# Patient Record
Sex: Male | Born: 1940 | Race: Black or African American | Hispanic: No | Marital: Married | Smoking: Former smoker
Health system: Southern US, Community
[De-identification: ages and names within clinical notes are randomized; demographics above are authoritative.]

## PROBLEM LIST (undated history)

## (undated) DIAGNOSIS — E119 Type 2 diabetes mellitus without complications: Secondary | ICD-10-CM

## (undated) DIAGNOSIS — M109 Gout, unspecified: Secondary | ICD-10-CM

## (undated) DIAGNOSIS — J9600 Acute respiratory failure, unspecified whether with hypoxia or hypercapnia: Secondary | ICD-10-CM

## (undated) DIAGNOSIS — N189 Chronic kidney disease, unspecified: Secondary | ICD-10-CM

## (undated) DIAGNOSIS — N183 Chronic kidney disease, stage 3 unspecified: Secondary | ICD-10-CM

## (undated) DIAGNOSIS — R0600 Dyspnea, unspecified: Secondary | ICD-10-CM

## (undated) DIAGNOSIS — J449 Chronic obstructive pulmonary disease, unspecified: Secondary | ICD-10-CM

## (undated) DIAGNOSIS — I4891 Unspecified atrial fibrillation: Secondary | ICD-10-CM

## (undated) DIAGNOSIS — K2940 Chronic atrophic gastritis without bleeding: Secondary | ICD-10-CM

## (undated) DIAGNOSIS — I509 Heart failure, unspecified: Secondary | ICD-10-CM

## (undated) DIAGNOSIS — I10 Essential (primary) hypertension: Secondary | ICD-10-CM

## (undated) DIAGNOSIS — J320 Chronic maxillary sinusitis: Secondary | ICD-10-CM

## (undated) DIAGNOSIS — K219 Gastro-esophageal reflux disease without esophagitis: Secondary | ICD-10-CM

## (undated) DIAGNOSIS — N179 Acute kidney failure, unspecified: Secondary | ICD-10-CM

## (undated) DIAGNOSIS — T7840XA Allergy, unspecified, initial encounter: Secondary | ICD-10-CM

## (undated) DIAGNOSIS — J45909 Unspecified asthma, uncomplicated: Secondary | ICD-10-CM

## (undated) DIAGNOSIS — K2970 Gastritis, unspecified, without bleeding: Secondary | ICD-10-CM

## (undated) DIAGNOSIS — E079 Disorder of thyroid, unspecified: Secondary | ICD-10-CM

## (undated) HISTORY — DX: Type 2 diabetes mellitus without complications: E11.9

## (undated) HISTORY — DX: Gout, unspecified: M10.9

## (undated) HISTORY — DX: Essential (primary) hypertension: I10

## (undated) HISTORY — DX: Chronic obstructive pulmonary disease, unspecified: J44.9

## (undated) HISTORY — DX: Allergy, unspecified, initial encounter: T78.40XA

## (undated) HISTORY — DX: Unspecified asthma, uncomplicated: J45.909

## (undated) HISTORY — DX: Gastritis, unspecified, without bleeding: K29.70

## (undated) HISTORY — DX: Chronic atrophic gastritis without bleeding: K29.40

## (undated) HISTORY — DX: Gastro-esophageal reflux disease without esophagitis: K21.9

## (undated) HISTORY — DX: Chronic maxillary sinusitis: J32.0

---

## 1978-02-05 HISTORY — PX: HERNIA REPAIR: SHX51

## 1999-08-10 ENCOUNTER — Encounter: Payer: Self-pay | Admitting: Family Medicine

## 1999-08-10 ENCOUNTER — Encounter: Admission: RE | Admit: 1999-08-10 | Discharge: 1999-08-10 | Payer: Self-pay | Admitting: Family Medicine

## 2000-06-03 ENCOUNTER — Inpatient Hospital Stay (HOSPITAL_COMMUNITY): Admission: RE | Admit: 2000-06-03 | Discharge: 2000-06-05 | Payer: Self-pay | Admitting: Urology

## 2000-06-23 ENCOUNTER — Emergency Department (HOSPITAL_COMMUNITY): Admission: EM | Admit: 2000-06-23 | Discharge: 2000-06-23 | Payer: Self-pay | Admitting: Emergency Medicine

## 2000-06-23 ENCOUNTER — Encounter: Payer: Self-pay | Admitting: Emergency Medicine

## 2000-08-03 ENCOUNTER — Emergency Department (HOSPITAL_COMMUNITY): Admission: EM | Admit: 2000-08-03 | Discharge: 2000-08-03 | Payer: Self-pay | Admitting: Emergency Medicine

## 2000-08-20 ENCOUNTER — Encounter: Admission: RE | Admit: 2000-08-20 | Discharge: 2000-08-20 | Payer: Self-pay | Admitting: Urology

## 2000-08-20 ENCOUNTER — Encounter: Payer: Self-pay | Admitting: Urology

## 2000-12-05 ENCOUNTER — Encounter: Payer: Self-pay | Admitting: *Deleted

## 2000-12-05 ENCOUNTER — Encounter: Admission: RE | Admit: 2000-12-05 | Discharge: 2000-12-05 | Payer: Self-pay | Admitting: *Deleted

## 2000-12-23 ENCOUNTER — Encounter: Admission: RE | Admit: 2000-12-23 | Discharge: 2000-12-23 | Payer: Self-pay | Admitting: *Deleted

## 2000-12-23 ENCOUNTER — Encounter: Payer: Self-pay | Admitting: *Deleted

## 2001-01-06 ENCOUNTER — Encounter: Payer: Self-pay | Admitting: Urology

## 2001-01-06 ENCOUNTER — Encounter: Admission: RE | Admit: 2001-01-06 | Discharge: 2001-01-06 | Payer: Self-pay | Admitting: Urology

## 2001-02-05 HISTORY — PX: COLONOSCOPY: SHX174

## 2010-11-03 ENCOUNTER — Encounter: Payer: Self-pay | Admitting: Gastroenterology

## 2010-11-28 ENCOUNTER — Ambulatory Visit: Payer: Self-pay | Admitting: Gastroenterology

## 2011-02-06 LAB — HM COLONOSCOPY

## 2012-06-17 ENCOUNTER — Encounter (HOSPITAL_COMMUNITY): Payer: Self-pay

## 2012-06-17 ENCOUNTER — Emergency Department (HOSPITAL_COMMUNITY)
Admission: EM | Admit: 2012-06-17 | Discharge: 2012-06-17 | Disposition: A | Discharge status: Home / Self Care | Payer: Medicare PPO | Attending: Emergency Medicine | Admitting: Emergency Medicine

## 2012-06-17 ENCOUNTER — Emergency Department (HOSPITAL_COMMUNITY): Payer: Medicare PPO

## 2012-06-17 DIAGNOSIS — Z87891 Personal history of nicotine dependence: Secondary | ICD-10-CM | POA: Insufficient documentation

## 2012-06-17 DIAGNOSIS — Z79899 Other long term (current) drug therapy: Secondary | ICD-10-CM | POA: Insufficient documentation

## 2012-06-17 DIAGNOSIS — J4489 Other specified chronic obstructive pulmonary disease: Secondary | ICD-10-CM | POA: Insufficient documentation

## 2012-06-17 DIAGNOSIS — N183 Chronic kidney disease, stage 3 unspecified: Secondary | ICD-10-CM | POA: Insufficient documentation

## 2012-06-17 DIAGNOSIS — G479 Sleep disorder, unspecified: Secondary | ICD-10-CM | POA: Insufficient documentation

## 2012-06-17 DIAGNOSIS — J329 Chronic sinusitis, unspecified: Secondary | ICD-10-CM | POA: Insufficient documentation

## 2012-06-17 DIAGNOSIS — R1013 Epigastric pain: Secondary | ICD-10-CM

## 2012-06-17 DIAGNOSIS — J449 Chronic obstructive pulmonary disease, unspecified: Secondary | ICD-10-CM | POA: Insufficient documentation

## 2012-06-17 DIAGNOSIS — I129 Hypertensive chronic kidney disease with stage 1 through stage 4 chronic kidney disease, or unspecified chronic kidney disease: Secondary | ICD-10-CM | POA: Insufficient documentation

## 2012-06-17 DIAGNOSIS — K3189 Other diseases of stomach and duodenum: Secondary | ICD-10-CM | POA: Insufficient documentation

## 2012-06-17 HISTORY — DX: Essential (primary) hypertension: I10

## 2012-06-17 LAB — CBC WITH DIFFERENTIAL/PLATELET
Basophils Absolute: 0 10*3/uL (ref 0.0–0.1)
HCT: 42.7 % (ref 39.0–52.0)
Hemoglobin: 13.3 g/dL (ref 13.0–17.0)
Lymphocytes Relative: 21 % (ref 12–46)
Lymphs Abs: 1 10*3/uL (ref 0.7–4.0)
MCV: 95.1 fL (ref 78.0–100.0)
Monocytes Absolute: 0.3 10*3/uL (ref 0.1–1.0)
Neutro Abs: 3.5 10*3/uL (ref 1.7–7.7)
RBC: 4.49 MIL/uL (ref 4.22–5.81)
RDW: 14.2 % (ref 11.5–15.5)
WBC: 5 10*3/uL (ref 4.0–10.5)

## 2012-06-17 LAB — COMPREHENSIVE METABOLIC PANEL
ALT: 22 U/L (ref 0–53)
AST: 34 U/L (ref 0–37)
CO2: 36 mEq/L — ABNORMAL HIGH (ref 19–32)
Chloride: 98 mEq/L (ref 96–112)
Creatinine, Ser: 1.39 mg/dL — ABNORMAL HIGH (ref 0.50–1.35)
GFR calc Af Amer: 56 mL/min — ABNORMAL LOW (ref >90)
GFR calc non Af Amer: 49 mL/min — ABNORMAL LOW (ref >90)
Glucose, Bld: 94 mg/dL (ref 70–99)
Total Bilirubin: 0.6 mg/dL (ref 0.3–1.2)

## 2012-06-17 LAB — URINALYSIS, ROUTINE W REFLEX MICROSCOPIC
Glucose, UA: NEGATIVE mg/dL
Hgb urine dipstick: NEGATIVE
Leukocytes, UA: NEGATIVE
Protein, ur: NEGATIVE mg/dL
Specific Gravity, Urine: 1.021 (ref 1.005–1.030)
pH: 8 (ref 5.0–8.0)

## 2012-06-17 MED ORDER — MONTELUKAST SODIUM 10 MG PO TABS: 10.0000 mg | ORAL_TABLET | Freq: Every day | ORAL | Status: DC

## 2012-06-17 MED ORDER — AMOXICILLIN 500 MG PO CAPS: 500.0000 mg | ORAL_CAPSULE | Freq: Three times a day (TID) | ORAL | Status: DC

## 2012-06-17 MED ORDER — FLUTICASONE PROPIONATE 50 MCG/ACT NA SUSP: 2.0000 | Freq: Every day | NASAL | Status: DC

## 2012-06-17 MED ORDER — ALBUTEROL SULFATE (2.5 MG/3ML) 0.083% IN NEBU: 2.5000 mg | INHALATION_SOLUTION | RESPIRATORY_TRACT | Status: DC | PRN

## 2012-06-17 MED ORDER — TRAZODONE 25 MG HALF TABLET: 50.0000 mg | ORAL_TABLET | Freq: Every day | ORAL | Status: DC

## 2012-06-17 MED ORDER — CHLORTHALIDONE 25 MG PO TABS: 25.0000 mg | ORAL_TABLET | Freq: Every day | ORAL | Status: DC

## 2012-06-17 MED ORDER — BUDESONIDE 0.5 MG/2ML IN SUSP: 0.5000 mg | Freq: Two times a day (BID) | RESPIRATORY_TRACT | Status: DC

## 2012-06-17 NOTE — ED Notes (Signed)
Pt c/o not being able to breath some dizziness, not being able to sleep and constantly belching throughout the night. Family states that the belching has gotten worse. Pt reports dizziness noticed when he walks and bends over. Pt denies chest or abdomen pain or nausea.

## 2012-06-17 NOTE — ED Provider Notes (Signed)
History     CSN: [id number]  Arrival date & time 06/17/12  0520   First MD Initiated Contact with Patient 06/17/12 0557      Chief Complaint  Patient presents with  . GI Problem    (Consider location/radiation/quality/duration/timing/severity/associated sxs/prior treatment) HPI  Omar Lambert is a 72 y.o. male  with past medical history significant for hypertension, COPD, former smoker, complaining of gas that has disturbed his sleep from belching, worsening over the course of the last 4 days. He's had this issue for years. He also has some lightheaded sensation when going from sitting to standing. Patient also reports a mild frontal headache that worsens when he bends forward. Pt has had repeat episodes of sinusitis. This feels like prior exacerbations. Patient denies chest pain, abdominal pain, nausea vomiting, change in bowel or bladder habits, fever. He recently moved here from [place] and has not established primary care. Please note that while nursing triage notes indicates a shortness of breath patient denies this to me he states that his shortness of breath is standard for him at his baseline he is compliant with his COPD medications.  Past Medical History  Diagnosis Date  . Hypertension     History reviewed. No pertinent past surgical history.  No family history on file.  History  Substance Use Topics  . Smoking status: Former Smoker    Types: Cigarettes    Quit date: 06/17/1997  . Smokeless tobacco: Not on file  . Alcohol Use: No      Review of Systems  Constitutional: Negative for fever.  Respiratory: Negative for shortness of breath.   Cardiovascular: Negative for chest pain.  Gastrointestinal: Negative for nausea, vomiting, abdominal pain and diarrhea.  Psychiatric/Behavioral: Positive for sleep disturbance.  All other systems reviewed and are negative.    Allergies  Review of patient's allergies indicates no known allergies.  Home Medications    Current Outpatient Rx  Name  Route  Sig  Dispense  Refill  . albuterol (PROVENTIL) (5 MG/ML) 0.5% nebulizer solution   Nebulization   Take 2.5 mg by nebulization 2 (two) times daily. sob         . budesonide (PULMICORT) 0.5 MG/2ML nebulizer solution   Nebulization   Take 0.5 mg by nebulization 2 (two) times daily.         . chlorthalidone (HYGROTON) 25 MG tablet   Oral   Take 25 mg by mouth daily.         . montelukast (SINGULAIR) 10 MG tablet   Oral   Take 10 mg by mouth at bedtime.         . Multiple Vitamins-Minerals (CENTRUM SILVER ADULT 50+ PO)   Oral   Take 1 tablet by mouth daily.         . sodium chloride (OCEAN) 0.65 % nasal spray   Nasal   Place 1 spray into the nose as needed for congestion (congestion).           BP 155/106  Pulse 57  Temp(Src) 98.2 F (36.8 C) (Oral)  Resp 19  SpO2 93%  Physical Exam  Nursing note and vitals reviewed. Constitutional: He is oriented to person, place, and time. He appears well-developed and well-nourished. No distress.  HENT:  Head: Normocephalic and atraumatic.  Mouth/Throat: Oropharynx is clear and moist.  Mild tenderness to palpation of bilateral frontal sinuses appear  Eyes: Conjunctivae and EOM are normal. Pupils are equal, round, and reactive to light.  Neck: Normal range  of motion. No JVD present. No tracheal deviation present.  Cardiovascular: Normal rate, regular rhythm and intact distal pulses.   Pulmonary/Chest: Effort normal and breath sounds normal. No stridor. No respiratory distress. He has no wheezes. He has no rales. He exhibits no tenderness.  Abdominal: Soft. Bowel sounds are normal. He exhibits no distension and no mass. There is no tenderness. There is no rebound and no guarding.  Musculoskeletal: Normal range of motion. He exhibits no edema.  Neurological: He is alert and oriented to person, place, and time.  Psychiatric: He has a normal mood and affect.    ED Course  Procedures  (including critical care time)  Labs Reviewed - No data to display No results found.   1. Sinusitis   2. Dyspepsia   3. COPD (chronic obstructive pulmonary disease)   4. CKD (chronic kidney disease) stage 3, GFR 30-59 ml/min       MDM   Filed Vitals:   06/17/12 0530 06/17/12 0614 06/17/12 0615 06/17/12 0619  BP: 155/106 141/85 154/95 153/90  Pulse: 57 77 111 79  Temp: 98.2 F (36.8 C)     TempSrc: Oral     Resp: 19     SpO2: 93%        Omar Lambert is a 72 y.o. male with multiple complaints including insomnia secondary to belching discomfort from gas, dizziness, headache. Patient is in no distress, vital signs are known concerning, he does have hypoxia at 93% however the patient has COPD.  Blood work shows elevated CO2 at 36 again in line with his COPD. He is also has what appears to be CK D. with a mildly elevated creatinine and a GFR of 56. Pt is stage III. Patient is not orthostatic by vital signs or by symptomatic response to standing. Acute abdominal series shows normal bowel gas, and also notes mild interstitial prominence which may be chronic versus atypical infection. I doubt that this is infection based on history of present illness and physical.  VSS and patient is appropriate for, and amenable to, discharge at this time. Pt verbalized understanding and agrees with care plan. Outpatient follow-up and return precautions given.    New Prescriptions   ALBUTEROL (PROVENTIL) (2.5 MG/3ML) 0.083% NEBULIZER SOLUTION    Take 3 mLs (2.5 mg total) by nebulization every 4 (four) hours as needed for wheezing.   AMOXICILLIN (AMOXIL) 500 MG CAPSULE    Take 1 capsule (500 mg total) by mouth 3 (three) times daily.   BUDESONIDE (PULMICORT) 0.5 MG/2ML NEBULIZER SOLUTION    Take 2 mLs (0.5 mg total) by nebulization 2 (two) times daily.   CHLORTHALIDONE (HYGROTON) 25 MG TABLET    Take 1 tablet (25 mg total) by mouth daily.   FLUTICASONE (FLONASE) 50 MCG/ACT NASAL SPRAY    Place 2 sprays  into the nose daily.   MONTELUKAST (SINGULAIR) 10 MG TABLET    Take 1 tablet (10 mg total) by mouth at bedtime.   TRAZODONE (DESYREL) 25 MG TABS    Take 1 tablet (50 mg total) by mouth at bedtime. Take 1/2-1 tablet before bedtime for insomnia           Monico Blitz, PA-C 06/17/12 [place], PA-C 06/17/12 [id number]

## 2012-06-17 NOTE — ED Notes (Signed)
Pt currently in radiology. Will stick for labs when pt returns.

## 2012-06-17 NOTE — ED Provider Notes (Signed)
Medical screening examination/treatment/procedure(s) were performed by non-physician practitioner and as supervising physician I was immediately available for consultation/collaboration.   Wynetta Fines, MD 06/17/12 2239

## 2012-09-05 DIAGNOSIS — M109 Gout, unspecified: Secondary | ICD-10-CM | POA: Insufficient documentation

## 2012-09-05 HISTORY — DX: Gout, unspecified: M10.9

## 2012-10-16 ENCOUNTER — Ambulatory Visit (INDEPENDENT_AMBULATORY_CARE_PROVIDER_SITE_OTHER): Payer: Medicare PPO | Admitting: Internal Medicine

## 2012-10-16 ENCOUNTER — Encounter: Payer: Self-pay | Admitting: Internal Medicine

## 2012-10-16 VITALS — BP 154/82 | HR 86 | Temp 98.5°F | Resp 22 | Ht 67.72 in | Wt 265.0 lb

## 2012-10-16 DIAGNOSIS — G4733 Obstructive sleep apnea (adult) (pediatric): Secondary | ICD-10-CM

## 2012-10-16 DIAGNOSIS — T7589XS Other specified effects of external causes, sequela: Secondary | ICD-10-CM

## 2012-10-16 DIAGNOSIS — J45909 Unspecified asthma, uncomplicated: Secondary | ICD-10-CM

## 2012-10-16 DIAGNOSIS — K219 Gastro-esophageal reflux disease without esophagitis: Secondary | ICD-10-CM

## 2012-10-16 DIAGNOSIS — Z9109 Other allergy status, other than to drugs and biological substances: Secondary | ICD-10-CM

## 2012-10-16 DIAGNOSIS — T7840XS Allergy, unspecified, sequela: Secondary | ICD-10-CM

## 2012-10-16 DIAGNOSIS — I10 Essential (primary) hypertension: Secondary | ICD-10-CM

## 2012-10-16 DIAGNOSIS — J329 Chronic sinusitis, unspecified: Secondary | ICD-10-CM

## 2012-10-16 DIAGNOSIS — T7840XA Allergy, unspecified, initial encounter: Secondary | ICD-10-CM | POA: Insufficient documentation

## 2012-10-16 DIAGNOSIS — J320 Chronic maxillary sinusitis: Secondary | ICD-10-CM

## 2012-10-16 DIAGNOSIS — M109 Gout, unspecified: Secondary | ICD-10-CM

## 2012-10-16 DIAGNOSIS — T788XXS Other adverse effects, not elsewhere classified, sequela: Secondary | ICD-10-CM

## 2012-10-16 MED ORDER — ACLIDINIUM BROMIDE 400 MCG/ACT IN AEPB: 1.0000 | INHALATION_SPRAY | Freq: Two times a day (BID) | RESPIRATORY_TRACT | Status: DC

## 2012-10-16 MED ORDER — OMEPRAZOLE 20 MG PO CPDR: 20.0000 mg | DELAYED_RELEASE_CAPSULE | Freq: Every day | ORAL | Status: DC

## 2012-10-16 NOTE — Progress Notes (Signed)
Patient ID: Omar Lambert, male   DOB: [birth year], 72 y.o.   MRN: [id number] Location:  [facility]  Code Status: does not yet have living will or HCPOA   No Known Allergies  Chief Complaint  Patient presents with  . Establish Care    breathing problems, stomach swelling from gas, reflux at night, gout attack in right hand    HPI: Patient is a 72 y.o. black male seen in the office today to establish with the practice.   Insurance company referred him.  Just moved from [facility] and needed to establish with a physician.  Has been to urgent care since coming here.  Is actually from [place] and they've been back 4-5 mos.  Lives with wife--she's coming on Monday.  She's in better health than he--not on meds.    Sometimes has to really take a few big breaths to get his breath if he moves a little.  Has a lot of phlegm that comes up in his throat.  Says he has never really been told what his diagnosis is.  Out of singulair for quite a while.  Has advair and is using it now.  115/21 two puffs bid.  Has pulmicort also and is using.  Has CPAP at night--has obstructive sleep apnea.   Feels tight in the chest sometimes.  No chest pains.  Had pulmonary function tests about a year ago in the summer time.  Had been to ENT in [place].  Did xrays of head and neck.  Had allergy testing and was getting shots there, as well.  Sister also has lung problems, but he is unsure if it is the same problem he has.  Quit smoking many years ago as below.  Has been using chlorthalidone--bp and swelling.  Does get swelling in legs and feet and times.    Had echocardiogram done before.  Has not had cardiac problems.    Used a few indomethacin for his swollen hand.  Still has several left.  Is still not better--does take on and off.    Gets very dizzy if bends down and stands back up.  Feels he will fall over when he ties his shoes.  Head feels funny like sinuses are infected.  Has nasal spray  for a while--doesn't feel like it's doing him much good.  Does have some headache pain.    Is belching and his stomach feels blowed up.  Can happen any time.  Has acid reflux also.  Has not been on medicine for this that he remembers.  Has tried some otc medicine--has used tums.    Right hand started to swell and he was told he had gout.  Has had prior bouts it sounds like that move around place to place--usually lasts only a few days.    Feels his memory is pretty good.     Tells me his whole left arm swelled when he got the flu shot several years ago so hasn't had since.   Did have pneumococcal vaccine before.    Review of Systems:  Review of Systems  Constitutional: Negative for weight loss.       Gained weight gradually;  Past 5-7 years has gained the weight  HENT: Positive for congestion and tinnitus. Negative for hearing loss and sore throat.        Sinus problems, globus sensation  Eyes:       Wears glasses--has eye appt within a couple of weeks  Respiratory: Positive for cough, sputum production, shortness of breath and wheezing.   Cardiovascular: Positive for orthopnea and leg swelling. Negative for chest pain, palpitations, claudication and PND.       Uses his cpap at hs  Gastrointestinal: Positive for heartburn, abdominal pain, blood in stool and melena.       Bloating;  Has had melena and hematochezia years ago--had cscope 2003  Genitourinary: Positive for urgency and frequency.       Couple times per night gets up to [facility] once with cpap usually  Musculoskeletal: Positive for joint pain. Negative for falls.  Skin: Positive for itching and rash.       Right ankle has raised area that he has noticed, not painful  Neurological: Positive for dizziness, tingling, weakness and headaches. Negative for loss of consciousness.       Some tingling of fingers at times  Endo/Heme/Allergies: Positive for environmental allergies.  Psychiatric/Behavioral: Negative for depression  and memory loss. The patient has insomnia. The patient is not nervous/anxious.        Sometimes has trouble--falling asleep primarily.      Past Medical History  Diagnosis Date  . Benign essential hypertension   . Gout attack 09/2012  . Allergy   . Asthma   . Sinusitis, maxillary, chronic     Past Surgical History  Procedure Laterality Date  . Hernia repair  1980  . Colonoscopy  2003    Social History:   reports that he quit smoking about 15 years ago. His smoking use included Cigarettes. He smoked 0.00 packs per day. He does not have any smokeless tobacco history on file. He reports that he does not drink alcohol or use illicit drugs.  Family History  Problem Relation Age of Onset  . Stroke Mother   . Cancer Sister   . COPD Sister   . COPD Sister     Medications: Patient's Medications  New Prescriptions   ACLIDINIUM BROMIDE (TUDORZA PRESSAIR) 400 MCG/ACT AEPB    Inhale 1 puff into the lungs 2 (two) times daily.   OMEPRAZOLE (PRILOSEC) 20 MG CAPSULE    Take 1 capsule (20 mg total) by mouth daily.  Previous Medications   ALBUTEROL (PROVENTIL) (2.5 MG/3ML) 0.083% NEBULIZER SOLUTION    Take 3 mLs (2.5 mg total) by nebulization every 4 (four) hours as needed for wheezing.   CHLORTHALIDONE (HYGROTON) 25 MG TABLET    Take 1 tablet (25 mg total) by mouth daily.   FLUTICASONE (FLONASE) 50 MCG/ACT NASAL SPRAY    Place 2 sprays into the nose daily.   FLUTICASONE-SALMETEROL (ADVAIR HFA) 115-21 MCG/ACT INHALER    Inhale 2 puffs into the lungs 2 (two) times daily.   INDOMETHACIN (INDOCIN) 50 MG CAPSULE    Take 50 mg by mouth 3 (three) times daily with meals.   MONTELUKAST (SINGULAIR) 10 MG TABLET    Take 1 tablet (10 mg total) by mouth at bedtime.   MULTIPLE VITAMINS-MINERALS (CENTRUM SILVER ADULT 50+ PO)    Take 1 tablet by mouth daily.  Modified Medications   No medications on file  Discontinued Medications   ALBUTEROL (PROVENTIL) (5 MG/ML) 0.5% NEBULIZER SOLUTION    Take 2.5 mg by  nebulization 2 (two) times daily. sob   AMOXICILLIN (AMOXIL) 500 MG CAPSULE    Take 1 capsule (500 mg total) by mouth 3 (three) times daily.   BUDESONIDE (PULMICORT) 0.5 MG/2ML NEBULIZER SOLUTION    Take 0.5 mg by nebulization 2 (two) times daily.  BUDESONIDE (PULMICORT) 0.5 MG/2ML NEBULIZER SOLUTION    Take 2 mLs (0.5 mg total) by nebulization 2 (two) times daily.   CHLORTHALIDONE (HYGROTON) 25 MG TABLET    Take 25 mg by mouth daily.   MONTELUKAST (SINGULAIR) 10 MG TABLET    Take 10 mg by mouth at bedtime.   SODIUM CHLORIDE (OCEAN) 0.65 % NASAL SPRAY    Place 1 spray into the nose as needed for congestion (congestion).   TRAZODONE (DESYREL) 25 MG TABS    Take 1 tablet (50 mg total) by mouth at bedtime. Take 1/2-1 tablet before bedtime for insomnia     Physical Exam: Filed Vitals:   10/16/12 0848  BP: 154/82  Pulse: 86  Temp: 98.5 F (36.9 C)  TempSrc: Oral  Resp: 22  Height: 5' 7.72" (1.72 m)  Weight: 265 lb (120.203 kg)  SpO2: 94%  Physical Exam  Constitutional: He is oriented to person, place, and time.  Obese black male, mildly distressed with moving from chair to exam table  HENT:  Head: Normocephalic and atraumatic.  Right Ear: External ear normal.  Left Ear: External ear normal.  Nose: Nose normal.  Mouth/Throat: Oropharynx is clear and moist. No oropharyngeal exudate.  Upper and lower dentures, upper with some gold present  Eyes: Conjunctivae and EOM are normal. Pupils are equal, round, and reactive to light.  Neck: Normal range of motion. Neck supple. No JVD present. No tracheal deviation present. No thyromegaly present.  Cardiovascular: Normal rate, regular rhythm, normal heart sounds and intact distal pulses.  Exam reveals no gallop and no friction rub.   No murmur heard. Pulmonary/Chest: Breath sounds normal. No respiratory distress. He has no wheezes. He has no rales. He exhibits no tenderness.  Increased effort with minimal exertion  Abdominal: Soft. Bowel sounds  are normal. He exhibits no distension and no mass. There is tenderness. No hernia.  Tenderness in periumbilical area  Musculoskeletal: Normal range of motion. He exhibits no edema and no tenderness.  1 + Pitting edema in bilateral lower extremities   Lymphadenopathy:    He has no cervical adenopathy.  Neurological: He is alert and oriented to person, place, and time. No cranial nerve deficit. He exhibits normal muscle tone. Coordination normal.  DTRs absent   Skin: Skin is warm and dry.  Right ankle with raised, dark, hard papule on lateral aspect  Psychiatric: He has a normal mood and affect. His behavior is normal. Judgment and thought content normal.     Labs reviewed: Basic Metabolic Panel:  Recent Labs  06/17/12 0628  NA 139  K 4.2  CL 98  CO2 36*  GLUCOSE 94  BUN 23  CREATININE 1.39*  CALCIUM 9.3   Liver Function Tests:  Recent Labs  06/17/12 0628  AST 34  ALT 22  ALKPHOS 39  BILITOT 0.6  PROT 7.4  ALBUMIN 4.1  CBC:  Recent Labs  06/17/12 0628  WBC 5.0  NEUTROABS 3.5  HGB 13.3  HCT 42.7  MCV 95.1  PLT 185   Past Procedures: Colonoscopy 2003  Assessment/Plan 1. GERD (gastroesophageal reflux disease) - omeprazole (PRILOSEC) 20 MG capsule; Take 1 capsule (20 mg total) by mouth daily.  Dispense: 30 capsule; Refill: 3 - CBC with Differential - Comprehensive metabolic panel  2. Asthma, chronic - need records for pulmonary function tests from [place], [place] - he is unable to say if this was his diagnosis or COPD - he did smoke in the past and has sputum production but this  seems to be more associated with his chronic sinusitis - Aclidinium Bromide (TUDORZA PRESSAIR) 400 MCG/ACT AEPB; Inhale 1 puff into the lungs 2 (two) times daily.  Dispense: 1 each; Refill: 0 -continue Singulair and Advair, may also use albuterol nebs if needed  3. Environmental allergies - likely will need to see [place] allergy for continued injections -need records from prior  allergy practice with detected allergies -cont singulair, may use nasonex, recommended nettipot for sinusitis  4. Obstructive sleep apnea -cont CPAP, will need settings from his records and confirmation that this is his diagnosis not obesity hypoventilation syndrome  5. Sinusitis, chronic - CBC with Differential - obtain records from prior PCP and ENT  6. Severe obesity (BMI >= 40) - Lipid panel; Future - Hemoglobin A1c - TSH - will need to discuss increased exercise if we are able to get his lung disease stabilized  7. Gout -provided dietary recommendations to prevent acute flares--appears he has some active swelling of the right hand at present - Uric Acid level -may need to d/c chlorthalidone due to this  8. Benign essential hypertension -will plan to adjust bp meds next visit and discuss low sodium diet and exercise  Labs/tests ordered:  Cbc, hba1c, TSH, CMP today;  flp before next visit in 6 wks Next appt:  6 wks for EV

## 2012-10-17 LAB — CBC WITH DIFFERENTIAL/PLATELET
Basophils Absolute: 0 10*3/uL (ref 0.0–0.2)
Basos: 0 % (ref 0–3)
Eos: 6 % — ABNORMAL HIGH (ref 0–5)
Eosinophils Absolute: 0.3 10*3/uL (ref 0.0–0.4)
HCT: 38.6 % (ref 37.5–51.0)
Hemoglobin: 12.3 g/dL — ABNORMAL LOW (ref 12.6–17.7)
Immature Grans (Abs): 0 10*3/uL (ref 0.0–0.1)
Immature Granulocytes: 0 % (ref 0–2)
Lymphocytes Absolute: 1 10*3/uL (ref 0.7–3.1)
Lymphs: 20 % (ref 14–46)
MCH: 28.9 pg (ref 26.6–33.0)
MCHC: 31.9 g/dL (ref 31.5–35.7)
MCV: 91 fL (ref 79–97)
Monocytes Absolute: 0.4 10*3/uL (ref 0.1–0.9)
Monocytes: 7 % (ref 4–12)
Neutrophils Absolute: 3.4 10*3/uL (ref 1.4–7.0)
Neutrophils Relative %: 67 % (ref 40–74)
RBC: 4.25 x10E6/uL (ref 4.14–5.80)
RDW: 14.2 % (ref 12.3–15.4)
WBC: 5.1 10*3/uL (ref 3.4–10.8)

## 2012-10-17 LAB — COMPREHENSIVE METABOLIC PANEL
ALT: 18 IU/L (ref 0–44)
AST: 25 IU/L (ref 0–40)
Albumin/Globulin Ratio: 1.6 (ref 1.1–2.5)
Albumin: 4.2 g/dL (ref 3.5–4.8)
Alkaline Phosphatase: 48 IU/L (ref 39–117)
BUN/Creatinine Ratio: 15 (ref 10–22)
BUN: 19 mg/dL (ref 8–27)
CO2: 33 mmol/L — ABNORMAL HIGH (ref 18–29)
Calcium: 9.3 mg/dL (ref 8.6–10.2)
Chloride: 99 mmol/L (ref 97–108)
Creatinine, Ser: 1.31 mg/dL — ABNORMAL HIGH (ref 0.76–1.27)
GFR calc Af Amer: 62 mL/min/{1.73_m2} (ref >59)
GFR calc non Af Amer: 54 mL/min/{1.73_m2} — ABNORMAL LOW (ref >59)
Globulin, Total: 2.6 g/dL (ref 1.5–4.5)
Glucose: 88 mg/dL (ref 65–99)
Potassium: 4.2 mmol/L (ref 3.5–5.2)
Sodium: 144 mmol/L (ref 134–144)
Total Bilirubin: 0.4 mg/dL (ref 0.0–1.2)
Total Protein: 6.8 g/dL (ref 6.0–8.5)

## 2012-10-17 LAB — HEMOGLOBIN A1C
Est. average glucose Bld gHb Est-mCnc: 143 mg/dL
Hgb A1c MFr Bld: 6.6 % — ABNORMAL HIGH (ref 4.8–5.6)

## 2012-10-17 LAB — URIC ACID: Uric Acid: 9.3 mg/dL — ABNORMAL HIGH (ref 3.7–8.6)

## 2012-10-17 LAB — TSH: TSH: 3.27 u[IU]/mL (ref 0.450–4.500)

## 2012-10-20 ENCOUNTER — Other Ambulatory Visit: Payer: Self-pay | Admitting: Internal Medicine

## 2012-10-20 DIAGNOSIS — I10 Essential (primary) hypertension: Secondary | ICD-10-CM

## 2012-10-20 DIAGNOSIS — M109 Gout, unspecified: Secondary | ICD-10-CM

## 2012-10-20 MED ORDER — CHLORTHALIDONE 25 MG PO TABS: 25.0000 mg | ORAL_TABLET | Freq: Every day | ORAL | Status: DC

## 2012-10-20 MED ORDER — MONTELUKAST SODIUM 10 MG PO TABS: 10.0000 mg | ORAL_TABLET | Freq: Every day | ORAL | Status: DC

## 2012-10-20 MED ORDER — COLCHICINE 0.6 MG PO TABS: 0.6000 mg | ORAL_TABLET | Freq: Every day | ORAL | Status: DC

## 2012-10-20 MED ORDER — FEBUXOSTAT 40 MG PO TABS: 40.0000 mg | ORAL_TABLET | Freq: Every day | ORAL | Status: DC

## 2012-10-21 ENCOUNTER — Other Ambulatory Visit: Payer: Self-pay | Admitting: *Deleted

## 2012-10-21 ENCOUNTER — Other Ambulatory Visit: Payer: Commercial Managed Care - HMO

## 2012-10-21 DIAGNOSIS — Z1211 Encounter for screening for malignant neoplasm of colon: Secondary | ICD-10-CM

## 2012-10-22 LAB — FECAL OCCULT BLOOD, IMMUNOCHEMICAL: Fecal Occult Bld: POSITIVE — AB

## 2012-10-23 ENCOUNTER — Telehealth: Payer: Self-pay

## 2012-10-23 DIAGNOSIS — R195 Other fecal abnormalities: Secondary | ICD-10-CM

## 2012-10-23 NOTE — Telephone Encounter (Signed)
Message copied by Logan Bores on Thu Oct 23, 2012 11:29 AM ------      Message from: [name] L      Created: Thu Oct 23, 2012  8:38 AM       Still need old records on him.  I recommend he be seen by GI here for colonoscopy if his last one has not been within the last 3 years.  Please ask him or his wife.  I would send him to [facility]. ------

## 2012-10-23 NOTE — Telephone Encounter (Signed)
Discussed with patient's wife also

## 2012-10-23 NOTE — Telephone Encounter (Signed)
Discussed results with patient, patient thinks his last colonoscopy was 3 year ago. Patient not sure if wife gave all information to request records, patient will have wife call when available. Per Dr.[name] place order for GI referral for positive hemoccult.

## 2012-10-24 ENCOUNTER — Encounter: Payer: Self-pay | Admitting: Gastroenterology

## 2012-11-18 ENCOUNTER — Encounter: Payer: Self-pay | Admitting: Gastroenterology

## 2012-11-18 ENCOUNTER — Other Ambulatory Visit (INDEPENDENT_AMBULATORY_CARE_PROVIDER_SITE_OTHER): Payer: Medicare PPO

## 2012-11-18 ENCOUNTER — Ambulatory Visit (INDEPENDENT_AMBULATORY_CARE_PROVIDER_SITE_OTHER): Payer: Medicare PPO | Admitting: Gastroenterology

## 2012-11-18 ENCOUNTER — Other Ambulatory Visit: Payer: Medicare PPO

## 2012-11-18 VITALS — BP 138/80 | HR 78 | Ht 67.0 in | Wt 256.0 lb

## 2012-11-18 DIAGNOSIS — K5900 Constipation, unspecified: Secondary | ICD-10-CM

## 2012-11-18 DIAGNOSIS — K219 Gastro-esophageal reflux disease without esophagitis: Secondary | ICD-10-CM

## 2012-11-18 DIAGNOSIS — I10 Essential (primary) hypertension: Secondary | ICD-10-CM

## 2012-11-18 DIAGNOSIS — I509 Heart failure, unspecified: Secondary | ICD-10-CM

## 2012-11-18 DIAGNOSIS — G4733 Obstructive sleep apnea (adult) (pediatric): Secondary | ICD-10-CM

## 2012-11-18 DIAGNOSIS — J449 Chronic obstructive pulmonary disease, unspecified: Secondary | ICD-10-CM

## 2012-11-18 DIAGNOSIS — I5042 Chronic combined systolic (congestive) and diastolic (congestive) heart failure: Secondary | ICD-10-CM

## 2012-11-18 LAB — CBC WITH DIFFERENTIAL/PLATELET
Basophils Relative: 0.6 % (ref 0.0–3.0)
Eosinophils Absolute: 0.2 10*3/uL (ref 0.0–0.7)
Eosinophils Relative: 5.6 % — ABNORMAL HIGH (ref 0.0–5.0)
HCT: 40.8 % (ref 39.0–52.0)
Hemoglobin: 13.3 g/dL (ref 13.0–17.0)
Lymphs Abs: 1.2 10*3/uL (ref 0.7–4.0)
MCHC: 32.5 g/dL (ref 30.0–36.0)
MCV: 90.5 fl (ref 78.0–100.0)
Monocytes Absolute: 0.6 10*3/uL (ref 0.1–1.0)
Monocytes Relative: 15.1 % — ABNORMAL HIGH (ref 3.0–12.0)
Neutro Abs: 1.7 10*3/uL (ref 1.4–7.7)
Neutrophils Relative %: 45.3 % (ref 43.0–77.0)
Platelets: 176 10*3/uL (ref 150.0–400.0)
RBC: 4.51 Mil/uL (ref 4.22–5.81)
RDW: 14.9 % — ABNORMAL HIGH (ref 11.5–14.6)
WBC: 3.7 10*3/uL — ABNORMAL LOW (ref 4.5–10.5)

## 2012-11-18 LAB — HEPATIC FUNCTION PANEL
ALT: 24 U/L (ref 0–53)
AST: 35 U/L (ref 0–37)
Albumin: 4 g/dL (ref 3.5–5.2)
Alkaline Phosphatase: 32 U/L — ABNORMAL LOW (ref 39–117)
Bilirubin, Direct: 0.1 mg/dL (ref 0.0–0.3)
Total Bilirubin: 0.8 mg/dL (ref 0.3–1.2)
Total Protein: 7.2 g/dL (ref 6.0–8.3)

## 2012-11-18 LAB — BASIC METABOLIC PANEL
BUN: 14 mg/dL (ref 6–23)
CO2: 35 mEq/L — ABNORMAL HIGH (ref 19–32)
Calcium: 9.5 mg/dL (ref 8.4–10.5)
Glucose, Bld: 87 mg/dL (ref 70–99)
Potassium: 3.8 mEq/L (ref 3.5–5.1)
Sodium: 140 mEq/L (ref 135–145)

## 2012-11-18 LAB — IBC PANEL: Saturation Ratios: 18.2 % — ABNORMAL LOW (ref 20.0–50.0)

## 2012-11-18 MED ORDER — ESOMEPRAZOLE MAGNESIUM 40 MG PO CPDR: 40.0000 mg | DELAYED_RELEASE_CAPSULE | Freq: Every day | ORAL | Status: DC

## 2012-11-18 NOTE — Progress Notes (Signed)
History of Present Illness:  This is a 72 year old African American male referred by [name] [name],DO cause for guaiac positive stool Hemoccult testing.  I have no other notes concern in his care, medications her labs.  This patient has severe COPD and is short of breath on interview.  He is accompanied by his wife.  GI-wise he complains of gas, bloating, irritation, acid reflux, chronic constipation, but no melena or hematochezia.  He is on a CPAP machine at night, and has rather severe edema of his lower extremities and shortness of breath with walking and lying flat.  I can see on reviewing his record he has not had pulmonary or cardiology evaluations, but he is on various inhalers.  He also takes daily diuretics, colchicine, and other medications for gout.  He is on Prilosec 20 mg a day for his acid reflux.  He apparently is had colonoscopy in [place] 3 years ago which was unremarkable.  This report is not available for review.  He has not had previous endoscopy.  There is no history of dysphagia, melena or hematochezia.  He denies a current neurological problems.  He denies current use of alcohol, cigarettes, or NSAIDs.  I have reviewed this patient's present history, medical and surgical past history, allergies and medications.     ROS:   All systems were reviewed and are negative unless otherwise stated in the HPI.    Physical Exam: Obese uncomfortable patient very short of breath at rest but in no severe acute distress.  Blood pressure 130/80, pulse 78, and weight 256 with a BMI of 40.09. General well developed well nourished patient in no acute distress, appearing their stated age Eyes PERRLA, no icterus, fundoscopic exam per opthamologist Skin no lesions noted Neck supple, no adenopathy, no thyroid enlargement, no tenderness,+JVD Chest : Wheezes and rhonchi on both lung fields with diminished breath sounds. Heart no significant murmurs, gallops or rubs noted Abdomen no  hepatosplenomegaly masses or tenderness, BS normal.  Massively obese and protuberant abdomen I can exam very difficult.  He appears to have an enlarged liver with positive HJ. Reflux.. probable ascites present. Rectal inspection normal no fissures, or fistulae noted.  No masses or tenderness on digital exam. Stool guaiac negative.  There is some rectal stenosis present, but there is no bloody stool in the rectal vault. Extremities no acute joint lesions, tense edema of both lower extremities with +2 pitting edema Neurologic patient oriented x 3, cranial nerves intact, no focal neurologic deficits noted. Psychological mental status normal and normal affect.  Assessment and plan: As patient has severe COPD and what appears to be associated pulmonary hypertension.  He has not, not a candidate for conscious sedation and any type of endoscopic procedure, especially EGD.  He has chronic acid reflux without dysphagia, and I have changed her from Prilosec to Dexilant 60 mg a day.  He needs pulmonary and cardiology consultation for better management of his COPD, congestive heart failure, and possible secondary congestive hepatic cirrhosis.  I have ordered labs for review, and schedule appropriate consultations.  Have also ordered a baseline CT scan of the abdomen and pelvis.  This patient may need hospitalization for further workup.  I am not sure that guaiac positive stool is of main concern on this patient less CT scan shows some specific abnormality since he's had fairly recent colonoscopy which was unremarkable and as multiple reasons to have a guaiac positive stool although I but could not confirm this.  He needs  primary medical attention before any GI evaluation otherwise is completed.  History is also positive for chronic sleep apnea.

## 2012-11-18 NOTE — Patient Instructions (Addendum)
You have been scheduled for a CT scan of the abdomen and pelvis at [facility] ([address]---this is in the same building as [facility]).   You are scheduled on 11-21-2012 at 9 am. You should arrive 15 minutes prior to your appointment time for registration. Please follow the written instructions below on the day of your exam:  WARNING: IF YOU ARE ALLERGIC TO IODINE/X-RAY DYE, PLEASE NOTIFY RADIOLOGY IMMEDIATELY AT [phone]! YOU WILL BE GIVEN A 13 HOUR PREMEDICATION PREP.  1) Do not eat or drink anything after 5 am (4 hours prior to your test) 2) You have been given 2 bottles of oral contrast to drink. The solution may taste better if refrigerated, but do NOT add ice or any other liquid to this solution. Shake well before drinking.    Drink 1 bottle of contrast @ 7 am (2 hours prior to your exam)  Drink 1 bottle of contrast @ 8 am (1 hour prior to your exam)  You may take any medications as prescribed with a small amount of water except for the following: Metformin, Glucophage, Glucovance, Avandamet, Riomet, Fortamet, Actoplus Met, Janumet, Glumetza or Metaglip. The above medications must be held the day of the exam AND 48 hours after the exam.  The purpose of you drinking the oral contrast is to aid in the visualization of your intestinal tract. The contrast solution may cause some diarrhea. Before your exam is started, you will be given a small amount of fluid to drink. Depending on your individual set of symptoms, you may also receive an intravenous injection of x-ray contrast/dye. Plan on being at [facility] for 30 minutes or long, depending on the type of exam you are having performed.  This test typically takes 30-45 minutes to complete.  If you have any questions regarding your exam or if you need to reschedule, you may call the CT department at [phone] between the hours of 8:00 am and 5:00 pm,  Monday-Friday.  ______________________________________________________________________________________________________________________________________________________________________________________________  Your physician has requested that you go to the basement for the following lab work before leaving today: BMP Anemia Panel TSH CBC Liver Function Panel    Please stop Omeprazole and start Nexium 40 mg, please take one capsule by mouth once daily.   You have an appointment with Pulmonary on our 2nd floor of this building on 11-24-2012 at 10:45 am  You have an appointment with [facility] on 11-25-2012 at 345 pm ([place])

## 2012-11-19 LAB — VITAMIN B12: Vitamin B-12: 261 pg/mL (ref 211–911)

## 2012-11-20 ENCOUNTER — Telehealth: Payer: Self-pay | Admitting: *Deleted

## 2012-11-20 DIAGNOSIS — R140 Abdominal distension (gaseous): Secondary | ICD-10-CM

## 2012-11-20 DIAGNOSIS — K625 Hemorrhage of anus and rectum: Secondary | ICD-10-CM

## 2012-11-20 DIAGNOSIS — K219 Gastro-esophageal reflux disease without esophagitis: Secondary | ICD-10-CM

## 2012-11-20 NOTE — Telephone Encounter (Signed)
Spoke with nurse at [facility] for approval of CT Scan scheduled in am. The nurse stated if pt had had an U/S or AFP level done it could have been approved.  CT was r/s to 11/25/12 hoping we will have approval after Dr Sharlett Iles does a peer to peer. Someone from [facility] will call Dr Sharlett Iles within 24-48 hours.

## 2012-11-21 ENCOUNTER — Other Ambulatory Visit: Payer: Medicare PPO

## 2012-11-21 NOTE — Telephone Encounter (Signed)
Ultrasound is ok

## 2012-11-21 NOTE — Telephone Encounter (Signed)
Informed pt insurance will not pay for his CT scan and Dr Sharlett Iles has ordered an U/S. Pt was riding and could not write down the appt; I will call this pm after his wife gets home from work. Pt stated understanding.  Mailed pt a letter with appt and asked Dr Gustavus Bryant ofc to print my letter in case the pt doesn't receive it in time.

## 2012-11-21 NOTE — Telephone Encounter (Signed)
Pt was home and had something to write with so I gave him info on his CT scan; he stated understanding.

## 2012-11-24 ENCOUNTER — Encounter: Payer: Self-pay | Admitting: Internal Medicine

## 2012-11-24 ENCOUNTER — Ambulatory Visit (INDEPENDENT_AMBULATORY_CARE_PROVIDER_SITE_OTHER): Payer: Medicare PPO | Admitting: Internal Medicine

## 2012-11-24 VITALS — BP 128/80 | HR 78 | Temp 98.4°F | Ht 68.0 in | Wt 254.0 lb

## 2012-11-24 DIAGNOSIS — J449 Chronic obstructive pulmonary disease, unspecified: Secondary | ICD-10-CM

## 2012-11-24 MED ORDER — FLUTICASONE FUROATE-VILANTEROL 100-25 MCG/INH IN AEPB: 1.0000 | INHALATION_SPRAY | Freq: Every morning | RESPIRATORY_TRACT | Status: DC

## 2012-11-24 NOTE — Patient Instructions (Addendum)
Breo one puff each am and stop all other respiratory medications  Only use your albuterol as a rescue medication to be used if you can't catch your breath by resting or doing a relaxed purse lip breathing pattern.  - The less you use it, the better it will work when you need it. - Ok to use up to every 4 hours if you must but call for immediate appointment if use goes up over your usual need  Bring all active medications to each visit separated into two bags : the ones you take automatically vs the ones you take as needed  Please schedule a follow up office visit in 4 weeks, sooner if needed with pfts  And cxr on return

## 2012-11-24 NOTE — Progress Notes (Signed)
  Subjective:    Patient ID: Omar Lambert, male    DOB: 1940-07-22   MRN: [id number]  HPI  57 yobm quit smoking 1999 at wt 200 lb with onset of breathing problems around 2005 but worse since so referred by [name] for evaluation of copd   11/24/2012 1st [facility] office visit/ Wert cc indolent onset progresssively worsening doe to point where only goes about  100 ft ? No better p saba no cough, breathing worse bending over. No better on advair hfa  No obvious day to day or daytime variabilty or assoc chronic cough or cp or chest tightness, subjective wheeze overt   hb symptoms. No unusual exp hx or h/o childhood pna/ asthma or knowledge of premature birth.  Sleeping ok without nocturnal  or early am exacerbation  of respiratory  c/o's or need for noct saba. Also denies any obvious fluctuation of symptoms with weather or environmental changes or other aggravating or alleviating factors except as outlined above   Current Medications, Allergies, Complete Past Medical History, Past Surgical History, Family History, and Social History were reviewed in [employer] record.         Review of Systems  Constitutional: Negative for fever and unexpected weight change.  HENT: Positive for sinus pressure. Negative for congestion, dental problem, ear pain, nosebleeds, postnasal drip, rhinorrhea, sneezing, sore throat and trouble swallowing.   Eyes: Negative for redness and itching.  Respiratory: Positive for shortness of breath. Negative for cough, chest tightness and wheezing.   Cardiovascular: Positive for leg swelling. Negative for palpitations.  Gastrointestinal: Negative for nausea and vomiting.  Genitourinary: Negative for dysuria.  Musculoskeletal: Negative for joint swelling.  Skin: Negative for rash.  Neurological: Positive for headaches.  Hematological: Does not bruise/bleed easily.  Psychiatric/Behavioral: Positive for dysphoric mood. The patient is  nervous/anxious.        Objective:   Physical Exam  Wt Readings from Last 3 Encounters:  11/24/12 254 lb (115.214 kg)  11/18/12 256 lb (116.121 kg)  10/16/12 265 lb (120.203 kg)     Full dentures  HEENT mild turbinate edema.  Oropharynx no thrush or excess pnd or cobblestoning.  No JVD or cervical adenopathy. Mild accessory muscle hypertrophy. Trachea midline, nl thryroid. Chest was hyperinflated by percussion with diminished breath sounds and moderate increased exp time without wheeze. Hoover sign positive at mid inspiration. Regular rate and rhythm without murmur gallop or rub or increase P2 but 1-2 plus sym bilateral lower ext edema.  Abd: no hsm, nl excursion. Ext warm without cyanosis or clubbing.         Assessment & Plan:

## 2012-11-25 ENCOUNTER — Ambulatory Visit (HOSPITAL_COMMUNITY)
Admission: RE | Admit: 2012-11-25 | Discharge: 2012-11-25 | Disposition: A | Discharge status: Home / Self Care | Payer: Medicare PPO | Source: Ambulatory Visit | Attending: Gastroenterology | Admitting: Gastroenterology

## 2012-11-25 ENCOUNTER — Other Ambulatory Visit: Payer: Medicare PPO

## 2012-11-25 ENCOUNTER — Ambulatory Visit (INDEPENDENT_AMBULATORY_CARE_PROVIDER_SITE_OTHER): Payer: Medicare PPO | Admitting: Cardiovascular Disease

## 2012-11-25 VITALS — BP 110/80 | HR 75 | Ht 67.0 in | Wt 251.8 lb

## 2012-11-25 DIAGNOSIS — R0602 Shortness of breath: Secondary | ICD-10-CM

## 2012-11-25 DIAGNOSIS — R141 Gas pain: Secondary | ICD-10-CM | POA: Insufficient documentation

## 2012-11-25 DIAGNOSIS — Z79899 Other long term (current) drug therapy: Secondary | ICD-10-CM

## 2012-11-25 DIAGNOSIS — G4733 Obstructive sleep apnea (adult) (pediatric): Secondary | ICD-10-CM

## 2012-11-25 DIAGNOSIS — K625 Hemorrhage of anus and rectum: Secondary | ICD-10-CM

## 2012-11-25 DIAGNOSIS — N281 Cyst of kidney, acquired: Secondary | ICD-10-CM | POA: Insufficient documentation

## 2012-11-25 DIAGNOSIS — Z13220 Encounter for screening for lipoid disorders: Secondary | ICD-10-CM

## 2012-11-25 DIAGNOSIS — M7989 Other specified soft tissue disorders: Secondary | ICD-10-CM

## 2012-11-25 DIAGNOSIS — R142 Eructation: Secondary | ICD-10-CM | POA: Insufficient documentation

## 2012-11-25 DIAGNOSIS — K219 Gastro-esophageal reflux disease without esophagitis: Secondary | ICD-10-CM

## 2012-11-25 DIAGNOSIS — R140 Abdominal distension (gaseous): Secondary | ICD-10-CM

## 2012-11-25 MED ORDER — TORSEMIDE 20 MG PO TABS: ORAL_TABLET | ORAL | Status: DC

## 2012-11-25 NOTE — Patient Instructions (Addendum)
Your physician has recommended you make the following change in your medication: start new prescription for torsemide as directed. This has already been sent to the pharmacy. STOP the chlorthalidone.  Your physician has requested that you have an echocardiogram. Echocardiography is a painless test that uses sound waves to create images of your heart. It provides your doctor with information about the size and shape of your heart and how well your heart's chambers and valves are working. This procedure takes approximately one hour. There are no restrictions for this procedure.   Your physician recommends that you return for lab work in: [place].  Your physician has requested that you have a lower extremity arterial exercise duplex. During this test, exercise and ultrasound are used to evaluate arterial blood flow in the legs. Allow one hour for this exam. There are no restrictions or special instructions.  Your physician recommends that you schedule a follow-up appointment in: 4 WEEKS

## 2012-11-26 DIAGNOSIS — J441 Chronic obstructive pulmonary disease with (acute) exacerbation: Secondary | ICD-10-CM | POA: Insufficient documentation

## 2012-11-26 NOTE — Assessment & Plan Note (Addendum)
DDX of  difficult airways managment all start with A and  include Adherence, Ace Inhibitors, Acid Reflux, Active Sinus Disease, Alpha 1 Antitripsin deficiency, Anxiety masquerading as Airways dz,  ABPA,  allergy(esp in young), Aspiration (esp in elderly), Adverse effects of DPI,  Active smokers, plus two Bs  = Bronchiectasis and Beta blocker use..and one C= CHF  Adherence is always the initial "prime suspect" and is a multilayered concern that requires a "trust but verify" approach in every patient - starting with knowing how to use medications, especially inhalers, correctly, keeping up with refills and understanding the fundamental difference between maintenance and prns vs those medications only taken for a very short course and then stopped and not refilled. I have asked him to start by separating maint vs prn   He does not appear to know his resp meds well at all re maint vs prns so the simpler the better to maintain adherence: try breo qam and prn saba only.  ? Acid (or non-acid) GERD > always difficult to exclude as up to 75% of pts in some series report no assoc GI/ Heartburn symptoms> rec continue  max (24h)  acid suppression and diet restrictions/ reviewed    ? CHF > cards eval in progress    Each maintenance medication was reviewed in detail including most importantly the difference between maintenance and as needed and under what circumstances the prns are to be used.  Please see instructions for details which were reviewed in writing and the patient given a copy.

## 2012-11-27 ENCOUNTER — Other Ambulatory Visit: Payer: Medicare PPO

## 2012-12-01 ENCOUNTER — Encounter: Payer: Medicare PPO | Admitting: Internal Medicine

## 2012-12-03 ENCOUNTER — Ambulatory Visit (HOSPITAL_COMMUNITY)
Admission: RE | Admit: 2012-12-03 | Discharge: 2012-12-03 | Disposition: A | Discharge status: Home / Self Care | Payer: Medicare HMO | Source: Ambulatory Visit | Attending: Cardiovascular Disease | Admitting: Cardiovascular Disease

## 2012-12-03 ENCOUNTER — Other Ambulatory Visit (HOSPITAL_COMMUNITY): Payer: Self-pay | Admitting: Cardiovascular Disease

## 2012-12-03 ENCOUNTER — Other Ambulatory Visit: Payer: Self-pay | Admitting: *Deleted

## 2012-12-03 DIAGNOSIS — M79609 Pain in unspecified limb: Secondary | ICD-10-CM | POA: Insufficient documentation

## 2012-12-03 DIAGNOSIS — M7989 Other specified soft tissue disorders: Secondary | ICD-10-CM

## 2012-12-03 NOTE — Progress Notes (Signed)
Lower Ext. Venous Duplex Completed. Negative for DVT - Positive for bilateral deep venous insufficiency. Oda Cogan, BS, RDMS, RVT

## 2012-12-08 ENCOUNTER — Telehealth: Payer: Self-pay | Admitting: *Deleted

## 2012-12-08 NOTE — Telephone Encounter (Signed)
Faxed sleep studies and office notes from Dr. Garnet Koyanagi in [place], [place].

## 2012-12-12 ENCOUNTER — Encounter: Payer: Self-pay | Admitting: Cardiovascular Disease

## 2012-12-17 ENCOUNTER — Ambulatory Visit (HOSPITAL_COMMUNITY)
Admission: RE | Admit: 2012-12-17 | Discharge: 2012-12-17 | Disposition: A | Discharge status: Home / Self Care | Payer: Medicare HMO | Source: Ambulatory Visit | Attending: Cardiology | Admitting: Cardiology

## 2012-12-17 DIAGNOSIS — I379 Nonrheumatic pulmonary valve disorder, unspecified: Secondary | ICD-10-CM | POA: Insufficient documentation

## 2012-12-17 DIAGNOSIS — R0989 Other specified symptoms and signs involving the circulatory and respiratory systems: Secondary | ICD-10-CM | POA: Insufficient documentation

## 2012-12-17 DIAGNOSIS — R0602 Shortness of breath: Secondary | ICD-10-CM

## 2012-12-17 DIAGNOSIS — R609 Edema, unspecified: Secondary | ICD-10-CM | POA: Insufficient documentation

## 2012-12-17 DIAGNOSIS — I079 Rheumatic tricuspid valve disease, unspecified: Secondary | ICD-10-CM | POA: Insufficient documentation

## 2012-12-17 DIAGNOSIS — R0609 Other forms of dyspnea: Secondary | ICD-10-CM | POA: Insufficient documentation

## 2012-12-17 NOTE — Progress Notes (Signed)
2D Echo Performed 12/17/2012    Marygrace Drought, RCS

## 2012-12-22 ENCOUNTER — Encounter: Payer: Self-pay | Admitting: Cardiovascular Disease

## 2012-12-22 DIAGNOSIS — G4733 Obstructive sleep apnea (adult) (pediatric): Secondary | ICD-10-CM | POA: Insufficient documentation

## 2012-12-22 NOTE — Progress Notes (Signed)
Patient ID: Omar Lambert, male   DOB: [birth year], 72 y.o.   MRN: [id number]     PATIENT PROFILE: [name] is a 72 year old African American male with history of severe COPD, dyspnea GERD, as well as obstructive sleep apnea he presents for evaluation of shortness of breath and cardiac status.   HPI: Mr. Church has remote tobacco history having quit in 1999. He recently had returned to [place] and living in [place]. Apparently, he has seen Dr. [name] with complaints of gas, abdominal bloating, acid reflux, chronic constipation without melena or hematochezia. There was some concern about potential hepatic congestion from cardiac status. He has seen Dr. Melvyn Novas for evaluation of his severe COPD. Patient admits to shortness of breath with minimal activity. He has a history of obstructive sleep apnea but admits to only intermittently using CPAP therapy. He denies any chest pressure. He denies any awareness of significant cardiac arrhythmias, presyncope or syncope.  Past Medical History  Diagnosis Date  . Benign essential hypertension   . Gout attack 09/2012  . Allergy   . Asthma   . Sinusitis, maxillary, chronic     Past Surgical History  Procedure Laterality Date  . Hernia repair  1980  . Colonoscopy  2003    No Known Allergies  Current Outpatient Prescriptions  Medication Sig Dispense Refill  . ADVAIR HFA 115-21 MCG/ACT inhaler Inhale 2 puffs into the lungs daily.      Marland Kitchen albuterol (PROVENTIL) (2.5 MG/3ML) 0.083% nebulizer solution Take 3 mLs (2.5 mg total) by nebulization every 4 (four) hours as needed for wheezing.  30 vial  1  . colchicine 0.6 MG tablet Take 1 tablet (0.6 mg total) by mouth daily.  30 tablet  3  . esomeprazole (NEXIUM) 40 MG capsule Take 1 capsule (40 mg total) by mouth daily before breakfast.  30 capsule  6  . febuxostat (ULORIC) 40 MG tablet Take 1 tablet (40 mg total) by mouth daily.  30 tablet  3  . fluticasone (FLONASE) 50 MCG/ACT nasal  spray Place 2 sprays into the nose daily.  16 g  0  . Fluticasone Furoate-Vilanterol (BREO ELLIPTA) 100-25 MCG/INH AEPB Inhale 1 puff into the lungs every morning.      . montelukast (SINGULAIR) 10 MG tablet Take 1 tablet (10 mg total) by mouth at bedtime.  30 tablet  3  . Multiple Vitamins-Minerals (CENTRUM SILVER ADULT 50+ PO) Take 1 tablet by mouth daily.      Marland Kitchen torsemide (DEMADEX) 20 MG tablet Take 1 tablet twice daily  60 tablet  6   No current facility-administered medications for this visit.    Social history is notable in that he is married for 55 years. He has 3 children 8 grandchildren 6 great-grandchildren. He is retired from [employer]. He completed ninth grade education. He smoked until 1999.  Family History  Problem Relation Age of Onset  . Stroke Mother   . Cancer Sister   . Diabetes Sister   . COPD Sister   . COPD Sister     ROS is negative for fever chills or night sweats. He denies skin changes. At times he admits to have having sinus pressure. He does have dentures he denies cough. He denies recent wheezing. He denies chest pressure. He is unaware of any arrhythmias. At times he notes abdominal bloating. He denies recent awareness of blood in the stool. He denies GU symptoms. He admits to  leg swelling. He does experience occasional  headaches. He denies bleeding. From a sleep prospective he typically goes to bed at 9 PM and wakes up at 4 AM. He is unaware of any restless legs. He denies heat or cold intolerance. There is no history of diabetes. Other comprehensive 14 point system review is negative.  PE BP 110/80  Pulse 75  Ht 5\' 7"  (1.702 m)  Wt 251 lb 12.8 oz (114.216 kg)  BMI 39.43 kg/m2 General: Alert, oriented, no distress.  Skin: normal turgor, no rashes HEENT: Normocephalic, atraumatic. Pupils round and reactive; sclera anicteric; Fundi mild arteriolar narrowing. No hemorrhages or exudates. Nose without nasal septal hypertrophy Mouth/Parynx benign;  Mallinpatti scale 4 Neck: No JVD, no carotid bruits Lungs: clear to ausculatation and percussion; no wheezing or rales Heart: RRR, s1 s2 normal  Abdomen: Diastases recti; soft, nontender; no hepatosplenomehaly, BS+; abdominal aorta nontender and not dilated by palpation. Pulses 2+ Extremities: 1+ lower extremity edema no clubbinbg cyanosis, Homan's sign negative  Neurologic: grossly nonfocal Psychologic: Mild nervousness   ECG: Sinus rhythm with first-degree AV block with occasional PVC. The patient is entirely asymptomatic with reference to this ectopy.  LABS:  BMET    Component Value Date/Time   NA 140 11/18/2012 1459   NA 144 10/16/2012 1047   K 3.8 11/18/2012 1459   CL 98 11/18/2012 1459   CO2 35* 11/18/2012 1459   GLUCOSE 87 11/18/2012 1459   GLUCOSE 88 10/16/2012 1047   BUN 14 11/18/2012 1459   BUN 19 10/16/2012 1047   CREATININE 1.3 11/18/2012 1459   CALCIUM 9.5 11/18/2012 1459   GFRNONAA 54* 10/16/2012 1047   GFRAA 62 10/16/2012 1047     Hepatic Function Panel     Component Value Date/Time   PROT 7.2 11/18/2012 1459   PROT 6.8 10/16/2012 1047   ALBUMIN 4.0 11/18/2012 1459   AST 35 11/18/2012 1459   ALT 24 11/18/2012 1459   ALKPHOS 32* 11/18/2012 1459   BILITOT 0.8 11/18/2012 1459   BILIDIR 0.1 11/18/2012 1459     CBC    Component Value Date/Time   WBC 3.7* 11/18/2012 1459   WBC 5.1 10/16/2012 1047   RBC 4.51 11/18/2012 1459   RBC 4.25 10/16/2012 1047   HGB 13.3 11/18/2012 1459   HCT 40.8 11/18/2012 1459   PLT 176.0 11/18/2012 1459   MCV 90.5 11/18/2012 1459   MCH 28.9 10/16/2012 1047   MCH 29.6 06/17/2012 0628   MCHC 32.5 11/18/2012 1459   MCHC 31.9 10/16/2012 1047   RDW 14.9* 11/18/2012 1459   RDW 14.2 10/16/2012 1047   LYMPHSABS 1.2 11/18/2012 1459   LYMPHSABS 1.0 10/16/2012 1047   MONOABS 0.6 11/18/2012 1459   EOSABS 0.2 11/18/2012 1459   EOSABS 0.3 10/16/2012 1047   BASOSABS 0.0 11/18/2012 1459   BASOSABS 0.0 10/16/2012 1047     BNP No results  found for this basename: probnp    Lipid Panel  No results found for this basename: chol, trig, hdl, cholhdl, vldl, ldlcalc     RADIOLOGY: US Abdomen Complete  11/25/2012   CLINICAL DATA:  Abdominal distention  EXAM: ULTRASOUND ABDOMEN COMPLETE  COMPARISON:  None.  FINDINGS: Gallbladder  No gallstones or wall thickening visualized. No sonographic Murphy sign noted.  Common bile duct  Diameter: 4.2 mm  Liver  No focal lesion identified. Within normal limits in parenchymal echogenicity.  IVC  No abnormality visualized.  Pancreas  Visualized portion unremarkable.  Spleen  Size and appearance within normal limits.  Right Kidney  Length: 9.8  cm in length. A 1.8 cm cyst is noted in the parapelvic region.  Left Kidney  Length: 10.7 cm in length. Echogenicity within normal limits. No mass or hydronephrosis visualized.  Abdominal aorta  No aneurysm visualized.  IMPRESSION: Right renal cyst. No other focal abnormality is noted.   Electronically Signed   By: Inez Catalina M.D.   On: 11/25/2012 08:12     ASSESSMENT AND PLAN:  Mr. Dekota Wasley is a 72 year old African American male with a long prior tobacco history but quit approximately 15 years ago. He has a history of hypertension, and also has experienced leg edema. He does have abdominal bloating there has been some question raised about right heart function and potential hepatic congestion. I'm scheduling him to undergo a 2-D echo Doppler study for further evaluation of systolic and diastolic function and right heart pressures. Also scheduling him for a lower extremity venous insufficiency study. I'm recommending he discontinue chlorthalidone in its place start torsemide 20 mg twice a day. I will recheck his comprehensive metabolic panel and lipid panel in 2 weeks. We will need to obtain information with reference to his [employer] company so that we can download his CPAP unit to assess adequacy of therapy. I'll see him back in the office in 4 weeks for further  recommendations and evaluation.   Troy Sine, MD, [facility] 12/22/2012 5:02 PM

## 2012-12-25 ENCOUNTER — Ambulatory Visit (INDEPENDENT_AMBULATORY_CARE_PROVIDER_SITE_OTHER): Payer: Medicare PPO | Admitting: Internal Medicine

## 2012-12-25 ENCOUNTER — Encounter: Payer: Self-pay | Admitting: Internal Medicine

## 2012-12-25 ENCOUNTER — Ambulatory Visit (INDEPENDENT_AMBULATORY_CARE_PROVIDER_SITE_OTHER)
Admission: RE | Admit: 2012-12-25 | Discharge: 2012-12-25 | Disposition: A | Discharge status: Home / Self Care | Payer: Medicare PPO | Source: Ambulatory Visit | Attending: Internal Medicine | Admitting: Internal Medicine

## 2012-12-25 VITALS — BP 140/84 | HR 91 | Temp 97.6°F | Ht 67.0 in | Wt 252.0 lb

## 2012-12-25 DIAGNOSIS — J449 Chronic obstructive pulmonary disease, unspecified: Secondary | ICD-10-CM

## 2012-12-25 LAB — PULMONARY FUNCTION TEST

## 2012-12-25 MED ORDER — FLUTICASONE FUROATE-VILANTEROL 100-25 MCG/INH IN AEPB: 1.0000 | INHALATION_SPRAY | Freq: Every morning | RESPIRATORY_TRACT | Status: DC

## 2012-12-25 NOTE — Progress Notes (Signed)
  Subjective:    Patient ID: Omar Lambert, male    DOB: 06/06/1940   MRN: [id number]   Brief patient profile:  72 yobm quit smoking 1999 at wt 200 lb with onset of breathing problems around 2005 but worse since so referred by [name] for evaluation of copd with GOLD II criteria 12/25/2012     History of Present Illness  11/24/2012 1st [facility] office visit/ Wert cc indolent onset progresssively worsening doe to point where only goes about  100 ft ? No better p saba no cough, breathing worse bending over. No better on advair hfa rec Breo one puff each am and stop all other respiratory medications Only use your albuterol   Bring all active medications to each visit separated into two bags : the ones you take automatically vs the ones you take as needed cxr on return     12/25/2012 f/u ov/Wert re: copd GOLD II plus obesity effects  Chief Complaint  Patient presents with  . Follow-up    PFT done today.  Breathing has improved since last OV.    No obvious day to day or daytime variabilty or assoc excess mucus production, cp or chest tightness, subjective wheeze overt sinus or hb symptoms. No unusual exp hx or h/o childhood pna/ asthma or knowledge of premature birth.  Sleeping ok without nocturnal  or early am exacerbation  of respiratory  c/o's or need for noct saba. Also denies any obvious fluctuation of symptoms with weather or environmental changes or other aggravating or alleviating factors except as outlined above   Current Medications, Allergies, Complete Past Medical History, Past Surgical History, Family History, and Social History were reviewed in [employer] record.  ROS  The following are not active complaints unless bolded sore throat, dysphagia, dental problems, itching, sneezing,  nasal congestion or excess/ purulent secretions, ear ache,   fever, chills, sweats, unintended wt loss, pleuritic or exertional cp, hemoptysis,  orthopnea  pnd or leg swelling, presyncope, palpitations, heartburn, abdominal pain, anorexia, nausea, vomiting, diarrhea  or change in bowel or urinary habits, change in stools or urine, dysuria,hematuria,  rash, arthralgias, visual complaints, headache, numbness weakness or ataxia or problems with walking or coordination,  change in mood/affect or memory.              .        Objective:   Physical Exam  12/25/2012     252  Wt Readings from Last 3 Encounters:  11/24/12 254 lb (115.214 kg)  11/18/12 256 lb (116.121 kg)  10/16/12 265 lb (120.203 kg)      amb obese bm nad hoarse with freq tc  Full dentures  HEENT mild turbinate edema.  Oropharynx no thrush or excess pnd or cobblestoning.  No JVD or cervical adenopathy. Mild accessory muscle hypertrophy. Trachea midline, nl thryroid. Chest was hyperinflated by percussion with diminished breath sounds and moderate increased exp time without wheeze. Hoover sign positive at mid inspiration. Regular rate and rhythm without murmur gallop or rub or increase P2 but 1-2 plus sym bilateral lower ext edema.  Abd: no hsm, nl excursion. Ext warm without cyanosis or clubbing.     CXR  12/25/2012 :     No active cardiopulmonary disease.  Assessment & Plan:

## 2012-12-25 NOTE — Progress Notes (Signed)
PFT done today. 

## 2012-12-25 NOTE — Patient Instructions (Addendum)
Continue breo daily    If you are satisfied with your treatment plan let your doctor [name])  know and  she can either refill your medications or you can return here when your prescription runs out.     If in any way you are not 100% satisfied,  please tell us.  If 100% better, tell your friends!   Late add consider referral to nutritionist with food diary if unable to get into neg cal balance

## 2012-12-25 NOTE — Assessment & Plan Note (Signed)
The "irreversible" component of his pft's is more related to the restrictive than the obstructive component so def needs to get into neg cal balance longterm and also avoid courses of prednisone for his minimal airways dz to get himself turned around.   See instructions for specific recommendations which were reviewed directly with the patient who was given a copy with highlighter outlining the key components.

## 2012-12-25 NOTE — Assessment & Plan Note (Signed)
-   PFTs 12/25/2012 FEV1  1.44 (58%) ratio 69 and no change p B2 p last Breo am of pft's   He barely meets criteria for copd but is definitely improved ex tol on breo so needs to stay on it if insurance allows and the throat clearing, which he says he also noted on advair, is tolerable, otherwise consider change to symbicort 160 2bid    Each maintenance medication was reviewed in detail including most importantly the difference between maintenance and as needed and under what circumstances the prns are to be used.  Please see instructions for details which were reviewed in writing and the patient given a copy.

## 2013-01-08 ENCOUNTER — Encounter: Payer: Self-pay | Admitting: Internal Medicine

## 2013-01-27 ENCOUNTER — Ambulatory Visit (INDEPENDENT_AMBULATORY_CARE_PROVIDER_SITE_OTHER): Payer: Medicare HMO | Admitting: *Deleted

## 2013-01-27 ENCOUNTER — Telehealth: Payer: Self-pay | Admitting: *Deleted

## 2013-01-27 DIAGNOSIS — I872 Venous insufficiency (chronic) (peripheral): Secondary | ICD-10-CM

## 2013-01-27 NOTE — Patient Instructions (Signed)
Compression Stockings Compression stockings are elastic stockings that "compress" your legs. This helps to increase blood flow, decrease swelling, and reduces the chance of getting blood clots in your lower legs. Compression stockings are used:  After surgery.  If you have a history of poor circulation.  If you are prone to blood clots.  If you have varicose veins.  If you sit or are bedridden for long periods of time. WEARING COMPRESSION STOCKINGS  Your compression stockings should be worn as instructed by your caregiver.  Wearing the correct stocking size is important. Your caregiver can help measure and fit you to the correct size.  When wearing your stockings, do not allow the stockings to bunch up. This is especially important around your toes or behind your knees. Keep the stockings as smooth as possible.  Do not roll the stockings downward and leave them rolled down. This can form a restrictive band around your legs and can decrease blood flow.  The stockings should be removed once a day for 1 hour or as instructed by your caregiver. When the stockings are taken off, inspect your legs and feet. Look for:  Open sores.  Red spots.  Puffy areas (swelling).  Anything that does not seem normal. IMPORTANT INFORMATION ABOUT COMPRESSION STOCKINGS  The compression stockings should be clean, dry, and in good condition before you put them on.  Do not put lotion on your legs or feet. This makes it harder to put the stockings on.  Change your stockings immediately if they become wet or soiled.  Do not wear stockings that are ripped or torn.  You may hand-wash or put your stockings in the washing machine. Use cold or warm water with mild detergent. Do not bleach your stockings. They may be air-dried or dried in the dryer on low heat.  If you have pain or have a feeling of "pins and needles" in your feet or legs, you may be wearing stockings that are too tight. Call your caregiver  right away. SEEK IMMEDIATE MEDICAL CARE IF:   You have numbness or tingling in your lower legs that does not get better quickly after the stockings are removed.  Your toes or feet become cold and blue.  You develop open sores or have red spots on your legs that do not go away. MAKE SURE YOU:   Understand these instructions.  Will watch your condition.  Will get help right away if you are not doing well or get worse. Document Released: 11/19/2008 Document Revised: 04/16/2011 Document Reviewed: 11/19/2008 [facility] Patient Information 2014 [place], [place].

## 2013-01-27 NOTE — Telephone Encounter (Signed)
Spoke with patient yesterday informing him of his lower extremity doppler results and Dr. Evette Georges recommendations to wear compression hose. Patient will come by to get fitted and purchase the hose.

## 2013-01-27 NOTE — Progress Notes (Signed)
Patient is here to have a knee hi compression stocking fitting\ See result notes from 01/22/2013   Compression stocking LEFT CALF 16.25 LEFT ANKLE 10  RIGHT CALF 16.5 LEFT ANKLE 10  PATIENT RECEIVED STOCKING  AND INSTRUCTION GIVEN BY Sheral Apley RN

## 2013-02-02 ENCOUNTER — Encounter: Payer: Self-pay | Admitting: Internal Medicine

## 2013-02-05 HISTORY — PX: EYE SURGERY: SHX253

## 2013-02-17 ENCOUNTER — Ambulatory Visit (INDEPENDENT_AMBULATORY_CARE_PROVIDER_SITE_OTHER): Payer: Medicare HMO | Admitting: Cardiovascular Disease

## 2013-02-17 ENCOUNTER — Encounter: Payer: Self-pay | Admitting: Cardiovascular Disease

## 2013-02-17 VITALS — BP 140/98 | HR 91 | Ht 69.0 in | Wt 262.0 lb

## 2013-02-17 DIAGNOSIS — J449 Chronic obstructive pulmonary disease, unspecified: Secondary | ICD-10-CM

## 2013-02-17 DIAGNOSIS — I872 Venous insufficiency (chronic) (peripheral): Secondary | ICD-10-CM

## 2013-02-17 DIAGNOSIS — Z9989 Dependence on other enabling machines and devices: Secondary | ICD-10-CM

## 2013-02-17 DIAGNOSIS — I10 Essential (primary) hypertension: Secondary | ICD-10-CM

## 2013-02-17 DIAGNOSIS — G4733 Obstructive sleep apnea (adult) (pediatric): Secondary | ICD-10-CM

## 2013-02-17 NOTE — Patient Instructions (Signed)
Your physician recommends that you schedule a follow-up appointment in: 6 MONTHS.  Your physician has recommended you make the following change in your medication: INCREASE torsemide to twice daily.

## 2013-02-17 NOTE — Progress Notes (Signed)
Patient ID: ARZELL HALL, male   DOB: 09/22/1940, 73 y.o.   MRN: [id number]     HPI:   Mr. Dartagnan Hughes is a 73 year old African American male with history of severe COPD, dyspnea GERD, as well as obstructive sleep apnea he presents for initial evaluation on 11/25/2012 for evaluation of shortness of breath.  Mr. Kosten has history of remote tobacco use and quit in 1999. He recently had returned to [place] and living in [place] [place]. He  saw Dr. [name] with complaints of gas, abdominal bloating, acid reflux, chronic constipation without melena or hematochezia. There was some concern about potential hepatic congestion from cardiac status. He has seen Dr. Melvyn Novas for evaluation of his severe COPD. Patient admits to shortness of breath with minimal activity. He has a history of obstructive sleep apnea but admits to only intermittently using CPAP therapy.  When I saw him initially, he did have large turbinate edema is concerned about significant lower extremity  venous insufficiency. He did undergo lower extremity venous Doppler study which did not show evidence for thrombus or thrombophlebitis. However, he did have bilateral deep venous insufficiency within the femoral and popliteal veins. No venous insufficiency was noted in the right and left greater saphenous vein or small saphenous vein. He underwent a 2-D echo Doppler study which was done on 12/17/2012. He had normal systolic function with an ejection fraction of 55-60% as well as normal diastolic function. He had mild biatrial enlargement and mild dilatation of his right ventricle. There is mild pulmonary hypertension with a PA pressure 39 mm.  He denies any episodes of chest tightness. He denies PND or orthopnea. He continues to note some mild leg swelling. He continues to be suboptimally compliant with his CPAP use.  Past Medical History  Diagnosis Date  . Benign essential hypertension   . Gout attack 09/2012  . Allergy   . Asthma     . Sinusitis, maxillary, chronic     Past Surgical History  Procedure Laterality Date  . Hernia repair  1980  . Colonoscopy  2003    No Known Allergies  Current Outpatient Prescriptions  Medication Sig Dispense Refill  . Aclidinium Bromide (TUDORZA PRESSAIR) 400 MCG/ACT AEPB Inhale 1 spray into the lungs daily.      Marland Kitchen albuterol (PROVENTIL) (2.5 MG/3ML) 0.083% nebulizer solution Take 3 mLs (2.5 mg total) by nebulization every 4 (four) hours as needed for wheezing.  30 vial  1  . aspirin 81 MG tablet Take 81 mg by mouth daily.      . colchicine 0.6 MG tablet Take 1 tablet (0.6 mg total) by mouth daily.  30 tablet  3  . fluticasone (FLONASE) 50 MCG/ACT nasal spray Place 2 sprays into the nose daily.  16 g  0  . Multiple Vitamins-Minerals (CENTRUM SILVER ADULT 50+ PO) Take 1 tablet by mouth daily.      Marland Kitchen omeprazole (PRILOSEC) 20 MG capsule Take 20 mg by mouth daily.      Marland Kitchen torsemide (DEMADEX) 20 MG tablet Take 1 tablet twice daily  60 tablet  6   No current facility-administered medications for this visit.    Social history is notable in that he is married for 55 years. He has 3 children 8 grandchildren 6 great-grandchildren. He is retired from [employer]. He completed ninth grade education. He smoked until 1999.  Family History  Problem Relation Age of Onset  . Stroke Mother   . Cancer Sister   . Diabetes  Sister   . COPD Sister   . COPD Sister     ROS is negative for fever chills or night sweats. He denies significant change in weight. He denies visual changes He denies skin changes. At times he admits to have having sinus pressure. He does have dentures he denies cough. He denies recent wheezing. He denies chest pressure. He is unaware of any arrhythmias. At times he notes abdominal bloating. He denies recent awareness of blood in the stool. He denies GU symptoms. He admits to  leg swelling. He does experience occasional headaches. He denies bleeding. From a sleep  prospective he typically goes to bed at 9 PM and wakes up at 4 AM. He is unaware of any restless legs. He denies heat or cold intolerance. There is no history of diabetes. He does have a history of gout but denies any recent attack since August 2014.  Other comprehensive 14 point system review is negative.  PE BP 140/98  Pulse 91  Ht 5\' 9"  (1.753 m)  Wt 262 lb (118.842 kg)  BMI 38.67 kg/m2 General: Alert, oriented, no distress.  Skin: normal turgor, no rashes HEENT: Normocephalic, atraumatic. Pupils round and reactive; sclera anicteric; Fundi mild arteriolar narrowing. No hemorrhages or exudates. Nose without nasal septal hypertrophy Mouth/Parynx benign; Mallinpatti scale 4 Neck: No JVD, no carotid bruits Lungs: clear to ausculatation and percussion; no wheezing or rales Heart: RRR, s1 s2 normal a 1/6 systolic murmur. No S3 or S4 gallop. No rubs thrills or heaves. Abdomen: Diastases recti; soft, nontender; no hepatosplenomehaly, BS+; abdominal aorta nontender and not dilated by palpation. Pulses 2+ Extremities: Right lower extremity tense edema in soft trace to 1+ edema in the left lower extremity; no clubbinbg cyanosis, Homan's sign negative  Neurologic: grossly nonfocal; radial nerves grossly intact  Psychologic: Normal mood   ECG (independently read by me): Sinus rhythm with frequent atrial premature conk contractions as well as occasional unifocal premature ventricular contractions. Nonspecific ST and T wave changes.  Prior ECG of 11/25/2012: Sinus rhythm with first-degree AV block with occasional PVC. The patient is entirely asymptomatic with reference to this ectopy.  LABS:  BMET    Component Value Date/Time   NA 140 11/18/2012 1459   NA 144 10/16/2012 1047   K 3.8 11/18/2012 1459   CL 98 11/18/2012 1459   CO2 35* 11/18/2012 1459   GLUCOSE 87 11/18/2012 1459   GLUCOSE 88 10/16/2012 1047   BUN 14 11/18/2012 1459   BUN 19 10/16/2012 1047   CREATININE 1.3 11/18/2012 1459    CALCIUM 9.5 11/18/2012 1459   GFRNONAA 54* 10/16/2012 1047   GFRAA 62 10/16/2012 1047     Hepatic Function Panel     Component Value Date/Time   PROT 7.2 11/18/2012 1459   PROT 6.8 10/16/2012 1047   ALBUMIN 4.0 11/18/2012 1459   AST 35 11/18/2012 1459   ALT 24 11/18/2012 1459   ALKPHOS 32* 11/18/2012 1459   BILITOT 0.8 11/18/2012 1459   BILIDIR 0.1 11/18/2012 1459     CBC    Component Value Date/Time   WBC 3.7* 11/18/2012 1459   WBC 5.1 10/16/2012 1047   RBC 4.51 11/18/2012 1459   RBC 4.25 10/16/2012 1047   HGB 13.3 11/18/2012 1459   HCT 40.8 11/18/2012 1459   PLT 176.0 11/18/2012 1459   MCV 90.5 11/18/2012 1459   MCH 28.9 10/16/2012 1047   MCH 29.6 06/17/2012 0628   MCHC 32.5 11/18/2012 1459   MCHC 31.9 10/16/2012 1047  RDW 14.9* 11/18/2012 1459   RDW 14.2 10/16/2012 1047   LYMPHSABS 1.2 11/18/2012 1459   LYMPHSABS 1.0 10/16/2012 1047   MONOABS 0.6 11/18/2012 1459   EOSABS 0.2 11/18/2012 1459   EOSABS 0.3 10/16/2012 1047   BASOSABS 0.0 11/18/2012 1459   BASOSABS 0.0 10/16/2012 1047     BNP No results found for this basename: probnp    Lipid Panel  No results found for this basename: chol,  trig,  hdl,  cholhdl,  vldl,  ldlcalc     RADIOLOGY: US Abdomen Complete  11/25/2012   CLINICAL DATA:  Abdominal distention  EXAM: ULTRASOUND ABDOMEN COMPLETE  COMPARISON:  None.  FINDINGS: Gallbladder  No gallstones or wall thickening visualized. No sonographic Murphy sign noted.  Common bile duct  Diameter: 4.2 mm  Liver  No focal lesion identified. Within normal limits in parenchymal echogenicity.  IVC  No abnormality visualized.  Pancreas  Visualized portion unremarkable.  Spleen  Size and appearance within normal limits.  Right Kidney  Length: 9.8 cm in length. A 1.8 cm cyst is noted in the parapelvic region.  Left Kidney  Length: 10.7 cm in length. Echogenicity within normal limits. No mass or hydronephrosis visualized.  Abdominal aorta  No aneurysm visualized.  IMPRESSION:  Right renal cyst. No other focal abnormality is noted.   Electronically Signed   By: Inez Catalina M.D.   On: 11/25/2012 08:12     ASSESSMENT AND PLAN:  Mr. Abukar Harp is a 73 year old African American male with a long history of prior tobacco use but quit approximately 15 years ago. He does have COPD. His echo Doppler study confirms normal systolic and diastolic function. He does have mild biatrial enlargement as well as mild dilatation of his right ventricle. There is mild pulmonary hypertension with an estimated PA pressure 39 mm. He remains asymptomatic with reference to his cardiac rhythm but he does have occasional ectopy and both PACs and PVCs. He also has venous insufficiency of the femoral and popliteal. He does have mild edema today. I'm recommending further titration of his torsemide to 20 mg twice a day. He may benefit from support stockings and if not successful consideration for possible radio frequency ablation of his lower extremity venous insufficiency vessels. His blood pressure initially was elevated at 140/98 on repeat by me was 120/86. The increase torsemide should also further help his blood pressure. I also discussed with him the absolute importance of 100% percent compliacet with reference to CPAP use and again discuss the adverse consequences with reference to his cardiovascular comorbidities of sleep apnea if left untreated.. If his blood pressure continues to be elevated and he continues to have ectopy it may be worthwhile to consider adding a very low dose cardioselective beta blocker for ectopy suppression.    Troy Sine, MD, [facility] 02/17/2013 3:57 PM

## 2013-02-18 ENCOUNTER — Other Ambulatory Visit: Payer: Self-pay | Admitting: Internal Medicine

## 2013-03-09 ENCOUNTER — Encounter: Payer: Self-pay | Admitting: Internal Medicine

## 2013-03-09 ENCOUNTER — Ambulatory Visit (INDEPENDENT_AMBULATORY_CARE_PROVIDER_SITE_OTHER): Payer: Commercial Managed Care - HMO | Admitting: Internal Medicine

## 2013-03-09 VITALS — BP 124/70 | HR 66 | Temp 98.3°F | Wt 263.4 lb

## 2013-03-09 DIAGNOSIS — J4489 Other specified chronic obstructive pulmonary disease: Secondary | ICD-10-CM

## 2013-03-09 DIAGNOSIS — M10471 Other secondary gout, right ankle and foot: Secondary | ICD-10-CM

## 2013-03-09 DIAGNOSIS — G4733 Obstructive sleep apnea (adult) (pediatric): Secondary | ICD-10-CM

## 2013-03-09 DIAGNOSIS — M109 Gout, unspecified: Secondary | ICD-10-CM

## 2013-03-09 DIAGNOSIS — I10 Essential (primary) hypertension: Secondary | ICD-10-CM

## 2013-03-09 DIAGNOSIS — K219 Gastro-esophageal reflux disease without esophagitis: Secondary | ICD-10-CM

## 2013-03-09 DIAGNOSIS — J449 Chronic obstructive pulmonary disease, unspecified: Secondary | ICD-10-CM

## 2013-03-09 DIAGNOSIS — E119 Type 2 diabetes mellitus without complications: Secondary | ICD-10-CM

## 2013-03-09 MED ORDER — FLUTICASONE FUROATE-VILANTEROL 100-25 MCG/INH IN AEPB: 1.0000 | INHALATION_SPRAY | Freq: Every morning | RESPIRATORY_TRACT | Status: DC

## 2013-03-09 MED ORDER — METFORMIN HCL 500 MG PO TABS: 250.0000 mg | ORAL_TABLET | Freq: Every day | ORAL | Status: DC

## 2013-03-09 MED ORDER — ALLOPURINOL 100 MG PO TABS: 100.0000 mg | ORAL_TABLET | Freq: Every day | ORAL | Status: DC

## 2013-03-09 NOTE — Progress Notes (Signed)
Patient ID: Omar Lambert, male   DOB: [birth year], 73 y.o.   MRN: [id number]   Location:  [facility] / [place]  No Known Allergies  Chief Complaint  Patient presents with  . Acute Visit    having gout flare-up in both feet (RT worse) x 2 weeks.  Marland Kitchen other    declines flu vaccine & depression screening done.    HPI: Patient is a 73 y.o. male seen in the office today to go over all of his specialty visits and now for acute visit for gout in bilateral feet.  Unfortunately, I have never seen the patient after his initial visit to establish.  At that point I sent him to a GI specialist who then sent him to cardiology and pulmonary before I could obtain any more information about the patient.    Seeing Dr. Melvyn Novas from pulmonary.  Had been using Breo, but did run out.  Needs it today.  (see pfts below)  Saw Dr. Claiborne Billings.  Echo fairly good.  (see echo below)  Has his pills with him, but still does not know what they are for.  I wrote on them today.    Does feel a lot better than he did when he first came.    Gout was doing ok until last week.  Started to ache some and swell up.  Have gotten a bit better, but still swollen on right one.  Does not recall eating or drinking anything that would trigger it.  Seems like he gets it this time of year each year.    Weight has fluctuated.  Last time here, hba1c was 6.6, in diabetic range.  Review of Systems:  Review of Systems  Constitutional: Positive for malaise/fatigue. Negative for fever.  HENT: Negative for congestion.   Eyes: Negative for blurred vision.  Respiratory: Positive for shortness of breath.   Cardiovascular: Positive for leg swelling. Negative for chest pain.  Gastrointestinal: Negative for abdominal pain, constipation, blood in stool and melena.  Genitourinary: Negative for dysuria.  Musculoskeletal: Positive for joint pain and myalgias. Negative for falls.  Neurological: Positive for weakness. Negative  for dizziness and headaches.  Psychiatric/Behavioral: The patient does not have insomnia.      Past Medical History  Diagnosis Date  . Benign essential hypertension   . Gout attack 09/2012  . Allergy   . Asthma   . Sinusitis, maxillary, chronic     Past Surgical History  Procedure Laterality Date  . Hernia repair  1980  . Colonoscopy  2003    Social History:   reports that he quit smoking about 15 years ago. His smoking use included Cigarettes. He has a 20 pack-year smoking history. He has never used smokeless tobacco. He reports that he does not drink alcohol or use illicit drugs.  Family History  Problem Relation Age of Onset  . Stroke Mother   . Cancer Sister   . Diabetes Sister   . COPD Sister   . COPD Sister     Medications: Patient's Medications  New Prescriptions   No medications on file  Previous Medications   ACLIDINIUM BROMIDE (TUDORZA PRESSAIR) 400 MCG/ACT AEPB    Inhale 1 spray into the lungs daily.   ALBUTEROL (PROVENTIL) (2.5 MG/3ML) 0.083% NEBULIZER SOLUTION    Take 3 mLs (2.5 mg total) by nebulization every 4 (four) hours as needed for wheezing.   ASPIRIN 81 MG TABLET    Take 81 mg by mouth daily.  COLCRYS 0.6 MG TABLET    TAKE 1 TABLET BY MOUTH EVERY DAY   MONTELUKAST (SINGULAIR) 10 MG TABLET    Take 10 mg by mouth at bedtime.   MULTIPLE VITAMINS-MINERALS (CENTRUM SILVER ADULT 50+ PO)    Take 1 tablet by mouth daily.   OMEPRAZOLE (PRILOSEC) 20 MG CAPSULE    Take 20 mg by mouth daily.   TORSEMIDE (DEMADEX) 20 MG TABLET    Take 1 tablet twice daily  Modified Medications   No medications on file  Discontinued Medications   FLUTICASONE (FLONASE) 50 MCG/ACT NASAL SPRAY    Place 2 sprays into the nose daily.     Physical Exam: Filed Vitals:   03/09/13 1341  BP: 124/70  Pulse: 66  Temp: 98.3 F (36.8 C)  TempSrc: Oral  Weight: 263 lb 6.4 oz (119.477 kg)  SpO2: 94%   Physical Exam  Constitutional: He is oriented to person, place, and time. He  appears well-developed and well-nourished. No distress.  HENT:  Head: Normocephalic and atraumatic.  Neck: Neck supple.  Cardiovascular: Normal rate, regular rhythm, normal heart sounds and intact distal pulses.   Pulmonary/Chest: Effort normal and breath sounds normal.  Abdominal: Soft. Bowel sounds are normal. He exhibits no distension and no mass. There is no tenderness. No hernia.  Musculoskeletal:  Right foot erythema, warmth, swelling  Neurological: He is alert and oriented to person, place, and time.  Psychiatric: He has a normal mood and affect.     Labs reviewed: Basic Metabolic Panel:  Recent Labs  06/17/12 0628 10/16/12 1047 11/18/12 1459  NA 139 144 140  K 4.2 4.2 3.8  CL 98 99 98  CO2 36* 33* 35*  GLUCOSE 94 88 87  BUN 23 19 14   CREATININE 1.39* 1.31* 1.3  CALCIUM 9.3 9.3 9.5  TSH  --  3.270 2.05   Liver Function Tests:  Recent Labs  06/17/12 0628 10/16/12 1047 11/18/12 1459  AST 34 25 35  ALT 22 18 24   ALKPHOS 39 48 32*  BILITOT 0.6 0.4 0.8  PROT 7.4 6.8 7.2  ALBUMIN 4.1  --  4.0   No results found for this basename: LIPASE, AMYLASE,  in the last 8760 hours No results found for this basename: AMMONIA,  in the last 8760 hours CBC:  Recent Labs  06/17/12 0628 10/16/12 1047 11/18/12 1459  WBC 5.0 5.1 3.7*  NEUTROABS 3.5 3.4 1.7  HGB 13.3 12.3* 13.3  HCT 42.7 38.6 40.8  MCV 95.1 91 90.5  PLT 185  --  176.0   Lab Results  Component Value Date   HGBA1C 6.6* 10/16/2012   Assessment/Plan 1. Acute gout of right foot -start colchicine tabs daily for a 6 mo period - restart allopurinol and continue daily indefinitely and f/u uric acid level - CBC with Differential - Comprehensive metabolic panel - Uric Acid  2. Diabetes mellitus type II, controlled -cont diet and encouraged exercise for him -cont asa, metformin, needs lipid panel and statin therapy as well as ace/arb in future - CBC with Differential - Comprehensive metabolic panel -  Hemoglobin [id number] - Basic metabolic panel; Future  3. COPD, moderate -cot albuterol, breo and singlair  4. Obstructive sleep apnea -to use cpap  5. Essential hypertension, benign -bp at goal with diuretic only but will need ace/arb due to diabetes--will need urine microalbumin test  6. GERD (gastroesophageal reflux disease) -cont omeprazole 20 mins before meal and preventive approaches like elevating hob and avoid acidic foods, not eating  at least 2 hrs before bed  Labs/tests ordered:   Orders Placed This Encounter  Procedures  . CBC with Differential  . Comprehensive metabolic panel  . Hemoglobin A1c  . Uric Acid  . Basic metabolic panel    Standing Status: Future     Number of Occurrences:      Standing Expiration Date: 03/09/2014  . HM COLONOSCOPY    This external order was created through the Results Console.    Next appt:  2 mos

## 2013-03-09 NOTE — Patient Instructions (Signed)
Start metformin 250mg  daily with supper (largest meal).  Do not stop if it upsets your stomach at first.  Try to take it for at least a couple of weeks.  Start allopurinol to prevent gout flares.  Next time, we will stop your colchicine.  Continue your Breo from Dr. Melvyn Novas.    Ask your wife to help you wear your compression hose daily.  Cont the torsemide per Dr. Claiborne Billings.    We will call you with your lab results.

## 2013-03-10 LAB — CBC WITH DIFFERENTIAL/PLATELET
Basophils Absolute: 0 10*3/uL (ref 0.0–0.2)
Basos: 0 %
Eos: 2 %
Eosinophils Absolute: 0.1 10*3/uL (ref 0.0–0.4)
HCT: 38.7 % (ref 37.5–51.0)
Hemoglobin: 12.7 g/dL (ref 12.6–17.7)
Immature Grans (Abs): 0 10*3/uL (ref 0.0–0.1)
Immature Granulocytes: 0 %
Lymphocytes Absolute: 1.4 10*3/uL (ref 0.7–3.1)
Lymphs: 27 %
MCH: 30.3 pg (ref 26.6–33.0)
MCHC: 32.8 g/dL (ref 31.5–35.7)
MCV: 92 fL (ref 79–97)
Monocytes Absolute: 0.6 10*3/uL (ref 0.1–0.9)
Monocytes: 11 %
Neutrophils Absolute: 3.2 10*3/uL (ref 1.4–7.0)
Neutrophils Relative %: 60 %
RBC: 4.19 x10E6/uL (ref 4.14–5.80)
RDW: 14.3 % (ref 12.3–15.4)
WBC: 5.4 10*3/uL (ref 3.4–10.8)

## 2013-03-10 LAB — COMPREHENSIVE METABOLIC PANEL WITH GFR
ALT: 25 [IU]/L (ref 0–44)
AST: 39 [IU]/L (ref 0–40)
Albumin/Globulin Ratio: 1.6 (ref 1.1–2.5)
Albumin: 4.1 g/dL (ref 3.5–4.8)
Alkaline Phosphatase: 42 [IU]/L (ref 39–117)
BUN/Creatinine Ratio: 12 (ref 10–22)
BUN: 13 mg/dL (ref 8–27)
CO2: 28 mmol/L (ref 18–29)
Calcium: 9.2 mg/dL (ref 8.6–10.2)
Chloride: 100 mmol/L (ref 97–108)
Creatinine, Ser: 1.13 mg/dL (ref 0.76–1.27)
GFR calc Af Amer: 74 mL/min/{1.73_m2}
GFR calc non Af Amer: 64 mL/min/{1.73_m2}
Globulin, Total: 2.5 g/dL (ref 1.5–4.5)
Glucose: 107 mg/dL — ABNORMAL HIGH (ref 65–99)
Potassium: 4 mmol/L (ref 3.5–5.2)
Sodium: 142 mmol/L (ref 134–144)
Total Bilirubin: 0.4 mg/dL (ref 0.0–1.2)
Total Protein: 6.6 g/dL (ref 6.0–8.5)

## 2013-03-10 LAB — URIC ACID: Uric Acid: 10.4 mg/dL — ABNORMAL HIGH (ref 3.7–8.6)

## 2013-03-10 LAB — HEMOGLOBIN A1C
Est. average glucose Bld gHb Est-mCnc: 134 mg/dL
Hgb A1c MFr Bld: 6.3 % — ABNORMAL HIGH (ref 4.8–5.6)

## 2013-03-14 ENCOUNTER — Encounter: Payer: Self-pay | Admitting: Cardiovascular Disease

## 2013-03-14 DIAGNOSIS — I872 Venous insufficiency (chronic) (peripheral): Secondary | ICD-10-CM | POA: Insufficient documentation

## 2013-03-24 ENCOUNTER — Other Ambulatory Visit: Payer: Medicare PPO

## 2013-04-01 ENCOUNTER — Other Ambulatory Visit: Payer: Self-pay | Admitting: Nurse Practitioner

## 2013-04-01 ENCOUNTER — Other Ambulatory Visit: Payer: Self-pay | Admitting: Internal Medicine

## 2013-05-11 ENCOUNTER — Ambulatory Visit: Payer: Medicare PPO | Admitting: Internal Medicine

## 2013-05-11 DIAGNOSIS — Z0289 Encounter for other administrative examinations: Secondary | ICD-10-CM

## 2013-06-08 ENCOUNTER — Ambulatory Visit (INDEPENDENT_AMBULATORY_CARE_PROVIDER_SITE_OTHER): Payer: Medicare PPO | Admitting: Internal Medicine

## 2013-06-08 ENCOUNTER — Encounter: Payer: Self-pay | Admitting: Internal Medicine

## 2013-06-08 VITALS — BP 126/74 | HR 81 | Temp 98.3°F | Resp 10 | Wt 259.0 lb

## 2013-06-08 DIAGNOSIS — I872 Venous insufficiency (chronic) (peripheral): Secondary | ICD-10-CM

## 2013-06-08 DIAGNOSIS — J320 Chronic maxillary sinusitis: Secondary | ICD-10-CM

## 2013-06-08 DIAGNOSIS — J4489 Other specified chronic obstructive pulmonary disease: Secondary | ICD-10-CM

## 2013-06-08 DIAGNOSIS — I10 Essential (primary) hypertension: Secondary | ICD-10-CM

## 2013-06-08 DIAGNOSIS — K219 Gastro-esophageal reflux disease without esophagitis: Secondary | ICD-10-CM

## 2013-06-08 DIAGNOSIS — E119 Type 2 diabetes mellitus without complications: Secondary | ICD-10-CM

## 2013-06-08 DIAGNOSIS — G4733 Obstructive sleep apnea (adult) (pediatric): Secondary | ICD-10-CM

## 2013-06-08 DIAGNOSIS — J449 Chronic obstructive pulmonary disease, unspecified: Secondary | ICD-10-CM

## 2013-06-08 DIAGNOSIS — M109 Gout, unspecified: Secondary | ICD-10-CM | POA: Insufficient documentation

## 2013-06-08 DIAGNOSIS — Z9989 Dependence on other enabling machines and devices: Secondary | ICD-10-CM

## 2013-06-08 MED ORDER — TORSEMIDE 20 MG PO TABS: ORAL_TABLET | ORAL | Status: DC

## 2013-06-08 MED ORDER — FLUTICASONE FUROATE-VILANTEROL 100-25 MCG/INH IN AEPB: 1.0000 | INHALATION_SPRAY | Freq: Every morning | RESPIRATORY_TRACT | Status: DC

## 2013-06-08 MED ORDER — METFORMIN HCL 500 MG PO TABS: 250.0000 mg | ORAL_TABLET | Freq: Every day | ORAL | Status: DC

## 2013-06-08 MED ORDER — COLCHICINE 0.6 MG PO TABS: 0.6000 mg | ORAL_TABLET | Freq: Every day | ORAL | Status: DC

## 2013-06-08 MED ORDER — ALBUTEROL SULFATE (2.5 MG/3ML) 0.083% IN NEBU: 2.5000 mg | INHALATION_SOLUTION | RESPIRATORY_TRACT | Status: DC | PRN

## 2013-06-08 MED ORDER — OMEPRAZOLE 20 MG PO CPDR: 20.0000 mg | DELAYED_RELEASE_CAPSULE | Freq: Every day | ORAL | Status: DC

## 2013-06-08 MED ORDER — MONTELUKAST SODIUM 10 MG PO TABS: 10.0000 mg | ORAL_TABLET | Freq: Every day | ORAL | Status: DC

## 2013-06-08 MED ORDER — ALLOPURINOL 100 MG PO TABS: 100.0000 mg | ORAL_TABLET | Freq: Every day | ORAL | Status: DC

## 2013-06-08 NOTE — Progress Notes (Signed)
Patient ID: Omar Lambert, male   DOB: 1940-08-12, 73 y.o.   MRN: [id number]   Location:  [facility] [place]  No Known Allergies  Chief Complaint  Patient presents with  . Fatigue    Sluggish feeling   . Allergies    Patient with allergies, SOB and congestion   . Edema    Patient with swelling in hands, legs and feet off/on x several years   . Medication Management    Patient stopped several medications, patient would like to discuss the need to restart medications     HPI: Patient is a 73 y.o. black male seen in the office today for medical mgt of chronic diseases.  Unfortunately, he stopped several of his crucial medications including his inhaler, singulair, demadex and glucophage.    Symptoms about the same.  Has a lot of phlegm he's been coughing up.  His throat is sore.  Just started over the weekend.  No sick contacts.  Is off of singulair and breo.    Says he doesn't know what is for what and he's not taking a lot of the medicines.  Has not been taking torsemide and feet are swollen.    Right hand is hurting him.  Middle finger gets very sore and swollen, even up into hand.  Also happens to big toe on right foot.  Says he is taking allopurinol every day.  Is out of colcrys.  Was started in feb and to take for 6 mos.    Went three weeks not taking anything.  Is on a fixed income so cannot afford a lot of the medicine.  Was not having side effects.  Is able to read, but not everything, he says  He is wearing his CPAP, but not faithfully every night.  Importance of this reinforced to him.  Also extensively reviewed what meds are for and added all indications to his chart.  Review of Systems:  Review of Systems  Constitutional: Negative for fever and chills.  HENT: Positive for congestion and sore throat.   Eyes: Positive for blurred vision.  Respiratory: Positive for sputum production. Negative for cough, shortness of breath and wheezing.     Cardiovascular: Positive for leg swelling. Negative for chest pain.  Gastrointestinal: Positive for heartburn. Negative for nausea, vomiting and constipation.  Genitourinary: Negative for dysuria, urgency and frequency.  Musculoskeletal: Positive for joint pain. Negative for falls and myalgias.       Right hand and right great toe  Skin: Negative for rash.  Neurological: Negative for dizziness, loss of consciousness and weakness.  Endo/Heme/Allergies: Bruises/bleeds easily.    Past Medical History  Diagnosis Date  . Benign essential hypertension   . Gout attack 09/2012  . Allergy   . Asthma   . Sinusitis, maxillary, chronic     Past Surgical History  Procedure Laterality Date  . Hernia repair  1980  . Colonoscopy  2003    Social History:   reports that he quit smoking about 15 years ago. His smoking use included Cigarettes. He has a 20 pack-year smoking history. He has never used smokeless tobacco. He reports that he does not drink alcohol or use illicit drugs.  Family History  Problem Relation Age of Onset  . Stroke Mother   . Cancer Sister   . Diabetes Sister   . COPD Sister   . COPD Sister     Medications: Patient's Medications  New Prescriptions   No medications  on file  Previous Medications   ALBUTEROL (PROVENTIL) (2.5 MG/3ML) 0.083% NEBULIZER SOLUTION    Take 3 mLs (2.5 mg total) by nebulization every 4 (four) hours as needed for wheezing.   ALLOPURINOL (ZYLOPRIM) 100 MG TABLET    Take 1 tablet (100 mg total) by mouth daily.   ASPIRIN 81 MG TABLET    Take 81 mg by mouth daily.   COLCRYS 0.6 MG TABLET    TAKE 1 TABLET BY MOUTH EVERY DAY   FLUTICASONE FUROATE-VILANTEROL (BREO ELLIPTA) 100-25 MCG/INH AEPB    Inhale 1 puff into the lungs every morning.   METFORMIN (GLUCOPHAGE) 500 MG TABLET    Take 0.5 tablets (250 mg total) by mouth daily with supper.   MONTELUKAST (SINGULAIR) 10 MG TABLET    Take 10 mg by mouth at bedtime.   MULTIPLE VITAMINS-MINERALS (CENTRUM  SILVER ADULT 50+ PO)    Take 1 tablet by mouth daily.   OMEPRAZOLE (PRILOSEC) 20 MG CAPSULE    TAKE ONE CAPSULE BY MOUTH EVERY DAY   TORSEMIDE (DEMADEX) 20 MG TABLET    Take 1 tablet twice daily  Modified Medications   No medications on file  Discontinued Medications   No medications on file     Physical Exam: Filed Vitals:   06/08/13 1354  BP: 126/74  Pulse: 81  Temp: 98.3 F (36.8 C)  TempSrc: Oral  Resp: 10  Weight: 259 lb (117.482 kg)  SpO2: 92%  Physical Exam  Constitutional: He is oriented to person, place, and time.  Obese black male  Cardiovascular: Normal rate, regular rhythm, normal heart sounds and intact distal pulses.   Pulmonary/Chest: Effort normal. He has wheezes.  Abdominal: Soft. Bowel sounds are normal. He exhibits no distension and no mass. There is no tenderness.  Neurological: He is alert and oriented to person, place, and time.  Skin: Skin is warm and dry.     Labs reviewed: Basic Metabolic Panel:  Recent Labs  06/17/12 0628 10/16/12 1047 11/18/12 1459 03/09/13 1506  NA 139 144 140 142  K 4.2 4.2 3.8 4.0  CL 98 99 98 100  CO2 36* 33* 35* 28  GLUCOSE 94 88 87 107*  BUN 23 19 14 13   CREATININE 1.39* 1.31* 1.3 1.13  CALCIUM 9.3 9.3 9.5 9.2  TSH  --  3.270 2.05  --    Liver Function Tests:  Recent Labs  06/17/12 0628 10/16/12 1047 11/18/12 1459 03/09/13 1506  AST 34 25 35 39  ALT 22 18 24 25   ALKPHOS 39 48 32* 42  BILITOT 0.6 0.4 0.8 0.4  PROT 7.4 6.8 7.2 6.6  ALBUMIN 4.1  --  4.0  --   CBC:  Recent Labs  06/17/12 0628 10/16/12 1047 11/18/12 1459 03/09/13 1506  WBC 5.0 5.1 3.7* 5.4  NEUTROABS 3.5 3.4 1.7 3.2  HGB 13.3 12.3* 13.3 12.7  HCT 42.7 38.6 40.8 38.7  MCV 95.1 91 90.5 92  PLT 185  --  176.0  --    Lab Results  Component Value Date   HGBA1C 6.3* 03/09/2013   Assessment/Plan 1. Gout -reinforced medication adherence and given dietary recommendations - allopurinol (ZYLOPRIM) 100 MG tablet; Take 1 tablet (100  mg total) by mouth daily. For gout  Dispense: 30 tablet; Refill: 6 - CBC With differential/Platelet  2. COPD, moderate -reinforced medication adherence to prevent his current symptoms and prevent exacerbations - Fluticasone Furoate-Vilanterol (BREO ELLIPTA) 100-25 MCG/INH AEPB; Inhale 1 puff into the lungs every morning. For asthma  Dispense: 1 each; Refill: 3  3. Diabetes mellitus type II, controlled - encouraged medication adherence  - metFORMIN (GLUCOPHAGE) 500 MG tablet; Take 0.5 tablets (250 mg total) by mouth daily with supper.  Dispense: 15 tablet; Refill: 3 - Hemoglobin A1c - Microalbumin/Creatinine Ratio, Urine  4. Severe obesity (BMI >= 40) -encouraged diet and exercise and medication adherence to help with this  5. OSA on CPAP -encouraged adherence with CPAP EVERY night - Comprehensive metabolic panel  6. Sinusitis, maxillary, chronic - emphasized that using his cpap and taking his meds will probably help with this, too along with healthy diet and exercise -recommended nettipot use for congestion and warm humidity--no need for abx at this time - Comprehensive metabolic panel  7. Venous insufficiency of both lower extremities -discussed that he needs to elevate his feet at rest -may benefit from using compression hose if he can get them on -also has chf but has not been taking his torsemide - Comprehensive metabolic panel  8. Benign essential hypertension -bp at goal with current meds - Comprehensive metabolic panel  9. GERD (gastroesophageal reflux disease) - says he has been using the omeprazole and still has symptoms--will make sure he does not have h pylori - H. pylori Antibody, IgG  Labs/tests ordered:   Orders Placed This Encounter  Procedures  . Hemoglobin A1c  . CBC With differential/Platelet  . Comprehensive metabolic panel  . Microalbumin/Creatinine Ratio, Urine  . H. pylori Antibody, IgG    Next appt:  3 mos to f/u on diabetes, copd, htn and  MEDICATION ADHERENCE

## 2013-06-08 NOTE — Patient Instructions (Signed)
Gout  Gout is when your joints become red, sore, and swell (inflammed). This is caused by the buildup of uric acid crystals in the joints. Uric acid is a chemical that is normally in the blood. If the level of uric acid gets too high in the blood, these crystals form in your joints and tissues. Over time, these crystals can form into masses near the joints and tissues. These masses can destroy bone and cause the bone to look misshapen (deformed).  HOME CARE   · Do not take aspirin for pain.  · Only take medicine as told by your doctor.  · Rest the joint as much as you can. When in bed, keep sheets and blankets off painful areas.  · Keep the sore joints raised (elevated).  · Put warm or cold packs on painful joints. Use of warm or cold packs depends on which works best for you.  · Use crutches if the painful joint is in your leg.  · Drink enough fluids to keep your pee (urine) clear or pale yellow. Limit alcohol, sugary drinks, and drinks with fructose in them.  · Follow your diet instructions. Pay careful attention to how much protein you eat. Include fruits, vegetables, whole grains, and fat-free or low-fat milk products in your daily diet. Talk to your doctor or dietician about the use of coffee, vitamin C, and cherries. These may help lower uric acid levels.  · Keep a healthy body weight.  GET HELP RIGHT AWAY IF:   · You have watery poop (diarrhea), throw up (vomit), or have any side effects from medicines.  · You do not feel better in 24 hours, or you are getting worse.  · Your joint becomes suddenly more tender, and you have chills or a fever.  MAKE SURE YOU:   · Understand these instructions.  · Will watch your condition.  · Will get help right away if you are not doing well or get worse.  Document Released: 11/01/2007 Document Revised: 05/19/2012 Document Reviewed: 05/02/2009  ExitCare® Patient Information ©2014 [employer].

## 2013-06-09 ENCOUNTER — Other Ambulatory Visit: Payer: Self-pay | Admitting: *Deleted

## 2013-06-09 DIAGNOSIS — J320 Chronic maxillary sinusitis: Secondary | ICD-10-CM

## 2013-06-09 DIAGNOSIS — G4733 Obstructive sleep apnea (adult) (pediatric): Secondary | ICD-10-CM

## 2013-06-09 DIAGNOSIS — M109 Gout, unspecified: Secondary | ICD-10-CM

## 2013-06-09 DIAGNOSIS — E119 Type 2 diabetes mellitus without complications: Secondary | ICD-10-CM

## 2013-06-09 DIAGNOSIS — I872 Venous insufficiency (chronic) (peripheral): Secondary | ICD-10-CM

## 2013-06-09 DIAGNOSIS — I10 Essential (primary) hypertension: Secondary | ICD-10-CM

## 2013-06-10 LAB — CBC WITH DIFFERENTIAL/PLATELET
Basophils Absolute: 0 10*3/uL (ref 0.0–0.2)
Basos: 0 %
Eos: 1 %
Eosinophils Absolute: 0.1 10*3/uL (ref 0.0–0.4)
HCT: 40.7 % (ref 37.5–51.0)
Hemoglobin: 12.8 g/dL (ref 12.6–17.7)
Immature Grans (Abs): 0 10*3/uL (ref 0.0–0.1)
Immature Granulocytes: 0 %
Lymphocytes Absolute: 1.2 10*3/uL (ref 0.7–3.1)
Lymphs: 23 %
MCH: 29 pg (ref 26.6–33.0)
MCHC: 31.4 g/dL — ABNORMAL LOW (ref 31.5–35.7)
MCV: 92 fL (ref 79–97)
Monocytes Absolute: 0.7 10*3/uL (ref 0.1–0.9)
Monocytes: 13 %
Neutrophils Absolute: 3.4 10*3/uL (ref 1.4–7.0)
Neutrophils Relative %: 63 %
RBC: 4.41 x10E6/uL (ref 4.14–5.80)
RDW: 14.5 % (ref 12.3–15.4)
WBC: 5.4 10*3/uL (ref 3.4–10.8)

## 2013-06-10 LAB — COMPREHENSIVE METABOLIC PANEL
ALT: 32 IU/L (ref 0–44)
AST: 38 IU/L (ref 0–40)
Albumin/Globulin Ratio: 1.6 (ref 1.1–2.5)
Albumin: 4.4 g/dL (ref 3.5–4.8)
Alkaline Phosphatase: 55 IU/L (ref 39–117)
BUN/Creatinine Ratio: 10 (ref 10–22)
BUN: 13 mg/dL (ref 8–27)
CO2: 25 mmol/L (ref 18–29)
Calcium: 9.3 mg/dL (ref 8.6–10.2)
Chloride: 95 mmol/L — ABNORMAL LOW (ref 97–108)
Creatinine, Ser: 1.3 mg/dL — ABNORMAL HIGH (ref 0.76–1.27)
GFR calc Af Amer: 62 mL/min/{1.73_m2} (ref >59)
GFR calc non Af Amer: 54 mL/min/{1.73_m2} — ABNORMAL LOW (ref >59)
Globulin, Total: 2.8 g/dL (ref 1.5–4.5)
Glucose: 76 mg/dL (ref 65–99)
Potassium: 4.2 mmol/L (ref 3.5–5.2)
Sodium: 141 mmol/L (ref 134–144)
Total Bilirubin: 0.4 mg/dL (ref 0.0–1.2)
Total Protein: 7.2 g/dL (ref 6.0–8.5)

## 2013-06-10 LAB — H. PYLORI ANTIBODY, IGG: H Pylori IgG: <0.9 U/mL (ref 0.0–0.8)

## 2013-06-10 LAB — HEMOGLOBIN A1C
Est. average glucose Bld gHb Est-mCnc: 134 mg/dL
Hgb A1c MFr Bld: 6.3 % — ABNORMAL HIGH (ref 4.8–5.6)

## 2013-06-10 LAB — MICROALBUMIN / CREATININE URINE RATIO
Creatinine, Ur: 49.3 mg/dL (ref 22.0–328.0)
MICROALB/CREAT RATIO: 58 mg/g creat — ABNORMAL HIGH (ref 0.0–30.0)
Microalbumin, Urine: 28.6 ug/mL — ABNORMAL HIGH (ref 0.0–17.0)

## 2013-06-17 ENCOUNTER — Encounter: Payer: Self-pay | Admitting: *Deleted

## 2013-06-17 ENCOUNTER — Other Ambulatory Visit: Payer: Self-pay | Admitting: *Deleted

## 2013-06-17 MED ORDER — LISINOPRIL 5 MG PO TABS: 5.0000 mg | ORAL_TABLET | Freq: Every day | ORAL | Status: DC

## 2013-06-17 NOTE — Telephone Encounter (Signed)
Pt notified VIA phone regarding lab results and medication being sent to the pharmacy

## 2013-08-12 ENCOUNTER — Telehealth: Payer: Self-pay | Admitting: Cardiovascular Disease

## 2013-08-12 NOTE — Telephone Encounter (Signed)
Needs to know how to get more support stockings.  Please call

## 2013-08-12 NOTE — Telephone Encounter (Signed)
Spoke with pt wife, aware support stockings can be purchased at [employer] or any medical supply store. She will call back if she needs a prescription.

## 2013-09-07 NOTE — Progress Notes (Signed)
Patient ID: Omar Lambert, male   DOB: November 18, 1940, 73 y.o.   MRN: [id number] Procedures reviewed:   10/14 Dr. Sharlett Iles 10/17 US done was ok, right renal cyst, no focal abnormality 10/20 Dr. Melvyn Novas stopped breo, no other changes 10/21 CT abdomen--no done 10/21 CV Dr. Gigi Gin 2d echo, doppler, LE venous insufficiency study, d/c'd chlorthalidone and started toresemide 20mg  po bid, cmp, flp ordered for 2 wks and CPAP unit evaluation 10/29 venous doppler negative for dvt, positive for bilateral venous insuffiency--advised to wear compression hose 11/12 2d echo:  EF 0000000, nl LV diastolic dysfunction, mild increased PA systolic pressure [id number] CXR--no active CP dz, PFTs:  Gold II criteria COPD--breo, severe obesity causing restrictive component

## 2013-09-08 ENCOUNTER — Telehealth: Payer: Self-pay | Admitting: Gastroenterology

## 2013-09-08 NOTE — Telephone Encounter (Signed)
Patient does not have a preference for new GI MD. Patient states he is having problems with belching and gas. States he has seen a pulmonary MD and his breathing is better than when he saw Dr. Sharlett Iles. He is interested in having the EGD now. Scheduled with Alonza Bogus, PA on 09/18/13 at 10:00 AM.

## 2013-09-10 ENCOUNTER — Ambulatory Visit: Payer: Commercial Managed Care - HMO | Admitting: Internal Medicine

## 2013-09-10 ENCOUNTER — Ambulatory Visit: Payer: Medicare PPO | Admitting: Internal Medicine

## 2013-09-18 ENCOUNTER — Ambulatory Visit (INDEPENDENT_AMBULATORY_CARE_PROVIDER_SITE_OTHER): Payer: Commercial Managed Care - HMO | Admitting: Gastroenterology

## 2013-09-18 ENCOUNTER — Encounter: Payer: Self-pay | Admitting: Gastroenterology

## 2013-09-18 VITALS — BP 122/70 | HR 64 | Ht 66.5 in | Wt 253.4 lb

## 2013-09-18 DIAGNOSIS — K219 Gastro-esophageal reflux disease without esophagitis: Secondary | ICD-10-CM

## 2013-09-18 DIAGNOSIS — R141 Gas pain: Secondary | ICD-10-CM

## 2013-09-18 DIAGNOSIS — R142 Eructation: Secondary | ICD-10-CM

## 2013-09-18 DIAGNOSIS — R143 Flatulence: Secondary | ICD-10-CM

## 2013-09-18 MED ORDER — PANTOPRAZOLE SODIUM 40 MG PO TBEC
40.0000 mg | DELAYED_RELEASE_TABLET | Freq: Every day | ORAL | Status: DC
Start: 2013-09-18 — End: 2014-11-10

## 2013-09-18 NOTE — Patient Instructions (Signed)
You have been scheduled for an endoscopy. Please follow written instructions given to you at your visit today. If you use inhalers (even only as needed), please bring them with you on the day of your procedure. Your physician has requested that you go to www.startemmi.com and enter the access code given to you at your visit today. This web site gives a general overview about your procedure. However, you should still follow specific instructions given to you by our office regarding your preparation for the procedure.  Stop Omeprazole and start Pantoprazole  Prescription for Pantoprazole 40 mg was sent to your pharmacy, please take one tablet by mouth once daily

## 2013-09-18 NOTE — Progress Notes (Addendum)
     09/18/2013 ADEOLA GEGG [id number] 11-Sep-1940   History of Present Illness:  This is a 73 year old male who was previously known to Dr. Sharlett Iles.  He was seen last year for heme positive stools.  He was complaining of gas/bloating/belching/reflux at that time, but EGD was not performed because his pulmonary status was poor at that time.  Instead, a CT scan of the abdomen and pelvis was ordered, but the patient never had it performed.  Also, his PPI was changed from omeprazole 20 mg daily to [place], but the patient does not recall ever taking that.  He is still currently on the omeprazole 20 mg daily.  He uses a CPAP machine at night.  His breath is much better.  He had a colonoscopy in 2013 in [place], [place] that was reportedly normal.  He is here today for the same complaints of belching/gas/bloating/reflux.  He admits to occasional dysphagia, but not frequently.  He's also had a cough for the past couple of weeks, but is unsure if it is related to the GI issues or not.  Says that when he burps it smells like rotten eggs.  He says that he has "phelgm" coming up all the time.   Current Medications, Allergies, Past Medical History, Past Surgical History, Family History and Social History were reviewed in [employer] record.   Physical Exam: BP 122/70  Pulse 64  Ht 5' 6.5" (1.689 m)  Wt 253 lb 6 oz (114.93 kg)  BMI 40.29 kg/m2 General: Well developed black male in no acute distress Head: Normocephalic and atraumatic Eyes:  Sclerae anicteric, conjunctiva pink  Ears: Normal auditory acuity Lungs: Clear throughout to auscultation Heart: Regular rate and rhythm Abdomen: Soft, non-distended.  Normal bowel sounds.  Non-tender. Musculoskeletal: Symmetrical with no gross deformities  Extremities: No edema  Neurological: Alert oriented x 4, grossly non-focal Psychological:  Alert and cooperative. Normal mood and affect  Assessment and  Recommendations: -GERD/belching:  Will schedule EGD for evaluation.  Rule out esophagitis, etc.  Will discontinue omeprazole 20 mg and started pantoprazole 40 mg daily instead.  The risks, benefits, and alternatives were discussed with the patient and he consents to proceed.  ? If his CPAP is contributing to his gas//belching.  Addendum: Reviewed and agree with initial management. Jerene Bears, MD

## 2013-09-28 ENCOUNTER — Encounter: Payer: Self-pay | Admitting: Internal Medicine

## 2013-09-28 ENCOUNTER — Ambulatory Visit (AMBULATORY_SURGERY_CENTER): Payer: Commercial Managed Care - HMO | Admitting: Internal Medicine

## 2013-09-28 VITALS — BP 124/69 | HR 92 | Temp 98.9°F | Resp 26 | Ht 66.5 in | Wt 253.0 lb

## 2013-09-28 DIAGNOSIS — K219 Gastro-esophageal reflux disease without esophagitis: Secondary | ICD-10-CM

## 2013-09-28 DIAGNOSIS — K2990 Gastroduodenitis, unspecified, without bleeding: Secondary | ICD-10-CM

## 2013-09-28 DIAGNOSIS — K2940 Chronic atrophic gastritis without bleeding: Secondary | ICD-10-CM

## 2013-09-28 DIAGNOSIS — R141 Gas pain: Secondary | ICD-10-CM

## 2013-09-28 DIAGNOSIS — K2970 Gastritis, unspecified, without bleeding: Secondary | ICD-10-CM

## 2013-09-28 DIAGNOSIS — R142 Eructation: Secondary | ICD-10-CM

## 2013-09-28 DIAGNOSIS — R143 Flatulence: Principal | ICD-10-CM

## 2013-09-28 LAB — GLUCOSE, CAPILLARY
GLUCOSE-CAPILLARY: 96 mg/dL (ref 70–99)
Glucose-Capillary: 93 mg/dL (ref 70–99)

## 2013-09-28 MED ORDER — SODIUM CHLORIDE 0.9 % IV SOLN: 500.0000 mL | INTRAVENOUS | Status: DC

## 2013-09-28 NOTE — Patient Instructions (Signed)
YOU HAD AN ENDOSCOPIC PROCEDURE TODAY AT [facility]: Refer to the procedure report that was given to you for any specific questions about what was found during the examination.  If the procedure report does not answer your questions, please call your gastroenterologist to clarify.  If you requested that your care partner not be given the details of your procedure findings, then the procedure report has been included in a sealed envelope for you to review at your convenience later.  YOU SHOULD EXPECT: Some feelings of bloating in the abdomen. Passage of more gas than usual.  Walking can help get rid of the air that was put into your GI tract during the procedure and reduce the bloating. If you had a lower endoscopy (such as a colonoscopy or flexible sigmoidoscopy) you may notice spotting of blood in your stool or on the toilet paper. If you underwent a bowel prep for your procedure, then you may not have a normal bowel movement for a few days.  DIET: Your first meal following the procedure should be a light meal and then it is ok to progress to your normal diet.  A half-sandwich or bowl of soup is an example of a good first meal.  Heavy or fried foods are harder to digest and may make you feel nauseous or bloated.  Likewise meals heavy in dairy and vegetables can cause extra gas to form and this can also increase the bloating.  Drink plenty of fluids but you should avoid alcoholic beverages for 24 hours.  ACTIVITY: Your care partner should take you home directly after the procedure.  You should plan to take it easy, moving slowly for the rest of the day.  You can resume normal activity the day after the procedure however you should NOT DRIVE or use heavy machinery for 24 hours (because of the sedation medicines used during the test).    SYMPTOMS TO REPORT IMMEDIATELY: A gastroenterologist can be reached at any hour.  During normal business hours, 8:30 AM to 5:00 PM Monday through Friday,  call [phone].  After hours and on weekends, please call the GI answering service at [phone] who will take a message and have the physician on call contact you.     Following upper endoscopy (EGD)  Vomiting of blood or coffee ground material  New chest pain or pain under the shoulder blades  Painful or persistently difficult swallowing  New shortness of breath  Fever of 100F or higher  Black, tarry-looking stools  FOLLOW UP: If any biopsies were taken you will be contacted by phone or by letter within the next 1-3 weeks.  Call your gastroenterologist if you have not heard about the biopsies in 3 weeks.  Our staff will call the home number listed on your records the next business day following your procedure to check on you and address any questions or concerns that you may have at that time regarding the information given to you following your procedure. This is a courtesy call and so if there is no answer at the home number and we have not heard from you through the emergency physician on call, we will assume that you have returned to your regular daily activities without incident.   Gastritis information given.  Willl treat Helicobacter pylori if positive.  Please call for appointment to see Dr Hilarie Fredrickson in 8 weeks.   Continue taking your pantoprazole 30mg . minutes before breafast each day.  SIGNATURES/CONFIDENTIALITY: You and/or your care partner  have signed paperwork which will be entered into your electronic medical record.  These signatures attest to the fact that that the information above on your After Visit Summary has been reviewed and is understood.  Full responsibility of the confidentiality of this discharge information lies with you and/or your care-partner.

## 2013-09-28 NOTE — Progress Notes (Signed)
Called to room to assist during endoscopic procedure.  Patient ID and intended procedure confirmed with present staff. Received instructions for my participation in the procedure from the performing physician.  

## 2013-09-28 NOTE — Progress Notes (Signed)
Report to PACU, RN, vss, BBS= Clear.  

## 2013-09-28 NOTE — Op Note (Signed)
[place]  [address] [place], [address]   ENDOSCOPY PROCEDURE REPORT  PATIENT: Omar Lambert, Omar Lambert  MR#: [id number] BIRTHDATE: 04/06/1940 , 73  yrs. old GENDER: Male ENDOSCOPIST: Jerene Bears, MD REFERRED BY:  [name], M.D. PROCEDURE DATE:  09/28/2013 PROCEDURE:  EGD w/ biopsy ASA CLASS:     Class III INDICATIONS:  Heartburn.   history of GERD.  eructation. MEDICATIONS: MAC sedation, administered by CRNA and propofol (Diprivan) 140mg  IV TOPICAL ANESTHETIC: none  DESCRIPTION OF PROCEDURE: After the risks benefits and alternatives of the procedure were thoroughly explained, informed consent was obtained.  The LB [id number] endoscope was introduced through the mouth and advanced to the second portion of the duodenum. Without limitations.  The instrument was slowly withdrawn as the mucosa was fully examined.  ESOPHAGUS: The mucosa of the esophagus appeared normal.  Z-line slightly variable at 42 cm.  STOMACH: Atrophic appearing gastritis (inflammation) was found in the gastric body and gastric antrum. In the antrum and pre-pyloric stomach there was mucosal thickening with areas of scaring possible related to prior ulcer disease. Multiple biopsies were performed from the pre-pyloric stomach, gastric body, antrum, and incisura.   DUODENUM: The duodenal mucosa showed no abnormalities in the bulb and second portion of the duodenum.  Retroflexed views revealed no abnormalities.     The scope was then withdrawn from the patient and the procedure completed.  COMPLICATIONS: There were no complications.  ENDOSCOPIC IMPRESSION: 1.   The mucosa of the esophagus appeared normal 2.   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   The duodenal mucosa showed no abnormalities in the bulb and second portion of the duodenum  RECOMMENDATIONS: 1.  Await pathology results 2.   Follow-up of helicobacter pylori status, treat if indicated 3.  Continue taking your PPI (pantoprazole) once daily.  It is best to be taken 20-30 minutes prior to breakfast meal. 4.  Office follow-up in about 8 weeks   eSigned:  Jerene Bears, MD 09/28/2013 10:14 AM CC:The Patient and [name], DO

## 2013-09-29 ENCOUNTER — Telehealth: Payer: Self-pay | Admitting: *Deleted

## 2013-09-29 ENCOUNTER — Encounter: Payer: Self-pay | Admitting: *Deleted

## 2013-09-29 ENCOUNTER — Other Ambulatory Visit: Payer: Self-pay | Admitting: *Deleted

## 2013-09-29 DIAGNOSIS — E119 Type 2 diabetes mellitus without complications: Secondary | ICD-10-CM

## 2013-09-29 NOTE — Telephone Encounter (Signed)
  Follow up Call-  Call back number 09/28/2013  Post procedure Call Back phone  # [phone]  Permission to leave phone message Yes     Patient questions:  Do you have a fever, pain , or abdominal swelling? No. Pain Score  0 *  Have you tolerated food without any problems? Yes.    Have you been able to return to your normal activities? Yes.    Do you have any questions about your discharge instructions: Diet   No. Medications  No. Follow up visit  No.  Do you have questions or concerns about your Care? No.  Actions: * If pain score is 4 or above: No action needed, pain <4.  Pt. Stated y'all did a wonderful job.

## 2013-10-13 ENCOUNTER — Other Ambulatory Visit: Payer: Commercial Managed Care - HMO

## 2013-10-13 DIAGNOSIS — E119 Type 2 diabetes mellitus without complications: Secondary | ICD-10-CM

## 2013-10-14 ENCOUNTER — Encounter: Payer: Self-pay | Admitting: Internal Medicine

## 2013-10-14 DIAGNOSIS — K2940 Chronic atrophic gastritis without bleeding: Secondary | ICD-10-CM | POA: Insufficient documentation

## 2013-10-14 LAB — BASIC METABOLIC PANEL
BUN/Creatinine Ratio: 14 (ref 10–22)
BUN: 29 mg/dL — ABNORMAL HIGH (ref 8–27)
CO2: 30 mmol/L — ABNORMAL HIGH (ref 18–29)
Calcium: 9.5 mg/dL (ref 8.6–10.2)
Chloride: 93 mmol/L — ABNORMAL LOW (ref 97–108)
Creatinine, Ser: 2.06 mg/dL — ABNORMAL HIGH (ref 0.76–1.27)
GFR calc Af Amer: 36 mL/min/{1.73_m2} — ABNORMAL LOW (ref >59)
GFR calc non Af Amer: 31 mL/min/{1.73_m2} — ABNORMAL LOW (ref >59)
Glucose: 97 mg/dL (ref 65–99)
Potassium: 4.2 mmol/L (ref 3.5–5.2)
Sodium: 139 mmol/L (ref 134–144)

## 2013-10-14 LAB — LIPID PANEL
Chol/HDL Ratio: 3.8 ratio units (ref 0.0–5.0)
Cholesterol, Total: 194 mg/dL (ref 100–199)
HDL: 51 mg/dL (ref >39)
LDL Calculated: 117 mg/dL — ABNORMAL HIGH (ref 0–99)
Triglycerides: 132 mg/dL (ref 0–149)
VLDL Cholesterol Cal: 26 mg/dL (ref 5–40)

## 2013-10-14 LAB — HEMOGLOBIN A1C
Est. average glucose Bld gHb Est-mCnc: 140 mg/dL
Hgb A1c MFr Bld: 6.5 % — ABNORMAL HIGH (ref 4.8–5.6)

## 2013-10-15 ENCOUNTER — Encounter: Payer: Self-pay | Admitting: Internal Medicine

## 2013-10-15 ENCOUNTER — Ambulatory Visit (INDEPENDENT_AMBULATORY_CARE_PROVIDER_SITE_OTHER): Payer: Commercial Managed Care - HMO | Admitting: Internal Medicine

## 2013-10-15 VITALS — BP 122/70 | HR 85 | Temp 98.0°F | Resp 18 | Ht 66.5 in | Wt 248.2 lb

## 2013-10-15 DIAGNOSIS — M1A9XX0 Chronic gout, unspecified, without tophus (tophi): Secondary | ICD-10-CM

## 2013-10-15 DIAGNOSIS — J320 Chronic maxillary sinusitis: Secondary | ICD-10-CM

## 2013-10-15 DIAGNOSIS — M1A00X0 Idiopathic chronic gout, unspecified site, without tophus (tophi): Secondary | ICD-10-CM

## 2013-10-15 DIAGNOSIS — G4733 Obstructive sleep apnea (adult) (pediatric): Secondary | ICD-10-CM

## 2013-10-15 DIAGNOSIS — N179 Acute kidney failure, unspecified: Secondary | ICD-10-CM

## 2013-10-15 DIAGNOSIS — Z9989 Dependence on other enabling machines and devices: Secondary | ICD-10-CM

## 2013-10-15 DIAGNOSIS — J449 Chronic obstructive pulmonary disease, unspecified: Secondary | ICD-10-CM

## 2013-10-15 DIAGNOSIS — M1A4790 Other secondary chronic gout, unspecified ankle and foot, without tophus (tophi): Secondary | ICD-10-CM

## 2013-10-15 DIAGNOSIS — K219 Gastro-esophageal reflux disease without esophagitis: Secondary | ICD-10-CM

## 2013-10-15 DIAGNOSIS — J4489 Other specified chronic obstructive pulmonary disease: Secondary | ICD-10-CM

## 2013-10-15 DIAGNOSIS — Z9109 Other allergy status, other than to drugs and biological substances: Secondary | ICD-10-CM

## 2013-10-15 DIAGNOSIS — E119 Type 2 diabetes mellitus without complications: Secondary | ICD-10-CM

## 2013-10-15 NOTE — Progress Notes (Signed)
Patient ID: Omar Lambert, male   DOB: [birth year], 73 y.o.   MRN: [id number]   Location:  [facility]  Code Status: discussed living will and hcpoa today  No Known Allergies  Chief Complaint  Patient presents with  . Medical Management of Chronic Issues    belching, ingestion,phlegm in throat(clear, thick), dizziness with movement    HPI: Patient is a 73 y.o. black male seen in the office today for med mgt of chronic diseases.  His wife notes that he has had increased difficulty with itching of his head.  She tells me he was getting injections by ENT for this.  Upon further discussion, it turns out he was getting allergy shots due to severe environmental allergies.  Gets dizzy and congested--has phlegm that won't turn loose.  Feels  swimmyheaded.    Still also has belching and gas that won't go away.  Says he is using his protonix as directed.  Is mostly at night after he lays down and lasts all through the night.  Sleeps on three pillows.  Dinner 6pm, sometimes drinks pepsi or kool aide before bed.  Discussed acidity.    Breathing has been alright.  Still not exercising.  They say they are going to go sign up today.  Stopped walking.  Discussed 20 mins a day to start or a few small intervals per day.    No more swelling or gout trouble since last visit.    Does not yet check his glucose.    He is in acute renal failure for some reason:  Stopped metformin as of yesterday and lisinopril due to this.  Sugar average had still increased.    Does not use CPAP faithfully.   Review of Systems:  Review of Systems  Constitutional: Positive for malaise/fatigue. Negative for fever and chills.  HENT: Positive for congestion.   Eyes: Negative for blurred vision.  Respiratory: Positive for cough and sputum production. Negative for shortness of breath.   Cardiovascular: Negative for chest pain, palpitations and leg swelling.  Gastrointestinal: Positive  for heartburn. Negative for constipation, blood in stool and melena.  Genitourinary: Negative for dysuria, urgency and frequency.  Musculoskeletal: Negative for falls and myalgias.  Skin: Negative for rash.  Neurological: Positive for dizziness. Negative for loss of consciousness and headaches.  Psychiatric/Behavioral: Negative for depression and memory loss.    Past Medical History  Diagnosis Date  . Benign essential hypertension   . Gout attack 09/2012  . Allergy   . Asthma   . Sinusitis, maxillary, chronic   . Type II or unspecified type diabetes mellitus without mention of complication, not stated as uncontrolled     Past Surgical History  Procedure Laterality Date  . Hernia repair  1980  . Colonoscopy  2003    Social History:   reports that he quit smoking about 16 years ago. His smoking use included Cigarettes. He has a 20 pack-year smoking history. He has never used smokeless tobacco. He reports that he does not drink alcohol or use illicit drugs.  Family History  Problem Relation Age of Onset  . Stroke Mother   . Cancer Sister   . Diabetes Sister   . COPD Sister   . COPD Sister     Medications: Patient's Medications  New Prescriptions   No medications on file  Previous Medications   ALBUTEROL (PROVENTIL) (2.5 MG/3ML) 0.083% NEBULIZER SOLUTION    Take 3 mLs (2.5 mg total) by nebulization  every 4 (four) hours as needed for wheezing. And asthma   ALLOPURINOL (ZYLOPRIM) 100 MG TABLET    Take 1 tablet (100 mg total) by mouth daily. For gout   ASPIRIN 81 MG TABLET    Take 81 mg by mouth daily.   COLCHICINE (COLCRYS) 0.6 MG TABLET    Take 1 tablet (0.6 mg total) by mouth daily. For gout for 6 months total only (finishes 09/08/13)   FLUTICASONE FUROATE-VILANTEROL (BREO ELLIPTA) 100-25 MCG/INH AEPB    Inhale 1 puff into the lungs every morning. For asthma   LISINOPRIL (PRINIVIL,ZESTRIL) 5 MG TABLET    Take 1 tablet (5 mg total) by mouth daily. For Blood Pressure    METFORMIN (GLUCOPHAGE) 500 MG TABLET    Take 0.5 tablets (250 mg total) by mouth daily with supper.   MONTELUKAST (SINGULAIR) 10 MG TABLET    Take 1 tablet (10 mg total) by mouth at bedtime. For asthma and allergies   MULTIPLE VITAMINS-MINERALS (CENTRUM SILVER ADULT 50+ PO)    Take 1 tablet by mouth daily.   PANTOPRAZOLE (PROTONIX) 40 MG TABLET    Take 1 tablet (40 mg total) by mouth daily.   TORSEMIDE (DEMADEX) 20 MG TABLET    Take 1 tablet twice daily for swelling (heart failure)  Modified Medications   No medications on file  Discontinued Medications   No medications on file     Physical Exam: Filed Vitals:   10/15/13 0824  BP: 122/70  Pulse: 85  Temp: 98 F (36.7 C)  TempSrc: Oral  Resp: 18  Height: 5' 6.5" (1.689 m)  Weight: 248 lb 3.2 oz (112.583 kg)  SpO2: 93%  Physical Exam  Constitutional: He is oriented to person, place, and time. He appears well-developed and well-nourished. No distress.  Obese black male  HENT:  Head: Normocephalic and atraumatic.  Eyes:  glasses  Cardiovascular: Normal rate, regular rhythm, normal heart sounds and intact distal pulses.   Pulmonary/Chest: Effort normal. No respiratory distress.  Coarse rhonchi  Abdominal: Soft. Bowel sounds are normal. He exhibits no distension and no mass. There is no tenderness.  Musculoskeletal: Normal range of motion. He exhibits no edema and no tenderness.  Neurological: He is alert and oriented to person, place, and time.  Skin: Skin is warm and dry.  Psychiatric: He has a normal mood and affect.    Labs reviewed: Basic Metabolic Panel:  Recent Labs  10/16/12 1047 11/18/12 1459 03/09/13 1506 06/09/13 0817 10/13/13 0853  NA 144 140 142 141 139  K 4.2 3.8 4.0 4.2 4.2  CL 99 98 100 95* 93*  CO2 33* 35* 28 25 30*  GLUCOSE 88 87 107* 76 97  BUN 19 14 13 13  29*  CREATININE 1.31* 1.3 1.13 1.30* 2.06*  CALCIUM 9.3 9.5 9.2 9.3 9.5  TSH 3.270 2.05  --   --   --    Liver Function Tests:  Recent  Labs  11/18/12 1459 03/09/13 1506 06/09/13 0817  AST 35 39 38  ALT 24 25 32  ALKPHOS 32* 42 55  BILITOT 0.8 0.4 0.4  PROT 7.2 6.6 7.2  ALBUMIN 4.0  --   --    No results found for this basename: LIPASE, AMYLASE,  in the last 8760 hours No results found for this basename: AMMONIA,  in the last 8760 hours CBC:  Recent Labs  11/18/12 1459 03/09/13 1506 06/09/13 0817  WBC 3.7* 5.4 5.4  NEUTROABS 1.7 3.2 3.4  HGB 13.3 12.7 12.8  HCT 40.8 38.7 40.7  MCV 90.5 92 92  PLT 176.0  --   --    Lipid Panel:  Recent Labs  10/13/13 0853  HDL 51  LDLCALC 117*  TRIG 132  CHOLHDL 3.8   Lab Results  Component Value Date   HGBA1C 6.5* 10/13/2013    Assessment/Plan 1. Acute renal failure, unspecified acute renal failure type -suspect this is due to his lisinopril and metformin that were just started  -d/c'd these and rechecked bmp today - Basic metabolic panel -when results return, we will call him to change his medication or order repeat lab and then schedule a f/u visit   2. Environmental allergies - was receiving shots in [place], [place] -does not know Doctor's name - advised that records could be transferred directly from one allergy office to the next - Ambulatory referral to Allergy--Dr. Fredderick Phenix at [place]  3. Diabetes mellitus type II, controlled -hba1c has gone up with low dose metformin -having acute renal failure so d/c'd -also d/c'd lisinopril due to acute renal failure -need to start on tradjenta or alternative when repeat bmp returns -encouraged water intake  4. 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 Severe obesity (BMI >= 40) -encouraged exercise for weight loss  6. OSA on CPAP -emphasized that he MUST use his CPAP so he does not die  7. Sinusitis, maxillary, chronic -should improve with allergy management consistently  8. Gastroesophageal reflux disease without  esophagitis -discussed conservative mgt of this in addition to faithful use of his protonix -must avoid sodas and kool aid before bed!  Not good for him anyway  9. COPD, moderate -chronic, stable, will need f/u of this once other issues are stable above  Refused flu shot Labs/tests ordered:   Orders Placed This Encounter  Procedures  . Basic metabolic panel  . Ambulatory referral to Allergy    Referral Priority:  Routine    Referral Type:  Allergy Testing    Referral Reason:  Specialty Services Required    Requested Specialty:  Allergy    Number of Visits Requested:  1    Next appt:  Will arrange based on lab results

## 2013-10-16 ENCOUNTER — Encounter: Payer: Self-pay | Admitting: Internal Medicine

## 2013-10-16 ENCOUNTER — Other Ambulatory Visit: Payer: Self-pay

## 2013-10-16 DIAGNOSIS — K2940 Chronic atrophic gastritis without bleeding: Secondary | ICD-10-CM

## 2013-10-16 LAB — BASIC METABOLIC PANEL
BUN/Creatinine Ratio: 17 (ref 10–22)
BUN: 34 mg/dL — ABNORMAL HIGH (ref 8–27)
CO2: 31 mmol/L — ABNORMAL HIGH (ref 18–29)
Calcium: 10.1 mg/dL (ref 8.6–10.2)
Chloride: 91 mmol/L — ABNORMAL LOW (ref 97–108)
Creatinine, Ser: 2.04 mg/dL — ABNORMAL HIGH (ref 0.76–1.27)
GFR calc Af Amer: 36 mL/min/{1.73_m2} — ABNORMAL LOW (ref >59)
GFR calc non Af Amer: 31 mL/min/{1.73_m2} — ABNORMAL LOW (ref >59)
Glucose: 111 mg/dL — ABNORMAL HIGH (ref 65–99)
Potassium: 4 mmol/L (ref 3.5–5.2)
Sodium: 141 mmol/L (ref 134–144)

## 2013-10-16 MED ORDER — BIS SUBCIT-METRONID-TETRACYC 140-125-125 MG PO CAPS: 3.0000 | ORAL_CAPSULE | Freq: Three times a day (TID) | ORAL | Status: DC

## 2013-10-19 ENCOUNTER — Other Ambulatory Visit: Payer: Self-pay | Admitting: Internal Medicine

## 2013-10-19 ENCOUNTER — Telehealth: Payer: Self-pay | Admitting: Internal Medicine

## 2013-10-19 DIAGNOSIS — E119 Type 2 diabetes mellitus without complications: Secondary | ICD-10-CM

## 2013-10-19 MED ORDER — AMOXICILL-CLARITHRO-LANSOPRAZ PO MISC: Freq: Two times a day (BID) | ORAL | Status: DC

## 2013-10-19 MED ORDER — LINAGLIPTIN 5 MG PO TABS: 5.0000 mg | ORAL_TABLET | Freq: Every day | ORAL | Status: DC

## 2013-10-19 NOTE — Telephone Encounter (Signed)
Dr Pyrtle, please advise.... 

## 2013-10-19 NOTE — Telephone Encounter (Signed)
I have d/c'ed rx for pylera and have sent prevpac in it's place. Patient has been made aware.

## 2013-10-19 NOTE — Progress Notes (Signed)
Stop metformin and ace inihibitor due to renal failure still present on labs.  Repeat bmp in a week.

## 2013-10-19 NOTE — Telephone Encounter (Signed)
prevpak

## 2013-10-20 ENCOUNTER — Other Ambulatory Visit: Payer: Self-pay | Admitting: Internal Medicine

## 2013-11-24 ENCOUNTER — Other Ambulatory Visit: Payer: Self-pay | Admitting: Internal Medicine

## 2013-11-24 ENCOUNTER — Ambulatory Visit: Payer: Commercial Managed Care - HMO | Admitting: Internal Medicine

## 2013-12-05 ENCOUNTER — Other Ambulatory Visit: Payer: Self-pay | Admitting: Internal Medicine

## 2013-12-07 ENCOUNTER — Other Ambulatory Visit: Payer: Self-pay | Admitting: *Deleted

## 2013-12-07 MED ORDER — ALBUTEROL SULFATE (2.5 MG/3ML) 0.083% IN NEBU
2.5000 mg | INHALATION_SOLUTION | RESPIRATORY_TRACT | Status: DC | PRN
Start: 2013-12-07 — End: 2015-01-13

## 2013-12-08 ENCOUNTER — Other Ambulatory Visit: Payer: Self-pay | Admitting: *Deleted

## 2013-12-08 MED ORDER — MONTELUKAST SODIUM 10 MG PO TABS: ORAL_TABLET | ORAL | Status: DC

## 2013-12-08 NOTE — Telephone Encounter (Signed)
[facility] 

## 2014-01-28 ENCOUNTER — Ambulatory Visit (INDEPENDENT_AMBULATORY_CARE_PROVIDER_SITE_OTHER): Payer: Commercial Managed Care - HMO | Admitting: Internal Medicine

## 2014-01-28 ENCOUNTER — Encounter: Payer: Self-pay | Admitting: Internal Medicine

## 2014-01-28 VITALS — BP 126/84 | HR 83 | Temp 97.7°F | Resp 18 | Ht 67.0 in | Wt 248.8 lb

## 2014-01-28 DIAGNOSIS — Z91148 Patient's other noncompliance with medication regimen for other reason: Secondary | ICD-10-CM

## 2014-01-28 DIAGNOSIS — Z9114 Patient's other noncompliance with medication regimen: Secondary | ICD-10-CM

## 2014-01-28 DIAGNOSIS — I872 Venous insufficiency (chronic) (peripheral): Secondary | ICD-10-CM

## 2014-01-28 DIAGNOSIS — Z23 Encounter for immunization: Secondary | ICD-10-CM

## 2014-01-28 DIAGNOSIS — M1A4790 Other secondary chronic gout, unspecified ankle and foot, without tophus (tophi): Secondary | ICD-10-CM

## 2014-01-28 DIAGNOSIS — J449 Chronic obstructive pulmonary disease, unspecified: Secondary | ICD-10-CM

## 2014-01-28 DIAGNOSIS — E119 Type 2 diabetes mellitus without complications: Secondary | ICD-10-CM

## 2014-01-28 NOTE — Progress Notes (Signed)
Patient ID: Omar Lambert, male   DOB: Jan 02, 1941, 73 y.o.   MRN: [id number]   Location:  [facility] / [facility]  Code Status: he and his wife have still not brought in completed advance directives  No Known Allergies  Chief Complaint  Patient presents with  . Medical Management of Chronic Issues    HPI: Patient is a 73 y.o. black male seen in the office today for med mgt of chronic diseases.  BP at goal today  Was treated with prev pack in September.  Completed he says.  Did not f/u with GI due to cost of copay.  Still has some reflux, but stomach "ok"--less bloated.  Took tradjenta for a month and didn't refill.  No reason, just didn't  Has had congestion and had a cold, but better.    Had eye exam done last Friday at Dr. Zenia Resides.  Review of Systems:  Review of Systems  Constitutional: Negative for malaise/fatigue.  HENT: Positive for congestion.   Eyes: Negative for blurred vision.  Respiratory: Positive for cough. Negative for shortness of breath.   Cardiovascular: Negative for chest pain.  Gastrointestinal: Negative for abdominal pain.  Genitourinary: Negative for dysuria.  Musculoskeletal: Negative for myalgias and falls.  Skin: Negative for rash.  Neurological: Negative for dizziness and loss of consciousness.  Endo/Heme/Allergies: Does not bruise/bleed easily.  Psychiatric/Behavioral: Negative for depression and memory loss.     Past Medical History  Diagnosis Date  . Benign essential hypertension   . Gout attack 09/2012  . Allergy   . Asthma   . Sinusitis, maxillary, chronic   . Type II or unspecified type diabetes mellitus without mention of complication, not stated as uncontrolled     Past Surgical History  Procedure Laterality Date  . Hernia repair  1980  . Colonoscopy  2003    Social History:   reports that he quit smoking about 16 years ago. His smoking use included Cigarettes. He has a 20 pack-year smoking  history. He has never used smokeless tobacco. He reports that he does not drink alcohol or use illicit drugs.  Family History  Problem Relation Age of Onset  . Stroke Mother   . Cancer Sister   . Diabetes Sister   . COPD Sister   . COPD Sister     Medications: Patient's Medications  New Prescriptions   No medications on file  Previous Medications   ALBUTEROL (PROVENTIL) (2.5 MG/3ML) 0.083% NEBULIZER SOLUTION    Take 3 mLs (2.5 mg total) by nebulization every 4 (four) hours as needed for wheezing. And asthma   ALLOPURINOL (ZYLOPRIM) 100 MG TABLET    Take 1 tablet (100 mg total) by mouth daily. For gout   AMOXICILLIN-CLARITHROMYCIN-LANSOPRAZOLE (PREVPAC) COMBO PACK    Take by mouth 2 (two) times daily. Follow package directions.   ASPIRIN 81 MG TABLET    Take 81 mg by mouth daily.   COLCHICINE 0.6 MG TABLET    TAKE 1 TABLET EVERY DAY   FLUTICASONE FUROATE-VILANTEROL (BREO ELLIPTA) 100-25 MCG/INH AEPB    Inhale 1 puff into the lungs every morning. For asthma   LINAGLIPTIN (TRADJENTA) 5 MG TABS TABLET    Take 1 tablet (5 mg total) by mouth daily.   LISINOPRIL (PRINIVIL,ZESTRIL) 5 MG TABLET    TAKE 1 TABLET EVERY DAY FOR BLOOD PRESSURE   MONTELUKAST (SINGULAIR) 10 MG TABLET    Take one tablet by mouth at bedtime as needed for allergies and asthma  MULTIPLE VITAMINS-MINERALS (CENTRUM SILVER ADULT 50+ PO)    Take 1 tablet by mouth daily.   OMEPRAZOLE (PRILOSEC) 20 MG CAPSULE    TAKE 1 CAPSULE EVERY DAY   PANTOPRAZOLE (PROTONIX) 40 MG TABLET    Take 1 tablet (40 mg total) by mouth daily.   TORSEMIDE (DEMADEX) 20 MG TABLET    Take 1 tablet twice daily for swelling (heart failure)  Modified Medications   No medications on file  Discontinued Medications   No medications on file     Physical Exam: Filed Vitals:   01/28/14 1001  BP: 126/84  Pulse: 83  Temp: 97.7 F (36.5 C)  TempSrc: Oral  Resp: 18  Height: 5\' 7"  (1.702 m)  Weight: 248 lb 12.8 oz (112.855 kg)  SpO2: 95%  Physical  Exam  Constitutional: He is oriented to person, place, and time. He appears well-developed and well-nourished.  Obese black male  Cardiovascular: Normal rate and regular rhythm.   Murmur heard. Pulmonary/Chest: Effort normal and breath sounds normal. No respiratory distress. He has no rales.  Abdominal: Soft. Bowel sounds are normal. He exhibits no distension and no mass. There is no tenderness.  Musculoskeletal: Normal range of motion.  Neurological: He is alert and oriented to person, place, and time.  Skin: Skin is warm and dry.    Labs reviewed: Basic Metabolic Panel:  Recent Labs  06/09/13 0817 10/13/13 0853 10/15/13 0937  NA 141 139 141  K 4.2 4.2 4.0  CL 95* 93* 91*  CO2 25 30* 31*  GLUCOSE 76 97 111*  BUN 13 29* 34*  CREATININE 1.30* 2.06* 2.04*  CALCIUM 9.3 9.5 10.1   Liver Function Tests:  Recent Labs  03/09/13 1506 06/09/13 0817  AST 39 38  ALT 25 32  ALKPHOS 42 55  BILITOT 0.4 0.4  PROT 6.6 7.2  CBC:  Recent Labs  03/09/13 1506 06/09/13 0817  WBC 5.4 5.4  NEUTROABS 3.2 3.4  HGB 12.7 12.8  HCT 38.7 40.7  MCV 92 92   Lipid Panel:  Recent Labs  10/13/13 0853  HDL 51  LDLCALC 117*  TRIG 132  CHOLHDL 3.8   Lab Results  Component Value Date   HGBA1C 6.5* 10/13/2013   Assessment/Plan 1. Diabetes mellitus type II, controlled - restart tradjenta (took for one mo and did not refill) - CBC With differential/Platelet - Comprehensive metabolic panel - Hemoglobin A1c  2. Severe obesity (BMI >= 40) -weight unchanged, still not exercising   3. COPD, moderate -cont singulair, breo, proventil  4. Venous insufficiency of both lower extremities -cont demadex as ordered, low sodium diet, elevating feet  5. 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 Need for vaccination with 13-polyvalent pneumococcal conjugate vaccine -prevnar  given  7.  Noncompliance with medication regimen -not taking meds as prescribed.   -indications written out and meds reconciled -not to be on ace due to renal failure from ace -colchicine was to be for 6 mos only  Labs/tests ordered: Orders Placed This Encounter  Procedures  . CBC With differential/Platelet  . Comprehensive metabolic panel  . Hemoglobin A1c  . Uric Acid    Next appt:  3 mos   Daviana Haymaker L. Lyrical Sowle, D.O. [facility] [address], [place] [address] Cell Phone (Mon-Fri 8am-5pm):  [phone] On Call:  [phone] & follow prompts  after 5pm & weekends Office Phone:  [phone] Office Fax:  [phone]

## 2014-01-28 NOTE — Addendum Note (Signed)
Addended by: Eilene Ghazi on: 01/28/2014 12:15 PM   Modules accepted: Orders

## 2014-01-29 LAB — HEMOGLOBIN A1C
Est. average glucose Bld gHb Est-mCnc: 128 mg/dL
Hgb A1c MFr Bld: 6.1 % — ABNORMAL HIGH (ref 4.8–5.6)

## 2014-01-29 LAB — COMPREHENSIVE METABOLIC PANEL
ALT: 19 IU/L (ref 0–44)
AST: 26 IU/L (ref 0–40)
Albumin/Globulin Ratio: 1.5 (ref 1.1–2.5)
Albumin: 4.3 g/dL (ref 3.5–4.8)
Alkaline Phosphatase: 50 IU/L (ref 39–117)
BUN/Creatinine Ratio: 10 (ref 10–22)
BUN: 13 mg/dL (ref 8–27)
CO2: 31 mmol/L — ABNORMAL HIGH (ref 18–29)
Calcium: 8.9 mg/dL (ref 8.6–10.2)
Chloride: 98 mmol/L (ref 97–108)
Creatinine, Ser: 1.28 mg/dL — ABNORMAL HIGH (ref 0.76–1.27)
GFR calc Af Amer: 63 mL/min/{1.73_m2} (ref >59)
GFR calc non Af Amer: 55 mL/min/{1.73_m2} — ABNORMAL LOW (ref >59)
Globulin, Total: 2.8 g/dL (ref 1.5–4.5)
Glucose: 89 mg/dL (ref 65–99)
Potassium: 4 mmol/L (ref 3.5–5.2)
Sodium: 144 mmol/L (ref 134–144)
Total Bilirubin: 0.8 mg/dL (ref 0.0–1.2)
Total Protein: 7.1 g/dL (ref 6.0–8.5)

## 2014-01-29 LAB — CBC WITH DIFFERENTIAL
Basophils Absolute: 0 10*3/uL (ref 0.0–0.2)
Basos: 0 %
Eos: 4 %
Eosinophils Absolute: 0.2 10*3/uL (ref 0.0–0.4)
HCT: 43.3 % (ref 37.5–51.0)
Hemoglobin: 14.6 g/dL (ref 12.6–17.7)
Immature Grans (Abs): 0 10*3/uL (ref 0.0–0.1)
Immature Granulocytes: 0 %
Lymphocytes Absolute: 1.2 10*3/uL (ref 0.7–3.1)
Lymphs: 26 %
MCH: 30 pg (ref 26.6–33.0)
MCHC: 33.7 g/dL (ref 31.5–35.7)
MCV: 89 fL (ref 79–97)
Monocytes Absolute: 0.5 10*3/uL (ref 0.1–0.9)
Monocytes: 10 %
Neutrophils Absolute: 2.7 10*3/uL (ref 1.4–7.0)
Neutrophils Relative %: 60 %
Platelets: 194 10*3/uL (ref 150–379)
RBC: 4.86 x10E6/uL (ref 4.14–5.80)
RDW: 13.6 % (ref 12.3–15.4)
WBC: 4.5 10*3/uL (ref 3.4–10.8)

## 2014-01-29 LAB — URIC ACID: Uric Acid: 10.8 mg/dL — ABNORMAL HIGH (ref 3.7–8.6)

## 2014-02-05 DIAGNOSIS — K2940 Chronic atrophic gastritis without bleeding: Secondary | ICD-10-CM

## 2014-02-05 HISTORY — DX: Chronic atrophic gastritis without bleeding: K29.40

## 2014-02-15 ENCOUNTER — Encounter: Payer: Self-pay | Admitting: Internal Medicine

## 2014-02-15 ENCOUNTER — Ambulatory Visit (INDEPENDENT_AMBULATORY_CARE_PROVIDER_SITE_OTHER): Payer: Commercial Managed Care - HMO | Admitting: Internal Medicine

## 2014-02-15 VITALS — BP 138/80 | HR 111 | Temp 97.5°F | Resp 20 | Ht 67.0 in | Wt 251.6 lb

## 2014-02-15 DIAGNOSIS — M1A9XX0 Chronic gout, unspecified, without tophus (tophi): Secondary | ICD-10-CM

## 2014-02-15 DIAGNOSIS — Z9114 Patient's other noncompliance with medication regimen: Secondary | ICD-10-CM

## 2014-02-15 DIAGNOSIS — J449 Chronic obstructive pulmonary disease, unspecified: Secondary | ICD-10-CM

## 2014-02-15 DIAGNOSIS — E119 Type 2 diabetes mellitus without complications: Secondary | ICD-10-CM

## 2014-02-15 DIAGNOSIS — Z9109 Other allergy status, other than to drugs and biological substances: Secondary | ICD-10-CM | POA: Insufficient documentation

## 2014-02-15 DIAGNOSIS — Z91148 Patient's other noncompliance with medication regimen for other reason: Secondary | ICD-10-CM

## 2014-02-15 DIAGNOSIS — I872 Venous insufficiency (chronic) (peripheral): Secondary | ICD-10-CM

## 2014-02-15 DIAGNOSIS — K2940 Chronic atrophic gastritis without bleeding: Secondary | ICD-10-CM

## 2014-02-15 DIAGNOSIS — Z91048 Other nonmedicinal substance allergy status: Secondary | ICD-10-CM

## 2014-02-15 NOTE — Progress Notes (Signed)
Patient ID: Omar Lambert, male   DOB: 02-Nov-1940, 74 y.o.   MRN: [id number]   Location:  [facility]  Code Status: still have not done advance directive paperwork   No Known Allergies  Chief Complaint  Patient presents with  . Acute Visit    phlegm build-up     HPI: Patient is a 74 y.o. seen in the office today for acute visit due to phlegm build-up.  Has been coughing up greenish-brownish sputum for  Several weeks, but worst in the past week.  Is in throat and upper chest.  Felt chills off and on.   Drinks pretty well.  Is drinking less than he used to.  Had been having headache and dizziness last week.  Right ear pressure sometimes.  Some sneezing.  Watery eyes.  Sometimes itchy, too.  No diarrhea.  No sick contacts.  Was blaming on pneumonia shot.  Did have rash around the area of the shot.    They have been watching his grandson and that's prevented them from going to [place] to pick up the allergy records so he can see [facility] for allergy shots.  Review of Systems:  Review of Systems  Constitutional: Positive for chills and malaise/fatigue. Negative for fever and weight loss.  HENT: Positive for congestion. Negative for ear pain, sore throat and tinnitus.   Eyes: Negative for blurred vision.  Respiratory: Positive for cough and sputum production.   Cardiovascular: Positive for leg swelling. Negative for chest pain.  Gastrointestinal: Negative for nausea, vomiting, abdominal pain, diarrhea, constipation, blood in stool and melena.  Genitourinary: Negative for dysuria.  Musculoskeletal: Negative for myalgias and falls.  Neurological: Positive for headaches. Negative for dizziness, loss of consciousness and weakness.  Psychiatric/Behavioral: Negative for depression.     Past Medical History  Diagnosis Date  . Benign essential hypertension   . Gout attack 09/2012  . Allergy   . Asthma   . Sinusitis, maxillary, chronic   . Type II  or unspecified type diabetes mellitus without mention of complication, not stated as uncontrolled     Past Surgical History  Procedure Laterality Date  . Hernia repair  1980  . Colonoscopy  2003    Social History:   reports that he quit smoking about 16 years ago. His smoking use included Cigarettes. He has a 20 pack-year smoking history. He has never used smokeless tobacco. He reports that he does not drink alcohol or use illicit drugs.  Family History  Problem Relation Age of Onset  . Stroke Mother   . Cancer Sister   . Diabetes Sister   . COPD Sister   . COPD Sister     Medications: Patient's Medications  New Prescriptions   No medications on file  Previous Medications   ALBUTEROL (PROVENTIL) (2.5 MG/3ML) 0.083% NEBULIZER SOLUTION    Take 3 mLs (2.5 mg total) by nebulization every 4 (four) hours as needed for wheezing. And asthma   ALLOPURINOL (ZYLOPRIM) 100 MG TABLET    Take 1 tablet (100 mg total) by mouth daily. For gout   ASPIRIN 81 MG TABLET    Take 81 mg by mouth daily.   FLUTICASONE FUROATE-VILANTEROL (BREO ELLIPTA) 100-25 MCG/INH AEPB    Inhale 1 puff into the lungs every morning. For asthma   LINAGLIPTIN (TRADJENTA) 5 MG TABS TABLET    Take 1 tablet (5 mg total) by mouth daily.   MONTELUKAST (SINGULAIR) 10 MG TABLET    Take one  tablet by mouth at bedtime as needed for allergies and asthma   MULTIPLE VITAMINS-MINERALS (CENTRUM SILVER ADULT 50+ PO)    Take 1 tablet by mouth daily.   PANTOPRAZOLE (PROTONIX) 40 MG TABLET    Take 1 tablet (40 mg total) by mouth daily.   TORSEMIDE (DEMADEX) 20 MG TABLET    Take 1 tablet twice daily for swelling (heart failure)  Modified Medications   No medications on file  Discontinued Medications   No medications on file     Physical Exam: Filed Vitals:   02/15/14 1140  BP: 138/80  Pulse: 111  Temp: 97.5 F (36.4 C)  TempSrc: Oral  Resp: 20  Height: 5\' 7"  (1.702 m)  Weight: 251 lb 9.6 oz (114.125 kg)  SpO2: 76%  Physical  Exam  Constitutional: He is oriented to person, place, and time. He appears well-developed and well-nourished. No distress.  Cardiovascular: Normal rate, regular rhythm, normal heart sounds and intact distal pulses.   Pulmonary/Chest: Effort normal and breath sounds normal. No respiratory distress.  A few audible rhonchi anteriorly and mild wheezes  Abdominal: Soft. Bowel sounds are normal. He exhibits no distension and no mass. There is no tenderness.  Musculoskeletal: Normal range of motion.  Neurological: He is alert and oriented to person, place, and time.  Skin: Skin is warm and dry.     Labs reviewed: Basic Metabolic Panel:  Recent Labs  10/13/13 0853 10/15/13 0937 01/28/14 1131  NA 139 141 144  K 4.2 4.0 4.0  CL 93* 91* 98  CO2 30* 31* 31*  GLUCOSE 97 111* 89  BUN 29* 34* 13  CREATININE 2.06* 2.04* 1.28*  CALCIUM 9.5 10.1 8.9   Liver Function Tests:  Recent Labs  03/09/13 1506 06/09/13 0817 01/28/14 1131  AST 39 38 26  ALT 25 32 19  ALKPHOS 42 55 50  BILITOT 0.4 0.4 0.8  PROT 6.6 7.2 7.1   No results for input(s): LIPASE, AMYLASE in the last 8760 hours. No results for input(s): AMMONIA in the last 8760 hours. CBC:  Recent Labs  03/09/13 1506 06/09/13 0817 01/28/14 1131  WBC 5.4 5.4 4.5  NEUTROABS 3.2 3.4 2.7  HGB 12.7 12.8 14.6  HCT 38.7 40.7 43.3  MCV 92 92 89  PLT  --   --  194   Lipid Panel:  Recent Labs  10/13/13 0853  HDL 51  LDLCALC 117*  TRIG 132  CHOLHDL 3.8   Lab Results  Component Value Date   HGBA1C 6.1* 01/28/2014    Assessment/Plan 1. Environmental allergies -cont singulair and get records from [place] to bring to [facility] here for treatment - CBC With differential/Platelet; Future  2. Diabetes mellitus type II, controlled - once again recommended exercise and dietary changes, but he does not make these changes, f/u labs again before next visit - Basic metabolic panel; Future - Lipid panel; Future -  Hemoglobin A1c; Future  3. COPD, moderate -seems that his URI from last week has gotten much better and may have been viral complicating allergic symptoms -encouraged hydration, rest, neb use - CBC With differential/Platelet; Future  4. 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 Hilarie Fredrickson as directed--reminded to do this again today   6. Venous insufficiency of both lower extremities -ongoing, he is to elevate his feet at rest  7. Chronic gout without tophus, unspecified cause, unspecified site - cont allopurinol, probably diuretic induced, but needs these -f/u labs: - Uric Acid; Future  Labs/tests ordered: Orders Placed This Encounter  Procedures  . CBC With differential/Platelet    Standing Status: Future     Number of Occurrences:      Standing Expiration Date: 08/16/2014  . Basic metabolic panel    Standing Status: Future     Number of Occurrences:      Standing Expiration Date: 08/16/2014    Order Specific Question:  Has the patient fasted?    Answer:  Yes  . Lipid panel    Standing Status: Future     Number of Occurrences:      Standing Expiration Date: 08/16/2014    Order Specific Question:  Has the patient fasted?    Answer:  Yes  . Hemoglobin A1c    Standing Status: Future     Number of Occurrences:      Standing Expiration Date: 08/16/2014  . Uric Acid    Standing Status: Future     Number of Occurrences:      Standing Expiration Date: 08/16/2014    Next appt:  3 mos with labs before  Rilynn Habel L. Merton Wadlow, D.O. [facility] [address], [place] [address] Cell Phone (Mon-Fri 8am-5pm):  [phone] On Call:  [phone] & follow prompts after 5pm & weekends Office Phone:  [phone] Office Fax:   [phone]

## 2014-03-31 ENCOUNTER — Ambulatory Visit (INDEPENDENT_AMBULATORY_CARE_PROVIDER_SITE_OTHER): Payer: Commercial Managed Care - HMO | Admitting: Internal Medicine

## 2014-03-31 ENCOUNTER — Encounter: Payer: Self-pay | Admitting: Internal Medicine

## 2014-03-31 VITALS — BP 138/88 | HR 68 | Temp 97.9°F | Resp 12 | Ht 67.0 in | Wt 257.0 lb

## 2014-03-31 DIAGNOSIS — E119 Type 2 diabetes mellitus without complications: Secondary | ICD-10-CM

## 2014-03-31 DIAGNOSIS — K2940 Chronic atrophic gastritis without bleeding: Secondary | ICD-10-CM | POA: Diagnosis not present

## 2014-03-31 DIAGNOSIS — J449 Chronic obstructive pulmonary disease, unspecified: Secondary | ICD-10-CM | POA: Diagnosis not present

## 2014-03-31 DIAGNOSIS — J301 Allergic rhinitis due to pollen: Secondary | ICD-10-CM | POA: Diagnosis not present

## 2014-03-31 MED ORDER — METHYLPREDNISOLONE ACETATE 40 MG/ML IJ SUSP
40.0000 mg | Freq: Once | INTRAMUSCULAR | Status: AC
  Administered 2014-03-31: 40 mg via INTRAMUSCULAR

## 2014-03-31 NOTE — Patient Instructions (Signed)
Take all medications as prescribed  Start claritin daily for seasonal allergy  Recommend he contacts the makers of Breo for prescription assistance  Follow up as scheduled with Dr Mariea Clonts

## 2014-03-31 NOTE — Progress Notes (Signed)
Patient ID: Omar Lambert, male   DOB: 03/27/1940, 74 y.o.   MRN: [id number]      Facility  [facility]    Place of Service:   OFFICE   No Known Allergies  Chief Complaint  Patient presents with  . Referral    Patient in office today to request referral to [place] (pending appointment 05/13/14). Patient also needs referral to ENT (never seen one in [place])  . URI    "Head is stuffy"    HPI:  74 yo male seen today for above. He needs to f/u with GI regarding abdominal pain/gastritis. He needs a referral.  He also requests to see ENT for allergy injections. He was able to retrieve records from [place], [place] since his last OV. His head feels stuffy today. He reports post nasal drip, HA, dizziness, ear pressure, intermittent nausea, sleep disturbance (due to abdominal gas/belching). No sore throat, change in appetite, or cough. Takes claritin and singulair.  He admits to not taking medications as Rx. He is having trouble affording Breo due to his fixed income.  He does not check his BS at home. Medications: Patient's Medications  New Prescriptions   No medications on file  Previous Medications   ALBUTEROL (PROVENTIL) (2.5 MG/3ML) 0.083% NEBULIZER SOLUTION    Take 3 mLs (2.5 mg total) by nebulization every 4 (four) hours as needed for wheezing. And asthma   ALLOPURINOL (ZYLOPRIM) 100 MG TABLET    Take 1 tablet (100 mg total) by mouth daily. For gout   ASPIRIN 81 MG TABLET    Take 81 mg by mouth daily.   FLUTICASONE FUROATE-VILANTEROL (BREO ELLIPTA) 100-25 MCG/INH AEPB    Inhale 1 puff into the lungs every morning. For asthma   LINAGLIPTIN (TRADJENTA) 5 MG TABS TABLET    Take 1 tablet (5 mg total) by mouth daily.   MONTELUKAST (SINGULAIR) 10 MG TABLET    Take one tablet by mouth at bedtime as needed for allergies and asthma   MULTIPLE VITAMINS-MINERALS (CENTRUM SILVER ADULT 50+ PO)    Take 1 tablet by mouth daily.   PANTOPRAZOLE (PROTONIX) 40 MG TABLET    Take 1 tablet (40 mg total) by  mouth daily.   TORSEMIDE (DEMADEX) 20 MG TABLET    Take 1 tablet twice daily for swelling (heart failure)  Modified Medications   No medications on file  Discontinued Medications   No medications on file     Review of Systems As above. All other systems reviewed are negative.  Filed Vitals:   03/31/14 1513  BP: 138/88  Pulse: 68  Temp: 97.9 F (36.6 C)  TempSrc: Oral  Resp: 12  Height: 5\' 7"  (1.702 m)  Weight: 257 lb (116.574 kg)  SpO2: 91%   Body mass index is 40.24 kg/(m^2).  Physical Exam CONSTITUTIONAL: Looks well in NAD. Awake, alert and oriented x 3 HEENT: PERRLA. No sinus TTP. Oropharynx cobblestoning and red but no exudate NECK: Supple. Nontender. No palpable cervical or supraclavicular lymph nodes.  CVS: Regular rate without murmur, gallop or rub. LUNGS: reduced BS at base but CTA b/l no wheezing, rales or rhonchi. EXTREMITIES: No edema b/l.   Labs reviewed: Office Visit on 01/28/2014  Component Date Value Ref Range Status  . WBC 01/28/2014 4.5  3.4 - 10.8 x10E3/uL Final  . RBC 01/28/2014 4.86  4.14 - 5.80 x10E6/uL Final  . Hemoglobin 01/28/2014 14.6  12.6 - 17.7 g/dL Final  . HCT 01/28/2014 43.3  37.5 - 51.0 % Final  .  MCV 01/28/2014 89  79 - 97 fL Final  . MCH 01/28/2014 30.0  26.6 - 33.0 pg Final  . MCHC 01/28/2014 33.7  31.5 - 35.7 g/dL Final  . RDW 01/28/2014 13.6  12.3 - 15.4 % Final  . Platelets 01/28/2014 194  150 - 379 x10E3/uL Final  . Neutrophils Relative % 01/28/2014 60   Final  . Lymphs 01/28/2014 26   Final  . Monocytes 01/28/2014 10   Final  . Eos 01/28/2014 4   Final  . Basos 01/28/2014 0   Final  . Neutrophils Absolute 01/28/2014 2.7  1.4 - 7.0 x10E3/uL Final  . Lymphocytes Absolute 01/28/2014 1.2  0.7 - 3.1 x10E3/uL Final  . Monocytes Absolute 01/28/2014 0.5  0.1 - 0.9 x10E3/uL Final  . Eosinophils Absolute 01/28/2014 0.2  0.0 - 0.4 x10E3/uL Final  . Basophils Absolute 01/28/2014 0.0  0.0 - 0.2 x10E3/uL Final  . Immature Granulocytes  01/28/2014 0   Final  . Immature Grans (Abs) 01/28/2014 0.0  0.0 - 0.1 x10E3/uL Final  . Glucose 01/28/2014 89  65 - 99 mg/dL Final  . BUN 01/28/2014 13  8 - 27 mg/dL Final  . Creatinine, Ser 01/28/2014 1.28* 0.76 - 1.27 mg/dL Final  . GFR calc non Af Amer 01/28/2014 55* >59 mL/min/1.73 Final  . GFR calc Af Amer 01/28/2014 63  >59 mL/min/1.73 Final  . BUN/Creatinine Ratio 01/28/2014 10  10 - 22 Final  . Sodium 01/28/2014 144  134 - 144 mmol/L Final  . Potassium 01/28/2014 4.0  3.5 - 5.2 mmol/L Final  . Chloride 01/28/2014 98  97 - 108 mmol/L Final  . CO2 01/28/2014 31* 18 - 29 mmol/L Final  . Calcium 01/28/2014 8.9  8.6 - 10.2 mg/dL Final  . Total Protein 01/28/2014 7.1  6.0 - 8.5 g/dL Final  . Albumin 01/28/2014 4.3  3.5 - 4.8 g/dL Final  . Globulin, Total 01/28/2014 2.8  1.5 - 4.5 g/dL Final  . Albumin/Globulin Ratio 01/28/2014 1.5  1.1 - 2.5 Final  . Total Bilirubin 01/28/2014 0.8  0.0 - 1.2 mg/dL Final  . Alkaline Phosphatase 01/28/2014 50  39 - 117 IU/L Final  . AST 01/28/2014 26  0 - 40 IU/L Final  . ALT 01/28/2014 19  0 - 44 IU/L Final  . Hgb A1c MFr Bld 01/28/2014 6.1* 4.8 - 5.6 % Final   Comment:          Pre-diabetes: 5.7 - 6.4          Diabetes: >6.4          Glycemic control for adults with diabetes: <7.0   . Est. average glucose Bld gHb Est-m* 01/28/2014 128   Final  . Uric Acid 01/28/2014 10.8* 3.7 - 8.6 mg/dL Final              Therapeutic target for gout patients: <6.0     Assessment/Plan   ICD-9-CM ICD-10-CM   1. 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 Allergic rhinitis due to pollen 477.0 J30.1 Ambulatory referral to ENT     methylPREDNISolone acetate (DEPO-MEDROL) injection 40 mg  3. COPD, moderate 496 J44.9   4. Diabetes mellitus type II, controlled 250.00 E11.9      --Take all medications as prescribed  --Start claritin daily for seasonal allergy  --Recommend he contacts the makers of Breo for prescription  assistance  --Follow up as scheduled with Dr Lorelei Pont S. Eulas Post, D. O., F. [place]  [facility] [address] [place], [place] [address] [phone] Office (Wednesdays and Fridays 8 AM - 5 PM) [phone] Cell (Monday-Friday 8 AM - 5 PM)

## 2014-04-23 ENCOUNTER — Telehealth: Payer: Self-pay | Admitting: *Deleted

## 2014-04-23 NOTE — Telephone Encounter (Signed)
Patient called and wanted to know if he could get another shot for his allergies like you gave him at his last appointment. Having some vertigo and allergies. Doesn't want to come in unless he can have the shot. Please Advise.

## 2014-04-23 NOTE — Telephone Encounter (Signed)
I do not recommend back to back injections. He should wait at least another month

## 2014-04-26 NOTE — Telephone Encounter (Signed)
Patients wife aware of Dr.Carter's instructions. Patient to call ENT to reschedule appointment for they tried to contact her on several occassions.

## 2014-05-04 ENCOUNTER — Encounter: Payer: Self-pay | Admitting: *Deleted

## 2014-05-10 DIAGNOSIS — I951 Orthostatic hypotension: Secondary | ICD-10-CM | POA: Diagnosis not present

## 2014-05-10 DIAGNOSIS — K219 Gastro-esophageal reflux disease without esophagitis: Secondary | ICD-10-CM | POA: Diagnosis not present

## 2014-05-10 DIAGNOSIS — R0982 Postnasal drip: Secondary | ICD-10-CM | POA: Diagnosis not present

## 2014-05-13 ENCOUNTER — Ambulatory Visit (INDEPENDENT_AMBULATORY_CARE_PROVIDER_SITE_OTHER): Payer: Commercial Managed Care - HMO | Admitting: Internal Medicine

## 2014-05-13 ENCOUNTER — Encounter: Payer: Self-pay | Admitting: Internal Medicine

## 2014-05-13 VITALS — BP 128/60 | HR 76 | Ht 66.5 in | Wt 246.4 lb

## 2014-05-13 DIAGNOSIS — K638219 Small intestinal bacterial overgrowth, unspecified: Secondary | ICD-10-CM

## 2014-05-13 DIAGNOSIS — R142 Eructation: Secondary | ICD-10-CM

## 2014-05-13 DIAGNOSIS — R140 Abdominal distension (gaseous): Secondary | ICD-10-CM

## 2014-05-13 DIAGNOSIS — K2970 Gastritis, unspecified, without bleeding: Secondary | ICD-10-CM | POA: Diagnosis not present

## 2014-05-13 DIAGNOSIS — IMO0001 Reserved for inherently not codable concepts without codable children: Secondary | ICD-10-CM

## 2014-05-13 DIAGNOSIS — R143 Flatulence: Secondary | ICD-10-CM | POA: Diagnosis not present

## 2014-05-13 DIAGNOSIS — K6389 Other specified diseases of intestine: Secondary | ICD-10-CM

## 2014-05-13 DIAGNOSIS — K219 Gastro-esophageal reflux disease without esophagitis: Secondary | ICD-10-CM

## 2014-05-13 MED ORDER — METRONIDAZOLE 500 MG PO TABS: 500.0000 mg | ORAL_TABLET | Freq: Three times a day (TID) | ORAL | Status: DC

## 2014-05-13 MED ORDER — SULFAMETHOXAZOLE-TRIMETHOPRIM 800-160 MG PO TABS
1.0000 | ORAL_TABLET | Freq: Two times a day (BID) | ORAL | Status: DC
Start: 2014-05-13 — End: 2014-08-02

## 2014-05-13 MED ORDER — METOCLOPRAMIDE HCL 5 MG PO TABS: 5.0000 mg | ORAL_TABLET | Freq: Three times a day (TID) | ORAL | Status: DC

## 2014-05-13 NOTE — Progress Notes (Signed)
Subjective:    Patient ID: Omar Lambert, male    DOB: Nov 05, 1940, 74 y.o.   MRN: [id number]  HPI Omar Lambert is a 74 year old male with a past medical history of atrophic gastritis, GERD, abdominal bloating and belching who is seen in follow-up. He was seen in August 2015 by Alonza Bogus, PA-C and endoscopy was arranged. EGD was performed on a 2415. This revealed normal-appearing esophagus. Atrophic gastritis was found in the gastric antrum and body. Normal-appearing duodenum. Biopsies showed mild chronic active atrophic gastritis with intestinal metaplasia. No dysplasia or malignancy. There was no H. pylori seen but it was noted that it cannot be excluded given the intestinalized mucosa seen in the stomach. He was empirically treated with Prevpac which he completed.  He reports overall he is feeling well but he continues to have trouble with abdominal bloating and belching. Burn has not been much of an issue for him. He does have constipation as well for which he uses Epson salt. This seems to help. He reports a good appetite with no dysphagia or odynophagia. Denies early satiety. Denies weight loss. He reports he had colonoscopy around 2009 in [place] which he reports was normal. Was told to follow-up in 10 years. Does drink sodas but was told recently to stop. Abdominal bloating and belching along with frequent flatus is present usually most during the day and at times worse after eating. He also reports that he has issues with phlegm and was recently seen by ENT. They report being told by his ENT physician that this symptom was likely reflux related and that his exam of his upper airway and throat were "normal".   Review of Systems As per history of present illness, otherwise negative  Current Medications, Allergies, Past Medical History, Past Surgical History, Family History and Social History were reviewed in [employer] record.     Objective:   Physical  Exam BP 128/60 mmHg  Pulse 76  Ht 5' 6.5" (1.689 m)  Wt 246 lb 6 oz (111.755 kg)  BMI 39.17 kg/m2 Constitutional: Well-developed and well-nourished. No distress. HEENT: Normocephalic and atraumatic. Oropharynx is clear and moist. No oropharyngeal exudate. Conjunctivae are normal.  No scleral icterus. Neck: Neck supple. Trachea midline. Cardiovascular: Normal rate, regular rhythm and intact distal pulses. Pulmonary/chest: Effort normal and breath sounds normal. No wheezing, rales or rhonchi. Abdominal: Soft, obese, nontender, mildly distended. Bowel sounds active throughout.  No fluid wave Extremities: no clubbing, cyanosis, or edema Lymphadenopathy: No cervical adenopathy noted. Neurological: Alert and oriented to person place and time. Skin: Skin is warm and dry. No rashes noted. Psychiatric: Normal mood and affect. Behavior is normal.  CBC    Component Value Date/Time   WBC 4.5 01/28/2014 1131   WBC 3.7* 11/18/2012 1459   RBC 4.86 01/28/2014 1131   RBC 4.51 11/18/2012 1459   HGB 14.6 01/28/2014 1131   HCT 43.3 01/28/2014 1131   PLT 194 01/28/2014 1131   MCV 89 01/28/2014 1131   MCH 30.0 01/28/2014 1131   MCH 29.6 06/17/2012 0628   MCHC 33.7 01/28/2014 1131   MCHC 32.5 11/18/2012 1459   RDW 13.6 01/28/2014 1131   RDW 14.9* 11/18/2012 1459   LYMPHSABS 1.2 01/28/2014 1131   LYMPHSABS 1.2 11/18/2012 1459   MONOABS 0.6 11/18/2012 1459   EOSABS 0.2 01/28/2014 1131   EOSABS 0.2 11/18/2012 1459   BASOSABS 0.0 01/28/2014 1131   BASOSABS 0.0 11/18/2012 1459    CMP     Component  Value Date/Time   NA 144 01/28/2014 1131   NA 140 11/18/2012 1459   K 4.0 01/28/2014 1131   CL 98 01/28/2014 1131   CO2 31* 01/28/2014 1131   GLUCOSE 89 01/28/2014 1131   GLUCOSE 87 11/18/2012 1459   BUN 13 01/28/2014 1131   BUN 14 11/18/2012 1459   CREATININE 1.28* 01/28/2014 1131   CALCIUM 8.9 01/28/2014 1131   PROT 7.1 01/28/2014 1131   PROT 7.2 11/18/2012 1459   ALBUMIN 4.0 11/18/2012  1459   AST 26 01/28/2014 1131   ALT 19 01/28/2014 1131   ALKPHOS 50 01/28/2014 1131   BILITOT 0.8 01/28/2014 1131   GFRNONAA 55* 01/28/2014 1131   GFRAA 63 01/28/2014 1131    abd [place] 2014 - unremarkable except for small kidney cyst     Assessment & Plan:  74 year old male with a past medical history of atrophic gastritis, GERD, abdominal bloating and belching who is seen in follow-up.  1. Abdominal bloating/belching/flatus -- we discussed dietary modifications to decrease gas and bloating. I asked that he avoid carbonated beverages and sorbitol-containing sweeteners. Could be secondary to bacterial overgrowth will treat empirically with metronidazole 500 mg 3 times daily with Bactrim double strength twice a day 10 days. Trial of Reglan 5 mg 3 times daily before meals. Follow-up in 8-10 weeks.  2. Atrophic gastritis -- without dysplasia, and per the treated for H. pylori. Consider repeat endoscopy in August 2016 for additional biopsies to exclude dysplasia. Will discuss at follow-up  3. Constipation -- chronic without alarm symptom. Discontinue absence ALT, begin MiraLAX 17 g daily  4. CRC screening -- repeat colonoscopy recommended 2019 based on reportedly normal colonoscopy in 2009

## 2014-05-13 NOTE — Patient Instructions (Signed)
Discontinue Epsom salts  Please purchase the following medications over the counter and take as directed: Miralax 17 grams (1 capful) daily   We have sent the following medications to your pharmacy for you to pick up at your convenience: Flagyl 500 mg three times daily Bactrim DS twice daily Reglan 5 mg three times daily before meals  We have given you a gas/bloating diet to look over.  Please avoid carbonated beverages.  You will be due for a recall colonoscopy in 02/2017. We will send you a reminder in the mail when it gets closer to that time.  Please follow up with Dr Hilarie Fredrickson on 07/12/14 at 8:45 am.

## 2014-05-18 ENCOUNTER — Other Ambulatory Visit: Payer: Commercial Managed Care - HMO

## 2014-05-18 DIAGNOSIS — M1A9XX0 Chronic gout, unspecified, without tophus (tophi): Secondary | ICD-10-CM

## 2014-05-18 DIAGNOSIS — Z9109 Other allergy status, other than to drugs and biological substances: Secondary | ICD-10-CM

## 2014-05-18 DIAGNOSIS — J449 Chronic obstructive pulmonary disease, unspecified: Secondary | ICD-10-CM

## 2014-05-18 DIAGNOSIS — E119 Type 2 diabetes mellitus without complications: Secondary | ICD-10-CM

## 2014-05-18 DIAGNOSIS — Z91048 Other nonmedicinal substance allergy status: Secondary | ICD-10-CM | POA: Diagnosis not present

## 2014-05-19 LAB — CBC WITH DIFFERENTIAL
Basophils Absolute: 0 10*3/uL (ref 0.0–0.2)
Basos: 0 %
Eos: 12 %
Eosinophils Absolute: 0.5 10*3/uL — ABNORMAL HIGH (ref 0.0–0.4)
HCT: 46.8 % (ref 37.5–51.0)
Hemoglobin: 14.8 g/dL (ref 12.6–17.7)
Immature Grans (Abs): 0 10*3/uL (ref 0.0–0.1)
Immature Granulocytes: 0 %
Lymphocytes Absolute: 1.3 10*3/uL (ref 0.7–3.1)
Lymphs: 30 %
MCH: 29.5 pg (ref 26.6–33.0)
MCHC: 31.6 g/dL (ref 31.5–35.7)
MCV: 93 fL (ref 79–97)
Monocytes Absolute: 0.5 10*3/uL (ref 0.1–0.9)
Monocytes: 11 %
Neutrophils Absolute: 2 10*3/uL (ref 1.4–7.0)
Neutrophils Relative %: 47 %
RBC: 5.02 x10E6/uL (ref 4.14–5.80)
RDW: 14.4 % (ref 12.3–15.4)
WBC: 4.3 10*3/uL (ref 3.4–10.8)

## 2014-05-19 LAB — BASIC METABOLIC PANEL
BUN/Creatinine Ratio: 9 — ABNORMAL LOW (ref 10–22)
BUN: 15 mg/dL (ref 8–27)
CO2: 28 mmol/L (ref 18–29)
Calcium: 9.2 mg/dL (ref 8.6–10.2)
Chloride: 98 mmol/L (ref 97–108)
Creatinine, Ser: 1.72 mg/dL — ABNORMAL HIGH (ref 0.76–1.27)
GFR calc Af Amer: 44 mL/min/{1.73_m2} — ABNORMAL LOW (ref >59)
GFR calc non Af Amer: 38 mL/min/{1.73_m2} — ABNORMAL LOW (ref >59)
Glucose: 108 mg/dL — ABNORMAL HIGH (ref 65–99)
Potassium: 4.7 mmol/L (ref 3.5–5.2)
Sodium: 142 mmol/L (ref 134–144)

## 2014-05-19 LAB — HEMOGLOBIN A1C
Est. average glucose Bld gHb Est-mCnc: 131 mg/dL
Hgb A1c MFr Bld: 6.2 % — ABNORMAL HIGH (ref 4.8–5.6)

## 2014-05-19 LAB — LIPID PANEL
Chol/HDL Ratio: 2.9 ratio units (ref 0.0–5.0)
Cholesterol, Total: 191 mg/dL (ref 100–199)
HDL: 67 mg/dL (ref >39)
LDL Calculated: 113 mg/dL — ABNORMAL HIGH (ref 0–99)
Triglycerides: 53 mg/dL (ref 0–149)
VLDL Cholesterol Cal: 11 mg/dL (ref 5–40)

## 2014-05-19 LAB — URIC ACID: Uric Acid: 8.4 mg/dL (ref 3.7–8.6)

## 2014-05-20 ENCOUNTER — Ambulatory Visit (INDEPENDENT_AMBULATORY_CARE_PROVIDER_SITE_OTHER): Payer: Commercial Managed Care - HMO | Admitting: Internal Medicine

## 2014-05-20 ENCOUNTER — Encounter: Payer: Self-pay | Admitting: Internal Medicine

## 2014-05-20 VITALS — BP 120/78 | HR 62 | Temp 98.2°F | Resp 18 | Ht 67.0 in | Wt 247.0 lb

## 2014-05-20 DIAGNOSIS — N183 Chronic kidney disease, stage 3 (moderate): Secondary | ICD-10-CM | POA: Diagnosis not present

## 2014-05-20 DIAGNOSIS — J301 Allergic rhinitis due to pollen: Secondary | ICD-10-CM

## 2014-05-20 DIAGNOSIS — J449 Chronic obstructive pulmonary disease, unspecified: Secondary | ICD-10-CM

## 2014-05-20 DIAGNOSIS — N058 Unspecified nephritic syndrome with other morphologic changes: Secondary | ICD-10-CM | POA: Diagnosis not present

## 2014-05-20 DIAGNOSIS — K219 Gastro-esophageal reflux disease without esophagitis: Secondary | ICD-10-CM

## 2014-05-20 DIAGNOSIS — E1129 Type 2 diabetes mellitus with other diabetic kidney complication: Secondary | ICD-10-CM

## 2014-05-20 DIAGNOSIS — N1832 Chronic kidney disease, stage 3b: Secondary | ICD-10-CM

## 2014-05-20 NOTE — Progress Notes (Signed)
Patient ID: Omar Lambert, male   DOB: 05/15/1940, 74 y.o.   MRN: [id number]   Location:  [facility] / [place]  No Known Allergies  Chief Complaint  Patient presents with  . Medical Management of Chronic Issues    3 month follow-up, discuss labs (copy printed)   . Medication Management    Stopped demedax, please discuss     HPI: Patient is a 74 y.o. black male seen in the office today for med mgt of chronic diseases.    Taking a water pill that Dr. Claiborne Billings gave him--torsemide.   Dr. Hilarie Fredrickson has him on bactrim and flagyl for 2 week course.   CKD was worse when he came 4/12 and he had already started the bactrim so may have made renal function look worse due to binding activity it has. Notes improvement in his stomach.  On diet to decrease gas.   Has started exercising--walking, lifting weights at home.   No gout troubles.   Breathing has been pretty good also.  Wants to drop a few lbs--says 40-50.     Wants to work on his diet and exercise before seeing kidney specialist.    Review of Systems:  Review of Systems  Constitutional: Positive for malaise/fatigue. Negative for fever, chills and weight loss.  HENT: Negative for congestion.   Respiratory: Negative for shortness of breath and wheezing.   Cardiovascular: Negative for chest pain.  Gastrointestinal: Negative for heartburn, abdominal pain and constipation.  Genitourinary: Negative for dysuria.  Musculoskeletal: Negative for falls.  Neurological: Negative for dizziness.  Psychiatric/Behavioral: Negative for depression and memory loss.     Past Medical History  Diagnosis Date  . Benign essential hypertension   . Gout attack 09/2012  . Allergy   . Asthma   . Sinusitis, maxillary, chronic   . Type II or unspecified type diabetes mellitus without mention of complication, not stated as uncontrolled   . Atrophic gastritis   . COPD (chronic obstructive pulmonary disease)   . Diabetes   . GERD  (gastroesophageal reflux disease)     Past Surgical History  Procedure Laterality Date  . Hernia repair  1980  . Colonoscopy  2003    Social History:   reports that he quit smoking about 16 years ago. His smoking use included Cigarettes. He has a 20 pack-year smoking history. He has never used smokeless tobacco. He reports that he does not drink alcohol or use illicit drugs.  Family History  Problem Relation Age of Onset  . Stroke Mother   . Cancer Sister   . Diabetes Sister   . COPD Sister   . COPD Sister     Medications: Patient's Medications  New Prescriptions   No medications on file  Previous Medications   ALBUTEROL (PROVENTIL) (2.5 MG/3ML) 0.083% NEBULIZER SOLUTION    Take 3 mLs (2.5 mg total) by nebulization every 4 (four) hours as needed for wheezing. And asthma   ALLOPURINOL (ZYLOPRIM) 100 MG TABLET    Take 1 tablet (100 mg total) by mouth daily. For gout   ASPIRIN 81 MG TABLET    Take 81 mg by mouth daily.   LINAGLIPTIN (TRADJENTA) 5 MG TABS TABLET    Take 1 tablet (5 mg total) by mouth daily.   METOCLOPRAMIDE (REGLAN) 5 MG TABLET    Take 1 tablet (5 mg total) by mouth 3 (three) times daily before meals.   METRONIDAZOLE (FLAGYL) 500 MG TABLET    Take 1 tablet (  500 mg total) by mouth 3 (three) times daily.   MONTELUKAST (SINGULAIR) 10 MG TABLET    Take one tablet by mouth at bedtime as needed for allergies and asthma   MULTIPLE VITAMINS-MINERALS (CENTRUM SILVER ADULT 50+ PO)    Take 1 tablet by mouth daily.   PANTOPRAZOLE (PROTONIX) 40 MG TABLET    Take 1 tablet (40 mg total) by mouth daily.   SULFAMETHOXAZOLE-TRIMETHOPRIM (BACTRIM DS,SEPTRA DS) 800-160 MG PER TABLET    Take 1 tablet by mouth 2 (two) times daily.   TORSEMIDE (DEMADEX) 20 MG TABLET    Take 1 tablet twice daily for swelling (heart failure)  Modified Medications   No medications on file  Discontinued Medications   FLUTICASONE FUROATE-VILANTEROL (BREO ELLIPTA) 100-25 MCG/INH AEPB    Inhale 1 puff into the  lungs every morning. For asthma     Physical Exam: Filed Vitals:   05/20/14 1054  BP: 120/78  Pulse: 62  Temp: 98.2 F (36.8 C)  TempSrc: Oral  Resp: 18  Height: 5\' 7"  (1.702 m)  Weight: 247 lb (112.038 kg)  SpO2: 95%  Physical Exam  Constitutional: He is oriented to person, place, and time. He appears well-developed and well-nourished.  Obese black male  Cardiovascular: Normal rate, regular rhythm, normal heart sounds and intact distal pulses.   Pulmonary/Chest: Effort normal and breath sounds normal.  Abdominal: Soft. Bowel sounds are normal. He exhibits no distension and no mass. There is no tenderness.  Musculoskeletal: Normal range of motion. He exhibits no tenderness.  Neurological: He is alert and oriented to person, place, and time.   Diabetic Foot Exam - Simple   Simple Foot Form  Diabetic Foot exam was performed with the following findings:  Yes 05/20/2014 11:43 AM  Visual Inspection  See comments:  Yes  Sensation Testing  See comments:  Yes  Pulse Check  Posterior Tibialis and Dorsalis pulse intact bilaterally:  Yes  Comments  Left great toe with thickened callous and loss of sensation to light touch; bilateral feet with grafted skin from prior bunion surgery; has thickened great toenails; dry, scaly skin bilateral feet      Labs reviewed: Basic Metabolic Panel:  Recent Labs  10/15/13 0937 01/28/14 1131 05/18/14 0808  NA 141 144 142  K 4.0 4.0 4.7  CL 91* 98 98  CO2 31* 31* 28  GLUCOSE 111* 89 108*  BUN 34* 13 15  CREATININE 2.04* 1.28* 1.72*  CALCIUM 10.1 8.9 9.2   Liver Function Tests:  Recent Labs  06/09/13 0817 01/28/14 1131  AST 38 26  ALT 32 19  ALKPHOS 55 50  BILITOT 0.4 0.8  PROT 7.2 7.1   No results for input(s): LIPASE, AMYLASE in the last 8760 hours. No results for input(s): AMMONIA in the last 8760 hours. CBC:  Recent Labs  06/09/13 0817 01/28/14 1131 05/18/14 0808  WBC 5.4 4.5 4.3  NEUTROABS 3.4 2.7 2.0  HGB 12.8  14.6 14.8  HCT 40.7 43.3 46.8  MCV 92 89 93  PLT  --  194  --    Lipid Panel:  Recent Labs  10/13/13 0853 05/18/14 0808  CHOL 194 191  HDL 51 67  LDLCALC 117* 113*  TRIG 132 53  CHOLHDL 3.8 2.9   Lab Results  Component Value Date   HGBA1C 6.2* 05/18/2014    Assessment/Plan 1. Chronic kidney disease (CKD) stage G3b/A2, moderately decreased glomerular filtration rate (GFR) between 30-44 mL/min/1.73 square meter and albuminuria creatinine ratio between 30-299 mg/g -  has been progressively worsening, renal function worsened with ace in past -improve diabetic control  - CBC with Differential/Platelet; Future - Comprehensive metabolic panel; Future  2. Type 2 diabetes mellitus, controlled, with renal complications - has finally started some walking and weight lifting for exercise to lose weight -encouraged him to continue this  - Hemoglobin A1c; Future  3. Severe obesity (BMI >= 40) - unchanged, but has begun exercise and says he and his wife are following the same diet now - Hemoglobin A1c; Future - Lipid panel; Future  4. Gastroesophageal reflux disease without esophagitis -following with Dr. Hilarie Fredrickson for his atrophic gastritis  5. COPD, moderate -cont current inhaler regimen, diet and exercise for weight loss to help breathing  6. Allergic rhinitis due to pollen -was referred to ENT and finally had brought records here when he saw Dr. Eulas Post while I was away  Labs/tests ordered:   Orders Placed This Encounter  Procedures  . CBC with Differential/Platelet    Standing Status: Future     Number of Occurrences:      Standing Expiration Date: 11/19/2014  . Comprehensive metabolic panel    Standing Status: Future     Number of Occurrences:      Standing Expiration Date: 11/19/2014    Order Specific Question:  Has the patient fasted?    Answer:  Yes  . Hemoglobin A1c    Standing Status: Future     Number of Occurrences:      Standing Expiration Date: 11/19/2014  .  Lipid panel    Standing Status: Future     Number of Occurrences:      Standing Expiration Date: 11/19/2014    Order Specific Question:  Has the patient fasted?    Answer:  Yes    Next appt:  3 mos to f/u on ckd, dm  Andrina Locken L. Takeyla Million, D.O. [facility] [address], [place] [address] Cell Phone (Mon-Fri 8am-5pm):  [phone] On Call:  [phone] & follow prompts after 5pm & weekends Office Phone:  [phone] Office Fax:  [phone]

## 2014-05-25 ENCOUNTER — Other Ambulatory Visit: Payer: Self-pay | Admitting: Internal Medicine

## 2014-05-31 ENCOUNTER — Other Ambulatory Visit: Payer: Self-pay | Admitting: Internal Medicine

## 2014-07-12 ENCOUNTER — Encounter: Payer: Self-pay | Admitting: Internal Medicine

## 2014-07-12 ENCOUNTER — Encounter: Payer: Commercial Managed Care - HMO | Admitting: Internal Medicine

## 2014-07-12 ENCOUNTER — Ambulatory Visit: Payer: Commercial Managed Care - HMO | Admitting: Internal Medicine

## 2014-07-12 NOTE — Progress Notes (Signed)
Patient ID: Omar Lambert, male   DOB: September 15, 1940, 74 y.o.   MRN: [id number]  The patient's chart has been reviewed by Dr. Hilarie Fredrickson  and the recommendations are noted below.      Follow-up advised. Schedule patient for next available appointment. Outcome of communication with the patient:  Letter mailed

## 2014-08-02 ENCOUNTER — Ambulatory Visit: Payer: Commercial Managed Care - HMO | Admitting: Internal Medicine

## 2014-08-02 ENCOUNTER — Encounter: Payer: Self-pay | Admitting: Internal Medicine

## 2014-08-02 ENCOUNTER — Ambulatory Visit (INDEPENDENT_AMBULATORY_CARE_PROVIDER_SITE_OTHER): Payer: Commercial Managed Care - HMO | Admitting: Internal Medicine

## 2014-08-02 VITALS — BP 120/68 | HR 80 | Ht 66.5 in | Wt 249.0 lb

## 2014-08-02 DIAGNOSIS — K219 Gastro-esophageal reflux disease without esophagitis: Secondary | ICD-10-CM

## 2014-08-02 DIAGNOSIS — R142 Eructation: Secondary | ICD-10-CM | POA: Diagnosis not present

## 2014-08-02 DIAGNOSIS — K3189 Other diseases of stomach and duodenum: Secondary | ICD-10-CM | POA: Diagnosis not present

## 2014-08-02 DIAGNOSIS — K31A0 Gastric intestinal metaplasia, unspecified: Secondary | ICD-10-CM

## 2014-08-02 DIAGNOSIS — K2940 Chronic atrophic gastritis without bleeding: Secondary | ICD-10-CM

## 2014-08-02 NOTE — Progress Notes (Signed)
Subjective:    Patient ID: Omar Lambert, male    DOB: Dec 29, 1940, 74 y.o.   MRN: [id number]  HPI Omar Lambert is a 74 year old male with history of atrophic gastritis with metaplasia, GERD, abdominal bloating and belching who seen in follow-up. I saw him in April 2016. His here alone today. He reports he continues to feel well but continues to have belching and bloating. He is belching sour tasting fluid into his mouth. He denies abdominal pain. Reports normal appetite. Denies early satiety. No nausea or vomiting. No trouble swallowing. No change in bowel habits rectal bleeding or melena. Metoclopramide was prescribed 5 mg before meals and he doesn't feel that this is helped. He was treated with Flagyl for 1 week for possible bacterial overgrowth and didn't see much improvement. He does admit that he is not taking pantoprazole on a regular basis.  Of note after EGD 1 year ago showing atrophic gastritis with metaplasia he was empirically treated for H. pylori with Prevpac.   Review of Systems As per HPI, otherwise negative  Current Medications, Allergies, Past Medical History, Past Surgical History, Family History and Social History were reviewed in [employer] record.     Objective:   Physical Exam BP 120/68 mmHg  Pulse 80  Ht 5' 6.5" (1.689 m)  Wt 249 lb (112.946 kg)  BMI 39.59 kg/m2 Constitutional: Well-developed and well-nourished. No distress. HEENT: Normocephalic and atraumatic. Marland Kitchen Conjunctivae are normal.  No scleral icterus. Neck: Neck supple. Trachea midline. Cardiovascular: Normal rate, regular rhythm and intact distal pulses. No M/R/G Pulmonary/chest: Effort normal and breath sounds normal. No wheezing, rales or rhonchi. Abdominal: Soft, nontender, nondistended. Bowel sounds active throughout.  Extremities: no clubbing, cyanosis, or edema Neurological: Alert and oriented to person place and time. Psychiatric: Normal mood and affect. Behavior is  normal.  CBC    Component Value Date/Time   WBC 4.3 05/18/2014 0808   WBC 3.7* 11/18/2012 1459   RBC 5.02 05/18/2014 0808   RBC 4.51 11/18/2012 1459   HGB 14.8 05/18/2014 0808   HCT 46.8 05/18/2014 0808   PLT 194 01/28/2014 1131   MCV 93 05/18/2014 0808   MCH 29.5 05/18/2014 0808   MCH 29.6 06/17/2012 0628   MCHC 31.6 05/18/2014 0808   MCHC 32.5 11/18/2012 1459   RDW 14.4 05/18/2014 0808   RDW 14.9* 11/18/2012 1459   LYMPHSABS 1.3 05/18/2014 0808   LYMPHSABS 1.2 11/18/2012 1459   MONOABS 0.6 11/18/2012 1459   EOSABS 0.5* 05/18/2014 0808   EOSABS 0.2 11/18/2012 1459   BASOSABS 0.0 05/18/2014 0808   BASOSABS 0.0 11/18/2012 1459    CMP     Component Value Date/Time   NA 142 05/18/2014 0808   NA 140 11/18/2012 1459   K 4.7 05/18/2014 0808   CL 98 05/18/2014 0808   CO2 28 05/18/2014 0808   GLUCOSE 108* 05/18/2014 0808   GLUCOSE 87 11/18/2012 1459   BUN 15 05/18/2014 0808   BUN 14 11/18/2012 1459   CREATININE 1.72* 05/18/2014 0808   CALCIUM 9.2 05/18/2014 0808   PROT 7.1 01/28/2014 1131   PROT 7.2 11/18/2012 1459   ALBUMIN 4.0 11/18/2012 1459   AST 26 01/28/2014 1131   ALT 19 01/28/2014 1131   ALKPHOS 50 01/28/2014 1131   BILITOT 0.8 01/28/2014 1131   GFRNONAA 38* 05/18/2014 0808   GFRAA 44* 05/18/2014 0808   abd [place] 2014 -- unremarkable except small renal cyst     Assessment & Plan:  74 year old  male with history of atrophic gastritis with metaplasia, GERD, abdominal bloating and belching who seen in follow-up.  1. GERD/belching -- most likely secondary to uncontrolled reflux. He's been inconsistently taking PPI. We reviewed these medicines are best used on a consistent daily basis, 30 minutes before breakfast. I will have him resume pantoprazole 40 mg and try to take this on a daily basis. Given lack of response discontinue metoclopramide. He completed Flagyl and Bactrim without much improvement and bloating symptoms.  2. Atrophic gastritis -- no dysplasia.  Empirically treated for H. Pylori. Repeat endoscopy recommended in August for additional biopsies to exclude dysplasia given history of metaplasia in the setting of atrophic gastritis. We discussed the tests including the risks and benefits and he is agreeable to proceed  3. CRC screening -- repeat colonoscopy recommended 2019 based on reportedly normal colonoscopy in 2009

## 2014-08-02 NOTE — Patient Instructions (Signed)
Discontinue omeprazole, flagyl, bactrim, and reglan.   Start taking, Pantoprazole every day 30 minutes before breakfast.   You have been scheduled for an endoscopy. Please follow written instructions given to you at your visit today. If you use inhalers (even only as needed), please bring them with you on the day of your procedure. Your physician has requested that you go to www.startemmi.com and enter the access code given to you at your visit today. This web site gives a general overview about your procedure. However, you should still follow specific instructions given to you by our office regarding your preparation for the procedure.

## 2014-08-16 ENCOUNTER — Other Ambulatory Visit: Payer: Commercial Managed Care - HMO

## 2014-08-16 DIAGNOSIS — E1129 Type 2 diabetes mellitus with other diabetic kidney complication: Secondary | ICD-10-CM

## 2014-08-16 DIAGNOSIS — N183 Chronic kidney disease, stage 3 (moderate): Secondary | ICD-10-CM | POA: Diagnosis not present

## 2014-08-16 DIAGNOSIS — N1832 Chronic kidney disease, stage 3b: Secondary | ICD-10-CM

## 2014-08-16 DIAGNOSIS — N058 Unspecified nephritic syndrome with other morphologic changes: Secondary | ICD-10-CM | POA: Diagnosis not present

## 2014-08-17 LAB — COMPREHENSIVE METABOLIC PANEL
ALT: 29 IU/L (ref 0–44)
AST: 56 IU/L — ABNORMAL HIGH (ref 0–40)
Albumin/Globulin Ratio: 1.5 (ref 1.1–2.5)
Albumin: 4.3 g/dL (ref 3.5–4.8)
Alkaline Phosphatase: 55 IU/L (ref 39–117)
BUN/Creatinine Ratio: 15 (ref 10–22)
BUN: 22 mg/dL (ref 8–27)
Bilirubin Total: 0.8 mg/dL (ref 0.0–1.2)
CO2: 29 mmol/L (ref 18–29)
Calcium: 9.1 mg/dL (ref 8.6–10.2)
Chloride: 93 mmol/L — ABNORMAL LOW (ref 97–108)
Creatinine, Ser: 1.48 mg/dL — ABNORMAL HIGH (ref 0.76–1.27)
GFR calc Af Amer: 53 mL/min/{1.73_m2} — ABNORMAL LOW (ref >59)
GFR calc non Af Amer: 46 mL/min/{1.73_m2} — ABNORMAL LOW (ref >59)
Globulin, Total: 2.9 g/dL (ref 1.5–4.5)
Glucose: 118 mg/dL — ABNORMAL HIGH (ref 65–99)
Potassium: 3.2 mmol/L — ABNORMAL LOW (ref 3.5–5.2)
Sodium: 143 mmol/L (ref 134–144)
Total Protein: 7.2 g/dL (ref 6.0–8.5)

## 2014-08-17 LAB — CBC WITH DIFFERENTIAL/PLATELET
Basophils Absolute: 0 10*3/uL (ref 0.0–0.2)
Basos: 0 %
EOS (ABSOLUTE): 0.1 10*3/uL (ref 0.0–0.4)
Eos: 3 %
Hematocrit: 43.9 % (ref 37.5–51.0)
Hemoglobin: 14.6 g/dL (ref 12.6–17.7)
Immature Grans (Abs): 0 10*3/uL (ref 0.0–0.1)
Immature Granulocytes: 0 %
Lymphocytes Absolute: 0.9 10*3/uL (ref 0.7–3.1)
Lymphs: 25 %
MCH: 29.8 pg (ref 26.6–33.0)
MCHC: 33.3 g/dL (ref 31.5–35.7)
MCV: 90 fL (ref 79–97)
Monocytes Absolute: 0.4 10*3/uL (ref 0.1–0.9)
Monocytes: 10 %
Neutrophils Absolute: 2.4 10*3/uL (ref 1.4–7.0)
Neutrophils: 62 %
Platelets: 188 10*3/uL (ref 150–379)
RBC: 4.9 x10E6/uL (ref 4.14–5.80)
RDW: 14.2 % (ref 12.3–15.4)
WBC: 3.8 10*3/uL (ref 3.4–10.8)

## 2014-08-17 LAB — LIPID PANEL
Chol/HDL Ratio: 3.8 ratio units (ref 0.0–5.0)
Cholesterol, Total: 194 mg/dL (ref 100–199)
HDL: 51 mg/dL (ref >39)
LDL Calculated: 114 mg/dL — ABNORMAL HIGH (ref 0–99)
Triglycerides: 143 mg/dL (ref 0–149)
VLDL Cholesterol Cal: 29 mg/dL (ref 5–40)

## 2014-08-17 LAB — HEMOGLOBIN A1C
Est. average glucose Bld gHb Est-mCnc: 131 mg/dL
Hgb A1c MFr Bld: 6.2 % — ABNORMAL HIGH (ref 4.8–5.6)

## 2014-08-19 ENCOUNTER — Ambulatory Visit (INDEPENDENT_AMBULATORY_CARE_PROVIDER_SITE_OTHER): Payer: Commercial Managed Care - HMO | Admitting: Internal Medicine

## 2014-08-19 ENCOUNTER — Encounter: Payer: Self-pay | Admitting: Internal Medicine

## 2014-08-19 DIAGNOSIS — J449 Chronic obstructive pulmonary disease, unspecified: Secondary | ICD-10-CM

## 2014-08-19 DIAGNOSIS — I10 Essential (primary) hypertension: Secondary | ICD-10-CM

## 2014-08-19 DIAGNOSIS — Z9114 Patient's other noncompliance with medication regimen: Secondary | ICD-10-CM

## 2014-08-19 DIAGNOSIS — G4733 Obstructive sleep apnea (adult) (pediatric): Secondary | ICD-10-CM

## 2014-08-19 DIAGNOSIS — N1832 Chronic kidney disease, stage 3b: Secondary | ICD-10-CM | POA: Insufficient documentation

## 2014-08-19 DIAGNOSIS — N183 Chronic kidney disease, stage 3 (moderate): Secondary | ICD-10-CM

## 2014-08-19 DIAGNOSIS — K219 Gastro-esophageal reflux disease without esophagitis: Secondary | ICD-10-CM

## 2014-08-19 DIAGNOSIS — B078 Other viral warts: Secondary | ICD-10-CM | POA: Diagnosis not present

## 2014-08-19 DIAGNOSIS — N058 Unspecified nephritic syndrome with other morphologic changes: Secondary | ICD-10-CM | POA: Diagnosis not present

## 2014-08-19 DIAGNOSIS — Z9989 Dependence on other enabling machines and devices: Secondary | ICD-10-CM

## 2014-08-19 DIAGNOSIS — E1129 Type 2 diabetes mellitus with other diabetic kidney complication: Secondary | ICD-10-CM

## 2014-08-19 DIAGNOSIS — Z91148 Patient's other noncompliance with medication regimen for other reason: Secondary | ICD-10-CM

## 2014-08-19 NOTE — Progress Notes (Signed)
Patient ID: Omar Lambert, male   DOB: 1940/05/10, 74 y.o.   MRN: [id number]   Location:  [facility]  Code Status: full code Goals of Care: Advanced Directive information Does patient have an advance directive?: No, Would patient like information on creating an advanced directive?: Yes - Educational materials given (at prior visit but still has not done this.)   Chief Complaint  Patient presents with  . Medical Management of Chronic Issues    HPI: Patient is a 74 y.o. black male seen in the office today for medical mgt of chronic diseases.    Obesity:  Has lost 10 lbs since 6/27.  They started walking regularly.  Have not really done the gym thing but twice.  Sometimes gets achy all over, but the walking helps it actually.  Right knee will get stiff and sore if he doesn't walk for a long time.  Also bothers him if he sits a long time.    Hypokalemia:  Says he was not eating foods with potassium b/c many of them are the same ones that cause GERD.  Says even with the changes for the GERD, it didn't go away anyway.  Has an appt on 7/17 for EGD.    DMII:  Also has ophthalmology appt 8/2.  Tolerating tradjenta well. Cholesterol is still just a little above goal due to his diabetes.  Needs his f/u with Dr. Claiborne Billings also, but needs new referral for this.  No chest pain.    Needs a new strap for his CPAP.  Pulmonary ordered this at the beginning.  Asthma:  For some reason, he stopped taking his singulair, so he will undoubtedly run into some breathing problems soon.  Is not needing his albuterol.  Gout:  Has been having joint pains, but these sound like OA.  Apparently, he also stopped his allopurinol that he's supposed to be taking to prevent the flares.  Review of Systems:  Review of Systems  Constitutional: Negative for fever and chills.  HENT: Positive for congestion.   Eyes: Negative for blurred vision.       Glasses  Respiratory: Negative for  cough, sputum production, shortness of breath and wheezing.   Cardiovascular: Positive for leg swelling. Negative for chest pain and palpitations.  Gastrointestinal: Negative for heartburn, abdominal pain, constipation, blood in stool and melena.  Genitourinary: Negative for dysuria, urgency and frequency.  Musculoskeletal: Positive for joint pain. Negative for falls.  Skin: Negative for rash.  Neurological: Negative for dizziness, loss of consciousness and headaches.  Psychiatric/Behavioral: Negative for depression and memory loss. The patient is not nervous/anxious and does not have insomnia.     Past Medical History  Diagnosis Date  . Benign essential hypertension   . Gout attack 09/2012  . Allergy   . Asthma   . Sinusitis, maxillary, chronic   . Type II or unspecified type diabetes mellitus without mention of complication, not stated as uncontrolled   . Atrophic gastritis   . COPD (chronic obstructive pulmonary disease)   . Diabetes   . GERD (gastroesophageal reflux disease)     Past Surgical History  Procedure Laterality Date  . Hernia repair  1980  . Colonoscopy  2003    No Known Allergies Medications: Patient's Medications  New Prescriptions   No medications on file  Previous Medications   ALBUTEROL (PROVENTIL) (2.5 MG/3ML) 0.083% NEBULIZER SOLUTION    Take 3 mLs (2.5 mg total) by nebulization every 4 (four) hours  as needed for wheezing. And asthma   ALLOPURINOL (ZYLOPRIM) 100 MG TABLET    Take 1 tablet (100 mg total) by mouth daily. For gout   ASPIRIN 81 MG TABLET    Take 81 mg by mouth daily.   COLCHICINE 0.6 MG TABLET    TAKE 1 TABLET EVERY DAY   MONTELUKAST (SINGULAIR) 10 MG TABLET    Take one tablet by mouth at bedtime as needed for allergies and asthma   MULTIPLE VITAMINS-MINERALS (CENTRUM SILVER ADULT 50+ PO)    Take 1 tablet by mouth daily.   PANTOPRAZOLE (PROTONIX) 40 MG TABLET    Take 1 tablet (40 mg total) by mouth daily.   TORSEMIDE (DEMADEX) 20 MG TABLET     TAKE 1 TABLET BY MOUTH TWICE A DAY FOR SWELLING (HEART FAILURE)   TRADJENTA 5 MG TABS TABLET    TAKE 1 TABLET EVERY DAY  Modified Medications   No medications on file  Discontinued Medications   No medications on file    Physical Exam: Filed Vitals:   08/19/14 0741  BP: 136/80  Pulse: 70  Temp: 97.8 F (36.6 C)  TempSrc: Oral  Resp: 18  Height: 5\' 7"  (1.702 m)  Weight: 239 lb 9.6 oz (108.682 kg)  SpO2: 91%   Physical Exam  Constitutional: He is oriented to person, place, and time. He appears well-developed and well-nourished. No distress.  Obese black male  HENT:  Head: Normocephalic and atraumatic.  Eyes:  glasses  Cardiovascular: Normal rate, regular rhythm and intact distal pulses.   Murmur heard. Chronic venous insufficiency edema  Pulmonary/Chest: Effort normal and breath sounds normal. He has no wheezes.  Abdominal: Soft. Bowel sounds are normal. He exhibits no distension. There is no tenderness.  Musculoskeletal:  Ambulates w/o assistive device, has some crepitus in right knee; no erythema, warmth, effusion  Neurological: He is alert and oriented to person, place, and time.  Psychiatric: He has a normal mood and affect.    Labs reviewed: Basic Metabolic Panel:  Recent Labs  01/28/14 1131 05/18/14 0808 08/16/14 0853  NA 144 142 143  K 4.0 4.7 3.2*  CL 98 98 93*  CO2 31* 28 29  GLUCOSE 89 108* 118*  BUN 13 15 22   CREATININE 1.28* 1.72* 1.48*  CALCIUM 8.9 9.2 9.1   Liver Function Tests:  Recent Labs  01/28/14 1131 08/16/14 0853  AST 26 56*  ALT 19 29  ALKPHOS 50 55  BILITOT 0.8 0.8  PROT 7.1 7.2   No results for input(s): LIPASE, AMYLASE in the last 8760 hours. No results for input(s): AMMONIA in the last 8760 hours. CBC:  Recent Labs  01/28/14 1131 05/18/14 0808 08/16/14 0853  WBC 4.5 4.3 3.8  NEUTROABS 2.7 2.0 2.4  HGB 14.6 14.8  --   HCT 43.3 46.8 43.9  MCV 89 93  --   PLT 194  --   --    Lipid Panel:  Recent Labs   10/13/13 0853 05/18/14 0808 08/16/14 0853  CHOL 194 191 194  HDL 51 67 51  LDLCALC 117* 113* 114*  TRIG 132 53 143  CHOLHDL 3.8 2.9 3.8   Lab Results  Component Value Date   HGBA1C 6.2* 08/16/2014   Assessment/Plan 1. Severe obesity (BMI >= 40) - has lost weight with exercise, encouraged him on this to continue his walking routine - Lipid panel; Future - Basic metabolic panel; Future  2. Type 2 diabetes mellitus, controlled, with renal complications - cont tradjenta; metformin  was stopped due to progression of his renal failure - Lipid panel; Future - Hemoglobin A1c; Future  3. Chronic kidney disease (CKD) stage G3b/A2, moderately decreased glomerular filtration rate (GFR) between 30-44 mL/min/1.73 square meter and albuminuria creatinine ratio between 30-299 mg/g - encouraged hydration, avoid nsaids - Basic metabolic panel; Future  4. Gastroesophageal reflux disease without esophagitis -stable, cont PPI  5. Common wart -right lateral ankle has a couple of these--advised not to get these removed b/c he cannot get a sore on his diabetic feet b/c it won't heal  6. Essential hypertension, benign -bp is controlled on his current regimen -was initially referred to cardiology due to chest pains which he is not having at present; is on demadex for his CHF - Ambulatory referral to Cardiology  7. OSA on CPAP -sounds like he's not using his cpap again either b/c he needs a new strap--advised to contact the pulmonary office b/c they have ordered this equipment for him  8. Noncompliance with medication regimen -each time he comes, there's something he's not taking properly--does not want to be on so many meds and has been educated numerous times about how he must lose weight so some of his problems will improve so less meds are needed not just stop them b/c he feels better  9. COPD GOLD II -is not using his singulair as directed; fortunately, has not been needing nebs or albuterol  either (appears he does not have albuterol inhaler b/c it's not on the list for rescue)  Labs/tests ordered:   Orders Placed This Encounter  Procedures  . Lipid panel    Standing Status: Future     Number of Occurrences:      Standing Expiration Date: 02/19/2015    Order Specific Question:  Has the patient fasted?    Answer:  Yes  . Hemoglobin A1c    Standing Status: Future     Number of Occurrences:      Standing Expiration Date: 02/19/2015  . Basic metabolic panel    Standing Status: Future     Number of Occurrences:      Standing Expiration Date: 02/19/2015    Order Specific Question:  Has the patient fasted?    Answer:  Yes  . Ambulatory referral to Cardiology    Referral Priority:  Routine    Referral Type:  Consultation    Referral Reason:  Specialty Services Required    Requested Specialty:  Cardiology    Number of Visits Requested:  1    Next appt:  3 mos with labs before  [place] Gethsemane Fischler, D.O. [facility] [address], [place] [address] Cell Phone (Mon-Fri 8am-5pm):  [phone] On Call:  [phone] & follow prompts after 5pm & weekends Office Phone:  [phone] Office Fax:  [phone]

## 2014-08-23 ENCOUNTER — Telehealth: Payer: Self-pay

## 2014-08-23 MED ORDER — ALBUTEROL SULFATE HFA 108 (90 BASE) MCG/ACT IN AERS: INHALATION_SPRAY | RESPIRATORY_TRACT | Status: DC

## 2014-08-23 NOTE — Telephone Encounter (Signed)
Reviewed

## 2014-08-23 NOTE — Telephone Encounter (Signed)
-----   Message from Gayland Curry, DO sent at 08/22/2014  1:53 PM EDT ----- Please call Mr. Whatley and inform him that he was not advised to discontinue his allopurinol and singulair.  He needs to continue them to prevent gout and asthma/copd flare-ups respectively.  Also, I noted he does not have a rescue inhaler on his med list.  He should be on an albuterol inhaler to carry with him 2 puffs q 6 hrs prn wheezing.  Does he have one and it didn't make the list?

## 2014-08-23 NOTE — Telephone Encounter (Signed)
Mr Barb called back, faxed inhaler to [facility]

## 2014-08-23 NOTE — Telephone Encounter (Signed)
Left message for patient to call regarding his medications.

## 2014-08-24 ENCOUNTER — Other Ambulatory Visit: Payer: Self-pay | Admitting: Internal Medicine

## 2014-09-07 ENCOUNTER — Encounter: Payer: Self-pay | Admitting: Nurse Practitioner

## 2014-09-07 ENCOUNTER — Ambulatory Visit (INDEPENDENT_AMBULATORY_CARE_PROVIDER_SITE_OTHER): Payer: Commercial Managed Care - HMO | Admitting: Nurse Practitioner

## 2014-09-07 VITALS — BP 126/80 | HR 68 | Temp 97.7°F | Ht 67.0 in | Wt 245.0 lb

## 2014-09-07 DIAGNOSIS — H2513 Age-related nuclear cataract, bilateral: Secondary | ICD-10-CM | POA: Diagnosis not present

## 2014-09-07 DIAGNOSIS — E119 Type 2 diabetes mellitus without complications: Secondary | ICD-10-CM | POA: Diagnosis not present

## 2014-09-07 DIAGNOSIS — S90511A Abrasion, right ankle, initial encounter: Secondary | ICD-10-CM | POA: Diagnosis not present

## 2014-09-07 DIAGNOSIS — H40013 Open angle with borderline findings, low risk, bilateral: Secondary | ICD-10-CM | POA: Diagnosis not present

## 2014-09-07 LAB — HM DIABETES EYE EXAM

## 2014-09-07 MED ORDER — MUPIROCIN 2 % EX OINT: 1.0000 "application " | TOPICAL_OINTMENT | Freq: Two times a day (BID) | CUTANEOUS | Status: DC

## 2014-09-07 NOTE — Progress Notes (Signed)
Patient ID: Omar Lambert, male   DOB: [birth year], 74 y.o.   MRN: [id number]    PCP: [name], DO  No Known Allergies  Chief Complaint  Patient presents with  . Acute Visit    Right ankle concerns, patient injured x couple weeks. No bleeding or drainage present      HPI: Patient is a 74 y.o. male seen in the office today due to pain in right ankle. Had something stick into his ankle a few weeks ago and has not totally healed. Pt with hx of DM, venous insufficiency, CKD, HTN. Area was causing him a lot of pain, now without pain and more sore. Worse at night when he lays down. No fever, heat or drainage. Concerned due to hx of diabetes. Using neosporin daily   Review of Systems:  Review of Systems  Skin: Positive for wound (small wound to right medial ankle).  All other systems reviewed and are negative.   Past Medical History  Diagnosis Date  . Benign essential hypertension   . Gout attack 09/2012  . Allergy   . Asthma   . Sinusitis, maxillary, chronic   . Type II or unspecified type diabetes mellitus without mention of complication, not stated as uncontrolled   . Atrophic gastritis   . COPD (chronic obstructive pulmonary disease)   . Diabetes   . GERD (gastroesophageal reflux disease)    Past Surgical History  Procedure Laterality Date  . Hernia repair  1980  . Colonoscopy  2003   Social History:   reports that he quit smoking about 17 years ago. His smoking use included Cigarettes. He has a 20 pack-year smoking history. He has never used smokeless tobacco. He reports that he does not drink alcohol or use illicit drugs.  Family History  Problem Relation Age of Onset  . Stroke Mother   . Cancer Sister   . Diabetes Sister   . COPD Sister   . COPD Sister     Medications: Patient's Medications  New Prescriptions   No medications on file  Previous Medications   ALBUTEROL (PROVENTIL HFA;VENTOLIN HFA) 108 (90 BASE) MCG/ACT INHALER    Inhale 2 puffs every 6 hours  as needed for wheezing   ALBUTEROL (PROVENTIL) (2.5 MG/3ML) 0.083% NEBULIZER SOLUTION    Take 3 mLs (2.5 mg total) by nebulization every 4 (four) hours as needed for wheezing. And asthma   ALLOPURINOL (ZYLOPRIM) 100 MG TABLET    TAKE 1 TABLET EVERY DAY FOR GOUT   ASPIRIN 81 MG TABLET    Take 81 mg by mouth daily.   COLCHICINE 0.6 MG TABLET    TAKE 1 TABLET EVERY DAY   MONTELUKAST (SINGULAIR) 10 MG TABLET    Take one tablet by mouth at bedtime as needed for allergies and asthma   MULTIPLE VITAMINS-MINERALS (CENTRUM SILVER ADULT 50+ PO)    Take 1 tablet by mouth daily.   PANTOPRAZOLE (PROTONIX) 40 MG TABLET    Take 1 tablet (40 mg total) by mouth daily.   TORSEMIDE (DEMADEX) 20 MG TABLET    TAKE 1 TABLET BY MOUTH TWICE A DAY FOR SWELLING (HEART FAILURE)   TRADJENTA 5 MG TABS TABLET    TAKE 1 TABLET EVERY DAY  Modified Medications   No medications on file  Discontinued Medications   OMEPRAZOLE (PRILOSEC) 20 MG CAPSULE    TAKE 1 CAPSULE EVERY DAY     Physical Exam:  Filed Vitals:   09/07/14 1429  BP: 126/80  Pulse: 68  Temp: 97.7 F (36.5 C)  TempSrc: Oral  Height: 5\' 7"  (1.702 m)  Weight: 245 lb (111.131 kg)  SpO2: 93%    Physical Exam  Constitutional: He appears well-developed and well-nourished.  Cardiovascular: Normal rate, regular rhythm and normal heart sounds.   Pulmonary/Chest: Effort normal and breath sounds normal.  Skin: Skin is warm and dry.  Small abrasion noted to right medial ankle, no erythema or drainage  Psychiatric: He has a normal mood and affect.    Labs reviewed: Basic Metabolic Panel:  Recent Labs  01/28/14 1131 05/18/14 0808 08/16/14 0853  NA 144 142 143  K 4.0 4.7 3.2*  CL 98 98 93*  CO2 31* 28 29  GLUCOSE 89 108* 118*  BUN 13 15 22   CREATININE 1.28* 1.72* 1.48*  CALCIUM 8.9 9.2 9.1   Liver Function Tests:  Recent Labs  01/28/14 1131 08/16/14 0853  AST 26 56*  ALT 19 29  ALKPHOS 50 55  BILITOT 0.8 0.8  PROT 7.1 7.2   No results  for input(s): LIPASE, AMYLASE in the last 8760 hours. No results for input(s): AMMONIA in the last 8760 hours. CBC:  Recent Labs  01/28/14 1131 05/18/14 0808 08/16/14 0853  WBC 4.5 4.3 3.8  NEUTROABS 2.7 2.0 2.4  HGB 14.6 14.8  --   HCT 43.3 46.8 43.9  MCV 89 93  --   PLT 194  --   --    Lipid Panel:  Recent Labs  10/13/13 0853 05/18/14 0808 08/16/14 0853  CHOL 194 191 194  HDL 51 67 51  LDLCALC 117* 113* 114*  TRIG 132 53 143  CHOLHDL 3.8 2.9 3.8   TSH: No results for input(s): TSH in the last 8760 hours. A1C: Lab Results  Component Value Date   HGBA1C 6.2* 08/16/2014     Assessment/Plan 1. Abrasion of ankle, right, initial encounter -no active signs of infection. Will have pt clean and apply Bactroban until healed.  - mupirocin ointment (BACTROBAN) 2 %; Apply 1 application topically 2 (two) times daily.  Dispense: 22 g; Refill: 0 - notify worsening pain, drainage, heat, fever or area becomes larger.   [name]. Harle Battiest  [facility] [phone] 8 am - 5 pm) [phone] (after hours)

## 2014-09-07 NOTE — Patient Instructions (Signed)
To apply Bactroban ointment to area twice daily until healed Notify for fever, drainage, pain or heat

## 2014-09-10 ENCOUNTER — Encounter: Payer: Self-pay | Admitting: *Deleted

## 2014-09-21 ENCOUNTER — Encounter: Payer: Self-pay | Admitting: Internal Medicine

## 2014-09-21 ENCOUNTER — Ambulatory Visit (AMBULATORY_SURGERY_CENTER): Payer: Medicare HMO | Admitting: Internal Medicine

## 2014-09-21 VITALS — BP 122/80 | HR 77 | Temp 99.0°F | Resp 21 | Ht 66.0 in | Wt 249.0 lb

## 2014-09-21 DIAGNOSIS — K2940 Chronic atrophic gastritis without bleeding: Secondary | ICD-10-CM

## 2014-09-21 DIAGNOSIS — K31A0 Gastric intestinal metaplasia, unspecified: Secondary | ICD-10-CM

## 2014-09-21 DIAGNOSIS — G4733 Obstructive sleep apnea (adult) (pediatric): Secondary | ICD-10-CM | POA: Diagnosis not present

## 2014-09-21 DIAGNOSIS — K3189 Other diseases of stomach and duodenum: Secondary | ICD-10-CM

## 2014-09-21 DIAGNOSIS — K219 Gastro-esophageal reflux disease without esophagitis: Secondary | ICD-10-CM

## 2014-09-21 MED ORDER — SODIUM CHLORIDE 0.9 % IV SOLN: 500.0000 mL | INTRAVENOUS | Status: DC

## 2014-09-21 NOTE — Progress Notes (Signed)
No problems noted in the recovery room. maw 

## 2014-09-21 NOTE — Progress Notes (Signed)
A/ox3 pleased with MAC, report to Annette RN 

## 2014-09-21 NOTE — Op Note (Signed)
[place]  [address] [place], [id number]   ENDOSCOPY PROCEDURE REPORT  PATIENT: Edge, Katzman  MR#: [id number] BIRTHDATE: Feb 04, 1941 , 74  yrs. old GENDER: male ENDOSCOPIST: Jerene Bears, MD PROCEDURE DATE:  09/21/2014 PROCEDURE:  EGD, diagnostic and EGD w/ biopsy ASA CLASS:     Class III INDICATIONS:  Follow-up of atrophic gastritis with gastric metaplasia from EGD August 2015, empiric H.  pylori treatment after endoscopy in August 2015. MEDICATIONS: Monitored anesthesia care and Propofol 150 mg IV TOPICAL ANESTHETIC: none  DESCRIPTION OF PROCEDURE: After the risks benefits and alternatives of the procedure were thoroughly explained, informed consent was obtained.  The LB [id number] endoscope was introduced through the mouth and advanced to the second portion of the duodenum , Without limitations.  The instrument was slowly withdrawn as the mucosa was fully examined.   ESOPHAGUS: The mucosa of the esophagus appeared normal.  Z-line unremarkable at 42 cm.  STOMACH: Moderate to severe atrophic gastritis (inflammation) was found in the entire examined stomach. Mucosa characterized by flattening of normal gastric folds.  The mucosa was examined under white light and narrow-band imaging for targeted biopsies. Multiple biopsies were performed using cold forceps in the gastric antrum, including the prepyloric stomach (jar 1), gastric body (jar 2), and gastric cardia and fundus (jar 3).  Sample sent for histology to exclude dysplasia.  DUODENUM: The duodenal mucosa showed no abnormalities in the bulb and 2nd part of the duodenum.  Retroflexed views as previously described without other abnormality. The scope was then withdrawn from the patient and the procedure completed.  COMPLICATIONS: There were no immediate complications.   ENDOSCOPIC IMPRESSION: 1.   The mucosa of the esophagus appeared normal 2.   Atrophic gastritis (inflammation) was  found in the entire examined stomach; multiple biopsies were performed 3.   The duodenal mucosa showed no abnormalities in the bulb and 2nd part of the duodenum  RECOMMENDATIONS: Await biopsy results  eSigned:  Jerene Bears, MD 09/21/2014 11:14 AM    [id number] Mariea Clonts, DO and The Patient  PATIENT NAME:  [place], Gama MR#: [id number]

## 2014-09-21 NOTE — Patient Instructions (Addendum)
YOU HAD AN ENDOSCOPIC PROCEDURE TODAY AT [facility]:   Refer to the procedure report that was given to you for any specific questions about what was found during the examination.  If the procedure report does not answer your questions, please call your gastroenterologist to clarify.  If you requested that your care partner not be given the details of your procedure findings, then the procedure report has been included in a sealed envelope for you to review at your convenience later.  YOU SHOULD EXPECT: Some feelings of bloating in the abdomen. Passage of more gas than usual.  Walking can help get rid of the air that was put into your GI tract during the procedure and reduce the bloating. If you had a lower endoscopy (such as a colonoscopy or flexible sigmoidoscopy) you may notice spotting of blood in your stool or on the toilet paper. If you underwent a bowel prep for your procedure, you may not have a normal bowel movement for a few days.  Please Note:  You might notice some irritation and congestion in your nose or some drainage.  This is from the oxygen used during your procedure.  There is no need for concern and it should clear up in a day or so.  SYMPTOMS TO REPORT IMMEDIATELY:    Following upper endoscopy (EGD)  Vomiting of blood or coffee ground material  New chest pain or pain under the shoulder blades  Painful or persistently difficult swallowing  New shortness of breath  Fever of 100F or higher  Black, tarry-looking stools  For urgent or emergent issues, a gastroenterologist can be reached at any hour by calling [phone].   DIET: Your first meal following the procedure should be a small meal and then it is ok to progress to your normal diet. Heavy or fried foods are harder to digest and may make you feel nauseous or bloated.  Likewise, meals heavy in dairy and vegetables can increase bloating.  Drink plenty of fluids but you should avoid alcoholic beverages  for 24 hours.  ACTIVITY:  You should plan to take it easy for the rest of today and you should NOT DRIVE or use heavy machinery until tomorrow (because of the sedation medicines used during the test).    FOLLOW UP: Our staff will call the number listed on your records the next business day following your procedure to check on you and address any questions or concerns that you may have regarding the information given to you following your procedure. If we do not reach you, we will leave a message.  However, if you are feeling well and you are not experiencing any problems, there is no need to return our call.  We will assume that you have returned to your regular daily activities without incident.  If any biopsies were taken you will be contacted by phone or by letter within the next 1-3 weeks.  Please call us at [phone] if you have not heard about the biopsies in 3 weeks.    SIGNATURES/CONFIDENTIALITY: You and/or your care partner have signed paperwork which will be entered into your electronic medical record.  These signatures attest to the fact that that the information above on your After Visit Summary has been reviewed and is understood.  Full responsibility of the confidentiality of this discharge information lies with you and/or your care-partner.    Handout was given to your care partner on gastritis. You may resume your current medications today. Your  blood sugar was 100 in the recovery room. Await biopsy results. Please call if any questions or concerns.

## 2014-09-21 NOTE — Progress Notes (Signed)
Called to room to assist during endoscopic procedure.  Patient ID and intended procedure confirmed with present staff. Received instructions for my participation in the procedure from the performing physician.  

## 2014-09-22 ENCOUNTER — Telehealth: Payer: Self-pay | Admitting: *Deleted

## 2014-09-22 NOTE — Telephone Encounter (Signed)
  Follow up Call-  Call back number 09/21/2014 09/28/2013  Post procedure Call Back phone  # [phone]  Permission to leave phone message Yes Yes     Patient questions:  Do you have a fever, pain , or abdominal swelling? No. Pain Score  0 *  Have you tolerated food without any problems? Yes.    Have you been able to return to your normal activities? Yes.    Do you have any questions about your discharge instructions: Diet   No. Medications  No. Follow up visit  No.  Do you have questions or concerns about your Care? No.  Actions: * If pain score is 4 or above: No action needed, pain <4.

## 2014-09-25 ENCOUNTER — Other Ambulatory Visit: Payer: Self-pay | Admitting: Internal Medicine

## 2014-09-27 ENCOUNTER — Other Ambulatory Visit: Payer: Self-pay | Admitting: Nurse Practitioner

## 2014-09-28 ENCOUNTER — Encounter: Payer: Self-pay | Admitting: Internal Medicine

## 2014-10-02 ENCOUNTER — Other Ambulatory Visit: Payer: Self-pay | Admitting: Nurse Practitioner

## 2014-10-25 ENCOUNTER — Other Ambulatory Visit: Payer: Self-pay | Admitting: Internal Medicine

## 2014-10-27 ENCOUNTER — Telehealth: Payer: Self-pay | Admitting: *Deleted

## 2014-10-27 NOTE — Telephone Encounter (Signed)
Spoke with patient regarding his walk in request of getting a cheaper medication, we discussed what has been going on with his medication and he stated that it has increased in price to $ 151.00. I stated that he will need to inquire with the pharmacy to find out why his medication has increased and it may be an insurance issue that he may need to call and talk with them as well. He stated that he understood and would do that and get back with me.

## 2014-11-10 ENCOUNTER — Other Ambulatory Visit: Payer: Self-pay | Admitting: Gastroenterology

## 2014-11-10 ENCOUNTER — Ambulatory Visit (INDEPENDENT_AMBULATORY_CARE_PROVIDER_SITE_OTHER): Payer: Commercial Managed Care - HMO | Admitting: Cardiovascular Disease

## 2014-11-10 VITALS — BP 110/76 | HR 67 | Ht 68.0 in | Wt 240.3 lb

## 2014-11-10 DIAGNOSIS — Z79899 Other long term (current) drug therapy: Secondary | ICD-10-CM

## 2014-11-10 DIAGNOSIS — N183 Chronic kidney disease, stage 3 unspecified: Secondary | ICD-10-CM

## 2014-11-10 DIAGNOSIS — I10 Essential (primary) hypertension: Secondary | ICD-10-CM

## 2014-11-10 DIAGNOSIS — G4733 Obstructive sleep apnea (adult) (pediatric): Secondary | ICD-10-CM

## 2014-11-10 DIAGNOSIS — Z794 Long term (current) use of insulin: Secondary | ICD-10-CM

## 2014-11-10 DIAGNOSIS — I872 Venous insufficiency (chronic) (peripheral): Secondary | ICD-10-CM

## 2014-11-10 DIAGNOSIS — I483 Typical atrial flutter: Secondary | ICD-10-CM | POA: Diagnosis not present

## 2014-11-10 DIAGNOSIS — E1122 Type 2 diabetes mellitus with diabetic chronic kidney disease: Secondary | ICD-10-CM

## 2014-11-10 DIAGNOSIS — Z9989 Dependence on other enabling machines and devices: Secondary | ICD-10-CM

## 2014-11-10 MED ORDER — METOPROLOL SUCCINATE ER 25 MG PO TB24: ORAL_TABLET | ORAL | Status: DC

## 2014-11-10 MED ORDER — RIVAROXABAN 15 MG PO TABS: 15.0000 mg | ORAL_TABLET | Freq: Every day | ORAL | Status: DC

## 2014-11-10 NOTE — Patient Instructions (Addendum)
Your physician has requested that you have an echocardiogram. Echocardiography is a painless test that uses sound waves to create images of your heart. It provides your doctor with information about the size and shape of your heart and how well your heart's chambers and valves are working. This procedure takes approximately one hour. There are no restrictions for this procedure.  Your physician recommends that you return for lab work fasting before your next visit.  Your physician has recommended you make the following change in your medication:  1.) start xarelto 15 mg samples given . Take 1 tablet daily.  2.) start new metoprolol succ 25 mg prescription . Take 1/2 tablet daily until your next appointment.  Your physician recommends that you schedule a follow-up appointment in: 4 weeks with Dr. Claiborne Billings.  STOP HYDROXYCUTT diet supplement.

## 2014-11-12 ENCOUNTER — Encounter: Payer: Self-pay | Admitting: Cardiovascular Disease

## 2014-11-12 NOTE — Progress Notes (Signed)
Patient ID: Omar Lambert, male   DOB: 23-Aug-1940, 74 y.o.   MRN: [id number]     HPI:   Omar Lambert is a 74 year old African American male who has a history of severe COPD, IVs mellitus, chronic kidney disease, hypertension and bstructive sleep apnea, who presents for cardiology evaluation.  He was last seen in January 2015.  Omar Lambert has history of remote tobacco use and quit in 1999.  The returning from [place] from [place], he had seen Dr. Sharlett Iles in the past, who was concerned about potential hepatic congestion from cardiac status. He has seen  Patient admits to shortness of breath with minimal activity. He has a history of obstructive sleep apnea but admits to only intermittently using CPAP therapy.  When I saw him initially, he had significant lower extremity  edema and I was concerned about significant lower extremity venous insufficiency. A lower extremity venous Doppler study  did not show evidence for thrombus or thrombophlebitis but he had bilateral deep venous insufficiency within the femoral and popliteal veins. No venous insufficiency was noted in the right and left greater saphenous vein or small saphenous vein. A 2-D echo Doppler study on 12/17/2012 demonstrated normal systolic function with an ejection fraction of 55-60% as well as normal diastolic function. He had mild biatrial enlargement and mild dilatation of his right ventricle. There is mild pulmonary hypertension with a PA pressure 39 mm.  I have not seen him in almost 2 years.  He denies any episodes of chest pain.  He states he only uses his CPAP at most 30% of the time.  He needs new supplies.  He continues to note some shortness of breath with activity.  He states he recently had a bad cold.  He continues to note leg swelling.  He is unaware of any rhythm issues.  He has a history of significant obesity.  The last several weeks he has been using a diet supplement Hydroxycut, of which there is significant  amount of coffee extract in this preparation.  He presents for evaluation.  Past Medical History  Diagnosis Date  . Benign essential hypertension   . Gout attack 09/2012  . Allergy   . Asthma   . Sinusitis, maxillary, chronic   . Type II or unspecified type diabetes mellitus without mention of complication, not stated as uncontrolled   . Atrophic gastritis   . COPD (chronic obstructive pulmonary disease) ([place])   . Diabetes ([place])   . GERD (gastroesophageal reflux disease)     Past Surgical History  Procedure Laterality Date  . Hernia repair  1980  . Colonoscopy  2003    No Known Allergies  Current Outpatient Prescriptions  Medication Sig Dispense Refill  . albuterol (PROVENTIL HFA;VENTOLIN HFA) 108 (90 BASE) MCG/ACT inhaler Inhale 2 puffs every 6 hours as needed for wheezing 1 Inhaler 0  . albuterol (PROVENTIL) (2.5 MG/3ML) 0.083% nebulizer solution Take 3 mLs (2.5 mg total) by nebulization every 4 (four) hours as needed for wheezing. And asthma 30 vial 3  . allopurinol (ZYLOPRIM) 100 MG tablet TAKE 1 TABLET EVERY DAY FOR GOUT 30 tablet 5  . aspirin 81 MG tablet Take 81 mg by mouth daily.    . colchicine 0.6 MG tablet TAKE 1 TABLET BY MOUTH EVERY DAY 30 tablet 2  . montelukast (SINGULAIR) 10 MG tablet TAKE ONE TABLET BY MOUTH AT BEDTIME AS NEEDED FOR ALLERGIES AND ASTHMA 30 tablet 5  . Multiple Vitamins-Minerals (CENTRUM SILVER ADULT 50+ PO)  Take 1 tablet by mouth daily.    . mupirocin ointment (BACTROBAN) 2 % Apply 1 application topically 2 (two) times daily. 22 g 0  . pantoprazole (PROTONIX) 40 MG tablet TAKE 1 TABLET BY MOUTH EVERY DAY 90 tablet 1  . torsemide (DEMADEX) 20 MG tablet TAKE 1 TABLET BY MOUTH TWICE A DAY FOR SWELLING (HEART FAILURE) 60 tablet 5  . TRADJENTA 5 MG TABS tablet TAKE 1 TABLET EVERY DAY 30 tablet 5  . metoprolol succinate (TOPROL XL) 25 MG 24 hr tablet Take 1/2-1 tablet daily 30 tablet 6  . Rivaroxaban (XARELTO) 15 MG TABS tablet Take 1 tablet (15 mg  total) by mouth daily. 56 tablet 0   No current facility-administered medications for this visit.    Social history is notable in that he is married for 55 years. He has 3 children 8 grandchildren 6 great-grandchildren. He is retired from [employer]. He completed ninth grade education. He smoked until 1999.  Family History  Problem Relation Age of Onset  . Stroke Mother   . Cancer Sister   . Diabetes Sister   . COPD Sister   . COPD Sister     ROS General: Negative; No fevers, chills, or night sweats; no 6S with weight loss HEENT: Negative; No changes in vision or hearing, sinus congestion, difficulty swallowing Pulmonary: Positive for severe COPD.  Positive for shortness of breath. Cardiovascular: Negative; No chest pain, presyncope, syncope, palpitations Positive for leg swelling GI: Negative; No nausea, vomiting, diarrhea, or abdominal pain GU: Negative; No dysuria, hematuria, or difficulty voiding Musculoskeletal: Negative; no myalgias, joint pain, or weakness Rheumatologic: Positive for gout  Hematologic/Oncology: Negative; no easy bruising, bleeding Endocrine: Negative; no heat/cold intolerance; no diabetes Neuro: Negative; no changes in balance, headaches Skin: Negative; No rashes or skin lesions Psychiatric: Negative; No behavioral problems, depression Sleep: Negative; No snoring, daytime sleepiness, hypersomnolence, bruxism, restless legs, hypnogognic hallucinations, no cataplexy Other comprehensive 14 point system review is negative.    PE BP 110/76 mmHg  Pulse 67  Ht 5' 8" (1.727 m)  Wt 240 lb 4.8 oz (108.999 kg)  BMI 36.55 kg/m2   Wt Readings from Last 3 Encounters:  11/10/14 240 lb 4.8 oz (108.999 kg)  09/21/14 249 lb (112.946 kg)  09/07/14 245 lb (111.131 kg)   General: Alert, oriented, no distress.  Skin: normal turgor, no rashes HEENT: Normocephalic, atraumatic. Pupils round and reactive; sclera anicteric; Fundi mild arteriolar narrowing. No  hemorrhages or exudates. Nose without nasal septal hypertrophy Mouth/Parynx benign; Mallinpatti scale 4 Neck: Thick neck No JVD, no carotid bruits Lungs: clear to ausculatation and percussion; no wheezing or rales Heart: Mildly irregular rhythm; s1 s2 normal a 1/6 systolic murmur. No S3 or S4 gallop. No rubs, thrills or heaves. Abdomen: Diastases recti; soft, nontender; no hepatosplenomehaly, BS+; abdominal aorta nontender and not dilated by palpation. Pulses 2+ Extremities: Tense trace to 1+ bilateral lower lower extremity edema. no clubbinbg cyanosis, Homan's sign negative  Neurologic: grossly nonfocal; radial nerves grossly intact  Psychologic: Normal mood  ECG (independently read by me): Atrial flutter with variable block.  Left axis deviation.  Nonspecific T changes.  QTc interval 458 ms.   January 2015 ECG (independently read by me): Sinus rhythm with frequent atrial premature conk contractions as well as occasional unifocal premature ventricular contractions. Nonspecific ST and T wave changes.  Prior ECG of 11/25/2012: Sinus rhythm with first-degree AV block with occasional PVC. The patient is entirely asymptomatic with reference to this ectopy.  LABS: BMP Latest Ref Rng 08/16/2014 05/18/2014 01/28/2014  Glucose 65 - 99 mg/dL 118(H) 108(H) 89  BUN 8 - 27 mg/dL _0 Creatinine 0.76 - 1.27 mg/dL 1.48(H) 1.72(H) 1.28(H)  BUN/Creat Ratio 10 - 22 15 9(L) 10  Sodium 134 - 144 mmol/L 143 142 144  Potassium 3.5 - 5.2 mmol/L 3.2(L) 4.7 4.0  Chloride 97 - 108 mmol/L 93(L) 98 98  CO2 18 - 29 mmol/L 29 28 31(H)  Calcium 8.6 - 10.2 mg/dL 9.1 9.2 8.9   Hepatic Function Latest Ref Rng 08/16/2014 01/28/2014 06/09/2013  Total Protein 6.0 - 8.5 g/dL 7.2 7.1 7.2  Albumin 3.5 - 4.8 g/dL 4.3 4.3 4.4  AST 0 - 40 IU/L 56(H) 26 38  ALT 0 - 44 IU/L 29 19 32  Alk Phosphatase 39 - 117 IU/L 55 50 55  Total Bilirubin 0.0 - 1.2 mg/dL 0.8 0.8 0.4  Bilirubin, Direct 0.0 - 0.3 mg/dL - - -   CBC Latest  Ref Rng 08/16/2014 05/18/2014 01/28/2014  WBC 3.4 - 10.8 x10E3/uL 3.8 4.3 4.5  Hemoglobin 12.6 - 17.7 g/dL - 14.8 14.6  Hematocrit 37.5 - 51.0 % 43.9 46.8 43.3  Platelets 150 - 379 x10E3/uL - - 194   Lab Results  Component Value Date   MCV 93 05/18/2014   MCV 89 01/28/2014   MCV 92 06/09/2013   Lab Results  Component Value Date   TSH 2.05 11/18/2012   Lab Results  Component Value Date   HGBA1C 6.2* 08/16/2014   Lipid Panel     Component Value Date/Time   CHOL 194 08/16/2014 0853   TRIG 143 08/16/2014 0853   HDL 51 08/16/2014 0853   CHOLHDL 3.8 08/16/2014 0853   LDLCALC 114* 08/16/2014 0853      RADIOLOGY: US Abdomen Complete  11/25/2012   CLINICAL DATA:  Abdominal distention  EXAM: ULTRASOUND ABDOMEN COMPLETE  COMPARISON:  None.  FINDINGS: Gallbladder  No gallstones or wall thickening visualized. No sonographic Murphy sign noted.  Common bile duct  Diameter: 4.2 mm  Liver  No focal lesion identified. Within normal limits in parenchymal echogenicity.  IVC  No abnormality visualized.  Pancreas  Visualized portion unremarkable.  Spleen  Size and appearance within normal limits.  Right Kidney  Length: 9.8 cm in length. A 1.8 cm cyst is noted in the parapelvic region.  Left Kidney  Length: 10.7 cm in length. Echogenicity within normal limits. No mass or hydronephrosis visualized.  Abdominal aorta  No aneurysm visualized.  IMPRESSION: Right renal cyst. No other focal abnormality is noted.   Electronically Signed   By: Inez Catalina M.D.   On: 11/25/2012 08:12     ASSESSMENT AND PLAN: Omar Lambert is a 74 year old African American male with a long history of prior tobacco use but quit approximately 17 years ago. He has COPD. A prior echo Doppler study demonstrated normal systolic and diastolic function with mild biatrial enlargement as well as mild dilatation of his right ventricle and mild pulmonary hypertension with an estimated PA pressure 39 mm. his ECG today reveals that he is  in atrial flutter with variable block.  He was unaware of this atrial arrhythmia.  He has not been utilizing CPAP with consistency and only uses this about 30% of the time.  I discussed the importance of obtaining new supplies.  His blood pressure today is stable.  He has tense lower extremity edema and has been taking torsemide 20 mg twice a day.  He  is diabetic on Tradjenta.  He also has GERD on Protonix.  The past several weeks he has been taking Hydroxycut supplement 2 times a day for 2 weeks.  This has a significant ingredient of caffeine which may be responsible for his atrial flutter.  Presently, I am recommending a complete set of laboratory be checked.  With his atrial arrhythmia, and previous history of renal insufficiency with a creatinine in April 2016 at1.72 and a creatinine of 1.48 in July 2016.  I will start him on Xarelto initially at 15 mg initially for anticoagulation.  I discussed the risk, benefits of anticoagulation therapy and potential thromboembolic stroke risk reduction.  Dose adjustment will be made if his most recent renal function has improved and his creatinine clearance is in excess of 50.  I am scheduling him for an echo Doppler study.  I am adding metoprolol succinate initially at 12.5 mg since his resting pulse is already in the 60s.  I discussed with him the importance of discontinuing his supplement which contains caffeine and other extracts.  With his atrial arrhythmia.  I also discussed the importance of improved CPAP compliance.  I will see him in 4 weeks for follow-up evaluation and further recommendation will be made at that time.  Time spent: 40 minutes  Troy Sine, MD, [facility] 11/12/2014 7:16 PM

## 2014-11-18 ENCOUNTER — Ambulatory Visit (HOSPITAL_COMMUNITY): Payer: Commercial Managed Care - HMO

## 2014-11-19 ENCOUNTER — Other Ambulatory Visit: Payer: Commercial Managed Care - HMO

## 2014-11-19 DIAGNOSIS — E1129 Type 2 diabetes mellitus with other diabetic kidney complication: Secondary | ICD-10-CM | POA: Diagnosis not present

## 2014-11-19 DIAGNOSIS — N1832 Chronic kidney disease, stage 3b: Secondary | ICD-10-CM

## 2014-11-19 DIAGNOSIS — N183 Chronic kidney disease, stage 3 (moderate): Secondary | ICD-10-CM

## 2014-11-20 LAB — BASIC METABOLIC PANEL
BUN/Creatinine Ratio: 16 (ref 10–22)
BUN: 25 mg/dL (ref 8–27)
CO2: 30 mmol/L — ABNORMAL HIGH (ref 18–29)
Calcium: 9.7 mg/dL (ref 8.6–10.2)
Chloride: 97 mmol/L (ref 97–108)
Creatinine, Ser: 1.53 mg/dL — ABNORMAL HIGH (ref 0.76–1.27)
GFR calc Af Amer: 51 mL/min/{1.73_m2} — ABNORMAL LOW (ref >59)
GFR calc non Af Amer: 44 mL/min/{1.73_m2} — ABNORMAL LOW (ref >59)
Glucose: 97 mg/dL (ref 65–99)
Potassium: 4.3 mmol/L (ref 3.5–5.2)
Sodium: 142 mmol/L (ref 134–144)

## 2014-11-20 LAB — LIPID PANEL
Chol/HDL Ratio: 3.6 ratio units (ref 0.0–5.0)
Cholesterol, Total: 189 mg/dL (ref 100–199)
HDL: 53 mg/dL (ref >39)
LDL Calculated: 124 mg/dL — ABNORMAL HIGH (ref 0–99)
Triglycerides: 61 mg/dL (ref 0–149)
VLDL Cholesterol Cal: 12 mg/dL (ref 5–40)

## 2014-11-20 LAB — HEMOGLOBIN A1C
Est. average glucose Bld gHb Est-mCnc: 128 mg/dL
Hgb A1c MFr Bld: 6.1 % — ABNORMAL HIGH (ref 4.8–5.6)

## 2014-11-22 ENCOUNTER — Encounter: Payer: Self-pay | Admitting: Internal Medicine

## 2014-11-22 ENCOUNTER — Ambulatory Visit (INDEPENDENT_AMBULATORY_CARE_PROVIDER_SITE_OTHER): Payer: Commercial Managed Care - HMO | Admitting: Internal Medicine

## 2014-11-22 VITALS — BP 132/70 | HR 71 | Temp 97.9°F | Resp 20 | Ht 68.0 in | Wt 250.8 lb

## 2014-11-22 DIAGNOSIS — N183 Chronic kidney disease, stage 3 unspecified: Secondary | ICD-10-CM

## 2014-11-22 DIAGNOSIS — Z23 Encounter for immunization: Secondary | ICD-10-CM | POA: Diagnosis not present

## 2014-11-22 DIAGNOSIS — I483 Typical atrial flutter: Secondary | ICD-10-CM

## 2014-11-22 DIAGNOSIS — G4733 Obstructive sleep apnea (adult) (pediatric): Secondary | ICD-10-CM | POA: Diagnosis not present

## 2014-11-22 DIAGNOSIS — E1169 Type 2 diabetes mellitus with other specified complication: Secondary | ICD-10-CM | POA: Diagnosis not present

## 2014-11-22 DIAGNOSIS — N1832 Chronic kidney disease, stage 3b: Secondary | ICD-10-CM

## 2014-11-22 DIAGNOSIS — M1A0790 Idiopathic chronic gout, unspecified ankle and foot, without tophus (tophi): Secondary | ICD-10-CM | POA: Diagnosis not present

## 2014-11-22 DIAGNOSIS — Z9989 Dependence on other enabling machines and devices: Secondary | ICD-10-CM

## 2014-11-22 DIAGNOSIS — E1122 Type 2 diabetes mellitus with diabetic chronic kidney disease: Secondary | ICD-10-CM | POA: Diagnosis not present

## 2014-11-22 DIAGNOSIS — E785 Hyperlipidemia, unspecified: Secondary | ICD-10-CM

## 2014-11-22 MED ORDER — PRAVASTATIN SODIUM 20 MG PO TABS: 20.0000 mg | ORAL_TABLET | Freq: Every day | ORAL | Status: DC

## 2014-11-22 MED ORDER — COLCHICINE 0.6 MG PO TABS: 0.6000 mg | ORAL_TABLET | Freq: Every day | ORAL | Status: DC | PRN

## 2014-11-22 NOTE — Progress Notes (Signed)
Patient ID: Omar Lambert, male   DOB: 05-24-1940, 74 y.o.   MRN: [id number]   Location: [place] Provider: Rexene Edison. Mariea Clonts, D.O., C.M.D.  Code Status: full code Goals of Care: Advanced Directive information Does patient have an advance directive?: Yes  Chief Complaint  Patient presents with  . Medical Management of Chronic Issues    3 month follow-up for Hypertension    HPI: Patient is a 74 y.o. male seen in the office today for medical mgt of his chronic diseases.    Has been to Dr. Claiborne Billings 10/5.  Has testing scheduled tomorrow.  Was diagnosed with typical atrial flutter.  Stopped the hydroxycut supplement per Dr. Evette Georges appropriate instructions.  Was started on xarelto and metoprolol succinate 12.5mg  po daily for rate control and bp.    Lost weight and gained again.  Has been walking and lifting weights some.  Says he needs to eat better.  Trying to make better choices.  LDL has been trending upward.   Says colchicine is expensive.  Discussed that he's been on it for 6 mos now so it can be used just as needed now.  Continue to use allopurinol forever.    He notes he probably overdoes rice and sweets.  Asks about getting diabetic shoes.  Continues on tradjenta without problems.    Says he needs some of his CPAP supplies but is using the machine.  Note from cardiology indicates only 30% of the time.  He is unsure which company it was, but says that Dr. Evette Georges nurse is looking into it.  Importance of following up emphasized.  He c/o phlegm in his throat.  He continues on singulair.  He wants something to help with the mucus.  Edema has improved with demadex regularly and trying to decrease sodium.  BP was initially up but improved to 132/70 on recheck.     Review of Systems:  Review of Systems  Constitutional: Negative for fever, chills, weight loss and malaise/fatigue.  HENT: Negative for congestion.   Eyes: Negative for blurred vision.  Respiratory: Positive for  sputum production and shortness of breath. Negative for cough and wheezing.   Cardiovascular: Positive for leg swelling. Negative for chest pain and palpitations.       Improved  Gastrointestinal: Negative for abdominal pain, constipation, blood in stool and melena.  Genitourinary: Negative for dysuria.  Musculoskeletal: Negative for falls.  Skin: Negative for rash.  Neurological: Negative for dizziness and weakness.  Psychiatric/Behavioral: Negative for depression and memory loss.    Past Medical History  Diagnosis Date  . Benign essential hypertension   . Gout attack 09/2012  . Allergy   . Asthma   . Sinusitis, maxillary, chronic   . Type II or unspecified type diabetes mellitus without mention of complication, not stated as uncontrolled   . Atrophic gastritis   . COPD (chronic obstructive pulmonary disease) ([place])   . Diabetes ([name])   . GERD (gastroesophageal reflux disease)     Past Surgical History  Procedure Laterality Date  . Hernia repair  1980  . Colonoscopy  2003    No Known Allergies    Medication List       This list is accurate as of: 11/22/14  8:41 AM.  Always use your most recent med list.               albuterol (2.5 MG/3ML) 0.083% nebulizer solution  Commonly known as:  PROVENTIL  Take 3 mLs (2.5 mg total) by  nebulization every 4 (four) hours as needed for wheezing. And asthma     albuterol 108 (90 BASE) MCG/ACT inhaler  Commonly known as:  PROVENTIL HFA;VENTOLIN HFA  Inhale 2 puffs every 6 hours as needed for wheezing     allopurinol 100 MG tablet  Commonly known as:  ZYLOPRIM  TAKE 1 TABLET EVERY DAY FOR GOUT     aspirin 81 MG tablet  Take 81 mg by mouth daily.     CENTRUM SILVER ADULT 50+ PO  Take 1 tablet by mouth daily.     colchicine 0.6 MG tablet  TAKE 1 TABLET BY MOUTH EVERY DAY     metoprolol succinate 25 MG 24 hr tablet  Commonly known as:  TOPROL XL  Take 1/2-1 tablet daily     montelukast 10 MG tablet  Commonly known as:   SINGULAIR  TAKE ONE TABLET BY MOUTH AT BEDTIME AS NEEDED FOR ALLERGIES AND ASTHMA     mupirocin ointment 2 %  Commonly known as:  BACTROBAN  Apply 1 application topically 2 (two) times daily.     pantoprazole 40 MG tablet  Commonly known as:  PROTONIX  TAKE 1 TABLET BY MOUTH EVERY DAY     Rivaroxaban 15 MG Tabs tablet  Commonly known as:  XARELTO  Take 1 tablet (15 mg total) by mouth daily.     torsemide 20 MG tablet  Commonly known as:  DEMADEX  TAKE 1 TABLET BY MOUTH TWICE A DAY FOR SWELLING (HEART FAILURE)     TRADJENTA 5 MG Tabs tablet  Generic drug:  linagliptin  TAKE 1 TABLET EVERY DAY        Health Maintenance  Topic Date Due  . URINE MICROALBUMIN  06/09/2014  . ZOSTAVAX  02/06/2015 (Originally 05/28/1999)  . INFLUENZA VACCINE  10/17/2015 (Originally 09/06/2014)  . FOOT EXAM  05/20/2015  . HEMOGLOBIN A1C  05/20/2015  . OPHTHALMOLOGY EXAM  09/07/2015  . COLONOSCOPY  02/05/2021  . TETANUS/TDAP  02/05/2021  . PNA vac Low Risk Adult  Completed    Physical Exam: Filed Vitals:   11/22/14 0823  BP: 140/98  Pulse: 71  Temp: 97.9 F (36.6 C)  TempSrc: Oral  Resp: 20  Height: 5\' 8"  (1.727 m)  Weight: 250 lb 12.8 oz (113.762 kg)  SpO2: 94%   Body mass index is 38.14 kg/(m^2). Physical Exam  Constitutional: He appears well-developed and well-nourished. No distress.  HENT:  Head: Normocephalic and atraumatic.  Gold tooth  Eyes:  glasses  Cardiovascular: Intact distal pulses.   irreg irreg; no edema today  Pulmonary/Chest: Effort normal and breath sounds normal. He has no wheezes. He has no rales.  Abdominal: Soft. Bowel sounds are normal. He exhibits no distension. There is no tenderness.  Musculoskeletal: Normal range of motion.  Neurological: He is alert.  Skin: Skin is warm and dry.  Psychiatric: He has a normal mood and affect.    Labs reviewed: Basic Metabolic Panel:  Recent Labs  05/18/14 0808 08/16/14 0853 11/19/14 0828  NA 142 143 142  K  4.7 3.2* 4.3  CL 98 93* 97  CO2 28 29 30*  GLUCOSE 108* 118* 97  BUN 15 22 25   CREATININE 1.72* 1.48* 1.53*  CALCIUM 9.2 9.1 9.7   Liver Function Tests:  Recent Labs  01/28/14 1131 08/16/14 0853  AST 26 56*  ALT 19 29  ALKPHOS 50 55  BILITOT 0.8 0.8  PROT 7.1 7.2  ALBUMIN 4.3 4.3   No results for input(s):  LIPASE, AMYLASE in the last 8760 hours. No results for input(s): AMMONIA in the last 8760 hours. CBC:  Recent Labs  01/28/14 1131 05/18/14 0808 08/16/14 0853  WBC 4.5 4.3 3.8  NEUTROABS 2.7 2.0 2.4  HGB 14.6 14.8  --   HCT 43.3 46.8 43.9  MCV 89 93  --   PLT 194  --   --    Lipid Panel:  Recent Labs  05/18/14 0808 08/16/14 0853 11/19/14 0828  CHOL 191 194 189  HDL 67 51 53  LDLCALC 113* 114* 124*  TRIG 53 143 61  CHOLHDL 2.9 3.8 3.6   Lab Results  Component Value Date   HGBA1C 6.1* 11/19/2014    Procedures since last visit: Reviewed note from Dr. Claiborne Billings  Assessment/Plan 1. Idiopathic chronic gout of foot without tophus, unspecified laterality - colchicine 0.6 MG tablet; Take 1 tablet (0.6 mg total) by mouth daily as needed (gout flare-up).  Dispense: 30 tablet; Refill: 2--changed instructions to as needed as he's been on it for more than six months -cont allopurinol indefinitely - Comprehensive metabolic panel; Future  2. Typical atrial flutter ([place]) - f/u with Dr. Claiborne Billings as planned - cont xarelto and metoprolol succinate as ordered  3. Controlled type 2 diabetes mellitus with stage 3 chronic kidney disease, without long-term current use of insulin (HCC) -has been stable, avoid nsaids and other nephrotoxic agents - Hemoglobin A1c; Future -cont tradjenta  4. Hyperlipidemia associated with type 2 diabetes mellitus ([place]) -lipids have continued to trend up and he's not been able to do this with diet -- pravastatin (PRAVACHOL) 20 MG tablet; Take 1 tablet (20 mg total) by mouth daily with supper.  Dispense: 30 tablet; Refill: 3 - Lipid panel;  Future  5. Morbid obesity due to excess calories ([place]) - cont to work on diet and exercise, counseled more today - CBC with Differential/Platelet; Future - Hemoglobin A1c; Future  6. Chronic kidney disease (CKD) stage G3b/A2, moderately decreased glomerular filtration rate (GFR) between 30-44 mL/min/1.73 square meter and albuminuria creatinine ratio between 30-299 mg/g - stable, no changes  7. OSA on CPAP -emphasized importance of regular use and following up about necessary supplies  8. Need for prophylactic vaccination and inoculation against influenza -flu shot given  Labs/tests ordered:   Orders Placed This Encounter  Procedures  . CBC with Differential/Platelet    Standing Status: Future     Number of Occurrences:      Standing Expiration Date: 05/23/2015  . Comprehensive metabolic panel    Standing Status: Future     Number of Occurrences:      Standing Expiration Date: 05/23/2015    Order Specific Question:  Has the patient fasted?    Answer:  Yes  . Hemoglobin A1c    Standing Status: Future     Number of Occurrences:      Standing Expiration Date: 05/23/2015  . Lipid panel    Standing Status: Future     Number of Occurrences:      Standing Expiration Date: 05/23/2015    Order Specific Question:  Has the patient fasted?    Answer:  Yes   Next appt:  3 mos for annual exam with fasting labs before  Yurani Fettes L. Shirlee Whitmire, D.O. [facility] [address], [place] [address] Cell Phone (Mon-Fri 8am-5pm):  [phone] On Call:  [phone] & follow prompts after 5pm & weekends Office Phone:  [phone] Office Fax:  [phone]

## 2014-11-22 NOTE — Addendum Note (Signed)
Addended by: Elijah Birk L on: 11/22/2014 10:00 AM   Modules accepted: Orders

## 2014-11-22 NOTE — Patient Instructions (Addendum)
Go to [employer] for diabetic shoes.    You may take plain mucinex (NOT D or DM) for the congestion in your throat.  This is over the counter.    Start the new cholesterol pill at supper.

## 2014-11-23 ENCOUNTER — Ambulatory Visit (HOSPITAL_COMMUNITY): Payer: Commercial Managed Care - HMO | Attending: Internal Medicine

## 2014-11-23 ENCOUNTER — Other Ambulatory Visit: Payer: Self-pay

## 2014-11-23 DIAGNOSIS — I517 Cardiomegaly: Secondary | ICD-10-CM | POA: Diagnosis not present

## 2014-11-23 DIAGNOSIS — I483 Typical atrial flutter: Secondary | ICD-10-CM | POA: Insufficient documentation

## 2014-11-23 DIAGNOSIS — I071 Rheumatic tricuspid insufficiency: Secondary | ICD-10-CM | POA: Insufficient documentation

## 2014-11-23 DIAGNOSIS — I340 Nonrheumatic mitral (valve) insufficiency: Secondary | ICD-10-CM | POA: Insufficient documentation

## 2014-12-13 DIAGNOSIS — I483 Typical atrial flutter: Secondary | ICD-10-CM | POA: Diagnosis not present

## 2014-12-13 DIAGNOSIS — I10 Essential (primary) hypertension: Secondary | ICD-10-CM | POA: Diagnosis not present

## 2014-12-13 DIAGNOSIS — Z79899 Other long term (current) drug therapy: Secondary | ICD-10-CM | POA: Diagnosis not present

## 2014-12-13 DIAGNOSIS — I4892 Unspecified atrial flutter: Secondary | ICD-10-CM | POA: Diagnosis not present

## 2014-12-13 LAB — CBC
HCT: 41.3 % (ref 39.0–52.0)
Hemoglobin: 13.5 g/dL (ref 13.0–17.0)
MCH: 29.5 pg (ref 26.0–34.0)
MCHC: 32.7 g/dL (ref 30.0–36.0)
MCV: 90.2 fL (ref 78.0–100.0)
MPV: 10.8 fL (ref 8.6–12.4)
PLATELETS: 236 10*3/uL (ref 150–400)
RBC: 4.58 MIL/uL (ref 4.22–5.81)
RDW: 14.1 % (ref 11.5–15.5)
WBC: 4.3 10*3/uL (ref 4.0–10.5)

## 2014-12-14 LAB — LIPID PANEL
Cholesterol: 174 mg/dL (ref 125–200)
HDL: 55 mg/dL (ref >=40)
LDL Cholesterol: 106 mg/dL (ref <130)
Total CHOL/HDL Ratio: 3.2 Ratio (ref <=5.0)
Triglycerides: 63 mg/dL (ref <150)
VLDL: 13 mg/dL (ref <30)

## 2014-12-14 LAB — COMPREHENSIVE METABOLIC PANEL
ALT: 16 U/L (ref 9–46)
AST: 31 U/L (ref 10–35)
Albumin: 3.8 g/dL (ref 3.6–5.1)
Alkaline Phosphatase: 42 U/L (ref 40–115)
BUN: 24 mg/dL (ref 7–25)
CO2: 31 mmol/L (ref 20–31)
Calcium: 9 mg/dL (ref 8.6–10.3)
Chloride: 98 mmol/L (ref 98–110)
Creat: 1.36 mg/dL — ABNORMAL HIGH (ref 0.70–1.18)
Glucose, Bld: 84 mg/dL (ref 65–99)
Potassium: 4.2 mmol/L (ref 3.5–5.3)
Sodium: 142 mmol/L (ref 135–146)
Total Bilirubin: 0.5 mg/dL (ref 0.2–1.2)
Total Protein: 7.3 g/dL (ref 6.1–8.1)

## 2014-12-14 LAB — MAGNESIUM: Magnesium: 1.8 mg/dL (ref 1.5–2.5)

## 2014-12-14 LAB — TSH: TSH: 3.535 u[IU]/mL (ref 0.350–4.500)

## 2014-12-16 ENCOUNTER — Ambulatory Visit (INDEPENDENT_AMBULATORY_CARE_PROVIDER_SITE_OTHER): Payer: Commercial Managed Care - HMO | Admitting: Cardiovascular Disease

## 2014-12-16 ENCOUNTER — Encounter: Payer: Self-pay | Admitting: Cardiovascular Disease

## 2014-12-16 VITALS — BP 122/84 | HR 80 | Ht 68.0 in | Wt 245.0 lb

## 2014-12-16 DIAGNOSIS — I481 Persistent atrial fibrillation: Secondary | ICD-10-CM

## 2014-12-16 DIAGNOSIS — I483 Typical atrial flutter: Secondary | ICD-10-CM | POA: Diagnosis not present

## 2014-12-16 DIAGNOSIS — Z9989 Dependence on other enabling machines and devices: Secondary | ICD-10-CM

## 2014-12-16 DIAGNOSIS — Z7901 Long term (current) use of anticoagulants: Secondary | ICD-10-CM

## 2014-12-16 DIAGNOSIS — Z5181 Encounter for therapeutic drug level monitoring: Secondary | ICD-10-CM

## 2014-12-16 DIAGNOSIS — I872 Venous insufficiency (chronic) (peripheral): Secondary | ICD-10-CM

## 2014-12-16 DIAGNOSIS — I4819 Other persistent atrial fibrillation: Secondary | ICD-10-CM

## 2014-12-16 DIAGNOSIS — E669 Obesity, unspecified: Secondary | ICD-10-CM

## 2014-12-16 DIAGNOSIS — G4733 Obstructive sleep apnea (adult) (pediatric): Secondary | ICD-10-CM | POA: Diagnosis not present

## 2014-12-16 MED ORDER — AMIODARONE HCL 200 MG PO TABS: 200.0000 mg | ORAL_TABLET | Freq: Every day | ORAL | Status: DC

## 2014-12-16 MED ORDER — APIXABAN 5 MG PO TABS: 5.0000 mg | ORAL_TABLET | Freq: Two times a day (BID) | ORAL | Status: DC

## 2014-12-16 NOTE — Patient Instructions (Signed)
Your physician has recommended you make the following change in your medication:   1.) STOP the aspirin and Xarelto. The Xarelto has been replaced with Eliquis. This has been sent to your pharmacy.   2.) start new prescription for amiodarone 200 mg. This also has been sent to your pharmacy.  Your physician recommends that you schedule a follow-up appointment in: 3-4 weeks.

## 2014-12-18 ENCOUNTER — Encounter: Payer: Self-pay | Admitting: Cardiovascular Disease

## 2014-12-18 DIAGNOSIS — I4819 Other persistent atrial fibrillation: Secondary | ICD-10-CM | POA: Insufficient documentation

## 2014-12-18 DIAGNOSIS — E669 Obesity, unspecified: Secondary | ICD-10-CM | POA: Insufficient documentation

## 2014-12-18 DIAGNOSIS — Z5181 Encounter for therapeutic drug level monitoring: Secondary | ICD-10-CM | POA: Insufficient documentation

## 2014-12-18 DIAGNOSIS — Z7901 Long term (current) use of anticoagulants: Secondary | ICD-10-CM

## 2014-12-18 NOTE — Progress Notes (Signed)
Patient ID: Omar Lambert, male   DOB: 1940-05-09, 74 y.o.   MRN: [id number]     HPI:   [name] is a 74 year old African American male who has a history of severe COPD, diabetes mellitus, chronic kidney disease, hypertension, obstructive sleep apnea and recently diagnosed atrial flutter.  He presents for one-month follow-up evaluation.    When I saw him initially, he had significant lower extremity  edema and I was concerned about significant lower extremity venous insufficiency. A lower extremity venous Doppler study  did not show evidence for thrombus or thrombophlebitis but he had bilateral deep venous insufficiency within the femoral and popliteal veins. No venous insufficiency was noted in the right and left greater saphenous vein or small saphenous vein. A 2-D echo Doppler study on 12/17/2012 demonstrated normal systolic function with an ejection fraction of 55-60% as well as normal diastolic function. He had mild biatrial enlargement and mild dilatation of his right ventricle. There is mild pulmonary hypertension with a PA pressure 39 mm.  Prior to seeing him one month ago, I have not seen him in over 2 years.  He denies any episodes of chest pain.  At  that time, he was only using his CPAP at most 30% of the time.  He needed new supplies.  He admitted to experiencing some shortness of breath with activity.  He states he recently had a bad cold.  He continues to note leg swelling.  He is unaware of any rhythm issues.  He has a history of significant obesity.  The last several weeks he has been using a diet supplement Hydroxycut, of which there is significant amount of coffee extract in this preparation.   When I saw him, his ECG suggested atrial flutter with variable block.  At that time, I advised he discontinue his over-the-counter supplement.  With his elevated chads 2 score, I recommend initiation of anticoagulation.  Since he had had renal insufficiency with creatinine in April 2016.   A 1.72 and creatinine in July at 1.48.  I initially started him on Xarelto 15 mg for anticoagulation.  He underwent an echo Doppler study which revealed an EF of 60-65%.  His left atrium was only at the upper limits of normal in size.  However, his right atrium was severely dilated.  There was mild tricuspid regurgitation.  I also initiated metoprolol, initially at 12.5 mg since his resting pulse was already in the 60s.  I also discussed the importance of improved CPAP compliance.  Over the past month, he now admits to 100% compliance with his CPAP.  He did a follow-up blood work which showed stable hemoglobin and hematocrit.  His current renal function continued to improve and was now 1.36.  Lipid studies revealed a total cholesterol 174, triglycerides 63, HDL 55, and LDL 106.  He had a normal magnesium.  TSH was 3.53.  He states he has not taken Xarelto for at least 10 days when he began to notice some flecks of blood in stool.  He denied any overt GI bleeding.  He presents for evaluation.  Past Medical History  Diagnosis Date  . Benign essential hypertension   . Gout attack 09/2012  . Allergy   . Asthma   . Sinusitis, maxillary, chronic   . Type II or unspecified type diabetes mellitus without mention of complication, not stated as uncontrolled   . Atrophic gastritis   . COPD (chronic obstructive pulmonary disease) ([place])   . Diabetes ([place])   .  GERD (gastroesophageal reflux disease)     Past Surgical History  Procedure Laterality Date  . Hernia repair  1980  . Colonoscopy  2003    No Known Allergies  Current Outpatient Prescriptions  Medication Sig Dispense Refill  . albuterol (PROVENTIL HFA;VENTOLIN HFA) 108 (90 BASE) MCG/ACT inhaler Inhale 2 puffs every 6 hours as needed for wheezing 1 Inhaler 0  . albuterol (PROVENTIL) (2.5 MG/3ML) 0.083% nebulizer solution Take 3 mLs (2.5 mg total) by nebulization every 4 (four) hours as needed for wheezing. And asthma 30 vial 3  . allopurinol  (ZYLOPRIM) 100 MG tablet TAKE 1 TABLET EVERY DAY FOR GOUT 30 tablet 5  . colchicine 0.6 MG tablet Take 1 tablet (0.6 mg total) by mouth daily as needed (gout flare-up). 30 tablet 2  . metoprolol succinate (TOPROL XL) 25 MG 24 hr tablet Take 1/2-1 tablet daily 30 tablet 6  . montelukast (SINGULAIR) 10 MG tablet TAKE ONE TABLET BY MOUTH AT BEDTIME AS NEEDED FOR ALLERGIES AND ASTHMA 30 tablet 5  . Multiple Vitamins-Minerals (CENTRUM SILVER ADULT 50+ PO) Take 1 tablet by mouth daily.    . mupirocin ointment (BACTROBAN) 2 % Apply 1 application topically 2 (two) times daily. 22 g 0  . pantoprazole (PROTONIX) 40 MG tablet TAKE 1 TABLET BY MOUTH EVERY DAY 90 tablet 1  . pravastatin (PRAVACHOL) 20 MG tablet Take 1 tablet (20 mg total) by mouth daily with supper. 30 tablet 3  . torsemide (DEMADEX) 20 MG tablet TAKE 1 TABLET BY MOUTH TWICE A DAY FOR SWELLING (HEART FAILURE) 60 tablet 5  . TRADJENTA 5 MG TABS tablet TAKE 1 TABLET EVERY DAY 30 tablet 5  . amiodarone (PACERONE) 200 MG tablet Take 1 tablet (200 mg total) by mouth daily. 30 tablet 6  . apixaban (ELIQUIS) 5 MG TABS tablet Take 1 tablet (5 mg total) by mouth 2 (two) times daily. 60 tablet 6   No current facility-administered medications for this visit.    Social history is notable in that he is married for 55 years. He has 3 children 8 grandchildren 6 great-grandchildren. He is retired from [employer]. He completed ninth grade education. He smoked until 1999.  Family History  Problem Relation Age of Onset  . Stroke Mother   . Cancer Sister   . Diabetes Sister   . COPD Sister   . COPD Sister     ROS General: Negative; No fevers, chills, or night sweats; no 6S with weight loss HEENT: Negative; No changes in vision or hearing, sinus congestion, difficulty swallowing Pulmonary: Positive for severe COPD.  Positive for shortness of breath. Cardiovascular: Negative; No chest pain, presyncope, syncope, palpitations Positive for leg  swelling GI: Negative; No nausea, vomiting, diarrhea, or abdominal pain GU: Negative; No dysuria, hematuria, or difficulty voiding Musculoskeletal: Negative; no myalgias, joint pain, or weakness Rheumatologic: Positive for gout  Hematologic/Oncology: Negative; no easy bruising, bleeding Endocrine: Positive for diabetes mellitus Neuro: Negative; no changes in balance, headaches Skin: Negative; No rashes or skin lesions Psychiatric: Negative; No behavioral problems, depression Sleep: Positive for obstructive sleep apnea; now with improved compliance.  No snoring, daytime sleepiness, hypersomnolence, bruxism, restless legs, hypnogognic hallucinations, no cataplexy Other comprehensive 14 point system review is negative.   PE BP 122/84 mmHg  Pulse 80  Ht '5\' 8"'  (1.727 m)  Wt 245 lb (111.131 kg)  BMI 37.26 kg/m2    Wt Readings from Last 3 Encounters:  12/16/14 245 lb (111.131 kg)  11/22/14 250 lb 12.8  oz (113.762 kg)  11/10/14 240 lb 4.8 oz (108.999 kg)   General: Alert, oriented, no distress.  Skin: normal turgor, no rashes HEENT: Normocephalic, atraumatic. Pupils round and reactive; sclera anicteric; Fundi mild arteriolar narrowing. No hemorrhages or exudates. Nose without nasal septal hypertrophy Mouth/Parynx benign; Mallinpatti scale 4 Neck: Thick neck No JVD, no carotid bruits Lungs: clear to ausculatation and percussion; no wheezing or rales Heart: Mildly irregular rhythm; s1 s2 normal a 1/6 systolic murmur. No S3 or S4 gallop. No rubs, thrills or heaves. Abdomen: Diastases recti; soft, nontender; no hepatosplenomehaly, BS+; abdominal aorta nontender and not dilated by palpation. Pulses 2+ Extremities: Previous tense lower extremity edema has significantly improved and is now trace.  lower extremity edema. no clubbinbg cyanosis, Homan's sign negative  Neurologic: grossly nonfocal; radial nerves grossly intact  Psychologic: Normal mood  ECG (independently read by me): Atrial  fibrillation at 80 bpm with 1 PVC.  11/10/2014 ECG (independently read by me): Atrial flutter with variable block.  Left axis deviation.  Nonspecific T changes.  QTc interval 458 ms.   January 2015 ECG (independently read by me): Sinus rhythm with frequent atrial premature conk contractions as well as occasional unifocal premature ventricular contractions. Nonspecific ST and T wave changes.  Prior ECG of 11/25/2012: Sinus rhythm with first-degree AV block with occasional PVC. The patient is entirely asymptomatic with reference to this ectopy.  LABS: BMP Latest Ref Rng 12/13/2014 11/19/2014 08/16/2014  Glucose 65 - 99 mg/dL 84 97 118(H)  BUN 7 - 25 mg/dL '24 25 22  ' Creatinine 0.70 - 1.18 mg/dL 1.36(H) 1.53(H) 1.48(H)  BUN/Creat Ratio 10 - 22 - 16 15  Sodium 135 - 146 mmol/L 142 142 143  Potassium 3.5 - 5.3 mmol/L 4.2 4.3 3.2(L)  Chloride 98 - 110 mmol/L 98 97 93(L)  CO2 20 - 31 mmol/L 31 30(H) 29  Calcium 8.6 - 10.3 mg/dL 9.0 9.7 9.1   Hepatic Function Latest Ref Rng 12/13/2014 08/16/2014 01/28/2014  Total Protein 6.1 - 8.1 g/dL 7.3 7.2 7.1  Albumin 3.6 - 5.1 g/dL 3.8 4.3 4.3  AST 10 - 35 U/L 31 56(H) 26  ALT 9 - 46 U/L '16 29 19  ' Alk Phosphatase 40 - 115 U/L 42 55 50  Total Bilirubin 0.2 - 1.2 mg/dL 0.5 0.8 0.8  Bilirubin, Direct 0.0 - 0.3 mg/dL - - -   CBC Latest Ref Rng 12/13/2014 08/16/2014 05/18/2014  WBC 4.0 - 10.5 K/uL 4.3 3.8 4.3  Hemoglobin 13.0 - 17.0 g/dL 13.5 - 14.8  Hematocrit 39.0 - 52.0 % 41.3 43.9 46.8  Platelets 150 - 400 K/uL 236 - -   Lab Results  Component Value Date   MCV 90.2 12/13/2014   MCV 93 05/18/2014   MCV 89 01/28/2014   Lab Results  Component Value Date   TSH 3.535 12/13/2014   Lab Results  Component Value Date   HGBA1C 6.1* 11/19/2014   Lipid Panel     Component Value Date/Time   CHOL 174 12/13/2014 0856   CHOL 189 11/19/2014 0828   TRIG 63 12/13/2014 0856   HDL 55 12/13/2014 0856   HDL 53 11/19/2014 0828   CHOLHDL 3.2 12/13/2014 0856    CHOLHDL 3.6 11/19/2014 0828   VLDL 13 12/13/2014 0856   LDLCALC 106 12/13/2014 0856   LDLCALC 124* 11/19/2014 0828      RADIOLOGY: US Abdomen Complete  11/25/2012   CLINICAL DATA:  Abdominal distention  EXAM: ULTRASOUND ABDOMEN COMPLETE  COMPARISON:  None.  FINDINGS:  Gallbladder  No gallstones or wall thickening visualized. No sonographic Murphy sign noted.  Common bile duct  Diameter: 4.2 mm  Liver  No focal lesion identified. Within normal limits in parenchymal echogenicity.  IVC  No abnormality visualized.  Pancreas  Visualized portion unremarkable.  Spleen  Size and appearance within normal limits.  Right Kidney  Length: 9.8 cm in length. A 1.8 cm cyst is noted in the parapelvic region.  Left Kidney  Length: 10.7 cm in length. Echogenicity within normal limits. No mass or hydronephrosis visualized.  Abdominal aorta  No aneurysm visualized.  IMPRESSION: Right renal cyst. No other focal abnormality is noted.   Electronically Signed   By: Inez Catalina M.D.   On: 11/25/2012 08:12     ASSESSMENT AND PLAN: [name] is a 74 year old African American male with a long history of prior tobacco use but quit approximately 17 years ago. He has COPD. A prior echo Doppler study demonstrated normal systolic and diastolic function with mild biatrial enlargement as well as mild dilatation of his right ventricle and mild pulmonary hypertension with an estimated PA pressure 39 mm.  His most recent echo shows ejection fraction at 60-65% with moderate left ventricular hypertrophy,  trivial MR, upper normal LA size but severely dilated, RA with mild tricuspid regurgitation.  There is mild pulmonary hypertension with a PA pressure at 38 mm.  When I saw him one month ago after not having seen him in over 2 years, he was in atrial flutter with variable block.  He was using an over-the-counter supplement which had a significant amount of caffeine.  His ECG today shows atrial fibrillation with a ventricular rate in  the 80s on his low-dose Toprol therapy.  He has not been very compliant with Xarelto and had used it only for approximately 2-1/2 weeks prior to noting trace flecks of blood when he wiped himself.  His renal function has improved and his serum creatinine is now 1.36.  I have recommended that we discontinue Xarelto.  I am changing him to eliquis 5 mg twice a day.  I also have recommended he discontinue aspirin.  I am adding amiodarone 20 mg daily in attempt to hopefully pharmacologically cardiovert him.  However, with his significant a large right atrium.  He may need intermittent rhythmic therapy for sinus rhythm preservation if he is able to be successfully cardioverted.  I again reinforced importance of 100% CPAP use.  I will see him in 3-4 weeks for follow-up evaluation.  At that time, after he has been on therapeutic anticoagulation for at least a month and he still in atrial fibrillation plans will be made for outpatient DC cardioversion.   Time spent: 25 minutes  Troy Sine, MD, [facility] 12/18/2014 9:17 AM

## 2014-12-21 ENCOUNTER — Telehealth: Payer: Self-pay | Admitting: *Deleted

## 2014-12-21 NOTE — Telephone Encounter (Signed)
Did CPAP referral to [facility].

## 2015-01-03 ENCOUNTER — Encounter: Payer: Self-pay | Admitting: *Deleted

## 2015-01-04 ENCOUNTER — Other Ambulatory Visit: Payer: Self-pay | Admitting: *Deleted

## 2015-01-04 MED ORDER — ALLOPURINOL 100 MG PO TABS: ORAL_TABLET | ORAL | Status: DC

## 2015-01-04 NOTE — Telephone Encounter (Signed)
[facility] 

## 2015-01-13 ENCOUNTER — Other Ambulatory Visit: Payer: Self-pay | Admitting: Internal Medicine

## 2015-01-24 ENCOUNTER — Ambulatory Visit: Payer: Commercial Managed Care - HMO | Admitting: Cardiovascular Disease

## 2015-02-16 ENCOUNTER — Telehealth: Payer: Self-pay | Admitting: *Deleted

## 2015-02-16 MED ORDER — AMBULATORY NON FORMULARY MEDICATION: Status: DC

## 2015-02-16 NOTE — Telephone Encounter (Signed)
Patient walked into office requesting [place] to fax a Rx to [employer] (Sr Customer Service Rep) at [address] [phone] Fax: [phone]. I called and spoke with Rodena Piety and she stated that she needed patient's Insurance information, Demographic sheet,OV and Rx saying CPAP supplies and she will contact patient.  Faxed.

## 2015-02-21 ENCOUNTER — Telehealth: Payer: Self-pay | Admitting: Cardiovascular Disease

## 2015-02-21 NOTE — Telephone Encounter (Signed)
Omar Lambert at 02/21/2015 10:51 AM     Status: Signed         Pt says he still have not received his C-Pap.       Routing to [place]. Please call, thanks

## 2015-02-21 NOTE — Telephone Encounter (Signed)
Pt says he still have not received his C-Pap.

## 2015-02-23 DIAGNOSIS — G4733 Obstructive sleep apnea (adult) (pediatric): Secondary | ICD-10-CM | POA: Diagnosis not present

## 2015-02-24 ENCOUNTER — Other Ambulatory Visit: Payer: Commercial Managed Care - HMO

## 2015-02-25 ENCOUNTER — Other Ambulatory Visit: Payer: Medicare HMO

## 2015-02-25 DIAGNOSIS — N183 Chronic kidney disease, stage 3 unspecified: Secondary | ICD-10-CM

## 2015-02-25 DIAGNOSIS — E1169 Type 2 diabetes mellitus with other specified complication: Secondary | ICD-10-CM | POA: Diagnosis not present

## 2015-02-25 DIAGNOSIS — E785 Hyperlipidemia, unspecified: Secondary | ICD-10-CM

## 2015-02-25 DIAGNOSIS — I483 Typical atrial flutter: Secondary | ICD-10-CM | POA: Diagnosis not present

## 2015-02-25 DIAGNOSIS — M1A0790 Idiopathic chronic gout, unspecified ankle and foot, without tophus (tophi): Secondary | ICD-10-CM | POA: Diagnosis not present

## 2015-02-25 DIAGNOSIS — E1122 Type 2 diabetes mellitus with diabetic chronic kidney disease: Secondary | ICD-10-CM | POA: Diagnosis not present

## 2015-02-26 LAB — LIPID PANEL
Chol/HDL Ratio: 2.6 ratio units (ref 0.0–5.0)
Cholesterol, Total: 171 mg/dL (ref 100–199)
HDL: 65 mg/dL (ref >39)
LDL Calculated: 95 mg/dL (ref 0–99)
Triglycerides: 55 mg/dL (ref 0–149)
VLDL Cholesterol Cal: 11 mg/dL (ref 5–40)

## 2015-02-26 LAB — COMPREHENSIVE METABOLIC PANEL
ALT: 16 IU/L (ref 0–44)
AST: 27 IU/L (ref 0–40)
Albumin/Globulin Ratio: 1.5 (ref 1.1–2.5)
Albumin: 4.1 g/dL (ref 3.5–4.8)
Alkaline Phosphatase: 47 IU/L (ref 39–117)
BUN/Creatinine Ratio: 11 (ref 10–22)
BUN: 16 mg/dL (ref 8–27)
Bilirubin Total: 0.5 mg/dL (ref 0.0–1.2)
CO2: 32 mmol/L — ABNORMAL HIGH (ref 18–29)
Calcium: 8.4 mg/dL — ABNORMAL LOW (ref 8.6–10.2)
Chloride: 97 mmol/L (ref 96–106)
Creatinine, Ser: 1.46 mg/dL — ABNORMAL HIGH (ref 0.76–1.27)
GFR calc Af Amer: 54 mL/min/{1.73_m2} — ABNORMAL LOW (ref >59)
GFR calc non Af Amer: 46 mL/min/{1.73_m2} — ABNORMAL LOW (ref >59)
Globulin, Total: 2.8 g/dL (ref 1.5–4.5)
Glucose: 92 mg/dL (ref 65–99)
Potassium: 4 mmol/L (ref 3.5–5.2)
Sodium: 144 mmol/L (ref 134–144)
Total Protein: 6.9 g/dL (ref 6.0–8.5)

## 2015-02-26 LAB — CBC WITH DIFFERENTIAL/PLATELET
Basophils Absolute: 0 10*3/uL (ref 0.0–0.2)
Basos: 0 %
EOS (ABSOLUTE): 0.4 10*3/uL (ref 0.0–0.4)
Eos: 8 %
Hematocrit: 39.8 % (ref 37.5–51.0)
Hemoglobin: 13 g/dL (ref 12.6–17.7)
Immature Grans (Abs): 0 10*3/uL (ref 0.0–0.1)
Immature Granulocytes: 0 %
Lymphocytes Absolute: 1.1 10*3/uL (ref 0.7–3.1)
Lymphs: 24 %
MCH: 30.1 pg (ref 26.6–33.0)
MCHC: 32.7 g/dL (ref 31.5–35.7)
MCV: 92 fL (ref 79–97)
Monocytes Absolute: 0.5 10*3/uL (ref 0.1–0.9)
Monocytes: 10 %
Neutrophils Absolute: 2.5 10*3/uL (ref 1.4–7.0)
Neutrophils: 58 %
Platelets: 201 10*3/uL (ref 150–379)
RBC: 4.32 x10E6/uL (ref 4.14–5.80)
RDW: 14.1 % (ref 12.3–15.4)
WBC: 4.5 10*3/uL (ref 3.4–10.8)

## 2015-02-26 LAB — HEMOGLOBIN A1C
Est. average glucose Bld gHb Est-mCnc: 128 mg/dL
Hgb A1c MFr Bld: 6.1 % — ABNORMAL HIGH (ref 4.8–5.6)

## 2015-02-28 ENCOUNTER — Ambulatory Visit (INDEPENDENT_AMBULATORY_CARE_PROVIDER_SITE_OTHER): Payer: Medicare HMO | Admitting: Internal Medicine

## 2015-02-28 ENCOUNTER — Encounter: Payer: Self-pay | Admitting: Internal Medicine

## 2015-02-28 VITALS — BP 156/92 | HR 72 | Temp 97.5°F | Resp 20 | Ht 68.0 in | Wt 254.8 lb

## 2015-02-28 DIAGNOSIS — Z0000 Encounter for general adult medical examination without abnormal findings: Secondary | ICD-10-CM | POA: Diagnosis not present

## 2015-02-28 DIAGNOSIS — J449 Chronic obstructive pulmonary disease, unspecified: Secondary | ICD-10-CM

## 2015-02-28 DIAGNOSIS — E1122 Type 2 diabetes mellitus with diabetic chronic kidney disease: Secondary | ICD-10-CM | POA: Diagnosis not present

## 2015-02-28 DIAGNOSIS — N183 Chronic kidney disease, stage 3 unspecified: Secondary | ICD-10-CM

## 2015-02-28 DIAGNOSIS — I483 Typical atrial flutter: Secondary | ICD-10-CM | POA: Diagnosis not present

## 2015-02-28 DIAGNOSIS — K219 Gastro-esophageal reflux disease without esophagitis: Secondary | ICD-10-CM | POA: Diagnosis not present

## 2015-02-28 DIAGNOSIS — Z9989 Dependence on other enabling machines and devices: Secondary | ICD-10-CM

## 2015-02-28 DIAGNOSIS — M1A0790 Idiopathic chronic gout, unspecified ankle and foot, without tophus (tophi): Secondary | ICD-10-CM

## 2015-02-28 DIAGNOSIS — M1000 Idiopathic gout, unspecified site: Secondary | ICD-10-CM | POA: Insufficient documentation

## 2015-02-28 DIAGNOSIS — E1169 Type 2 diabetes mellitus with other specified complication: Secondary | ICD-10-CM | POA: Insufficient documentation

## 2015-02-28 DIAGNOSIS — I10 Essential (primary) hypertension: Secondary | ICD-10-CM | POA: Insufficient documentation

## 2015-02-28 DIAGNOSIS — G4733 Obstructive sleep apnea (adult) (pediatric): Secondary | ICD-10-CM | POA: Diagnosis not present

## 2015-02-28 DIAGNOSIS — E785 Hyperlipidemia, unspecified: Secondary | ICD-10-CM

## 2015-02-28 DIAGNOSIS — N1832 Chronic kidney disease, stage 3b: Secondary | ICD-10-CM

## 2015-02-28 NOTE — Progress Notes (Signed)
Patient ID: Omar Lambert, male   DOB: [birth year], 75 y.o.   MRN: [id number]   Location: [facility]  Provider: Rexene Edison. Mariea Clonts, D.O., C.M.D.  Goals of Care: Advanced Directive information Does patient have an advance directive?: No, Would patient like information on creating an advanced directive?: Yes - Educational materials given (has not yet completed)  Chief Complaint  Patient presents with  . Annual Exam    Annual Exam    HPI: Patient is a 75 y.o. male seen in the office today for an annual wellness exam.    Depression screen [facility] 2/9 02/28/2015 08/19/2014 02/15/2014 03/09/2013  Decreased Interest 0 0 0 0  Down, Depressed, Hopeless 0 0 0 0  PHQ - 2 Score 0 0 0 0    Fall Risk  02/28/2015 11/22/2014 09/07/2014 08/19/2014 05/20/2014  Falls in the past year? No No No No No  Risk for fall due to : - Impaired mobility - - -   MMSE - Mini Mental State Exam 02/28/2015  Not completed: (No Data)  Orientation to time 5  Orientation to Place 5  Registration 3  Attention/ Calculation 0  Recall 1  Language- name 2 objects 2  Language- repeat 1  Language- follow 3 step command 3  Language- read & follow direction 1  Write a sentence 0  Copy design 1  Total score 22  failed clock drawing Finished 9th-10th grade   Health Maintenance  Topic Date Due  . ZOSTAVAX  05/28/1999  . URINE MICROALBUMIN  06/09/2014  . FOOT EXAM  05/20/2015  . HEMOGLOBIN A1C  08/25/2015  . INFLUENZA VACCINE  09/06/2015  . OPHTHALMOLOGY EXAM  09/07/2015  . COLONOSCOPY  02/05/2021  . TETANUS/TDAP  02/05/2021  . PNA vac Low Risk Adult  Completed   Urinary incontinence?  No difficulty making it to the restroom to urinate Functional status?  Independent, driving.   Exercise:  A little walking now--tries to walk daily, but less when it's cold out.  Walks around the circle two to three times.  20-30 mins. Diet:  Not eating the fried foods like he was.  Trying to learn which vegetables and fruits to eat.     Vision:  Saw Dr. Katy Fitch late last year for his eye exam.  No change in prescription.  Seeing pretty good. Hearing:  Hearing well.  No complaints.   Dentition:  No dental problems.  Has not seen b/c he has dentures.   Pain:  Gas and bloating in abdomen.  Feels like a flame in his abdomen.  Some aching in legs.  Start to act up more when he doesn't walk for a few days.    Hyperlipidemia:  Taking his pravachol, but has not taken regularly.  Will not change.    DMII:  Sugar average is unchanged--6.1 in prediabetic range.  Asks if there is a cheaper medication.  Discussed bringing formulary to make changes.  Gout:  Stable lately without flare ups.  Taking allopurinol mostly every day.    Chronic venous insufficiency:  Under control.    CHF:  Continues on torsemide, metoprolol succinate  COPD:  Breathing doing pretty well.  Also using his nebulizer.  Has not had to use albuterol inhaler.  Taking singulair pill.    GERD:  Still belching a lot, feels bloated and gassy.  Also has flatus.  Sometimes due to food choices.  Taking protonix for indigestion.    OSA:  Has his cpap--says he is using it every  night.  Atrial flutter:  Follows with cardiology.  On eliquis for anticoag and toprol xl for rate control, amiodarone.  Review of Systems:  Review of Systems  Constitutional: Negative for fever and chills.  HENT: Negative for congestion and hearing loss.   Eyes: Negative for blurred vision.       Glasses  Respiratory: Negative for shortness of breath.   Cardiovascular: Negative for chest pain and palpitations.  Gastrointestinal: Positive for heartburn. Negative for abdominal pain, diarrhea, constipation, blood in stool and melena.  Genitourinary: Negative for dysuria, urgency and frequency.  Musculoskeletal: Positive for joint pain. Negative for falls.  Skin: Negative for rash.  Neurological: Negative for dizziness and loss of consciousness.  Endo/Heme/Allergies: Does not bruise/bleed easily.   Psychiatric/Behavioral: Negative for depression and memory loss.       Limited education    Past Medical History  Diagnosis Date  . Benign essential hypertension   . Gout attack 09/2012  . Allergy   . Asthma   . Sinusitis, maxillary, chronic   . Type II or unspecified type diabetes mellitus without mention of complication, not stated as uncontrolled   . Atrophic gastritis   . COPD (chronic obstructive pulmonary disease) ([place])   . Diabetes ([place])   . GERD (gastroesophageal reflux disease)     Past Surgical History  Procedure Laterality Date  . Hernia repair  1980  . Colonoscopy  2003    No Known Allergies    Medication List       This list is accurate as of: 02/28/15  8:56 AM.  Always use your most recent med list.               albuterol 108 (90 Base) MCG/ACT inhaler  Commonly known as:  PROVENTIL HFA;VENTOLIN HFA  Inhale 2 puffs every 6 hours as needed for wheezing     albuterol (2.5 MG/3ML) 0.083% nebulizer solution  Commonly known as:  PROVENTIL  INHALE CONTENTS OF 1 VIAL IN NEBULIZER EVERY 4 HOURS AS NEEDED FOR WHEEZING AND ASTHMA     allopurinol 100 MG tablet  Commonly known as:  ZYLOPRIM  Take one tablet by mouth once daily for gout     AMBULATORY NON FORMULARY MEDICATION  CPAP Supplies Dx:G47.33     amiodarone 200 MG tablet  Commonly known as:  PACERONE  Take 1 tablet (200 mg total) by mouth daily.     apixaban 5 MG Tabs tablet  Commonly known as:  ELIQUIS  Take 1 tablet (5 mg total) by mouth 2 (two) times daily.     CENTRUM SILVER ADULT 50+ PO  Take 1 tablet by mouth daily.     colchicine 0.6 MG tablet  Take 1 tablet (0.6 mg total) by mouth daily as needed (gout flare-up).     metoprolol succinate 25 MG 24 hr tablet  Commonly known as:  TOPROL XL  Take 1/2-1 tablet daily     montelukast 10 MG tablet  Commonly known as:  SINGULAIR  TAKE ONE TABLET BY MOUTH AT BEDTIME AS NEEDED FOR ALLERGIES AND ASTHMA     mupirocin ointment 2 %   Commonly known as:  BACTROBAN  Apply 1 application topically 2 (two) times daily.     pantoprazole 40 MG tablet  Commonly known as:  PROTONIX  TAKE 1 TABLET BY MOUTH EVERY DAY     pravastatin 20 MG tablet  Commonly known as:  PRAVACHOL  Take 1 tablet (20 mg total) by mouth daily with supper.  torsemide 20 MG tablet  Commonly known as:  DEMADEX  TAKE 1 TABLET BY MOUTH TWICE A DAY FOR SWELLING (HEART FAILURE)     TRADJENTA 5 MG Tabs tablet  Generic drug:  linagliptin  TAKE 1 TABLET EVERY DAY        Physical Exam: Filed Vitals:   02/28/15 0840  Pulse: 72  Temp: 97.5 F (36.4 C)  TempSrc: Oral  Resp: 20  Height: 5\' 8"  (1.727 m)  Weight: 254 lb 12.8 oz (115.577 kg)  SpO2: 90%   Body mass index is 38.75 kg/(m^2). Physical Exam  Constitutional: He is oriented to person, place, and time. He appears well-developed and well-nourished. No distress.  HENT:  Head: Normocephalic and atraumatic.  Right Ear: External ear normal.  Left Ear: External ear normal.  Nose: Nose normal.  Mouth/Throat: Oropharynx is clear and moist. No oropharyngeal exudate.  Eyes: Conjunctivae and EOM are normal. Pupils are equal, round, and reactive to light. No scleral icterus.  glasses  Neck: Normal range of motion. Neck supple. No JVD present. No tracheal deviation present. No thyromegaly present.  Cardiovascular: Normal rate, regular rhythm and normal heart sounds.   1+ edema  Abdominal: Soft. Bowel sounds are normal. He exhibits distension. He exhibits no mass. There is no tenderness. There is no rebound and no guarding.  Musculoskeletal: Normal range of motion. He exhibits no tenderness.  Lymphadenopathy:    He has no cervical adenopathy.  Neurological: He is alert and oriented to person, place, and time. He has normal reflexes. No cranial nerve deficit. He exhibits normal muscle tone.  Skin: Skin is warm and dry.  Psychiatric: He has a normal mood and affect. His behavior is normal.  Thought content normal.    Labs reviewed: Basic Metabolic Panel:  Recent Labs  11/19/14 0828 12/13/14 0856 02/25/15 0949  NA 142 142 144  K 4.3 4.2 4.0  CL 97 98 97  CO2 30* 31 32*  GLUCOSE 97 84 92  BUN 25 24 16   CREATININE 1.53* 1.36* 1.46*  CALCIUM 9.7 9.0 8.4*  MG  --  1.8  --   TSH  --  3.535  --    Liver Function Tests:  Recent Labs  08/16/14 0853 12/13/14 0856 02/25/15 0949  AST 56* 31 27  ALT 29 16 16   ALKPHOS 55 42 47  BILITOT 0.8 0.5 0.5  PROT 7.2 7.3 6.9  ALBUMIN 4.3 3.8 4.1   No results for input(s): LIPASE, AMYLASE in the last 8760 hours. No results for input(s): AMMONIA in the last 8760 hours. CBC:  Recent Labs  05/18/14 0808 08/16/14 0853 12/13/14 0856 02/25/15 0949  WBC 4.3 3.8 4.3 4.5  NEUTROABS 2.0 2.4  --  2.5  HGB 14.8  --  13.5  --   HCT 46.8 43.9 41.3 39.8  MCV 93 90 90.2 92  PLT  --  188 236 201   Lipid Panel:  Recent Labs  11/19/14 0828 12/13/14 0856 02/25/15 0949  CHOL 189 174 171  HDL 53 55 65  LDLCALC 124* 106 95  TRIG 61 63 55  CHOLHDL 3.6 3.2 2.6   Lab Results  Component Value Date   HGBA1C 6.1* 02/25/2015    Procedures: EKG done at cardiology 12/16/14 reviewed:  afib at 80bpm  Assessment/Plan 1. Medicare annual wellness visit, subsequent - see hpi - urine microalbumin done today  2. Controlled type 2 diabetes mellitus with stage 3 chronic kidney disease, without long-term current use of insulin (HCC) -hba1c  improving gradually--he is walking and trying to eat better, taking tradjenta (some cost concerns)  - Microalbumin/Creatinine Ratio, Urine - Amb ref to Medical Nutrition Therapy-MNT--needs additional dietary counseling--recommended he and his wife attend to learn more about healthy fruits and vegetables - Basic metabolic panel; Future - Hemoglobin A1c; Future  3. OSA on CPAP -says he is using his cpap qhs--cont this  4. COPD GOLD II -cont singulair, nebs and albuterol prn -stable and breathing  well  5. Chronic kidney disease (CKD) stage G3b/A2, moderately decreased glomerular filtration rate (GFR) between 30-44 mL/min/1.73 square meter and albuminuria creatinine ratio between 30-299 mg/g - Amb ref to Medical Nutrition Therapy-MNT - Basic metabolic panel; Future - avoid nsaids, hydrate  6. Hyperlipidemia associated with type 2 diabetes mellitus (HCC) - cont pravachol 20mg --must take nightly and has not been--counseled today - Amb ref to Medical Nutrition Therapy-MNT - Lipid panel; Future  7. Morbid obesity due to excess calories ([place]) - is walking more, must still work on his diet so referred for further education - Amb ref to Medical Nutrition Therapy-MNT - Basic metabolic panel; Future - Hemoglobin A1c; Future - Lipid panel; Future  8. Gastroesophageal reflux disease without esophagitis - cont protonix and will refer to dietitian for education on his dietary choices to decrease gas - Amb ref to Medical Nutrition Therapy-MNT  9. Idiopathic chronic gout of foot without tophus, unspecified laterality -cont allopurinol and prn colchicine for acute flares--none recently  10. Typical atrial flutter ([place]) -continue to follow with cardiology, cont eliquis, amiodarone and metoprolol succinate  -rate controlled, tolerating these meds w/o known complications  11. Essential hypertension, benign - bp elevated, but reports he did not take all of his meds this am--has been counseled on several occasions that he must take all of his meds regularly even when he has doctors' visits - Amb ref to Medical Nutrition Therapy-MNT - Basic metabolic panel; Future  Labs/tests ordered:   Orders Placed This Encounter  Procedures  . Microalbumin/Creatinine Ratio, Urine  . Basic metabolic panel    Standing Status: Future     Number of Occurrences:      Standing Expiration Date: 08/28/2015    Order Specific Question:  Has the patient fasted?    Answer:  Yes  . Hemoglobin A1c    Standing  Status: Future     Number of Occurrences:      Standing Expiration Date: 08/28/2015  . Lipid panel    Standing Status: Future     Number of Occurrences:      Standing Expiration Date: 08/28/2015    Order Specific Question:  Has the patient fasted?    Answer:  Yes  . Amb ref to Medical Nutrition Therapy-MNT    Referral Priority:  Routine    Referral Type:  Consultation    Referral Reason:  Specialty Services Required    Requested Specialty:  Nutrition    Number of Visits Requested:  1   Next appt:  05/30/2015 with labs before  Need to discuss goals of care again when his wife is with him.  Lillar Bianca L. Rommel Hogston, D.O. [facility] [address], [place] [address] Cell Phone (Mon-Fri 8am-5pm):  [phone] On Call:  [phone] & follow prompts after 5pm & weekends Office Phone:  [phone] Office Fax:  [phone]

## 2015-03-01 LAB — MICROALBUMIN / CREATININE URINE RATIO
Creatinine, Urine: 249.2 mg/dL
MICROALB/CREAT RATIO: 35.3 mg/g creat — ABNORMAL HIGH (ref 0.0–30.0)
Microalbumin, Urine: 87.9 ug/mL

## 2015-03-10 ENCOUNTER — Telehealth: Payer: Self-pay | Admitting: Internal Medicine

## 2015-03-10 NOTE — Telephone Encounter (Signed)
FYI - Patient declined services for Nutrition referral - Stated he would call back if he decides to schedule

## 2015-03-11 NOTE — Telephone Encounter (Signed)
Noted.  He agreed at the appt.  He really needs it, but if he won't even go, I'm sure he will not listen to the advice anyway.

## 2015-03-18 ENCOUNTER — Telehealth: Payer: Self-pay | Admitting: *Deleted

## 2015-03-18 NOTE — Telephone Encounter (Signed)
There is no alternative.  If he's already been on it for 6 months, he can stop it and we'll order it again if he has a flare-up.

## 2015-03-18 NOTE — Telephone Encounter (Signed)
Patient walked into office and stated that he needed an alternative medication for Colchicine 0.6mg . The Colchicine is $169.00 and can't afford this. Please Advise.

## 2015-03-18 NOTE — Telephone Encounter (Signed)
Patient notified and agreed.  

## 2015-03-24 NOTE — Telephone Encounter (Signed)
Per [employer] they have made several attempts to contact patient and was not able to make contact. Will wait for patient to return call

## 2015-03-28 ENCOUNTER — Other Ambulatory Visit: Payer: Self-pay | Admitting: *Deleted

## 2015-03-28 MED ORDER — ALLOPURINOL 100 MG PO TABS: ORAL_TABLET | ORAL | Status: DC

## 2015-03-28 NOTE — Telephone Encounter (Signed)
Patient requested refill to be faxed to pharmacy. Faxed.

## 2015-03-31 ENCOUNTER — Ambulatory Visit: Payer: Commercial Managed Care - HMO | Admitting: Cardiovascular Disease

## 2015-04-01 ENCOUNTER — Encounter (HOSPITAL_COMMUNITY): Payer: Self-pay | Admitting: Oncology

## 2015-04-01 ENCOUNTER — Emergency Department (HOSPITAL_COMMUNITY)
Admission: EM | Admit: 2015-04-01 | Discharge: 2015-04-02 | Disposition: A | Discharge status: Home / Self Care | Payer: Commercial Managed Care - HMO | Attending: Emergency Medicine | Admitting: Emergency Medicine

## 2015-04-01 DIAGNOSIS — Z87891 Personal history of nicotine dependence: Secondary | ICD-10-CM | POA: Diagnosis not present

## 2015-04-01 DIAGNOSIS — E119 Type 2 diabetes mellitus without complications: Secondary | ICD-10-CM | POA: Diagnosis not present

## 2015-04-01 DIAGNOSIS — Z7902 Long term (current) use of antithrombotics/antiplatelets: Secondary | ICD-10-CM | POA: Insufficient documentation

## 2015-04-01 DIAGNOSIS — J449 Chronic obstructive pulmonary disease, unspecified: Secondary | ICD-10-CM | POA: Insufficient documentation

## 2015-04-01 DIAGNOSIS — I10 Essential (primary) hypertension: Secondary | ICD-10-CM | POA: Diagnosis not present

## 2015-04-01 DIAGNOSIS — Z79899 Other long term (current) drug therapy: Secondary | ICD-10-CM | POA: Diagnosis not present

## 2015-04-01 DIAGNOSIS — M109 Gout, unspecified: Secondary | ICD-10-CM

## 2015-04-01 DIAGNOSIS — R2232 Localized swelling, mass and lump, left upper limb: Secondary | ICD-10-CM | POA: Diagnosis present

## 2015-04-01 DIAGNOSIS — M1A0790 Idiopathic chronic gout, unspecified ankle and foot, without tophus (tophi): Secondary | ICD-10-CM

## 2015-04-01 DIAGNOSIS — K219 Gastro-esophageal reflux disease without esophagitis: Secondary | ICD-10-CM | POA: Insufficient documentation

## 2015-04-01 DIAGNOSIS — M1000 Idiopathic gout, unspecified site: Secondary | ICD-10-CM | POA: Insufficient documentation

## 2015-04-01 DIAGNOSIS — M25532 Pain in left wrist: Secondary | ICD-10-CM | POA: Diagnosis not present

## 2015-04-01 NOTE — ED Notes (Addendum)
Pt c/o left arm swelling and pain x 1 week.  Pt w/ hx of gout.  Rates pain 9/10, aching and throbbing in nature. Denies injury.

## 2015-04-02 LAB — CBG MONITORING, ED: GLUCOSE-CAPILLARY: 88 mg/dL (ref 65–99)

## 2015-04-02 MED ORDER — COLCHICINE 0.6 MG PO TABS
0.6000 mg | ORAL_TABLET | Freq: Every day | ORAL | Status: DC | PRN
Start: 2015-04-02 — End: 2015-05-30

## 2015-04-02 MED ORDER — OXYCODONE-ACETAMINOPHEN 5-325 MG PO TABS: 1.0000 | ORAL_TABLET | ORAL | Status: DC | PRN

## 2015-04-02 MED ORDER — DEXAMETHASONE SODIUM PHOSPHATE 10 MG/ML IJ SOLN
10.0000 mg | Freq: Once | INTRAMUSCULAR | Status: AC
  Administered 2015-04-02: 10 mg via INTRAMUSCULAR
  Filled 2015-04-02: qty 1

## 2015-04-02 NOTE — ED Provider Notes (Signed)
CSN: [id number]     Arrival date & time 04/01/15  2145 History  By signing my name below, I, [name], attest that this documentation has been prepared under the direction and in the presence of Orpah Greek, MD. Electronically Signed: Randa Evens, ED Scribe. 04/02/2015. 12:50 AM.     Chief Complaint  Patient presents with  . Arm Swelling   The history is provided by the patient. No language interpreter was used.   HPI Comments: Omar Lambert is a 75 y.o. male who presents to the Emergency Department complaining of left arm and wrist swelling onset 1 week prior. Pt states he has a Hx of gout in his feet and hans. Pt does report associated pain. Pt states that he has been complaint with taking allopurinol but it's not providing any relief. Pt reports that he is on anticoagulants. Pt denies fall or injury.    Past Medical History  Diagnosis Date  . Benign essential hypertension   . Gout attack 09/2012  . Allergy   . Asthma   . Sinusitis, maxillary, chronic   . Type II or unspecified type diabetes mellitus without mention of complication, not stated as uncontrolled   . Atrophic gastritis   . COPD (chronic obstructive pulmonary disease) ([place])   . Diabetes ([place])   . GERD (gastroesophageal reflux disease)    Past Surgical History  Procedure Laterality Date  . Hernia repair  1980  . Colonoscopy  2003   Family History  Problem Relation Age of Onset  . Stroke Mother   . Cancer Sister   . Diabetes Sister   . COPD Sister   . COPD Sister    Social History  Substance Use Topics  . Smoking status: Former Smoker -- 1.00 packs/day for 20 years    Types: Cigarettes    Quit date: 06/17/1997  . Smokeless tobacco: Never Used  . Alcohol Use: No    Review of Systems  Musculoskeletal: Positive for joint swelling and arthralgias.  All other systems reviewed and are negative.    Allergies  Review of patient's allergies indicates no known allergies.  Home  Medications   Prior to Admission medications   Medication Sig Start Date End Date Taking? Authorizing Provider  albuterol (PROVENTIL HFA;VENTOLIN HFA) 108 (90 BASE) MCG/ACT inhaler Inhale 2 puffs every 6 hours as needed for wheezing 08/23/14   [name] L [name], DO  albuterol (PROVENTIL) (2.5 MG/3ML) 0.083% nebulizer solution INHALE CONTENTS OF 1 VIAL IN NEBULIZER EVERY 4 HOURS AS NEEDED FOR WHEEZING AND ASTHMA 01/13/15   [name] L [name], DO  allopurinol (ZYLOPRIM) 100 MG tablet Take one tablet by mouth once daily for gout 03/28/15   [name] L [name], DO  AMBULATORY NON FORMULARY MEDICATION CPAP Supplies Dx:G47.33 02/16/15   [name] L [name], DO  amiodarone (PACERONE) 200 MG tablet Take 1 tablet (200 mg total) by mouth daily. 12/16/14   Troy Sine, MD  apixaban (ELIQUIS) 5 MG TABS tablet Take 1 tablet (5 mg total) by mouth 2 (two) times daily. 12/16/14   Troy Sine, MD  colchicine 0.6 MG tablet Take 1 tablet (0.6 mg total) by mouth daily as needed (gout flare-up). 11/22/14   [name] L [name], DO  metoprolol succinate (TOPROL XL) 25 MG 24 hr tablet Take 1/2-1 tablet daily 11/10/14   Troy Sine, MD  montelukast (SINGULAIR) 10 MG tablet TAKE ONE TABLET BY MOUTH AT BEDTIME AS NEEDED FOR ALLERGIES AND ASTHMA 10/25/14   Gayland Curry, DO  Multiple Vitamins-Minerals (CENTRUM SILVER ADULT 50+ PO) Take 1 tablet by mouth daily.    Historical Provider, MD  mupirocin ointment (BACTROBAN) 2 % Apply 1 application topically 2 (two) times daily. 09/07/14   Lauree Chandler, NP  pantoprazole (PROTONIX) 40 MG tablet TAKE 1 TABLET BY MOUTH EVERY DAY 11/10/14   Jerene Bears, MD  pravastatin (PRAVACHOL) 20 MG tablet Take 1 tablet (20 mg total) by mouth daily with supper. 11/22/14   [name] L [name], DO  torsemide (DEMADEX) 20 MG tablet TAKE 1 TABLET BY MOUTH TWICE A DAY FOR SWELLING (HEART FAILURE) 09/27/14   [name] L [name], DO  TRADJENTA 5 MG TABS tablet TAKE 1 TABLET EVERY DAY 09/27/14   Lauree Chandler, NP   BP 149/88  mmHg  Pulse 112  Temp(Src) 98 F (36.7 C) (Oral)  Resp 20  SpO2 95%   Physical Exam  Constitutional: He is oriented to person, place, and time. He appears well-developed and well-nourished. No distress.  HENT:  Head: Normocephalic and atraumatic.  Right Ear: Hearing normal.  Left Ear: Hearing normal.  Nose: Nose normal.  Mouth/Throat: Oropharynx is clear and moist and mucous membranes are normal.  Eyes: Conjunctivae and EOM are normal. Pupils are equal, round, and reactive to light.  Neck: Normal range of motion. Neck supple.  Cardiovascular: Regular rhythm, S1 normal and S2 normal.  Exam reveals no gallop and no friction rub.   No murmur heard. Pulses:      Radial pulses are 2+ on the left side.  Pulmonary/Chest: Effort normal and breath sounds normal. No respiratory distress. He exhibits no tenderness.  Abdominal: Soft. Normal appearance and bowel sounds are normal. There is no hepatosplenomegaly. There is no tenderness. There is no rebound, no guarding, no tenderness at McBurney's point and negative Murphy's sign. No hernia.  Musculoskeletal: Normal range of motion. He exhibits tenderness.  Swelling and tenderness of left wrist, no erythema or warmth    Neurological: He is alert and oriented to person, place, and time. He has normal strength. No cranial nerve deficit or sensory deficit. Coordination normal. GCS eye subscore is 4. GCS verbal subscore is 5. GCS motor subscore is 6.  Skin: Skin is warm, dry and intact. No rash noted. No cyanosis.  Psychiatric: He has a normal mood and affect. His speech is normal and behavior is normal. Thought content normal.  Nursing note and vitals reviewed.   ED Course  Procedures (including critical care time) DIAGNOSTIC STUDIES: Oxygen Saturation is 95% on RA, normal by my interpretation.    COORDINATION OF CARE: 12:53 AM-Discussed treatment plan with pt at bedside and pt agreed to plan.     Labs Review Labs Reviewed  CBG MONITORING,  ED    Imaging Review No results found.    EKG Interpretation None      MDM   Final diagnoses:  None  gout  Patient presents to the emergency room for evaluation of nontraumatic pain and swelling of left wrist. Patient does have history of gout with similar symptoms. Symptoms are isolated to the wrist joint area. He does not have diffuse arm swelling. There are no overlying skin changes to suggest cellulitis or septic joint.  Patient treated with Decadron in the ER and will be treated with Percocet and colchicine as an outpatient. Follow-up with primary doctor Monday. Return if symptoms worsen.  I personally performed the services described in this documentation, which was scribed in my presence. The recorded information has been reviewed and  is accurate.     Orpah Greek, MD 04/02/15 [phone]

## 2015-04-02 NOTE — Discharge Instructions (Signed)

## 2015-04-06 ENCOUNTER — Encounter: Payer: Self-pay | Admitting: Internal Medicine

## 2015-04-06 ENCOUNTER — Ambulatory Visit
Admission: RE | Admit: 2015-04-06 | Discharge: 2015-04-06 | Disposition: A | Discharge status: Home / Self Care | Payer: Commercial Managed Care - HMO | Source: Ambulatory Visit | Attending: Internal Medicine | Admitting: Internal Medicine

## 2015-04-06 ENCOUNTER — Telehealth: Payer: Self-pay

## 2015-04-06 ENCOUNTER — Ambulatory Visit (INDEPENDENT_AMBULATORY_CARE_PROVIDER_SITE_OTHER): Payer: Medicare HMO | Admitting: Internal Medicine

## 2015-04-06 VITALS — HR 82 | Temp 97.5°F | Resp 20 | Ht 68.0 in | Wt 246.0 lb

## 2015-04-06 DIAGNOSIS — M25532 Pain in left wrist: Secondary | ICD-10-CM

## 2015-04-06 DIAGNOSIS — M19032 Primary osteoarthritis, left wrist: Secondary | ICD-10-CM | POA: Diagnosis not present

## 2015-04-06 DIAGNOSIS — M1A9XX0 Chronic gout, unspecified, without tophus (tophi): Secondary | ICD-10-CM

## 2015-04-06 DIAGNOSIS — M7989 Other specified soft tissue disorders: Secondary | ICD-10-CM

## 2015-04-06 DIAGNOSIS — M25432 Effusion, left wrist: Secondary | ICD-10-CM

## 2015-04-06 MED ORDER — METHYLPREDNISOLONE 4 MG PO TBPK: ORAL_TABLET | ORAL | Status: DC

## 2015-04-06 MED ORDER — OXYCODONE-ACETAMINOPHEN 5-325 MG PO TABS: 1.0000 | ORAL_TABLET | ORAL | Status: DC | PRN

## 2015-04-06 NOTE — Progress Notes (Signed)
Patient ID: Omar Lambert, male   DOB: 1940-02-26, 75 y.o.   MRN: [id number]    Location:    [facility]   Place of Service:  OFFICE   Chief Complaint  Patient presents with  . Follow-up    TOC follow-up form ER, Patients right hand is swollen    HPI:  75 yo male seen today for ER f/u. He was seen in the ED last week for left wrist swelling. He was dx with gout flare. He takes allopurinol as Rx. He was tx with decadron in the ED and given rx for percocet and colchicine. Swelling improved but still present. He has pain with movement of left wrist. Last uric acid in 2015. No recent imaging studies. Cr 1.46 about 1 month ago. He takes allopurinol 100mg  daily  Past Medical History  Diagnosis Date  . Benign essential hypertension   . Gout attack 09/2012  . Allergy   . Asthma   . Sinusitis, maxillary, chronic   . Type II or unspecified type diabetes mellitus without mention of complication, not stated as uncontrolled   . Atrophic gastritis   . COPD (chronic obstructive pulmonary disease) ([place])   . Diabetes ([place])   . GERD (gastroesophageal reflux disease)     Past Surgical History  Procedure Laterality Date  . Hernia repair  1980  . Colonoscopy  2003    Patient Care Team: Gayland Curry, DO as PCP - General (Geriatric Medicine) Troy Sine, MD as Consulting Physician (Cardiology) Jerene Bears, MD as Consulting Physician (Gastroenterology) Tanda Rockers, MD as Consulting Physician (Pulmonary Disease) Clent Jacks, MD as Consulting Physician (Ophthalmology)  Social History   Social History  . Marital Status: Married    Spouse Name: N/A  . Number of Children: 3  . Years of Education: N/A   Occupational History  . Retired    Social History Main Topics  . Smoking status: Former Smoker -- 1.00 packs/day for 20 years    Types: Cigarettes    Quit date: 06/17/1997  . Smokeless tobacco: Never Used  . Alcohol Use: No  . Drug Use: No  . Sexual Activity: Not on file   Other  Topics Concern  . Not on file   Social History Narrative   Lives in 1 story apt with his wife, Jearld Romberg.  Sometimes exercises.  Drinks coffee.       reports that he quit smoking about 17 years ago. His smoking use included Cigarettes. He has a 20 pack-year smoking history. He has never used smokeless tobacco. He reports that he does not drink alcohol or use illicit drugs.  No Known Allergies  Medications: Patient's Medications  New Prescriptions   No medications on file  Previous Medications   ALBUTEROL (PROVENTIL HFA;VENTOLIN HFA) 108 (90 BASE) MCG/ACT INHALER    Inhale 2 puffs every 6 hours as needed for wheezing   ALBUTEROL (PROVENTIL) (2.5 MG/3ML) 0.083% NEBULIZER SOLUTION    INHALE CONTENTS OF 1 VIAL IN NEBULIZER EVERY 4 HOURS AS NEEDED FOR WHEEZING AND ASTHMA   ALLOPURINOL (ZYLOPRIM) 100 MG TABLET    Take one tablet by mouth once daily for gout   AMBULATORY NON FORMULARY MEDICATION    CPAP Supplies Dx:G47.33   AMIODARONE (PACERONE) 200 MG TABLET    Take 1 tablet (200 mg total) by mouth daily.   APIXABAN (ELIQUIS) 5 MG TABS TABLET    Take 1 tablet (5 mg total) by mouth 2 (two) times daily.   COLCHICINE  0.6 MG TABLET    Take 1 tablet (0.6 mg total) by mouth daily as needed (gout flare-up).   METOPROLOL SUCCINATE (TOPROL XL) 25 MG 24 HR TABLET    Take 1/2-1 tablet daily   MONTELUKAST (SINGULAIR) 10 MG TABLET    TAKE ONE TABLET BY MOUTH AT BEDTIME AS NEEDED FOR ALLERGIES AND ASTHMA   MULTIPLE VITAMINS-MINERALS (CENTRUM SILVER ADULT 50+ PO)    Take 1 tablet by mouth daily.   MUPIROCIN OINTMENT (BACTROBAN) 2 %    Apply 1 application topically 2 (two) times daily.   OXYCODONE-ACETAMINOPHEN (PERCOCET) 5-325 MG TABLET    Take 1 tablet by mouth every 4 (four) hours as needed.   PANTOPRAZOLE (PROTONIX) 40 MG TABLET    TAKE 1 TABLET BY MOUTH EVERY DAY   PRAVASTATIN (PRAVACHOL) 20 MG TABLET    Take 1 tablet (20 mg total) by mouth daily with supper.   TORSEMIDE (DEMADEX) 20 MG TABLET     TAKE 1 TABLET BY MOUTH TWICE A DAY FOR SWELLING (HEART FAILURE)   TRADJENTA 5 MG TABS TABLET    TAKE 1 TABLET EVERY DAY  Modified Medications   No medications on file  Discontinued Medications   No medications on file    Review of Systems  Unable to perform ROS: Other    Filed Vitals:   04/06/15 0907  Pulse: 82  Temp: 97.5 F (36.4 C)  TempSrc: Oral  Resp: 20  Height: 5\' 8"  (1.727 m)  Weight: 246 lb (111.585 kg)  SpO2: 90%   Body mass index is 37.41 kg/(m^2).  Physical Exam  Constitutional: He appears well-developed and well-nourished.  Cardiovascular:  Pulses:      Radial pulses are 2+ on the right side, and 2+ on the left side.  Musculoskeletal: He exhibits edema and tenderness.  Markedly reduced ROM left wrist with significant swelling and TTP. No increased warmth to touch or redness. Left hand swelling extending into digits. Ulnar and radial pulses palpable. Grip strength reduced due to inability close fist. Reduced supination of forearm due to restriction of motion at wrist.. Right hand nml ROM and no swelling at wrist/hand  Neurological: He is alert.  Skin: Skin is warm and dry. No rash noted.  Psychiatric: He has a normal mood and affect. His speech is normal. He is slowed.     Labs reviewed: Admission on 04/01/2015, Discharged on 04/02/2015  Component Date Value Ref Range Status  . Glucose-Capillary 04/02/2015 88  65 - 99 mg/dL Final  . Comment 1 04/02/2015 Notify RN   Final  Office Visit on 02/28/2015  Component Date Value Ref Range Status  . Creatinine, Urine 02/28/2015 249.2  Not Estab. mg/dL Final  . Microalbum.,U,Random 02/28/2015 87.9  Not Estab. ug/mL Final  . MICROALB/CREAT RATIO 02/28/2015 35.3* 0.0 - 30.0 mg/g creat Final  Appointment on 02/25/2015  Component Date Value Ref Range Status  . WBC 02/25/2015 4.5  3.4 - 10.8 x10E3/uL Final  . RBC 02/25/2015 4.32  4.14 - 5.80 x10E6/uL Final  . Hemoglobin 02/25/2015 13.0  12.6 - 17.7 g/dL Final  .  Hematocrit 02/25/2015 39.8  37.5 - 51.0 % Final  . MCV 02/25/2015 92  79 - 97 fL Final  . MCH 02/25/2015 30.1  26.6 - 33.0 pg Final  . MCHC 02/25/2015 32.7  31.5 - 35.7 g/dL Final  . RDW 02/25/2015 14.1  12.3 - 15.4 % Final  . Platelets 02/25/2015 201  150 - 379 x10E3/uL Final  . Neutrophils 02/25/2015 58  Final  . Lymphs 02/25/2015 24   Final  . Monocytes 02/25/2015 10   Final  . Eos 02/25/2015 8   Final  . Basos 02/25/2015 0   Final  . Neutrophils Absolute 02/25/2015 2.5  1.4 - 7.0 x10E3/uL Final  . Lymphocytes Absolute 02/25/2015 1.1  0.7 - 3.1 x10E3/uL Final  . Monocytes Absolute 02/25/2015 0.5  0.1 - 0.9 x10E3/uL Final  . EOS (ABSOLUTE) 02/25/2015 0.4  0.0 - 0.4 x10E3/uL Final  . Basophils Absolute 02/25/2015 0.0  0.0 - 0.2 x10E3/uL Final  . Immature Granulocytes 02/25/2015 0   Final  . Immature Grans (Abs) 02/25/2015 0.0  0.0 - 0.1 x10E3/uL Final  . Glucose 02/25/2015 92  65 - 99 mg/dL Final  . BUN 02/25/2015 16  8 - 27 mg/dL Final  . Creatinine, Ser 02/25/2015 1.46* 0.76 - 1.27 mg/dL Final  . GFR calc non Af Amer 02/25/2015 46* >59 mL/min/1.73 Final  . GFR calc Af Amer 02/25/2015 54* >59 mL/min/1.73 Final  . BUN/Creatinine Ratio 02/25/2015 11  10 - 22 Final  . Sodium 02/25/2015 144  134 - 144 mmol/L Final  . Potassium 02/25/2015 4.0  3.5 - 5.2 mmol/L Final  . Chloride 02/25/2015 97  96 - 106 mmol/L Final  . CO2 02/25/2015 32* 18 - 29 mmol/L Final  . Calcium 02/25/2015 8.4* 8.6 - 10.2 mg/dL Final  . Total Protein 02/25/2015 6.9  6.0 - 8.5 g/dL Final  . Albumin 02/25/2015 4.1  3.5 - 4.8 g/dL Final  . Globulin, Total 02/25/2015 2.8  1.5 - 4.5 g/dL Final  . Albumin/Globulin Ratio 02/25/2015 1.5  1.1 - 2.5 Final  . Bilirubin Total 02/25/2015 0.5  0.0 - 1.2 mg/dL Final  . Alkaline Phosphatase 02/25/2015 47  39 - 117 IU/L Final  . AST 02/25/2015 27  0 - 40 IU/L Final  . ALT 02/25/2015 16  0 - 44 IU/L Final  . Hgb A1c MFr Bld 02/25/2015 6.1* 4.8 - 5.6 % Final   Comment:           Pre-diabetes: 5.7 - 6.4          Diabetes: >6.4          Glycemic control for adults with diabetes: <7.0   . Est. average glucose Bld gHb Est-m* 02/25/2015 128   Final  . Cholesterol, Total 02/25/2015 171  100 - 199 mg/dL Final  . Triglycerides 02/25/2015 55  0 - 149 mg/dL Final  . HDL 02/25/2015 65  >39 mg/dL Final  . VLDL Cholesterol Cal 02/25/2015 11  5 - 40 mg/dL Final  . LDL Calculated 02/25/2015 95  0 - 99 mg/dL Final  . Chol/HDL Ratio 02/25/2015 2.6  0.0 - 5.0 ratio units Final   Comment:                                   T. Chol/HDL Ratio                                             Men  Women                               1/2 Avg.Risk  3.4    3.3  Avg.Risk  5.0    4.4                                2X Avg.Risk  9.6    7.1                                3X Avg.Risk 23.4   11.0     No results found.   Assessment/Plan   ICD-9-CM ICD-10-CM   1. Pain and swelling of left wrist 719.43 M25.532 DG Wrist Complete Left   719.03 M25.432 DG Hand Complete Left     Uric Acid     oxyCODONE-acetaminophen (PERCOCET) 5-325 MG tablet  2. Swelling of left hand 729.81 M79.89 DG Wrist Complete Left     DG Hand Complete Left     Uric Acid  3. Chronic gout without tophus, unspecified cause, unspecified site 274.02 [id number] Uric Acid    Go for left hand and wrist xray TODAY. Will call with results  Pain control   Check uric acid level. May need to increase allopurinol to 300mg  daily as CrCl >50cc/min  Continue current medications as ordered  May need Orthopedic evaluation  Follow up as scheduled with Dr Lillie Fragmin. Perlie Gold  [facility] [address] [place], [place] [address] [phone] Cell (Monday-Friday 8 AM - 5 PM) [phone] After 5 PM and follow prompts

## 2015-04-06 NOTE — Telephone Encounter (Signed)
-----   Message from [place], [place] sent at 04/06/2015 12:48 PM EST ----- Mild arthritis seen on xray but no changes related to gout and no fx/dislocation - Rx medrol dose pak #21 tabs; take as directed  No RF; cont percocet for pain; if no better in 1 week, will need Ortho vs rheum eval. T/c increasing allopurinol to 300mg  daily

## 2015-04-06 NOTE — Patient Instructions (Signed)
Go for left hand and wrist xray TODAY. Will call with results  Continue current medications as ordered  May need Orthopedic evaluation  Follow up as scheduled with Dr Mariea Clonts

## 2015-04-06 NOTE — Telephone Encounter (Signed)
Discussed with patient, patient verbalized understanding of results. RX sent to the pharmacy

## 2015-04-07 LAB — URIC ACID: Uric Acid: 11 mg/dL — ABNORMAL HIGH (ref 3.7–8.6)

## 2015-04-12 ENCOUNTER — Telehealth: Payer: Self-pay

## 2015-04-12 NOTE — Telephone Encounter (Signed)
Discussed with patient, patient verbalized understanding of results. Patient said he does not need a rx sent to the pharmacy at this time. Patient will take 3 of the 100 mg tablets and call when he is ready for a new rx. Diet and copy of labs mailed.

## 2015-04-12 NOTE — Telephone Encounter (Signed)
-----   Message from [place], [place] sent at 04/07/2015 12:02 PM EST ----- Gout is uncontrolled; please send purine free diet information; increase allopurinol 300mg  daily #30 with 3 RF; f/u as scheduled with Dr Mariea Clonts

## 2015-04-18 ENCOUNTER — Ambulatory Visit (INDEPENDENT_AMBULATORY_CARE_PROVIDER_SITE_OTHER): Payer: Commercial Managed Care - HMO | Admitting: Cardiovascular Disease

## 2015-04-18 ENCOUNTER — Encounter: Payer: Self-pay | Admitting: Cardiovascular Disease

## 2015-04-18 VITALS — BP 110/66 | HR 92 | Ht 67.0 in | Wt 243.9 lb

## 2015-04-18 DIAGNOSIS — Z7901 Long term (current) use of anticoagulants: Secondary | ICD-10-CM

## 2015-04-18 DIAGNOSIS — D689 Coagulation defect, unspecified: Secondary | ICD-10-CM

## 2015-04-18 DIAGNOSIS — Z01818 Encounter for other preprocedural examination: Secondary | ICD-10-CM

## 2015-04-18 DIAGNOSIS — R5383 Other fatigue: Secondary | ICD-10-CM | POA: Diagnosis not present

## 2015-04-18 DIAGNOSIS — I4892 Unspecified atrial flutter: Secondary | ICD-10-CM

## 2015-04-18 MED ORDER — AMIODARONE HCL 200 MG PO TABS: 200.0000 mg | ORAL_TABLET | Freq: Two times a day (BID) | ORAL | Status: DC

## 2015-04-18 MED ORDER — METOPROLOL SUCCINATE ER 25 MG PO TB24: 12.5000 mg | ORAL_TABLET | Freq: Every day | ORAL | Status: DC

## 2015-04-18 NOTE — Patient Instructions (Signed)
Your physician has recommended you make the following change in your medication:   1.) the amiodarone has been changed to 1 tablet twice a day.  2.) the metoprolol has been changed to 1/2 tablet daily. ( 12.5 mg )  Your physician has recommended that you have a Cardioversion (DCCV). Electrical Cardioversion uses a jolt of electricity to your heart either through paddles or wired patches attached to your chest. This is a controlled, usually prescheduled, procedure. Defibrillation is done under light anesthesia in the hospital, and you usually go home the day of the procedure. This is done to get your heart back into a normal rhythm. You are not awake for the procedure. Please see the instruction sheet given to you today. This will be scheduled the week of March 27th on Tuesday, Thursday or Friday.  Your physician recommends that you return for lab work and chest xray prior to procedure. You have been provided with lab slips. Nothing needed for CXR.  Your physician recommends that you schedule a follow-up appointment in: 2 weeks post hospital. This will be with a APP or extender and should be scheduled when leaving the hospital.

## 2015-04-19 ENCOUNTER — Other Ambulatory Visit: Payer: Self-pay

## 2015-04-19 MED ORDER — LINAGLIPTIN 5 MG PO TABS: 5.0000 mg | ORAL_TABLET | Freq: Every day | ORAL | Status: DC

## 2015-04-19 NOTE — Telephone Encounter (Signed)
Refill request received from [facility] asking for a 90 day supply of the medication be filled instead of 30 day supply.   Prescription for 90 day supply was sent to pharmacy.

## 2015-04-20 ENCOUNTER — Ambulatory Visit
Admission: RE | Admit: 2015-04-20 | Discharge: 2015-04-20 | Disposition: A | Discharge status: Home / Self Care | Payer: Commercial Managed Care - HMO | Source: Ambulatory Visit | Attending: Cardiovascular Disease | Admitting: Cardiovascular Disease

## 2015-04-20 ENCOUNTER — Encounter: Payer: Self-pay | Admitting: Cardiovascular Disease

## 2015-04-20 DIAGNOSIS — Z01818 Encounter for other preprocedural examination: Secondary | ICD-10-CM

## 2015-04-20 DIAGNOSIS — I4891 Unspecified atrial fibrillation: Secondary | ICD-10-CM | POA: Diagnosis not present

## 2015-04-20 DIAGNOSIS — I4892 Unspecified atrial flutter: Secondary | ICD-10-CM

## 2015-04-20 NOTE — Progress Notes (Signed)
Patient ID: CELESTER LECH, male   DOB: 1940-11-12, 75 y.o.   MRN: [id number]     HPI:   Mr. Izaiah Tabb is a 75 year old African American male who has a history of severe COPD, diabetes mellitus, chronic kidney disease, hypertension, obstructive sleep apnea and recently diagnosed atrial flutter.  He presents for 77-monthfollow-up evaluation.    Mr. WUsreyhas a history of lower extremity venous insufficiency. A lower extremity venous Doppler study  did not show evidence for thrombus or thrombophlebitis but he had bilateral deep venous insufficiency within the femoral and popliteal veins. No venous insufficiency was noted in the right and left greater saphenous vein or small saphenous vein. A 2-D echo Doppler study on 12/17/2012 demonstrated normal systolic function with an ejection fraction of 55-60% as well as normal diastolic function. He had mild biatrial enlargement and mild dilatation of his right ventricle. There is mild pulmonary hypertension with a PA pressure 39 mm.  He has a history of obstructive sleep apnea.  After not having seen him in over 2 years when I recently saw him he was only using his CPAP at most 30% of the time.  He needed new supplies.  He admitted to experiencing some shortness of breath with activity.  He states he recently had a bad cold.  He continues to note leg swelling.  He is unaware of any rhythm issues.  He has a history of significant obesity.  The last several weeks he has been using a diet supplement Hydroxycut, of which there is significant amount of coffee extract in this preparation.   When I saw him, his ECG suggested atrial flutter with variable block.  At that time, I advised he discontinue his over-the-counter supplement.  With his elevated [id number]  score, I recommend initiation of anticoagulation.  Since he had had renal insufficiency with creatinine in April 2016 at1.72 and creatinine in July at 1.48,  I initially started him on Xarelto 15 mg for  anticoagulation.  He underwent an echo Doppler study which revealed an EF of 60-65%.  His left atrium was only at the upper limits of normal in size.  However, his right atrium was severely dilated.  There was mild tricuspid regurgitation.  I also initiated metoprolol, initially at 12.5 mg since his resting pulse was already in the 60s.  I also discussed the importance of improved CPAP compliance.  Prior to my last office visit last saw him, he was using CPAP with 100% compliance. Blood work  showed stable hemoglobin and hematocrit.  His current renal function continued to improve and was now 1.36.  Lipid studies revealed a total cholesterol 174, triglycerides 63, HDL 55, and LDL 106.  He had a normal magnesium.  TSH was 3.53.  He states he has not taken Xarelto for at least 10 days when he began to notice some flecks of blood in stool.  He denied any overt GI bleeding.   When I saw him in November 2016.  I discontinued his Xarelto and changed him to Eliquis 5 mg twice a day.  His renal function had improved.  I recommended that he discontinue aspirin.  I entered amiodarone at 2 mg daily in attempt to possibly pharmacologically cardiovert him.  He presents now for follow-up evaluation.  Presently, he denies chest pain or shortness of breath.  He denies PND, orthopnea.  He continues to use CPAP with compliance.   Past Medical History  Diagnosis Date  . Benign essential hypertension   .  Gout attack 09/2012  . Allergy   . Asthma   . Sinusitis, maxillary, chronic   . Type II or unspecified type diabetes mellitus without mention of complication, not stated as uncontrolled   . Atrophic gastritis   . COPD (chronic obstructive pulmonary disease) ([place])   . Diabetes ([place])   . GERD (gastroesophageal reflux disease)     Past Surgical History  Procedure Laterality Date  . Hernia repair  1980  . Colonoscopy  2003    No Known Allergies  Current Outpatient Prescriptions  Medication Sig Dispense Refill    . albuterol (PROVENTIL HFA;VENTOLIN HFA) 108 (90 BASE) MCG/ACT inhaler Inhale 2 puffs every 6 hours as needed for wheezing 1 Inhaler 0  . albuterol (PROVENTIL) (2.5 MG/3ML) 0.083% nebulizer solution INHALE CONTENTS OF 1 VIAL IN NEBULIZER EVERY 4 HOURS AS NEEDED FOR WHEEZING AND ASTHMA 75 mL 1  . allopurinol (ZYLOPRIM) 300 MG tablet Take 300 mg by mouth daily.    . AMBULATORY NON FORMULARY MEDICATION CPAP Supplies Dx:G47.33 1 each 0  . amiodarone (PACERONE) 200 MG tablet Take 1 tablet (200 mg total) by mouth 2 (two) times daily. 60 tablet 6  . apixaban (ELIQUIS) 5 MG TABS tablet Take 1 tablet (5 mg total) by mouth 2 (two) times daily. 60 tablet 6  . colchicine 0.6 MG tablet Take 1 tablet (0.6 mg total) by mouth daily as needed (gout flare-up). 30 tablet 2  . methylPREDNISolone (MEDROL DOSEPAK) 4 MG TBPK tablet Take as directed 21 tablet 0  . metoprolol succinate (TOPROL XL) 25 MG 24 hr tablet Take 0.5 tablets (12.5 mg total) by mouth daily. 30 tablet 6  . montelukast (SINGULAIR) 10 MG tablet TAKE ONE TABLET BY MOUTH AT BEDTIME AS NEEDED FOR ALLERGIES AND ASTHMA 30 tablet 5  . Multiple Vitamins-Minerals (CENTRUM SILVER ADULT 50+ PO) Take 1 tablet by mouth daily.    . mupirocin ointment (BACTROBAN) 2 % Apply 1 application topically 2 (two) times daily. 22 g 0  . oxyCODONE-acetaminophen (PERCOCET) 5-325 MG tablet Take 1-2 tablets by mouth every 4 (four) hours as needed for moderate pain or severe pain. 40 tablet 0  . pantoprazole (PROTONIX) 40 MG tablet TAKE 1 TABLET BY MOUTH EVERY DAY 90 tablet 1  . pravastatin (PRAVACHOL) 20 MG tablet Take 1 tablet (20 mg total) by mouth daily with supper. 30 tablet 3  . torsemide (DEMADEX) 20 MG tablet TAKE 1 TABLET BY MOUTH TWICE A DAY FOR SWELLING (HEART FAILURE) 60 tablet 5  . linagliptin (TRADJENTA) 5 MG TABS tablet Take 1 tablet (5 mg total) by mouth daily. 90 tablet 1   No current facility-administered medications for this visit.    Social history is  notable in that he is married for 55 years. He has 3 children 8 grandchildren 6 great-grandchildren. He is retired from [employer]. He completed ninth grade education. He smoked until 1999.  Family History  Problem Relation Age of Onset  . Stroke Mother   . Cancer Sister   . Diabetes Sister   . COPD Sister   . COPD Sister     ROS General: Negative; No fevers, chills, or night sweats; no 6S with weight loss HEENT: Negative; No changes in vision or hearing, sinus congestion, difficulty swallowing Pulmonary: Positive for severe COPD.  Positive for shortness of breath. Cardiovascular: Negative; No chest pain, presyncope, syncope, palpitations Positive for leg swelling GI: Negative; No nausea, vomiting, diarrhea, or abdominal pain GU: Negative; No dysuria, hematuria, or difficulty voiding  Musculoskeletal: Negative; no myalgias, joint pain, or weakness Rheumatologic: Positive for gout  Hematologic/Oncology: Negative; no easy bruising, bleeding Endocrine: Positive for diabetes mellitus Neuro: Negative; no changes in balance, headaches Skin: Negative; No rashes or skin lesions Psychiatric: Negative; No behavioral problems, depression Sleep: Positive for obstructive sleep apnea; now with improved compliance.  No snoring, daytime sleepiness, hypersomnolence, bruxism, restless legs, hypnogognic hallucinations, no cataplexy Other comprehensive 14 point system review is negative.   PE BP 110/66 mmHg  Pulse 92  Ht 5' 7" (1.702 m)  Wt 243 lb 14.4 oz (110.632 kg)  BMI 38.19 kg/m2    Wt Readings from Last 3 Encounters:  04/18/15 243 lb 14.4 oz (110.632 kg)  04/06/15 246 lb (111.585 kg)  02/28/15 254 lb 12.8 oz (115.577 kg)   General: Alert, oriented, no distress.  Skin: normal turgor, no rashes HEENT: Normocephalic, atraumatic. Pupils round and reactive; sclera anicteric; Fundi mild arteriolar narrowing. No hemorrhages or exudates. Nose without nasal septal  hypertrophy Mouth/Parynx benign; Mallinpatti scale 4 Neck: Thick neck No JVD, no carotid bruits Lungs: clear to ausculatation and percussion; no wheezing or rales Heart: Mildly irregular rhythm; s1 s2 normal a 1/6 systolic murmur. No S3 or S4 gallop. No rubs, thrills or heaves. Abdomen: Diastases recti; soft, nontender; no hepatosplenomehaly, BS+; abdominal aorta nontender and not dilated by palpation. Pulses 2+ Extremities: Previous tense lower extremity edema has significantly improved and is now trace.  lower extremity edema. no clubbinbg cyanosis, Homan's sign negative  Neurologic: grossly nonfocal; radial nerves grossly intact  Psychologic: Normal mood  ECG (independently read by me): Atrial fibrillation at 92 bpm with occasional unifocal PVCs  November 2016 ECG (independently read by me): Atrial fibrillation at 80 bpm with 1 PVC.  11/10/2014 ECG (independently read by me): Atrial flutter with variable block.  Left axis deviation.  Nonspecific T changes.  QTc interval 458 ms.  January 2015 ECG (independently read by me): Sinus rhythm with frequent atrial premature conk contractions as well as occasional unifocal premature ventricular contractions. Nonspecific ST and T wave changes.  Prior ECG of 11/25/2012: Sinus rhythm with first-degree AV block with occasional PVC. The patient is entirely asymptomatic with reference to this ectopy.  LABS: BMP Latest Ref Rng 02/25/2015 12/13/2014 11/19/2014  Glucose 65 - 99 mg/dL 92 84 97  BUN 8 - 27 mg/dL _0 Creatinine 0.76 - 1.27 mg/dL 1.46(H) 1.36(H) 1.53(H)  BUN/Creat Ratio 10 - 22 11 - 16  Sodium 134 - 144 mmol/L 144 142 142  Potassium 3.5 - 5.2 mmol/L 4.0 4.2 4.3  Chloride 96 - 106 mmol/L 97 98 97  CO2 18 - 29 mmol/L 32(H) 31 30(H)  Calcium 8.6 - 10.2 mg/dL 8.4(L) 9.0 9.7   Hepatic Function Latest Ref Rng 02/25/2015 12/13/2014 08/16/2014  Total Protein 6.0 - 8.5 g/dL 6.9 7.3 7.2  Albumin 3.5 - 4.8 g/dL 4.1 3.8 4.3  AST 0 - 40 IU/L 27  31 56(H)  ALT 0 - 44 IU/L _1 Alk Phosphatase 39 - 117 IU/L 47 42 55  Total Bilirubin 0.0 - 1.2 mg/dL 0.5 0.5 0.8   CBC Latest Ref Rng 02/25/2015 12/13/2014 08/16/2014  WBC 3.4 - 10.8 x10E3/uL 4.5 4.3 3.8  Hemoglobin 13.0 - 17.0 g/dL - 13.5 -  Hematocrit 37.5 - 51.0 % 39.8 41.3 43.9  Platelets 150 - 379 x10E3/uL 201 236 188   Lab Results  Component Value Date   MCV 92 02/25/2015   MCV 90.2 12/13/2014   MCV  90 08/16/2014   Lab Results  Component Value Date   TSH 3.535 12/13/2014   Lab Results  Component Value Date   HGBA1C 6.1* 02/25/2015   Lipid Panel     Component Value Date/Time   CHOL 171 02/25/2015 0949   CHOL 174 12/13/2014 0856   TRIG 55 02/25/2015 0949   HDL 65 02/25/2015 0949   HDL 55 12/13/2014 0856   CHOLHDL 2.6 02/25/2015 0949   CHOLHDL 3.2 12/13/2014 0856   VLDL 13 12/13/2014 0856   LDLCALC 95 02/25/2015 0949   LDLCALC 106 12/13/2014 0856      RADIOLOGY: US Abdomen Complete  11/25/2012   CLINICAL DATA:  Abdominal distention  EXAM: ULTRASOUND ABDOMEN COMPLETE  COMPARISON:  None.  FINDINGS: Gallbladder  No gallstones or wall thickening visualized. No sonographic Murphy sign noted.  Common bile duct  Diameter: 4.2 mm  Liver  No focal lesion identified. Within normal limits in parenchymal echogenicity.  IVC  No abnormality visualized.  Pancreas  Visualized portion unremarkable.  Spleen  Size and appearance within normal limits.  Right Kidney  Length: 9.8 cm in length. A 1.8 cm cyst is noted in the parapelvic region.  Left Kidney  Length: 10.7 cm in length. Echogenicity within normal limits. No mass or hydronephrosis visualized.  Abdominal aorta  No aneurysm visualized.  IMPRESSION: Right renal cyst. No other focal abnormality is noted.   Electronically Signed   By: Inez Catalina M.D.   On: 11/25/2012 08:12     ASSESSMENT AND PLAN: Mr. Dawn Kiper is a 75 year old African American male with a long history of prior tobacco use but quit approximately 17 years  ago. He has COPD. A prior echo Doppler study demonstrated normal systolic and diastolic function with mild biatrial enlargement as well as mild dilatation of his right ventricle and mild pulmonary hypertension with an estimated PA pressure 39 mm.  His most recent echo shows ejection fraction at 60-65% with moderate left ventricular hypertrophy,  trivial MR, upper normal LA size but severely dilated, RA with mild tricuspid regurgitation.  There is mild pulmonary hypertension with a PA pressure at 38 mm.  When I saw him in November 2016  after not having seen him in over 2 years, he was in atrial flutter with variable block.  He was using an over-the-counter supplement which had a significant amount of caffeine.  When I saw him one month later, he was still in atrial fibrillation.  He is now tolerating Eliquis well and is on 5 mg twice a day dosing.  He continues to be in atrial fibrillation.  I recommended further titration of his amiodarone to 2 a milligrams twice a day.  He will reduce his metoprolol succinate to 12.5 mg daily.  I reviewed recent laboratory.  In 2 weeks, I am scheduling him for a outpatient cardioversion in attempt to restore sinus rhythm.  Lab laboratory a chest x-ray will be obtained prior to his cardioversion procedure.  I discussed the risk, benefits of the procedure with him.  Time spent: 25 minutes  Troy Sine, MD, [facility] 04/20/2015 11:11 PM

## 2015-04-21 LAB — COMPREHENSIVE METABOLIC PANEL
ALT: 22 U/L (ref 9–46)
AST: 26 U/L (ref 10–35)
Albumin: 4 g/dL (ref 3.6–5.1)
Alkaline Phosphatase: 48 U/L (ref 40–115)
BUN: 16 mg/dL (ref 7–25)
CHLORIDE: 102 mmol/L (ref 98–110)
CO2: 33 mmol/L — AB (ref 20–31)
Calcium: 9.1 mg/dL (ref 8.6–10.3)
Creat: 1.55 mg/dL — ABNORMAL HIGH (ref 0.70–1.18)
GLUCOSE: 105 mg/dL — AB (ref 65–99)
POTASSIUM: 4.4 mmol/L (ref 3.5–5.3)
SODIUM: 141 mmol/L (ref 135–146)
Total Bilirubin: 0.7 mg/dL (ref 0.2–1.2)
Total Protein: 7 g/dL (ref 6.1–8.1)

## 2015-04-21 LAB — CBC
HEMATOCRIT: 44.6 % (ref 39.0–52.0)
Hemoglobin: 13.8 g/dL (ref 13.0–17.0)
MCH: 28.6 pg (ref 26.0–34.0)
MCHC: 30.9 g/dL (ref 30.0–36.0)
MCV: 92.3 fL (ref 78.0–100.0)
MPV: 11.3 fL (ref 8.6–12.4)
PLATELETS: 208 10*3/uL (ref 150–400)
RBC: 4.83 MIL/uL (ref 4.22–5.81)
RDW: 14.6 % (ref 11.5–15.5)
WBC: 4.2 10*3/uL (ref 4.0–10.5)

## 2015-04-21 LAB — APTT: APTT: 32 s (ref 24–37)

## 2015-04-21 LAB — PROTIME-INR
INR: 1.12 (ref <1.50)
Prothrombin Time: 14.5 seconds (ref 11.6–15.2)

## 2015-04-21 LAB — TSH: TSH: 10.28 m[IU]/L — AB (ref 0.40–4.50)

## 2015-04-27 ENCOUNTER — Encounter: Payer: Self-pay | Admitting: *Deleted

## 2015-04-28 ENCOUNTER — Encounter: Payer: Self-pay | Admitting: Internal Medicine

## 2015-05-04 ENCOUNTER — Encounter: Payer: Self-pay | Admitting: Internal Medicine

## 2015-05-04 ENCOUNTER — Ambulatory Visit (INDEPENDENT_AMBULATORY_CARE_PROVIDER_SITE_OTHER): Payer: Medicare HMO | Admitting: Internal Medicine

## 2015-05-04 VITALS — BP 136/82 | HR 69 | Temp 97.9°F | Ht 67.0 in | Wt 248.6 lb

## 2015-05-04 DIAGNOSIS — E1122 Type 2 diabetes mellitus with diabetic chronic kidney disease: Secondary | ICD-10-CM

## 2015-05-04 DIAGNOSIS — I481 Persistent atrial fibrillation: Secondary | ICD-10-CM

## 2015-05-04 DIAGNOSIS — N183 Chronic kidney disease, stage 3 unspecified: Secondary | ICD-10-CM

## 2015-05-04 DIAGNOSIS — L03031 Cellulitis of right toe: Secondary | ICD-10-CM

## 2015-05-04 DIAGNOSIS — I4819 Other persistent atrial fibrillation: Secondary | ICD-10-CM

## 2015-05-04 DIAGNOSIS — M1A9XX0 Chronic gout, unspecified, without tophus (tophi): Secondary | ICD-10-CM

## 2015-05-04 MED ORDER — DOXYCYCLINE MONOHYDRATE 100 MG PO CAPS: 100.0000 mg | ORAL_CAPSULE | Freq: Two times a day (BID) | ORAL | Status: DC

## 2015-05-04 MED ORDER — ALLOPURINOL 300 MG PO TABS: 300.0000 mg | ORAL_TABLET | Freq: Every day | ORAL | Status: DC

## 2015-05-04 NOTE — Progress Notes (Signed)
Patient ID: Omar Lambert, male   DOB: 25-Dec-1940, 75 y.o.   MRN: [id number]    Location:    [facility]   Place of Service:  OFFICE   Chief Complaint  Patient presents with  . Acute Visit    Complains of spot on Right Big Toe    HPI:  75 yo male seen today for right 2nd toe infection. He reports 1-2 week hx painful d/c from toe. tx with home remedies. No known injury or insect bites. He is also c/a right medial leg sore x 1-2 mos which was tx in the past. Not painful at this time. No f/c. He does not check BS at home. A1c 6.1% in Jan 2017. He has PAF and takes amiodarone and eliquis. He has CKD and hx gout. Needs med RF allopurinol  Past Medical History  Diagnosis Date  . Benign essential hypertension   . Gout attack 09/2012  . Allergy   . Asthma   . Sinusitis, maxillary, chronic   . Type II or unspecified type diabetes mellitus without mention of complication, not stated as uncontrolled   . Atrophic gastritis   . COPD (chronic obstructive pulmonary disease) ([place])   . Diabetes ([place])   . GERD (gastroesophageal reflux disease)     Past Surgical History  Procedure Laterality Date  . Hernia repair  1980  . Colonoscopy  2003    Patient Care Team: Gayland Curry, DO as PCP - General (Geriatric Medicine) Troy Sine, MD as Consulting Physician (Cardiology) Jerene Bears, MD as Consulting Physician (Gastroenterology) Tanda Rockers, MD as Consulting Physician (Pulmonary Disease) Clent Jacks, MD as Consulting Physician (Ophthalmology)  Social History   Social History  . Marital Status: Married    Spouse Name: N/A  . Number of Children: 3  . Years of Education: N/A   Occupational History  . Retired    Social History Main Topics  . Smoking status: Former Smoker -- 1.00 packs/day for 20 years    Types: Cigarettes    Quit date: 06/17/1997  . Smokeless tobacco: Never Used  . Alcohol Use: No  . Drug Use: No  . Sexual Activity: Not on file   Other Topics Concern  . Not on  file   Social History Narrative   Lives in 1 story apt with his wife, Omar Lambert.  Sometimes exercises.  Drinks coffee.       reports that he quit smoking about 17 years ago. His smoking use included Cigarettes. He has a 20 pack-year smoking history. He has never used smokeless tobacco. He reports that he does not drink alcohol or use illicit drugs.  No Known Allergies  Medications: Patient's Medications  New Prescriptions   No medications on file  Previous Medications   ALBUTEROL (PROVENTIL HFA;VENTOLIN HFA) 108 (90 BASE) MCG/ACT INHALER    Inhale 2 puffs every 6 hours as needed for wheezing   ALBUTEROL (PROVENTIL) (2.5 MG/3ML) 0.083% NEBULIZER SOLUTION    INHALE CONTENTS OF 1 VIAL IN NEBULIZER EVERY 4 HOURS AS NEEDED FOR WHEEZING AND ASTHMA   ALLOPURINOL (ZYLOPRIM) 300 MG TABLET    Take 300 mg by mouth daily.   AMBULATORY NON FORMULARY MEDICATION    CPAP Supplies Dx:G47.33   AMIODARONE (PACERONE) 200 MG TABLET    Take 1 tablet (200 mg total) by mouth 2 (two) times daily.   APIXABAN (ELIQUIS) 5 MG TABS TABLET    Take 1 tablet (5 mg total) by mouth 2 (two) times daily.  COLCHICINE 0.6 MG TABLET    Take 1 tablet (0.6 mg total) by mouth daily as needed (gout flare-up).   LINAGLIPTIN (TRADJENTA) 5 MG TABS TABLET    Take 1 tablet (5 mg total) by mouth daily.   METHYLPREDNISOLONE (MEDROL DOSEPAK) 4 MG TBPK TABLET    Take as directed   METOPROLOL SUCCINATE (TOPROL XL) 25 MG 24 HR TABLET    Take 0.5 tablets (12.5 mg total) by mouth daily.   MONTELUKAST (SINGULAIR) 10 MG TABLET    TAKE ONE TABLET BY MOUTH AT BEDTIME AS NEEDED FOR ALLERGIES AND ASTHMA   MULTIPLE VITAMINS-MINERALS (CENTRUM SILVER ADULT 50+ PO)    Take 1 tablet by mouth daily.   MUPIROCIN OINTMENT (BACTROBAN) 2 %    Apply 1 application topically 2 (two) times daily.   OXYCODONE-ACETAMINOPHEN (PERCOCET) 5-325 MG TABLET    Take 1-2 tablets by mouth every 4 (four) hours as needed for moderate pain or severe pain.   PANTOPRAZOLE  (PROTONIX) 40 MG TABLET    TAKE 1 TABLET BY MOUTH EVERY DAY   PRAVASTATIN (PRAVACHOL) 20 MG TABLET    Take 1 tablet (20 mg total) by mouth daily with supper.   TORSEMIDE (DEMADEX) 20 MG TABLET    TAKE 1 TABLET BY MOUTH TWICE A DAY FOR SWELLING (HEART FAILURE)  Modified Medications   No medications on file  Discontinued Medications   No medications on file    Review of Systems  Cardiovascular: Positive for leg swelling.  Skin: Positive for rash and wound.  All other systems reviewed and are negative.   Filed Vitals:   05/04/15 0905  BP: 136/82  Pulse: 69  Temp: 97.9 F (36.6 C)  TempSrc: Oral  Height: 5\' 7"  (1.702 m)  Weight: 248 lb 9.6 oz (112.764 kg)   Body mass index is 38.93 kg/(m^2).  Physical Exam  Constitutional: He is oriented to person, place, and time. He appears well-developed and well-nourished.  Cardiovascular:  +1 pitting LE edema b/l. No calf TTP  Musculoskeletal: He exhibits edema and tenderness.  Small and large joint deformities  Neurological: He is alert and oriented to person, place, and time.  Skin: Skin is warm and dry. Rash noted.     Right 2nd toe swelling, redness and scaling with increased warmth to touch, bloody and TTP. Streaking extending into foot. No medial right leg/ankle sore  Psychiatric: He has a normal mood and affect. His behavior is normal. Thought content normal.     Labs reviewed: Office Visit on 04/18/2015  Component Date Value Ref Range Status  . aPTT 04/18/2015 32  24 - 37 seconds Final   Comment: This test is for screening purposes only; it should not be used for therapeutic unfractionated heparin monitoring.  Please refer to Heparin Anti-Xa [id number]).   . WBC 04/18/2015 4.2  4.0 - 10.5 K/uL Final  . RBC 04/18/2015 4.83  4.22 - 5.81 MIL/uL Final  . Hemoglobin 04/18/2015 13.8  13.0 - 17.0 g/dL Final  . HCT 04/18/2015 44.6  39.0 - 52.0 % Final  . MCV 04/18/2015 92.3  78.0 - 100.0 fL Final  . MCH 04/18/2015 28.6  26.0 - 34.0  pg Final  . MCHC 04/18/2015 30.9  30.0 - 36.0 g/dL Final  . RDW 04/18/2015 14.6  11.5 - 15.5 % Final  . Platelets 04/18/2015 208  150 - 400 K/uL Final  . MPV 04/18/2015 11.3  8.6 - 12.4 fL Final  . Sodium 04/18/2015 141  135 - 146 mmol/L Final  .  Potassium 04/18/2015 4.4  3.5 - 5.3 mmol/L Final  . Chloride 04/18/2015 102  98 - 110 mmol/L Final  . CO2 04/18/2015 33* 20 - 31 mmol/L Final  . Glucose, Bld 04/18/2015 105* 65 - 99 mg/dL Final  . BUN 04/18/2015 16  7 - 25 mg/dL Final  . Creat 04/18/2015 1.55* 0.70 - 1.18 mg/dL Final  . Total Bilirubin 04/18/2015 0.7  0.2 - 1.2 mg/dL Final  . Alkaline Phosphatase 04/18/2015 48  40 - 115 U/L Final  . AST 04/18/2015 26  10 - 35 U/L Final  . ALT 04/18/2015 22  9 - 46 U/L Final  . Total Protein 04/18/2015 7.0  6.1 - 8.1 g/dL Final  . Albumin 04/18/2015 4.0  3.6 - 5.1 g/dL Final  . Calcium 04/18/2015 9.1  8.6 - 10.3 mg/dL Final  . TSH 04/18/2015 10.28* 0.40 - 4.50 mIU/L Final  . Prothrombin Time 04/18/2015 14.5  11.6 - 15.2 seconds Final  . INR 04/18/2015 1.12  <1.50 Final   Comment: The INR is of principal utility in following patients on stable doses of oral anticoagulants.  The therapeutic range is generally 2.0 to 3.0, but may be 3.0 to 4.0 in patients with mechanical cardiac valves, recurrent embolisms and antiphospholipid antibodies (including lupus inhibitors).   Office Visit on 04/06/2015  Component Date Value Ref Range Status  . Uric Acid 04/06/2015 11.0* 3.7 - 8.6 mg/dL Final              Therapeutic target for gout patients: <6.0  Admission on 04/01/2015, Discharged on 04/02/2015  Component Date Value Ref Range Status  . Glucose-Capillary 04/02/2015 88  65 - 99 mg/dL Final  . Comment 1 04/02/2015 Notify RN   Final  Office Visit on 02/28/2015  Component Date Value Ref Range Status  . Creatinine, Urine 02/28/2015 249.2  Not Estab. mg/dL Final  . Microalbum.,U,Random 02/28/2015 87.9  Not Estab. ug/mL Final  . MICROALB/CREAT RATIO  02/28/2015 35.3* 0.0 - 30.0 mg/g creat Final  Appointment on 02/25/2015  Component Date Value Ref Range Status  . WBC 02/25/2015 4.5  3.4 - 10.8 x10E3/uL Final  . RBC 02/25/2015 4.32  4.14 - 5.80 x10E6/uL Final  . Hemoglobin 02/25/2015 13.0  12.6 - 17.7 g/dL Final  . Hematocrit 02/25/2015 39.8  37.5 - 51.0 % Final  . MCV 02/25/2015 92  79 - 97 fL Final  . MCH 02/25/2015 30.1  26.6 - 33.0 pg Final  . MCHC 02/25/2015 32.7  31.5 - 35.7 g/dL Final  . RDW 02/25/2015 14.1  12.3 - 15.4 % Final  . Platelets 02/25/2015 201  150 - 379 x10E3/uL Final  . Neutrophils 02/25/2015 58   Final  . Lymphs 02/25/2015 24   Final  . Monocytes 02/25/2015 10   Final  . Eos 02/25/2015 8   Final  . Basos 02/25/2015 0   Final  . Neutrophils Absolute 02/25/2015 2.5  1.4 - 7.0 x10E3/uL Final  . Lymphocytes Absolute 02/25/2015 1.1  0.7 - 3.1 x10E3/uL Final  . Monocytes Absolute 02/25/2015 0.5  0.1 - 0.9 x10E3/uL Final  . EOS (ABSOLUTE) 02/25/2015 0.4  0.0 - 0.4 x10E3/uL Final  . Basophils Absolute 02/25/2015 0.0  0.0 - 0.2 x10E3/uL Final  . Immature Granulocytes 02/25/2015 0   Final  . Immature Grans (Abs) 02/25/2015 0.0  0.0 - 0.1 x10E3/uL Final  . Glucose 02/25/2015 92  65 - 99 mg/dL Final  . BUN 02/25/2015 16  8 - 27 mg/dL Final  . Creatinine,  Ser 02/25/2015 1.46* 0.76 - 1.27 mg/dL Final  . GFR calc non Af Amer 02/25/2015 46* >59 mL/min/1.73 Final  . GFR calc Af Amer 02/25/2015 54* >59 mL/min/1.73 Final  . BUN/Creatinine Ratio 02/25/2015 11  10 - 22 Final  . Sodium 02/25/2015 144  134 - 144 mmol/L Final  . Potassium 02/25/2015 4.0  3.5 - 5.2 mmol/L Final  . Chloride 02/25/2015 97  96 - 106 mmol/L Final  . CO2 02/25/2015 32* 18 - 29 mmol/L Final  . Calcium 02/25/2015 8.4* 8.6 - 10.2 mg/dL Final  . Total Protein 02/25/2015 6.9  6.0 - 8.5 g/dL Final  . Albumin 02/25/2015 4.1  3.5 - 4.8 g/dL Final  . Globulin, Total 02/25/2015 2.8  1.5 - 4.5 g/dL Final  . Albumin/Globulin Ratio 02/25/2015 1.5  1.1 - 2.5 Final    . Bilirubin Total 02/25/2015 0.5  0.0 - 1.2 mg/dL Final  . Alkaline Phosphatase 02/25/2015 47  39 - 117 IU/L Final  . AST 02/25/2015 27  0 - 40 IU/L Final  . ALT 02/25/2015 16  0 - 44 IU/L Final  . Hgb A1c MFr Bld 02/25/2015 6.1* 4.8 - 5.6 % Final   Comment:          Pre-diabetes: 5.7 - 6.4          Diabetes: >6.4          Glycemic control for adults with diabetes: <7.0   . Est. average glucose Bld gHb Est-m* 02/25/2015 128   Final  . Cholesterol, Total 02/25/2015 171  100 - 199 mg/dL Final  . Triglycerides 02/25/2015 55  0 - 149 mg/dL Final  . HDL 02/25/2015 65  >39 mg/dL Final  . VLDL Cholesterol Cal 02/25/2015 11  5 - 40 mg/dL Final  . LDL Calculated 02/25/2015 95  0 - 99 mg/dL Final  . Chol/HDL Ratio 02/25/2015 2.6  0.0 - 5.0 ratio units Final   Comment:                                   T. Chol/HDL Ratio                                             Men  Women                               1/2 Avg.Risk  3.4    3.3                                   Avg.Risk  5.0    4.4                                2X Avg.Risk  9.6    7.1                                3X Avg.Risk 23.4   11.0     Dg Chest 2 View  04/20/2015  CLINICAL DATA:  Preop cardioversion.  Atrial fibrillation EXAM: CHEST  2 VIEW COMPARISON:  12/25/2012 FINDINGS: Cardiomegaly  with mild vascular congestion. No overt edema. No confluent opacities or effusions. No acute bony abnormality. IMPRESSION: Cardiomegaly, vascular congestion. Electronically Signed   By: Rolm Baptise M.D.   On: 04/20/2015 12:01   Dg Wrist Complete Left  04/06/2015  CLINICAL DATA:  Left hand/wrist pain and swelling EXAM: LEFT WRIST - COMPLETE 3+ VIEW COMPARISON:  None. FINDINGS: No fracture or dislocation is seen. Mild degenerative changes of the radiocarpal joint. Mild diffuse soft tissue swelling along the dorsal aspect of the hand/wrist. IMPRESSION: No fracture or dislocation is seen. Mild degenerative changes. Mild diffuse soft tissue swelling.  Electronically Signed   By: Julian Hy M.D.   On: 04/06/2015 10:25   Dg Hand Complete Left  04/06/2015  CLINICAL DATA:  Two week history of soft tissue swelling and pain. No known injury. EXAM: LEFT HAND - COMPLETE 3+ VIEW COMPARISON:  None. FINDINGS: Frontal, oblique, and lateral views were obtained. There is diffuse soft tissue swelling, most pronounced dorsally. There is no demonstrable acute fracture or dislocation. 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 The more distal joint spaces appear unremarkable. No erosive change or bony destruction. IMPRESSION: Diffuse soft tissue swelling. No fracture or dislocation. 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 More distally, joint spaces appear unremarkable. No erosions are appreciable on this study. Electronically Signed   By: Lowella Grip III M.D.   On: 04/06/2015 10:25     Assessment/Plan   ICD-9-CM ICD-10-CM   1. Cellulitis of toe of right foot 681.10 L03.031 doxycycline (MONODOX) 100 MG capsule  2. Chronic gout without tophus, unspecified cause, unspecified site 274.02 [id number] allopurinol (ZYLOPRIM) 300 MG tablet  3. Controlled type 2 diabetes mellitus with stage 3 chronic kidney disease, without long-term current use of insulin (HCC) 250.40 E11.22    585.3 N18.3   4. Persistent atrial fibrillation (HCC) 427.31 I48.1     Recommend right foot warm epsom salt soaks at least 2 times daily  Recommend take OTC probiotic daily while on antibiotic  Continue other medications as ordered  Follow up with Dr Mariea Clonts as scheduled. Return to office if not better in next 3-4 days   Jaxtyn Linville S. Perlie Gold  [facility] [address] [place], [place] [address] [phone] Cell (Monday-Friday 8 AM - 5 PM) [phone] After 5 PM and follow prompts

## 2015-05-04 NOTE — Patient Instructions (Signed)
Recommend right foot warm epsom salt soaks at least 2 times daily  Recommend take OTC probiotic daily while on antibiotic  Continue other medications as ordered  Follow up with Dr Mariea Clonts as scheduled. Return to office if not better in next 3-4 days  Cellulitis Cellulitis is an infection of the skin and the tissue under the skin. The infected area is usually red and tender. This happens most often in the arms and lower legs. HOME CARE   Take your antibiotic medicine as told. Finish the medicine even if you start to feel better.  Keep the infected arm or leg raised (elevated).  Put a warm cloth on the area up to 4 times per day.  Only take medicines as told by your doctor.  Keep all doctor visits as told. GET HELP IF:  You see red streaks on the skin coming from the infected area.  Your red area gets bigger or turns a dark color.  Your bone or joint under the infected area is painful after the skin heals.  Your infection comes back in the same area or different area.  You have a puffy (swollen) bump in the infected area.  You have new symptoms.  You have a fever. GET HELP RIGHT AWAY IF:   You feel very sleepy.  You throw up (vomit) or have watery poop (diarrhea).  You feel sick and have muscle aches and pains.   This information is not intended to replace advice given to you by your health care provider. Make sure you discuss any questions you have with your health care provider.   Document Released: 07/11/2007 Document Revised: 10/13/2014 Document Reviewed: 04/09/2011 Elsevier Interactive Patient Education [employer].

## 2015-05-15 ENCOUNTER — Other Ambulatory Visit: Payer: Self-pay | Admitting: Internal Medicine

## 2015-05-16 ENCOUNTER — Telehealth: Payer: Self-pay | Admitting: *Deleted

## 2015-05-16 DIAGNOSIS — E039 Hypothyroidism, unspecified: Secondary | ICD-10-CM

## 2015-05-16 MED ORDER — LEVOTHYROXINE SODIUM 50 MCG PO CAPS: 1.0000 | ORAL_CAPSULE | Freq: Every day | ORAL | Status: DC

## 2015-05-16 NOTE — Telephone Encounter (Signed)
Spoke with patient and gave lab results and recommendations. Levothyroxine sent to patient's pharmacy. Follow up TSH ordered for 4 weeks.

## 2015-05-16 NOTE — Telephone Encounter (Signed)
-----   Message from Troy Sine, MD sent at 04/25/2015  9:21 AM EDT ----- TSH 10.28 on amiodarone for AF;  Add synthroid at 50 ug daily;  Recheck TSH in 4 weeks.

## 2015-05-18 ENCOUNTER — Ambulatory Visit: Payer: Medicare HMO | Admitting: Internal Medicine

## 2015-05-18 ENCOUNTER — Telehealth: Payer: Self-pay

## 2015-05-18 NOTE — Telephone Encounter (Signed)
Called patient to put him back on today's schedule, Dr. Nyoka Cowden will be in. Patient said that his leg is feeling better, doesn't need to come in, said he would call back to re-schedule. Offered to make appt now, "no I'll call back".

## 2015-05-25 ENCOUNTER — Other Ambulatory Visit: Payer: Commercial Managed Care - HMO

## 2015-05-25 DIAGNOSIS — E785 Hyperlipidemia, unspecified: Secondary | ICD-10-CM | POA: Diagnosis not present

## 2015-05-25 DIAGNOSIS — N1832 Chronic kidney disease, stage 3b: Secondary | ICD-10-CM

## 2015-05-25 DIAGNOSIS — E1122 Type 2 diabetes mellitus with diabetic chronic kidney disease: Secondary | ICD-10-CM

## 2015-05-25 DIAGNOSIS — G4733 Obstructive sleep apnea (adult) (pediatric): Secondary | ICD-10-CM | POA: Diagnosis not present

## 2015-05-25 DIAGNOSIS — N183 Chronic kidney disease, stage 3 unspecified: Secondary | ICD-10-CM

## 2015-05-25 DIAGNOSIS — E1169 Type 2 diabetes mellitus with other specified complication: Secondary | ICD-10-CM | POA: Diagnosis not present

## 2015-05-25 DIAGNOSIS — I10 Essential (primary) hypertension: Secondary | ICD-10-CM | POA: Diagnosis not present

## 2015-05-26 ENCOUNTER — Other Ambulatory Visit: Payer: Self-pay | Admitting: Internal Medicine

## 2015-05-26 LAB — BASIC METABOLIC PANEL
BUN/Creatinine Ratio: 9 — ABNORMAL LOW (ref 10–24)
BUN: 14 mg/dL (ref 8–27)
CO2: 32 mmol/L — ABNORMAL HIGH (ref 18–29)
Calcium: 9 mg/dL (ref 8.6–10.2)
Chloride: 98 mmol/L (ref 96–106)
Creatinine, Ser: 1.59 mg/dL — ABNORMAL HIGH (ref 0.76–1.27)
GFR calc Af Amer: 48 mL/min/{1.73_m2} — ABNORMAL LOW (ref >59)
GFR calc non Af Amer: 42 mL/min/{1.73_m2} — ABNORMAL LOW (ref >59)
Glucose: 124 mg/dL — ABNORMAL HIGH (ref 65–99)
Potassium: 3.8 mmol/L (ref 3.5–5.2)
Sodium: 145 mmol/L — ABNORMAL HIGH (ref 134–144)

## 2015-05-26 LAB — LIPID PANEL
Chol/HDL Ratio: 2.2 ratio units (ref 0.0–5.0)
Cholesterol, Total: 151 mg/dL (ref 100–199)
HDL: 68 mg/dL (ref >39)
LDL Calculated: 68 mg/dL (ref 0–99)
Triglycerides: 76 mg/dL (ref 0–149)
VLDL Cholesterol Cal: 15 mg/dL (ref 5–40)

## 2015-05-26 LAB — HEMOGLOBIN A1C
Est. average glucose Bld gHb Est-mCnc: 137 mg/dL
Hgb A1c MFr Bld: 6.4 % — ABNORMAL HIGH (ref 4.8–5.6)

## 2015-05-30 ENCOUNTER — Ambulatory Visit (INDEPENDENT_AMBULATORY_CARE_PROVIDER_SITE_OTHER): Payer: Commercial Managed Care - HMO | Admitting: Internal Medicine

## 2015-05-30 ENCOUNTER — Encounter: Payer: Self-pay | Admitting: Internal Medicine

## 2015-05-30 VITALS — BP 148/80 | HR 73 | Temp 98.2°F | Ht 67.0 in | Wt 258.0 lb

## 2015-05-30 DIAGNOSIS — M1A9XX0 Chronic gout, unspecified, without tophus (tophi): Secondary | ICD-10-CM | POA: Diagnosis not present

## 2015-05-30 DIAGNOSIS — I10 Essential (primary) hypertension: Secondary | ICD-10-CM | POA: Diagnosis not present

## 2015-05-30 DIAGNOSIS — E785 Hyperlipidemia, unspecified: Secondary | ICD-10-CM | POA: Diagnosis not present

## 2015-05-30 DIAGNOSIS — E1169 Type 2 diabetes mellitus with other specified complication: Secondary | ICD-10-CM | POA: Diagnosis not present

## 2015-05-30 DIAGNOSIS — E032 Hypothyroidism due to medicaments and other exogenous substances: Secondary | ICD-10-CM | POA: Diagnosis not present

## 2015-05-30 DIAGNOSIS — G4733 Obstructive sleep apnea (adult) (pediatric): Secondary | ICD-10-CM | POA: Diagnosis not present

## 2015-05-30 DIAGNOSIS — Z9989 Dependence on other enabling machines and devices: Secondary | ICD-10-CM

## 2015-05-30 DIAGNOSIS — N183 Chronic kidney disease, stage 3 unspecified: Secondary | ICD-10-CM

## 2015-05-30 DIAGNOSIS — I4819 Other persistent atrial fibrillation: Secondary | ICD-10-CM

## 2015-05-30 DIAGNOSIS — L03031 Cellulitis of right toe: Secondary | ICD-10-CM

## 2015-05-30 DIAGNOSIS — E1122 Type 2 diabetes mellitus with diabetic chronic kidney disease: Secondary | ICD-10-CM | POA: Diagnosis not present

## 2015-05-30 DIAGNOSIS — I481 Persistent atrial fibrillation: Secondary | ICD-10-CM

## 2015-05-30 MED ORDER — FLUTICASONE PROPIONATE 50 MCG/ACT NA SUSP: 2.0000 | Freq: Every day | NASAL | Status: DC

## 2015-05-30 NOTE — Patient Instructions (Addendum)
Please take the metoprolol 12.5mg  daily for your blood pressure and pulse rate.  Restart exercise program with walking at least 20 minutes 5 days per week and using your free weights.

## 2015-05-30 NOTE — Progress Notes (Signed)
Patient ID: Omar Lambert, male   DOB: 06/26/1940, 75 y.o.   MRN: [id number]   Location:  [facility] clinic Provider:  Lurene Robley L. Mariea Lambert, D.O., C.M.D.  Goals of Care:  Advanced Directives 04/06/2015  Does patient have an advance directive? No  Would patient like information on creating an advanced directive? No - patient declined information   Chief Complaint  Patient presents with  . Medical Management of Chronic Issues    3 mth follow-up, Omar Lambert wife    HPI: Patient is a 75 y.o. male seen today for medical management of chronic diseases.    Had cellulitis of his toe on his right foot.  Dr. Eulas Lambert saw him.  Area keeps returning per his wife.  Omar Lambert was tending to it.  Was spreading among toes and draining.  She gave him doxycycline for 10 days.  She also had him use epsom salts.    Not taking the metoprolol that Dr. Claiborne Billings prescribed.    He is gaining weight.  He did cut out a lot of foods.  Not walking like he used to.  Since December, he's just sitting, then falls asleep.  Had not been feeling well.    He is wearing his cpap at [facility] for osa.  His wife reports he does take it off sometimes, but he says he does put it back on.  No flares of his gout.  Lipids improving, but glucose worsening as he's been sedentary.  Past Medical History  Diagnosis Date  . Benign essential hypertension   . Gout attack 09/2012  . Allergy   . Asthma   . Sinusitis, maxillary, chronic   . Type II or unspecified type diabetes mellitus without mention of complication, not stated as uncontrolled   . Atrophic gastritis   . COPD (chronic obstructive pulmonary disease) ([place])   . Diabetes ([place])   . GERD (gastroesophageal reflux disease)     Past Surgical History  Procedure Laterality Date  . Hernia repair  1980  . Colonoscopy  2003    No Known Allergies    Medication List       This list is accurate as of: 05/30/15  8:41 AM.  Always use your most recent med list.               albuterol (2.5  MG/3ML) 0.083% nebulizer solution  Commonly known as:  PROVENTIL  INHALE CONTENTS OF 1 VIAL IN NEBULIZER EVERY 4 HOURS AS NEEDED FOR WHEEZING AND ASTHMA     allopurinol 300 MG tablet  Commonly known as:  ZYLOPRIM  Take 1 tablet (300 mg total) by mouth daily.     amiodarone 200 MG tablet  Commonly known as:  PACERONE  Take 1 tablet (200 mg total) by mouth 2 (two) times daily.     apixaban 5 MG Tabs tablet  Commonly known as:  ELIQUIS  Take 1 tablet (5 mg total) by mouth 2 (two) times daily.     CENTRUM SILVER ADULT 50+ PO  Take 1 tablet by mouth daily.     levothyroxine 50 MCG tablet  Commonly known as:  SYNTHROID, LEVOTHROID  TAKE 1 TABLET BY MOUTH EVERY DAY BEFORE BREAKFAST     linagliptin 5 MG Tabs tablet  Commonly known as:  TRADJENTA  Take 1 tablet (5 mg total) by mouth daily.     metoprolol succinate 25 MG 24 hr tablet  Commonly known as:  TOPROL XL  Take 0.5 tablets (12.5 mg total) by mouth daily.  montelukast 10 MG tablet  Commonly known as:  SINGULAIR  TAKE ONE TABLET BY MOUTH AT BEDTIME AS NEEDED FOR ALLERGIES AND ASTHMA     pravastatin 20 MG tablet  Commonly known as:  PRAVACHOL  TAKE 1 TABLET BY MOUTH EVERY DAY WITH SUPPER     torsemide 20 MG tablet  Commonly known as:  DEMADEX  TAKE 1 TABLET BY MOUTH TWICE A DAY FOR SWELLING (HEART FAILURE)        Review of Systems:  Review of Systems  Constitutional: Positive for malaise/fatigue. Negative for fever, chills and weight loss.       Weight up  HENT: Positive for congestion. Negative for sore throat.   Eyes: Negative for blurred vision.       Glasses  Respiratory: Positive for shortness of breath and wheezing. Negative for cough.   Cardiovascular: Negative for chest pain, palpitations and leg swelling.       Edema is stable  Gastrointestinal: Negative for abdominal pain.  Genitourinary: Negative for dysuria.  Musculoskeletal: Negative for falls.  Skin: Negative for rash.  Neurological: Negative  for dizziness, loss of consciousness, weakness and headaches.  Endo/Heme/Allergies: Positive for environmental allergies. Does not bruise/bleed easily.  Psychiatric/Behavioral: Negative for depression and memory loss.    Health Maintenance  Topic Date Due  . ZOSTAVAX  05/28/1999  . FOOT EXAM  05/20/2015  . INFLUENZA VACCINE  09/06/2015  . OPHTHALMOLOGY EXAM  09/07/2015  . HEMOGLOBIN A1C  11/24/2015  . URINE MICROALBUMIN  02/28/2016  . TETANUS/TDAP  02/05/2021  . PNA vac Low Risk Adult  Completed    Physical Exam: Filed Vitals:   05/30/15 0820  BP: 148/80  Pulse: 73  Temp: 98.2 F (36.8 C)  TempSrc: Oral  Height: 5\' 7"  (1.702 m)  Weight: 258 lb (117.028 kg)  SpO2: 93%   Body mass index is 40.4 kg/(m^2). Physical Exam  Constitutional: He is oriented to person, place, and time. He appears well-developed and well-nourished. No distress.  Cardiovascular: Intact distal pulses.   irreg irreg  Pulmonary/Chest: Effort normal and breath sounds normal. No respiratory distress.  Abdominal: Soft. Bowel sounds are normal. He exhibits no distension. There is no tenderness.  Musculoskeletal: Normal range of motion. He exhibits no edema or tenderness.  Neurological: He is alert and oriented to person, place, and time.  Skin: Skin is warm and dry.  Ulcers on right medial foot and right second toe resolved  Psychiatric: He has a normal mood and affect.    Labs reviewed: Basic Metabolic Panel:  Recent Labs  12/13/14 0856 02/25/15 0949 04/18/15 1010 05/25/15 0950  NA 142 144 141 145*  K 4.2 4.0 4.4 3.8  CL 98 97 102 98  CO2 31 32* 33* 32*  GLUCOSE 84 92 105* 124*  BUN 24 16 16 14   CREATININE 1.36* 1.46* 1.55* 1.59*  CALCIUM 9.0 8.4* 9.1 9.0  MG 1.8  --   --   --   TSH 3.535  --  10.28*  --    Liver Function Tests:  Recent Labs  12/13/14 0856 02/25/15 0949 04/18/15 1010  AST 31 27 26   ALT 16 16 22   ALKPHOS 42 47 48  BILITOT 0.5 0.5 0.7  PROT 7.3 6.9 7.0  ALBUMIN  3.8 4.1 4.0   No results for input(s): LIPASE, AMYLASE in the last 8760 hours. No results for input(s): AMMONIA in the last 8760 hours. CBC:  Recent Labs  08/16/14 0853 12/13/14 0856 02/25/15 0949 04/18/15 1010  WBC 3.8 4.3 4.5 4.2  NEUTROABS 2.4  --  2.5  --   HGB  --  13.5  --  13.8  HCT 43.9 41.3 39.8 44.6  MCV 90 90.2 92 92.3  PLT 188 236 201 208   Lipid Panel:  Recent Labs  12/13/14 0856 02/25/15 0949 05/25/15 0950  CHOL 174 171 151  HDL 55 65 68  LDLCALC 106 95 68  TRIG 63 55 76  CHOLHDL 3.2 2.6 2.2   Lab Results  Component Value Date   HGBA1C 6.4* 05/25/2015    Assessment/Plan 1. Controlled type 2 diabetes mellitus with stage 3 chronic kidney disease, without long-term current use of insulin (HCC) - control worsening w/o exercise so counseled on that today, is doing pretty well with dietary changes - Microalbumin/Creatinine Ratio, Urine done today - CBC with Differential/Platelet; Future - Comprehensive metabolic panel; Future - Hemoglobin A1c; Future  2. Cellulitis of toe of right foot -resolved after doxy and epsom soaks  -advised to call immediately for any skin openings on feet or signs of infection  3. Chronic gout without tophus, unspecified cause, unspecified site -cont allopurinol  4. OSA on CPAP -cont use of cpap nightly all night  5. Hyperlipidemia associated with type 2 diabetes mellitus ([place]) - cont pravachol--lipids improving - Lipid panel; Future  6. Essential hypertension, benign -bp elevated today b/c he has not taken any meds today even the thyroid pill  -also has not begun the metoprolol 12.5 so counseled on this today -also ate at [employer] for his bday yesterday  7. Persistent atrial fibrillation (HCC) -cont amiodarone and start the metoprolol 12.5mg  daily that Dr. Claiborne Billings ordered -cardiology is monitoring for amiodarone toxicity -pt reports they opted against cardioversion -cont eliquis 5mg  po bid as blood thinner--his  renal function is poor, but he's under 80  8. Hypothyroidism due to medication -new problem due to amiodarone and was started on synthroid by cardiology--does have one f/u tsh ordered - TSH; Future  9. Morbid obesity due to excess calories ([place]) -encouraged him to get back to walking and lifting weights  Labs/tests ordered:  @ORDERS @ Next appt:  Visit date not found   Arlow Spiers L. Avianna Moynahan, D.O. [facility] [address], [place] [address] Cell Phone (Mon-Fri 8am-5pm):  [phone] On Call:  [phone] & follow prompts after 5pm & weekends Office Phone:  [phone] Office Fax:  [phone]

## 2015-05-31 LAB — MICROALBUMIN / CREATININE URINE RATIO
Creatinine, Urine: 108.6 mg/dL
MICROALB/CREAT RATIO: 171.9 mg/g creat — ABNORMAL HIGH (ref 0.0–30.0)
Microalbumin, Urine: 186.7 ug/mL

## 2015-06-02 ENCOUNTER — Encounter: Payer: Self-pay | Admitting: *Deleted

## 2015-06-16 ENCOUNTER — Telehealth: Payer: Self-pay | Admitting: *Deleted

## 2015-06-16 NOTE — Telephone Encounter (Signed)
Patient stated that he will drop off the formulary.

## 2015-06-16 NOTE — Telephone Encounter (Signed)
Patient stated that he needs something cheaper than Tradjenta. Stated that it went from $40.00 to over $100.00 and he cannot afford this. Please Advise.

## 2015-06-16 NOTE — Telephone Encounter (Signed)
I will need a copy of his formulary to determine the best choice of medication based on insurance coverage. They should have mailed him this.

## 2015-06-26 ENCOUNTER — Other Ambulatory Visit: Payer: Self-pay | Admitting: Internal Medicine

## 2015-07-07 DIAGNOSIS — E039 Hypothyroidism, unspecified: Secondary | ICD-10-CM | POA: Diagnosis not present

## 2015-07-08 LAB — TSH: TSH: 40.4 m[IU]/L — ABNORMAL HIGH (ref 0.40–4.50)

## 2015-07-09 ENCOUNTER — Other Ambulatory Visit: Payer: Self-pay | Admitting: Cardiovascular Disease

## 2015-07-13 ENCOUNTER — Ambulatory Visit (INDEPENDENT_AMBULATORY_CARE_PROVIDER_SITE_OTHER): Payer: Commercial Managed Care - HMO | Admitting: Internal Medicine

## 2015-07-13 ENCOUNTER — Encounter: Payer: Self-pay | Admitting: Internal Medicine

## 2015-07-13 VITALS — BP 144/82 | HR 122 | Temp 97.8°F | Ht 67.0 in | Wt 262.6 lb

## 2015-07-13 DIAGNOSIS — I483 Typical atrial flutter: Secondary | ICD-10-CM | POA: Diagnosis not present

## 2015-07-13 DIAGNOSIS — J301 Allergic rhinitis due to pollen: Secondary | ICD-10-CM | POA: Diagnosis not present

## 2015-07-13 DIAGNOSIS — E1121 Type 2 diabetes mellitus with diabetic nephropathy: Secondary | ICD-10-CM | POA: Diagnosis not present

## 2015-07-13 DIAGNOSIS — J0100 Acute maxillary sinusitis, unspecified: Secondary | ICD-10-CM

## 2015-07-13 DIAGNOSIS — J449 Chronic obstructive pulmonary disease, unspecified: Secondary | ICD-10-CM | POA: Diagnosis not present

## 2015-07-13 DIAGNOSIS — R42 Dizziness and giddiness: Secondary | ICD-10-CM

## 2015-07-13 MED ORDER — CEFUROXIME AXETIL 250 MG PO TABS: 250.0000 mg | ORAL_TABLET | Freq: Two times a day (BID) | ORAL | Status: DC

## 2015-07-13 MED ORDER — MECLIZINE HCL 25 MG PO TABS: 25.0000 mg | ORAL_TABLET | Freq: Three times a day (TID) | ORAL | Status: DC | PRN

## 2015-07-13 NOTE — Patient Instructions (Addendum)
Take antibiotic 2 times daily x 10 days  Take probiotic daily while on antibiotic  Will call with referral appts  Continue other medications as ordered  Use saline nasal spray as needed  Follow up as scheduled with Dr Mariea Clonts

## 2015-07-13 NOTE — Progress Notes (Signed)
Patient ID: Omar Lambert, male   DOB: 1941-01-15, 75 y.o.   MRN: [id number]    Location:  [facility] Place of Service: OFFICE  Chief Complaint  Patient presents with  . Acute Visit    patient states he has been having frequent headaches, starts in the morning when he wakes up. he states he think its sinuses    HPI:  75 yo male seen today for HA. He has associated dizziness especially with bending forward. HA worse in the AM. Pain is undescribable, located at top of head. He has maxillary sinus pressure, post nasal drip, scratchy throat, b/l ear pain, hotflashes, nausea, SOB. He has been using  Prn neb. No cough, f/c.   He has hx seasonal allergy, asthma/COPD and DM. He is taking singulair, flonase and prn proventil nebs. Last A1c 6.4%. Atrial flutter rate controlled with pacerone. Pt requests referral to eye Dr for diabetic eye care  Past Medical History  Diagnosis Date  . Benign essential hypertension   . Gout attack 09/2012  . Allergy   . Asthma   . Sinusitis, maxillary, chronic   . Type II or unspecified type diabetes mellitus without mention of complication, not stated as uncontrolled   . Atrophic gastritis   . COPD (chronic obstructive pulmonary disease) ([place])   . Diabetes ([place])   . GERD (gastroesophageal reflux disease)     Past Surgical History  Procedure Laterality Date  . Hernia repair  1980  . Colonoscopy  2003    Patient Care Team: Gayland Curry, DO as PCP - General (Geriatric Medicine) Troy Sine, MD as Consulting Physician (Cardiology) Jerene Bears, MD as Consulting Physician (Gastroenterology) Tanda Rockers, MD as Consulting Physician (Pulmonary Disease) Clent Jacks, MD as Consulting Physician (Ophthalmology)  Social History   Social History  . Marital Status: Married    Spouse Name: N/A  . Number of Children: 3  . Years of Education: N/A   Occupational History  . Retired    Social History Main Topics  . Smoking status: Former Smoker -- 1.00  packs/day for 20 years    Types: Cigarettes    Quit date: 06/17/1997  . Smokeless tobacco: Never Used  . Alcohol Use: No  . Drug Use: No  . Sexual Activity: Not on file   Other Topics Concern  . Not on file   Social History Narrative   Lives in 1 story apt with his wife, Ostin Seebeck.  Sometimes exercises.  Drinks coffee.       reports that he quit smoking about 18 years ago. His smoking use included Cigarettes. He has a 20 pack-year smoking history. He has never used smokeless tobacco. He reports that he does not drink alcohol or use illicit drugs.  Family History  Problem Relation Age of Onset  . Stroke Mother   . Cancer Sister   . Diabetes Sister   . COPD Sister   . COPD Sister    Family Status  Relation Status Death Age  . Mother Deceased   . Sister Alive   . Sister Deceased   . Sister Deceased   . Father Deceased   . Daughter Alive   . Son Alive   . Daughter Alive      No Known Allergies  Medications: Patient's Medications  New Prescriptions   No medications on file  Previous Medications   ALBUTEROL (PROVENTIL) (2.5 MG/3ML) 0.083% NEBULIZER SOLUTION    INHALE CONTENTS OF 1 VIAL IN NEBULIZER EVERY 4  HOURS AS NEEDED FOR WHEEZING AND ASTHMA   ALLOPURINOL (ZYLOPRIM) 300 MG TABLET    Take 1 tablet (300 mg total) by mouth daily.   AMIODARONE (PACERONE) 200 MG TABLET    Take 1 tablet (200 mg total) by mouth 2 (two) times daily.   APIXABAN (ELIQUIS) 5 MG TABS TABLET    Take 1 tablet (5 mg total) by mouth 2 (two) times daily.   FLUTICASONE (FLONASE) 50 MCG/ACT NASAL SPRAY    Place 2 sprays into both nostrils daily.   LEVOTHYROXINE (SYNTHROID, LEVOTHROID) 50 MCG TABLET    TAKE 1 TABLET BY MOUTH EVERY DAY BEFORE BREAKFAST   LINAGLIPTIN (TRADJENTA) 5 MG TABS TABLET    Take 1 tablet (5 mg total) by mouth daily.   METOPROLOL SUCCINATE (TOPROL XL) 25 MG 24 HR TABLET    Take 0.5 tablets (12.5 mg total) by mouth daily.   MONTELUKAST (SINGULAIR) 10 MG TABLET    TAKE ONE  TABLET BY MOUTH AT BEDTIME AS NEEDED FOR ALLERGIES AND ASTHMA   MULTIPLE VITAMINS-MINERALS (CENTRUM SILVER ADULT 50+ PO)    Take 1 tablet by mouth daily.   PRAVASTATIN (PRAVACHOL) 20 MG TABLET    TAKE 1 TABLET BY MOUTH EVERY DAY WITH SUPPER   TORSEMIDE (DEMADEX) 20 MG TABLET    TAKE 1 TABLET BY MOUTH TWICE A DAY FOR SWELLING (HEART FAILURE)  Modified Medications   No medications on file  Discontinued Medications   No medications on file    Review of Systems  Constitutional: Positive for fatigue. Negative for fever and chills.  HENT: Positive for congestion, ear pain, hearing loss, postnasal drip and sinus pressure. Negative for sore throat.   Respiratory: Positive for shortness of breath. Negative for cough and wheezing.   Cardiovascular: Positive for leg swelling. Negative for chest pain.  Musculoskeletal: Positive for arthralgias and gait problem.  Neurological: Positive for dizziness, light-headedness and headaches.  All other systems reviewed and are negative.   Filed Vitals:   07/13/15 0822  BP: 144/82  Pulse: 122  Temp: 97.8 F (36.6 C)  TempSrc: Oral  Height: 5\' 7"  (1.702 m)  Weight: 262 lb 9.6 oz (119.115 kg)  SpO2: 92%   Body mass index is 41.12 kg/(m^2).  Physical Exam  Constitutional: He is oriented to person, place, and time. He appears well-developed and well-nourished.  Looks ill in NAD. No conversational dyspnea  HENT:  TMs dull b/l with air fluid level. No redness or bulging. L>R maxillary sinus boggy tissue texture changes with TTP. Nares patent with grey dry turbinates. Oropharynx cobblestoning and red but no exudate  Eyes: Pupils are equal, round, and reactive to light.  Neck: Neck supple.  Cardiovascular: An irregularly irregular rhythm present.  Murmur heard.  Systolic murmur is present with a grade of 1/6  +1 pitting LE edema b/l. No calf TTP  Pulmonary/Chest: He has no decreased breath sounds. He has wheezes (end expiratory with cough; prolonged  expiratory phase). He has no rhonchi. He has no rales.  Abdominal: Normal appearance. He exhibits distension. He exhibits no mass. There is no tenderness. There is no rebound and no guarding.  obese  Musculoskeletal: He exhibits edema.  Lymphadenopathy:    He has no cervical adenopathy.  Neurological: He is alert and oriented to person, place, and time.  Skin: Skin is warm and dry. No rash noted.  Chronic venous stasis changes in leg b/l. B/l foot skin changes with left toes with skin breakdown but no obvious ulcer. No d/c  Psychiatric: He has a normal mood and affect. His behavior is normal. Thought content normal.     Labs reviewed: Office Visit on 05/30/2015  Component Date Value Ref Range Status  . Creatinine, Urine 05/30/2015 108.6  Not Estab. mg/dL Final  . Microalbum.,U,Random 05/30/2015 186.7  Not Estab. ug/mL Final  . MICROALB/CREAT RATIO 05/30/2015 171.9* 0.0 - 30.0 mg/g creat Final  Appointment on 05/25/2015  Component Date Value Ref Range Status  . Glucose 05/25/2015 124* 65 - 99 mg/dL Final  . BUN 05/25/2015 14  8 - 27 mg/dL Final  . Creatinine, Ser 05/25/2015 1.59* 0.76 - 1.27 mg/dL Final  . GFR calc non Af Amer 05/25/2015 42* >59 mL/min/1.73 Final  . GFR calc Af Amer 05/25/2015 48* >59 mL/min/1.73 Final  . BUN/Creatinine Ratio 05/25/2015 9* 10 - 24 Final  . Sodium 05/25/2015 145* 134 - 144 mmol/L Final  . Potassium 05/25/2015 3.8  3.5 - 5.2 mmol/L Final  . Chloride 05/25/2015 98  96 - 106 mmol/L Final  . CO2 05/25/2015 32* 18 - 29 mmol/L Final  . Calcium 05/25/2015 9.0  8.6 - 10.2 mg/dL Final  . Hgb A1c MFr Bld 05/25/2015 6.4* 4.8 - 5.6 % Final   Comment:          Pre-diabetes: 5.7 - 6.4          Diabetes: >6.4          Glycemic control for adults with diabetes: <7.0   . Est. average glucose Bld gHb Est-m* 05/25/2015 137   Final  . Cholesterol, Total 05/25/2015 151  100 - 199 mg/dL Final  . Triglycerides 05/25/2015 76  0 - 149 mg/dL Final  . HDL 05/25/2015 68   >39 mg/dL Final  . VLDL Cholesterol Cal 05/25/2015 15  5 - 40 mg/dL Final  . LDL Calculated 05/25/2015 68  0 - 99 mg/dL Final  . Chol/HDL Ratio 05/25/2015 2.2  0.0 - 5.0 ratio units Final   Comment:                                   T. Chol/HDL Ratio                                             Men  Women                               1/2 Avg.Risk  3.4    3.3                                   Avg.Risk  5.0    4.4                                2X Avg.Risk  9.6    7.1                                3X Avg.Risk 23.4   11.0   Telephone on 05/16/2015  Component Date Value Ref Range Status  . TSH 07/07/2015 40.40* 0.40 - 4.50 mIU/L Final  Office Visit on 04/18/2015  Component Date Value Ref Range Status  . aPTT 04/18/2015 32  24 - 37 seconds Final   Comment: This test is for screening purposes only; it should not be used for therapeutic unfractionated heparin monitoring.  Please refer to Heparin Anti-Xa [id number]).   . WBC 04/18/2015 4.2  4.0 - 10.5 K/uL Final  . RBC 04/18/2015 4.83  4.22 - 5.81 MIL/uL Final  . Hemoglobin 04/18/2015 13.8  13.0 - 17.0 g/dL Final  . HCT 04/18/2015 44.6  39.0 - 52.0 % Final  . MCV 04/18/2015 92.3  78.0 - 100.0 fL Final  . MCH 04/18/2015 28.6  26.0 - 34.0 pg Final  . MCHC 04/18/2015 30.9  30.0 - 36.0 g/dL Final  . RDW 04/18/2015 14.6  11.5 - 15.5 % Final  . Platelets 04/18/2015 208  150 - 400 K/uL Final  . MPV 04/18/2015 11.3  8.6 - 12.4 fL Final  . Sodium 04/18/2015 141  135 - 146 mmol/L Final  . Potassium 04/18/2015 4.4  3.5 - 5.3 mmol/L Final  . Chloride 04/18/2015 102  98 - 110 mmol/L Final  . CO2 04/18/2015 33* 20 - 31 mmol/L Final  . Glucose, Bld 04/18/2015 105* 65 - 99 mg/dL Final  . BUN 04/18/2015 16  7 - 25 mg/dL Final  . Creat 04/18/2015 1.55* 0.70 - 1.18 mg/dL Final  . Total Bilirubin 04/18/2015 0.7  0.2 - 1.2 mg/dL Final  . Alkaline Phosphatase 04/18/2015 48  40 - 115 U/L Final  . AST 04/18/2015 26  10 - 35 U/L Final  . ALT 04/18/2015 22  9 -  46 U/L Final  . Total Protein 04/18/2015 7.0  6.1 - 8.1 g/dL Final  . Albumin 04/18/2015 4.0  3.6 - 5.1 g/dL Final  . Calcium 04/18/2015 9.1  8.6 - 10.3 mg/dL Final  . TSH 04/18/2015 10.28* 0.40 - 4.50 mIU/L Final  . Prothrombin Time 04/18/2015 14.5  11.6 - 15.2 seconds Final  . INR 04/18/2015 1.12  <1.50 Final   Comment: The INR is of principal utility in following patients on stable doses of oral anticoagulants.  The therapeutic range is generally 2.0 to 3.0, but may be 3.0 to 4.0 in patients with mechanical cardiac valves, recurrent embolisms and antiphospholipid antibodies (including lupus inhibitors).     No results found.   Assessment/Plan   ICD-9-CM ICD-10-CM   1. Acute maxillary sinusitis, recurrence not specified 461.0 J01.00 cefUROXime (CEFTIN) 250 MG tablet  2. Seasonal allergic rhinitis due to pollen 477.0 J30.1   3. Dizziness and giddiness 780.4 R42 meclizine (ANTIVERT) 25 MG tablet  4. COPD, moderate ([place]) 496 J44.9   5. Controlled type 2 diabetes mellitus with diabetic nephropathy, without long-term current use of insulin ([place]) 250.40 E11.21 Ambulatory referral to Ophthalmology   583.81  Ambulatory referral to Podiatry  6. Typical atrial flutter (HCC) 427.32 I48.3    Take antibiotic 2 times daily x 10 days  Take probiotic daily while on antibiotic  Will call with referral appts  Continue other medications as ordered  Use saline nasal spray as needed  Follow up as scheduled with Dr Lorelei Pont S. Perlie Gold  [facility] [address] [place], [place] [address] [phone] Cell (Monday-Friday 8 AM - 5 PM) [phone] After 5 PM and follow prompts

## 2015-07-29 ENCOUNTER — Telehealth: Payer: Self-pay | Admitting: *Deleted

## 2015-07-29 NOTE — Telephone Encounter (Signed)
-----   Message from Troy Sine, MD sent at 07/15/2015  5:21 PM EDT ----- TSH elevated; start synthroid 50 ug; re-checkhypothyroid panel in 3-4 weeks

## 2015-07-29 NOTE — Telephone Encounter (Signed)
Patient informed of lab results and recommendations. He then tells me that he has not taken any thyroid medication since 2-3 days after he had his blood drawn. He also informed me that he only takes his AMIODARONE once a day. Not twice. Will send a follow up note to [name] for further instructions.

## 2015-08-03 NOTE — Telephone Encounter (Signed)
TSH was 40; needs to start synthroid; suspect that dose will need to be increased on f/u labs.

## 2015-08-03 NOTE — Telephone Encounter (Signed)
Phone message sent to [name] for further review and recommendations.

## 2015-08-08 ENCOUNTER — Other Ambulatory Visit: Payer: Self-pay | Admitting: Cardiovascular Disease

## 2015-08-08 MED ORDER — AMIODARONE HCL 200 MG PO TABS: 200.0000 mg | ORAL_TABLET | Freq: Two times a day (BID) | ORAL | Status: DC

## 2015-08-08 NOTE — Telephone Encounter (Signed)
Rx request sent to pharmacy.  

## 2015-08-10 NOTE — Telephone Encounter (Signed)
Pt calling regarding three different he has questions about-pls call  [phone]

## 2015-08-10 NOTE — Telephone Encounter (Signed)
Left msg for patient to call. 

## 2015-08-15 ENCOUNTER — Other Ambulatory Visit: Payer: Self-pay

## 2015-08-15 DIAGNOSIS — E1169 Type 2 diabetes mellitus with other specified complication: Secondary | ICD-10-CM

## 2015-08-15 DIAGNOSIS — E785 Hyperlipidemia, unspecified: Secondary | ICD-10-CM

## 2015-08-15 DIAGNOSIS — E1122 Type 2 diabetes mellitus with diabetic chronic kidney disease: Secondary | ICD-10-CM

## 2015-08-15 DIAGNOSIS — E032 Hypothyroidism due to medicaments and other exogenous substances: Secondary | ICD-10-CM

## 2015-08-15 DIAGNOSIS — N186 End stage renal disease: Secondary | ICD-10-CM

## 2015-08-16 DIAGNOSIS — H10023 Other mucopurulent conjunctivitis, bilateral: Secondary | ICD-10-CM | POA: Diagnosis not present

## 2015-08-18 ENCOUNTER — Inpatient Hospital Stay (HOSPITAL_COMMUNITY)
Admission: EM | Admit: 2015-08-18 | Discharge: 2015-08-20 | DRG: 291 | Disposition: A | Discharge status: Home / Self Care | Payer: Commercial Managed Care - HMO | Attending: Internal Medicine | Admitting: Internal Medicine

## 2015-08-18 ENCOUNTER — Encounter (HOSPITAL_COMMUNITY): Payer: Self-pay | Admitting: Emergency Medicine

## 2015-08-18 ENCOUNTER — Inpatient Hospital Stay (HOSPITAL_COMMUNITY): Payer: Commercial Managed Care - HMO

## 2015-08-18 ENCOUNTER — Emergency Department (HOSPITAL_COMMUNITY): Payer: Commercial Managed Care - HMO

## 2015-08-18 DIAGNOSIS — Z825 Family history of asthma and other chronic lower respiratory diseases: Secondary | ICD-10-CM

## 2015-08-18 DIAGNOSIS — N183 Chronic kidney disease, stage 3 (moderate): Secondary | ICD-10-CM

## 2015-08-18 DIAGNOSIS — I5033 Acute on chronic diastolic (congestive) heart failure: Secondary | ICD-10-CM | POA: Diagnosis present

## 2015-08-18 DIAGNOSIS — N1832 Chronic kidney disease, stage 3b: Secondary | ICD-10-CM | POA: Diagnosis present

## 2015-08-18 DIAGNOSIS — I5031 Acute diastolic (congestive) heart failure: Secondary | ICD-10-CM | POA: Diagnosis not present

## 2015-08-18 DIAGNOSIS — Z823 Family history of stroke: Secondary | ICD-10-CM | POA: Diagnosis not present

## 2015-08-18 DIAGNOSIS — Z6841 Body Mass Index (BMI) 40.0 and over, adult: Secondary | ICD-10-CM | POA: Diagnosis not present

## 2015-08-18 DIAGNOSIS — Z7901 Long term (current) use of anticoagulants: Secondary | ICD-10-CM

## 2015-08-18 DIAGNOSIS — E1169 Type 2 diabetes mellitus with other specified complication: Secondary | ICD-10-CM | POA: Diagnosis not present

## 2015-08-18 DIAGNOSIS — J441 Chronic obstructive pulmonary disease with (acute) exacerbation: Secondary | ICD-10-CM | POA: Diagnosis present

## 2015-08-18 DIAGNOSIS — R0602 Shortness of breath: Secondary | ICD-10-CM | POA: Diagnosis not present

## 2015-08-18 DIAGNOSIS — E785 Hyperlipidemia, unspecified: Secondary | ICD-10-CM | POA: Diagnosis not present

## 2015-08-18 DIAGNOSIS — M109 Gout, unspecified: Secondary | ICD-10-CM | POA: Diagnosis present

## 2015-08-18 DIAGNOSIS — Z9114 Patient's other noncompliance with medication regimen: Secondary | ICD-10-CM | POA: Diagnosis not present

## 2015-08-18 DIAGNOSIS — E039 Hypothyroidism, unspecified: Secondary | ICD-10-CM | POA: Diagnosis present

## 2015-08-18 DIAGNOSIS — J9600 Acute respiratory failure, unspecified whether with hypoxia or hypercapnia: Secondary | ICD-10-CM | POA: Diagnosis not present

## 2015-08-18 DIAGNOSIS — J449 Chronic obstructive pulmonary disease, unspecified: Secondary | ICD-10-CM | POA: Diagnosis not present

## 2015-08-18 DIAGNOSIS — J9601 Acute respiratory failure with hypoxia: Secondary | ICD-10-CM | POA: Diagnosis present

## 2015-08-18 DIAGNOSIS — Z833 Family history of diabetes mellitus: Secondary | ICD-10-CM | POA: Diagnosis not present

## 2015-08-18 DIAGNOSIS — I071 Rheumatic tricuspid insufficiency: Secondary | ICD-10-CM | POA: Diagnosis not present

## 2015-08-18 DIAGNOSIS — J9602 Acute respiratory failure with hypercapnia: Secondary | ICD-10-CM | POA: Diagnosis present

## 2015-08-18 DIAGNOSIS — E1122 Type 2 diabetes mellitus with diabetic chronic kidney disease: Secondary | ICD-10-CM | POA: Diagnosis not present

## 2015-08-18 DIAGNOSIS — Z87891 Personal history of nicotine dependence: Secondary | ICD-10-CM

## 2015-08-18 DIAGNOSIS — I10 Essential (primary) hypertension: Secondary | ICD-10-CM | POA: Diagnosis not present

## 2015-08-18 DIAGNOSIS — I130 Hypertensive heart and chronic kidney disease with heart failure and stage 1 through stage 4 chronic kidney disease, or unspecified chronic kidney disease: Secondary | ICD-10-CM | POA: Diagnosis not present

## 2015-08-18 DIAGNOSIS — Z79899 Other long term (current) drug therapy: Secondary | ICD-10-CM | POA: Diagnosis not present

## 2015-08-18 DIAGNOSIS — J810 Acute pulmonary edema: Secondary | ICD-10-CM

## 2015-08-18 DIAGNOSIS — J811 Chronic pulmonary edema: Secondary | ICD-10-CM | POA: Diagnosis present

## 2015-08-18 LAB — BLOOD GAS, ARTERIAL
Acid-Base Excess: 4.7 mmol/L — ABNORMAL HIGH (ref 0.0–2.0)
Bicarbonate: 31.8 meq/L — ABNORMAL HIGH (ref 20.0–24.0)
Delivery systems: POSITIVE
Drawn by: 422461
Expiratory PAP: 5
FIO2: 0.4
Inspiratory PAP: 12
Mode: POSITIVE
O2 Saturation: 97.3 %
Patient temperature: 98.8
RATE: 8 {breaths}/min
TCO2: 29.3 mmol/L (ref 0–100)
pCO2 arterial: 63 mmHg (ref 35.0–45.0)
pH, Arterial: 7.324 — ABNORMAL LOW (ref 7.350–7.450)
pO2, Arterial: 109 mmHg — ABNORMAL HIGH (ref 80.0–100.0)

## 2015-08-18 LAB — MRSA PCR SCREENING: MRSA by PCR: NEGATIVE

## 2015-08-18 LAB — CBC WITH DIFFERENTIAL/PLATELET
Basophils Absolute: 0 K/uL (ref 0.0–0.1)
Basophils Relative: 0 %
Eosinophils Absolute: 0.1 K/uL (ref 0.0–0.7)
Eosinophils Relative: 1 %
HCT: 38.1 % — ABNORMAL LOW (ref 39.0–52.0)
Hemoglobin: 11.9 g/dL — ABNORMAL LOW (ref 13.0–17.0)
Lymphocytes Relative: 13 %
Lymphs Abs: 0.8 K/uL (ref 0.7–4.0)
MCH: 30.8 pg (ref 26.0–34.0)
MCHC: 31.2 g/dL (ref 30.0–36.0)
MCV: 98.7 fL (ref 78.0–100.0)
Monocytes Absolute: 0.6 K/uL (ref 0.1–1.0)
Monocytes Relative: 10 %
Neutro Abs: 4.9 K/uL (ref 1.7–7.7)
Neutrophils Relative %: 76 %
Platelets: 238 K/uL (ref 150–400)
RBC: 3.86 MIL/uL — ABNORMAL LOW (ref 4.22–5.81)
RDW: 16.9 % — ABNORMAL HIGH (ref 11.5–15.5)
WBC: 6.5 K/uL (ref 4.0–10.5)

## 2015-08-18 LAB — URINALYSIS, ROUTINE W REFLEX MICROSCOPIC
Bilirubin Urine: NEGATIVE
GLUCOSE, UA: NEGATIVE mg/dL
HGB URINE DIPSTICK: NEGATIVE
Ketones, ur: NEGATIVE mg/dL
LEUKOCYTES UA: NEGATIVE
Nitrite: NEGATIVE
PROTEIN: 30 mg/dL — AB
SPECIFIC GRAVITY, URINE: 1.016 (ref 1.005–1.030)
pH: 5.5 (ref 5.0–8.0)

## 2015-08-18 LAB — URINE MICROSCOPIC-ADD ON: RBC / HPF: NONE SEEN RBC/hpf (ref 0–5)

## 2015-08-18 LAB — COMPREHENSIVE METABOLIC PANEL
ALBUMIN: 4 g/dL (ref 3.5–5.0)
ALK PHOS: 76 U/L (ref 38–126)
ALT: 27 U/L (ref 17–63)
AST: 49 U/L — AB (ref 15–41)
Anion gap: 9 (ref 5–15)
BILIRUBIN TOTAL: 0.7 mg/dL (ref 0.3–1.2)
BUN: 26 mg/dL — AB (ref 6–20)
CO2: 32 mmol/L (ref 22–32)
CREATININE: 1.84 mg/dL — AB (ref 0.61–1.24)
Calcium: 8.4 mg/dL — ABNORMAL LOW (ref 8.9–10.3)
Chloride: 99 mmol/L — ABNORMAL LOW (ref 101–111)
GFR calc Af Amer: 39 mL/min — ABNORMAL LOW (ref >60)
GFR, EST NON AFRICAN AMERICAN: 34 mL/min — AB (ref >60)
GLUCOSE: 96 mg/dL (ref 65–99)
Potassium: 4.1 mmol/L (ref 3.5–5.1)
Sodium: 140 mmol/L (ref 135–145)
TOTAL PROTEIN: 7.9 g/dL (ref 6.5–8.1)

## 2015-08-18 LAB — TSH: TSH: 26.752 u[IU]/mL — ABNORMAL HIGH (ref 0.350–4.500)

## 2015-08-18 LAB — ECHOCARDIOGRAM COMPLETE
HEIGHTINCHES: 66 in
WEIGHTICAEL: 4112 [oz_av]

## 2015-08-18 LAB — I-STAT CG4 LACTIC ACID, ED: Lactic Acid, Venous: 1.44 mmol/L (ref 0.5–1.9)

## 2015-08-18 LAB — GLUCOSE, CAPILLARY: GLUCOSE-CAPILLARY: 119 mg/dL — AB (ref 65–99)

## 2015-08-18 LAB — I-STAT TROPONIN, ED: Troponin i, poc: 0.04 ng/mL (ref 0.00–0.08)

## 2015-08-18 LAB — BRAIN NATRIURETIC PEPTIDE: B Natriuretic Peptide: 306.6 pg/mL — ABNORMAL HIGH (ref 0.0–100.0)

## 2015-08-18 LAB — T4, FREE: FREE T4: 0.35 ng/dL — AB (ref 0.61–1.12)

## 2015-08-18 MED ORDER — FUROSEMIDE 10 MG/ML IJ SOLN
40.0000 mg | Freq: Two times a day (BID) | INTRAMUSCULAR | Status: DC
  Administered 2015-08-18 – 2015-08-20 (×4): 40 mg via INTRAVENOUS
  Filled 2015-08-18 (×4): qty 4

## 2015-08-18 MED ORDER — ACETAMINOPHEN 325 MG PO TABS: 650.0000 mg | ORAL_TABLET | ORAL | Status: DC | PRN

## 2015-08-18 MED ORDER — SODIUM CHLORIDE 0.9% FLUSH
3.0000 mL | Freq: Two times a day (BID) | INTRAVENOUS | Status: DC
  Administered 2015-08-18 – 2015-08-19 (×4): 3 mL via INTRAVENOUS

## 2015-08-18 MED ORDER — APIXABAN 5 MG PO TABS
5.0000 mg | ORAL_TABLET | Freq: Two times a day (BID) | ORAL | Status: DC
  Administered 2015-08-18 – 2015-08-19 (×3): 5 mg via ORAL
  Filled 2015-08-18 (×3): qty 1

## 2015-08-18 MED ORDER — METOPROLOL SUCCINATE ER 25 MG PO TB24
12.5000 mg | ORAL_TABLET | Freq: Every day | ORAL | Status: DC
  Administered 2015-08-18 – 2015-08-19 (×2): 12.5 mg via ORAL
  Filled 2015-08-18 (×2): qty 1

## 2015-08-18 MED ORDER — AMIODARONE HCL 200 MG PO TABS
200.0000 mg | ORAL_TABLET | Freq: Two times a day (BID) | ORAL | Status: DC
  Administered 2015-08-18 – 2015-08-19 (×3): 200 mg via ORAL
  Filled 2015-08-18 (×3): qty 1

## 2015-08-18 MED ORDER — LEVOFLOXACIN IN D5W 750 MG/150ML IV SOLN
750.0000 mg | Freq: Once | INTRAVENOUS | Status: AC
  Administered 2015-08-18: 750 mg via INTRAVENOUS
  Filled 2015-08-18: qty 150

## 2015-08-18 MED ORDER — METHYLPREDNISOLONE SODIUM SUCC 125 MG IJ SOLR
125.0000 mg | Freq: Once | INTRAMUSCULAR | Status: AC
  Administered 2015-08-18: 125 mg via INTRAVENOUS
  Filled 2015-08-18: qty 2

## 2015-08-18 MED ORDER — SODIUM CHLORIDE 0.9% FLUSH: 3.0000 mL | INTRAVENOUS | Status: DC | PRN

## 2015-08-18 MED ORDER — ALBUTEROL SULFATE (2.5 MG/3ML) 0.083% IN NEBU
5.0000 mg | INHALATION_SOLUTION | Freq: Once | RESPIRATORY_TRACT | Status: AC
  Administered 2015-08-18: 5 mg via RESPIRATORY_TRACT
  Filled 2015-08-18: qty 6

## 2015-08-18 MED ORDER — LEVOTHYROXINE SODIUM 50 MCG PO TABS: 50.0000 ug | ORAL_TABLET | Freq: Every day | ORAL | Status: DC

## 2015-08-18 MED ORDER — LINAGLIPTIN 5 MG PO TABS
5.0000 mg | ORAL_TABLET | Freq: Every day | ORAL | Status: DC
  Administered 2015-08-18 – 2015-08-19 (×2): 5 mg via ORAL
  Filled 2015-08-18 (×3): qty 1

## 2015-08-18 MED ORDER — INSULIN ASPART 100 UNIT/ML ~~LOC~~ SOLN
0.0000 [IU] | Freq: Three times a day (TID) | SUBCUTANEOUS | Status: DC
  Administered 2015-08-19: 1 [IU] via SUBCUTANEOUS

## 2015-08-18 MED ORDER — CETYLPYRIDINIUM CHLORIDE 0.05 % MT LIQD
7.0000 mL | Freq: Two times a day (BID) | OROMUCOSAL | Status: DC
  Administered 2015-08-18 – 2015-08-19 (×3): 7 mL via OROMUCOSAL

## 2015-08-18 MED ORDER — FUROSEMIDE 10 MG/ML IJ SOLN
40.0000 mg | Freq: Once | INTRAMUSCULAR | Status: AC
  Administered 2015-08-18: 40 mg via INTRAVENOUS
  Filled 2015-08-18: qty 4

## 2015-08-18 MED ORDER — PRAVASTATIN SODIUM 20 MG PO TABS
20.0000 mg | ORAL_TABLET | Freq: Every day | ORAL | Status: DC
  Administered 2015-08-18 – 2015-08-19 (×2): 20 mg via ORAL
  Filled 2015-08-18 (×2): qty 1

## 2015-08-18 MED ORDER — IPRATROPIUM-ALBUTEROL 0.5-2.5 (3) MG/3ML IN SOLN
3.0000 mL | Freq: Four times a day (QID) | RESPIRATORY_TRACT | Status: DC | PRN
Start: 2015-08-18 — End: 2015-08-20
  Administered 2015-08-19: 3 mL via RESPIRATORY_TRACT
  Filled 2015-08-18: qty 3

## 2015-08-18 MED ORDER — ONDANSETRON HCL 4 MG/2ML IJ SOLN: 4.0000 mg | Freq: Four times a day (QID) | INTRAMUSCULAR | Status: DC | PRN

## 2015-08-18 MED ORDER — SODIUM CHLORIDE 0.9 % IV SOLN: 250.0000 mL | INTRAVENOUS | Status: DC | PRN

## 2015-08-18 MED ORDER — MECLIZINE HCL 25 MG PO TABS
25.0000 mg | ORAL_TABLET | Freq: Three times a day (TID) | ORAL | Status: DC | PRN
  Filled 2015-08-18: qty 1

## 2015-08-18 NOTE — Progress Notes (Signed)
Pt transported to ICU from ED on BIPAP.

## 2015-08-18 NOTE — ED Notes (Signed)
Pt arrived POV from home with family c/o shob x 4 weeks worse tonight with shob at rest, +cough. Speaking short sentences

## 2015-08-18 NOTE — Progress Notes (Signed)
  Echocardiogram 2D Echocardiogram has been performed.  Darlina Sicilian M 08/18/2015, 3:48 PM

## 2015-08-18 NOTE — ED Provider Notes (Signed)
CSN: [id number]     Arrival date & time 08/18/15  0319 History   First MD Initiated Contact with Patient 08/18/15 [phone]     Chief Complaint  Patient presents with  . Shortness of Breath     (Consider location/radiation/quality/duration/timing/severity/associated sxs/prior Treatment) HPI Patient presents with several weeks of shortness of breath and productive cough of yellow sputum. He had worsening shortness breath this evening. Associated wheezing. Denies any chest pain. Patient does have bilateral lower extremity swelling which has worsened over the last few weeks. States he's been compliant with furosemide. He's had subjective fevers and shaking chills. Past Medical History  Diagnosis Date  . Benign essential hypertension   . Gout attack 09/2012  . Allergy   . Asthma   . Sinusitis, maxillary, chronic   . Type II or unspecified type diabetes mellitus without mention of complication, not stated as uncontrolled   . Atrophic gastritis   . COPD (chronic obstructive pulmonary disease) ([place])   . Diabetes ([place])   . GERD (gastroesophageal reflux disease)    Past Surgical History  Procedure Laterality Date  . Hernia repair  1980  . Colonoscopy  2003   Family History  Problem Relation Age of Onset  . Stroke Mother   . Cancer Sister   . Diabetes Sister   . COPD Sister   . COPD Sister    Social History  Substance Use Topics  . Smoking status: Former Smoker -- 1.00 packs/day for 20 years    Types: Cigarettes    Quit date: 06/17/1997  . Smokeless tobacco: Never Used  . Alcohol Use: No    Review of Systems  Constitutional: Positive for fever, chills, diaphoresis and fatigue.  HENT: Negative for congestion, sore throat, trouble swallowing and voice change.   Respiratory: Positive for cough, shortness of breath and wheezing. Negative for chest tightness.   Cardiovascular: Positive for leg swelling. Negative for chest pain and palpitations.  Gastrointestinal: Negative for nausea,  vomiting, abdominal pain, diarrhea and constipation.  Musculoskeletal: Negative for myalgias, back pain and neck pain.  Skin: Negative for rash and wound.  Neurological: Positive for weakness (generalized). Negative for dizziness, light-headedness, numbness and headaches.  All other systems reviewed and are negative.     Allergies  Review of patient's allergies indicates no known allergies.  Home Medications   Prior to Admission medications   Medication Sig Start Date End Date Taking? Authorizing Provider  albuterol (PROVENTIL) (2.5 MG/3ML) 0.083% nebulizer solution INHALE CONTENTS OF 1 VIAL IN NEBULIZER EVERY 4 HOURS AS NEEDED FOR WHEEZING AND ASTHMA 01/13/15  Yes [name] L [name], DO  allopurinol (ZYLOPRIM) 300 MG tablet Take 1 tablet (300 mg total) by mouth daily. 05/04/15  Yes Gildardo Cranker, DO  amiodarone (PACERONE) 200 MG tablet Take 1 tablet (200 mg total) by mouth 2 (two) times daily. 08/08/15  Yes Troy Sine, MD  apixaban (ELIQUIS) 5 MG TABS tablet Take 1 tablet (5 mg total) by mouth 2 (two) times daily. 12/16/14  Yes Troy Sine, MD  gentamicin (GARAMYCIN) 0.3 % ophthalmic solution Place 2 drops into both eyes 4 (four) times daily. 08/16/15  Yes Historical Provider, MD  levothyroxine (SYNTHROID, LEVOTHROID) 50 MCG tablet TAKE 50 MCG BY MOUTH EVERY DAY BEFORE BREAKFAST 05/16/15  Yes Historical Provider, MD  linagliptin (TRADJENTA) 5 MG TABS tablet Take 1 tablet (5 mg total) by mouth daily. 04/19/15  Yes Lauree Chandler, NP  meclizine (ANTIVERT) 25 MG tablet Take 1 tablet (25 mg total) by mouth  3 (three) times daily as needed for dizziness. 07/13/15  Yes Monica Carter, DO  montelukast (SINGULAIR) 10 MG tablet TAKE ONE TABLET BY MOUTH AT BEDTIME AS NEEDED FOR ALLERGIES AND ASTHMA Patient taking differently: TAKE 10 MG BY MOUTH AT BEDTIME AS NEEDED FOR ALLERGIES AND ASTHMA 06/27/15  Yes [name] L [name], DO  Multiple Vitamins-Minerals (CENTRUM SILVER ADULT 50+ PO) Take 1 tablet by mouth  daily.   Yes Historical Provider, MD  pravastatin (PRAVACHOL) 20 MG tablet TAKE 1 TABLET BY MOUTH EVERY DAY WITH SUPPER Patient taking differently: TAKE 20 MG BY MOUTH EVERY DAY WITH SUPPER 05/26/15  Yes [name] L [name], DO  torsemide (DEMADEX) 20 MG tablet TAKE 1 TABLET BY MOUTH TWICE A DAY FOR SWELLING (HEART FAILURE) Patient taking differently: TAKE 20 MG BY MOUTH TWICE A DAY FOR SWELLING (HEART FAILURE) 05/16/15  Yes [name] L [name], DO  cefUROXime (CEFTIN) 250 MG tablet Take 1 tablet (250 mg total) by mouth 2 (two) times daily with a meal. Patient not taking: Reported on 08/18/2015 07/13/15   Gildardo Cranker, DO  fluticasone (FLONASE) 50 MCG/ACT nasal spray Place 2 sprays into both nostrils daily. Patient not taking: Reported on 08/18/2015 05/30/15   [name] L [name], DO  metoprolol succinate (TOPROL XL) 25 MG 24 hr tablet Take 0.5 tablets (12.5 mg total) by mouth daily. Patient not taking: Reported on 08/18/2015 04/18/15   Troy Sine, MD   BP 143/84 mmHg  Pulse 68  Temp(Src) 98.6 F (37 C) (Axillary)  Resp 24  Ht 5\' 6"  (1.676 m)  Wt 257 lb (116.574 kg)  BMI 41.50 kg/m2  SpO2 100% Physical Exam  Constitutional: He is oriented to person, place, and time. He appears well-developed and well-nourished. He appears distressed.  HENT:  Head: Normocephalic and atraumatic.  Mouth/Throat: Oropharynx is clear and moist. No oropharyngeal exudate.  Eyes: EOM are normal. Pupils are equal, round, and reactive to light. Left eye exhibits discharge (yellow left eye discharge).  Neck: Normal range of motion. Neck supple. JVD present.  Cardiovascular: Normal rate and regular rhythm.  Exam reveals no gallop and no friction rub.   No murmur heard. Pulmonary/Chest: He is in respiratory distress. He has wheezes. He has rales. He exhibits no tenderness.  Increased work of breathing. Speaking in short sentences. Expiratory wheezing throughout with crackles bilateral bases  Abdominal: Soft. Bowel sounds are normal.  He exhibits no distension and no mass. There is no tenderness. There is no rebound and no guarding.  Musculoskeletal: Normal range of motion. He exhibits edema (2+ bilateral lower extremity edema. No asymmetry or tenderness.). He exhibits no tenderness.  Neurological: He is alert and oriented to person, place, and time.  Moves all extremities without deficit. Sensation is fully intact.  Skin: Skin is warm and dry. No rash noted. No erythema.  Psychiatric: He has a normal mood and affect. His behavior is normal.  Nursing note and vitals reviewed.   ED Course  Procedures (including critical care time) Labs Review Labs Reviewed  CBC WITH DIFFERENTIAL/PLATELET - Abnormal; Notable for the following:    RBC 3.86 (*)    Hemoglobin 11.9 (*)    HCT 38.1 (*)    RDW 16.9 (*)    All other components within normal limits  COMPREHENSIVE METABOLIC PANEL - Abnormal; Notable for the following:    Chloride 99 (*)    BUN 26 (*)    Creatinine, Ser 1.84 (*)    Calcium 8.4 (*)    AST 49 (*)  GFR calc non Af Amer 34 (*)    GFR calc Af Amer 39 (*)    All other components within normal limits  BRAIN NATRIURETIC PEPTIDE - Abnormal; Notable for the following:    B Natriuretic Peptide 306.6 (*)    All other components within normal limits  URINALYSIS, ROUTINE W REFLEX MICROSCOPIC (NOT AT [facility]) - Abnormal; Notable for the following:    Protein, ur 30 (*)    All other components within normal limits  URINE MICROSCOPIC-ADD ON - Abnormal; Notable for the following:    Squamous Epithelial / LPF 0-5 (*)    Bacteria, UA RARE (*)    All other components within normal limits  BLOOD GAS, ARTERIAL - Abnormal; Notable for the following:    pH, Arterial 7.324 (*)    pCO2 arterial 63.0 (*)    pO2, Arterial 109 (*)    Bicarbonate 31.8 (*)    Acid-Base Excess 4.7 (*)    All other components within normal limits  CULTURE, BLOOD (ROUTINE X 2)  CULTURE, BLOOD (ROUTINE X 2)  URINE CULTURE  I-STAT TROPOININ, ED   I-STAT CG4 LACTIC ACID, ED    Imaging Review Dg Chest Port 1 View  08/18/2015  CLINICAL DATA:  Worsening shortness of breath for week. History of COPD, diabetes. EXAM: PORTABLE CHEST 1 VIEW COMPARISON:  Chest radiograph April 20, 2015 FINDINGS: The cardiac silhouette appears moderately enlarged, potentially accentuated by AP technique. Tortuous aorta with calcified aortic knob. Pulmonary vascular congestion and diffuse interstitial prominence without pleural effusion or focal consolidation. No pneumothorax. Large body habitus. Osseous structures are nonsuspicious. IMPRESSION: Increasing cardiomegaly with interstitial edema. Electronically Signed   By: Elon Alas M.D.   On: 08/18/2015 04:21   I have personally reviewed and evaluated these images and lab results as part of my medical decision-making.   EKG Interpretation   Date/Time:  Thursday August 18 2015 03:39:12 EDT Ventricular Rate:  72 PR Interval:    QRS Duration: 93 QT Interval:  608 QTC Calculation: 680 R Axis:   -111 Text Interpretation:  Atrial fibrillation Ventricular bigeminy Left  anterior fascicular block Probable anterior infarct, age indeterminate  Abnormal T, consider ischemia, inferior leads Prolonged QT interval  Baseline wander in lead(s) II III aVF Confirmed by Lita Mains  MD, Aitanna Haubner  ([id number]) on 08/18/2015 7:29:08 AM     CRITICAL CARE Performed by: Lita Mains, Brelee Renk Total critical care time:30 minutes Critical care time was exclusive of separately billable procedures and treating other patients. Critical care was necessary to treat or prevent imminent or life-threatening deterioration. Critical care was time spent personally by me on the following activities: development of treatment plan with patient and/or surrogate as well as nursing, discussions with consultants, evaluation of patient's response to treatment, examination of patient, obtaining history from patient or surrogate, ordering and performing  treatments and interventions, ordering and review of laboratory studies, ordering and review of radiographic studies, pulse oximetry and re-evaluation of patient's condition. MDM   Final diagnoses:  Acute pulmonary edema ([place])  COPD exacerbation ([place])    Patient initially given Solu-Medrol and albuterol breathing treatment. No improvement of his work of breathing. X-ray with evidence of pulmonary edema. Placed on BiPAP.   Patient is much more comfortable. Improved work of breathing. Given the fact patient has ineffective symptoms including productive cough of thick yellow sputum and fever and chills, will start on antibiotic for possible community acquired pneumonia. Also appears patient has a CHF exacerbation and COPD exacerbation that are complicating clinical  picture.  Discussed with Dr. Fabio Neighbors. Will admit patient to step down bed.    Julianne Rice, MD 08/18/15 [phone]

## 2015-08-18 NOTE — Progress Notes (Signed)
UR completed via [employer]

## 2015-08-18 NOTE — H&P (Signed)
History and Physical    Omar Lambert [id number] DOB: October 01, 1940 DOA: 08/18/2015  PCP: [name], DO  Outpatient Specialists: Dr. Claiborne Billings cardiology Patient coming from: home   Chief Complaint: Shortness of breath  HPI: Omar Lambert is a 75 y.o. male with medical history significant of hypertension, hyperlipidemia, type 2 diabetes, COPD, diastolic CHF, presents to the emergency room with chief complaint of shortness of breath. Patient has been having progressive dyspnea over the last 3-4 weeks, acutely worsen last night which prompted him to come to the emergency room. He denies any chest pain. He denies any palpitations. He endorses a cough that has been progressive over the last 3-4 weeks with white frothy sputum production. He also has noticed fluid buildup in his legs and weight gain over the last month. He has been unable to lay flat. He denies any fevers however endorses occasional chills. He has wheezing on and off at home. He denies any lightheadedness or dizziness. He has no abdominal pain, no nausea, vomiting or diarrhea   ED Course: In the ED he was found to be hypoxic and hypercarbic on ABG requiring BiPAP. Chest x-ray showed increased cardiomegaly with interstitial edema. He was given steroids, antibiotics as well as Lasix and TRH was asked for admission to stepdown for CHF exacerbation  Review of Systems: As per HPI otherwise 10 point review of systems negative.   Past Medical History  Diagnosis Date  . Benign essential hypertension   . Gout attack 09/2012  . Allergy   . Asthma   . Sinusitis, maxillary, chronic   . Type II or unspecified type diabetes mellitus without mention of complication, not stated as uncontrolled   . Atrophic gastritis   . COPD (chronic obstructive pulmonary disease) ([place])   . Diabetes ([place])   . GERD (gastroesophageal reflux disease)     Past Surgical History  Procedure Laterality Date  . Hernia repair  1980  . Colonoscopy  2003     reports that he quit smoking about 18 years ago. His smoking use included Cigarettes. He has a 20 pack-year smoking history. He has never used smokeless tobacco. He reports that he does not drink alcohol or use illicit drugs.  No Known Allergies  Family History  Problem Relation Age of Onset  . Stroke Mother   . Cancer Sister   . Diabetes Sister   . COPD Sister   . COPD Sister     Prior to Admission medications   Medication Sig Start Date End Date Taking? Authorizing Provider  albuterol (PROVENTIL) (2.5 MG/3ML) 0.083% nebulizer solution INHALE CONTENTS OF 1 VIAL IN NEBULIZER EVERY 4 HOURS AS NEEDED FOR WHEEZING AND ASTHMA 01/13/15  Yes [name] L [name], DO  allopurinol (ZYLOPRIM) 300 MG tablet Take 1 tablet (300 mg total) by mouth daily. 05/04/15  Yes Gildardo Cranker, DO  amiodarone (PACERONE) 200 MG tablet Take 1 tablet (200 mg total) by mouth 2 (two) times daily. 08/08/15  Yes Troy Sine, MD  apixaban (ELIQUIS) 5 MG TABS tablet Take 1 tablet (5 mg total) by mouth 2 (two) times daily. 12/16/14  Yes Troy Sine, MD  gentamicin (GARAMYCIN) 0.3 % ophthalmic solution Place 2 drops into both eyes 4 (four) times daily. 08/16/15  Yes Historical Provider, MD  levothyroxine (SYNTHROID, LEVOTHROID) 50 MCG tablet TAKE 50 MCG BY MOUTH EVERY DAY BEFORE BREAKFAST 05/16/15  Yes Historical Provider, MD  linagliptin (TRADJENTA) 5 MG TABS tablet Take 1 tablet (5 mg total) by mouth daily. 04/19/15  Yes Lauree Chandler, NP  meclizine (ANTIVERT) 25 MG tablet Take 1 tablet (25 mg total) by mouth 3 (three) times daily as needed for dizziness. 07/13/15  Yes Monica Carter, DO  montelukast (SINGULAIR) 10 MG tablet TAKE ONE TABLET BY MOUTH AT BEDTIME AS NEEDED FOR ALLERGIES AND ASTHMA Patient taking differently: TAKE 10 MG BY MOUTH AT BEDTIME AS NEEDED FOR ALLERGIES AND ASTHMA 06/27/15  Yes [name] L [name], DO  Multiple Vitamins-Minerals (CENTRUM SILVER ADULT 50+ PO) Take 1 tablet by mouth daily.   Yes Historical Provider,  MD  pravastatin (PRAVACHOL) 20 MG tablet TAKE 1 TABLET BY MOUTH EVERY DAY WITH SUPPER Patient taking differently: TAKE 20 MG BY MOUTH EVERY DAY WITH SUPPER 05/26/15  Yes [name] L [name], DO  torsemide (DEMADEX) 20 MG tablet TAKE 1 TABLET BY MOUTH TWICE A DAY FOR SWELLING (HEART FAILURE) Patient taking differently: TAKE 20 MG BY MOUTH TWICE A DAY FOR SWELLING (HEART FAILURE) 05/16/15  Yes [name] L [name], DO  cefUROXime (CEFTIN) 250 MG tablet Take 1 tablet (250 mg total) by mouth 2 (two) times daily with a meal. Patient not taking: Reported on 08/18/2015 07/13/15   Gildardo Cranker, DO  fluticasone (FLONASE) 50 MCG/ACT nasal spray Place 2 sprays into both nostrils daily. Patient not taking: Reported on 08/18/2015 05/30/15   [name] L [name], DO  metoprolol succinate (TOPROL XL) 25 MG 24 hr tablet Take 0.5 tablets (12.5 mg total) by mouth daily. Patient not taking: Reported on 08/18/2015 04/18/15   Troy Sine, MD    Physical Exam: Filed Vitals:   08/18/15 0338 08/18/15 0500 08/18/15 0604 08/18/15 0641  BP: 143/84   143/84  Pulse: 73 74 70 68  Temp: 98.8 F (37.1 C)   98.6 F (37 C)  TempSrc: Oral   Axillary  Resp: 29 32 31 24  Height: 5\' 6"  (1.676 m)     Weight: 116.574 kg (257 lb)     SpO2: 90% 100% 100% 100%      Constitutional: NAD, calm on BiPAP  Filed Vitals:   08/18/15 0338 08/18/15 0500 08/18/15 0604 08/18/15 0641  BP: 143/84   143/84  Pulse: 73 74 70 68  Temp: 98.8 F (37.1 C)   98.6 F (37 C)  TempSrc: Oral   Axillary  Resp: 29 32 31 24  Height: 5\' 6"  (1.676 m)     Weight: 116.574 kg (257 lb)     SpO2: 90% 100% 100% 100%   Eyes: PERRL ENMT: Mucous membranes are moist. Respiratory: No wheezing, moves air well, by bilateral crackles Cardiovascular: Regular rate and rhythm, no murmurs / rubs / gallops. 2+ pitting lower extremity edema. 2+ pedal pulses. + JVD Abdomen: no tenderness, no masses palpated.  Musculoskeletal: no clubbing / cyanosis.  Neurologic: Nonfocal    Psychiatric: Normal judgment and insight. Alert and oriented x 3. Normal mood.   Labs on Admission: I have personally reviewed following labs and imaging studies  CBC:  Recent Labs Lab 08/18/15 0413  WBC 6.5  NEUTROABS 4.9  HGB 11.9*  HCT 38.1*  MCV 98.7  PLT 99991111   Basic Metabolic Panel:  Recent Labs Lab 08/18/15 0413  NA 140  K 4.1  CL 99*  CO2 32  GLUCOSE 96  BUN 26*  CREATININE 1.84*  CALCIUM 8.4*   GFR: Estimated Creatinine Clearance: 41 mL/min (by C-G formula based on Cr of 1.84). Liver Function Tests:  Recent Labs Lab 08/18/15 0413  AST 49*  ALT 27  ALKPHOS 76  BILITOT 0.7  PROT 7.9  ALBUMIN 4.0   No results for input(s): LIPASE, AMYLASE in the last 168 hours. No results for input(s): AMMONIA in the last 168 hours. Coagulation Profile: No results for input(s): INR, PROTIME in the last 168 hours. Cardiac Enzymes: No results for input(s): CKTOTAL, CKMB, CKMBINDEX, TROPONINI in the last 168 hours. BNP (last 3 results) No results for input(s): PROBNP in the last 8760 hours. HbA1C: No results for input(s): HGBA1C in the last 72 hours. CBG: No results for input(s): GLUCAP in the last 168 hours. Lipid Profile: No results for input(s): CHOL, HDL, LDLCALC, TRIG, CHOLHDL, LDLDIRECT in the last 72 hours. Thyroid Function Tests: No results for input(s): TSH, T4TOTAL, FREET4, T3FREE, THYROIDAB in the last 72 hours. Anemia Panel: No results for input(s): VITAMINB12, FOLATE, FERRITIN, TIBC, IRON, RETICCTPCT in the last 72 hours. Urine analysis:    Component Value Date/Time   COLORURINE YELLOW 08/18/2015 0429   APPEARANCEUR CLEAR 08/18/2015 0429   LABSPEC 1.016 08/18/2015 0429   PHURINE 5.5 08/18/2015 0429   GLUCOSEU NEGATIVE 08/18/2015 0429   HGBUR NEGATIVE 08/18/2015 0429   BILIRUBINUR NEGATIVE 08/18/2015 0429   KETONESUR NEGATIVE 08/18/2015 0429   PROTEINUR 30* 08/18/2015 0429   UROBILINOGEN 0.2 06/17/2012 0622   NITRITE NEGATIVE 08/18/2015 0429    LEUKOCYTESUR NEGATIVE 08/18/2015 0429   Sepsis Labs: @LABRCNTIP (procalcitonin:4,lacticidven:4) )No results found for this or any previous visit (from the past 240 hour(s)).   Radiological Exams on Admission: Dg Chest Port 1 View  08/18/2015  CLINICAL DATA:  Worsening shortness of breath for week. History of COPD, diabetes. EXAM: PORTABLE CHEST 1 VIEW COMPARISON:  Chest radiograph April 20, 2015 FINDINGS: The cardiac silhouette appears moderately enlarged, potentially accentuated by AP technique. Tortuous aorta with calcified aortic knob. Pulmonary vascular congestion and diffuse interstitial prominence without pleural effusion or focal consolidation. No pneumothorax. Large body habitus. Osseous structures are nonsuspicious. IMPRESSION: Increasing cardiomegaly with interstitial edema. Electronically Signed   By: Elon Alas M.D.   On: 08/18/2015 04:21    EKG: Independently reviewed. Very poor tracing but appears sinus. Will repeat  Assessment/Plan Active Problems:   Benign essential hypertension   COPD GOLD II   Type 2 diabetes mellitus, controlled, with renal complications (HCC)   Chronic kidney disease (CKD) stage G3b/A2, moderately decreased glomerular filtration rate (GFR) between 30-44 mL/min/1.73 square meter and albuminuria creatinine ratio between 30-299 mg/g   Morbid obesity due to excess calories (HCC)   Hyperlipidemia associated with type 2 diabetes mellitus (HCC)   Pulmonary edema   Acute diastolic CHF (congestive heart failure) (HCC)   Acute hypoxic and hypercarbic respiratory failure - Likely in the setting of underlying COPD as well as pulmonary edema due to acute on chronic diastolic heart failure - BiPAP as needed  COPD - Does not seem to be an exacerbation, I think his symptoms and likely due to fluid overload, he does not have significant wheezing on exam, no purulent mucus production or fevers at home  Acute on chronic diastolic heart failure - Some  component of noncompliance as he admits to not taking his diuretics every day, versus possible underlying hypothyroidism - Lasix IV twice a day, strict I's and O's, daily weights - Repeat 2-D echo  Hypothyroidism  - TSH 40 2 weeks ago, started on Synthroid. Repeat TSH still high at 26, will increase to 75  History of a flutter - Status post cardioversion, repeat EKG pending as above - Continue anticoagulation, beta blockers  CKD stage III -  Creatinine just slightly elevated compared to his baseline from 1.6-1.8 - Closely monitor with diuresis  DM2 - continue home medications   DVT prophylaxis: Eliquis  Code Status: Full  Family Communication: d/w wife bedside Disposition Plan: admit to SDU Consults called: none  Admission status: inpatient    Marzetta Board, MD [facility] Pager [phone]  If 7PM-7AM, please contact night-coverage [email or web address] Password [facility]  08/18/2015, 7:28 AM

## 2015-08-18 NOTE — ED Provider Notes (Signed)
  Physical Exam  BP 143/84 mmHg  Pulse 68  Temp(Src) 98.6 F (37 C) (Axillary)  Resp 24  Ht 5\' 6"  (1.676 m)  Wt 257 lb (116.574 kg)  BMI 41.50 kg/m2  SpO2 100%  Physical Exam  ED Course  Procedures  MDM Pt has hx of COPD. Comes in with fevers, cough, phlegm.  Started on bipap - code sepsis initiated. Awaiting step down.       Varney Biles, MD 08/18/15 [phone]

## 2015-08-19 DIAGNOSIS — I10 Essential (primary) hypertension: Secondary | ICD-10-CM

## 2015-08-19 LAB — BASIC METABOLIC PANEL
ANION GAP: 8 (ref 5–15)
BUN: 28 mg/dL — ABNORMAL HIGH (ref 6–20)
CHLORIDE: 98 mmol/L — AB (ref 101–111)
CO2: 34 mmol/L — AB (ref 22–32)
Calcium: 8.5 mg/dL — ABNORMAL LOW (ref 8.9–10.3)
Creatinine, Ser: 1.49 mg/dL — ABNORMAL HIGH (ref 0.61–1.24)
GFR calc non Af Amer: 44 mL/min — ABNORMAL LOW (ref >60)
GFR, EST AFRICAN AMERICAN: 51 mL/min — AB (ref >60)
Glucose, Bld: 105 mg/dL — ABNORMAL HIGH (ref 65–99)
POTASSIUM: 4.1 mmol/L (ref 3.5–5.1)
Sodium: 140 mmol/L (ref 135–145)

## 2015-08-19 LAB — GLUCOSE, CAPILLARY
GLUCOSE-CAPILLARY: 137 mg/dL — AB (ref 65–99)
Glucose-Capillary: 96 mg/dL (ref 65–99)
Glucose-Capillary: 120 mg/dL — ABNORMAL HIGH (ref 65–99)
Glucose-Capillary: 96 mg/dL (ref 65–99)

## 2015-08-19 LAB — URINE CULTURE

## 2015-08-19 LAB — T3, FREE: T3, Free: 1.9 pg/mL — ABNORMAL LOW (ref 2.0–4.4)

## 2015-08-19 MED ORDER — SIMETHICONE 80 MG PO CHEW
80.0000 mg | CHEWABLE_TABLET | Freq: Four times a day (QID) | ORAL | Status: DC | PRN
  Administered 2015-08-19: 80 mg via ORAL
  Filled 2015-08-19: qty 1

## 2015-08-19 MED ORDER — DM-GUAIFENESIN ER 30-600 MG PO TB12
1.0000 | ORAL_TABLET | Freq: Two times a day (BID) | ORAL | Status: DC
  Administered 2015-08-19 (×2): 1 via ORAL
  Filled 2015-08-19 (×2): qty 1

## 2015-08-19 MED ORDER — LEVOTHYROXINE SODIUM 50 MCG PO TABS
75.0000 ug | ORAL_TABLET | Freq: Every day | ORAL | Status: DC
  Administered 2015-08-19 – 2015-08-20 (×2): 75 ug via ORAL
  Filled 2015-08-19 (×2): qty 1

## 2015-08-19 NOTE — Progress Notes (Signed)
PROGRESS NOTE  Omar Lambert [id number] DOB: 1940-06-21 DOA: 08/18/2015 PCP: [name], DO   LOS: 1 day   Brief Narrative: 75 y.o. male with medical history significant of hypertension, hyperlipidemia, type 2 diabetes, COPD, diastolic CHF, presents to the emergency room with chief complaint of shortness of breath and fluid overload, found to have [facility] d CHF  Assessment & Plan: Active Problems:   Benign essential hypertension   COPD GOLD II   Type 2 diabetes mellitus, controlled, with renal complications (HCC)   Chronic kidney disease (CKD) stage G3b/A2, moderately decreased glomerular filtration rate (GFR) between 30-44 mL/min/1.73 square meter and albuminuria creatinine ratio between 30-299 mg/g   Morbid obesity due to excess calories (HCC)   Hyperlipidemia associated with type 2 diabetes mellitus (HCC)   Pulmonary edema   Acute diastolic CHF (congestive heart failure) (HCC)   Acute hypoxic and hypercarbic respiratory failure - Likely in the setting of underlying COPD as well as pulmonary edema due to acute on chronic diastolic heart failure - improving, wean off oxygen as tolerated   COPD - Does not seem to be an exacerbation, I think his symptoms and likely due to fluid overload, he does not have significant wheezing on exam, no purulent mucus production or fevers at home  Acute on chronic diastolic heart failure - Some component of noncompliance as he admits to not taking his diuretics every day, versus possible underlying hypothyroidism - Lasix IV twice a day, strict I's and O's, daily weights. Weight 241 this morning, continue IV Lasix for today and convert to po tomorrow - Repeat 2-D echo similar to the one last year  Hypothyroidism  - TSH 40 2 weeks ago, started on Synthroid. Repeat TSH still high at 26, will increase to 75  History of a flutter - Status post cardioversion, repeat EKG pending as above - Continue anticoagulation, beta blockers  CKD stage III -  Creatinine just slightly elevated compared to his baseline from 1.6-1.8 - Closely monitor with diuresis, improving today.  DM2 - continue home medications  DVT prophylaxis: Eliquis Code Status: Full Family Communication: wife bedside Disposition Plan: inpatient, transfer to telemetry  Consultants:   None   Procedures:   2D echo:  Study Conclusions - Left ventricle: The cavity size was normal. There was moderate concentric hypertrophy. Systolic function was normal. The estimated ejection fraction was in the range of 60% to 65%. Wall motion was normal; there were no regional wall motion abnormalities. - Left atrium: The atrium was moderately dilated. - Right ventricle: The cavity size was mildly dilated. Wall thickness was increased. - Right atrium: The atrium was moderately dilated. - Tricuspid valve: There was moderate regurgitation. - Pulmonary arteries: Systolic pressure was mildly increased. PA peak pressure: 40 mm Hg (S).  Antimicrobials:  None    Subjective: - off BiPAP since yesterday, feeling a lot better. Asking about when he can go home   Objective: Filed Vitals:   08/19/15 0600 08/19/15 0635 08/19/15 0800 08/19/15 0927  BP: 139/84   129/75  Pulse: 58   73  Temp:   98.3 F (36.8 C)   TempSrc:   Oral   Resp: 27     Height:      Weight:      SpO2: 98% 98%      Intake/Output Summary (Last 24 hours) at 08/19/15 1027 Last data filed at 08/19/15 0852  Gross per 24 hour  Intake    120 ml  Output   1700 ml  Net  -1580 ml   Filed Weights   08/18/15 0338 08/19/15 0500  Weight: 116.574 kg (257 lb) 109.4 kg (241 lb 2.9 oz)    Examination: Constitutional: NAD Filed Vitals:   08/19/15 0600 08/19/15 0635 08/19/15 0800 08/19/15 0927  BP: 139/84   129/75  Pulse: 58   73  Temp:   98.3 F (36.8 C)   TempSrc:   Oral   Resp: 27     Height:      Weight:      SpO2: 98% 98%     Eyes: PERRL ENMT: Mucous membranes are moist. Respiratory: No wheezing, moves  air well, by bilateral crackles Cardiovascular: Regular rate and rhythm, no murmurs / rubs / gallops. trace lower extremity edema. 2+ pedal pulses. No JVD Abdomen: no tenderness, no masses palpated.  Musculoskeletal: no clubbing / cyanosis.  Neurologic: Nonfocal  Psychiatric: Normal judgment and insight. Alert and oriented x 3. Normal mood.    Data Reviewed: I have personally reviewed following labs and imaging studies  CBC:  Recent Labs Lab 08/18/15 0413  WBC 6.5  NEUTROABS 4.9  HGB 11.9*  HCT 38.1*  MCV 98.7  PLT 99991111   Basic Metabolic Panel:  Recent Labs Lab 08/18/15 0413 08/19/15 0326  NA 140 140  K 4.1 4.1  CL 99* 98*  CO2 32 34*  GLUCOSE 96 105*  BUN 26* 28*  CREATININE 1.84* 1.49*  CALCIUM 8.4* 8.5*   GFR: Estimated Creatinine Clearance: 48.9 mL/min (by C-G formula based on Cr of 1.49). Liver Function Tests:  Recent Labs Lab 08/18/15 0413  AST 49*  ALT 27  ALKPHOS 76  BILITOT 0.7  PROT 7.9  ALBUMIN 4.0   No results for input(s): LIPASE, AMYLASE in the last 168 hours. No results for input(s): AMMONIA in the last 168 hours. Coagulation Profile: No results for input(s): INR, PROTIME in the last 168 hours. Cardiac Enzymes: No results for input(s): CKTOTAL, CKMB, CKMBINDEX, TROPONINI in the last 168 hours. BNP (last 3 results) No results for input(s): PROBNP in the last 8760 hours. HbA1C: No results for input(s): HGBA1C in the last 72 hours. CBG:  Recent Labs Lab 08/18/15 1633 08/19/15 0840  GLUCAP 119* 137*   Lipid Profile: No results for input(s): CHOL, HDL, LDLCALC, TRIG, CHOLHDL, LDLDIRECT in the last 72 hours. Thyroid Function Tests:  Recent Labs  08/18/15 0826  TSH 26.752*  FREET4 0.35*  T3FREE 1.9*   Anemia Panel: No results for input(s): VITAMINB12, FOLATE, FERRITIN, TIBC, IRON, RETICCTPCT in the last 72 hours. Urine analysis:    Component Value Date/Time   COLORURINE YELLOW 08/18/2015 0429   APPEARANCEUR CLEAR  08/18/2015 0429   LABSPEC 1.016 08/18/2015 0429   PHURINE 5.5 08/18/2015 0429   GLUCOSEU NEGATIVE 08/18/2015 0429   HGBUR NEGATIVE 08/18/2015 0429   BILIRUBINUR NEGATIVE 08/18/2015 0429   KETONESUR NEGATIVE 08/18/2015 0429   PROTEINUR 30* 08/18/2015 0429   UROBILINOGEN 0.2 06/17/2012 0622   NITRITE NEGATIVE 08/18/2015 0429   LEUKOCYTESUR NEGATIVE 08/18/2015 0429   Sepsis Labs: Invalid input(s): PROCALCITONIN, LACTICIDVEN  Recent Results (from the past 240 hour(s))  MRSA PCR Screening     Status: None   Collection Time: 08/18/15  2:58 PM  Result Value Ref Range Status   MRSA by PCR NEGATIVE NEGATIVE Final    Comment:        The GeneXpert MRSA Assay (FDA approved for NASAL specimens only), is one component of a comprehensive MRSA colonization surveillance program. It is not intended to  diagnose MRSA infection nor to guide or monitor treatment for MRSA infections.       Radiology Studies: Dg Chest Port 1 View  08/18/2015  CLINICAL DATA:  Worsening shortness of breath for week. History of COPD, diabetes. EXAM: PORTABLE CHEST 1 VIEW COMPARISON:  Chest radiograph April 20, 2015 FINDINGS: The cardiac silhouette appears moderately enlarged, potentially accentuated by AP technique. Tortuous aorta with calcified aortic knob. Pulmonary vascular congestion and diffuse interstitial prominence without pleural effusion or focal consolidation. No pneumothorax. Large body habitus. Osseous structures are nonsuspicious. IMPRESSION: Increasing cardiomegaly with interstitial edema. Electronically Signed   By: Elon Alas M.D.   On: 08/18/2015 04:21     Scheduled Meds: . amiodarone  200 mg Oral BID  . antiseptic oral rinse  7 mL Mouth Rinse BID  . apixaban  5 mg Oral BID  . furosemide  40 mg Intravenous Q12H  . insulin aspart  0-9 Units Subcutaneous TID WC  . levothyroxine  75 mcg Oral QAC breakfast  . linagliptin  5 mg Oral Daily  . metoprolol succinate  12.5 mg Oral Daily  .  pravastatin  20 mg Oral q1800  . sodium chloride flush  3 mL Intravenous Q12H   Continuous Infusions:    Marzetta Board, MD, PhD [facility] Pager [phone]  If 7PM-7AM, please contact night-coverage [email or web address] Password [facility] 08/19/2015, 10:27 AM

## 2015-08-19 NOTE — Progress Notes (Signed)
Assumed care of pt from [place], [place] at [phone].  I agree with her assessment.  Pt is stable.  BP 117/64 mmHg  Pulse 69  Temp(Src) 98.1 F (36.7 C) (Oral)  Resp 18  Ht 5\' 5"  (1.651 m)  Wt 110.5 kg (243 lb 9.7 oz)  BMI 40.54 kg/m2  SpO2 98% on room air.  Will continue with plan of care.

## 2015-08-20 DIAGNOSIS — J449 Chronic obstructive pulmonary disease, unspecified: Secondary | ICD-10-CM

## 2015-08-20 DIAGNOSIS — E785 Hyperlipidemia, unspecified: Secondary | ICD-10-CM

## 2015-08-20 DIAGNOSIS — E1169 Type 2 diabetes mellitus with other specified complication: Secondary | ICD-10-CM

## 2015-08-20 LAB — BASIC METABOLIC PANEL
ANION GAP: 7 (ref 5–15)
BUN: 41 mg/dL — AB (ref 6–20)
CHLORIDE: 95 mmol/L — AB (ref 101–111)
CO2: 37 mmol/L — ABNORMAL HIGH (ref 22–32)
Calcium: 8.7 mg/dL — ABNORMAL LOW (ref 8.9–10.3)
Creatinine, Ser: 1.71 mg/dL — ABNORMAL HIGH (ref 0.61–1.24)
GFR calc Af Amer: 43 mL/min — ABNORMAL LOW (ref >60)
GFR, EST NON AFRICAN AMERICAN: 37 mL/min — AB (ref >60)
GLUCOSE: 92 mg/dL (ref 65–99)
POTASSIUM: 3.8 mmol/L (ref 3.5–5.1)
Sodium: 139 mmol/L (ref 135–145)

## 2015-08-20 LAB — CBC
HEMATOCRIT: 40.9 % (ref 39.0–52.0)
HEMOGLOBIN: 12.4 g/dL — AB (ref 13.0–17.0)
MCH: 29.9 pg (ref 26.0–34.0)
MCHC: 30.3 g/dL (ref 30.0–36.0)
MCV: 98.6 fL (ref 78.0–100.0)
Platelets: 274 10*3/uL (ref 150–400)
RBC: 4.15 MIL/uL — ABNORMAL LOW (ref 4.22–5.81)
RDW: 16.6 % — AB (ref 11.5–15.5)
WBC: 8 10*3/uL (ref 4.0–10.5)

## 2015-08-20 LAB — GLUCOSE, CAPILLARY: Glucose-Capillary: 87 mg/dL (ref 65–99)

## 2015-08-20 MED ORDER — DM-GUAIFENESIN ER 30-600 MG PO TB12: 1.0000 | ORAL_TABLET | Freq: Two times a day (BID) | ORAL | Status: DC

## 2015-08-20 MED ORDER — LEVOTHYROXINE SODIUM 75 MCG PO TABS: 75.0000 ug | ORAL_TABLET | Freq: Every day | ORAL | Status: DC

## 2015-08-20 NOTE — Discharge Summary (Signed)
Physician Discharge Summary  Omar Lambert [id number] DOB: Sep 05, 1940 DOA: 08/18/2015  PCP: [name], DO  Admit date: 08/18/2015 Discharge date: 08/20/2015  Admitted From: home Disposition:  home  Recommendations for Outpatient Follow-up:  1. Follow up with PCP in 1-2 weeks 2. Please obtain BMP/CBC in one week  Discharge Condition: stable CODE STATUS: Full Diet recommendation: heart healthy  HPI: 75 y.o. male with medical history significant of hypertension, hyperlipidemia, type 2 diabetes, COPD, diastolic CHF, presents to the emergency room with chief complaint of shortness of breath and fluid overload, found to have [facility] d CHF  Hospital Course: Discharge Diagnoses:  Active Problems:   Benign essential hypertension   COPD GOLD II   Type 2 diabetes mellitus, controlled, with renal complications (HCC)   Chronic kidney disease (CKD) stage G3b/A2, moderately decreased glomerular filtration rate (GFR) between 30-44 mL/min/1.73 square meter and albuminuria creatinine ratio between 30-299 mg/g   Morbid obesity due to excess calories (HCC)   Hyperlipidemia associated with type 2 diabetes mellitus (HCC)   Pulmonary edema   Acute diastolic CHF (congestive heart failure) (HCC)  Acute hypoxic and hypercarbic respiratory failure - Likely in the setting of underlying COPD as well as pulmonary edema due to acute on chronic diastolic heart failure. Patient initially required BiPAP and was admitted to SDU. With IV diuresis he was able to be weaned off to room air on discharge COPD - Does not seem to be an exacerbation, his symptoms were likely due to fluid overload, he does not have significant wheezing on exam, no purulent mucus production or fevers at home. Resume home meds.  Acute on chronic diastolic heart failure - Some component of noncompliance as he admits to not taking his diuretics regularly nor weighing himself daily, potentially contributing underlying hypothyroidism. He was  diuresed with Lasix, weight improved from 257 on admission to 241 on discharge which is similar to his outpatient weight from cardiology office (243). Repeat 2-D echo similar to the one last year. Discussed extensively regarding diet, medication compliance and need for daily weights. Currently does not have a scale but plans to get one.  Hypothyroidism - TSH 40 2 weeks ago, started on Synthroid. Repeat TSH still high at 26, will increase to 75 mcg. Will need repeat TSH in 3-4 weeks.  History of a flutter - Status post cardioversion. Continue anticoagulation, beta blockers. Rate in the 60s CKD stage III - Creatinine just slightly elevated compared to his baseline from 1.6-1.8, stable with diuresis, 1.7 on discharge DM2 - continue home medications   Discharge Instructions    Medication List    STOP taking these medications        cefUROXime 250 MG tablet  Commonly known as:  CEFTIN      TAKE these medications        albuterol (2.5 MG/3ML) 0.083% nebulizer solution  Commonly known as:  PROVENTIL  INHALE CONTENTS OF 1 VIAL IN NEBULIZER EVERY 4 HOURS AS NEEDED FOR WHEEZING AND ASTHMA     allopurinol 300 MG tablet  Commonly known as:  ZYLOPRIM  Take 1 tablet (300 mg total) by mouth daily.     amiodarone 200 MG tablet  Commonly known as:  PACERONE  Take 1 tablet (200 mg total) by mouth 2 (two) times daily.     apixaban 5 MG Tabs tablet  Commonly known as:  ELIQUIS  Take 1 tablet (5 mg total) by mouth 2 (two) times daily.     CENTRUM SILVER ADULT 50+  PO  Take 1 tablet by mouth daily.     dextromethorphan-guaiFENesin 30-600 MG 12hr tablet  Commonly known as:  MUCINEX DM  Take 1 tablet by mouth 2 (two) times daily.     fluticasone 50 MCG/ACT nasal spray  Commonly known as:  FLONASE  Place 2 sprays into both nostrils daily.     gentamicin 0.3 % ophthalmic solution  Commonly known as:  GARAMYCIN  Place 2 drops into both eyes 4 (four) times daily.     levothyroxine 75 MCG tablet   Commonly known as:  SYNTHROID, LEVOTHROID  Take 1 tablet (75 mcg total) by mouth daily before breakfast.     linagliptin 5 MG Tabs tablet  Commonly known as:  TRADJENTA  Take 1 tablet (5 mg total) by mouth daily.     meclizine 25 MG tablet  Commonly known as:  ANTIVERT  Take 1 tablet (25 mg total) by mouth 3 (three) times daily as needed for dizziness.     metoprolol succinate 25 MG 24 hr tablet  Commonly known as:  TOPROL XL  Take 0.5 tablets (12.5 mg total) by mouth daily.     montelukast 10 MG tablet  Commonly known as:  SINGULAIR  TAKE ONE TABLET BY MOUTH AT BEDTIME AS NEEDED FOR ALLERGIES AND ASTHMA     pravastatin 20 MG tablet  Commonly known as:  PRAVACHOL  TAKE 1 TABLET BY MOUTH EVERY DAY WITH SUPPER     torsemide 20 MG tablet  Commonly known as:  DEMADEX  TAKE 1 TABLET BY MOUTH TWICE A DAY FOR SWELLING (HEART FAILURE)           Follow-up Information    Follow up with [name], [name], DO. Schedule an appointment as soon as possible for a visit in 1 week.   Specialty:  Geriatric Medicine   Contact information:   [address] [place] [address] [phone]      No Known Allergies  Consultations:  None   Procedures/Studies:  2D echo  Study Conclusions - Left ventricle: The cavity size was normal. There was moderate concentric hypertrophy. Systolic function was normal. The estimated ejection fraction was in the range of 60% to 65%. Wall motion was normal; there were no regional wall motion abnormalities. - Left atrium: The atrium was moderately dilated. - Right ventricle: The cavity size was mildly dilated. Wall thickness was increased. - Right atrium: The atrium was moderately dilated. - Tricuspid valve: There was moderate regurgitation. - Pulmonary arteries: Systolic pressure was mildly increased. PA peak pressure: 40 mm Hg (S).  Dg Chest Port 1 View  08/18/2015  CLINICAL DATA:  Worsening shortness of breath for week. History of COPD, diabetes.  EXAM: PORTABLE CHEST 1 VIEW COMPARISON:  Chest radiograph April 20, 2015 FINDINGS: The cardiac silhouette appears moderately enlarged, potentially accentuated by AP technique. Tortuous aorta with calcified aortic knob. Pulmonary vascular congestion and diffuse interstitial prominence without pleural effusion or focal consolidation. No pneumothorax. Large body habitus. Osseous structures are nonsuspicious. IMPRESSION: Increasing cardiomegaly with interstitial edema. Electronically Signed   By: Elon Alas M.D.   On: 08/18/2015 04:21      Subjective: - no chest pain, shortness of breath, no abdominal pain, nausea or vomiting.   Discharge Exam: Filed Vitals:   08/19/15 2139 08/20/15 0511  BP: 116/70 122/67  Pulse: 60 64  Temp: 97.9 F (36.6 C) 97.6 F (36.4 C)  Resp: 20 20   Filed Vitals:   08/19/15 1140 08/19/15 1315 08/19/15 2139  08/20/15 0511  BP:  117/64 116/70 122/67  Pulse:  69 60 64  Temp: 98.6 F (37 C) 98.1 F (36.7 C) 97.9 F (36.6 C) 97.6 F (36.4 C)  TempSrc: Oral Oral Oral Oral  Resp:  18 20 20   Height:  5\' 5"  (1.651 m)    Weight:  110.5 kg (243 lb 9.7 oz)  109.6 kg (241 lb 10 oz)  SpO2:  98% 94% 92%    General: Pt is alert, awake, not in acute distress Cardiovascular: RRR, S1/S2 +, no rubs, no gallops Respiratory: CTA bilaterally, no wheezing, no rhonchi Abdominal: Soft, NT, ND, bowel sounds + Extremities: no edema, no cyanosis    The results of significant diagnostics from this hospitalization (including imaging, microbiology, ancillary and laboratory) are listed below for reference.     Microbiology: Recent Results (from the past 240 hour(s))  Culture, blood (Routine X 2) w Reflex to ID Panel     Status: None (Preliminary result)   Collection Time: 08/18/15  4:13 AM  Result Value Ref Range Status   Specimen Description BLOOD LEFT ANTECUBITAL  Final   Special Requests BOTTLES DRAWN AEROBIC AND ANAEROBIC 5CC  Final   Culture   Final    NO GROWTH 1  DAY Performed at [facility]    Report Status PENDING  Incomplete  Urine culture     Status: Abnormal   Collection Time: 08/18/15  4:29 AM  Result Value Ref Range Status   Specimen Description URINE, CLEAN CATCH  Final   Special Requests NONE  Final   Culture MULTIPLE SPECIES PRESENT, SUGGEST RECOLLECTION (A)  Final   Report Status 08/19/2015 FINAL  Final  Culture, blood (Routine X 2) w Reflex to ID Panel     Status: None (Preliminary result)   Collection Time: 08/18/15  4:50 AM  Result Value Ref Range Status   Specimen Description BLOOD RIGHT ANTECUBITAL  Final   Special Requests BOTTLES DRAWN AEROBIC AND ANAEROBIC 6CC  Final   Culture   Final    NO GROWTH 1 DAY Performed at [facility]    Report Status PENDING  Incomplete  MRSA PCR Screening     Status: None   Collection Time: 08/18/15  2:58 PM  Result Value Ref Range Status   MRSA by PCR NEGATIVE NEGATIVE Final    Comment:        The GeneXpert MRSA Assay (FDA approved for NASAL specimens only), is one component of a comprehensive MRSA colonization surveillance program. It is not intended to diagnose MRSA infection nor to guide or monitor treatment for MRSA infections.      Labs: BNP (last 3 results)  Recent Labs  08/18/15 0413  BNP [id number]*   Basic Metabolic Panel:  Recent Labs Lab 08/18/15 0413 08/19/15 0326 08/20/15 0415  NA 140 140 139  K 4.1 4.1 3.8  CL 99* 98* 95*  CO2 32 34* 37*  GLUCOSE 96 105* 92  BUN 26* 28* 41*  CREATININE 1.84* 1.49* 1.71*  CALCIUM 8.4* 8.5* 8.7*   Liver Function Tests:  Recent Labs Lab 08/18/15 0413  AST 49*  ALT 27  ALKPHOS 76  BILITOT 0.7  PROT 7.9  ALBUMIN 4.0   No results for input(s): LIPASE, AMYLASE in the last 168 hours. No results for input(s): AMMONIA in the last 168 hours. CBC:  Recent Labs Lab 08/18/15 0413 08/20/15 0415  WBC 6.5 8.0  NEUTROABS 4.9  --   HGB 11.9* 12.4*  HCT 38.1* 40.9  MCV 98.7 98.6  PLT 238 274   Cardiac  Enzymes: No results for input(s): CKTOTAL, CKMB, CKMBINDEX, TROPONINI in the last 168 hours. BNP: Invalid input(s): POCBNP CBG:  Recent Labs Lab 08/19/15 0840 08/19/15 1138 08/19/15 1642 08/19/15 2142 08/20/15 0801  GLUCAP 137* 96 120* 96 87   D-Dimer No results for input(s): DDIMER in the last 72 hours. Hgb A1c No results for input(s): HGBA1C in the last 72 hours. Lipid Profile No results for input(s): CHOL, HDL, LDLCALC, TRIG, CHOLHDL, LDLDIRECT in the last 72 hours. Thyroid function studies  Recent Labs  08/18/15 0826  TSH 26.752*  T3FREE 1.9*   Anemia work up No results for input(s): VITAMINB12, FOLATE, FERRITIN, TIBC, IRON, RETICCTPCT in the last 72 hours. Urinalysis    Component Value Date/Time   COLORURINE YELLOW 08/18/2015 0429   APPEARANCEUR CLEAR 08/18/2015 0429   LABSPEC 1.016 08/18/2015 0429   PHURINE 5.5 08/18/2015 0429   GLUCOSEU NEGATIVE 08/18/2015 0429   HGBUR NEGATIVE 08/18/2015 0429   BILIRUBINUR NEGATIVE 08/18/2015 0429   KETONESUR NEGATIVE 08/18/2015 0429   PROTEINUR 30* 08/18/2015 0429   UROBILINOGEN 0.2 06/17/2012 0622   NITRITE NEGATIVE 08/18/2015 0429   LEUKOCYTESUR NEGATIVE 08/18/2015 0429   Sepsis Labs Invalid input(s): PROCALCITONIN,  WBC,  LACTICIDVEN Microbiology Recent Results (from the past 240 hour(s))  Culture, blood (Routine X 2) w Reflex to ID Panel     Status: None (Preliminary result)   Collection Time: 08/18/15  4:13 AM  Result Value Ref Range Status   Specimen Description BLOOD LEFT ANTECUBITAL  Final   Special Requests BOTTLES DRAWN AEROBIC AND ANAEROBIC 5CC  Final   Culture   Final    NO GROWTH 1 DAY Performed at [facility]    Report Status PENDING  Incomplete  Urine culture     Status: Abnormal   Collection Time: 08/18/15  4:29 AM  Result Value Ref Range Status   Specimen Description URINE, CLEAN CATCH  Final   Special Requests NONE  Final   Culture MULTIPLE SPECIES PRESENT, SUGGEST RECOLLECTION (A)   Final   Report Status 08/19/2015 FINAL  Final  Culture, blood (Routine X 2) w Reflex to ID Panel     Status: None (Preliminary result)   Collection Time: 08/18/15  4:50 AM  Result Value Ref Range Status   Specimen Description BLOOD RIGHT ANTECUBITAL  Final   Special Requests BOTTLES DRAWN AEROBIC AND ANAEROBIC 6CC  Final   Culture   Final    NO GROWTH 1 DAY Performed at [facility]    Report Status PENDING  Incomplete  MRSA PCR Screening     Status: None   Collection Time: 08/18/15  2:58 PM  Result Value Ref Range Status   MRSA by PCR NEGATIVE NEGATIVE Final    Comment:        The GeneXpert MRSA Assay (FDA approved for NASAL specimens only), is one component of a comprehensive MRSA colonization surveillance program. It is not intended to diagnose MRSA infection nor to guide or monitor treatment for MRSA infections.      Time coordinating discharge: Over 30 minutes  SIGNED:  Marzetta Board, MD  [facility] 08/20/2015, 1:39 PM Pager [phone]  If 7PM-7AM, please contact night-coverage [email or web address] Password [email or web address]

## 2015-08-20 NOTE — Discharge Instructions (Signed)
Follow with [name], [name], DO in 5-7 days  Please get a complete blood count and chemistry panel checked by your Primary MD at your next visit, and again as instructed by your Primary MD. Please get your medications reviewed and adjusted by your Primary MD.  Please request your Primary MD to go over all Hospital Tests and Procedure/Radiological results at the follow up, please get all Hospital records sent to your Prim MD by signing hospital release before you go home.  If you had Pneumonia of Lung problems at the Hospital: Please get a 2 view Chest X ray done in 6-8 weeks after hospital discharge or sooner if instructed by your Primary MD.  If you have Congestive Heart Failure: Please call your Cardiologist or Primary MD anytime you have any of the following symptoms:  1) 3 pound weight gain in 24 hours or 5 pounds in 1 week  2) shortness of breath, with or without a dry hacking cough  3) swelling in the hands, feet or stomach  4) if you have to sleep on extra pillows at night in order to breathe  Follow cardiac low salt diet and 1.5 lit/day fluid restriction.  If you have diabetes Accuchecks 4 times/day, Once in AM empty stomach and then before each meal. Log in all results and show them to your primary doctor at your next visit. If any glucose reading is under 80 or above 300 call your primary MD immediately.  If you have Seizure/Convulsions/Epilepsy: Please do not drive, operate heavy machinery, participate in activities at heights or participate in high speed sports until you have seen by Primary MD or a Neurologist and advised to do so again.  If you had Gastrointestinal Bleeding: Please ask your Primary MD to check a complete blood count within one week of discharge or at your next visit. Your endoscopic/colonoscopic biopsies that are pending at the time of discharge, will also need to followed by your Primary MD.  Get Medicines reviewed and adjusted. Please take all your  medications with you for your next visit with your Primary MD  Please request your Primary MD to go over all hospital tests and procedure/radiological results at the follow up, please ask your Primary MD to get all Hospital records sent to his/her office.  If you experience worsening of your admission symptoms, develop shortness of breath, life threatening emergency, suicidal or homicidal thoughts you must seek medical attention immediately by calling 911 or calling your MD immediately  if symptoms less severe.  You must read complete instructions/literature along with all the possible adverse reactions/side effects for all the Medicines you take and that have been prescribed to you. Take any new Medicines after you have completely understood and accpet all the possible adverse reactions/side effects.   Do not drive or operate heavy machinery when taking Pain medications.   Do not take more than prescribed Pain, Sleep and Anxiety Medications  Special Instructions: If you have smoked or chewed Tobacco  in the last 2 yrs please stop smoking, stop any regular Alcohol  and or any Recreational drug use.  Wear Seat belts while driving.  Please note You were cared for by a hospitalist during your hospital stay. If you have any questions about your discharge medications or the care you received while you were in the hospital after you are discharged, you can call the unit and asked to speak with the hospitalist on call if the hospitalist that took care of you is not available. Once  you are discharged, your primary care physician will handle any further medical issues. Please note that NO REFILLS for any discharge medications will be authorized once you are discharged, as it is imperative that you return to your primary care physician (or establish a relationship with a primary care physician if you do not have one) for your aftercare needs so that they can reassess your need for medications and monitor your  lab values.  You can reach the hospitalist office at phone [phone] or fax [phone]   If you do not have a primary care physician, you can call [phone] for a physician referral.  Activity: As tolerated with Full fall precautions use walker/cane & assistance as needed  Diet: heart healthy, low sodium  Disposition Home

## 2015-08-22 ENCOUNTER — Ambulatory Visit: Payer: Commercial Managed Care - HMO | Admitting: Sports Medicine

## 2015-08-23 LAB — CULTURE, BLOOD (ROUTINE X 2)
CULTURE: NO GROWTH
CULTURE: NO GROWTH

## 2015-08-24 ENCOUNTER — Other Ambulatory Visit: Payer: Commercial Managed Care - HMO

## 2015-08-24 DIAGNOSIS — E1122 Type 2 diabetes mellitus with diabetic chronic kidney disease: Secondary | ICD-10-CM | POA: Diagnosis not present

## 2015-08-24 DIAGNOSIS — N186 End stage renal disease: Secondary | ICD-10-CM | POA: Diagnosis not present

## 2015-08-24 DIAGNOSIS — E785 Hyperlipidemia, unspecified: Secondary | ICD-10-CM

## 2015-08-24 DIAGNOSIS — E032 Hypothyroidism due to medicaments and other exogenous substances: Secondary | ICD-10-CM

## 2015-08-24 DIAGNOSIS — E1169 Type 2 diabetes mellitus with other specified complication: Secondary | ICD-10-CM

## 2015-08-24 LAB — HEMOGLOBIN A1C
Hgb A1c MFr Bld: 6.3 % — ABNORMAL HIGH (ref <5.7)
Mean Plasma Glucose: 134 mg/dL

## 2015-08-24 LAB — COMPREHENSIVE METABOLIC PANEL
ALT: 22 U/L (ref 9–46)
AST: 28 U/L (ref 10–35)
Albumin: 3.9 g/dL (ref 3.6–5.1)
Alkaline Phosphatase: 74 U/L (ref 40–115)
BUN: 27 mg/dL — ABNORMAL HIGH (ref 7–25)
CO2: 38 mmol/L — ABNORMAL HIGH (ref 20–31)
Calcium: 9.2 mg/dL (ref 8.6–10.3)
Chloride: 93 mmol/L — ABNORMAL LOW (ref 98–110)
Creat: 1.77 mg/dL — ABNORMAL HIGH (ref 0.70–1.18)
Glucose, Bld: 107 mg/dL — ABNORMAL HIGH (ref 65–99)
Potassium: 3.9 mmol/L (ref 3.5–5.3)
Sodium: 141 mmol/L (ref 135–146)
Total Bilirubin: 0.8 mg/dL (ref 0.2–1.2)
Total Protein: 7.5 g/dL (ref 6.1–8.1)

## 2015-08-24 LAB — TSH: TSH: 44.35 mIU/L — ABNORMAL HIGH (ref 0.40–4.50)

## 2015-08-24 LAB — CBC WITH DIFFERENTIAL/PLATELET
Basophils Absolute: 0 cells/uL (ref 0–200)
Basophils Relative: 0 %
Eosinophils Absolute: 84 cells/uL (ref 15–500)
Eosinophils Relative: 1 %
HCT: 41.8 % (ref 38.5–50.0)
Hemoglobin: 13.4 g/dL (ref 13.2–17.1)
Lymphocytes Relative: 12 %
Lymphs Abs: 1008 cells/uL (ref 850–3900)
MCH: 30.5 pg (ref 27.0–33.0)
MCHC: 32.1 g/dL (ref 32.0–36.0)
MCV: 95.2 fL (ref 80.0–100.0)
MPV: 10.3 fL (ref 7.5–12.5)
Monocytes Absolute: 588 cells/uL (ref 200–950)
Monocytes Relative: 7 %
Neutro Abs: 6720 cells/uL (ref 1500–7800)
Neutrophils Relative %: 80 %
Platelets: 253 10*3/uL (ref 140–400)
RBC: 4.39 MIL/uL (ref 4.20–5.80)
RDW: 15.9 % — ABNORMAL HIGH (ref 11.0–15.0)
WBC: 8.4 10*3/uL (ref 3.8–10.8)

## 2015-08-24 LAB — LIPID PANEL
Cholesterol: 163 mg/dL (ref 125–200)
HDL: 83 mg/dL (ref >=40)
LDL Cholesterol: 69 mg/dL (ref <130)
Total CHOL/HDL Ratio: 2 Ratio (ref <=5.0)
Triglycerides: 55 mg/dL (ref <150)
VLDL: 11 mg/dL (ref <30)

## 2015-08-25 ENCOUNTER — Other Ambulatory Visit: Payer: Self-pay | Admitting: *Deleted

## 2015-08-25 MED ORDER — LEVOTHYROXINE SODIUM 75 MCG PO TABS: 75.0000 ug | ORAL_TABLET | Freq: Every day | ORAL | Status: DC

## 2015-08-29 ENCOUNTER — Ambulatory Visit (INDEPENDENT_AMBULATORY_CARE_PROVIDER_SITE_OTHER): Payer: Commercial Managed Care - HMO | Admitting: Internal Medicine

## 2015-08-29 ENCOUNTER — Encounter: Payer: Self-pay | Admitting: Internal Medicine

## 2015-08-29 VITALS — BP 140/70 | HR 74 | Temp 98.0°F | Ht 66.0 in | Wt 241.0 lb

## 2015-08-29 DIAGNOSIS — I10 Essential (primary) hypertension: Secondary | ICD-10-CM

## 2015-08-29 DIAGNOSIS — J449 Chronic obstructive pulmonary disease, unspecified: Secondary | ICD-10-CM

## 2015-08-29 DIAGNOSIS — I483 Typical atrial flutter: Secondary | ICD-10-CM

## 2015-08-29 DIAGNOSIS — E1169 Type 2 diabetes mellitus with other specified complication: Secondary | ICD-10-CM | POA: Diagnosis not present

## 2015-08-29 DIAGNOSIS — E785 Hyperlipidemia, unspecified: Secondary | ICD-10-CM | POA: Diagnosis not present

## 2015-08-29 DIAGNOSIS — Z9989 Dependence on other enabling machines and devices: Secondary | ICD-10-CM

## 2015-08-29 DIAGNOSIS — G4733 Obstructive sleep apnea (adult) (pediatric): Secondary | ICD-10-CM

## 2015-08-29 DIAGNOSIS — I5033 Acute on chronic diastolic (congestive) heart failure: Secondary | ICD-10-CM | POA: Diagnosis not present

## 2015-08-29 DIAGNOSIS — I872 Venous insufficiency (chronic) (peripheral): Secondary | ICD-10-CM | POA: Diagnosis not present

## 2015-08-29 DIAGNOSIS — E032 Hypothyroidism due to medicaments and other exogenous substances: Secondary | ICD-10-CM | POA: Diagnosis not present

## 2015-08-29 DIAGNOSIS — E1121 Type 2 diabetes mellitus with diabetic nephropathy: Secondary | ICD-10-CM | POA: Diagnosis not present

## 2015-08-29 MED ORDER — APIXABAN 5 MG PO TABS: 5.0000 mg | ORAL_TABLET | Freq: Two times a day (BID) | ORAL | 5 refills | Status: DC

## 2015-08-29 NOTE — Patient Instructions (Signed)
Home health will be coming out to check on you.    Continue to wear your compression hose and elevate your feet at rest.  Be sure to check your weight daily.    Come back in 6 weeks to recheck your thyroid and electrolytes.  I gave you samples of eliquis and tradjenta.  I will review your [employer] formulary and get back to you.

## 2015-08-29 NOTE — Progress Notes (Signed)
Location:  [facility] clinic Provider: Ryin Ambrosius L. Mariea Clonts, D.O., C.M.D.  Code Status: full code Goals of Care:  Advanced Directives 08/29/2015  Does patient have an advance directive? No  Copy of advanced directive(s) in chart? -  Would patient like information on creating an advanced directive? No - patient declined information  We have previously discussed the importance of completing advance directives but he has yet to do so.    Chief Complaint  Patient presents with  . Hospitalization Follow-up    pulmonary edema, copd     HPI: Patient is a 75 y.o. male seen today for hospital follow-up s/p admission from 7/13-7/15/17 with increased shortness of breath and fluid retention.  He was found to have acute on chronic diastolic chf.  He had respiratory failure from this.  He initially was treated with bipap therapy and required IV diuresis.  He was weaned off to RA by d/c.  He admitted to noncompliance with his diuretics, daily weights and weight was up to 257 and down to 241 at d/c home.  Repeat echo was essentially unchanged.  D/c summary says pt STILL did not have a scale after numerous interventions with home health and [facility].  He did get one at home and says he is weighing.  Weight stable 241 lbs.    For his COPD, no changes were made.  Re: hypothyroidism, he was not taking his synthroid properly when he saw cardiology and tsh was 40, then was improved to 26 during hospitalization and dose was increased again (just 2 wks later).  He had another tsh pre-visit as was originally scheduled and was worse again at 73.  ?  He remains on his eliquis and beta blocker for aflutter with rate in 60s, but he was cardioverted in the past.  Is out of eliquis.  He is currently taking aspirin instead.  Says he's been out for a week.  It had been filled 05/15/15.    CKDIII was slightly worse at admission but improved with diuresis and was discharged with cr of 1.7.    DMII home meds were continued.   hba1c was  stable at 6.3.    Hyperlipidemia:  LDL was at goal of 69 and HDL excellent at 83.  Remains on pravachol.    He tells me he bought a scale and then asks me what kind of scale he should buy.  He is completely out of eliquis and has been for over a week and is taking asa instead.  He asks if he can take something cheaper for that and for tradjenta.  Past Medical History:  Diagnosis Date  . Allergy   . Asthma   . Atrophic gastritis   . Benign essential hypertension   . COPD (chronic obstructive pulmonary disease) ([place])   . Diabetes ([place])   . GERD (gastroesophageal reflux disease)   . Gout attack 09/2012  . Sinusitis, maxillary, chronic   . Type II or unspecified type diabetes mellitus without mention of complication, not stated as uncontrolled     Past Surgical History:  Procedure Laterality Date  . COLONOSCOPY  2003  . HERNIA REPAIR  1980    No Known Allergies    Medication List       Accurate as of 08/29/15  9:14 AM. Always use your most recent med list.          albuterol (2.5 MG/3ML) 0.083% nebulizer solution Commonly known as:  PROVENTIL INHALE CONTENTS OF 1 VIAL IN NEBULIZER EVERY 4  HOURS AS NEEDED FOR WHEEZING AND ASTHMA   allopurinol 300 MG tablet Commonly known as:  ZYLOPRIM Take 1 tablet (300 mg total) by mouth daily.   amiodarone 200 MG tablet Commonly known as:  PACERONE Take 1 tablet (200 mg total) by mouth 2 (two) times daily.   apixaban 5 MG Tabs tablet Commonly known as:  ELIQUIS Take 1 tablet (5 mg total) by mouth 2 (two) times daily.   CENTRUM SILVER ADULT 50+ PO Take 1 tablet by mouth daily.   levothyroxine 75 MCG tablet Commonly known as:  SYNTHROID, LEVOTHROID Take 1 tablet (75 mcg total) by mouth daily before breakfast.   linagliptin 5 MG Tabs tablet Commonly known as:  TRADJENTA Take 1 tablet (5 mg total) by mouth daily.   meclizine 25 MG tablet Commonly known as:  ANTIVERT Take 1 tablet (25 mg total) by mouth 3 (three) times daily  as needed for dizziness.   pravastatin 20 MG tablet Commonly known as:  PRAVACHOL Take 20 mg by mouth daily.   torsemide 20 MG tablet Commonly known as:  DEMADEX Take 20 mg by mouth 2 (two) times daily. Heart failure       Review of Systems:  Review of Systems  Constitutional: Negative for chills and fever.  HENT: Negative for congestion.   Eyes: Negative for blurred vision.       Glasses  Respiratory: Positive for shortness of breath. Negative for cough, sputum production and wheezing.   Cardiovascular: Positive for leg swelling. Negative for chest pain and palpitations.       Is now wearing his compression hose  Gastrointestinal: Negative for abdominal pain, blood in stool, constipation and melena.  Genitourinary: Negative for dysuria.  Musculoskeletal: Negative for falls and myalgias.  Skin: Negative for itching and rash.  Neurological: Negative for dizziness and loss of consciousness.  Psychiatric/Behavioral: Positive for memory loss. Negative for depression.    Health Maintenance  Topic Date Due  . ZOSTAVAX  05/28/1999  . FOOT EXAM  05/20/2015  . INFLUENZA VACCINE  09/06/2015  . OPHTHALMOLOGY EXAM  09/07/2015  . HEMOGLOBIN A1C  02/24/2016  . URINE MICROALBUMIN  05/29/2016  . TETANUS/TDAP  02/05/2021  . PNA vac Low Risk Adult  Completed    Physical Exam: Vitals:   08/29/15 0858  BP: 140/70  Pulse: 74  Temp: 98 F (36.7 C)  TempSrc: Oral  SpO2: 96%  Weight: 241 lb (109.3 kg)  Height: 5\' 6"  (1.676 m)   Body mass index is 38.9 kg/m. Physical Exam  Constitutional: He is oriented to person, place, and time. He appears well-developed and well-nourished. No distress.  Cardiovascular: Normal rate, regular rhythm, normal heart sounds and intact distal pulses.   Edema bilateral legs, wearing compression hose  Pulmonary/Chest: Effort normal and breath sounds normal. He has no wheezes. He has no rales.  Abdominal: Soft. Bowel sounds are normal.  Musculoskeletal:  Normal range of motion.  Neurological: He is alert and oriented to person, place, and time.  Skin: Skin is warm and dry.  Psychiatric: He has a normal mood and affect.    Labs reviewed: Basic Metabolic Panel:  Recent Labs  12/13/14 0856  07/07/15 1008  08/18/15 0826 08/19/15 0326 08/20/15 0415 08/24/15 0825  NA 142  < >  --   < >  --  140 139 141  K 4.2  < >  --   < >  --  4.1 3.8 3.9  CL 98  < >  --   < >  --  98* 95* 93*  CO2 31  < >  --   < >  --  34* 37* 38*  GLUCOSE 84  < >  --   < >  --  105* 92 107*  BUN 24  < >  --   < >  --  28* 41* 27*  CREATININE 1.36*  < >  --   < >  --  1.49* 1.71* 1.77*  CALCIUM 9.0  < >  --   < >  --  8.5* 8.7* 9.2  MG 1.8  --   --   --   --   --   --   --   TSH 3.535  < > 40.40*  --  26.752*  --   --  44.35*  < > = values in this interval not displayed. Liver Function Tests:  Recent Labs  04/18/15 1010 08/18/15 0413 08/24/15 0825  AST 26 49* 28  ALT 22 27 22   ALKPHOS 48 76 74  BILITOT 0.7 0.7 0.8  PROT 7.0 7.9 7.5  ALBUMIN 4.0 4.0 3.9   No results for input(s): LIPASE, AMYLASE in the last 8760 hours. No results for input(s): AMMONIA in the last 8760 hours. CBC:  Recent Labs  02/25/15 0949  08/18/15 0413 08/20/15 0415 08/24/15 0825  WBC 4.5  < > 6.5 8.0 8.4  NEUTROABS 2.5  --  4.9  --  6,720  HGB  --   < > 11.9* 12.4* 13.4  HCT 39.8  < > 38.1* 40.9 41.8  MCV 92  < > 98.7 98.6 95.2  PLT 201  < > 238 274 253  < > = values in this interval not displayed. Lipid Panel:  Recent Labs  02/25/15 0949 05/25/15 0950 08/24/15 0825  CHOL 171 151 163  HDL 65 68 83  LDLCALC 95 68 69  TRIG 55 76 55  CHOLHDL 2.6 2.2 2.0   Lab Results  Component Value Date   HGBA1C 6.3 (H) 08/24/2015    Procedures since last visit: Dg Chest Port 1 View  Result Date: 08/18/2015 CLINICAL DATA:  Worsening shortness of breath for week. History of COPD, diabetes. EXAM: PORTABLE CHEST 1 VIEW COMPARISON:  Chest radiograph April 20, 2015 FINDINGS:  The cardiac silhouette appears moderately enlarged, potentially accentuated by AP technique. Tortuous aorta with calcified aortic knob. Pulmonary vascular congestion and diffuse interstitial prominence without pleural effusion or focal consolidation. No pneumothorax. Large body habitus. Osseous structures are nonsuspicious. IMPRESSION: Increasing cardiomegaly with interstitial edema. Electronically Signed   By: Elon Alas M.D.   On: 08/18/2015 04:21    Assessment/Plan 1. Acute on chronic diastolic CHF (congestive heart failure), NYHA class 2 ([place]) -resolved, wt still at discharge weight today and no signs of acute flare now - cont current torsemide -start doing daily weights -home health nursing for education and monitoring of CHF and PT for deconditioning   2. COPD, moderate ([place]) -cont cont albuterol nebs an monitor -has historically had difficulty using inhalers properly   3. Controlled type 2 diabetes mellitus with diabetic nephropathy, without long-term current use of insulin ([place]) -continues on tradjenta only--full 4 wks of samples given today -I have pulled up his formulary to review which meds are better covered -renal function is too poor to use metformin and he is not consistent with his input on a schedule so avoid sulfonylureas that cause hypoglycemia  4. Typical atrial flutter ([facility]) -continues on amiodarone and eliquis (cost is an issue) -also given  4 wks of samples of eliquis and new Rx called in as he was entirely out despite extensive counseling not to run out of his meds and to call our office with any questions  5. OSA on CPAP -non-compliant for periods of time with CPAP also -cont at hs  6. Hyperlipidemia associated with type 2 diabetes mellitus ([facility]) -cont current pravachol  7. Essential hypertension, benign -bp at goal today on only his diuretic therapy  8. Hypothyroidism due to medication -cont synthroid 72mcg and recheck tsh in 6 wks  9.  Venous  insufficiency -cont compression hose ongoing   Labs/tests ordered:  Orders Placed This Encounter  Procedures  . Basic metabolic panel    Standing Status:   Future    Standing Expiration Date:   11/29/2015  . TSH    Standing Status:   Future    Standing Expiration Date:   11/29/2015  . Ambulatory referral to Home Health    Referral Priority:   Routine    Referral Type:   Home Health Care    Referral Reason:   Specialty Services Required    Requested Specialty:   [place]    Number of Visits Requested:   1   Next appt:  10/13/2015  Juliza Machnik L. Cherylanne Ardelean, D.O. [facility] [address], [place] [address] Cell Phone (Mon-Fri 8am-5pm):  [phone] On Call:  [phone] & follow prompts after 5pm & weekends Office Phone:  [phone] Office Fax:  [phone]

## 2015-08-30 ENCOUNTER — Encounter: Payer: Self-pay | Admitting: Internal Medicine

## 2015-08-31 DIAGNOSIS — N183 Chronic kidney disease, stage 3 (moderate): Secondary | ICD-10-CM | POA: Diagnosis not present

## 2015-08-31 DIAGNOSIS — I483 Typical atrial flutter: Secondary | ICD-10-CM | POA: Diagnosis not present

## 2015-08-31 DIAGNOSIS — M6281 Muscle weakness (generalized): Secondary | ICD-10-CM | POA: Diagnosis not present

## 2015-08-31 DIAGNOSIS — E1169 Type 2 diabetes mellitus with other specified complication: Secondary | ICD-10-CM | POA: Diagnosis not present

## 2015-08-31 DIAGNOSIS — I5033 Acute on chronic diastolic (congestive) heart failure: Secondary | ICD-10-CM | POA: Diagnosis not present

## 2015-08-31 DIAGNOSIS — J449 Chronic obstructive pulmonary disease, unspecified: Secondary | ICD-10-CM | POA: Diagnosis not present

## 2015-08-31 DIAGNOSIS — I130 Hypertensive heart and chronic kidney disease with heart failure and stage 1 through stage 4 chronic kidney disease, or unspecified chronic kidney disease: Secondary | ICD-10-CM | POA: Diagnosis not present

## 2015-08-31 DIAGNOSIS — E785 Hyperlipidemia, unspecified: Secondary | ICD-10-CM | POA: Diagnosis not present

## 2015-08-31 DIAGNOSIS — E1122 Type 2 diabetes mellitus with diabetic chronic kidney disease: Secondary | ICD-10-CM | POA: Diagnosis not present

## 2015-09-05 DIAGNOSIS — N183 Chronic kidney disease, stage 3 (moderate): Secondary | ICD-10-CM | POA: Diagnosis not present

## 2015-09-05 DIAGNOSIS — I130 Hypertensive heart and chronic kidney disease with heart failure and stage 1 through stage 4 chronic kidney disease, or unspecified chronic kidney disease: Secondary | ICD-10-CM | POA: Diagnosis not present

## 2015-09-05 DIAGNOSIS — E1122 Type 2 diabetes mellitus with diabetic chronic kidney disease: Secondary | ICD-10-CM | POA: Diagnosis not present

## 2015-09-05 DIAGNOSIS — E785 Hyperlipidemia, unspecified: Secondary | ICD-10-CM | POA: Diagnosis not present

## 2015-09-05 DIAGNOSIS — I483 Typical atrial flutter: Secondary | ICD-10-CM | POA: Diagnosis not present

## 2015-09-05 DIAGNOSIS — M6281 Muscle weakness (generalized): Secondary | ICD-10-CM | POA: Diagnosis not present

## 2015-09-05 DIAGNOSIS — J449 Chronic obstructive pulmonary disease, unspecified: Secondary | ICD-10-CM | POA: Diagnosis not present

## 2015-09-05 DIAGNOSIS — E1169 Type 2 diabetes mellitus with other specified complication: Secondary | ICD-10-CM | POA: Diagnosis not present

## 2015-09-05 DIAGNOSIS — I5033 Acute on chronic diastolic (congestive) heart failure: Secondary | ICD-10-CM | POA: Diagnosis not present

## 2015-09-08 DIAGNOSIS — I130 Hypertensive heart and chronic kidney disease with heart failure and stage 1 through stage 4 chronic kidney disease, or unspecified chronic kidney disease: Secondary | ICD-10-CM | POA: Diagnosis not present

## 2015-09-08 DIAGNOSIS — M6281 Muscle weakness (generalized): Secondary | ICD-10-CM | POA: Diagnosis not present

## 2015-09-08 DIAGNOSIS — E785 Hyperlipidemia, unspecified: Secondary | ICD-10-CM | POA: Diagnosis not present

## 2015-09-08 DIAGNOSIS — E1122 Type 2 diabetes mellitus with diabetic chronic kidney disease: Secondary | ICD-10-CM | POA: Diagnosis not present

## 2015-09-08 DIAGNOSIS — J449 Chronic obstructive pulmonary disease, unspecified: Secondary | ICD-10-CM | POA: Diagnosis not present

## 2015-09-08 DIAGNOSIS — I483 Typical atrial flutter: Secondary | ICD-10-CM | POA: Diagnosis not present

## 2015-09-08 DIAGNOSIS — E1169 Type 2 diabetes mellitus with other specified complication: Secondary | ICD-10-CM | POA: Diagnosis not present

## 2015-09-08 DIAGNOSIS — N183 Chronic kidney disease, stage 3 (moderate): Secondary | ICD-10-CM | POA: Diagnosis not present

## 2015-09-08 DIAGNOSIS — I5033 Acute on chronic diastolic (congestive) heart failure: Secondary | ICD-10-CM | POA: Diagnosis not present

## 2015-09-09 DIAGNOSIS — E1122 Type 2 diabetes mellitus with diabetic chronic kidney disease: Secondary | ICD-10-CM | POA: Diagnosis not present

## 2015-09-09 DIAGNOSIS — N183 Chronic kidney disease, stage 3 (moderate): Secondary | ICD-10-CM | POA: Diagnosis not present

## 2015-09-09 DIAGNOSIS — E1169 Type 2 diabetes mellitus with other specified complication: Secondary | ICD-10-CM | POA: Diagnosis not present

## 2015-09-09 DIAGNOSIS — I5033 Acute on chronic diastolic (congestive) heart failure: Secondary | ICD-10-CM | POA: Diagnosis not present

## 2015-09-09 DIAGNOSIS — M6281 Muscle weakness (generalized): Secondary | ICD-10-CM | POA: Diagnosis not present

## 2015-09-09 DIAGNOSIS — I483 Typical atrial flutter: Secondary | ICD-10-CM | POA: Diagnosis not present

## 2015-09-09 DIAGNOSIS — E785 Hyperlipidemia, unspecified: Secondary | ICD-10-CM | POA: Diagnosis not present

## 2015-09-09 DIAGNOSIS — J449 Chronic obstructive pulmonary disease, unspecified: Secondary | ICD-10-CM | POA: Diagnosis not present

## 2015-09-09 DIAGNOSIS — I130 Hypertensive heart and chronic kidney disease with heart failure and stage 1 through stage 4 chronic kidney disease, or unspecified chronic kidney disease: Secondary | ICD-10-CM | POA: Diagnosis not present

## 2015-09-13 DIAGNOSIS — E1169 Type 2 diabetes mellitus with other specified complication: Secondary | ICD-10-CM | POA: Diagnosis not present

## 2015-09-13 DIAGNOSIS — N183 Chronic kidney disease, stage 3 (moderate): Secondary | ICD-10-CM | POA: Diagnosis not present

## 2015-09-13 DIAGNOSIS — E785 Hyperlipidemia, unspecified: Secondary | ICD-10-CM | POA: Diagnosis not present

## 2015-09-13 DIAGNOSIS — E1122 Type 2 diabetes mellitus with diabetic chronic kidney disease: Secondary | ICD-10-CM | POA: Diagnosis not present

## 2015-09-13 DIAGNOSIS — M6281 Muscle weakness (generalized): Secondary | ICD-10-CM | POA: Diagnosis not present

## 2015-09-13 DIAGNOSIS — I5033 Acute on chronic diastolic (congestive) heart failure: Secondary | ICD-10-CM | POA: Diagnosis not present

## 2015-09-13 DIAGNOSIS — J449 Chronic obstructive pulmonary disease, unspecified: Secondary | ICD-10-CM | POA: Diagnosis not present

## 2015-09-13 DIAGNOSIS — I130 Hypertensive heart and chronic kidney disease with heart failure and stage 1 through stage 4 chronic kidney disease, or unspecified chronic kidney disease: Secondary | ICD-10-CM | POA: Diagnosis not present

## 2015-09-13 DIAGNOSIS — I483 Typical atrial flutter: Secondary | ICD-10-CM | POA: Diagnosis not present

## 2015-09-15 DIAGNOSIS — I130 Hypertensive heart and chronic kidney disease with heart failure and stage 1 through stage 4 chronic kidney disease, or unspecified chronic kidney disease: Secondary | ICD-10-CM | POA: Diagnosis not present

## 2015-09-15 DIAGNOSIS — E119 Type 2 diabetes mellitus without complications: Secondary | ICD-10-CM | POA: Diagnosis not present

## 2015-09-15 DIAGNOSIS — J449 Chronic obstructive pulmonary disease, unspecified: Secondary | ICD-10-CM | POA: Diagnosis not present

## 2015-09-15 DIAGNOSIS — E1169 Type 2 diabetes mellitus with other specified complication: Secondary | ICD-10-CM | POA: Diagnosis not present

## 2015-09-15 DIAGNOSIS — I5033 Acute on chronic diastolic (congestive) heart failure: Secondary | ICD-10-CM | POA: Diagnosis not present

## 2015-09-15 DIAGNOSIS — H2513 Age-related nuclear cataract, bilateral: Secondary | ICD-10-CM | POA: Diagnosis not present

## 2015-09-15 DIAGNOSIS — E785 Hyperlipidemia, unspecified: Secondary | ICD-10-CM | POA: Diagnosis not present

## 2015-09-15 DIAGNOSIS — N183 Chronic kidney disease, stage 3 (moderate): Secondary | ICD-10-CM | POA: Diagnosis not present

## 2015-09-15 DIAGNOSIS — H40013 Open angle with borderline findings, low risk, bilateral: Secondary | ICD-10-CM | POA: Diagnosis not present

## 2015-09-15 DIAGNOSIS — E1122 Type 2 diabetes mellitus with diabetic chronic kidney disease: Secondary | ICD-10-CM | POA: Diagnosis not present

## 2015-09-15 DIAGNOSIS — M6281 Muscle weakness (generalized): Secondary | ICD-10-CM | POA: Diagnosis not present

## 2015-09-15 DIAGNOSIS — I483 Typical atrial flutter: Secondary | ICD-10-CM | POA: Diagnosis not present

## 2015-09-19 DIAGNOSIS — E785 Hyperlipidemia, unspecified: Secondary | ICD-10-CM | POA: Diagnosis not present

## 2015-09-19 DIAGNOSIS — N183 Chronic kidney disease, stage 3 (moderate): Secondary | ICD-10-CM | POA: Diagnosis not present

## 2015-09-19 DIAGNOSIS — I5033 Acute on chronic diastolic (congestive) heart failure: Secondary | ICD-10-CM | POA: Diagnosis not present

## 2015-09-19 DIAGNOSIS — E1122 Type 2 diabetes mellitus with diabetic chronic kidney disease: Secondary | ICD-10-CM | POA: Diagnosis not present

## 2015-09-19 DIAGNOSIS — J449 Chronic obstructive pulmonary disease, unspecified: Secondary | ICD-10-CM | POA: Diagnosis not present

## 2015-09-19 DIAGNOSIS — M6281 Muscle weakness (generalized): Secondary | ICD-10-CM | POA: Diagnosis not present

## 2015-09-19 DIAGNOSIS — I130 Hypertensive heart and chronic kidney disease with heart failure and stage 1 through stage 4 chronic kidney disease, or unspecified chronic kidney disease: Secondary | ICD-10-CM | POA: Diagnosis not present

## 2015-09-19 DIAGNOSIS — E1169 Type 2 diabetes mellitus with other specified complication: Secondary | ICD-10-CM | POA: Diagnosis not present

## 2015-09-19 DIAGNOSIS — I483 Typical atrial flutter: Secondary | ICD-10-CM | POA: Diagnosis not present

## 2015-09-20 DIAGNOSIS — M6281 Muscle weakness (generalized): Secondary | ICD-10-CM | POA: Diagnosis not present

## 2015-09-20 DIAGNOSIS — I130 Hypertensive heart and chronic kidney disease with heart failure and stage 1 through stage 4 chronic kidney disease, or unspecified chronic kidney disease: Secondary | ICD-10-CM | POA: Diagnosis not present

## 2015-09-20 DIAGNOSIS — E785 Hyperlipidemia, unspecified: Secondary | ICD-10-CM | POA: Diagnosis not present

## 2015-09-20 DIAGNOSIS — N183 Chronic kidney disease, stage 3 (moderate): Secondary | ICD-10-CM | POA: Diagnosis not present

## 2015-09-20 DIAGNOSIS — J449 Chronic obstructive pulmonary disease, unspecified: Secondary | ICD-10-CM | POA: Diagnosis not present

## 2015-09-20 DIAGNOSIS — I483 Typical atrial flutter: Secondary | ICD-10-CM | POA: Diagnosis not present

## 2015-09-20 DIAGNOSIS — I5033 Acute on chronic diastolic (congestive) heart failure: Secondary | ICD-10-CM | POA: Diagnosis not present

## 2015-09-20 DIAGNOSIS — E1169 Type 2 diabetes mellitus with other specified complication: Secondary | ICD-10-CM | POA: Diagnosis not present

## 2015-09-20 DIAGNOSIS — E1122 Type 2 diabetes mellitus with diabetic chronic kidney disease: Secondary | ICD-10-CM | POA: Diagnosis not present

## 2015-09-21 DIAGNOSIS — M6281 Muscle weakness (generalized): Secondary | ICD-10-CM | POA: Diagnosis not present

## 2015-09-21 DIAGNOSIS — E1169 Type 2 diabetes mellitus with other specified complication: Secondary | ICD-10-CM | POA: Diagnosis not present

## 2015-09-21 DIAGNOSIS — N183 Chronic kidney disease, stage 3 (moderate): Secondary | ICD-10-CM | POA: Diagnosis not present

## 2015-09-21 DIAGNOSIS — E1122 Type 2 diabetes mellitus with diabetic chronic kidney disease: Secondary | ICD-10-CM | POA: Diagnosis not present

## 2015-09-21 DIAGNOSIS — I5033 Acute on chronic diastolic (congestive) heart failure: Secondary | ICD-10-CM | POA: Diagnosis not present

## 2015-09-21 DIAGNOSIS — I130 Hypertensive heart and chronic kidney disease with heart failure and stage 1 through stage 4 chronic kidney disease, or unspecified chronic kidney disease: Secondary | ICD-10-CM | POA: Diagnosis not present

## 2015-09-21 DIAGNOSIS — I483 Typical atrial flutter: Secondary | ICD-10-CM | POA: Diagnosis not present

## 2015-09-21 DIAGNOSIS — J449 Chronic obstructive pulmonary disease, unspecified: Secondary | ICD-10-CM | POA: Diagnosis not present

## 2015-09-21 DIAGNOSIS — E785 Hyperlipidemia, unspecified: Secondary | ICD-10-CM | POA: Diagnosis not present

## 2015-09-24 ENCOUNTER — Other Ambulatory Visit: Payer: Self-pay | Admitting: Internal Medicine

## 2015-09-26 DIAGNOSIS — I5033 Acute on chronic diastolic (congestive) heart failure: Secondary | ICD-10-CM | POA: Diagnosis not present

## 2015-09-26 DIAGNOSIS — N183 Chronic kidney disease, stage 3 (moderate): Secondary | ICD-10-CM | POA: Diagnosis not present

## 2015-09-26 DIAGNOSIS — I483 Typical atrial flutter: Secondary | ICD-10-CM | POA: Diagnosis not present

## 2015-09-26 DIAGNOSIS — M6281 Muscle weakness (generalized): Secondary | ICD-10-CM | POA: Diagnosis not present

## 2015-09-26 DIAGNOSIS — E1122 Type 2 diabetes mellitus with diabetic chronic kidney disease: Secondary | ICD-10-CM | POA: Diagnosis not present

## 2015-09-26 DIAGNOSIS — E1169 Type 2 diabetes mellitus with other specified complication: Secondary | ICD-10-CM | POA: Diagnosis not present

## 2015-09-26 DIAGNOSIS — I130 Hypertensive heart and chronic kidney disease with heart failure and stage 1 through stage 4 chronic kidney disease, or unspecified chronic kidney disease: Secondary | ICD-10-CM | POA: Diagnosis not present

## 2015-09-26 DIAGNOSIS — E785 Hyperlipidemia, unspecified: Secondary | ICD-10-CM | POA: Diagnosis not present

## 2015-09-26 DIAGNOSIS — J449 Chronic obstructive pulmonary disease, unspecified: Secondary | ICD-10-CM | POA: Diagnosis not present

## 2015-09-27 ENCOUNTER — Other Ambulatory Visit: Payer: Self-pay | Admitting: *Deleted

## 2015-09-27 MED ORDER — MONTELUKAST SODIUM 10 MG PO TABS: ORAL_TABLET | ORAL | 0 refills | Status: DC

## 2015-09-27 NOTE — Telephone Encounter (Signed)
[facility]. Ok per [place]son to refill

## 2015-09-28 ENCOUNTER — Telehealth: Payer: Self-pay

## 2015-09-28 NOTE — Telephone Encounter (Signed)
Refer to phone note dated 06/16/15, patient stopped by the office today to drop off Prescription Drug Guide (formulary) for Dr.[name] to review and advise on an alternative to [place].  Formulary placed on ledge for review

## 2015-09-29 ENCOUNTER — Telehealth: Payer: Self-pay

## 2015-09-29 DIAGNOSIS — M6281 Muscle weakness (generalized): Secondary | ICD-10-CM | POA: Diagnosis not present

## 2015-09-29 DIAGNOSIS — I5033 Acute on chronic diastolic (congestive) heart failure: Secondary | ICD-10-CM | POA: Diagnosis not present

## 2015-09-29 DIAGNOSIS — I483 Typical atrial flutter: Secondary | ICD-10-CM | POA: Diagnosis not present

## 2015-09-29 DIAGNOSIS — E1169 Type 2 diabetes mellitus with other specified complication: Secondary | ICD-10-CM | POA: Diagnosis not present

## 2015-09-29 DIAGNOSIS — E1122 Type 2 diabetes mellitus with diabetic chronic kidney disease: Secondary | ICD-10-CM | POA: Diagnosis not present

## 2015-09-29 DIAGNOSIS — N183 Chronic kidney disease, stage 3 (moderate): Secondary | ICD-10-CM | POA: Diagnosis not present

## 2015-09-29 DIAGNOSIS — I130 Hypertensive heart and chronic kidney disease with heart failure and stage 1 through stage 4 chronic kidney disease, or unspecified chronic kidney disease: Secondary | ICD-10-CM | POA: Diagnosis not present

## 2015-09-29 DIAGNOSIS — J449 Chronic obstructive pulmonary disease, unspecified: Secondary | ICD-10-CM | POA: Diagnosis not present

## 2015-09-29 DIAGNOSIS — E785 Hyperlipidemia, unspecified: Secondary | ICD-10-CM | POA: Diagnosis not present

## 2015-09-29 MED ORDER — APIXABAN 5 MG PO TABS: 5.0000 mg | ORAL_TABLET | Freq: Two times a day (BID) | ORAL | 5 refills | Status: DC

## 2015-09-29 NOTE — Telephone Encounter (Signed)
Omar Lambert from [facility] called to inform Dr.[name] of weight gain. Last week Omar Lambert weighed in at 236 lbs and today Omar Lambert weighed in at 245 (9 lbs up). Omar Lambert's other vitals are stable: 97.7 (o), 68 bpm, 18 resp, 130/70. Omar Lambert denies pain or SOB at rest. Omar Lambert with slight SOB while walking. O2 saturation 94-97 %  Please review and advise

## 2015-09-29 NOTE — Telephone Encounter (Signed)
Spoke with patient, patient states he has medication on hand. Patient is taking medication two times daily.  Patient states he was at a family reunion x 4 days and had a lot of food and this may be the source of his weight gain.  Gwinda Passe confirmed patient's story about family reunion.  Please advise

## 2015-09-29 NOTE — Telephone Encounter (Signed)
Is he taking his torsemide 20mg  by mouth twice daily?  I suspect he ran out and didn't refill it (this is a recurrent problem of his).  Can you please check with Gwinda Passe?

## 2015-09-30 ENCOUNTER — Encounter: Payer: Self-pay | Admitting: Internal Medicine

## 2015-09-30 ENCOUNTER — Ambulatory Visit (INDEPENDENT_AMBULATORY_CARE_PROVIDER_SITE_OTHER): Payer: Commercial Managed Care - HMO | Admitting: Internal Medicine

## 2015-09-30 VITALS — BP 110/70 | HR 72 | Temp 97.7°F | Wt 247.0 lb

## 2015-09-30 DIAGNOSIS — I5033 Acute on chronic diastolic (congestive) heart failure: Secondary | ICD-10-CM | POA: Diagnosis not present

## 2015-09-30 DIAGNOSIS — E1121 Type 2 diabetes mellitus with diabetic nephropathy: Secondary | ICD-10-CM | POA: Diagnosis not present

## 2015-09-30 DIAGNOSIS — Z598 Other problems related to housing and economic circumstances: Secondary | ICD-10-CM | POA: Diagnosis not present

## 2015-09-30 DIAGNOSIS — Z599 Problem related to housing and economic circumstances, unspecified: Secondary | ICD-10-CM

## 2015-09-30 MED ORDER — LINAGLIPTIN 5 MG PO TABS: 5.0000 mg | ORAL_TABLET | Freq: Every day | ORAL | 3 refills | Status: DC

## 2015-09-30 MED ORDER — TORSEMIDE 20 MG PO TABS: 20.0000 mg | ORAL_TABLET | Freq: Two times a day (BID) | ORAL | 3 refills | Status: DC

## 2015-09-30 NOTE — Telephone Encounter (Signed)
I'd like him to come in this am for an acute visit.   I'm concerned he's having a heart failure exacerbation with that much weight gain that quickly.  I suspect he ate salty foods more than usual not just more food.  He will need labs and we'll have to check his weight, sats and I'll check his lungs and feet for fluid.

## 2015-09-30 NOTE — Telephone Encounter (Signed)
This was addressed at his appointment this morning.  Please refer to that note.

## 2015-09-30 NOTE — Telephone Encounter (Signed)
Spoke with patient, scheduled appointment for today

## 2015-09-30 NOTE — Patient Instructions (Signed)
Please take 2 of your torsemide twice a day for 3 days for your weight gain. Please avoid salty foods, elevate your legs at rest and continue to use your compression socks.

## 2015-09-30 NOTE — Progress Notes (Signed)
Location:  [facility] clinic Provider: Kayon Dozier L. Mariea Clonts, D.O., C.M.D.  Goals of Care:  Advanced Directives 09/30/2015  Does patient have an advance directive? No  Copy of advanced directive(s) in chart? -  Would patient like information on creating an advanced directive? -   Chief Complaint  Patient presents with  . Acute Visit    weight gain    HPI: Patient is a 75 y.o. male seen today for an acute visit for weight gain.  We were informed by home health that weight had increased.  He reports 4 days of eating poorly due to attending a family reunion--he's been eating bacon, smoked sausage, highly seasoned fish, desserts.  His wife reports he went all out.  He'd been under control at the house.    He is up 6 lbs here from his last weight of 241 lbs on 08/29/15.  He is more short of breath and his abdomen is more distended. Edema stable with compression hose.  No palpitations, orthopnea, pnd.  He also cannot afford tradjenta.  Looking at his formulary the only things on tier 1 or 2 are contraindicated at his renal function and even insulins are on tier 3 for him.    Past Medical History:  Diagnosis Date  . Allergy   . Asthma   . Atrophic gastritis   . Benign essential hypertension   . COPD (chronic obstructive pulmonary disease) ([place])   . Diabetes ([place])   . GERD (gastroesophageal reflux disease)   . Gout attack 09/2012  . Sinusitis, maxillary, chronic   . Type II or unspecified type diabetes mellitus without mention of complication, not stated as uncontrolled     Past Surgical History:  Procedure Laterality Date  . COLONOSCOPY  2003  . HERNIA REPAIR  1980    No Known Allergies    Medication List       Accurate as of 09/30/15  9:23 AM. Always use your most recent med list.          albuterol (2.5 MG/3ML) 0.083% nebulizer solution Commonly known as:  PROVENTIL INHALE CONTENTS OF 1 VIAL IN NEBULIZER EVERY 4 HOURS AS NEEDED FOR WHEEZING AND ASTHMA   allopurinol 300 MG  tablet Commonly known as:  ZYLOPRIM Take 1 tablet (300 mg total) by mouth daily.   amiodarone 200 MG tablet Commonly known as:  PACERONE Take 1 tablet (200 mg total) by mouth 2 (two) times daily.   apixaban 5 MG Tabs tablet Commonly known as:  ELIQUIS Take 1 tablet (5 mg total) by mouth 2 (two) times daily.   CENTRUM SILVER ADULT 50+ PO Take 1 tablet by mouth daily.   levothyroxine 75 MCG tablet Commonly known as:  SYNTHROID, LEVOTHROID Take 1 tablet (75 mcg total) by mouth daily before breakfast.   linagliptin 5 MG Tabs tablet Commonly known as:  TRADJENTA Take 1 tablet (5 mg total) by mouth daily.   meclizine 25 MG tablet Commonly known as:  ANTIVERT Take 1 tablet (25 mg total) by mouth 3 (three) times daily as needed for dizziness.   montelukast 10 MG tablet Commonly known as:  SINGULAIR Take one tablet by mouth at bedtime as needed for allergies and asthma   pravastatin 20 MG tablet Commonly known as:  PRAVACHOL TAKE 1 TABLET BY MOUTH EVERY DAY WITH SUPPER   torsemide 20 MG tablet Commonly known as:  DEMADEX Take 20 mg by mouth 2 (two) times daily. Heart failure       Review of Systems:  Review of Systems  Constitutional: Negative for chills and fever.       6 lb weight gain  Eyes: Negative for blurred vision.  Respiratory: Positive for shortness of breath. Negative for cough and wheezing.   Cardiovascular: Negative for chest pain, palpitations, orthopnea and leg swelling.  Gastrointestinal:       Increased abdominal distention  Musculoskeletal: Negative for falls.  Skin: Negative for itching and rash.    Health Maintenance  Topic Date Due  . ZOSTAVAX  05/28/1999  . FOOT EXAM  05/20/2015  . INFLUENZA VACCINE  09/06/2015  . OPHTHALMOLOGY EXAM  09/07/2015  . HEMOGLOBIN A1C  02/24/2016  . URINE MICROALBUMIN  05/29/2016  . TETANUS/TDAP  02/05/2021  . PNA vac Low Risk Adult  Completed    Physical Exam: Vitals:   09/30/15 0914  BP: 110/70  Pulse:  72  Temp: 97.7 F (36.5 C)  TempSrc: Oral  SpO2: 95%  Weight: 247 lb (112 kg)   Body mass index is 39.87 kg/m. Physical Exam  Constitutional: He is oriented to person, place, and time. He appears well-developed and well-nourished. No distress.  Neck: No JVD present.  Cardiovascular: Intact distal pulses.   irreg irreg; is wearing compression hose  Pulmonary/Chest: Effort normal and breath sounds normal. He has no rales.  Abdominal: Soft. Bowel sounds are normal.  Increased abdominal fluid  Musculoskeletal: Normal range of motion.  Neurological: He is alert and oriented to person, place, and time.  Skin: Skin is warm and dry.    Labs reviewed: Basic Metabolic Panel:  Recent Labs  12/13/14 0856  07/07/15 1008  08/18/15 0826 08/19/15 0326 08/20/15 0415 08/24/15 0825  NA 142  < >  --   < >  --  140 139 141  K 4.2  < >  --   < >  --  4.1 3.8 3.9  CL 98  < >  --   < >  --  98* 95* 93*  CO2 31  < >  --   < >  --  34* 37* 38*  GLUCOSE 84  < >  --   < >  --  105* 92 107*  BUN 24  < >  --   < >  --  28* 41* 27*  CREATININE 1.36*  < >  --   < >  --  1.49* 1.71* 1.77*  CALCIUM 9.0  < >  --   < >  --  8.5* 8.7* 9.2  MG 1.8  --   --   --   --   --   --   --   TSH 3.535  < > 40.40*  --  26.752*  --   --  44.35*  < > = values in this interval not displayed. Liver Function Tests:  Recent Labs  04/18/15 1010 08/18/15 0413 08/24/15 0825  AST 26 49* 28  ALT 22 27 22   ALKPHOS 48 76 74  BILITOT 0.7 0.7 0.8  PROT 7.0 7.9 7.5  ALBUMIN 4.0 4.0 3.9   No results for input(s): LIPASE, AMYLASE in the last 8760 hours. No results for input(s): AMMONIA in the last 8760 hours. CBC:  Recent Labs  02/25/15 0949  08/18/15 0413 08/20/15 0415 08/24/15 0825  WBC 4.5  < > 6.5 8.0 8.4  NEUTROABS 2.5  --  4.9  --  6,720  HGB  --   < > 11.9* 12.4* 13.4  HCT 39.8  < > 38.1* 40.9  41.8  MCV 92  < > 98.7 98.6 95.2  PLT 201  < > 238 274 253  < > = values in this interval not  displayed. Lipid Panel:  Recent Labs  02/25/15 0949 05/25/15 0950 08/24/15 0825  CHOL 171 151 163  HDL 65 68 83  LDLCALC 95 68 69  TRIG 55 76 55  CHOLHDL 2.6 2.2 2.0   Lab Results  Component Value Date   HGBA1C 6.3 (H) 08/24/2015     Assessment/Plan 1. Acute on chronic diastolic CHF (congestive heart failure), NYHA class 2 (HCC) -6 lb weight gain after 4 days of eating whatever he wanted at the family reunion -he is more short of breath and abdomen more distended, no edema or rales -advised to take 2 torsemide twice a day for 3 days then return to one twice a day as usual -avoid high sodium foods   2. Controlled type 2 diabetes mellitus with diabetic nephropathy, without long-term current use of insulin (HCC) -control has been poor due to difficulty paying for meds -his tradjenta is more expensive now (? 90 day vs 30 day)  -we filled in as much info as possible during his appt on the application for assistance with his tradjenta--he needs to fill in what he makes in social security and the value of his home/assets and provide copies of his taxes and ss statements -he and his wife plan to return the forms to the office Monday for faxing to the [facility] program -he was given 3 wks of samples of tradjenta in the meantime -problem is that there are no agents safe for him on a lower tier of his formulary (only metformin and sulfonylureas are on there on tier 1and2)   -the tradjenta and [place] are both on tier 3 along with most of the newer agents  -insulin will be a big challenge for him and require a ton of education b/c he has difficulty taking his pills correctly  3. Financial difficulty -see #2 -he has also not tried to apply for extra help thru medicare--? If he'd qualify for that either -advised that they use his med list to determine the best part D plan for next year and his wife was going to contact the lady that helped them select humana last year  Labs/tests ordered:  No  orders of the defined types were placed in this encounter.  Next appt:  10/13/2015  Bettyann Birchler L. Labria Wos, D.O. [facility] [address], [place] [address] Cell Phone (Mon-Fri 8am-5pm):  [phone] On Call:  [phone] & follow prompts after 5pm & weekends Office Phone:  [phone] Office Fax:  [phone]

## 2015-10-03 DIAGNOSIS — M6281 Muscle weakness (generalized): Secondary | ICD-10-CM | POA: Diagnosis not present

## 2015-10-03 DIAGNOSIS — E1169 Type 2 diabetes mellitus with other specified complication: Secondary | ICD-10-CM | POA: Diagnosis not present

## 2015-10-03 DIAGNOSIS — I130 Hypertensive heart and chronic kidney disease with heart failure and stage 1 through stage 4 chronic kidney disease, or unspecified chronic kidney disease: Secondary | ICD-10-CM | POA: Diagnosis not present

## 2015-10-03 DIAGNOSIS — N183 Chronic kidney disease, stage 3 (moderate): Secondary | ICD-10-CM | POA: Diagnosis not present

## 2015-10-03 DIAGNOSIS — E1122 Type 2 diabetes mellitus with diabetic chronic kidney disease: Secondary | ICD-10-CM | POA: Diagnosis not present

## 2015-10-03 DIAGNOSIS — I5033 Acute on chronic diastolic (congestive) heart failure: Secondary | ICD-10-CM | POA: Diagnosis not present

## 2015-10-03 DIAGNOSIS — I483 Typical atrial flutter: Secondary | ICD-10-CM | POA: Diagnosis not present

## 2015-10-03 DIAGNOSIS — E785 Hyperlipidemia, unspecified: Secondary | ICD-10-CM | POA: Diagnosis not present

## 2015-10-03 DIAGNOSIS — J449 Chronic obstructive pulmonary disease, unspecified: Secondary | ICD-10-CM | POA: Diagnosis not present

## 2015-10-04 ENCOUNTER — Telehealth: Payer: Self-pay | Admitting: *Deleted

## 2015-10-04 NOTE — Telephone Encounter (Signed)
Proof of Income dropped off. Faxed forms to [employer].

## 2015-10-04 NOTE — Telephone Encounter (Signed)
Patient dropped off patient assistance forms for [employer] for [employer]. [phone] Fax: [phone]  Needs Proof of Income. Patient will drop off. Patient does not have tax return because he states he does not file.

## 2015-10-06 ENCOUNTER — Other Ambulatory Visit: Payer: Self-pay | Admitting: Internal Medicine

## 2015-10-11 ENCOUNTER — Ambulatory Visit: Payer: Commercial Managed Care - HMO | Admitting: Sports Medicine

## 2015-10-13 ENCOUNTER — Other Ambulatory Visit: Payer: Self-pay

## 2015-10-13 ENCOUNTER — Encounter: Payer: Self-pay | Admitting: Internal Medicine

## 2015-10-13 ENCOUNTER — Ambulatory Visit (INDEPENDENT_AMBULATORY_CARE_PROVIDER_SITE_OTHER): Payer: Commercial Managed Care - HMO | Admitting: Internal Medicine

## 2015-10-13 VITALS — BP 122/80 | HR 61 | Temp 98.2°F | Ht 66.0 in | Wt 244.0 lb

## 2015-10-13 DIAGNOSIS — E032 Hypothyroidism due to medicaments and other exogenous substances: Secondary | ICD-10-CM

## 2015-10-13 DIAGNOSIS — I639 Cerebral infarction, unspecified: Secondary | ICD-10-CM

## 2015-10-13 DIAGNOSIS — I10 Essential (primary) hypertension: Secondary | ICD-10-CM

## 2015-10-13 DIAGNOSIS — E1121 Type 2 diabetes mellitus with diabetic nephropathy: Secondary | ICD-10-CM

## 2015-10-13 DIAGNOSIS — Z23 Encounter for immunization: Secondary | ICD-10-CM | POA: Diagnosis not present

## 2015-10-13 DIAGNOSIS — I5033 Acute on chronic diastolic (congestive) heart failure: Secondary | ICD-10-CM

## 2015-10-13 DIAGNOSIS — Z598 Other problems related to housing and economic circumstances: Secondary | ICD-10-CM | POA: Diagnosis not present

## 2015-10-13 DIAGNOSIS — R471 Dysarthria and anarthria: Secondary | ICD-10-CM

## 2015-10-13 DIAGNOSIS — Z599 Problem related to housing and economic circumstances, unspecified: Secondary | ICD-10-CM

## 2015-10-13 DIAGNOSIS — IMO0002 Reserved for concepts with insufficient information to code with codable children: Secondary | ICD-10-CM

## 2015-10-13 LAB — TSH: TSH: 39.22 mIU/L — ABNORMAL HIGH (ref 0.40–4.50)

## 2015-10-13 NOTE — Progress Notes (Signed)
Location:  [facility] clinic Provider:  Aubra Pappalardo L. Mariea Clonts, D.O., C.M.D.  Code Status: DNR Goals of Care:  Advanced Directives 10/13/2015  Does patient have an advance directive? No  Copy of advanced directive(s) in chart? -  Would patient like information on creating an advanced directive? Yes - Geneticist, molecular Complaint  Patient presents with  . Medical Management of Chronic Issues    6 week follow-up    HPI: Patient is a 75 y.o. male seen today for medical management of chronic diseases.    He has lost 3 lbs since last visit.  Says he is working hard.  He did not take the extra torsemide that was recommended (two tabs twice a day for 3 days) but has lost the 3 lbs anyway and his edema of his feet is improved some, abdomen less distended.  He had to bring paperwork about his wife also for the application for assistance with his tradjenta so that's the holdup now.  More samples provided today for one month until he starts to receive it from the company.  He is to let us know if he is for sure approved.  Says months ago he had a weird loss of sensation of his right face (felt like cpap mask was only still on his face on the right side).  His wife thinks his speech is different since then.  He doesn't notice.  He does have some speech difficulty.  Past Medical History:  Diagnosis Date  . Allergy   . Asthma   . Atrophic gastritis   . Benign essential hypertension   . COPD (chronic obstructive pulmonary disease) ([place])   . Diabetes ([place])   . GERD (gastroesophageal reflux disease)   . Gout attack 09/2012  . Sinusitis, maxillary, chronic   . Type II or unspecified type diabetes mellitus without mention of complication, not stated as uncontrolled     Past Surgical History:  Procedure Laterality Date  . COLONOSCOPY  2003  . HERNIA REPAIR  1980    No Known Allergies    Medication List       Accurate as of 10/13/15  8:51 AM. Always use your most recent med list.            albuterol (2.5 MG/3ML) 0.083% nebulizer solution Commonly known as:  PROVENTIL INHALE CONTENTS OF 1 VIAL IN NEBULIZER EVERY 4 HOURS AS NEEDED FOR WHEEZING AND ASTHMA   allopurinol 300 MG tablet Commonly known as:  ZYLOPRIM Take 1 tablet (300 mg total) by mouth daily.   amiodarone 200 MG tablet Commonly known as:  PACERONE Take 1 tablet (200 mg total) by mouth 2 (two) times daily.   apixaban 5 MG Tabs tablet Commonly known as:  ELIQUIS Take 1 tablet (5 mg total) by mouth 2 (two) times daily.   CENTRUM SILVER ADULT 50+ PO Take 1 tablet by mouth daily.   levothyroxine 75 MCG tablet Commonly known as:  SYNTHROID, LEVOTHROID Take 1 tablet (75 mcg total) by mouth daily before breakfast.   linagliptin 5 MG Tabs tablet Commonly known as:  TRADJENTA Take 1 tablet (5 mg total) by mouth daily.   montelukast 10 MG tablet Commonly known as:  SINGULAIR Take one tablet by mouth at bedtime as needed for allergies and asthma   pravastatin 20 MG tablet Commonly known as:  PRAVACHOL TAKE 1 TABLET BY MOUTH EVERY DAY WITH SUPPER   torsemide 20 MG tablet Commonly known as:  DEMADEX Take 1 tablet (20  mg total) by mouth 2 (two) times daily. Heart failure       Review of Systems:  Review of Systems  Constitutional: Positive for weight loss. Negative for chills, fever and malaise/fatigue.  HENT: Negative for hearing loss.   Eyes: Negative for blurred vision.  Respiratory: Negative for cough and shortness of breath.   Cardiovascular: Positive for leg swelling. Negative for chest pain and palpitations.  Gastrointestinal: Negative for abdominal pain, blood in stool, constipation and melena.  Genitourinary: Negative for dysuria.  Musculoskeletal: Negative for falls.  Skin: Negative for rash.  Neurological: Positive for speech change. Negative for dizziness, tingling, tremors, sensory change, focal weakness, seizures, loss of consciousness and weakness.  Psychiatric/Behavioral:  Positive for memory loss.    Health Maintenance  Topic Date Due  . ZOSTAVAX  05/28/1999  . FOOT EXAM  05/20/2015  . INFLUENZA VACCINE  09/06/2015  . OPHTHALMOLOGY EXAM  09/07/2015  . HEMOGLOBIN A1C  02/24/2016  . URINE MICROALBUMIN  05/29/2016  . TETANUS/TDAP  02/05/2021  . PNA vac Low Risk Adult  Completed    Physical Exam: Vitals:   10/13/15 0839  BP: 122/80  Pulse: 61  Temp: 98.2 F (36.8 C)  TempSrc: Oral  SpO2: 92%  Weight: 244 lb (110.7 kg)  Height: 5\' 6"  (1.676 m)   Body mass index is 39.38 kg/m. Physical Exam  Constitutional: He is oriented to person, place, and time. He appears well-developed and well-nourished. No distress.  HENT:  Head: Normocephalic and atraumatic.  Cardiovascular:  irreg irreg  Pulmonary/Chest: Effort normal and breath sounds normal. No respiratory distress.  Abdominal: Bowel sounds are normal.  Musculoskeletal: Normal range of motion.  Neurological: He is alert and oriented to person, place, and time. He displays normal reflexes. He exhibits normal muscle tone. Coordination normal.  Dysarthria is notable, but I'm not sure if it's legit or if he is not annunciating  Skin: Skin is warm and dry.  Psychiatric: He has a normal mood and affect.    Labs reviewed: Basic Metabolic Panel:  Recent Labs  12/13/14 0856  07/07/15 1008  08/18/15 0826 08/19/15 0326 08/20/15 0415 08/24/15 0825  NA 142  < >  --   < >  --  140 139 141  K 4.2  < >  --   < >  --  4.1 3.8 3.9  CL 98  < >  --   < >  --  98* 95* 93*  CO2 31  < >  --   < >  --  34* 37* 38*  GLUCOSE 84  < >  --   < >  --  105* 92 107*  BUN 24  < >  --   < >  --  28* 41* 27*  CREATININE 1.36*  < >  --   < >  --  1.49* 1.71* 1.77*  CALCIUM 9.0  < >  --   < >  --  8.5* 8.7* 9.2  MG 1.8  --   --   --   --   --   --   --   TSH 3.535  < > 40.40*  --  26.752*  --   --  44.35*  < > = values in this interval not displayed. Liver Function Tests:  Recent Labs  04/18/15 1010  08/18/15 0413 08/24/15 0825  AST 26 49* 28  ALT 22 27 22   ALKPHOS 48 76 74  BILITOT 0.7 0.7 0.8  PROT 7.0  7.9 7.5  ALBUMIN 4.0 4.0 3.9   No results for input(s): LIPASE, AMYLASE in the last 8760 hours. No results for input(s): AMMONIA in the last 8760 hours. CBC:  Recent Labs  02/25/15 0949  08/18/15 0413 08/20/15 0415 08/24/15 0825  WBC 4.5  < > 6.5 8.0 8.4  NEUTROABS 2.5  --  4.9  --  6,720  HGB  --   < > 11.9* 12.4* 13.4  HCT 39.8  < > 38.1* 40.9 41.8  MCV 92  < > 98.7 98.6 95.2  PLT 201  < > 238 274 253  < > = values in this interval not displayed. Lipid Panel:  Recent Labs  02/25/15 0949 05/25/15 0950 08/24/15 0825  CHOL 171 151 163  HDL 65 68 83  LDLCALC 95 68 69  TRIG 55 76 55  CHOLHDL 2.6 2.2 2.0   Lab Results  Component Value Date   HGBA1C 6.3 (H) 08/24/2015    Assessment/Plan 1. Acute on chronic diastolic CHF (congestive heart failure), NYHA class 2 (HCC) -seems acute spell has resolved, he is down three lbs -cont torsemide bid as he's been taking it  2. Controlled type 2 diabetes mellitus with diabetic nephropathy, without long-term current use of insulin (HCC) -cont tradjenta, pravachol  3. Financial difficulty -is waiting for response about assistance with tradjenta  4. Hypothyroidism due to medication -cont current synthroid -get TSH done today  5. Essential hypertension, benign -bp at goal with current regimen  6. Need for immunization against influenza - Flu Vaccine QUAD 36+ mos PF IM (Fluarix & Fluzone Quad PF)  7. 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 Flu Vaccine QUAD 36+ mos PF IM (Fluarix & Fluzone Quad PF)  . CBC with Differential/Platelet    Standing  Status:   Future    Standing Expiration Date:   06/11/2016  . COMPLETE METABOLIC PANEL WITH GFR    [facility]    Standing Status:   Future    Standing Expiration Date:   06/11/2016  . Lipid panel    Standing Status:   Future    Standing Expiration Date:   06/11/2016    Order Specific Question:   Has the patient fasted?    Answer:   Yes  . TSH    Standing Status:   Future    Standing Expiration Date:   06/11/2016  . Hemoglobin A1c    Standing Status:   Future    Standing Expiration Date:   06/11/2016    Next appt: 4 mos for annual with labs before   [place]. Deitra Craine, D.O. [facility] [address], [place] [address] Cell Phone (Mon-Fri 8am-5pm):  [phone] On Call:  [phone] & follow prompts after 5pm & weekends Office Phone:  [phone] Office Fax:  [phone]

## 2015-10-14 ENCOUNTER — Other Ambulatory Visit: Payer: Self-pay

## 2015-10-14 DIAGNOSIS — E039 Hypothyroidism, unspecified: Secondary | ICD-10-CM

## 2015-10-14 MED ORDER — LEVOTHYROXINE SODIUM 88 MCG PO TABS: 88.0000 ug | ORAL_TABLET | Freq: Every day | ORAL | 2 refills | Status: DC

## 2015-10-24 DIAGNOSIS — H2511 Age-related nuclear cataract, right eye: Secondary | ICD-10-CM | POA: Diagnosis not present

## 2015-10-28 ENCOUNTER — Encounter (HOSPITAL_COMMUNITY): Payer: Self-pay | Admitting: Family Medicine

## 2015-10-28 ENCOUNTER — Inpatient Hospital Stay (HOSPITAL_COMMUNITY)
Admission: EM | Admit: 2015-10-28 | Discharge: 2015-10-30 | DRG: 291 | Disposition: A | Discharge status: Home / Self Care | Payer: Commercial Managed Care - HMO | Attending: Internal Medicine | Admitting: Internal Medicine

## 2015-10-28 ENCOUNTER — Emergency Department (HOSPITAL_COMMUNITY): Payer: Commercial Managed Care - HMO

## 2015-10-28 DIAGNOSIS — R05 Cough: Secondary | ICD-10-CM | POA: Diagnosis not present

## 2015-10-28 DIAGNOSIS — M1A9XX0 Chronic gout, unspecified, without tophus (tophi): Secondary | ICD-10-CM

## 2015-10-28 DIAGNOSIS — Z7901 Long term (current) use of anticoagulants: Secondary | ICD-10-CM

## 2015-10-28 DIAGNOSIS — G4733 Obstructive sleep apnea (adult) (pediatric): Secondary | ICD-10-CM | POA: Diagnosis present

## 2015-10-28 DIAGNOSIS — Z79899 Other long term (current) drug therapy: Secondary | ICD-10-CM | POA: Diagnosis not present

## 2015-10-28 DIAGNOSIS — Z9989 Dependence on other enabling machines and devices: Secondary | ICD-10-CM

## 2015-10-28 DIAGNOSIS — N183 Chronic kidney disease, stage 3 (moderate): Secondary | ICD-10-CM | POA: Diagnosis not present

## 2015-10-28 DIAGNOSIS — E1122 Type 2 diabetes mellitus with diabetic chronic kidney disease: Secondary | ICD-10-CM | POA: Diagnosis present

## 2015-10-28 DIAGNOSIS — N179 Acute kidney failure, unspecified: Secondary | ICD-10-CM | POA: Diagnosis not present

## 2015-10-28 DIAGNOSIS — I4581 Long QT syndrome: Secondary | ICD-10-CM | POA: Diagnosis present

## 2015-10-28 DIAGNOSIS — R059 Cough, unspecified: Secondary | ICD-10-CM | POA: Diagnosis present

## 2015-10-28 DIAGNOSIS — E876 Hypokalemia: Secondary | ICD-10-CM | POA: Diagnosis present

## 2015-10-28 DIAGNOSIS — I130 Hypertensive heart and chronic kidney disease with heart failure and stage 1 through stage 4 chronic kidney disease, or unspecified chronic kidney disease: Secondary | ICD-10-CM | POA: Diagnosis not present

## 2015-10-28 DIAGNOSIS — I5033 Acute on chronic diastolic (congestive) heart failure: Secondary | ICD-10-CM | POA: Diagnosis present

## 2015-10-28 DIAGNOSIS — Z87891 Personal history of nicotine dependence: Secondary | ICD-10-CM | POA: Diagnosis not present

## 2015-10-28 DIAGNOSIS — I110 Hypertensive heart disease with heart failure: Secondary | ICD-10-CM | POA: Diagnosis not present

## 2015-10-28 DIAGNOSIS — Z6841 Body Mass Index (BMI) 40.0 and over, adult: Secondary | ICD-10-CM | POA: Diagnosis not present

## 2015-10-28 DIAGNOSIS — I4819 Other persistent atrial fibrillation: Secondary | ICD-10-CM | POA: Diagnosis present

## 2015-10-28 DIAGNOSIS — I10 Essential (primary) hypertension: Secondary | ICD-10-CM | POA: Diagnosis present

## 2015-10-28 DIAGNOSIS — Z833 Family history of diabetes mellitus: Secondary | ICD-10-CM | POA: Diagnosis not present

## 2015-10-28 DIAGNOSIS — J449 Chronic obstructive pulmonary disease, unspecified: Secondary | ICD-10-CM | POA: Diagnosis present

## 2015-10-28 DIAGNOSIS — I509 Heart failure, unspecified: Secondary | ICD-10-CM | POA: Diagnosis not present

## 2015-10-28 DIAGNOSIS — I481 Persistent atrial fibrillation: Secondary | ICD-10-CM | POA: Diagnosis not present

## 2015-10-28 DIAGNOSIS — R0602 Shortness of breath: Secondary | ICD-10-CM | POA: Diagnosis not present

## 2015-10-28 DIAGNOSIS — E039 Hypothyroidism, unspecified: Secondary | ICD-10-CM | POA: Diagnosis present

## 2015-10-28 DIAGNOSIS — Z8249 Family history of ischemic heart disease and other diseases of the circulatory system: Secondary | ICD-10-CM

## 2015-10-28 DIAGNOSIS — J441 Chronic obstructive pulmonary disease with (acute) exacerbation: Secondary | ICD-10-CM | POA: Diagnosis present

## 2015-10-28 DIAGNOSIS — N1832 Chronic kidney disease, stage 3b: Secondary | ICD-10-CM | POA: Diagnosis present

## 2015-10-28 LAB — COMPREHENSIVE METABOLIC PANEL
ALBUMIN: 4.2 g/dL (ref 3.5–5.0)
ALK PHOS: 64 U/L (ref 38–126)
ALT: 22 U/L (ref 17–63)
ANION GAP: 12 (ref 5–15)
AST: 30 U/L (ref 15–41)
BILIRUBIN TOTAL: 0.8 mg/dL (ref 0.3–1.2)
BUN: 24 mg/dL — AB (ref 6–20)
CALCIUM: 9.1 mg/dL (ref 8.9–10.3)
CO2: 35 mmol/L — ABNORMAL HIGH (ref 22–32)
Chloride: 96 mmol/L — ABNORMAL LOW (ref 101–111)
Creatinine, Ser: 1.93 mg/dL — ABNORMAL HIGH (ref 0.61–1.24)
GFR calc Af Amer: 37 mL/min — ABNORMAL LOW (ref >60)
GFR, EST NON AFRICAN AMERICAN: 32 mL/min — AB (ref >60)
GLUCOSE: 93 mg/dL (ref 65–99)
POTASSIUM: 3 mmol/L — AB (ref 3.5–5.1)
Sodium: 143 mmol/L (ref 135–145)
TOTAL PROTEIN: 7.8 g/dL (ref 6.5–8.1)

## 2015-10-28 LAB — CBC WITH DIFFERENTIAL/PLATELET
BASOS ABS: 0 10*3/uL (ref 0.0–0.1)
Basophils Relative: 0 %
EOS PCT: 3 %
Eosinophils Absolute: 0.2 10*3/uL (ref 0.0–0.7)
HEMATOCRIT: 38 % — AB (ref 39.0–52.0)
Hemoglobin: 11.5 g/dL — ABNORMAL LOW (ref 13.0–17.0)
LYMPHS ABS: 0.7 10*3/uL (ref 0.7–4.0)
LYMPHS PCT: 15 %
MCH: 30.5 pg (ref 26.0–34.0)
MCHC: 30.3 g/dL (ref 30.0–36.0)
MCV: 100.8 fL — AB (ref 78.0–100.0)
MONO ABS: 0.4 10*3/uL (ref 0.1–1.0)
MONOS PCT: 7 %
NEUTROS ABS: 3.7 10*3/uL (ref 1.7–7.7)
Neutrophils Relative %: 75 %
PLATELETS: 168 10*3/uL (ref 150–400)
RBC: 3.77 MIL/uL — ABNORMAL LOW (ref 4.22–5.81)
RDW: 15.7 % — AB (ref 11.5–15.5)
WBC: 4.9 10*3/uL (ref 4.0–10.5)

## 2015-10-28 LAB — TSH: TSH: 25.108 u[IU]/mL — ABNORMAL HIGH (ref 0.350–4.500)

## 2015-10-28 LAB — GLUCOSE, CAPILLARY
GLUCOSE-CAPILLARY: 93 mg/dL (ref 65–99)
Glucose-Capillary: 96 mg/dL (ref 65–99)

## 2015-10-28 LAB — BRAIN NATRIURETIC PEPTIDE: B Natriuretic Peptide: 393.6 pg/mL — ABNORMAL HIGH (ref 0.0–100.0)

## 2015-10-28 LAB — TROPONIN I: TROPONIN I: 0.04 ng/mL — AB (ref <0.03)

## 2015-10-28 MED ORDER — ONDANSETRON HCL 4 MG/2ML IJ SOLN: 4.0000 mg | Freq: Four times a day (QID) | INTRAMUSCULAR | Status: DC | PRN

## 2015-10-28 MED ORDER — FUROSEMIDE 10 MG/ML IJ SOLN
40.0000 mg | Freq: Two times a day (BID) | INTRAMUSCULAR | Status: DC
  Administered 2015-10-28 – 2015-10-30 (×4): 40 mg via INTRAVENOUS
  Filled 2015-10-28 (×4): qty 4

## 2015-10-28 MED ORDER — ACETAMINOPHEN 325 MG PO TABS: 650.0000 mg | ORAL_TABLET | ORAL | Status: DC | PRN

## 2015-10-28 MED ORDER — ALBUTEROL SULFATE (2.5 MG/3ML) 0.083% IN NEBU
5.0000 mg | INHALATION_SOLUTION | Freq: Once | RESPIRATORY_TRACT | Status: AC
  Administered 2015-10-28: 5 mg via RESPIRATORY_TRACT
  Filled 2015-10-28: qty 6

## 2015-10-28 MED ORDER — PREDNISOLONE ACETATE 1 % OP SUSP
1.0000 [drp] | Freq: Four times a day (QID) | OPHTHALMIC | Status: DC
  Administered 2015-10-28 – 2015-10-30 (×7): 1 [drp] via OPHTHALMIC
  Filled 2015-10-28: qty 1

## 2015-10-28 MED ORDER — SODIUM CHLORIDE 0.9% FLUSH
3.0000 mL | Freq: Two times a day (BID) | INTRAVENOUS | Status: DC
  Administered 2015-10-28 – 2015-10-30 (×4): 3 mL via INTRAVENOUS

## 2015-10-28 MED ORDER — PRAVASTATIN SODIUM 20 MG PO TABS
20.0000 mg | ORAL_TABLET | Freq: Every day | ORAL | Status: DC
  Administered 2015-10-28 – 2015-10-29 (×2): 20 mg via ORAL
  Filled 2015-10-28 (×2): qty 1

## 2015-10-28 MED ORDER — INSULIN ASPART 100 UNIT/ML ~~LOC~~ SOLN
0.0000 [IU] | Freq: Three times a day (TID) | SUBCUTANEOUS | Status: DC
  Administered 2015-10-29: 1 [IU] via SUBCUTANEOUS

## 2015-10-28 MED ORDER — KETOROLAC TROMETHAMINE 0.5 % OP SOLN
1.0000 [drp] | Freq: Four times a day (QID) | OPHTHALMIC | Status: DC
  Administered 2015-10-28 – 2015-10-30 (×7): 1 [drp] via OPHTHALMIC
  Filled 2015-10-28: qty 3

## 2015-10-28 MED ORDER — AMIODARONE HCL 200 MG PO TABS
200.0000 mg | ORAL_TABLET | Freq: Two times a day (BID) | ORAL | Status: DC
  Administered 2015-10-28 – 2015-10-29 (×2): 200 mg via ORAL
  Filled 2015-10-28 (×2): qty 1

## 2015-10-28 MED ORDER — POTASSIUM CHLORIDE CRYS ER 20 MEQ PO TBCR
40.0000 meq | EXTENDED_RELEASE_TABLET | Freq: Once | ORAL | Status: AC
  Administered 2015-10-28: 40 meq via ORAL
  Filled 2015-10-28: qty 2

## 2015-10-28 MED ORDER — MONTELUKAST SODIUM 10 MG PO TABS
10.0000 mg | ORAL_TABLET | Freq: Every day | ORAL | Status: DC
  Administered 2015-10-28 – 2015-10-29 (×2): 10 mg via ORAL
  Filled 2015-10-28 (×2): qty 1

## 2015-10-28 MED ORDER — BENZONATATE 100 MG PO CAPS
100.0000 mg | ORAL_CAPSULE | Freq: Three times a day (TID) | ORAL | Status: DC | PRN
  Administered 2015-10-28 – 2015-10-29 (×2): 100 mg via ORAL
  Filled 2015-10-28 (×2): qty 1

## 2015-10-28 MED ORDER — LEVOTHYROXINE SODIUM 88 MCG PO TABS
88.0000 ug | ORAL_TABLET | Freq: Every day | ORAL | Status: DC
  Administered 2015-10-29: 88 ug via ORAL
  Filled 2015-10-28: qty 1

## 2015-10-28 MED ORDER — ALLOPURINOL 300 MG PO TABS
300.0000 mg | ORAL_TABLET | Freq: Every day | ORAL | Status: DC
  Administered 2015-10-28 – 2015-10-29 (×2): 300 mg via ORAL
  Filled 2015-10-28 (×2): qty 1

## 2015-10-28 MED ORDER — OFLOXACIN 0.3 % OP SOLN
1.0000 [drp] | Freq: Four times a day (QID) | OPHTHALMIC | Status: DC
  Administered 2015-10-28 – 2015-10-30 (×7): 1 [drp] via OPHTHALMIC
  Filled 2015-10-28: qty 5

## 2015-10-28 MED ORDER — SODIUM CHLORIDE 0.9% FLUSH: 3.0000 mL | INTRAVENOUS | Status: DC | PRN

## 2015-10-28 MED ORDER — ASPIRIN 81 MG PO CHEW
324.0000 mg | CHEWABLE_TABLET | Freq: Once | ORAL | Status: AC
  Administered 2015-10-28: 324 mg via ORAL
  Filled 2015-10-28: qty 4

## 2015-10-28 MED ORDER — SODIUM CHLORIDE 0.9 % IV SOLN: 250.0000 mL | INTRAVENOUS | Status: DC | PRN

## 2015-10-28 MED ORDER — APIXABAN 5 MG PO TABS
5.0000 mg | ORAL_TABLET | Freq: Two times a day (BID) | ORAL | Status: DC
  Administered 2015-10-28 – 2015-10-30 (×4): 5 mg via ORAL
  Filled 2015-10-28 (×4): qty 1

## 2015-10-28 MED ORDER — FUROSEMIDE 10 MG/ML IJ SOLN
40.0000 mg | Freq: Once | INTRAMUSCULAR | Status: AC
  Administered 2015-10-28: 40 mg via INTRAVENOUS
  Filled 2015-10-28: qty 4

## 2015-10-28 MED ORDER — IPRATROPIUM-ALBUTEROL 0.5-2.5 (3) MG/3ML IN SOLN
3.0000 mL | Freq: Once | RESPIRATORY_TRACT | Status: AC
  Administered 2015-10-28: 3 mL via RESPIRATORY_TRACT
  Filled 2015-10-28: qty 3

## 2015-10-28 NOTE — Care Management Note (Signed)
Case Management Note  Patient Details  Name: Omar Lambert MRN: [id number] Date of Birth: October 14, 1940  Subjective/Objective:    Hx of diastolic CHF, atrial flutter, allergies, CKD stage III, COPD, diabetes mellitus type 2, hypertension, OSA         DX cough, CHF     Pt with [facility] ED visits x 1 and admission x 1  CM consult: Patient has a faulty cpap and needs some help getting a new one  Confirms Dr [name] is his pcp but states she does know he has a cpap but does not know how to help him "She don't know nothing about it" Pt does not know the name of his cpap nor the company he obtained it from Pt states he was in [facility] when he got his CPAP >5 yrs ago When CM was discussing a possible need of another sleep study to meet humana medicare criteria to getting a new cpap, he adamantly refused to do another sleep study "I'm not doing another sleep study" "I was trying to find the cheapest way to get it" Pt than began to tell Cm that he may need replacement mask parts like a filter and strap to the mask  Pt agreed to allow [facility] to follow him while hospitalized to see if they can assist him with new cpap mask items after he provide name of cpap and company that he obtained it from Support system in home - wife (working at the time of the assessment per pt)    Action/Plan:  CM noted CM consult -CM reviewed EPIC notes for pt, dx, PMH, CM consulted Melissa of [facility] to confirm Cm knowledge of how pt can get a replacement for cpap Confirmed pt would need to know the type of cpap, company he obtained cpap from and if he had it for >5 yrs Cm spoke with pt to discuss purpose of CM consult, info confirmed with consulting Home health agency DME company and assess info about his cpap  CM discussed >5 yr may need sleep study, needed name of the cpap and the company that supplied it Offer to contact Dr Mariea Clonts for collateral information until pt stated Dr Mariea Clonts would not be able to assist.  Discussed that sleep studies are not completed at [facility] but generally an outpatient services  CM encouraged pt to have his wife write the name of he [place] down or call it in to his RN or CM so [facility] staff can see if they can assist him CM used teach back method to confirm pt understanding that this DME may require meeting medicare criteria for replacement because he has had his cps >5 years Provided pt with a written sheet -Helping patients replace their current CPAP machines- Medicare Replacement Guidelines per CMS IOM, Publication 283-6, Chapter 20,"Supplier Replacement of Beneficiary-owned Capped Rental Equipment Based upon Accumulated Repair Costs" email notification, DME [employer], July 14, 2010 Medicare Benefit Policy Manual, 629-47, Chapter 15, Section 100.2 Supplier Manual, Chapter 5 CMS, "PAP Devices for the Treatment of OSA (L171)," U.S. Department of Health and Human Services  Placed this sheet and pt shirt in a pt belonging bag on bedside chair   Expected Discharge Date:   (unknown) pending              Expected Discharge Plan:   pending  In-House Referral:   [facility]   Discharge planning Services   possible assist from home  health DME services   Post Acute Care Choice:    possible assist from home health DME services  Choice offered to:   patient  DME Arranged:   cpap DME Agency:   [facility]   HH Arranged:   na HH Agency:   na  Status of Service:   completed  If discussed at Long Length of Stay Meetings, dates discussed:    Additional Comments:  [place], Sunday, RN 10/28/2015, 2:26 PM

## 2015-10-28 NOTE — Progress Notes (Signed)
Offered to place pt on nocturnal cpap, but pt declined.  Pt was advised that RT is available all night should he change his mind.

## 2015-10-28 NOTE — H&P (Signed)
History and Physical    TEDDIE MEHTA GYF:[id number] DOB: [birth year] DOA: 10/28/2015  PCP: [name], DO Patient coming from: Home  Chief Complaint: Dyspnea  HPI: Omar Lambert is a 75 y.o. male with medical history significant of diastolic CHF, atrial flutter, allergies, CKD stage III, COPD, diabetes mellitus type 2, hypertension, OSA. Patient states that for about the last week he has been having increased dyspnea especially with exertion. He has associated cough with production of clear sputum. His exercise tolerance has diminished. He reports that he has been taking his torsemide, however he does not take it every day as directed as he misses some doses. His diet has remained low in sodium.   ED Course: Vitals: Afebrile with normal vitals. He is satting mid 90s on room air Labs: Hypokalemia with a potassium of 3.0. BMP is up to 393 and troponin is slightly elevated at 0.04. Hemoglobin of 11.5 and creatinine of 1.93 which is above baseline of about 1.5 Imaging: Chest x-ray significant for vascular congestion Medications/Course: Patient received aspirin 324 mg, albuterol, furosemide 40 mg IV, DuoNeb and potassium 40 mEq  Review of Systems: Review of Systems  Constitutional: Negative for chills, diaphoresis, fever, malaise/fatigue and weight loss.  Respiratory: Positive for cough, sputum production, shortness of breath and wheezing. Negative for hemoptysis.   Cardiovascular: Positive for orthopnea and leg swelling. Negative for chest pain, palpitations and claudication.  Gastrointestinal: Positive for nausea. Negative for abdominal pain, constipation, diarrhea and vomiting.  All other systems reviewed and are negative.   Past Medical History:  Diagnosis Date  . Allergy   . Asthma   . Atrophic gastritis   . Benign essential hypertension   . COPD (chronic obstructive pulmonary disease) ([place])   . Diabetes ([place])   . GERD (gastroesophageal reflux disease)   . Gout attack 09/2012    . Sinusitis, maxillary, chronic   . Type II or unspecified type diabetes mellitus without mention of complication, not stated as uncontrolled     Past Surgical History:  Procedure Laterality Date  . COLONOSCOPY  2003  . [place]     reports that he quit smoking about 18 years ago. His smoking use included Cigarettes. He has a 20.00 pack-year smoking history. He has never used smokeless tobacco. He reports that he does not drink alcohol or use drugs.  No Known Allergies  Family History  Problem Relation Age of Onset  . Stroke Mother   . Cancer Sister   . Diabetes Sister   . COPD Sister   . COPD Sister     Prior to Admission medications   Medication Sig Start Date End Date Taking? Authorizing Provider  albuterol (PROVENTIL) (2.5 MG/3ML) 0.083% nebulizer solution INHALE CONTENTS OF 1 VIAL IN NEBULIZER EVERY 4 HOURS AS NEEDED FOR WHEEZING AND ASTHMA 10/06/15  Yes [name] L [name], DO  allopurinol (ZYLOPRIM) 300 MG tablet Take 1 tablet (300 mg total) by mouth daily. 05/04/15  Yes Gildardo Cranker, DO  amiodarone (PACERONE) 200 MG tablet Take 1 tablet (200 mg total) by mouth 2 (two) times daily. 08/08/15  Yes Troy Sine, MD  apixaban (ELIQUIS) 5 MG TABS tablet Take 1 tablet (5 mg total) by mouth 2 (two) times daily. 09/29/15  Yes [name] L [name], DO  ibuprofen (ADVIL,MOTRIN) 200 MG tablet Take 400 mg by mouth every 6 (six) hours as needed for fever, headache or mild pain.   Yes Historical Provider, MD  ketorolac (ACULAR) 0.4 % SOLN Place 1  drop into the right eye 4 (four) times daily.  09/16/15  Yes Historical Provider, MD  levothyroxine (SYNTHROID, LEVOTHROID) 88 MCG tablet Take 1 tablet (88 mcg total) by mouth daily before breakfast. 10/14/15  Yes Lauree Chandler, NP  linagliptin (TRADJENTA) 5 MG TABS tablet Take 1 tablet (5 mg total) by mouth daily. 09/30/15  Yes [name] L [name], DO  Multiple Vitamins-Minerals (CENTRUM SILVER ADULT 50+ PO) Take 1 tablet by mouth daily.   Yes  Historical Provider, MD  ofloxacin (OCUFLOX) 0.3 % ophthalmic solution Place 1 drop into the right eye 4 (four) times daily.  09/16/15  Yes Historical Provider, MD  pravastatin (PRAVACHOL) 20 MG tablet TAKE 1 TABLET BY MOUTH EVERY DAY WITH SUPPER 09/26/15  Yes [name] L [name], DO  prednisoLONE acetate (PRED FORTE) 1 % ophthalmic suspension Place 1 drop into the right eye 4 (four) times daily.  09/16/15  Yes Historical Provider, MD  torsemide (DEMADEX) 20 MG tablet Take 1 tablet (20 mg total) by mouth 2 (two) times daily. Heart failure 09/30/15  Yes [name] L [name], DO  montelukast (SINGULAIR) 10 MG tablet Take one tablet by mouth at bedtime as needed for allergies and asthma 09/27/15   Lauree Chandler, NP    Physical Exam: Vitals:   10/28/15 0950 10/28/15 0957 10/28/15 0959 10/28/15 1018  BP: 118/76  118/76 119/75  Pulse:  63 64 64  Resp: 17 14 17 20   Temp:      SpO2:  92% 97% 93%     Constitutional: NAD, calm, comfortable, obese Eyes: PERRL, lids and conjunctivae normal ENMT: Mucous membranes are moist. Posterior pharynx clear of any exudate or lesions.Normal dentition.  Neck: normal, supple, no masses, no thyromegaly, +JVD Respiratory: Crackles bilaterally. No wheezing. Normal respiratory effort. No accessory muscle use.  Cardiovascular: Regular rate and rhythm, no murmurs / rubs / gallops. Trace extremity edema. 1+ pedal pulses. Abdomen: no tenderness, no masses palpated. Bowel sounds positive. Obese Musculoskeletal: no clubbing / cyanosis. No joint deformity upper and lower extremities. Good ROM, no contractures. Normal muscle tone.  Skin: no rashes, lesions, ulcers. No induration Neurologic: CN 2-12 grossly intact. Sensation intact, DTR normal. Strength 5/5 in all 4.  Psychiatric: Normal judgment and insight. Alert and oriented x 3. Normal mood.   Labs on Admission: I have personally reviewed following labs and imaging studies  CBC:  Recent Labs Lab 10/28/15 0938  WBC 4.9    NEUTROABS 3.7  HGB 11.5*  HCT 38.0*  MCV 100.8*  PLT 962   Basic Metabolic Panel:  Recent Labs Lab 10/28/15 0938  NA 143  K 3.0*  CL 96*  CO2 35*  GLUCOSE 93  BUN 24*  CREATININE 1.93*  CALCIUM 9.1   GFR: CrCl cannot be calculated (Unknown ideal weight.). Liver Function Tests:  Recent Labs Lab 10/28/15 0938  AST 30  ALT 22  ALKPHOS 64  BILITOT 0.8  PROT 7.8  ALBUMIN 4.2   No results for input(s): LIPASE, AMYLASE in the last 168 hours. No results for input(s): AMMONIA in the last 168 hours. Coagulation Profile: No results for input(s): INR, PROTIME in the last 168 hours. Cardiac Enzymes:  Recent Labs Lab 10/28/15 0938  TROPONINI 0.04*   BNP (last 3 results) No results for input(s): PROBNP in the last 8760 hours. HbA1C: No results for input(s): HGBA1C in the last 72 hours. CBG: No results for input(s): GLUCAP in the last 168 hours. Lipid Profile: No results for input(s): CHOL, HDL, LDLCALC, TRIG, CHOLHDL,  LDLDIRECT in the last 72 hours. Thyroid Function Tests: No results for input(s): TSH, T4TOTAL, FREET4, T3FREE, THYROIDAB in the last 72 hours. Anemia Panel: No results for input(s): VITAMINB12, FOLATE, FERRITIN, TIBC, IRON, RETICCTPCT in the last 72 hours. Urine analysis:    Component Value Date/Time   COLORURINE YELLOW 08/18/2015 0429   APPEARANCEUR CLEAR 08/18/2015 0429   LABSPEC 1.016 08/18/2015 0429   PHURINE 5.5 08/18/2015 0429   GLUCOSEU NEGATIVE 08/18/2015 0429   HGBUR NEGATIVE 08/18/2015 0429   BILIRUBINUR NEGATIVE 08/18/2015 0429   KETONESUR NEGATIVE 08/18/2015 0429   PROTEINUR 30 (A) 08/18/2015 0429   UROBILINOGEN 0.2 06/17/2012 0622   NITRITE NEGATIVE 08/18/2015 0429   LEUKOCYTESUR NEGATIVE 08/18/2015 0429   Sepsis Labs: !!!!!!!!!!!!!!!!!!!!!!!!!!!!!!!!!!!!!!!!!!!! @LABRCNTIP (procalcitonin:4,lacticidven:4) )No results found for this or any previous visit (from the past 240 hour(s)).   Radiological Exams on Admission: Dg Chest 2  View  Result Date: 10/28/2015 CLINICAL DATA:  Cough with white phlegm. Shortness of breath for 1 week. EXAM: CHEST  2 VIEW COMPARISON:  08/18/2015 and 12/25/2012 FINDINGS: Heart size is upper limits of normal and stable. Aortic arch calcifications. Lung markings are mildly prominent. Trachea is midline. Aortic arch calcifications. No significant pleural effusions. No acute bone abnormalities. Stable area of sclerosis or calcification near the anterior right third rib. IMPRESSION: Prominent lung markings are suggestive for vascular congestion or mild interstitial edema. Aortic atherosclerosis. Electronically Signed   By: Markus Daft M.D.   On: 10/28/2015 07:58    EKG: Independently reviewed. T wave abnormalities improved from previous tracing. Prolonged QTc  Assessment/Plan Principal Problem:   CHF exacerbation (HCC) Active Problems:   Severe obesity (BMI >= 40) (HCC)   COPD GOLD II   OSA on CPAP   Type 2 diabetes mellitus, controlled, with renal complications (HCC)   Chronic kidney disease (CKD) stage G3b/A2, moderately decreased glomerular filtration rate (GFR) between 30-44 mL/min/1.73 square meter and albuminuria creatinine ratio between 30-299 mg/g   Persistent atrial fibrillation (HCC)   Essential hypertension, benign   Cough  Acute on chronic diastolic CHF exacerbation Patient's weight in the ED actually appears lower dry weight. Patient is mildly symptomatic. I cleaned secondary to nonadherence of diuretic therapy. -Lasix 40 mg twice a day IV -Daily weights -Strict ins and outs -Consider starting a ACEI or ARB when kidney function improved -Fluid restriction: 1200 mL  AKI on CKD stage III likely secondary to fluid overload. Patient does not appear hypovolemic. Expect to improve with treatment of CHF exacerbation -Repeat BMP tomorrow  Hypothyroidism Last TSH of 39.22 on September 7. Synthroid was increased to 88 mcg at that time. -Repeat TSH -As long as TSH is not higher, will  continue with current regimen. Patient should have a TSH repeated 4-6 weeks from previous TSH on September 7  Atrial fibrillation -Continue Eliquis  Essential hypertension Does not appear patient is on medication for this -Consider adding an ACEI or ARB as above when kidney function improved  Type 2 diabetes -Sliding-scale insulin  Obstructive sleep apnea -CPAP daily at bedtime  Severe obesity -Patient will need ongoing nutrition counseling in addition to an exercise regimen  Prolonged QTc -repeat EKG in AM  DVT prophylaxis: Eliquis Code Status: Full code Family Communication: Wife at bedside Disposition Plan: Likely discharge home tomorrow Consults called: None Admission status: Observation, telemetry   Cordelia Poche, MD [facility] Pager [phone]  If 7PM-7AM, please contact night-coverage [email or web address] Password [email or web address]  10/28/2015, 1:22 PM

## 2015-10-28 NOTE — ED Triage Notes (Signed)
Pt c/o cough for a week. He says he has been seen for some "fluid in his lungs previously" and wants to make sure it hasn't come back. Pt w/ expiratory wheeze w/ rhonchi upon assessment.

## 2015-10-28 NOTE — ED Notes (Addendum)
1615-

## 2015-10-28 NOTE — ED Notes (Signed)
Notified by lab of need to collect gold top tube for SST RN notified

## 2015-10-28 NOTE — ED Provider Notes (Addendum)
[facility] DEPT Provider Note   CSN: [id number] Arrival date & time: 10/28/15  7654     History   Chief Complaint Chief Complaint  Patient presents with  . Cough    HPI Omar Lambert is a 75 y.o. male.  HPI 75 year old male with past medical history of COPD, diabetes, CHF, who presents with shortness of breath. The patient states that for the last week he has had a progressively worsening productive cough. He states his symptoms began gradually and have progressively worsened. His cough is productive of white sputum. He endorses associated shortness of breath as well as mild orthopnea but denies any lower extremity edema. He admits he has missed some of his medications including his home diuretic. Denies any associated fevers or chills. No nasal congestion or sore throat. Denies any abdominal pain, nausea or vomiting. His symptoms are made worse with any movement and are relieved with rest. Denies any associated chest pain.  Past Medical History:  Diagnosis Date  . Allergy   . Asthma   . Atrophic gastritis   . Benign essential hypertension   . COPD (chronic obstructive pulmonary disease) ([place])   . Diabetes ([place])   . GERD (gastroesophageal reflux disease)   . Gout attack 09/2012  . Sinusitis, maxillary, chronic   . Type II or unspecified type diabetes mellitus without mention of complication, not stated as uncontrolled     Patient Active Problem List   Diagnosis Date Noted  . Cough 10/28/2015  . CHF exacerbation ([place]) 10/28/2015  . Pulmonary edema 08/18/2015  . Acute diastolic CHF (congestive heart failure) ([place]) 08/18/2015  . Essential hypertension, benign 02/28/2015  . 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 Morbid obesity due to excess calories ([place]) 02/28/2015  . Hyperlipidemia associated with type 2 diabetes mellitus ([place]) 02/28/2015  . Persistent atrial fibrillation ([place]) 12/18/2014  . Obesity (BMI 30-39.9) 12/18/2014  . Alteration in anticoagulation 12/18/2014  . Atrial  flutter ([place]) 11/12/2014  . Type 2 diabetes mellitus, controlled, with renal complications ([place]) 65/04/5463  . Chronic kidney disease (CKD) stage G3b/A2, moderately decreased glomerular filtration rate (GFR) between 30-44 mL/min/1.73 square meter and albuminuria creatinine ratio between 30-299 mg/g 08/19/2014  . Common wart 08/19/2014  . Noncompliance with medication regimen 02/15/2014  . Environmental allergies 02/15/2014  . 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 Belching 09/18/2013  . Esophageal reflux 09/18/2013  . Acute gout 06/08/2013  . Diabetes mellitus type II, controlled ([place]) 06/08/2013  . Venous insufficiency of both lower extremities 03/14/2013  . OSA on CPAP 12/22/2012  . COPD GOLD II 11/26/2012  . Severe obesity (BMI >= 40) ([place]) 10/16/2012  . Sinusitis, maxillary, chronic   . Asthma   . Allergy   . Benign essential hypertension   . Gout attack 09/05/2012    Past Surgical History:  Procedure Laterality Date  . COLONOSCOPY  2003  . HERNIA REPAIR  1980       Home Medications    Prior to Admission medications   Medication Sig Start Date End Date Taking? Authorizing Provider  albuterol (PROVENTIL) (2.5 MG/3ML) 0.083% nebulizer solution INHALE CONTENTS OF 1 VIAL IN NEBULIZER EVERY 4 HOURS AS NEEDED FOR WHEEZING AND ASTHMA 10/06/15  Yes [name] L [name], DO  amiodarone (PACERONE) 200 MG tablet Take 1 tablet (200 mg total) by mouth 2 (two) times daily. 08/08/15  Yes Troy Sine, MD  apixaban (ELIQUIS) 5 MG TABS tablet Take 1 tablet (5 mg total) by mouth 2 (two) times daily. 09/29/15  Yes [name] L  [name], DO  ibuprofen (ADVIL,MOTRIN) 200 MG tablet Take 400 mg by mouth every 6 (six) hours as needed for fever, headache or mild pain.   Yes Historical Provider, MD  ketorolac (ACULAR) 0.4 % SOLN Place 1 drop into the right eye 4 (four) times daily.  09/16/15  Yes Historical Provider, MD  levothyroxine (SYNTHROID, LEVOTHROID) 88 MCG tablet Take 1 tablet (88 mcg total) by mouth daily  before breakfast. 10/14/15  Yes Lauree Chandler, NP  linagliptin (TRADJENTA) 5 MG TABS tablet Take 1 tablet (5 mg total) by mouth daily. 09/30/15  Yes [name] L [name], DO  Multiple Vitamins-Minerals (CENTRUM SILVER ADULT 50+ PO) Take 1 tablet by mouth daily.   Yes Historical Provider, MD  ofloxacin (OCUFLOX) 0.3 % ophthalmic solution Place 1 drop into the right eye 4 (four) times daily.  09/16/15  Yes Historical Provider, MD  pravastatin (PRAVACHOL) 20 MG tablet TAKE 1 TABLET BY MOUTH EVERY DAY WITH SUPPER 09/26/15  Yes [name] L [name], DO  prednisoLONE acetate (PRED FORTE) 1 % ophthalmic suspension Place 1 drop into the right eye 4 (four) times daily.  09/16/15  Yes Historical Provider, MD  torsemide (DEMADEX) 20 MG tablet Take 1 tablet (20 mg total) by mouth 2 (two) times daily. Heart failure 09/30/15  Yes [name] L [name], DO  allopurinol (ZYLOPRIM) 300 MG tablet Take 0.5 tablets (150 mg total) by mouth daily. 10/29/15   Kinnie Feil, MD  levothyroxine (SYNTHROID) 125 MCG tablet Take 1 tablet (125 mcg total) by mouth daily before breakfast. 10/29/15   Kinnie Feil, MD  montelukast (SINGULAIR) 10 MG tablet Take one tablet by mouth at bedtime as needed for allergies and asthma 09/27/15   Lauree Chandler, NP  potassium chloride (K-DUR) 10 MEQ tablet Take 1 tablet (10 mEq total) by mouth daily. 10/29/15   Kinnie Feil, MD    Family History Family History  Problem Relation Age of Onset  . Stroke Mother   . Cancer Sister   . Diabetes Sister   . COPD Sister   . COPD Sister     Social History Social History  Substance Use Topics  . Smoking status: Former Smoker    Packs/day: 1.00    Years: 20.00    Types: Cigarettes    Quit date: 06/17/1997  . Smokeless tobacco: Never Used  . Alcohol use No     Allergies   Review of patient's allergies indicates no known allergies.   Review of Systems Review of Systems  Constitutional: Positive for fatigue. Negative for chills and fever.  HENT:  Negative for congestion and rhinorrhea.   Eyes: Negative for visual disturbance.  Respiratory: Positive for cough and shortness of breath. Negative for wheezing.   Cardiovascular: Negative for chest pain and leg swelling.  Gastrointestinal: Negative for abdominal pain, diarrhea, nausea and vomiting.  Genitourinary: Negative for dysuria and flank pain.  Musculoskeletal: Negative for neck pain and neck stiffness.  Skin: Negative for rash and wound.  Allergic/Immunologic: Negative for immunocompromised state.  Neurological: Negative for syncope, weakness and headaches.  All other systems reviewed and are negative.    Physical Exam Updated Vital Signs BP 129/71 (BP Location: Left Arm)   Pulse 62   Temp 97.8 F (36.6 C) (Oral)   Resp 20   Ht 5\' 6"  (1.676 m)   Wt 242 lb 11.6 oz (110.1 kg)   SpO2 94%   BMI 39.18 kg/m   Physical Exam  Constitutional: He is oriented to person, place,  and time. He appears well-developed and well-nourished. No distress.  HENT:  Head: Normocephalic and atraumatic.  Eyes: Conjunctivae are normal.  Neck: Neck supple.  Cardiovascular: Normal rate, regular rhythm and normal heart sounds.  Exam reveals no friction rub.   No murmur heard. Pulmonary/Chest: Effort normal. No respiratory distress. He has decreased breath sounds. He has wheezes (Mild, end expiratory). He has no rhonchi. He has rales in the right lower field and the left lower field.  Abdominal: He exhibits no distension.  Musculoskeletal: He exhibits no edema.  Neurological: He is alert and oriented to person, place, and time. He exhibits normal muscle tone.  Skin: Skin is warm. Capillary refill takes less than 2 seconds.  Psychiatric: He has a normal mood and affect.  Nursing note and vitals reviewed.    ED Treatments / Results  Labs (all labs ordered are listed, but only abnormal results are displayed) Labs Reviewed  CBC WITH DIFFERENTIAL/PLATELET - Abnormal; Notable for the following:         Result Value   RBC 3.77 (*)    Hemoglobin 11.5 (*)    HCT 38.0 (*)    MCV 100.8 (*)    RDW 15.7 (*)    All other components within normal limits  BRAIN NATRIURETIC PEPTIDE - Abnormal; Notable for the following:    B Natriuretic Peptide 393.6 (*)    All other components within normal limits  COMPREHENSIVE METABOLIC PANEL - Abnormal; Notable for the following:    Potassium 3.0 (*)    Chloride 96 (*)    CO2 35 (*)    BUN 24 (*)    Creatinine, Ser 1.93 (*)    GFR calc non Af Amer 32 (*)    GFR calc Af Amer 37 (*)    All other components within normal limits  TROPONIN I - Abnormal; Notable for the following:    Troponin I 0.04 (*)    All other components within normal limits  TSH - Abnormal; Notable for the following:    TSH 25.108 (*)    All other components within normal limits  BASIC METABOLIC PANEL - Abnormal; Notable for the following:    Potassium 3.3 (*)    Chloride 96 (*)    CO2 37 (*)    BUN 23 (*)    Creatinine, Ser 1.67 (*)    GFR calc non Af Amer 38 (*)    GFR calc Af Amer 44 (*)    All other components within normal limits  GLUCOSE, CAPILLARY - Abnormal; Notable for the following:    Glucose-Capillary 123 (*)    All other components within normal limits  GLUCOSE, CAPILLARY  GLUCOSE, CAPILLARY    EKG  EKG Interpretation  Date/Time:  Friday October 28 2015 09:48:53 EDT Ventricular Rate:  63 PR Interval:    QRS Duration: 104 QT Interval:  605 QTC Calculation: 620 R Axis:   -59 Text Interpretation:  Sinus rhythm Prolonged PR interval LAD, consider left anterior fascicular block Prolonged QT interval Confirmed by Christy Gentles  MD, DONALD ([id number]) on 10/29/2015 8:29:56 AM        Radiology Dg Chest 2 View  Result Date: 10/28/2015 CLINICAL DATA:  Cough with white phlegm. Shortness of breath for 1 week. EXAM: CHEST  2 VIEW COMPARISON:  08/18/2015 and 12/25/2012 FINDINGS: Heart size is upper limits of normal and stable. Aortic arch calcifications. Lung  markings are mildly prominent. Trachea is midline. Aortic arch calcifications. No significant pleural effusions. No acute bone abnormalities. Stable  area of sclerosis or calcification near the anterior right third rib. IMPRESSION: Prominent lung markings are suggestive for vascular congestion or mild interstitial edema. Aortic atherosclerosis. Electronically Signed   By: Markus Daft M.D.   On: 10/28/2015 07:58    Procedures Procedures (including critical care time)  Medications Ordered in ED Medications  ketorolac (ACULAR) 0.5 % ophthalmic solution 1 drop (1 drop Right Eye Given 10/29/15 0932)  ofloxacin (OCUFLOX) 0.3 % ophthalmic solution 1 drop (1 drop Right Eye Given 10/29/15 0932)  prednisoLONE acetate (PRED FORTE) 1 % ophthalmic suspension 1 drop (1 drop Right Eye Given 10/29/15 0931)  apixaban (ELIQUIS) tablet 5 mg (5 mg Oral Given 10/29/15 0931)  montelukast (SINGULAIR) tablet 10 mg (10 mg Oral Given 10/28/15 2206)  pravastatin (PRAVACHOL) tablet 20 mg (20 mg Oral Given 10/28/15 1734)  sodium chloride flush (NS) 0.9 % injection 3 mL (3 mLs Intravenous Given 10/29/15 0932)  sodium chloride flush (NS) 0.9 % injection 3 mL (not administered)  0.9 %  sodium chloride infusion (not administered)  acetaminophen (TYLENOL) tablet 650 mg (not administered)  ondansetron (ZOFRAN) injection 4 mg (not administered)  furosemide (LASIX) injection 40 mg (40 mg Intravenous Given 10/29/15 0818)  insulin aspart (novoLOG) injection 0-9 Units (1 Units Subcutaneous Given 10/29/15 0818)  benzonatate (TESSALON) capsule 100 mg (100 mg Oral Given 10/28/15 2206)  sodium chloride (OCEAN) 0.65 % nasal spray 1 spray (1 spray Each Nare Given 10/29/15 0604)  potassium chloride SA (K-DUR,KLOR-CON) CR tablet 20 mEq (not administered)  allopurinol (ZYLOPRIM) tablet 150 mg (not administered)  levothyroxine (SYNTHROID, LEVOTHROID) tablet 125 mcg (not administered)  potassium chloride SA (K-DUR,KLOR-CON) CR tablet 20 mEq (not  administered)  albuterol (PROVENTIL) (2.5 MG/3ML) 0.083% nebulizer solution 5 mg (5 mg Nebulization Given 10/28/15 0752)  ipratropium-albuterol (DUONEB) 0.5-2.5 (3) MG/3ML nebulizer solution 3 mL (3 mLs Nebulization Given 10/28/15 1018)  furosemide (LASIX) injection 40 mg (40 mg Intravenous Given 10/28/15 1146)  potassium chloride SA (K-DUR,KLOR-CON) CR tablet 40 mEq (40 mEq Oral Given 10/28/15 1149)  aspirin chewable tablet 324 mg (324 mg Oral Given 10/28/15 1148)     Initial Impression / Assessment and Plan / ED Course  I have reviewed the triage vital signs and the nursing notes.  Pertinent labs & imaging results that were available during my care of the patient were reviewed by me and considered in my medical decision making (see chart for details).  Clinical Course  Value Comment By Time  ED EKG (Reviewed) Duffy Bruce, MD 09/22 [phone]    75 yo M with COPD, DM2, CHF here with SOB. On arrival, VSS and WNL. Exam is as above. Labs, imaging c/w likely CHF exacerbation. BNP 300s and trop 0.04, likely 2/2 demand. CXR c/w mild hypervolemia. No evidence of STEMI on EKG. Will start lasix, admit for IV diuresis. Pt in agreement. No leukocytosis or signs of infection.   Final Clinical Impressions(s) / ED Diagnoses   Final diagnoses:  Acute on chronic congestive heart failure, unspecified congestive heart failure type [facility])      Duffy Bruce, MD 10/29/15 1134    Duffy Bruce, MD 10/29/15 1134

## 2015-10-28 NOTE — Progress Notes (Signed)
Updated [facility] about pt response  Entered in tx note for unit Cm and attending MD about ED CM interventions and [facility] to follow for d/c needs

## 2015-10-29 DIAGNOSIS — I509 Heart failure, unspecified: Secondary | ICD-10-CM

## 2015-10-29 DIAGNOSIS — Z79899 Other long term (current) drug therapy: Secondary | ICD-10-CM | POA: Diagnosis not present

## 2015-10-29 DIAGNOSIS — E1122 Type 2 diabetes mellitus with diabetic chronic kidney disease: Secondary | ICD-10-CM | POA: Diagnosis present

## 2015-10-29 DIAGNOSIS — R05 Cough: Secondary | ICD-10-CM | POA: Diagnosis present

## 2015-10-29 DIAGNOSIS — Z87891 Personal history of nicotine dependence: Secondary | ICD-10-CM | POA: Diagnosis not present

## 2015-10-29 DIAGNOSIS — J449 Chronic obstructive pulmonary disease, unspecified: Secondary | ICD-10-CM | POA: Diagnosis present

## 2015-10-29 DIAGNOSIS — E876 Hypokalemia: Secondary | ICD-10-CM | POA: Diagnosis present

## 2015-10-29 DIAGNOSIS — Z8249 Family history of ischemic heart disease and other diseases of the circulatory system: Secondary | ICD-10-CM | POA: Diagnosis not present

## 2015-10-29 DIAGNOSIS — I4581 Long QT syndrome: Secondary | ICD-10-CM | POA: Diagnosis present

## 2015-10-29 DIAGNOSIS — I130 Hypertensive heart and chronic kidney disease with heart failure and stage 1 through stage 4 chronic kidney disease, or unspecified chronic kidney disease: Secondary | ICD-10-CM | POA: Diagnosis present

## 2015-10-29 DIAGNOSIS — E039 Hypothyroidism, unspecified: Secondary | ICD-10-CM | POA: Diagnosis present

## 2015-10-29 DIAGNOSIS — N183 Chronic kidney disease, stage 3 (moderate): Secondary | ICD-10-CM | POA: Diagnosis present

## 2015-10-29 DIAGNOSIS — I481 Persistent atrial fibrillation: Secondary | ICD-10-CM | POA: Diagnosis present

## 2015-10-29 DIAGNOSIS — I10 Essential (primary) hypertension: Secondary | ICD-10-CM | POA: Diagnosis not present

## 2015-10-29 DIAGNOSIS — Z833 Family history of diabetes mellitus: Secondary | ICD-10-CM | POA: Diagnosis not present

## 2015-10-29 DIAGNOSIS — G4733 Obstructive sleep apnea (adult) (pediatric): Secondary | ICD-10-CM | POA: Diagnosis present

## 2015-10-29 DIAGNOSIS — I5033 Acute on chronic diastolic (congestive) heart failure: Secondary | ICD-10-CM | POA: Diagnosis present

## 2015-10-29 DIAGNOSIS — Z6841 Body Mass Index (BMI) 40.0 and over, adult: Secondary | ICD-10-CM | POA: Diagnosis not present

## 2015-10-29 DIAGNOSIS — Z7901 Long term (current) use of anticoagulants: Secondary | ICD-10-CM | POA: Diagnosis not present

## 2015-10-29 DIAGNOSIS — N179 Acute kidney failure, unspecified: Secondary | ICD-10-CM | POA: Diagnosis present

## 2015-10-29 LAB — GLUCOSE, CAPILLARY
GLUCOSE-CAPILLARY: 58 mg/dL — AB (ref 65–99)
GLUCOSE-CAPILLARY: 57 mg/dL — AB (ref 65–99)
GLUCOSE-CAPILLARY: 53 mg/dL — AB (ref 65–99)
GLUCOSE-CAPILLARY: 85 mg/dL (ref 65–99)
GLUCOSE-CAPILLARY: 75 mg/dL (ref 65–99)
Glucose-Capillary: 123 mg/dL — ABNORMAL HIGH (ref 65–99)
Glucose-Capillary: 47 mg/dL — ABNORMAL LOW (ref 65–99)
Glucose-Capillary: 66 mg/dL (ref 65–99)
Glucose-Capillary: 99 mg/dL (ref 65–99)

## 2015-10-29 LAB — BASIC METABOLIC PANEL
ANION GAP: 9 (ref 5–15)
BUN: 23 mg/dL — ABNORMAL HIGH (ref 6–20)
CALCIUM: 9.2 mg/dL (ref 8.9–10.3)
CHLORIDE: 96 mmol/L — AB (ref 101–111)
CO2: 37 mmol/L — AB (ref 22–32)
Creatinine, Ser: 1.67 mg/dL — ABNORMAL HIGH (ref 0.61–1.24)
GFR calc Af Amer: 44 mL/min — ABNORMAL LOW (ref >60)
GFR calc non Af Amer: 38 mL/min — ABNORMAL LOW (ref >60)
GLUCOSE: 89 mg/dL (ref 65–99)
Potassium: 3.3 mmol/L — ABNORMAL LOW (ref 3.5–5.1)
Sodium: 142 mmol/L (ref 135–145)

## 2015-10-29 MED ORDER — POTASSIUM CHLORIDE CRYS ER 20 MEQ PO TBCR
20.0000 meq | EXTENDED_RELEASE_TABLET | Freq: Two times a day (BID) | ORAL | Status: DC
  Administered 2015-10-29 – 2015-10-30 (×2): 20 meq via ORAL
  Filled 2015-10-29 (×3): qty 1

## 2015-10-29 MED ORDER — ALLOPURINOL 300 MG PO TABS: 150.0000 mg | ORAL_TABLET | Freq: Every day | ORAL | 6 refills | Status: DC

## 2015-10-29 MED ORDER — POTASSIUM CHLORIDE CRYS ER 20 MEQ PO TBCR
20.0000 meq | EXTENDED_RELEASE_TABLET | Freq: Once | ORAL | Status: AC
  Administered 2015-10-29: 20 meq via ORAL
  Filled 2015-10-29: qty 1

## 2015-10-29 MED ORDER — SALINE SPRAY 0.65 % NA SOLN
1.0000 | NASAL | Status: DC | PRN
  Administered 2015-10-29: 1 via NASAL
  Filled 2015-10-29: qty 44

## 2015-10-29 MED ORDER — ALLOPURINOL 300 MG PO TABS
150.0000 mg | ORAL_TABLET | Freq: Every day | ORAL | Status: DC
  Administered 2015-10-30: 150 mg via ORAL
  Filled 2015-10-29: qty 1

## 2015-10-29 MED ORDER — ALUM & MAG HYDROXIDE-SIMETH 200-200-20 MG/5ML PO SUSP
15.0000 mL | ORAL | Status: DC | PRN
  Administered 2015-10-29: 15 mL via ORAL
  Filled 2015-10-29: qty 30

## 2015-10-29 MED ORDER — LEVOTHYROXINE SODIUM 125 MCG PO TABS: 125.0000 ug | ORAL_TABLET | Freq: Every day | ORAL | 1 refills | Status: DC

## 2015-10-29 MED ORDER — LEVOTHYROXINE SODIUM 25 MCG PO TABS
125.0000 ug | ORAL_TABLET | Freq: Every day | ORAL | Status: DC
  Administered 2015-10-30: 125 ug via ORAL
  Filled 2015-10-29: qty 1

## 2015-10-29 MED ORDER — POTASSIUM CHLORIDE ER 10 MEQ PO TBCR
10.0000 meq | EXTENDED_RELEASE_TABLET | Freq: Every day | ORAL | 0 refills | Status: DC
Start: 2015-10-29 — End: 2015-12-12

## 2015-10-29 NOTE — Progress Notes (Signed)
Patient continues to decline nocturnal CPAP tonight. RT will continue to follow.

## 2015-10-29 NOTE — Progress Notes (Signed)
Patients CBG  58, asymptomatic,no complaints, eating  lunch with orange juice rechecked 57 - 47 ate 100%  given another cup of orange juice CBG 53, rechecked 85.

## 2015-10-29 NOTE — Progress Notes (Signed)
[facility] PROGRESS NOTE  JEWELL RYANS SNK:[id number] DOB: May 19, 1940 DOA: 10/28/2015 PCP: [name], DO   Principal Problem:   CHF exacerbation ([facility]) Active Problems:   Severe obesity (BMI >= 40) (HCC)   COPD GOLD II   OSA on CPAP   Type 2 diabetes mellitus, controlled, with renal complications (HCC)   Chronic kidney disease (CKD) stage G3b/A2, moderately decreased glomerular filtration rate (GFR) between 30-44 mL/min/1.73 square meter and albuminuria creatinine ratio between 30-299 mg/g   Persistent atrial fibrillation (HCC)   Essential hypertension, benign   Cough    Brief summary  75 y.o. male with medical history significant of diastolic CHF, atrial flutter, allergies, CKD stage III, COPD, diabetes mellitus type 2, hypertension, OSA. Patient states that for about the last week he has been having increased dyspnea especially with exertion. He has associated cough with production of clear sputum. His exercise tolerance has diminished. He reports that he has been taking his torsemide, however he does not take it every day as directed as he misses some doses. His diet has remained low in sodium.   ED Course: Hypokalemia with a potassium of 3.0. BMP is up to 393 and troponin is slightly elevated at 0.04. Hemoglobin of 11.5 and creatinine of 1.93 which is above baseline of about 1.5 IChest x-ray significant for vascular congestion  Assessment/Plan:  Acute on chronic diastolic CHF exacerbation. Patient is improving on lasix 40 mg twice a day IV. Cont monitor daily weights, Strict ins and outs. May consider starting a ACEI or ARB when kidney function improved -Fluid restriction: 1200 mL, replace potassium   CKD stage III. Baseline creatinine likely around 1.5-2.0  Hypothyroidism. Last TSH of 39.22 on September 7. Synthroid was increased to 88 mcg at that time. -Repeat TSH-25. D/w patient, increased levothyroxine to 125 mcg. Recommended to repeat  TSH repeated 4-6 weeks    Atrial fibrillation. Continue Eliquis. NSR. Decreased amiodarone due to Prolonged QT  Essential hypertension Does not appear patient is on medication for this. Consider adding an ACEI or ARB as above when kidney function improved  Obstructive sleep apnea CPAP daily at bedtime  Severe obesity Patient will need ongoing nutrition counseling in addition to an exercise regimen  Prolonged QTc  -some improvement in this morning ecg, but still prolonged. D/w [name] who recommended to decrease amiodarone from 200 BID to daily and repeat ECG AM   Code Status: full Family Communication:  D/w patient, his wife  (indicate person spoken with, relationship, and if by phone, the number) Disposition Plan: home 24-48 hrs    Consultants:  Phone with cardiology [name]   Procedures:  none  Antibiotics:  none (indicate start date, and stop date if known)  HPI/Subjective: Alert, no distress, no acute pains   Objective: Vitals:   10/28/15 2223 10/29/15 0500  BP: 131/70 129/71  Pulse: 93 62  Resp: 20 20  Temp: 98 F (36.7 C) 97.8 F (36.6 C)    Intake/Output Summary (Last 24 hours) at 10/29/15 1125 Last data filed at 10/29/15 0612  Gross per 24 hour  Intake              240 ml  Output             1000 ml  Net             -760 ml   Filed Weights   10/28/15 1323 10/28/15 1639 10/29/15 0500  Weight: 108.4 kg (239 lb) 111.4 kg (245 lb  9.5 oz) 110.1 kg (242 lb 11.6 oz)    Exam:   General:  Comfortable   Cardiovascular: s1,s2 rrr  Respiratory: few crackles LL  Abdomen: soft NT  Musculoskeletal: mild edema in legs    Data Reviewed: Basic Metabolic Panel:  Recent Labs Lab 10/28/15 0938 10/29/15 0524  NA 143 142  K 3.0* 3.3*  CL 96* 96*  CO2 35* 37*  GLUCOSE 93 89  BUN 24* 23*  CREATININE 1.93* 1.67*  CALCIUM 9.1 9.2   Liver Function Tests:  Recent Labs Lab 10/28/15 0938  AST 30  ALT 22  ALKPHOS 64  BILITOT 0.8  PROT 7.8  ALBUMIN 4.2   No  results for input(s): LIPASE, AMYLASE in the last 168 hours. No results for input(s): AMMONIA in the last 168 hours. CBC:  Recent Labs Lab 10/28/15 0938  WBC 4.9  NEUTROABS 3.7  HGB 11.5*  HCT 38.0*  MCV 100.8*  PLT 168   Cardiac Enzymes:  Recent Labs Lab 10/28/15 0938  TROPONINI 0.04*   BNP (last 3 results)  Recent Labs  08/18/15 0413 10/28/15 0938  BNP 306.6* 393.6*    ProBNP (last 3 results) No results for input(s): PROBNP in the last 8760 hours.  CBG:  Recent Labs Lab 10/28/15 1707 10/28/15 2207 10/29/15 0810  GLUCAP 96 93 123*    No results found for this or any previous visit (from the past 240 hour(s)).   Studies: Dg Chest 2 View  Result Date: 10/28/2015 CLINICAL DATA:  Cough with white phlegm. Shortness of breath for 1 week. EXAM: CHEST  2 VIEW COMPARISON:  08/18/2015 and 12/25/2012 FINDINGS: Heart size is upper limits of normal and stable. Aortic arch calcifications. Lung markings are mildly prominent. Trachea is midline. Aortic arch calcifications. No significant pleural effusions. No acute bone abnormalities. Stable area of sclerosis or calcification near the anterior right third rib. IMPRESSION: Prominent lung markings are suggestive for vascular congestion or mild interstitial edema. Aortic atherosclerosis. Electronically Signed   By: Markus Daft M.D.   On: 10/28/2015 07:58    Scheduled Meds: . [START ON 10/30/2015] allopurinol  150 mg Oral Daily  . apixaban  5 mg Oral BID  . furosemide  40 mg Intravenous BID  . insulin aspart  0-9 Units Subcutaneous TID WC  . ketorolac  1 drop Right Eye QID  . [START ON 10/30/2015] levothyroxine  125 mcg Oral QAC breakfast  . montelukast  10 mg Oral QHS  . ofloxacin  1 drop Right Eye QID  . potassium chloride  20 mEq Oral Once  . potassium chloride  20 mEq Oral BID  . pravastatin  20 mg Oral q1800  . prednisoLONE acetate  1 drop Right Eye QID  . sodium chloride flush  3 mL Intravenous Q12H   Continuous  Infusions:     Time spent: >35 minutes     Kinnie Feil  [facility] Pager [phone]. If 7PM-7AM, please contact night-coverage at [email or web address], password [facility] 10/29/2015, 11:25 AM  LOS: 0 days

## 2015-10-30 DIAGNOSIS — I10 Essential (primary) hypertension: Secondary | ICD-10-CM

## 2015-10-30 DIAGNOSIS — I481 Persistent atrial fibrillation: Secondary | ICD-10-CM

## 2015-10-30 DIAGNOSIS — I5033 Acute on chronic diastolic (congestive) heart failure: Secondary | ICD-10-CM

## 2015-10-30 DIAGNOSIS — J449 Chronic obstructive pulmonary disease, unspecified: Secondary | ICD-10-CM

## 2015-10-30 LAB — BASIC METABOLIC PANEL
Anion gap: 9 (ref 5–15)
BUN: 23 mg/dL — AB (ref 6–20)
CALCIUM: 9.2 mg/dL (ref 8.9–10.3)
CO2: 34 mmol/L — AB (ref 22–32)
CREATININE: 1.62 mg/dL — AB (ref 0.61–1.24)
Chloride: 95 mmol/L — ABNORMAL LOW (ref 101–111)
GFR calc non Af Amer: 40 mL/min — ABNORMAL LOW (ref >60)
GFR, EST AFRICAN AMERICAN: 46 mL/min — AB (ref >60)
Glucose, Bld: 97 mg/dL (ref 65–99)
Potassium: 4.3 mmol/L (ref 3.5–5.1)
Sodium: 138 mmol/L (ref 135–145)

## 2015-10-30 LAB — GLUCOSE, CAPILLARY
GLUCOSE-CAPILLARY: 102 mg/dL — AB (ref 65–99)
Glucose-Capillary: 87 mg/dL (ref 65–99)

## 2015-10-30 MED ORDER — AMIODARONE HCL 200 MG PO TABS: 200.0000 mg | ORAL_TABLET | Freq: Every day | ORAL | 3 refills | Status: DC

## 2015-10-30 NOTE — Discharge Summary (Signed)
Physician Discharge Summary  Omar Lambert QQP:[id number] DOB: Jun 08, 1940 DOA: 10/28/2015  PCP: [name], DO  Admit date: 10/28/2015 Discharge date: 10/30/2015  Time spent: >35 minutes  Recommendations for Outpatient Follow-up:  F/u with cardiology in 1-2 weeks F/u with PCP in 3-5 days as needed  Discharge Diagnoses:  Principal Problem:   CHF exacerbation ([place]) Active Problems:   Severe obesity (BMI >= 40) (HCC)   COPD GOLD II   OSA on CPAP   Type 2 diabetes mellitus, controlled, with renal complications (HCC)   Chronic kidney disease (CKD) stage G3b/A2, moderately decreased glomerular filtration rate (GFR) between 30-44 mL/min/1.73 square meter and albuminuria creatinine ratio between 30-299 mg/g   Persistent atrial fibrillation (HCC)   Essential hypertension, benign   Cough   Acute CHF [facility])   Discharge Condition: stable   Diet recommendation: DM. Low sodium   Filed Weights   10/28/15 1639 10/29/15 0500 10/30/15 0700  Weight: 111.4 kg (245 lb 9.5 oz) 110.1 kg (242 lb 11.6 oz) 110.5 kg (243 lb 9.7 oz)    History of present illness:  75 y.o.malewith medical history significant of diastolic CHF, atrial flutter, allergies, CKD stage III, COPD, diabetes mellitus type 2, hypertension, OSA. Patient states that for about the last week he has been having increased dyspnea especially with exertion. He has associated cough with production of clear sputum. His exercise tolerance has diminished. He reports that he has been taking his torsemide, however he does not take it every day as directed as he misses some doses. His diet has remained low in sodium.   ED Course: Hypokalemia with a potassium of 3.0. BMP is up to 393 and troponin is slightly elevated at 0.04. Hemoglobin of 11.5 and creatinine of 1.93 which is above baseline of about 1.5 IChest x-ray significant for vascular congestion   Hospital Course:   Acute on chronic diastolic CHF exacerbation. Patient responded well  to lasix 40 mg twice a day IV then transitioned to oral regimen. May consider starting a ACEI or ARB when kidney function improved as outpatient   CKD stage III. Baseline creatinine likely around 1.5-2.0  Hypothyroidism. Last TSH of 39.22 on September 7. Synthroid was increased to 88 mcg at that time. -Repeat TSH-25. D/w patient, increased levothyroxine to 125 mcg. Recommended to repeat  TSH repeated 4-6 weeks   Atrial fibrillation. Continue Eliquis. NSR. Decreased amiodarone due to Prolonged QT  Essential hypertension Does not appear patient is on medication for this. Consider adding an ACEI or ARB as above when kidney function improved  Obstructive sleep apnea CPAP daily at bedtime  Severe obesity Patient will need ongoing nutrition counseling in addition to an exercise regimen  Prolonged QTc on admission.  D/w [name] who recommended to decrease amiodarone from 200 BID to daily and repeat ECG showed improvement in QT. Recommended to f/u with cardiology in 1-2 weeks    Procedures:  none.  (i.e. Studies not automatically included, echos, thoracentesis, etc; not x-rays)  Consultations:  Phone consult cardiology   Discharge Exam: Vitals:   10/29/15 2123 10/30/15 0700  BP: 121/86 138/76  Pulse: 63 65  Resp: 18 18  Temp: 98.5 F (36.9 C) 97.7 F (36.5 C)    General: alert. No distress  Cardiovascular: s1,s2 rrr Respiratory: CTA BL  Discharge Instructions  Discharge Instructions    Call MD for:  persistant dizziness or light-headedness    Complete by:  As directed    Diet - low sodium heart healthy  Complete by:  As directed    Diet - low sodium heart healthy    Complete by:  As directed    Discharge instructions    Complete by:  As directed    Please follow up with primary care doctor in 1 week.   Discharge instructions    Complete by:  As directed    Please follow up with cardiologist in 1-2 weeks   Increase activity slowly    Complete by:  As  directed    Increase activity slowly    Complete by:  As directed        Medication List    STOP taking these medications   ibuprofen 200 MG tablet Commonly known as:  ADVIL,MOTRIN     TAKE these medications   albuterol (2.5 MG/3ML) 0.083% nebulizer solution Commonly known as:  PROVENTIL INHALE CONTENTS OF 1 VIAL IN NEBULIZER EVERY 4 HOURS AS NEEDED FOR WHEEZING AND ASTHMA   allopurinol 300 MG tablet Commonly known as:  ZYLOPRIM Take 0.5 tablets (150 mg total) by mouth daily. What changed:  how much to take   amiodarone 200 MG tablet Commonly known as:  PACERONE Take 1 tablet (200 mg total) by mouth daily. What changed:  when to take this   apixaban 5 MG Tabs tablet Commonly known as:  ELIQUIS Take 1 tablet (5 mg total) by mouth 2 (two) times daily.   CENTRUM SILVER ADULT 50+ PO Take 1 tablet by mouth daily.   ketorolac 0.4 % Soln Commonly known as:  ACULAR Place 1 drop into the right eye 4 (four) times daily.   levothyroxine 125 MCG tablet Commonly known as:  SYNTHROID Take 1 tablet (125 mcg total) by mouth daily before breakfast. What changed:  medication strength  how much to take   linagliptin 5 MG Tabs tablet Commonly known as:  TRADJENTA Take 1 tablet (5 mg total) by mouth daily.   montelukast 10 MG tablet Commonly known as:  SINGULAIR Take one tablet by mouth at bedtime as needed for allergies and asthma   ofloxacin 0.3 % ophthalmic solution Commonly known as:  OCUFLOX Place 1 drop into the right eye 4 (four) times daily.   potassium chloride 10 MEQ tablet Commonly known as:  K-DUR Take 1 tablet (10 mEq total) by mouth daily.   pravastatin 20 MG tablet Commonly known as:  PRAVACHOL TAKE 1 TABLET BY MOUTH EVERY DAY WITH SUPPER   prednisoLONE acetate 1 % ophthalmic suspension Commonly known as:  PRED FORTE Place 1 drop into the right eye 4 (four) times daily.   torsemide 20 MG tablet Commonly known as:  DEMADEX Take 1 tablet (20 mg  total) by mouth 2 (two) times daily. Heart failure      No Known Allergies    The results of significant diagnostics from this hospitalization (including imaging, microbiology, ancillary and laboratory) are listed below for reference.    Significant Diagnostic Studies: Dg Chest 2 View  Result Date: 10/28/2015 CLINICAL DATA:  Cough with white phlegm. Shortness of breath for 1 week. EXAM: CHEST  2 VIEW COMPARISON:  08/18/2015 and 12/25/2012 FINDINGS: Heart size is upper limits of normal and stable. Aortic arch calcifications. Lung markings are mildly prominent. Trachea is midline. Aortic arch calcifications. No significant pleural effusions. No acute bone abnormalities. Stable area of sclerosis or calcification near the anterior right third rib. IMPRESSION: Prominent lung markings are suggestive for vascular congestion or mild interstitial edema. Aortic atherosclerosis. Electronically Signed   By: Quita Skye  Anselm Pancoast M.D.   On: 10/28/2015 07:58    Microbiology: No results found for this or any previous visit (from the past 240 hour(s)).   Labs: Basic Metabolic Panel:  Recent Labs Lab 10/28/15 0938 10/29/15 0524 10/30/15 0526  NA 143 142 138  K 3.0* 3.3* 4.3  CL 96* 96* 95*  CO2 35* 37* 34*  GLUCOSE 93 89 97  BUN 24* 23* 23*  CREATININE 1.93* 1.67* 1.62*  CALCIUM 9.1 9.2 9.2   Liver Function Tests:  Recent Labs Lab 10/28/15 0938  AST 30  ALT 22  ALKPHOS 64  BILITOT 0.8  PROT 7.8  ALBUMIN 4.2   No results for input(s): LIPASE, AMYLASE in the last 168 hours. No results for input(s): AMMONIA in the last 168 hours. CBC:  Recent Labs Lab 10/28/15 0938  WBC 4.9  NEUTROABS 3.7  HGB 11.5*  HCT 38.0*  MCV 100.8*  PLT 168   Cardiac Enzymes:  Recent Labs Lab 10/28/15 0938  TROPONINI 0.04*   BNP: BNP (last 3 results)  Recent Labs  08/18/15 0413 10/28/15 0938  BNP 306.6* 393.6*    ProBNP (last 3 results) No results for input(s): PROBNP in the last 8760  hours.  CBG:  Recent Labs Lab 10/29/15 1641 10/29/15 1743 10/29/15 2128 10/30/15 0437 10/30/15 0719  GLUCAP 66 99 75 87 102*       Signed:  Misty Foutz N  [facility] 10/30/2015, 10:19 AM

## 2015-10-31 ENCOUNTER — Ambulatory Visit: Payer: Commercial Managed Care - HMO | Admitting: Nurse Practitioner

## 2015-10-31 ENCOUNTER — Telehealth: Payer: Self-pay | Admitting: *Deleted

## 2015-10-31 NOTE — Telephone Encounter (Signed)
Tried calling patient to schedule a [facility] Follow up. LMOM to return call.

## 2015-11-01 ENCOUNTER — Telehealth: Payer: Self-pay | Admitting: *Deleted

## 2015-11-01 NOTE — Telephone Encounter (Signed)
TOC-Transition of Care  Patient was admitted on: 0/22/2017 Patient was discharged on: 10/30/2015 Patient was admitted due to: CHF  1. Patient has medication and knows how to take them: Yes, medication list went over with patient and he understands the directions  2. Education on use and side effects of any new medication: Patient is aware of side effects and instructed on how to use his Levothyroxine, take on an empty stomach 30 minutes before breakfast for his thyroid.  3. Barriers to taking Medication: No Barriers  4. Patient reminded to call us if any needs arise: Yes  5. Follow up Visit Scheduled: Appointment for 11/03/2015 with Sherrie Mustache, NP

## 2015-11-03 ENCOUNTER — Encounter: Payer: Self-pay | Admitting: Nurse Practitioner

## 2015-11-03 ENCOUNTER — Ambulatory Visit (INDEPENDENT_AMBULATORY_CARE_PROVIDER_SITE_OTHER): Payer: Commercial Managed Care - HMO | Admitting: Nurse Practitioner

## 2015-11-03 VITALS — BP 132/84 | HR 66 | Temp 97.7°F | Resp 17 | Ht 66.0 in | Wt 240.8 lb

## 2015-11-03 DIAGNOSIS — J449 Chronic obstructive pulmonary disease, unspecified: Secondary | ICD-10-CM

## 2015-11-03 DIAGNOSIS — D539 Nutritional anemia, unspecified: Secondary | ICD-10-CM

## 2015-11-03 DIAGNOSIS — N183 Chronic kidney disease, stage 3 (moderate): Secondary | ICD-10-CM | POA: Diagnosis not present

## 2015-11-03 DIAGNOSIS — I4819 Other persistent atrial fibrillation: Secondary | ICD-10-CM

## 2015-11-03 DIAGNOSIS — I5031 Acute diastolic (congestive) heart failure: Secondary | ICD-10-CM

## 2015-11-03 DIAGNOSIS — E039 Hypothyroidism, unspecified: Secondary | ICD-10-CM

## 2015-11-03 DIAGNOSIS — I481 Persistent atrial fibrillation: Secondary | ICD-10-CM

## 2015-11-03 DIAGNOSIS — N1832 Chronic kidney disease, stage 3b: Secondary | ICD-10-CM

## 2015-11-03 DIAGNOSIS — I10 Essential (primary) hypertension: Secondary | ICD-10-CM

## 2015-11-03 LAB — CBC WITH DIFFERENTIAL/PLATELET
BASOS ABS: 0 {cells}/uL (ref 0–200)
BASOS PCT: 0 %
EOS PCT: 5 %
Eosinophils Absolute: 295 cells/uL (ref 15–500)
HCT: 37.2 % — ABNORMAL LOW (ref 38.5–50.0)
HEMOGLOBIN: 12.1 g/dL — AB (ref 13.2–17.1)
LYMPHS ABS: 944 {cells}/uL (ref 850–3900)
Lymphocytes Relative: 16 %
MCH: 30.8 pg (ref 27.0–33.0)
MCHC: 32.5 g/dL (ref 32.0–36.0)
MCV: 94.7 fL (ref 80.0–100.0)
MONOS PCT: 9 %
MPV: 11.6 fL (ref 7.5–12.5)
Monocytes Absolute: 531 cells/uL (ref 200–950)
NEUTROS ABS: 4130 {cells}/uL (ref 1500–7800)
Neutrophils Relative %: 70 %
PLATELETS: 240 10*3/uL (ref 140–400)
RBC: 3.93 MIL/uL — AB (ref 4.20–5.80)
RDW: 15 % (ref 11.0–15.0)
WBC: 5.9 10*3/uL (ref 3.8–10.8)

## 2015-11-03 LAB — BASIC METABOLIC PANEL
BUN: 30 mg/dL — ABNORMAL HIGH (ref 7–25)
CALCIUM: 9.4 mg/dL (ref 8.6–10.3)
CO2: 28 mmol/L (ref 20–31)
CREATININE: 2.03 mg/dL — AB (ref 0.70–1.18)
Chloride: 98 mmol/L (ref 98–110)
Glucose, Bld: 87 mg/dL (ref 65–99)
Potassium: 4.2 mmol/L (ref 3.5–5.3)
SODIUM: 140 mmol/L (ref 135–146)

## 2015-11-03 LAB — VITAMIN B12: VITAMIN B 12: 407 pg/mL (ref 200–1100)

## 2015-11-03 MED ORDER — FLUTICASONE FUROATE-VILANTEROL 100-25 MCG/INH IN AEPB: 1.0000 | INHALATION_SPRAY | Freq: Every day | RESPIRATORY_TRACT | Status: DC

## 2015-11-03 NOTE — Patient Instructions (Addendum)
Change Lab Appt for follow up thyroid test to Nov 3  To call and make cardiology appt  Keep follow up with Dr Mariea Clonts

## 2015-11-03 NOTE — Progress Notes (Signed)
Careteam: Patient Care Team: Omar Curry, DO as PCP - General (Geriatric Medicine) Omar Sine, MD as Consulting Physician (Cardiology) Omar Bears, MD as Consulting Physician (Gastroenterology) Omar Rockers, MD as Consulting Physician (Pulmonary Disease) Omar Jacks, MD as Consulting Physician (Ophthalmology)  Advanced Directive information Does patient have an advance directive?: No  No Known Allergies  Chief Complaint  Patient presents with  . Other    TOC-Hospital follow up     HPI: Patient is a 75 y.o. male seen in the office today for follow up hospitalization. Pt with medical history significant of diastolic CHF, atrial flutter, allergies, CKD stage III, COPD, diabetes mellitus type 2, hypertension, OSA. Patient went to hospital due to dyspnea that for about the last week he has been progressive especially with exertion. He has associated cough with production of clear sputum. His exercise tolerance has diminished. He reported that he has been taking his torsemide, however he had miss some dose.  Chest x-ray was significant for vascular congestion and pt was treated for acute on chronic diastolic CHF and responded well to IV lasix was transitioned to Demadex 20 mg by mouth twice daily- reports he is consistent with taking this medication. Also has been added on potassium supplement It was also noted pt had prolonged QTc on admission.  amiodarone was decreased from 200 BID to daily and repeat ECG showed improvement in QT. Recommended to f/u with cardiology in 1-2 weeks - pt has not made appt yet.   Hypothyroidism: Last TSH of 39.22 on September 7. Synthroid was increased to 88 mcg at that time. Repeat TSH in hospital was 25 and during hospitalization it was again increased to 125 mcg.  Reports he has started some exercise on his own. Does not have therapy- but remembers what they taught him from previous hospitalization Walking and exercises per PT  Using nebulizer  twice a day- feels like he needs it twice daily, brings up his sputum.  Some white sputum, some yellow Was seen by pulmonary in 2014 - per note PFTs 12/25/2012 FEV1  1.44 (58%) ratio 69 and no change p B2 p last Breo am of pft's  He barely meets criteria for copd but is definitely improved ex tol on breo so needs to stay on it if insurance allows and the throat clearing, which he says he also noted on advair, is tolerable, otherwise consider change to symbicort 160 2bid Pt reports he does not feel like he needs nebulizer or inhaler but does admit it helps him breath better. Reports he has exertional dyspnea only and questions if he needs medication for this even though it helps him.   Review of Systems:  Review of Systems  Constitutional: Negative for activity change, appetite change, fatigue and unexpected weight change.  HENT: Negative for congestion and hearing loss.   Eyes: Negative.   Respiratory: Positive for shortness of breath (with exertion). Negative for cough.   Cardiovascular: Negative for chest pain, palpitations and leg swelling.  Gastrointestinal: Negative for abdominal pain, constipation and diarrhea.  Genitourinary: Negative for difficulty urinating and dysuria.  Musculoskeletal: Negative for arthralgias and myalgias.  Skin: Negative for color change and wound.  Neurological: Negative for dizziness and weakness.  Psychiatric/Behavioral: Negative for agitation, behavioral problems and confusion.    Past Medical History:  Diagnosis Date  . Allergy   . Asthma   . Atrophic gastritis   . Benign essential hypertension   . COPD (chronic obstructive pulmonary disease) ([place])   .  Diabetes ([place])   . GERD (gastroesophageal reflux disease)   . Gout attack 09/2012  . Sinusitis, maxillary, chronic   . Type II or unspecified type diabetes mellitus without mention of complication, not stated as uncontrolled    Past Surgical History:  Procedure Laterality Date  . COLONOSCOPY  2003    . HERNIA REPAIR  1980   Social History:   reports that he quit smoking about 18 years ago. His smoking use included Cigarettes. He has a 20.00 pack-year smoking history. He has never used smokeless tobacco. He reports that he does not drink alcohol or use drugs.  Family History  Problem Relation Age of Onset  . Stroke Mother   . Cancer Sister   . Diabetes Sister   . COPD Sister   . COPD Sister     Medications: Patient's Medications  New Prescriptions   No medications on file  Previous Medications   ALBUTEROL (PROVENTIL) (2.5 MG/3ML) 0.083% NEBULIZER SOLUTION    INHALE CONTENTS OF 1 VIAL IN NEBULIZER EVERY 4 HOURS AS NEEDED FOR WHEEZING AND ASTHMA   ALLOPURINOL (ZYLOPRIM) 300 MG TABLET    Take 0.5 tablets (150 mg total) by mouth daily.   AMIODARONE (PACERONE) 200 MG TABLET    Take 1 tablet (200 mg total) by mouth daily.   APIXABAN (ELIQUIS) 5 MG TABS TABLET    Take 1 tablet (5 mg total) by mouth 2 (two) times daily.   FLUTICASONE (FLONASE) 50 MCG/ACT NASAL SPRAY    Place 1 spray into both nostrils daily as needed for allergies.    KETOROLAC (ACULAR) 0.4 % SOLN    Place 1 drop into the right eye 4 (four) times daily.    LEVOTHYROXINE (SYNTHROID) 125 MCG TABLET    Take 1 tablet (125 mcg total) by mouth daily before breakfast.   LINAGLIPTIN (TRADJENTA) 5 MG TABS TABLET    Take 1 tablet (5 mg total) by mouth daily.   MONTELUKAST (SINGULAIR) 10 MG TABLET    Take one tablet by mouth at bedtime as needed for allergies and asthma   MULTIPLE VITAMINS-MINERALS (CENTRUM SILVER ADULT 50+ PO)    Take 1 tablet by mouth daily.   OFLOXACIN (OCUFLOX) 0.3 % OPHTHALMIC SOLUTION    Place 1 drop into the right eye 4 (four) times daily.    POTASSIUM CHLORIDE (K-DUR) 10 MEQ TABLET    Take 1 tablet (10 mEq total) by mouth daily.   PRAVASTATIN (PRAVACHOL) 20 MG TABLET    TAKE 1 TABLET BY MOUTH EVERY DAY WITH SUPPER   PREDNISOLONE ACETATE (PRED FORTE) 1 % OPHTHALMIC SUSPENSION    Place 1 drop into the right  eye 4 (four) times daily.    TORSEMIDE (DEMADEX) 20 MG TABLET    Take 1 tablet (20 mg total) by mouth 2 (two) times daily. Heart failure  Modified Medications   No medications on file  Discontinued Medications   No medications on file     Physical Exam:  Vitals:   11/03/15 0850  BP: 132/84  Pulse: 66  Resp: 17  Temp: 97.7 F (36.5 C)  TempSrc: Oral  SpO2: 93%  Weight: 240 lb 12.8 oz (109.2 kg)  Height: 5\' 6"  (1.676 m)   Body mass index is 38.87 kg/m.  Physical Exam  Constitutional: He is oriented to person, place, and time. He appears well-developed and well-nourished. No distress.  HENT:  Head: Normocephalic and atraumatic.  Cardiovascular: Normal rate and normal heart sounds.   irreg irreg  Pulmonary/Chest: Effort  normal and breath sounds normal. No respiratory distress. He has no wheezes.  Abdominal: Soft. Bowel sounds are normal.  Musculoskeletal: Normal range of motion.  Neurological: He is alert and oriented to person, place, and time. He displays normal reflexes. He exhibits normal muscle tone. Coordination normal.  Skin: Skin is warm and dry.  Psychiatric: He has a normal mood and affect.    Labs reviewed: Basic Metabolic Panel:  Recent Labs  12/13/14 0856  08/24/15 0825 10/13/15 0927 10/28/15 0938 10/28/15 1214 10/29/15 0524 10/30/15 0526  NA 142  < > 141  --  143  --  142 138  K 4.2  < > 3.9  --  3.0*  --  3.3* 4.3  CL 98  < > 93*  --  96*  --  96* 95*  CO2 31  < > 38*  --  35*  --  37* 34*  GLUCOSE 84  < > 107*  --  93  --  89 97  BUN 24  < > 27*  --  24*  --  23* 23*  CREATININE 1.36*  < > 1.77*  --  1.93*  --  1.67* 1.62*  CALCIUM 9.0  < > 9.2  --  9.1  --  9.2 9.2  MG 1.8  --   --   --   --   --   --   --   TSH 3.535  < > 44.35* 39.22*  --  25.108*  --   --   < > = values in this interval not displayed. Liver Function Tests:  Recent Labs  08/18/15 0413 08/24/15 0825 10/28/15 0938  AST 49* 28 30  ALT 27 22 22   ALKPHOS 76 74 64    BILITOT 0.7 0.8 0.8  PROT 7.9 7.5 7.8  ALBUMIN 4.0 3.9 4.2   No results for input(s): LIPASE, AMYLASE in the last 8760 hours. No results for input(s): AMMONIA in the last 8760 hours. CBC:  Recent Labs  08/18/15 0413 08/20/15 0415 08/24/15 0825 10/28/15 0938  WBC 6.5 8.0 8.4 4.9  NEUTROABS 4.9  --  6,720 3.7  HGB 11.9* 12.4* 13.4 11.5*  HCT 38.1* 40.9 41.8 38.0*  MCV 98.7 98.6 95.2 100.8*  PLT 238 274 253 168   Lipid Panel:  Recent Labs  02/25/15 0949 05/25/15 0950 08/24/15 0825  CHOL 171 151 163  HDL 65 68 83  LDLCALC 95 68 69  TRIG 55 76 55  CHOLHDL 2.6 2.2 2.0   TSH:  Recent Labs  08/24/15 0825 10/13/15 0927 10/28/15 1214  TSH 44.35* 39.22* 25.108*   A1C: Lab Results  Component Value Date   HGBA1C 6.3 (H) 08/24/2015     Assessment/Plan 1. Acute diastolic congestive heart failure ([place]) -improved at this time. Pt with ongoing weight loss. Currently have home health nurse following up with them. 2 hospitalizations since July for CHF, will refer to CHF clinic. Aware they need to call for weight gain but could not tell me who they needed to call. Educated to call our office or home health nurse. Educated on importance of medication and diet compliance. - AMB referral to CHF clinic for ongoing follow up  2. Benign essential hypertension Blood pressure stable at this time, on diuretic only for this at this time.  3. Persistent atrial fibrillation ([facility]) Rate controlled, prolonged qt during hospitalization and amiodarone was reduced. Plan to follow up with cardiology  4. COPD GOLD II Long discussion regarding COPD and nebulizer  treatments. Pt has cont twice daily albuterol because he was getting nebulizer in the hospital. Educated that he was to take as needed only. Pt not on maintanence medication for COPD. Pt reports he does not feel like he needs medication but reports breathing has improved with medication in the past and now with nebulizer. Discussed  risk/adverse effects of long term albuterol and to use as needed only. Pt does not wish to go back to pulmonary office. Willing to try breo twice daily again.  - fluticasone furoate-vilanterol (BREO ELLIPTA) 100-25 MCG/INH AEPB; Inhale 1 puff into the lungs daily.  5. Chronic kidney disease (CKD) stage G3b/A2, moderately decreased glomerular filtration rate (GFR) between 30-44 mL/min/1.73 square meter and albuminuria creatinine ratio between 30-299 mg/g - Basic metabolic panel  6. Hypothyroidism  Synthroid was again adjusted in the hospital from 88 to 125 mcg. Pt has TSH follow up lab scheduled  7. Macrocytic anemia Noted during hospitalization, will follow up labs  - CBC with Differential/Platelets - Vitamin B12   Jessica K. Harle Battiest  [facility] [phone] 8 am - 5 pm) [phone] (after hours)

## 2015-11-15 DIAGNOSIS — G4733 Obstructive sleep apnea (adult) (pediatric): Secondary | ICD-10-CM | POA: Diagnosis not present

## 2015-11-17 ENCOUNTER — Telehealth (HOSPITAL_COMMUNITY): Payer: Self-pay | Admitting: Vascular Surgery

## 2015-11-17 NOTE — Telephone Encounter (Signed)
Pt does not want to make an appt here, he states he already has cardiologist

## 2015-11-21 ENCOUNTER — Other Ambulatory Visit: Payer: Self-pay | Admitting: Internal Medicine

## 2015-11-21 ENCOUNTER — Other Ambulatory Visit: Payer: Self-pay | Admitting: Cardiovascular Disease

## 2015-11-21 DIAGNOSIS — M1A9XX0 Chronic gout, unspecified, without tophus (tophi): Secondary | ICD-10-CM

## 2015-12-02 ENCOUNTER — Other Ambulatory Visit: Payer: Self-pay

## 2015-12-09 ENCOUNTER — Other Ambulatory Visit: Payer: Self-pay | Admitting: Nurse Practitioner

## 2015-12-09 ENCOUNTER — Other Ambulatory Visit: Payer: Commercial Managed Care - HMO

## 2015-12-09 DIAGNOSIS — I10 Essential (primary) hypertension: Secondary | ICD-10-CM

## 2015-12-09 DIAGNOSIS — E039 Hypothyroidism, unspecified: Secondary | ICD-10-CM | POA: Diagnosis not present

## 2015-12-09 LAB — BASIC METABOLIC PANEL WITH GFR
BUN: 32 mg/dL — ABNORMAL HIGH (ref 7–25)
CO2: 33 mmol/L — ABNORMAL HIGH (ref 20–31)
Calcium: 9.3 mg/dL (ref 8.6–10.3)
Chloride: 97 mmol/L — ABNORMAL LOW (ref 98–110)
Creat: 1.89 mg/dL — ABNORMAL HIGH (ref 0.70–1.18)
GFR, EST NON AFRICAN AMERICAN: 34 mL/min — AB (ref >=60)
GFR, Est African American: 39 mL/min — ABNORMAL LOW (ref >=60)
Glucose, Bld: 84 mg/dL (ref 65–99)
POTASSIUM: 3.4 mmol/L — AB (ref 3.5–5.3)
SODIUM: 142 mmol/L (ref 135–146)

## 2015-12-09 LAB — TSH: TSH: 7.63 m[IU]/L — AB (ref 0.40–4.50)

## 2015-12-12 ENCOUNTER — Other Ambulatory Visit: Payer: Self-pay

## 2015-12-12 DIAGNOSIS — E876 Hypokalemia: Secondary | ICD-10-CM

## 2015-12-12 DIAGNOSIS — R7989 Other specified abnormal findings of blood chemistry: Secondary | ICD-10-CM

## 2015-12-12 MED ORDER — POTASSIUM CHLORIDE CRYS ER 20 MEQ PO TBCR: 20.0000 meq | EXTENDED_RELEASE_TABLET | Freq: Every day | ORAL | 1 refills | Status: DC

## 2015-12-12 MED ORDER — LEVOTHYROXINE SODIUM 137 MCG PO TABS: 137.0000 ug | ORAL_TABLET | Freq: Every day | ORAL | 1 refills | Status: DC

## 2015-12-26 ENCOUNTER — Ambulatory Visit: Payer: Commercial Managed Care - HMO | Admitting: Cardiovascular Disease

## 2016-01-17 ENCOUNTER — Other Ambulatory Visit: Payer: Commercial Managed Care - HMO

## 2016-01-17 DIAGNOSIS — R7989 Other specified abnormal findings of blood chemistry: Secondary | ICD-10-CM

## 2016-01-17 DIAGNOSIS — R946 Abnormal results of thyroid function studies: Secondary | ICD-10-CM | POA: Diagnosis not present

## 2016-01-17 DIAGNOSIS — E876 Hypokalemia: Secondary | ICD-10-CM | POA: Diagnosis not present

## 2016-01-17 LAB — TSH: TSH: 4.92 mIU/L — ABNORMAL HIGH (ref 0.40–4.50)

## 2016-01-18 LAB — BASIC METABOLIC PANEL
BUN: 18 mg/dL (ref 7–25)
CO2: 31 mmol/L (ref 20–31)
Calcium: 9.2 mg/dL (ref 8.6–10.3)
Chloride: 103 mmol/L (ref 98–110)
Creat: 1.76 mg/dL — ABNORMAL HIGH (ref 0.70–1.18)
Glucose, Bld: 85 mg/dL (ref 65–99)
Potassium: 4 mmol/L (ref 3.5–5.3)
Sodium: 142 mmol/L (ref 135–146)

## 2016-01-20 ENCOUNTER — Encounter: Payer: Self-pay | Admitting: *Deleted

## 2016-01-20 ENCOUNTER — Telehealth: Payer: Self-pay | Admitting: *Deleted

## 2016-01-20 DIAGNOSIS — R7989 Other specified abnormal findings of blood chemistry: Secondary | ICD-10-CM

## 2016-01-20 MED ORDER — LEVOTHYROXINE SODIUM 150 MCG PO TABS: 150.0000 ug | ORAL_TABLET | Freq: Every day | ORAL | 1 refills | Status: DC

## 2016-01-20 NOTE — Telephone Encounter (Signed)
Spoke with patient and he's taking his medication everyday, sent new rx to pharmacy, also pt will get labs on his next visit at 03/01/16. letter mailed to patients home address with results.

## 2016-01-20 NOTE — Telephone Encounter (Signed)
-----   Message from Gayland Curry, DO sent at 01/19/2016 12:30 PM EST ----- Is he taking his thyroid medication daily?  If so, we need to increase his dosage b/c his TSH is still high.  Let's go up to 135mcg daily.   If not, he needs to take it faithfully. Either way, put in recheck TSH in 6 weeks.

## 2016-01-21 ENCOUNTER — Other Ambulatory Visit: Payer: Self-pay | Admitting: Internal Medicine

## 2016-02-05 ENCOUNTER — Other Ambulatory Visit: Payer: Self-pay | Admitting: Internal Medicine

## 2016-02-05 DIAGNOSIS — R7989 Other specified abnormal findings of blood chemistry: Secondary | ICD-10-CM

## 2016-02-05 DIAGNOSIS — E876 Hypokalemia: Secondary | ICD-10-CM

## 2016-02-08 ENCOUNTER — Other Ambulatory Visit: Payer: Self-pay | Admitting: Internal Medicine

## 2016-02-16 ENCOUNTER — Other Ambulatory Visit: Payer: Self-pay | Admitting: Cardiovascular Disease

## 2016-02-28 ENCOUNTER — Ambulatory Visit (INDEPENDENT_AMBULATORY_CARE_PROVIDER_SITE_OTHER): Payer: Commercial Managed Care - HMO | Admitting: Cardiovascular Disease

## 2016-02-28 ENCOUNTER — Encounter: Payer: Self-pay | Admitting: Cardiovascular Disease

## 2016-02-28 ENCOUNTER — Other Ambulatory Visit: Payer: Commercial Managed Care - HMO

## 2016-02-28 DIAGNOSIS — I10 Essential (primary) hypertension: Secondary | ICD-10-CM

## 2016-02-28 DIAGNOSIS — I872 Venous insufficiency (chronic) (peripheral): Secondary | ICD-10-CM

## 2016-02-28 DIAGNOSIS — Z9989 Dependence on other enabling machines and devices: Secondary | ICD-10-CM

## 2016-02-28 DIAGNOSIS — I480 Paroxysmal atrial fibrillation: Secondary | ICD-10-CM

## 2016-02-28 DIAGNOSIS — G4733 Obstructive sleep apnea (adult) (pediatric): Secondary | ICD-10-CM

## 2016-02-28 DIAGNOSIS — E1121 Type 2 diabetes mellitus with diabetic nephropathy: Secondary | ICD-10-CM | POA: Diagnosis not present

## 2016-02-28 DIAGNOSIS — E1169 Type 2 diabetes mellitus with other specified complication: Secondary | ICD-10-CM

## 2016-02-28 DIAGNOSIS — I481 Persistent atrial fibrillation: Secondary | ICD-10-CM

## 2016-02-28 DIAGNOSIS — R600 Localized edema: Secondary | ICD-10-CM

## 2016-02-28 DIAGNOSIS — E669 Obesity, unspecified: Secondary | ICD-10-CM

## 2016-02-28 DIAGNOSIS — I4819 Other persistent atrial fibrillation: Secondary | ICD-10-CM

## 2016-02-28 DIAGNOSIS — E785 Hyperlipidemia, unspecified: Secondary | ICD-10-CM

## 2016-02-28 DIAGNOSIS — E039 Hypothyroidism, unspecified: Secondary | ICD-10-CM

## 2016-02-28 NOTE — Patient Instructions (Signed)
Your physician recommends that you return for lab work  next week fasting.  Your physician recommends that you schedule a follow-up appointment in:  With your primary care physician.  Your physician has recommended you take 40 mg of the Demadex ( torsemide) when you return home today.  Your physician recommends that you schedule a follow-up appointment in: 4 months with [name].

## 2016-03-01 ENCOUNTER — Other Ambulatory Visit: Payer: Medicare HMO

## 2016-03-01 ENCOUNTER — Ambulatory Visit (INDEPENDENT_AMBULATORY_CARE_PROVIDER_SITE_OTHER): Payer: Medicare HMO

## 2016-03-01 VITALS — BP 132/68 | HR 81 | Temp 98.1°F | Ht 66.0 in | Wt 235.0 lb

## 2016-03-01 DIAGNOSIS — E032 Hypothyroidism due to medicaments and other exogenous substances: Secondary | ICD-10-CM | POA: Diagnosis not present

## 2016-03-01 DIAGNOSIS — IMO0002 Reserved for concepts with insufficient information to code with codable children: Secondary | ICD-10-CM

## 2016-03-01 DIAGNOSIS — R471 Dysarthria and anarthria: Secondary | ICD-10-CM | POA: Diagnosis not present

## 2016-03-01 DIAGNOSIS — Z0000 Encounter for general adult medical examination without abnormal findings: Secondary | ICD-10-CM

## 2016-03-01 DIAGNOSIS — E1121 Type 2 diabetes mellitus with diabetic nephropathy: Secondary | ICD-10-CM

## 2016-03-01 DIAGNOSIS — I10 Essential (primary) hypertension: Secondary | ICD-10-CM | POA: Diagnosis not present

## 2016-03-01 DIAGNOSIS — I5033 Acute on chronic diastolic (congestive) heart failure: Secondary | ICD-10-CM | POA: Diagnosis not present

## 2016-03-01 DIAGNOSIS — I639 Cerebral infarction, unspecified: Secondary | ICD-10-CM | POA: Diagnosis not present

## 2016-03-01 LAB — CBC WITH DIFFERENTIAL/PLATELET
Basophils Absolute: 0 cells/uL (ref 0–200)
Basophils Relative: 0 %
Eosinophils Absolute: 72 cells/uL (ref 15–500)
Eosinophils Relative: 2 %
HCT: 40.9 % (ref 38.5–50.0)
Hemoglobin: 13 g/dL — ABNORMAL LOW (ref 13.2–17.1)
Lymphocytes Relative: 26 %
Lymphs Abs: 936 cells/uL (ref 850–3900)
MCH: 30.2 pg (ref 27.0–33.0)
MCHC: 31.8 g/dL — ABNORMAL LOW (ref 32.0–36.0)
MCV: 95.1 fL (ref 80.0–100.0)
MPV: 11.2 fL (ref 7.5–12.5)
Monocytes Absolute: 432 cells/uL (ref 200–950)
Monocytes Relative: 12 %
Neutro Abs: 2160 cells/uL (ref 1500–7800)
Neutrophils Relative %: 60 %
Platelets: 212 10*3/uL (ref 140–400)
RBC: 4.3 MIL/uL (ref 4.20–5.80)
RDW: 15.6 % — ABNORMAL HIGH (ref 11.0–15.0)
WBC: 3.6 10*3/uL — ABNORMAL LOW (ref 3.8–10.8)

## 2016-03-01 LAB — COMPLETE METABOLIC PANEL WITH GFR
ALT: 25 U/L (ref 9–46)
AST: 40 U/L — ABNORMAL HIGH (ref 10–35)
Albumin: 4.2 g/dL (ref 3.6–5.1)
Alkaline Phosphatase: 113 U/L (ref 40–115)
BUN: 32 mg/dL — ABNORMAL HIGH (ref 7–25)
CO2: 33 mmol/L — ABNORMAL HIGH (ref 20–31)
Calcium: 9.3 mg/dL (ref 8.6–10.3)
Chloride: 98 mmol/L (ref 98–110)
Creat: 1.97 mg/dL — ABNORMAL HIGH (ref 0.70–1.18)
GFR, Est African American: 37 mL/min — ABNORMAL LOW (ref >=60)
GFR, Est Non African American: 32 mL/min — ABNORMAL LOW (ref >=60)
Glucose, Bld: 87 mg/dL (ref 65–99)
Potassium: 3.9 mmol/L (ref 3.5–5.3)
Sodium: 142 mmol/L (ref 135–146)
Total Bilirubin: 0.8 mg/dL (ref 0.2–1.2)
Total Protein: 7.5 g/dL (ref 6.1–8.1)

## 2016-03-01 LAB — LIPID PANEL
Cholesterol: 161 mg/dL (ref <200)
HDL: 69 mg/dL (ref >40)
LDL Cholesterol: 80 mg/dL (ref <100)
Total CHOL/HDL Ratio: 2.3 Ratio (ref <5.0)
Triglycerides: 62 mg/dL (ref <150)
VLDL: 12 mg/dL (ref <30)

## 2016-03-01 LAB — TSH: TSH: 5.82 mIU/L — ABNORMAL HIGH (ref 0.40–4.50)

## 2016-03-01 NOTE — Patient Instructions (Addendum)
Mr. Coudriet , Thank you for taking time to come for your Medicare Wellness Visit. I appreciate your ongoing commitment to your health goals. Please review the following plan we discussed and let me know if I can assist you in the future.   These are the goals we discussed: Goals    . Weight (lb) < 215 lb (97.5 kg)          Starting 03/01/16, I will attempt to decrease my current weight of 235 lb down to 215 lb.        This is a list of the screening recommended for you and due dates:  Health Maintenance  Topic Date Due  . Complete foot exam   05/20/2015  . Hemoglobin A1C  02/24/2016  . Shingles Vaccine  02/06/2019*  . Urine Protein Check  05/29/2016  . Eye exam for diabetics  12/12/2016  . Tetanus Vaccine  02/05/2021  . Flu Shot  Completed  . Pneumonia vaccines  Completed  *Topic was postponed. The date shown is not the original due date.  Preventive Care for Adults  A healthy lifestyle and preventive care can promote health and wellness. Preventive health guidelines for adults include the following key practices.  . A routine yearly physical is a good way to check with your health care provider about your health and preventive screening. It is a chance to share any concerns and updates on your health and to receive a thorough exam.  . Visit your dentist for a routine exam and preventive care every 6 months. Brush your teeth twice a day and floss once a day. Good oral hygiene prevents tooth decay and gum disease.  . The frequency of eye exams is based on your age, health, family medical history, use  of contact lenses, and other factors. Follow your health care provider's ecommendations for frequency of eye exams.  . Eat a healthy diet. Foods like vegetables, fruits, whole grains, low-fat dairy products, and lean protein foods contain the nutrients you need without too many calories. Decrease your intake of foods high in solid fats, added sugars, and salt. Eat the right amount of  calories for you. Get information about a proper diet from your health care provider, if necessary.  . Regular physical exercise is one of the most important things you can do for your health. Most adults should get at least 150 minutes of moderate-intensity exercise (any activity that increases your heart rate and causes you to sweat) each week. In addition, most adults need muscle-strengthening exercises on 2 or more days a week.  Silver Sneakers may be a benefit available to you. To determine eligibility, you may visit the website: www.silversneakers.com or contact program at (916)289-0996 Mon-Fri between 8AM-8PM.   . Maintain a healthy weight. The body mass index (BMI) is a screening tool to identify possible weight problems. It provides an estimate of body fat based on height and weight. Your health care provider can find your BMI and can help you achieve or maintain a healthy weight.   For adults 20 years and older: ? A BMI below 18.5 is considered underweight. ? A BMI of 18.5 to 24.9 is normal. ? A BMI of 25 to 29.9 is considered overweight. ? A BMI of 30 and above is considered obese.   . Maintain normal blood lipids and cholesterol levels by exercising and minimizing your intake of saturated fat. Eat a balanced diet with plenty of fruit and vegetables. Blood tests for lipids and cholesterol should  begin at age 33 and be repeated every 5 years. If your lipid or cholesterol levels are high, you are over 50, or you are at high risk for heart disease, you may need your cholesterol levels checked more frequently. Ongoing high lipid and cholesterol levels should be treated with medicines if diet and exercise are not working.  . If you smoke, find out from your health care provider how to quit. If you do not use tobacco, please do not start.  . If you choose to drink alcohol, please do not consume more than 2 drinks per day. One drink is considered to be 12 ounces (355 mL) of beer, 5 ounces  (148 mL) of wine, or 1.5 ounces (44 mL) of liquor.  . If you are 9-4 years old, ask your health care provider if you should take aspirin to prevent strokes.  . Use sunscreen. Apply sunscreen liberally and repeatedly throughout the day. You should seek shade when your shadow is shorter than you. Protect yourself by wearing long sleeves, pants, a wide-brimmed hat, and sunglasses year round, whenever you are outdoors.  . Once a month, do a whole body skin exam, using a mirror to look at the skin on your back. Tell your health care provider of new moles, moles that have irregular borders, moles that are larger than a pencil eraser, or moles that have changed in shape or color.

## 2016-03-01 NOTE — Progress Notes (Signed)
1 1  Subjective:   Omar Lambert is a 76 y.o. male who presents for an Initial Medicare Annual Wellness Visit.  Review of Systems  Cardiac Risk Factors include: advanced age (>41men, >25 women);diabetes mellitus;hypertension;male gender;obesity (BMI >30kg/m2);smoking/ tobacco exposure    Objective:    Today's Vitals   03/01/16 0837  BP: 132/68  Pulse: 81  Temp: 98.1 F (36.7 C)  TempSrc: Oral  SpO2: 99%  Weight: 235 lb (106.6 kg)  Height: 5\' 6"  (1.676 m)   Body mass index is 37.93 kg/m.  Current Medications (verified) Outpatient Encounter Prescriptions as of 03/01/2016  Medication Sig  . albuterol (PROVENTIL) (2.5 MG/3ML) 0.083% nebulizer solution INHALE CONTENTS OF 1 VIAL IN NEBULIZER EVERY 4 HOURS AS NEEDED FOR WHEEZING AND ASTHMA  . allopurinol (ZYLOPRIM) 300 MG tablet TAKE 1 TABLET BY MOUTH EVERY DAY  . amiodarone (PACERONE) 200 MG tablet TAKE 1 TABLET TWICE A DAY  . apixaban (ELIQUIS) 5 MG TABS tablet Take 1 tablet (5 mg total) by mouth 2 (two) times daily.  . fluticasone (FLONASE) 50 MCG/ACT nasal spray Place 1 spray into both nostrils daily as needed for allergies.   . fluticasone furoate-vilanterol (BREO ELLIPTA) 100-25 MCG/INH AEPB Inhale 1 puff into the lungs daily.  Marland Kitchen KLOR-CON M20 20 MEQ tablet TAKE 1 TABLET BY MOUTH EVERY DAY  . levothyroxine (SYNTHROID, LEVOTHROID) 150 MCG tablet Take 1 tablet (150 mcg total) by mouth daily before breakfast.  . linagliptin (TRADJENTA) 5 MG TABS tablet Take 1 tablet (5 mg total) by mouth daily.  . montelukast (SINGULAIR) 10 MG tablet Take one tablet by mouth at bedtime as needed for allergies and asthma  . Multiple Vitamins-Minerals (CENTRUM SILVER ADULT 50+ PO) Take 1 tablet by mouth daily.  . pravastatin (PRAVACHOL) 20 MG tablet TAKE 1 TABLET BY MOUTH EVERY DAY WITH SUPPER  . torsemide (DEMADEX) 20 MG tablet TAKE 1 TABLET (20 MG TOTAL) BY MOUTH 2 (TWO) TIMES DAILY. HEART FAILURE  . [DISCONTINUED] ketorolac (ACULAR) 0.4 % SOLN  Place 1 drop into the right eye 4 (four) times daily.   . [DISCONTINUED] ofloxacin (OCUFLOX) 0.3 % ophthalmic solution Place 1 drop into the right eye 4 (four) times daily.   . [DISCONTINUED] prednisoLONE acetate (PRED FORTE) 1 % ophthalmic suspension Place 1 drop into the right eye 4 (four) times daily.    No facility-administered encounter medications on file as of 03/01/2016.     Allergies (verified) Patient has no known allergies.   History: Past Medical History:  Diagnosis Date  . Allergy   . Asthma   . Atrophic gastritis   . Benign essential hypertension   . COPD (chronic obstructive pulmonary disease) ([place])   . Diabetes ([place])   . GERD (gastroesophageal reflux disease)   . Gout attack 09/2012  . Sinusitis, maxillary, chronic   . Type II or unspecified type diabetes mellitus without mention of complication, not stated as uncontrolled    Past Surgical History:  Procedure Laterality Date  . COLONOSCOPY  2003  . HERNIA REPAIR  1980   Family History  Problem Relation Age of Onset  . Stroke Mother   . Cancer Sister   . Diabetes Sister   . COPD Sister   . COPD Sister    Social History   Occupational History  . Retired    Social History Main Topics  . Smoking status: Former Smoker    Packs/day: 1.00    Years: 20.00    Types: Cigarettes    Quit  date: 06/17/1997  . Smokeless tobacco: Never Used  . Alcohol use No  . Drug use: No  . Sexual activity: Not Currently   Tobacco Counseling Counseling given: No   Activities of Daily Living In your present state of health, do you have any difficulty performing the following activities: 03/01/2016 10/28/2015  Hearing? N N  Vision? Y N  Difficulty concentrating or making decisions? N N  Walking or climbing stairs? Y Y  Dressing or bathing? N N  Doing errands, shopping? N N  Preparing Food and eating ? N -  Using the Toilet? N -  In the past six months, have you accidently leaked urine? N -  Do you have problems with  loss of bowel control? N -  Managing your Medications? N -  Managing your Finances? N -  Housekeeping or managing your Housekeeping? N -  Some recent data might be hidden    Immunizations and Health Maintenance Immunization History  Administered Date(s) Administered  . Influenza,inj,Quad PF,36+ Mos 11/22/2014, 10/13/2015  . Pneumococcal Conjugate-13 01/28/2014  . Pneumococcal Polysaccharide-23 02/05/2005  . Td 02/06/2011   Health Maintenance Due  Topic Date Due  . FOOT EXAM  05/20/2015  . HEMOGLOBIN A1C  02/24/2016    Patient Care Team: Gayland Curry, DO as PCP - General (Geriatric Medicine) Troy Sine, MD as Consulting Physician (Cardiology) Jerene Bears, MD as Consulting Physician (Gastroenterology) Tanda Rockers, MD as Consulting Physician (Pulmonary Disease) Clent Jacks, MD as Consulting Physician (Ophthalmology)  Indicate any recent Medical Services you may have received from other than [facility] providers in the past year (date may be approximate).    Assessment:   This is a routine wellness examination for Omar Lambert  Hearing/Vision screen Hearing Screening Comments: Pt denies any hearing concerns.  Vision Screening Comments: Last eye exam done in Nov 2017 with Dr. Katy Fitch.   Dietary issues and exercise activities discussed: Current Exercise Habits: The patient has a physically strenous job, but has no regular exercise apart from work.  Goals    . Weight (lb) < 215 lb (97.5 kg)          Starting 03/01/16, I will attempt to decrease my current weight of 235 lb down to 215 lb.       Depression Screen PHQ 2/9 Scores 03/01/2016 08/29/2015 05/30/2015 05/04/2015  PHQ - 2 Score 0 0 0 1    Fall Risk Fall Risk  03/01/2016 11/03/2015 08/29/2015 07/13/2015 05/30/2015  Falls in the past year? No No No No No  Risk for fall due to : - - - - -    Cognitive Function: MMSE - Mini Mental State Exam 03/01/2016 02/28/2015  Not completed: - (No Data)  Orientation to time 5 5  Orientation  to Place 5 5  Registration 3 3  Attention/ Calculation 4 0  Recall 2 1  Language- name 2 objects 2 2  Language- repeat 1 1  Language- follow 3 step command 3 3  Language- read & follow direction 1 1  Write a sentence 1 0  Copy design 0 1  Total score 27 22        Screening Tests Health Maintenance  Topic Date Due  . FOOT EXAM  05/20/2015  . HEMOGLOBIN A1C  02/24/2016  . ZOSTAVAX  02/06/2019 (Originally 05/28/1999)  . URINE MICROALBUMIN  05/29/2016  . OPHTHALMOLOGY EXAM  12/12/2016  . TETANUS/TDAP  02/05/2021  . INFLUENZA VACCINE  Completed  . PNA vac Low Risk Adult  Completed        Plan:    I have personally reviewed and addressed the Medicare Annual Wellness questionnaire and have noted the following in the patient's chart:  A. Medical and social history B. Use of alcohol, tobacco or illicit drugs  C. Current medications and supplements D. Functional ability and status E.  Nutritional status F.  Physical activity G. Advance directives H. List of other physicians I.  Hospitalizations, surgeries, and ER visits in previous 12 months J.  [place] to include hearing, vision, cognitive, depression L. Referrals and appointments - none  In addition, I have reviewed and discussed with patient certain preventive protocols, quality metrics, and best practice recommendations. A written personalized care plan for preventive services as well as general preventive health recommendations were provided to patient.  See attached scanned questionnaire for additional information.   Signed,   Allyn Kenner, LPN Health Advisor

## 2016-03-01 NOTE — Progress Notes (Signed)
   I reviewed health advisor's note, was available for consultation and agree with the assessment and plan as written.    Adrick Kestler L. Annakate Soulier, D.O. [facility] [address], [place] [address] Cell Phone (Mon-Fri 8am-5pm):  [phone] On Call:  [phone] & follow prompts after 5pm & weekends Office Phone:  [phone] Office Fax:  [phone]   Quick Notes   Health Maintenance:  Pt is up to date on maintenance.    Abnormal Screen: None; MMSE-27/30; passed clock test    Patient Concerns:  None    Nurse Concerns:  None

## 2016-03-01 NOTE — Progress Notes (Signed)
Patient ID: Omar Lambert, male   DOB: 15-Apr-1940, 76 y.o.   MRN: [id number]     HPI:   Omar Lambert is a 76 year old African American male who has a history of severe COPD, diabetes mellitus, chronic kidney disease, hypertension, obstructive sleep apnea and recently diagnosed atrial flutter.  He presents for a 59-monthfollow-up evaluation.    Mr. WGirgishas a history of lower extremity venous insufficiency. A lower extremity venous Doppler study  did not show evidence for thrombus or thrombophlebitis but he had bilateral deep venous insufficiency within the femoral and popliteal veins. No venous insufficiency was noted in the right and left greater saphenous vein or small saphenous vein. A 2-D echo Doppler study on 12/17/2012 demonstrated normal systolic function with an ejection fraction of 55-60% as well as normal diastolic function. He had mild biatrial enlargement and mild dilatation of his right ventricle. There is mild pulmonary hypertension with a PA pressure 39 mm.  He has a history of obstructive sleep apnea.  After not having seen him in over 2 years when I recently saw him he was only using his CPAP at most 30% of the time.  He needed new supplies.  He admitted to experiencing some shortness of breath with activity.  He states he recently had a bad cold.  He continues to note leg swelling.  He is unaware of any rhythm issues.  He has a history of significant obesity.  The last several weeks he has been using a diet supplement Hydroxycut, of which there is significant amount of coffee extract in this preparation.   When I saw him, his ECG suggested atrial flutter with variable block.  At that time, I advised he discontinue his over-the-counter supplement.  With his elevated [id number]  score, I recommend initiation of anticoagulation.  Since he had had renal insufficiency with creatinine in April 2016 at1.72 and creatinine in July at 148,  I initially started him on Xarelto 15 mg for  anticoagulation.  He underwent an echo Doppler study which revealed an EF of 60-65%.  His left atrium was only at the upper limits of normal in size.  However, his right atrium was severely dilated.  There was mild tricuspid regurgitation.  I also initiated metoprolol, initially at 12.5 mg since his resting pulse was already in the 60s.  I also discussed the importance of improved CPAP compliance.  He has been using CPAP with 100% compliance. Blood work in 2016 showed stable hemoglobin and hematocrit.  His current renal function continued to improve and was now 1.36.  Lipid studies revealed a total cholesterol 174, triglycerides 63, HDL 55, and LDL 106.  He had a normal magnesium.  TSH was 3.53.  He states he has not taken Xarelto for at least 10 days when he began to notice some flecks of blood in stool.  He denied any overt GI bleeding.   When I saw him in November 2016.  I discontinued his Xarelto and changed him to Eliquis 5 mg twice a day.  His renal function had improved.  I recommended that he discontinue aspirin.  I entered amiodarone at 2 mg daily in attempt to possibly pharmacologically cardiovert him.  He presents now for follow-up evaluation.  Since I last saw him, he denies any episodes of chest pain.  He continues to use CPAP with 100% compliance.  He has noted some very mild twinges of blood in his nose, which would resolve promptly.  He has experience leg  swelling.  He is unaware of cardiac arrhythmia.  He presents for evaluation   Past Medical History:  Diagnosis Date  . Allergy   . Asthma   . Atrophic gastritis   . Benign essential hypertension   . COPD (chronic obstructive pulmonary disease) ([place])   . Diabetes ([place])   . GERD (gastroesophageal reflux disease)   . Gout attack 09/2012  . Sinusitis, maxillary, chronic   . Type II or unspecified type diabetes mellitus without mention of complication, not stated as uncontrolled     Past Surgical History:  Procedure Laterality Date    . COLONOSCOPY  2003  . HERNIA REPAIR  1980    No Known Allergies  Current Outpatient Prescriptions  Medication Sig Dispense Refill  . albuterol (PROVENTIL) (2.5 MG/3ML) 0.083% nebulizer solution INHALE CONTENTS OF 1 VIAL IN NEBULIZER EVERY 4 HOURS AS NEEDED FOR WHEEZING AND ASTHMA 75 mL 1  . allopurinol (ZYLOPRIM) 300 MG tablet TAKE 1 TABLET BY MOUTH EVERY DAY 30 tablet 6  . amiodarone (PACERONE) 200 MG tablet TAKE 1 TABLET TWICE A DAY 60 tablet 2  . apixaban (ELIQUIS) 5 MG TABS tablet Take 1 tablet (5 mg total) by mouth 2 (two) times daily. 60 tablet 5  . fluticasone (FLONASE) 50 MCG/ACT nasal spray Place 1 spray into both nostrils daily as needed for allergies.     . fluticasone furoate-vilanterol (BREO ELLIPTA) 100-25 MCG/INH AEPB Inhale 1 puff into the lungs daily.    Marland Kitchen KLOR-CON M20 20 MEQ tablet TAKE 1 TABLET BY MOUTH EVERY DAY 30 tablet 1  . levothyroxine (SYNTHROID, LEVOTHROID) 150 MCG tablet Take 1 tablet (150 mcg total) by mouth daily before breakfast. 30 tablet 1  . linagliptin (TRADJENTA) 5 MG TABS tablet Take 1 tablet (5 mg total) by mouth daily. 90 tablet 3  . montelukast (SINGULAIR) 10 MG tablet Take one tablet by mouth at bedtime as needed for allergies and asthma 90 tablet 0  . Multiple Vitamins-Minerals (CENTRUM SILVER ADULT 50+ PO) Take 1 tablet by mouth daily.    . pravastatin (PRAVACHOL) 20 MG tablet TAKE 1 TABLET BY MOUTH EVERY DAY WITH SUPPER 30 tablet 3  . torsemide (DEMADEX) 20 MG tablet TAKE 1 TABLET (20 MG TOTAL) BY MOUTH 2 (TWO) TIMES DAILY. HEART FAILURE 60 tablet 3   No current facility-administered medications for this visit.     Social history is notable in that he is married for 55 years. He has 3 children 8 grandchildren 6 great-grandchildren. He is retired from [employer]. He completed ninth grade education. He smoked until 1999.  Family History  Problem Relation Age of Onset  . Stroke Mother   . Cancer Sister   . Diabetes Sister   . COPD  Sister   . COPD Sister     ROS General: Negative; No fevers, chills, or night sweats; no 6S with weight loss HEENT: Negative; No changes in vision or hearing, sinus congestion, difficulty swallowing Pulmonary: Positive for severe COPD.  Positive for shortness of breath. Cardiovascular: Negative; No chest pain, presyncope, syncope, palpitations Positive for leg swelling GI: Negative; No nausea, vomiting, diarrhea, or abdominal pain GU: Negative; No dysuria, hematuria, or difficulty voiding Musculoskeletal: Negative; no myalgias, joint pain, or weakness Rheumatologic: Positive for gout  Hematologic/Oncology: Negative; no easy bruising, bleeding Endocrine: Positive for diabetes mellitus Neuro: Negative; no changes in balance, headaches Skin: Negative; No rashes or skin lesions Psychiatric: Negative; No behavioral problems, depression Sleep: Positive for obstructive sleep apnea; now with improved  compliance.  No snoring, daytime sleepiness, hypersomnolence, bruxism, restless legs, hypnogognic hallucinations, no cataplexy Other comprehensive 14 point system review is negative.   PE BP 132/81   Pulse 67   Ht _0  (1.676 m)   Wt 234 lb 6.4 oz (106.3 kg)   BMI 37.83 kg/m     Wt Readings from Last 3 Encounters:  03/01/16 235 lb (106.6 kg)  02/28/16 234 lb 6.4 oz (106.3 kg)  11/03/15 240 lb 12.8 oz (109.2 kg)   General: Alert, oriented, no distress.  Skin: normal turgor, no rashes HEENT: Normocephalic, atraumatic. Pupils round and reactive; sclera anicteric; Fundi mild arteriolar narrowing. No hemorrhages or exudates. Nose without nasal septal hypertrophy Mouth/Parynx benign; Mallinpatti scale 4 Neck: Thick neck No JVD, no carotid bruits Lungs: clear to ausculatation and percussion; no wheezing or rales Heart: Mildly irregular rhythm; s1 s2 normal a 1/6 systolic murmur. No S3 or S4 gallop. No rubs, thrills or heaves. Abdomen: Diastases recti; soft, nontender; no  hepatosplenomehaly, BS+; abdominal aorta nontender and not dilated by palpation. Pulses 2+ Extremities: Trace to 1+ tense lower extremity edema. no clubbinbg cyanosis, Homan's sign negative  Neurologic: grossly nonfocal; radial nerves grossly intact  Psychologic: Normal mood  ECG (independently read by me): Normal sinus rhythm at 67 bpm with first-degree AV block.  PR interval 252 ms.  Nonspecific T changes.  March 2017 ECG (independently read by me): Atrial fibrillation at 92 bpm with occasional unifocal PVCs  November 2016 ECG (independently read by me): Atrial fibrillation at 80 bpm with 1 PVC.  11/10/2014 ECG (independently read by me): Atrial flutter with variable block.  Left axis deviation.  Nonspecific T changes.  QTc interval 458 ms.  January 2015 ECG (independently read by me): Sinus rhythm with frequent atrial premature conk contractions as well as occasional unifocal premature ventricular contractions. Nonspecific ST and T wave changes.  Prior ECG of 11/25/2012: Sinus rhythm with first-degree AV block with occasional PVC. The patient is entirely asymptomatic with reference to this ectopy.  LABS: BMP Latest Ref Rng & Units 01/17/2016 12/09/2015 11/03/2015  Glucose 65 - 99 mg/dL 85 84 87  BUN 7 - 25 mg/dL 18 32(H) 30(H)  Creatinine 0.70 - 1.18 mg/dL 1.76(H) 1.89(H) 2.03(H)  BUN/Creat Ratio 10 - 24 - - -  Sodium 135 - 146 mmol/L 142 142 140  Potassium 3.5 - 5.3 mmol/L 4.0 3.4(L) 4.2  Chloride 98 - 110 mmol/L 103 97(L) 98  CO2 20 - 31 mmol/L 31 33(H) 28  Calcium 8.6 - 10.3 mg/dL 9.2 9.3 9.4   Hepatic Function Latest Ref Rng & Units 10/28/2015 08/24/2015 08/18/2015  Total Protein 6.5 - 8.1 g/dL 7.8 7.5 7.9  Albumin 3.5 - 5.0 g/dL 4.2 3.9 4.0  AST 15 - 41 U/L 30 28 49(H)  ALT 17 - 63 U/L _1 Alk Phosphatase 38 - 126 U/L 64 74 76  Total Bilirubin 0.3 - 1.2 mg/dL 0.8 0.8 0.7  Bilirubin, Direct 0.0 - 0.3 mg/dL - - -   CBC Latest Ref Rng & Units 03/01/2016 11/03/2015 10/28/2015   WBC 3.8 - 10.8 K/uL 3.6(L) 5.9 4.9  Hemoglobin 13.2 - 17.1 g/dL 13.0(L) 12.1(L) 11.5(L)  Hematocrit 38.5 - 50.0 % 40.9 37.2(L) 38.0(L)  Platelets 140 - 400 K/uL 212 240 168   Lab Results  Component Value Date   MCV 95.1 03/01/2016   MCV 94.7 11/03/2015   MCV 100.8 (H) 10/28/2015   Lab Results  Component Value Date   TSH 5.82 (  H) 03/01/2016   Lab Results  Component Value Date   HGBA1C 6.3 (H) 08/24/2015   Lipid Panel     Component Value Date/Time   CHOL 163 08/24/2015 0825   CHOL 151 05/25/2015 0950   TRIG 55 08/24/2015 0825   HDL 83 08/24/2015 0825   HDL 68 05/25/2015 0950   CHOLHDL 2.0 08/24/2015 0825   VLDL 11 08/24/2015 0825   LDLCALC 69 08/24/2015 0825   LDLCALC 68 05/25/2015 0950      RADIOLOGY: US Abdomen Complete  11/25/2012   CLINICAL DATA:  Abdominal distention  EXAM: ULTRASOUND ABDOMEN COMPLETE  COMPARISON:  None.  FINDINGS: Gallbladder  No gallstones or wall thickening visualized. No sonographic Murphy sign noted.  Common bile duct  Diameter: 4.2 mm  Liver  No focal lesion identified. Within normal limits in parenchymal echogenicity.  IVC  No abnormality visualized.  Pancreas  Visualized portion unremarkable.  Spleen  Size and appearance within normal limits.  Right Kidney  Length: 9.8 cm in length. A 1.8 cm cyst is noted in the parapelvic region.  Left Kidney  Length: 10.7 cm in length. Echogenicity within normal limits. No mass or hydronephrosis visualized.  Abdominal aorta  No aneurysm visualized.  IMPRESSION: Right renal cyst. No other focal abnormality is noted.   Electronically Signed   By: Inez Catalina M.D.   On: 11/25/2012 08:12   IMPRESSION:     1. Essential hypertension, benign   2. Controlled type 2 diabetes mellitus with diabetic nephropathy, without long-term current use of insulin ([place])   3. Hyperlipidemia associated with type 2 diabetes mellitus ([place])   4. PAF (paroxysmal atrial fibrillation) ([facility])   5. Obesity (BMI 30-39.9)   6. OSA on CPAP    7. Venous insufficiency of both lower extremities   8. Bilateral lower extremity edema   9. Hypothyroidism, unspecified type   10. Hyperlipidemia with target LDL less than 70     ASSESSMENT AND PLAN: Omar Lambert is a 76 year old African American male with a long history of prior tobacco use but quit approximately 18 years ago. He has COPD. A prior echo Doppler study demonstrated normal systolic and diastolic function with mild biatrial enlargement as well as mild dilatation of his right ventricle and mild pulmonary hypertension with an estimated PA pressure 39 mm.  His most recent echo shows ejection fraction at 60-65% with moderate left ventricular hypertrophy,  trivial MR, upper normal LA size but severely dilated, RA with mild tricuspid regurgitation.  There is mild pulmonary hypertension with a PA pressure at 38 mm.  When I saw him in November 2016  after not having seen him in over 2 years, he was in atrial flutter with variable block.  He was using an over-the-counter supplement which had a significant amount of caffeine.  When I saw him one month later, he was still in atrial fibrillation.  He is now tolerating Eliquis well and is on 5 mg twice a day dosing.  He was admitted to the hospital with acute hypoxic and hypercarbic respiratory failure in the setting of his underlying COPD as well as pulmonary edema due to acute on chronic diastolic heart failure in July 2017.  He had previously undergone cardioversion and was maintaining sinus rhythm.  Echo Doppler study showed an EF of 60-65% with moderate LA and RA dilatation.  There was moderate TR.  PA pressure was 40 mm.  His ECG today confirms that he is maintaining sinus rhythm with first-degree AV block.  On amiodarone 200 mg twice a day.  He continues to be on eloquence 5 mg twice a day for anticoagulation.  He has been taking torsemide 20 mg twice a day since he had not taken today's dose.  I have suggested that when he goes home he takes 40  mg at once to see if this can improve some of his diuresis.  He has hypothyroidism and continues to be on levothyroxine 150 g.  He has been on pravastatin for hyperlipidemia with target LDL less than 60.  He is diabetic and currently is on Tradjenta.  He may ultimately be a candidate for SGLT2 inhibitor therapy such as Wilder Glade particularly with improved cardiovascular outcome data.  Laboratory will be obtained consisting of chemistry profile, magnesium, TSH, hemoglobin, A1c, CBC, and lipid studies.  Adjustments to his medications will be made accordingly.  I will see him in 2-3 months for reevaluation.  Time spent: 25 minutes  Troy Sine, MD, [facility] 03/01/2016 6:33 PM

## 2016-03-02 ENCOUNTER — Encounter: Payer: Self-pay | Admitting: Internal Medicine

## 2016-03-02 ENCOUNTER — Ambulatory Visit (INDEPENDENT_AMBULATORY_CARE_PROVIDER_SITE_OTHER): Payer: Medicare HMO | Admitting: Internal Medicine

## 2016-03-02 ENCOUNTER — Ambulatory Visit: Payer: Commercial Managed Care - HMO

## 2016-03-02 VITALS — BP 148/80 | HR 60 | Temp 98.3°F | Ht 66.0 in | Wt 233.0 lb

## 2016-03-02 DIAGNOSIS — Z0000 Encounter for general adult medical examination without abnormal findings: Secondary | ICD-10-CM | POA: Diagnosis not present

## 2016-03-02 LAB — HEMOGLOBIN A1C
Hgb A1c MFr Bld: 5.8 % — ABNORMAL HIGH (ref <5.7)
Mean Plasma Glucose: 120 mg/dL

## 2016-03-02 MED ORDER — LEVOTHYROXINE SODIUM 175 MCG PO TABS: 175.0000 ug | ORAL_TABLET | Freq: Every day | ORAL | 3 refills | Status: DC

## 2016-03-02 NOTE — Progress Notes (Signed)
Provider:  Rexene Edison. Mariea Clonts, D.O., C.M.D. Location:   [facility]   Place of Service:   clinic  Previous PCP: [name], DO Patient Care Team: Gayland Curry, DO as PCP - General (Geriatric Medicine) Troy Sine, MD as Consulting Physician (Cardiology) Jerene Bears, MD as Consulting Physician (Gastroenterology) Tanda Rockers, MD as Consulting Physician (Pulmonary Disease) Clent Jacks, MD as Consulting Physician (Ophthalmology)  Extended Emergency Contact Information Primary Emergency Contact: Mcquigg,[name] Address: [address] [place], [place] [address] [place] Phone: [phone] Mobile Phone: [phone] Relation: Spouse Secondary Emergency Contact: Pickard,Phyliss  [place] Phone: [phone] Relation: Daughter  Code Status:  Goals of Care: Advanced Directive information Advanced Directives 03/01/2016  Does Patient Have a Medical Advance Directive? No  Copy of Healthcare Power of Attorney in Chart? -  Would patient like information on creating a medical advance directive? No - Patient declined   Chief Complaint  Patient presents with  . Annual Exam    CPE    HPI: Patient is a 76 y.o. male seen today for an annual physical exam.  Has not taken his meds yet this am.   Working on losing weight and lost 10 lbs since September.  Is taking his meds and wearing his compression hose as directed.   Wearing his cpap.    Past Medical History:  Diagnosis Date  . Allergy   . Asthma   . Atrophic gastritis   . Benign essential hypertension   . COPD (chronic obstructive pulmonary disease) ([facility])   . Diabetes ([facility])   . GERD (gastroesophageal reflux disease)   . Gout attack 09/2012  . Sinusitis, maxillary, chronic   . Type II or unspecified type diabetes mellitus without mention of complication, not stated as uncontrolled    Past Surgical History:  Procedure Laterality Date  . COLONOSCOPY  2003  . [place]    reports that he quit smoking about 18 years ago. His smoking use included Cigarettes. He has a 20.00 pack-year smoking history. He has never used smokeless tobacco. He reports that he does not drink alcohol or use drugs.  Functional Status Survey:    Family History  Problem Relation Age of Onset  . Stroke Mother   . Cancer Sister   . Diabetes Sister   . COPD Sister   . COPD Sister     Health Maintenance  Topic Date Due  . FOOT EXAM  05/20/2015  . ZOSTAVAX  02/06/2019 (Originally 05/28/1999)  . URINE MICROALBUMIN  05/29/2016  . HEMOGLOBIN A1C  08/29/2016  . OPHTHALMOLOGY EXAM  12/12/2016  . TETANUS/TDAP  02/05/2021  . INFLUENZA VACCINE  Completed  . PNA vac Low Risk Adult  Completed    No Known Allergies  Allergies as of 03/02/2016   No Known Allergies     Medication List       Accurate as of 03/02/16  9:00 AM. Always use your most recent med list.          albuterol (2.5 MG/3ML) 0.083% nebulizer solution Commonly known as:  PROVENTIL INHALE CONTENTS OF 1 VIAL IN NEBULIZER EVERY 4 HOURS AS NEEDED FOR WHEEZING AND ASTHMA   allopurinol 300 MG tablet Commonly known as:  ZYLOPRIM TAKE 1 TABLET BY MOUTH EVERY DAY   amiodarone 200 MG tablet Commonly known as:  PACERONE TAKE 1 TABLET TWICE A DAY   apixaban 5 MG Tabs  tablet Commonly known as:  ELIQUIS Take 1 tablet (5 mg total) by mouth 2 (two) times daily.   CENTRUM SILVER ADULT 50+ PO Take 1 tablet by mouth daily.   fluticasone 50 MCG/ACT nasal spray Commonly known as:  FLONASE Place 1 spray into both nostrils daily as needed for allergies.   fluticasone furoate-vilanterol 100-25 MCG/INH Aepb Commonly known as:  BREO ELLIPTA Inhale 1 puff into the lungs daily.   KLOR-CON M20 20 MEQ tablet Generic drug:  potassium chloride SA TAKE 1 TABLET BY MOUTH EVERY DAY   levothyroxine 150 MCG tablet Commonly known as:  SYNTHROID, LEVOTHROID Take 1 tablet (150 mcg total) by mouth daily before  breakfast.   linagliptin 5 MG Tabs tablet Commonly known as:  TRADJENTA Take 1 tablet (5 mg total) by mouth daily.   montelukast 10 MG tablet Commonly known as:  SINGULAIR Take one tablet by mouth at bedtime as needed for allergies and asthma   pravastatin 20 MG tablet Commonly known as:  PRAVACHOL TAKE 1 TABLET BY MOUTH EVERY DAY WITH SUPPER   torsemide 20 MG tablet Commonly known as:  DEMADEX TAKE 1 TABLET (20 MG TOTAL) BY MOUTH 2 (TWO) TIMES DAILY. HEART FAILURE       Review of Systems  Constitutional: Negative for chills and fever.  HENT: Negative for hearing loss.   Eyes: Negative for blurred vision.  Respiratory: Positive for cough and sputum production. Negative for shortness of breath.        Recovering from URI  Cardiovascular: Positive for leg swelling. Negative for chest pain and palpitations.       Controlled with compression hose and diuretics  Gastrointestinal: Negative for abdominal pain, blood in stool, constipation and melena.  Genitourinary: Positive for frequency. Negative for dysuria and urgency.  Musculoskeletal: Negative for falls and myalgias.  Skin: Negative for itching and rash.  Neurological: Negative for dizziness, loss of consciousness and weakness.  Endo/Heme/Allergies: Bruises/bleeds easily.  Psychiatric/Behavioral: Positive for memory loss. Negative for depression.    Vitals:   03/02/16 0848  BP: (!) 148/80  Pulse: 60  Temp: 98.3 F (36.8 C)  TempSrc: Oral  SpO2: 94%  Weight: 233 lb (105.7 kg)  Height: 5\' 6"  (1.676 m)   Body mass index is 37.61 kg/m. Physical Exam  Constitutional: He is oriented to person, place, and time. He appears well-developed and well-nourished. No distress.  HENT:  Head: Normocephalic and atraumatic.  Right Ear: External ear normal.  Left Ear: External ear normal.  Nose: Nose normal.  Mouth/Throat: Oropharynx is clear and moist. No oropharyngeal exudate.  Eyes: Conjunctivae and EOM are normal. Pupils are  equal, round, and reactive to light.  Neck: Normal range of motion. Neck supple. No JVD present.  Cardiovascular:  irreg irreg  Pulmonary/Chest: Effort normal and breath sounds normal. No respiratory distress.  Abdominal: Soft. Bowel sounds are normal. He exhibits no distension and no mass. There is no tenderness. There is no rebound and no guarding. No hernia.  Musculoskeletal: Normal range of motion. He exhibits no edema or tenderness.  Lymphadenopathy:    He has no cervical adenopathy.  Neurological: He is alert and oriented to person, place, and time. No cranial nerve deficit.  Skin: Skin is warm and dry.  Psychiatric: He has a normal mood and affect. His behavior is normal.    Labs reviewed: Basic Metabolic Panel:  Recent Labs  12/09/15 0807 01/17/16 1146 03/01/16 0819  NA 142 142 142  K 3.4* 4.0 3.9  CL  97* 103 98  CO2 33* 31 33*  GLUCOSE 84 85 87  BUN 32* 18 32*  CREATININE 1.89* 1.76* 1.97*  CALCIUM 9.3 9.2 9.3   Liver Function Tests:  Recent Labs  08/24/15 0825 10/28/15 0938 03/01/16 0819  AST 28 30 40*  ALT 22 22 25   ALKPHOS 74 64 113  BILITOT 0.8 0.8 0.8  PROT 7.5 7.8 7.5  ALBUMIN 3.9 4.2 4.2   No results for input(s): LIPASE, AMYLASE in the last 8760 hours. No results for input(s): AMMONIA in the last 8760 hours. CBC:  Recent Labs  10/28/15 0938 11/03/15 1006 03/01/16 0819  WBC 4.9 5.9 3.6*  NEUTROABS 3.7 4,130 2,160  HGB 11.5* 12.1* 13.0*  HCT 38.0* 37.2* 40.9  MCV 100.8* 94.7 95.1  PLT 168 240 212   Cardiac Enzymes:  Recent Labs  10/28/15 0938  TROPONINI 0.04*   BNP: Invalid input(s): POCBNP Lab Results  Component Value Date   HGBA1C 5.8 (H) 03/01/2016   Lab Results  Component Value Date   TSH 5.82 (H) 03/01/2016   Lab Results  Component Value Date   VITAMINB12 407 11/03/2015   Lab Results  Component Value Date   FOLATE 19.7 11/18/2012   Lab Results  Component Value Date   IRON 59 11/18/2012   FERRITIN 62.5  11/18/2012     Assessment/Plan 1. Annual physical exam - is up to date on preventive care, foot exam was done today, cannot take ace/arb due to advanced ckd - TSH; Future - Hemoglobin A1c; Future - Basic metabolic panel; Future   Labs/tests ordered:  Orders Placed This Encounter  Procedures  . TSH    Standing Status:   Future    Standing Expiration Date:   08/30/2016  . Hemoglobin A1c    Standing Status:   Future    Standing Expiration Date:   08/30/2016  . Basic metabolic panel    Standing Status:   Future    Standing Expiration Date:   08/30/2016    Kasey Hansell L. Tyion Boylen, D.O. [facility] [address], [place] [address] Cell Phone (Mon-Fri 8am-5pm):  [phone] On Call:  [phone] & follow prompts after 5pm & weekends Office Phone:  [phone] Office Fax:  [phone]

## 2016-03-13 ENCOUNTER — Encounter: Payer: Self-pay | Admitting: Nurse Practitioner

## 2016-03-13 ENCOUNTER — Ambulatory Visit (INDEPENDENT_AMBULATORY_CARE_PROVIDER_SITE_OTHER): Payer: Medicare HMO | Admitting: Nurse Practitioner

## 2016-03-13 VITALS — BP 162/58 | HR 70 | Temp 97.5°F | Resp 16 | Ht 66.0 in | Wt 241.2 lb

## 2016-03-13 DIAGNOSIS — I83013 Varicose veins of right lower extremity with ulcer of ankle: Secondary | ICD-10-CM | POA: Diagnosis not present

## 2016-03-13 DIAGNOSIS — L97319 Non-pressure chronic ulcer of right ankle with unspecified severity: Principal | ICD-10-CM

## 2016-03-13 NOTE — Patient Instructions (Addendum)
Xeroform gauze to wound then cover with dressing.  Change xeroform 3 times weekly Follow up in 2 weeks.  If wound gets worse--- larger, drainage, heat, pain occurs follow up sooner.   [place] supply for Xeroform Gauze [address] [place], [place] [address]   Venous Ulcer Introduction A venous ulcer is a shallow sore on your lower leg. It is caused by poor circulation in your veins. Venous ulcer is the most common type of lower leg ulcer. You may have venous ulcers on one leg or on both legs. This condition most often develops around your ankles. This type of ulcer may last for a long time (chronic ulcer) or it may return often (recurrent ulcer). Follow these instructions at home: Wound care  Follow instructions from your doctor about:  How to take care of your wound.  When and how you should change your bandage (dressing).  When you should remove your bandage. If your bandage is dry and gets stuck to your leg when you try to remove it, moisten or wet the bandage with saline solution or water. This helps you to remove it without harming your skin or wound.  Check your wound every day for signs of infection. Have a caregiver do this for you if you are not able to do it yourself. Watch for:  More redness, swelling, or pain.  More fluid or blood.  Pus, warmth, or a bad smell. Medicines  Take over-the-counter and prescription medicines only as told by your doctor.  If you were prescribed an antibiotic medicine, take it or apply it as told by your doctor. Do not stop taking or using the antibiotic even if your condition improves. Activity  Do not stand or sit in one position for a long period of time. Rest with your legs raised during the day. If possible, keep your legs above your heart for 30 minutes, 3-4 times a day, or as told by your doctor.  Do not sit with your legs crossed.  Walk often to increase the blood flow in your legs. Ask your doctor what level of activity  is safe for you.  If you are taking a long ride in a car or plane, take a break to walk around at least once every two hours, or as told by your doctor. Ask your doctor if you should take aspirin before long trips. General instructions  Wear elastic stockings, compression stockings, or support hose as told by your doctor. This is very important.  Raise the foot of your bed as told by your doctor.  Do not smoke.  Keep all follow-up visits as told by your doctor. This is important. Contact a doctor if:  You have a fever.  Your ulcer is getting larger or is not healing.  Your pain gets worse.  You have more redness or swelling around your ulcer.  You have more fluid, blood, or pus coming from your ulcer after it has been cleaned by you or your doctor.  You have warmth or a bad smell coming from your ulcer. This information is not intended to replace advice given to you by your health care provider. Make sure you discuss any questions you have with your health care provider. Document Released: 03/01/2004 Document Revised: 06/30/2015 Document Reviewed: 06/02/2014  2017 Elsevier

## 2016-03-13 NOTE — Progress Notes (Signed)
Careteam: Patient Care Team: Gayland Curry, DO as PCP - General (Geriatric Medicine) Troy Sine, MD as Consulting Physician (Cardiology) Jerene Bears, MD as Consulting Physician (Gastroenterology) Tanda Rockers, MD as Consulting Physician (Pulmonary Disease) Clent Jacks, MD as Consulting Physician (Ophthalmology)  Advanced Directive information Does Patient Have a Medical Advance Directive?: No  No Known Allergies  Chief Complaint  Patient presents with  . Acute Visit    Sore on right ankle x 3 - 4 weeks.      HPI: Patient is a 76 y.o. male seen in the office today due to chronic ankle wound on right. Pt reports it will heal up and then he will hit leg and open back up. Feels like area is getting worse. Has been using neosporin and soaking in salt. Sore for 3-4 weeks. Thought it was time to be seen.  Pt with hx of DM, venous insufficiency, CKD, HTN Wound opened up when his leg was swollen, started wearing compression hose and swelling has gotten much better.  No pain at this time but pain when he soaked in salt.  Review of Systems:  Review of Systems  Constitutional: Negative for activity change, appetite change and fatigue.  Skin: Positive for wound. Negative for color change and rash.   Past Medical History:  Diagnosis Date  . Allergy   . Asthma   . Atrophic gastritis   . Benign essential hypertension   . COPD (chronic obstructive pulmonary disease) ([place])   . Diabetes ([place])   . GERD (gastroesophageal reflux disease)   . Gout attack 09/2012  . Sinusitis, maxillary, chronic   . Type II or unspecified type diabetes mellitus without mention of complication, not stated as uncontrolled    Past Surgical History:  Procedure Laterality Date  . COLONOSCOPY  2003  . HERNIA REPAIR  1980   Social History:   reports that he quit smoking about 18 years ago. His smoking use included Cigarettes. He has a 20.00 pack-year smoking history. He has never used smokeless  tobacco. He reports that he does not drink alcohol or use drugs.  Family History  Problem Relation Age of Onset  . Stroke Mother   . Cancer Sister   . Diabetes Sister   . COPD Sister   . COPD Sister     Medications: Patient's Medications  New Prescriptions   No medications on file  Previous Medications   ALBUTEROL (PROVENTIL) (2.5 MG/3ML) 0.083% NEBULIZER SOLUTION    INHALE CONTENTS OF 1 VIAL IN NEBULIZER EVERY 4 HOURS AS NEEDED FOR WHEEZING AND ASTHMA   ALLOPURINOL (ZYLOPRIM) 300 MG TABLET    TAKE 1 TABLET BY MOUTH EVERY DAY   AMIODARONE (PACERONE) 200 MG TABLET    TAKE 1 TABLET TWICE A DAY   APIXABAN (ELIQUIS) 5 MG TABS TABLET    Take 1 tablet (5 mg total) by mouth 2 (two) times daily.   FLUTICASONE (FLONASE) 50 MCG/ACT NASAL SPRAY    Place 1 spray into both nostrils daily as needed for allergies.    FLUTICASONE FUROATE-VILANTEROL (BREO ELLIPTA) 100-25 MCG/INH AEPB    Inhale 1 puff into the lungs daily.   KLOR-CON M20 20 MEQ TABLET    TAKE 1 TABLET BY MOUTH EVERY DAY   LEVOTHYROXINE (SYNTHROID, LEVOTHROID) 175 MCG TABLET    Take 1 tablet (175 mcg total) by mouth daily before breakfast.   LINAGLIPTIN (TRADJENTA) 5 MG TABS TABLET    Take 1 tablet (5 mg total) by mouth  daily.   MONTELUKAST (SINGULAIR) 10 MG TABLET    Take one tablet by mouth at bedtime as needed for allergies and asthma   MULTIPLE VITAMINS-MINERALS (CENTRUM SILVER ADULT 50+ PO)    Take 1 tablet by mouth daily.   PRAVASTATIN (PRAVACHOL) 20 MG TABLET    TAKE 1 TABLET BY MOUTH EVERY DAY WITH SUPPER   TORSEMIDE (DEMADEX) 20 MG TABLET    TAKE 1 TABLET (20 MG TOTAL) BY MOUTH 2 (TWO) TIMES DAILY. HEART FAILURE  Modified Medications   No medications on file  Discontinued Medications   No medications on file     Physical Exam:  Vitals:   03/13/16 1042  BP: (!) 162/58  Pulse: 70  Resp: 16  Temp: 97.5 F (36.4 C)  TempSrc: Oral  SpO2: 94%  Weight: 241 lb 3.2 oz (109.4 kg)  Height: 5\' 6"  (1.676 m)   Body mass  index is 38.93 kg/m.  Physical Exam  Constitutional: He appears well-developed and well-nourished.  Musculoskeletal: He exhibits no edema or tenderness.  Skin: Skin is warm and dry. No erythema.  0.5 cm x1.5 cm wound with irregular boarders , pink wound bed, no heat or drainage.    Labs reviewed: Basic Metabolic Panel:  Recent Labs  12/09/15 0807 01/17/16 1146 03/01/16 0819  NA 142 142 142  K 3.4* 4.0 3.9  CL 97* 103 98  CO2 33* 31 33*  GLUCOSE 84 85 87  BUN 32* 18 32*  CREATININE 1.89* 1.76* 1.97*  CALCIUM 9.3 9.2 9.3  TSH 7.63* 4.92* 5.82*   Liver Function Tests:  Recent Labs  08/24/15 0825 10/28/15 0938 03/01/16 0819  AST 28 30 40*  ALT 22 22 25   ALKPHOS 74 64 113  BILITOT 0.8 0.8 0.8  PROT 7.5 7.8 7.5  ALBUMIN 3.9 4.2 4.2   No results for input(s): LIPASE, AMYLASE in the last 8760 hours. No results for input(s): AMMONIA in the last 8760 hours. CBC:  Recent Labs  10/28/15 0938 11/03/15 1006 03/01/16 0819  WBC 4.9 5.9 3.6*  NEUTROABS 3.7 4,130 2,160  HGB 11.5* 12.1* 13.0*  HCT 38.0* 37.2* 40.9  MCV 100.8* 94.7 95.1  PLT 168 240 212   Lipid Panel:  Recent Labs  05/25/15 0950 08/24/15 0825 03/01/16 0819  CHOL 151 163 161  HDL 68 83 69  LDLCALC 68 69 80  TRIG 76 55 62  CHOLHDL 2.2 2.0 2.3   TSH:  Recent Labs  12/09/15 0807 01/17/16 1146 03/01/16 0819  TSH 7.63* 4.92* 5.82*   A1C: Lab Results  Component Value Date   HGBA1C 5.8 (H) 03/01/2016     Assessment/Plan 1. Venous ulcer of ankle, right ([facility]) No signs of infection, swelling has improved.  To cont to wear compression hose.  Xeroform gauze to wound then cover with dressing.  Change xeroform 3 times weekly Follow up in 2 weeks.  If wound gets worse--- larger, drainage, heat, pain occurs follow up sooner.    [name]. Harle Battiest  [facility] [phone] 8 am - 5 pm) [phone] (after hours)

## 2016-03-20 ENCOUNTER — Other Ambulatory Visit: Payer: Self-pay | Admitting: Internal Medicine

## 2016-03-22 ENCOUNTER — Telehealth: Payer: Self-pay | Admitting: Cardiovascular Disease

## 2016-03-22 NOTE — Telephone Encounter (Signed)
Returned call to patient and advised him we do not keep samples of his diabetic medications in our office and advised he contact his PCP. He voiced understanding.

## 2016-03-22 NOTE — Telephone Encounter (Signed)
New message   Patient calling the office for samples of medication:   1.  What medication and dosage are you requesting samples for? Diabetic medication  2.  Are you currently out of this medication? yes

## 2016-03-28 DIAGNOSIS — E1121 Type 2 diabetes mellitus with diabetic nephropathy: Secondary | ICD-10-CM | POA: Diagnosis not present

## 2016-03-28 DIAGNOSIS — I481 Persistent atrial fibrillation: Secondary | ICD-10-CM | POA: Diagnosis not present

## 2016-03-28 DIAGNOSIS — E785 Hyperlipidemia, unspecified: Secondary | ICD-10-CM | POA: Diagnosis not present

## 2016-03-28 DIAGNOSIS — I10 Essential (primary) hypertension: Secondary | ICD-10-CM | POA: Diagnosis not present

## 2016-03-28 DIAGNOSIS — E1169 Type 2 diabetes mellitus with other specified complication: Secondary | ICD-10-CM | POA: Diagnosis not present

## 2016-03-28 LAB — COMPREHENSIVE METABOLIC PANEL
ALBUMIN: 4.1 g/dL (ref 3.6–5.1)
ALT: 24 U/L (ref 9–46)
AST: 34 U/L (ref 10–35)
Alkaline Phosphatase: 123 U/L — ABNORMAL HIGH (ref 40–115)
BILIRUBIN TOTAL: 0.9 mg/dL (ref 0.2–1.2)
BUN: 30 mg/dL — ABNORMAL HIGH (ref 7–25)
CO2: 35 mmol/L — ABNORMAL HIGH (ref 20–31)
CREATININE: 2.19 mg/dL — AB (ref 0.70–1.18)
Calcium: 8.6 mg/dL (ref 8.6–10.3)
Chloride: 97 mmol/L — ABNORMAL LOW (ref 98–110)
Glucose, Bld: 78 mg/dL (ref 65–99)
Potassium: 4.4 mmol/L (ref 3.5–5.3)
SODIUM: 141 mmol/L (ref 135–146)
TOTAL PROTEIN: 7.4 g/dL (ref 6.1–8.1)

## 2016-03-28 LAB — LIPID PANEL
CHOL/HDL RATIO: 1.9 ratio (ref <5.0)
CHOLESTEROL: 162 mg/dL (ref <200)
HDL: 84 mg/dL (ref >40)
LDL Cholesterol: 70 mg/dL (ref <100)
Triglycerides: 41 mg/dL (ref <150)
VLDL: 8 mg/dL (ref <30)

## 2016-03-28 LAB — CBC
HCT: 38.8 % (ref 38.5–50.0)
HEMOGLOBIN: 12.2 g/dL — AB (ref 13.2–17.1)
MCH: 30 pg (ref 27.0–33.0)
MCHC: 31.4 g/dL — ABNORMAL LOW (ref 32.0–36.0)
MCV: 95.6 fL (ref 80.0–100.0)
MPV: 10.8 fL (ref 7.5–12.5)
Platelets: 164 10*3/uL (ref 140–400)
RBC: 4.06 MIL/uL — AB (ref 4.20–5.80)
RDW: 15.9 % — ABNORMAL HIGH (ref 11.0–15.0)
WBC: 3.5 10*3/uL — AB (ref 3.8–10.8)

## 2016-03-28 LAB — TSH: TSH: 5.71 m[IU]/L — AB (ref 0.40–4.50)

## 2016-03-28 LAB — HEMOGLOBIN A1C
Hgb A1c MFr Bld: 5.9 % — ABNORMAL HIGH (ref <5.7)
Mean Plasma Glucose: 123 mg/dL

## 2016-03-29 ENCOUNTER — Encounter: Payer: Self-pay | Admitting: Internal Medicine

## 2016-03-29 ENCOUNTER — Ambulatory Visit (INDEPENDENT_AMBULATORY_CARE_PROVIDER_SITE_OTHER): Payer: Medicare HMO | Admitting: Internal Medicine

## 2016-03-29 VITALS — BP 138/80 | HR 67 | Wt 241.0 lb

## 2016-03-29 DIAGNOSIS — L97319 Non-pressure chronic ulcer of right ankle with unspecified severity: Secondary | ICD-10-CM | POA: Insufficient documentation

## 2016-03-29 DIAGNOSIS — E039 Hypothyroidism, unspecified: Secondary | ICD-10-CM | POA: Diagnosis not present

## 2016-03-29 DIAGNOSIS — I83013 Varicose veins of right lower extremity with ulcer of ankle: Secondary | ICD-10-CM | POA: Diagnosis not present

## 2016-03-29 MED ORDER — SILVER SULFADIAZINE 1 % EX CREA: 1.0000 "application " | TOPICAL_CREAM | Freq: Every day | CUTANEOUS | 3 refills | Status: DC

## 2016-03-29 NOTE — Progress Notes (Signed)
Location:  [facility] clinic Provider: Ugochukwu Chichester L. Mariea Clonts, D.O., C.M.D.  Code Status: full code Goals of Care:  Advanced Directives 03/13/2016  Does Patient Have a Medical Advance Directive? No  Copy of Healthcare Power of Attorney in Chart? -  Would patient like information on creating a medical advance directive? -   Chief Complaint  Patient presents with  . Acute Visit    sore on ankle    HPI: Patient is a 76 y.o. male with h/o DMII, chronic venous insufficiency on compression hose, CKD and HTN seen today for an acute visit for sore on his ankle that is not healing well.  He was seen 03/13/16 by Janett Billow NP.  At that time he reported that it was coming and going, but had just gotten worse.  He was soaking it in epsom salts for 3-4 wks.  Janett Billow advised xerofoam gauze to the wound and then cover with dressing (change 3x weekly) and f/u in two weeks.    He has called cardiology for samples of his diabetes meds.  He now needs eliquis and tradjenta he reports to [place].  Samples provided.  Past Medical History:  Diagnosis Date  . Allergy   . Asthma   . Atrophic gastritis   . Benign essential hypertension   . COPD (chronic obstructive pulmonary disease) ([place])   . Diabetes ([place])   . GERD (gastroesophageal reflux disease)   . Gout attack 09/2012  . Sinusitis, maxillary, chronic   . Type II or unspecified type diabetes mellitus without mention of complication, not stated as uncontrolled     Past Surgical History:  Procedure Laterality Date  . COLONOSCOPY  2003  . HERNIA REPAIR  1980    No Known Allergies  Allergies as of 03/29/2016   No Known Allergies     Medication List       Accurate as of 03/29/16  2:34 PM. Always use your most recent med list.          albuterol (2.5 MG/3ML) 0.083% nebulizer solution Commonly known as:  PROVENTIL INHALE CONTENTS OF 1 VIAL IN NEBULIZER EVERY 4 HOURS AS NEEDED FOR WHEEZING AND ASTHMA   allopurinol 300 MG tablet Commonly known as:   ZYLOPRIM TAKE 1 TABLET BY MOUTH EVERY DAY   amiodarone 200 MG tablet Commonly known as:  PACERONE TAKE 1 TABLET TWICE A DAY   apixaban 5 MG Tabs tablet Commonly known as:  ELIQUIS Take 1 tablet (5 mg total) by mouth 2 (two) times daily.   CENTRUM SILVER ADULT 50+ PO Take 1 tablet by mouth daily.   fluticasone 50 MCG/ACT nasal spray Commonly known as:  FLONASE Place 1 spray into both nostrils daily as needed for allergies.   fluticasone furoate-vilanterol 100-25 MCG/INH Aepb Commonly known as:  BREO ELLIPTA Inhale 1 puff into the lungs daily.   KLOR-CON M20 20 MEQ tablet Generic drug:  potassium chloride SA TAKE 1 TABLET BY MOUTH EVERY DAY   levothyroxine 175 MCG tablet Commonly known as:  SYNTHROID, LEVOTHROID Take 1 tablet (175 mcg total) by mouth daily before breakfast.   linagliptin 5 MG Tabs tablet Commonly known as:  TRADJENTA Take 1 tablet (5 mg total) by mouth daily.   montelukast 10 MG tablet Commonly known as:  SINGULAIR Take one tablet by mouth at bedtime as needed for allergies and asthma   pravastatin 20 MG tablet Commonly known as:  PRAVACHOL TAKE 1 TABLET BY MOUTH EVERY DAY WITH SUPPER   torsemide 20 MG tablet Commonly  known as:  DEMADEX TAKE 1 TABLET (20 MG TOTAL) BY MOUTH 2 (TWO) TIMES DAILY. HEART FAILURE       Review of Systems:  Review of Systems  Constitutional: Negative for chills, fever and malaise/fatigue.  HENT: Positive for hearing loss.   Eyes: Negative for blurred vision.  Respiratory: Negative for cough and shortness of breath.   Cardiovascular: Positive for leg swelling. Negative for chest pain and palpitations.  Gastrointestinal: Negative for abdominal pain, blood in stool, constipation and melena.  Genitourinary: Negative for dysuria.  Musculoskeletal: Negative for falls and joint pain.  Skin:       Venous ulcer right medial ankle  Neurological: Negative for dizziness.  Endo/Heme/Allergies: Does not bruise/bleed easily.   Psychiatric/Behavioral: Positive for memory loss. Negative for depression. The patient does not have insomnia.     Health Maintenance  Topic Date Due  . URINE MICROALBUMIN  05/29/2016  . HEMOGLOBIN A1C  09/25/2016  . OPHTHALMOLOGY EXAM  12/12/2016  . FOOT EXAM  03/02/2017  . TETANUS/TDAP  02/05/2021  . INFLUENZA VACCINE  Completed  . PNA vac Low Risk Adult  Completed    Physical Exam: Vitals:   03/29/16 1414  BP: 138/80  Pulse: 67  SpO2: 96%  Weight: 241 lb (109.3 kg)   Body mass index is 38.9 kg/m. Physical Exam  Constitutional: He is oriented to person, place, and time. He appears well-developed and well-nourished. No distress.  Cardiovascular: Normal rate, regular rhythm and intact distal pulses.   Murmur heard. Nonpitting edema bilateral ankles (hose off due to visit for wound)  Pulmonary/Chest: Effort normal and breath sounds normal. No respiratory distress. He has no rales.  Neurological: He is alert and oriented to person, place, and time.  Skin:  Right ankle with about 1cm area of broken skin with pale pink tissue visible, nontender, no erythema, warmth or drainage present  Psychiatric: He has a normal mood and affect.    Labs reviewed: Basic Metabolic Panel:  Recent Labs  01/17/16 1146 03/01/16 0819 03/28/16 1015  NA 142 142 141  K 4.0 3.9 4.4  CL 103 98 97*  CO2 31 33* 35*  GLUCOSE 85 87 78  BUN 18 32* 30*  CREATININE 1.76* 1.97* 2.19*  CALCIUM 9.2 9.3 8.6  TSH 4.92* 5.82* 5.71*   Liver Function Tests:  Recent Labs  10/28/15 0938 03/01/16 0819 03/28/16 1015  AST 30 40* 34  ALT 22 25 24   ALKPHOS 64 113 123*  BILITOT 0.8 0.8 0.9  PROT 7.8 7.5 7.4  ALBUMIN 4.2 4.2 4.1   No results for input(s): LIPASE, AMYLASE in the last 8760 hours. No results for input(s): AMMONIA in the last 8760 hours. CBC:  Recent Labs  10/28/15 0938 11/03/15 1006 03/01/16 0819 03/28/16 1015  WBC 4.9 5.9 3.6* 3.5*  NEUTROABS 3.7 4,130 2,160  --   HGB 11.5*  12.1* 13.0* 12.2*  HCT 38.0* 37.2* 40.9 38.8  MCV 100.8* 94.7 95.1 95.6  PLT 168 240 212 164   Lipid Panel:  Recent Labs  08/24/15 0825 03/01/16 0819 03/28/16 1015  CHOL 163 161 162  HDL 83 69 84  LDLCALC 69 80 70  TRIG 55 62 41  CHOLHDL 2.0 2.3 1.9   Lab Results  Component Value Date   HGBA1C 5.9 (H) 03/28/2016    Assessment/Plan 1. Venous ulcer of ankle, right ([place]) -unchanged when checked with NP who saw it last time -image obtained this time -clean with soap and water gently daily, then apply  silvadene, cover with telfa and secure with paper tape -f/u 3 wks with me - silver sulfADIAZINE (SILVADENE) 1 % cream; Apply 1 application topically daily. To right ankle wound  Dispense: 50 g; Refill: 3  2. Hypothyroidism, unspecified type -advised that if he needs more levothyroxine, he will need to get a refill at his pharmacy when he picks up silvadene  Samples of eliquis and tradjenta given  Labs/tests ordered:  No orders of the defined types were placed in this encounter.   Next appt:  Change appt to 3 wks  Afton Lavalle L. Yanelie Abraha, D.O. [facility] [address], [place] [address] Cell Phone (Mon-Fri 8am-5pm):  [phone] On Call:  [phone] & follow prompts after 5pm & weekends Office Phone:  [phone] Office Fax:  [phone]

## 2016-03-29 NOTE — Patient Instructions (Addendum)
Continue to cleanse with soap and water, then apply silvadene to open area of skin, cover with non-adherent dressing and secure with paper tape.  Call back if you develop more pain or drainage from the site.    Pick up your thyroid medicine refill when you go to the pharmacy for the cream.

## 2016-04-02 ENCOUNTER — Ambulatory Visit: Payer: Medicare HMO | Admitting: Internal Medicine

## 2016-04-03 ENCOUNTER — Telehealth: Payer: Self-pay | Admitting: *Deleted

## 2016-04-03 ENCOUNTER — Other Ambulatory Visit: Payer: Self-pay | Admitting: *Deleted

## 2016-04-03 DIAGNOSIS — I10 Essential (primary) hypertension: Secondary | ICD-10-CM

## 2016-04-03 MED ORDER — TORSEMIDE 20 MG PO TABS: 20.0000 mg | ORAL_TABLET | Freq: Every day | ORAL | 3 refills | Status: DC

## 2016-04-03 NOTE — Telephone Encounter (Signed)
-----   Message from Troy Sine, MD sent at 04/02/2016  8:08 AM EST ----- Cr  Increased ; decrease diuretic dose by 1/2. Re-check BMET in 2 weeks

## 2016-04-03 NOTE — Telephone Encounter (Signed)
Patient notified of lab results and recommendations. He voiced verbal understanding.

## 2016-04-06 ENCOUNTER — Telehealth: Payer: Self-pay

## 2016-04-06 NOTE — Telephone Encounter (Signed)
Spoke with patient, scheduled appointment for Monday. Patient verbalized understanding of instructions provided by Dr.[name]. I repeated twice to confirm full understanding.

## 2016-04-06 NOTE — Telephone Encounter (Signed)
1.  Needs to be seen next week.   2.  In the meantime, take tylenol 500mg  by mouth three times daily.  The ulcer on his ankle is going to hurt him some until we get it healed.  As long as he is not having increased redness, warmth or drainage from it, he can monitor it until I see him next week.  If those are occurring over the weekend, he should proceed to the ED.   3.  As far as his sinus pressure is concerned, w/o any of the symptoms you listed, he does not need antibiotics. Shortness of breath is more concerning.  Has he gained any weight since his appointment?

## 2016-04-06 NOTE — Telephone Encounter (Signed)
1.) Patient walked in complaining of ongoing right foot pain and states he needs some medication to help with pain. Patient was seen in office x 2 for right foot pain (per patient)   8/10 pain scale  2.) Patient also c/o sinus pressure and SOB x 2 weeks, patient denies, cough, fever, congestion, or any other symptoms. Pharmacy on file confirmed   No available appointments today. Please advise

## 2016-04-08 ENCOUNTER — Other Ambulatory Visit: Payer: Self-pay | Admitting: Internal Medicine

## 2016-04-08 DIAGNOSIS — E876 Hypokalemia: Secondary | ICD-10-CM

## 2016-04-09 ENCOUNTER — Inpatient Hospital Stay (HOSPITAL_COMMUNITY)
Admission: EM | Admit: 2016-04-09 | Discharge: 2016-04-12 | DRG: 291 | Disposition: A | Discharge status: Home / Self Care | Payer: Medicare HMO | Attending: Internal Medicine | Admitting: Internal Medicine

## 2016-04-09 ENCOUNTER — Encounter (HOSPITAL_COMMUNITY): Payer: Self-pay | Admitting: *Deleted

## 2016-04-09 ENCOUNTER — Telehealth: Payer: Self-pay

## 2016-04-09 ENCOUNTER — Ambulatory Visit (INDEPENDENT_AMBULATORY_CARE_PROVIDER_SITE_OTHER): Payer: Medicare HMO | Admitting: Internal Medicine

## 2016-04-09 ENCOUNTER — Emergency Department (HOSPITAL_COMMUNITY): Payer: Medicare HMO

## 2016-04-09 ENCOUNTER — Encounter: Payer: Self-pay | Admitting: Internal Medicine

## 2016-04-09 VITALS — BP 148/80 | HR 95 | Temp 97.9°F | Wt 258.0 lb

## 2016-04-09 DIAGNOSIS — R0609 Other forms of dyspnea: Secondary | ICD-10-CM

## 2016-04-09 DIAGNOSIS — I440 Atrioventricular block, first degree: Secondary | ICD-10-CM | POA: Diagnosis present

## 2016-04-09 DIAGNOSIS — Z7984 Long term (current) use of oral hypoglycemic drugs: Secondary | ICD-10-CM | POA: Diagnosis not present

## 2016-04-09 DIAGNOSIS — I5031 Acute diastolic (congestive) heart failure: Secondary | ICD-10-CM | POA: Diagnosis present

## 2016-04-09 DIAGNOSIS — Z833 Family history of diabetes mellitus: Secondary | ICD-10-CM | POA: Diagnosis not present

## 2016-04-09 DIAGNOSIS — L97319 Non-pressure chronic ulcer of right ankle with unspecified severity: Secondary | ICD-10-CM | POA: Diagnosis present

## 2016-04-09 DIAGNOSIS — I10 Essential (primary) hypertension: Secondary | ICD-10-CM | POA: Diagnosis not present

## 2016-04-09 DIAGNOSIS — I483 Typical atrial flutter: Secondary | ICD-10-CM | POA: Diagnosis not present

## 2016-04-09 DIAGNOSIS — Z825 Family history of asthma and other chronic lower respiratory diseases: Secondary | ICD-10-CM

## 2016-04-09 DIAGNOSIS — Z7901 Long term (current) use of anticoagulants: Secondary | ICD-10-CM

## 2016-04-09 DIAGNOSIS — E1129 Type 2 diabetes mellitus with other diabetic kidney complication: Secondary | ICD-10-CM | POA: Insufficient documentation

## 2016-04-09 DIAGNOSIS — R0902 Hypoxemia: Secondary | ICD-10-CM | POA: Diagnosis present

## 2016-04-09 DIAGNOSIS — R0602 Shortness of breath: Secondary | ICD-10-CM

## 2016-04-09 DIAGNOSIS — G4733 Obstructive sleep apnea (adult) (pediatric): Secondary | ICD-10-CM | POA: Diagnosis present

## 2016-04-09 DIAGNOSIS — N183 Chronic kidney disease, stage 3 (moderate): Secondary | ICD-10-CM

## 2016-04-09 DIAGNOSIS — E11622 Type 2 diabetes mellitus with other skin ulcer: Secondary | ICD-10-CM | POA: Diagnosis present

## 2016-04-09 DIAGNOSIS — I4891 Unspecified atrial fibrillation: Secondary | ICD-10-CM | POA: Diagnosis present

## 2016-04-09 DIAGNOSIS — N179 Acute kidney failure, unspecified: Secondary | ICD-10-CM

## 2016-04-09 DIAGNOSIS — J0100 Acute maxillary sinusitis, unspecified: Secondary | ICD-10-CM

## 2016-04-09 DIAGNOSIS — J9601 Acute respiratory failure with hypoxia: Secondary | ICD-10-CM

## 2016-04-09 DIAGNOSIS — Z87891 Personal history of nicotine dependence: Secondary | ICD-10-CM | POA: Diagnosis not present

## 2016-04-09 DIAGNOSIS — E8770 Fluid overload, unspecified: Secondary | ICD-10-CM | POA: Diagnosis present

## 2016-04-09 DIAGNOSIS — E039 Hypothyroidism, unspecified: Secondary | ICD-10-CM | POA: Diagnosis not present

## 2016-04-09 DIAGNOSIS — Z7951 Long term (current) use of inhaled steroids: Secondary | ICD-10-CM | POA: Diagnosis not present

## 2016-04-09 DIAGNOSIS — E1122 Type 2 diabetes mellitus with diabetic chronic kidney disease: Secondary | ICD-10-CM | POA: Diagnosis present

## 2016-04-09 DIAGNOSIS — I83013 Varicose veins of right lower extremity with ulcer of ankle: Secondary | ICD-10-CM

## 2016-04-09 DIAGNOSIS — I130 Hypertensive heart and chronic kidney disease with heart failure and stage 1 through stage 4 chronic kidney disease, or unspecified chronic kidney disease: Secondary | ICD-10-CM | POA: Diagnosis not present

## 2016-04-09 DIAGNOSIS — M1A9XX0 Chronic gout, unspecified, without tophus (tophi): Secondary | ICD-10-CM | POA: Diagnosis present

## 2016-04-09 DIAGNOSIS — Z79899 Other long term (current) drug therapy: Secondary | ICD-10-CM

## 2016-04-09 DIAGNOSIS — J181 Lobar pneumonia, unspecified organism: Secondary | ICD-10-CM

## 2016-04-09 DIAGNOSIS — I5033 Acute on chronic diastolic (congestive) heart failure: Secondary | ICD-10-CM | POA: Diagnosis not present

## 2016-04-09 DIAGNOSIS — J440 Chronic obstructive pulmonary disease with acute lower respiratory infection: Secondary | ICD-10-CM | POA: Diagnosis present

## 2016-04-09 DIAGNOSIS — E118 Type 2 diabetes mellitus with unspecified complications: Secondary | ICD-10-CM

## 2016-04-09 DIAGNOSIS — I4892 Unspecified atrial flutter: Secondary | ICD-10-CM | POA: Diagnosis not present

## 2016-04-09 DIAGNOSIS — J189 Pneumonia, unspecified organism: Secondary | ICD-10-CM

## 2016-04-09 DIAGNOSIS — E119 Type 2 diabetes mellitus without complications: Secondary | ICD-10-CM | POA: Diagnosis not present

## 2016-04-09 DIAGNOSIS — E785 Hyperlipidemia, unspecified: Secondary | ICD-10-CM | POA: Diagnosis present

## 2016-04-09 DIAGNOSIS — N1832 Chronic kidney disease, stage 3b: Secondary | ICD-10-CM | POA: Diagnosis present

## 2016-04-09 HISTORY — DX: Heart failure, unspecified: I50.9

## 2016-04-09 LAB — GLUCOSE, CAPILLARY: GLUCOSE-CAPILLARY: 89 mg/dL (ref 65–99)

## 2016-04-09 LAB — I-STAT TROPONIN, ED: Troponin i, poc: 0.03 ng/mL (ref 0.00–0.08)

## 2016-04-09 LAB — CBC
HCT: 40 % (ref 39.0–52.0)
Hemoglobin: 12 g/dL — ABNORMAL LOW (ref 13.0–17.0)
MCH: 30 pg (ref 26.0–34.0)
MCHC: 30 g/dL (ref 30.0–36.0)
MCV: 100 fL (ref 78.0–100.0)
Platelets: 147 10*3/uL — ABNORMAL LOW (ref 150–400)
RBC: 4 MIL/uL — ABNORMAL LOW (ref 4.22–5.81)
RDW: 17.2 % — ABNORMAL HIGH (ref 11.5–15.5)
WBC: 5.4 10*3/uL (ref 4.0–10.5)

## 2016-04-09 LAB — BASIC METABOLIC PANEL
Anion gap: 7 (ref 5–15)
BUN: 29 mg/dL — ABNORMAL HIGH (ref 6–20)
CO2: 29 mmol/L (ref 22–32)
Calcium: 9.1 mg/dL (ref 8.9–10.3)
Chloride: 104 mmol/L (ref 101–111)
Creatinine, Ser: 2.24 mg/dL — ABNORMAL HIGH (ref 0.61–1.24)
GFR calc Af Amer: 31 mL/min — ABNORMAL LOW (ref >60)
GFR calc non Af Amer: 27 mL/min — ABNORMAL LOW (ref >60)
Glucose, Bld: 92 mg/dL (ref 65–99)
Potassium: 5.5 mmol/L — ABNORMAL HIGH (ref 3.5–5.1)
Sodium: 140 mmol/L (ref 135–145)

## 2016-04-09 LAB — CBG MONITORING, ED
GLUCOSE-CAPILLARY: 78 mg/dL (ref 65–99)
Glucose-Capillary: 45 mg/dL — ABNORMAL LOW (ref 65–99)
Glucose-Capillary: 58 mg/dL — ABNORMAL LOW (ref 65–99)

## 2016-04-09 LAB — BRAIN NATRIURETIC PEPTIDE: B Natriuretic Peptide: 460.5 pg/mL — ABNORMAL HIGH (ref 0.0–100.0)

## 2016-04-09 LAB — PROCALCITONIN: Procalcitonin: 0.25 ng/mL

## 2016-04-09 LAB — STREP PNEUMONIAE URINARY ANTIGEN: STREP PNEUMO URINARY ANTIGEN: NEGATIVE

## 2016-04-09 MED ORDER — LEVOFLOXACIN IN D5W 750 MG/150ML IV SOLN: 750.0000 mg | INTRAVENOUS | Status: DC

## 2016-04-09 MED ORDER — ACETAMINOPHEN 325 MG PO TABS
650.0000 mg | ORAL_TABLET | Freq: Four times a day (QID) | ORAL | Status: DC | PRN
Start: 2016-04-09 — End: 2016-04-12
  Administered 2016-04-09: 650 mg via ORAL
  Filled 2016-04-09: qty 2

## 2016-04-09 MED ORDER — MONTELUKAST SODIUM 10 MG PO TABS
10.0000 mg | ORAL_TABLET | Freq: Every day | ORAL | Status: DC
  Administered 2016-04-09 – 2016-04-11 (×3): 10 mg via ORAL
  Filled 2016-04-09 (×3): qty 1

## 2016-04-09 MED ORDER — LEVOTHYROXINE SODIUM 75 MCG PO TABS
150.0000 ug | ORAL_TABLET | Freq: Every day | ORAL | Status: DC
  Administered 2016-04-10 – 2016-04-12 (×3): 150 ug via ORAL
  Filled 2016-04-09: qty 2
  Filled 2016-04-09: qty 1
  Filled 2016-04-09 (×3): qty 2

## 2016-04-09 MED ORDER — APIXABAN 5 MG PO TABS
5.0000 mg | ORAL_TABLET | Freq: Two times a day (BID) | ORAL | Status: DC
  Administered 2016-04-09 – 2016-04-12 (×6): 5 mg via ORAL
  Filled 2016-04-09 (×6): qty 1

## 2016-04-09 MED ORDER — IPRATROPIUM-ALBUTEROL 0.5-2.5 (3) MG/3ML IN SOLN: 3.0000 mL | RESPIRATORY_TRACT | Status: DC | PRN

## 2016-04-09 MED ORDER — INSULIN ASPART 100 UNIT/ML ~~LOC~~ SOLN: 0.0000 [IU] | Freq: Three times a day (TID) | SUBCUTANEOUS | Status: DC

## 2016-04-09 MED ORDER — POLYETHYLENE GLYCOL 3350 17 G PO PACK
17.0000 g | PACK | Freq: Every day | ORAL | Status: DC | PRN
  Administered 2016-04-11: 17 g via ORAL
  Filled 2016-04-09: qty 1

## 2016-04-09 MED ORDER — FUROSEMIDE 10 MG/ML IJ SOLN
80.0000 mg | Freq: Once | INTRAMUSCULAR | Status: AC
  Administered 2016-04-09: 80 mg via INTRAVENOUS
  Filled 2016-04-09: qty 8

## 2016-04-09 MED ORDER — PRAVASTATIN SODIUM 20 MG PO TABS
20.0000 mg | ORAL_TABLET | Freq: Every day | ORAL | Status: DC
  Administered 2016-04-09 – 2016-04-12 (×4): 20 mg via ORAL
  Filled 2016-04-09 (×4): qty 1

## 2016-04-09 MED ORDER — FUROSEMIDE 10 MG/ML IJ SOLN
40.0000 mg | Freq: Two times a day (BID) | INTRAMUSCULAR | Status: AC
  Administered 2016-04-10 – 2016-04-11 (×4): 40 mg via INTRAVENOUS
  Filled 2016-04-09 (×4): qty 4

## 2016-04-09 MED ORDER — ALLOPURINOL 100 MG PO TABS
100.0000 mg | ORAL_TABLET | Freq: Every day | ORAL | Status: DC
  Administered 2016-04-10 – 2016-04-12 (×3): 100 mg via ORAL
  Filled 2016-04-09 (×3): qty 1

## 2016-04-09 MED ORDER — HYDROCODONE-ACETAMINOPHEN 5-325 MG PO TABS
1.0000 | ORAL_TABLET | ORAL | Status: DC | PRN
  Administered 2016-04-09 – 2016-04-12 (×3): 2 via ORAL
  Filled 2016-04-09 (×3): qty 2

## 2016-04-09 MED ORDER — AMIODARONE HCL 200 MG PO TABS
200.0000 mg | ORAL_TABLET | Freq: Two times a day (BID) | ORAL | Status: DC
  Administered 2016-04-09 – 2016-04-12 (×6): 200 mg via ORAL
  Filled 2016-04-09 (×6): qty 1

## 2016-04-09 MED ORDER — FLUTICASONE FUROATE-VILANTEROL 100-25 MCG/INH IN AEPB
1.0000 | INHALATION_SPRAY | Freq: Every day | RESPIRATORY_TRACT | Status: DC
  Administered 2016-04-10 – 2016-04-12 (×3): 1 via RESPIRATORY_TRACT
  Filled 2016-04-09: qty 28

## 2016-04-09 MED ORDER — LEVOFLOXACIN IN D5W 500 MG/100ML IV SOLN
500.0000 mg | INTRAVENOUS | Status: DC
  Administered 2016-04-09: 500 mg via INTRAVENOUS
  Filled 2016-04-09: qty 100

## 2016-04-09 NOTE — Progress Notes (Signed)
Location:  [facility] clinic Provider: Kristoph Sattler L. Mariea Clonts, D.O., C.M.D.  Code Status: full code Goals of Care:  Advanced Directives 03/13/2016  Does Patient Have a Medical Advance Directive? No  Copy of Healthcare Power of Attorney in Chart? -  Would patient like information on creating a medical advance directive? -  still has not completed this paperwork  Chief Complaint  Patient presents with  . Acute Visit    Sinus, SOB right foot pain    HPI: Patient is a 76 y.o. male seen today for an acute visit for sinus congestion, sob and right foot pain.   Started on Wednesday.  Was having phlegm coming up and coughing.  He was not sleeping for fear of choking.  He actually had not been sleeping well before that for a few weeks before that per his wife.  She reports he had similar symptoms when he was first hospitalized with CHF.  No fever or chills.  Sometimes could not eat b/c of phlegm and sob.    A little coughing.  Could not hardly walk around in th epast few days.  Head was spinning.   He weighed 241 on 2/22 and now weighs 258 lbs on 3/5.  They tried otc allergy medication which did not help.  2/26, his diuretic was reduced by half with recheck bmp in 2 weeks (Dr. Claiborne Billings) due to worsening renal function (cr 2.19 up from 1.97).  He's had increased bruising on his sides that Earlie Server has noticed.  He remains on eliquis 5mg  po bid.  He has severe orthopnea and PND.  He has no increase in peripheral edema.  His O2 here was 78% on RA and sat did improve to 95% on 3L.    The right medial ankle wound is healing slowly.  He is using the silver and telfa and paper tape on it.  The pain is over his posterior ankle and sometimes goes up his shin.    Last echo 08/18/15 showed EF 60-65%, mildly increased PA pressure of 3mmHg.  He has chronic diastolic chf.    Past Medical History:  Diagnosis Date  . Allergy   . Asthma   . Atrophic gastritis   . Benign essential hypertension   . COPD (chronic obstructive  pulmonary disease) ([place])   . Diabetes ([place])   . GERD (gastroesophageal reflux disease)   . Gout attack 09/2012  . Sinusitis, maxillary, chronic   . Type II or unspecified type diabetes mellitus without mention of complication, not stated as uncontrolled     Past Surgical History:  Procedure Laterality Date  . COLONOSCOPY  2003  . HERNIA REPAIR  1980    No Known Allergies  Allergies as of 04/09/2016   No Known Allergies     Medication List       Accurate as of 04/09/16 11:17 AM. Always use your most recent med list.          albuterol (2.5 MG/3ML) 0.083% nebulizer solution Commonly known as:  PROVENTIL INHALE CONTENTS OF 1 VIAL IN NEBULIZER EVERY 4 HOURS AS NEEDED FOR WHEEZING AND ASTHMA   allopurinol 300 MG tablet Commonly known as:  ZYLOPRIM TAKE 1 TABLET BY MOUTH EVERY DAY   amiodarone 200 MG tablet Commonly known as:  PACERONE TAKE 1 TABLET TWICE A DAY   apixaban 5 MG Tabs tablet Commonly known as:  ELIQUIS Take 1 tablet (5 mg total) by mouth 2 (two) times daily.   CENTRUM SILVER ADULT 50+ PO Take 1  tablet by mouth daily.   fluticasone 50 MCG/ACT nasal spray Commonly known as:  FLONASE Place 1 spray into both nostrils daily as needed for allergies.   fluticasone furoate-vilanterol 100-25 MCG/INH Aepb Commonly known as:  BREO ELLIPTA Inhale 1 puff into the lungs daily.   KLOR-CON M20 20 MEQ tablet Generic drug:  potassium chloride SA TAKE 1 TABLET BY MOUTH EVERY DAY   levothyroxine 175 MCG tablet Commonly known as:  SYNTHROID, LEVOTHROID Take 1 tablet (175 mcg total) by mouth daily before breakfast.   linagliptin 5 MG Tabs tablet Commonly known as:  TRADJENTA Take 1 tablet (5 mg total) by mouth daily.   montelukast 10 MG tablet Commonly known as:  SINGULAIR Take one tablet by mouth at bedtime as needed for allergies and asthma   pravastatin 20 MG tablet Commonly known as:  PRAVACHOL TAKE 1 TABLET BY MOUTH EVERY DAY WITH SUPPER   silver  sulfADIAZINE 1 % cream Commonly known as:  SILVADENE Apply 1 application topically daily. To right ankle wound   torsemide 20 MG tablet Commonly known as:  DEMADEX Take 1 tablet (20 mg total) by mouth daily. Heart failure       Review of Systems:  Review of Systems  Constitutional: Positive for malaise/fatigue. Negative for chills and fever.       Weight gain  HENT: Positive for congestion. Negative for sore throat.   Eyes: Negative for redness.  Respiratory: Positive for cough, sputum production and shortness of breath. Negative for wheezing and stridor.   Cardiovascular: Positive for orthopnea and PND. Negative for chest pain, palpitations, claudication and leg swelling.  Gastrointestinal: Negative for abdominal pain.  Genitourinary: Positive for frequency. Negative for dysuria and urgency.  Musculoskeletal: Negative for falls.  Skin: Negative for itching and rash.       Right medial ankle venous ulcer  Neurological: Positive for weakness and headaches. Negative for dizziness and loss of consciousness.       Some wooziness when walking around  Endo/Heme/Allergies: Bruises/bleeds easily.       Increased bruising  Psychiatric/Behavioral: Positive for memory loss.       Cognitive impairment    Health Maintenance  Topic Date Due  . URINE MICROALBUMIN  05/29/2016  . HEMOGLOBIN A1C  09/25/2016  . OPHTHALMOLOGY EXAM  12/12/2016  . FOOT EXAM  03/02/2017  . TETANUS/TDAP  02/05/2021  . INFLUENZA VACCINE  Completed  . PNA vac Low Risk Adult  Completed    Physical Exam: Vitals:   04/09/16 1102  BP: (!) 148/80  Pulse: 95  Temp: 97.9 F (36.6 C)  TempSrc: Oral  SpO2: 95%  Weight: 258 lb (117 kg)   Body mass index is 41.64 kg/m. Physical Exam  Constitutional: He is oriented to person, place, and time. He appears well-developed and well-nourished. He appears distressed.  Dyspneic on exertion and even sitting in chair, was placed on O2 for sat 78 which came up to 95 on 3L    HENT:  Head: Normocephalic and atraumatic.  Cardiovascular:  irreg irreg  Pulmonary/Chest: He is in respiratory distress. He has rales.  Coarse rales throughout lung fields  Abdominal: Bowel sounds are normal.  Neurological: He is alert and oriented to person, place, and time.  Difficult to understand with so much fluid and mucus in his throat  Skin: Skin is warm and dry.  Venous ulcer gradually healing right medial ankle  Psychiatric: He has a normal mood and affect.    Labs reviewed: Basic Metabolic Panel:  Recent Labs  01/17/16 1146 03/01/16 0819 03/28/16 1015  NA 142 142 141  K 4.0 3.9 4.4  CL 103 98 97*  CO2 31 33* 35*  GLUCOSE 85 87 78  BUN 18 32* 30*  CREATININE 1.76* 1.97* 2.19*  CALCIUM 9.2 9.3 8.6  TSH 4.92* 5.82* 5.71*   Liver Function Tests:  Recent Labs  10/28/15 0938 03/01/16 0819 03/28/16 1015  AST 30 40* 34  ALT 22 25 24   ALKPHOS 64 113 123*  BILITOT 0.8 0.8 0.9  PROT 7.8 7.5 7.4  ALBUMIN 4.2 4.2 4.1   No results for input(s): LIPASE, AMYLASE in the last 8760 hours. No results for input(s): AMMONIA in the last 8760 hours. CBC:  Recent Labs  10/28/15 0938 11/03/15 1006 03/01/16 0819 03/28/16 1015  WBC 4.9 5.9 3.6* 3.5*  NEUTROABS 3.7 4,130 2,160  --   HGB 11.5* 12.1* 13.0* 12.2*  HCT 38.0* 37.2* 40.9 38.8  MCV 100.8* 94.7 95.1 95.6  PLT 168 240 212 164   Lipid Panel:  Recent Labs  08/24/15 0825 03/01/16 0819 03/28/16 1015  CHOL 163 161 162  HDL 83 69 84  LDLCALC 69 80 70  TRIG 55 62 41  CHOLHDL 2.0 2.3 1.9   Lab Results  Component Value Date   HGBA1C 5.9 (H) 03/28/2016    Assessment/Plan 1. Acute respiratory failure with hypoxia (HCC) -hypoxic at 78% on RA here, did improve to 95% with 3 liters -needs ED obs vs. Admission to diurese  2. Acute on chronic diastolic CHF (congestive heart failure) ([place]) -? Worsening renal function causing his acute exacerbation and lack of response to just daily torsemide  -has  weight gain, dyspnea, rales and pulmonary edema by exam--needs diuresis IV which we cannot do outpatient -discussed with Dr. Claiborne Billings who agreed with ED evaluation--pt's wife bringing him now  3. Acute renal failure superimposed on stage 3 chronic kidney disease, unspecified acute renal failure type ([place]) -suspect even worse at this time due to increased bruising on eliquis and volume overload -needs bmp asap  4. Venous ulcer of ankle, right (HCC) -healing gradually  5. Acute non-recurrent maxillary sinusitis -by report he's having sinus congestion also--was minor at this time compared to big picture chf  6. Benign essential hypertension -elevated today due to volume  Labs/tests ordered:  Referred to ED for bmp and diuresis Next appt:  05/29/2016  Chantrell Apsey L. Gladiola Madore, D.O. [facility] [address], [place] [address] Cell Phone (Mon-Fri 8am-5pm):  [phone] On Call:  [phone] & follow prompts after 5pm & weekends Office Phone:  [phone] Office Fax:  [phone]

## 2016-04-09 NOTE — ED Notes (Signed)
Pt provided with OJ and graham crackers and peanut butter until dinner tray is delivered.

## 2016-04-09 NOTE — ED Notes (Signed)
Informed RN of patients current post meal blood sugar. Give patient apple juice and peanut butter and informed patient that I would return in approx. 30 minutes to recheck CBG level.

## 2016-04-09 NOTE — H&P (Signed)
History and Physical    Omar Lambert KPT:[id number] DOB: 1940/02/12 DOA: 04/09/2016  PCP: Omar Kinnier, DO   Patient coming from: PCP office   Chief Complaint: Dyspnea on exertion, weight gain  HPI: Omar Lambert is a 76 y.o. male with medical history significant for a checkup, diabetes mellitus, obstructive sleep apnea on CPAP at night, CKD who presented to the emergency department with complaints of shortness of breath especially with exertion and weight gain. He was seen today at the primary orifice was above complaints.  The PCP contacted his cardiologist, Dr. Claiborne Lambert who recommended patient come to the emergency room for evaluation Patient was seen in the emergency room a few weeks ago with complaints of worsening shortness of breath and feeling like he has fluid in his lungs, he also complained of lower extremity swelling, weight gain and dizziness. Patient also noted that his SOB gets worse with attempts to lay flat His last weight was 241 pounds on 03/29/2016 and today his weight is 2556 ponds The dose of his Lasix was recently reduced in half d/t worsening of renal function -  creatinine went up to 2.19 from 1.97 (baseline creatinine 1.60-1.80)  He denies fever, or any other associated symptoms.   ED Course: On arrival to the emergency department his vital signs were stable except intermittent episodes of tachypnea. Blood work demonstrated hemoglobin of 12.0 hematocrit 40% normal white blood cells count, creatinine 2.24 and BUN 29, potassium was mildly elevated at 5.5 He was elevated to 460.5 which seemed to be chronically elevated and troponin was 0.03  Chest x-ray demonstrated CHF with mild interstitial edema and new small right pleural effusion. There was a confluent density in the posterior right lung suggestive of atelectasis versus pneumonia and trial of antibiotic therapy was recommended with follow-up chest x-ray in 3-4 weeks to ensure resolution of this density and exclude  underlying malignancy  Review of Systems: As per HPI otherwise 10 point review of systems negative.   Ambulatory Status: Independent    Past Medical History:  Diagnosis Date  . Allergy   . Asthma   . Atrophic gastritis   . Benign essential hypertension   . CHF (congestive heart failure) ([place])   . COPD (chronic obstructive pulmonary disease) ([place])   . Diabetes ([place])   . GERD (gastroesophageal reflux disease)   . Gout attack 09/2012  . Sinusitis, maxillary, chronic   . Type II or unspecified type diabetes mellitus without mention of complication, not stated as uncontrolled     Past Surgical History:  Procedure Laterality Date  . COLONOSCOPY  2003  . HERNIA REPAIR  1980    Social History   Social History  . Marital status: Married    Spouse name: N/A  . Number of children: 3  . Years of education: N/A   Occupational History  . Retired    Social History Main Topics  . Smoking status: Former Smoker    Packs/day: 1.00    Years: 20.00    Types: Cigarettes    Quit date: 06/17/1997  . Smokeless tobacco: Never Used  . Alcohol use No  . Drug use: No  . Sexual activity: Not Currently   Other Topics Concern  . Not on file   Social History Narrative   Lives in 1 story apt with his wife, Omar Lambert.  Sometimes exercises.  Drinks coffee.      No Known Allergies  Family History  Problem Relation Age of Onset  . Stroke Mother   .  Cancer Sister   . Diabetes Sister   . COPD Sister   . COPD Sister     Prior to Admission medications   Medication Sig Start Date End Date Taking? Authorizing Provider  acetaminophen (TYLENOL) 325 MG tablet Take 650 mg by mouth every 6 (six) hours as needed for mild pain.   Yes Historical Provider, MD  albuterol (PROVENTIL) (2.5 MG/3ML) 0.083% nebulizer solution INHALE CONTENTS OF 1 VIAL IN NEBULIZER EVERY 4 HOURS AS NEEDED FOR WHEEZING AND ASTHMA 02/08/16  Yes Omar L [name], DO  allopurinol (ZYLOPRIM) 300 MG tablet TAKE 1 TABLET BY  MOUTH EVERY DAY 11/21/15  Yes Omar Cranker, DO  amiodarone (PACERONE) 200 MG tablet TAKE 1 TABLET TWICE A DAY 02/16/16  Yes Omar Sine, MD  apixaban (ELIQUIS) 5 MG TABS tablet Take 1 tablet (5 mg total) by mouth 2 (two) times daily. 09/29/15  Yes Omar L [name], DO  fluticasone (FLONASE) 50 MCG/ACT nasal spray Place 1 spray into both nostrils daily as needed for allergies.  10/25/15  Yes Historical Provider, MD  fluticasone furoate-vilanterol (BREO ELLIPTA) 100-25 MCG/INH AEPB Inhale 1 puff into the lungs daily. 11/03/15  Yes Omar Chandler, NP  KLOR-CON M20 20 MEQ tablet TAKE 1 TABLET BY MOUTH EVERY DAY 04/09/16  Yes Omar L [name], DO  levothyroxine (SYNTHROID, LEVOTHROID) 150 MCG tablet Take 150 mcg by mouth daily before breakfast.   Yes Historical Provider, MD  linagliptin (TRADJENTA) 5 MG TABS tablet Take 1 tablet (5 mg total) by mouth daily. 09/30/15  Yes Omar L [name], DO  montelukast (SINGULAIR) 10 MG tablet Take one tablet by mouth at bedtime as needed for allergies and asthma 09/27/15  Yes Omar Chandler, NP  Multiple Vitamins-Minerals (CENTRUM SILVER ADULT 50+ PO) Take 1 tablet by mouth daily.   Yes Historical Provider, MD  pravastatin (PRAVACHOL) 20 MG tablet TAKE 1 TABLET BY MOUTH EVERY DAY WITH SUPPER 03/20/16  Yes Omar L [name], DO  silver sulfADIAZINE (SILVADENE) 1 % cream Apply 1 application topically daily. To right ankle wound 03/29/16  Yes Omar L [name], DO  torsemide (DEMADEX) 20 MG tablet Take 1 tablet (20 mg total) by mouth daily. Heart failure 04/03/16  Yes Omar Sine, MD  levothyroxine (SYNTHROID, LEVOTHROID) 175 MCG tablet Take 1 tablet (175 mcg total) by mouth daily before breakfast. Patient not taking: Reported on 04/09/2016 03/02/16   Omar Curry, DO    Physical Exam: Vitals:   04/09/16 1224 04/09/16 1229 04/09/16 1413 04/09/16 1545  BP: 159/81   150/99  Pulse: 63   64  Resp: 20   (!) 27  Temp: 97.5 F (36.4 C)     TempSrc: Oral     SpO2: 94%  94% 90%    Weight:  116.3 kg (256 lb 8 oz)    Height:  5\' 11"  (1.803 m)       General: Appears calm and comfortable Eyes: PERRLA, EOMI, normal lids, iris ENT:  grossly normal hearing, lips & tongue, mucous membranes moist and intact Neck: no lymphoadenopathy, masses or thyromegaly Cardiovascular: RRR, no m/r/g. No JVD, carotid bruits. 1-2+ pitting LE edema.  Respiratory: bilateral no wheezes, rales, rhonchi, bibasilar cracles. Normal respiratory effort. No accessory muscle use observed Abdomen: soft, non-tender, non-distended, no organomegaly or masses appreciated. BS present in all quadrants Skin: no rash, ulcers or induration seen on limited exam Musculoskeletal: grossly normal tone BUE/BLE, good ROM, no bony abnormality or joint deformities observed Psychiatric: grossly normal mood and affect,  speech fluent and appropriate, alert and oriented x3 Neurologic: CN II-XII grossly intact, moves all extremities in coordinated fashion, sensation intact  Labs on Admission: I have personally reviewed following labs and imaging studies  CBC, BMP  GFR: Estimated Creatinine Clearance: 36.4 mL/min (by C-G formula based on SCr of 2.24 mg/dL (H)).   Creatinine Clearance: Estimated Creatinine Clearance: 36.4 mL/min (by C-G formula based on SCr of 2.24 mg/dL (H)).    Radiological Exams on Admission: Dg Chest 2 View  Result Date: 04/09/2016 CLINICAL DATA:  Two weeks of shortness of breath and peripheral edema and chest pressure. History of atrial flutter, COPD, hypertension, obesity. Former smoker. EXAM: CHEST  2 VIEW COMPARISON:  PA and lateral chest x-ray of October 28, 2015 FINDINGS: The lungs are well-expanded. There is a new small right pleural effusion however. There is hazy increased density posteriorly in the right lower lobe. The cardiac silhouette is enlarged. The pulmonary vascularity is engorged and exhibits mild cephalization. There is calcification in the wall of the aortic arch. The bony  thorax exhibits no acute abnormality. IMPRESSION: CHF with mild interstitial edema and new small right pleural effusion. In addition confluent density posteriorly in the right lung suggests atelectasis or early pneumonia. Followup PA and lateral chest X-ray is recommended in 3-4 weeks following trial of antibiotic therapy to ensure resolution and exclude underlying malignancy. Thoracic aortic atherosclerosis. Electronically Signed   By: David  Martinique M.D.   On: 04/09/2016 13:46    EKG: Independently reviewed - sinus rhythm, first-degree AV block, no new acute changes    Assessment/Plan Active Problems:   Benign essential hypertension   Diabetes mellitus type II, controlled (HCC)   Chronic kidney disease (CKD) stage G3b/A2, moderately decreased glomerular filtration rate (GFR) between 30-44 mL/min/1.73 square meter and albuminuria creatinine ratio between 30-299 mg/g   Atrial flutter (HCC)   Acute diastolic CHF (congestive heart failure) (HCC)   Hypothyroidism   Dyspnea on exertion   CAP (community acquired pneumonia)     Dyspnea on exertion - could be multifactorial, secondary to CHF exacerbation given patient's symptoms, weight gain and possibly superimposed pneumonia Patient received a dose of 80 mg IV Lasix with improvement in symptoms Deferred to the rounding physician to continue IV versus oral diuretic therapy based on patient's response to diuretic therapy and renal function Continue daily weights, monitor I/O, maintain a low-salt diet  Presumed community-acquired pneumonia We'll start on IV antibiotics, Duoneb prn Continue to monitor WBC's count and fever   DM type II - most recent HgbA1C is 5.9% on 03/28/16 Hold oral meds while hospitalized Continue diabetic diet, monitor FSBS QACHS, add sliding scale insulin  Hypertension - currently stable Continue home medication and adjust the doses if needed depending on the BP readings  Hypothyroidism - most recent TSH Continue  Synthroid and if needed, adjust the dose  Afib / atrial nflutter - continue Amiodarone and Eliquis    DVT prophylaxis: Eliquis Code Status: Full Family Communication: none Disposition Plan: telemetry Consults called: none Admission status: observation   York Grice, PA-C Pager: [phone] [facility]  If 7PM-7AM, please contact night-coverage [email or web address] Password [facility]  04/09/2016, 4:44 PM

## 2016-04-09 NOTE — Telephone Encounter (Signed)
Per Dr.[name] I called [facility] and spoke with Abigail Butts (triage assistant) to inform her patient will be coming to the ER by private vechicle for SOB, fluid retention, and heart failure.  Dr.[name] also spoke directly with [facility] (patient's cardiologist) who gave the directive that patient should be sent to ER.

## 2016-04-09 NOTE — ED Provider Notes (Signed)
[facility] DEPT Provider Note   CSN: [id number] Arrival date & time: 04/09/16  1215  By signing my name below, I, Omar Lambert, attest that this documentation has been prepared under the direction and in the presence of Omar Manifold, MD. Electronically Signed: Reola Lambert, ED Scribe. 04/09/16. 1:58 PM.  History   Chief Complaint Chief Complaint  Patient presents with  . Shortness of Breath   The history is provided by the patient and medical records. No language interpreter was used.    HPI Comments: Omar Lambert is a 76 y.o. male with a PMHx of CHF, DM, OSA on CPAP, CKD stage G3b/A2, who presents to the Emergency Department complaining of persistent, gradually worsening shortness of breath beginning several weeks ago. He describes this as feeling like "fluid on his lungs". He notes associated increase in leg swelling w/ weight gain, dizziness and a generalized headache secondary to the onset of his worsening shortness of breath. He additionally states that he has mild cough productive of phlegm. His shortness of breath is worse with laying supine and upon exertion. Pt was seen by his PCP earlier today who referred him into the ED for possible admission and need for IV diuresis. Per his PCP's note, he weighed in at 241lbs on 02/22 and was up 258lbs at his visit today. Additionally, his Cardiologist altered his diuretic on 04/02/06 to be reduced by half d/t worsening renal function (Cr up 2.19 from 1.97). He denies fever, or any other associated symptoms.   Last echo 08/18/15 showed EF 60-65%, mildly increased PA pressure of 65mmHg.  Cardiologist: [name], MD  PCP: [name], DO  Past Medical History:  Diagnosis Date  . Allergy   . Asthma   . Atrophic gastritis   . Benign essential hypertension   . CHF (congestive heart failure) ([place])   . COPD (chronic obstructive pulmonary disease) ([place])   . Diabetes ([place])   . GERD (gastroesophageal reflux disease)     . Gout attack 09/2012  . Sinusitis, maxillary, chronic   . Type II or unspecified type diabetes mellitus without mention of complication, not stated as uncontrolled    Patient Active Problem List   Diagnosis Date Noted  . Venous ulcer of ankle, right ([employer]) 03/29/2016  . Hypothyroidism 03/29/2016  . Acute CHF ([place]) 10/29/2015  . Cough 10/28/2015  . CHF exacerbation ([place]) 10/28/2015  . Pulmonary edema 08/18/2015  . Acute diastolic CHF (congestive heart failure) ([place]) 08/18/2015  . Essential hypertension, benign 02/28/2015  . 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 Morbid obesity due to excess calories ([place]) 02/28/2015  . Hyperlipidemia associated with type 2 diabetes mellitus ([place]) 02/28/2015  . Persistent atrial fibrillation ([place]) 12/18/2014  . Obesity (BMI 30-39.9) 12/18/2014  . Alteration in anticoagulation 12/18/2014  . Atrial flutter ([place]) 11/12/2014  . Type 2 diabetes mellitus, controlled, with renal complications ([place]) 30/10/2328  . Chronic kidney disease (CKD) stage G3b/A2, moderately decreased glomerular filtration rate (GFR) between 30-44 mL/min/1.73 square meter and albuminuria creatinine ratio between 30-299 mg/g 08/19/2014  . Common wart 08/19/2014  . Noncompliance with medication regimen 02/15/2014  . Environmental allergies 02/15/2014  . 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 Belching 09/18/2013  . Esophageal reflux 09/18/2013  . Acute gout 06/08/2013  . Diabetes mellitus type II, controlled ([place]) 06/08/2013  . Venous insufficiency of both lower extremities 03/14/2013  . OSA on CPAP 12/22/2012  . COPD GOLD II 11/26/2012  . Severe obesity (BMI >= 40) ([place]) 10/16/2012  . Sinusitis, maxillary, chronic   .  Asthma   . Allergy   . Benign essential hypertension   . Gout attack 09/05/2012   Past Surgical History:  Procedure Laterality Date  . COLONOSCOPY  2003  . HERNIA REPAIR  1980    Home Medications    Prior to Admission medications   Medication Sig Start Date End Date  Taking? Authorizing Provider  albuterol (PROVENTIL) (2.5 MG/3ML) 0.083% nebulizer solution INHALE CONTENTS OF 1 VIAL IN NEBULIZER EVERY 4 HOURS AS NEEDED FOR WHEEZING AND ASTHMA 02/08/16   [name] L [name], DO  allopurinol (ZYLOPRIM) 300 MG tablet TAKE 1 TABLET BY MOUTH EVERY DAY 11/21/15   Gildardo Cranker, DO  amiodarone (PACERONE) 200 MG tablet TAKE 1 TABLET TWICE A DAY 02/16/16   Troy Sine, MD  apixaban (ELIQUIS) 5 MG TABS tablet Take 1 tablet (5 mg total) by mouth 2 (two) times daily. 09/29/15   [name] L [name], DO  fluticasone (FLONASE) 50 MCG/ACT nasal spray Place 1 spray into both nostrils daily as needed for allergies.  10/25/15   Historical Provider, MD  fluticasone furoate-vilanterol (BREO ELLIPTA) 100-25 MCG/INH AEPB Inhale 1 puff into the lungs daily. 11/03/15   Lauree Chandler, NP  KLOR-CON M20 20 MEQ tablet TAKE 1 TABLET BY MOUTH EVERY DAY 04/09/16   [name] L [name], DO  levothyroxine (SYNTHROID, LEVOTHROID) 175 MCG tablet Take 1 tablet (175 mcg total) by mouth daily before breakfast. 03/02/16   [name] L [name], DO  linagliptin (TRADJENTA) 5 MG TABS tablet Take 1 tablet (5 mg total) by mouth daily. 09/30/15   [name] L [name], DO  montelukast (SINGULAIR) 10 MG tablet Take one tablet by mouth at bedtime as needed for allergies and asthma 09/27/15   Lauree Chandler, NP  Multiple Vitamins-Minerals (CENTRUM SILVER ADULT 50+ PO) Take 1 tablet by mouth daily.    Historical Provider, MD  pravastatin (PRAVACHOL) 20 MG tablet TAKE 1 TABLET BY MOUTH EVERY DAY WITH SUPPER 03/20/16   [name] L [name], DO  silver sulfADIAZINE (SILVADENE) 1 % cream Apply 1 application topically daily. To right ankle wound 03/29/16   [name] L [name], DO  torsemide (DEMADEX) 20 MG tablet Take 1 tablet (20 mg total) by mouth daily. Heart failure 04/03/16   Troy Sine, MD   Family History Family History  Problem Relation Age of Onset  . Stroke Mother   . Cancer Sister   . Diabetes Sister   . COPD Sister   . COPD Sister     Social History Social History  Substance Use Topics  . Smoking status: Former Smoker    Packs/day: 1.00    Years: 20.00    Types: Cigarettes    Quit date: 06/17/1997  . Smokeless tobacco: Never Used  . Alcohol use No   Allergies   Patient has no known allergies.  Review of Systems Review of Systems  Constitutional: Positive for unexpected weight change. Negative for fever.  Respiratory: Positive for shortness of breath. Negative for cough.   Cardiovascular: Positive for leg swelling.  Neurological: Positive for dizziness and headaches.  All other systems reviewed and are negative.  Physical Exam Updated Vital Signs BP 159/81 (BP Location: Right Arm)   Pulse 63   Temp 97.5 F (36.4 C) (Oral)   Resp 20   Ht 5\' 11"  (1.803 m)   Wt 256 lb 8 oz (116.3 kg)   SpO2 94%   BMI 35.77 kg/m   Physical Exam  Constitutional: He appears well-developed and well-nourished.  HENT:  Head: Normocephalic.  Right Ear: External ear normal.  Left Ear: External ear normal.  Nose: Nose normal.  Eyes: Conjunctivae are normal. Right eye exhibits no discharge. Left eye exhibits no discharge.  Neck: Normal range of motion.  Cardiovascular: Normal rate, regular rhythm and normal heart sounds.   No murmur heard. Pulmonary/Chest: Effort normal. No respiratory distress. He has no wheezes. He has no rales.  Crackles heard in the bilateral bases. Dyspneic just with sitting up in bed.   Abdominal: Soft. There is no tenderness. There is no rebound and no guarding.  Musculoskeletal: Normal range of motion. He exhibits no edema or tenderness.  Several symmetric pitting lower extremity edema. Small ulcer to the medial malleolus of the right ankle that appears to be healing well.   Neurological: He is alert. No cranial nerve deficit. Coordination normal.  Skin: Skin is warm and dry. No rash noted. No erythema. No pallor.  Psychiatric: He has a normal mood and affect. His behavior is normal.  Nursing  note and vitals reviewed.  ED Treatments / Results  DIAGNOSTIC STUDIES: Oxygen Saturation is 94% on RA, adequate by my interpretation.   COORDINATION OF CARE: 1:54 PM-Discussed next steps with pt. Pt verbalized understanding and is agreeable with the plan.   Labs (all labs ordered are listed, but only abnormal results are displayed) Labs Reviewed  BASIC METABOLIC PANEL - Abnormal; Notable for the following:       Result Value   Potassium 5.5 (*)    BUN 29 (*)    Creatinine, Ser 2.24 (*)    GFR calc non Af Amer 27 (*)    GFR calc Af Amer 31 (*)    All other components within normal limits  CBC - Abnormal; Notable for the following:    RBC 4.00 (*)    Hemoglobin 12.0 (*)    RDW 17.2 (*)    Platelets 147 (*)    All other components within normal limits  BRAIN NATRIURETIC PEPTIDE - Abnormal; Notable for the following:    B Natriuretic Peptide 460.5 (*)    All other components within normal limits  I-STAT TROPOININ, ED   EKG  EKG Interpretation  Date/Time:  Monday April 09 2016 12:22:27 EST Ventricular Rate:  65 PR Interval:    QRS Duration: 90 QT Interval:  438 QTC Calculation: 455 R Axis:   -124 Text Interpretation:  Sinus rhythm with 1st degree A-V block Right superior axis deviation Low voltage QRS Cannot rule out Anterior infarct --Noted Sept 2017 Abnormal ECG Confirmed by Jeneen Rinks  MD, [place] ([id number]) on 04/09/2016 12:35:37 PM Also confirmed by Jeneen Rinks  MD, [place] ([id number]), editor Drema Pry [phone])  on 04/09/2016 1:01:34 PM      Radiology Dg Chest 2 View  Result Date: 04/09/2016 CLINICAL DATA:  Two weeks of shortness of breath and peripheral edema and chest pressure. History of atrial flutter, COPD, hypertension, obesity. Former smoker. EXAM: CHEST  2 VIEW COMPARISON:  PA and lateral chest x-ray of October 28, 2015 FINDINGS: The lungs are well-expanded. There is a new small right pleural effusion however. There is hazy increased density posteriorly in the right lower  lobe. The cardiac silhouette is enlarged. The pulmonary vascularity is engorged and exhibits mild cephalization. There is calcification in the wall of the aortic arch. The bony thorax exhibits no acute abnormality. IMPRESSION: CHF with mild interstitial edema and new small right pleural effusion. In addition confluent density posteriorly in the right lung suggests atelectasis or early pneumonia. Followup PA and lateral  chest X-ray is recommended in 3-4 weeks following trial of antibiotic therapy to ensure resolution and exclude underlying malignancy. Thoracic aortic atherosclerosis. Electronically Signed   By: David  Martinique M.D.   On: 04/09/2016 13:46    Procedures Procedures   Medications Ordered in ED Medications - No data to display  Initial Impression / Assessment and Plan / ED Course  I have reviewed the triage vital signs and the nursing notes.  Pertinent labs & imaging results that were available during my care of the patient were reviewed by me and considered in my medical decision making (see chart for details).     75yM with progressive dyspnea. Significant weight gain. He was short of breath just getting him sitting up on the stretcher. Admit for significant volume overload beyond what I feel can adequately be addressed as an outpt.   Final Clinical Impressions(s) / ED Diagnoses   Final diagnoses:  Shortness of breath  Hypervolemia, unspecified hypervolemia type   New Prescriptions New Prescriptions   No medications on file   I personally preformed the services scribed in my presence. The recorded information has been reviewed is accurate. Omar Manifold, MD.     Omar Manifold, MD 04/24/16 [phone]

## 2016-04-09 NOTE — ED Notes (Signed)
Pt CBG was 45 nurse notified

## 2016-04-09 NOTE — ED Triage Notes (Signed)
Pt in from PCP office, pt seen PCP today for SOB & wt. Gain, pt denies CP, pt denies n/v/d, PCP office callled pts cardiologist Dr. Claiborne Billings who told pt to come here, pt denies weighing himself, pt has distended abd, A&O x4

## 2016-04-09 NOTE — ED Notes (Signed)
NT informed this RN regarding present CBG. Instructed NT to give apple juice, peanut butter and/or a sandwich. Pt has finished eating most of his dinner tray.

## 2016-04-10 DIAGNOSIS — N183 Chronic kidney disease, stage 3 (moderate): Secondary | ICD-10-CM | POA: Diagnosis present

## 2016-04-10 DIAGNOSIS — Z825 Family history of asthma and other chronic lower respiratory diseases: Secondary | ICD-10-CM | POA: Diagnosis not present

## 2016-04-10 DIAGNOSIS — E1122 Type 2 diabetes mellitus with diabetic chronic kidney disease: Secondary | ICD-10-CM | POA: Diagnosis present

## 2016-04-10 DIAGNOSIS — J189 Pneumonia, unspecified organism: Secondary | ICD-10-CM | POA: Diagnosis present

## 2016-04-10 DIAGNOSIS — Z7984 Long term (current) use of oral hypoglycemic drugs: Secondary | ICD-10-CM | POA: Diagnosis not present

## 2016-04-10 DIAGNOSIS — I440 Atrioventricular block, first degree: Secondary | ICD-10-CM | POA: Diagnosis present

## 2016-04-10 DIAGNOSIS — E8770 Fluid overload, unspecified: Secondary | ICD-10-CM | POA: Diagnosis present

## 2016-04-10 DIAGNOSIS — Z79899 Other long term (current) drug therapy: Secondary | ICD-10-CM | POA: Diagnosis not present

## 2016-04-10 DIAGNOSIS — Z7901 Long term (current) use of anticoagulants: Secondary | ICD-10-CM | POA: Diagnosis not present

## 2016-04-10 DIAGNOSIS — Z833 Family history of diabetes mellitus: Secondary | ICD-10-CM | POA: Diagnosis not present

## 2016-04-10 DIAGNOSIS — I483 Typical atrial flutter: Secondary | ICD-10-CM

## 2016-04-10 DIAGNOSIS — J440 Chronic obstructive pulmonary disease with acute lower respiratory infection: Secondary | ICD-10-CM | POA: Diagnosis present

## 2016-04-10 DIAGNOSIS — I10 Essential (primary) hypertension: Secondary | ICD-10-CM | POA: Diagnosis not present

## 2016-04-10 DIAGNOSIS — E039 Hypothyroidism, unspecified: Secondary | ICD-10-CM

## 2016-04-10 DIAGNOSIS — G4733 Obstructive sleep apnea (adult) (pediatric): Secondary | ICD-10-CM | POA: Diagnosis present

## 2016-04-10 DIAGNOSIS — M1A9XX0 Chronic gout, unspecified, without tophus (tophi): Secondary | ICD-10-CM | POA: Diagnosis present

## 2016-04-10 DIAGNOSIS — I4891 Unspecified atrial fibrillation: Secondary | ICD-10-CM | POA: Diagnosis present

## 2016-04-10 DIAGNOSIS — I5031 Acute diastolic (congestive) heart failure: Secondary | ICD-10-CM | POA: Diagnosis present

## 2016-04-10 DIAGNOSIS — I130 Hypertensive heart and chronic kidney disease with heart failure and stage 1 through stage 4 chronic kidney disease, or unspecified chronic kidney disease: Secondary | ICD-10-CM | POA: Diagnosis present

## 2016-04-10 DIAGNOSIS — R0902 Hypoxemia: Secondary | ICD-10-CM | POA: Diagnosis present

## 2016-04-10 DIAGNOSIS — L97319 Non-pressure chronic ulcer of right ankle with unspecified severity: Secondary | ICD-10-CM | POA: Diagnosis present

## 2016-04-10 DIAGNOSIS — I4892 Unspecified atrial flutter: Secondary | ICD-10-CM | POA: Diagnosis present

## 2016-04-10 DIAGNOSIS — Z87891 Personal history of nicotine dependence: Secondary | ICD-10-CM | POA: Diagnosis not present

## 2016-04-10 DIAGNOSIS — Z7951 Long term (current) use of inhaled steroids: Secondary | ICD-10-CM | POA: Diagnosis not present

## 2016-04-10 DIAGNOSIS — E785 Hyperlipidemia, unspecified: Secondary | ICD-10-CM | POA: Diagnosis present

## 2016-04-10 DIAGNOSIS — R0602 Shortness of breath: Secondary | ICD-10-CM | POA: Diagnosis present

## 2016-04-10 DIAGNOSIS — R0609 Other forms of dyspnea: Secondary | ICD-10-CM | POA: Diagnosis not present

## 2016-04-10 DIAGNOSIS — E11622 Type 2 diabetes mellitus with other skin ulcer: Secondary | ICD-10-CM | POA: Diagnosis present

## 2016-04-10 DIAGNOSIS — E119 Type 2 diabetes mellitus without complications: Secondary | ICD-10-CM | POA: Diagnosis not present

## 2016-04-10 LAB — CBC
HEMATOCRIT: 38.6 % — AB (ref 39.0–52.0)
Hemoglobin: 11.8 g/dL — ABNORMAL LOW (ref 13.0–17.0)
MCH: 30.1 pg (ref 26.0–34.0)
MCHC: 30.6 g/dL (ref 30.0–36.0)
MCV: 98.5 fL (ref 78.0–100.0)
Platelets: 164 10*3/uL (ref 150–400)
RBC: 3.92 MIL/uL — AB (ref 4.22–5.81)
RDW: 17.2 % — ABNORMAL HIGH (ref 11.5–15.5)
WBC: 4.5 10*3/uL (ref 4.0–10.5)

## 2016-04-10 LAB — BASIC METABOLIC PANEL
ANION GAP: 9 (ref 5–15)
BUN: 35 mg/dL — AB (ref 6–20)
CHLORIDE: 99 mmol/L — AB (ref 101–111)
CO2: 33 mmol/L — ABNORMAL HIGH (ref 22–32)
Calcium: 9.3 mg/dL (ref 8.9–10.3)
Creatinine, Ser: 2.21 mg/dL — ABNORMAL HIGH (ref 0.61–1.24)
GFR, EST AFRICAN AMERICAN: 32 mL/min — AB (ref >60)
GFR, EST NON AFRICAN AMERICAN: 27 mL/min — AB (ref >60)
Glucose, Bld: 88 mg/dL (ref 65–99)
POTASSIUM: 4.9 mmol/L (ref 3.5–5.1)
SODIUM: 141 mmol/L (ref 135–145)

## 2016-04-10 LAB — GLUCOSE, CAPILLARY
Glucose-Capillary: 93 mg/dL (ref 65–99)
Glucose-Capillary: 79 mg/dL (ref 65–99)
Glucose-Capillary: 86 mg/dL (ref 65–99)
Glucose-Capillary: 83 mg/dL (ref 65–99)

## 2016-04-10 MED ORDER — LEVOFLOXACIN 500 MG PO TABS
500.0000 mg | ORAL_TABLET | ORAL | Status: DC
  Administered 2016-04-11: 500 mg via ORAL
  Filled 2016-04-10: qty 1

## 2016-04-10 NOTE — Evaluation (Signed)
Physical Therapy Evaluation/Discharge Patient Details Name: Omar Lambert MRN: [id number] DOB: [birth year] Today's Date: 04/10/2016   History of Present Illness  76 y.o.malewho presents with DOE and wt gain likely from CHF exacerbation and possible CAP. PMH: diabetes, gout, COPD, CHF.  Clinical Impression  Patient evaluated by Physical Therapy with no further acute PT needs identified. All education has been completed and the patient has no further questions. Pt able to ambulate 160 ft without an assistive device. SpO2 95% prior to ambulation and 87% upon return with return to 90% with focus on breathing. Pt reports feeling like he is doing much better and almost back to his baseline. See below for any follow-up Physical Therapy or equipment needs. PT is signing off. Thank you for this referral.     Follow Up Recommendations No PT follow up    Equipment Recommendations  None recommended by PT    Recommendations for Other Services       Precautions / Restrictions Precautions Precautions: None Restrictions Weight Bearing Restrictions: No      Mobility  Bed Mobility               General bed mobility comments: pt sitting EOB upon arrival  Transfers Overall transfer level: Independent Equipment used: None             General transfer comment: Good stability, no loss of balance  Ambulation/Gait Ambulation/Gait assistance: Independent Ambulation Distance (Feet): 160 Feet Assistive device: None Gait Pattern/deviations: Step-through pattern Gait velocity: WFL   General Gait Details: good stability, no loss of balance  Stairs            Wheelchair Mobility    Modified Rankin (Stroke Patients Only)       Balance Overall balance assessment: No apparent balance deficits (not formally assessed)                                           Pertinent Vitals/Pain Pain Assessment: No/denies pain    Home Living Family/patient expects to  be discharged to:: Private residence Living Arrangements: Spouse/significant other Available Help at Discharge: Family;Available 24 hours/day Type of Home: House Home Access: Level entry     Home Layout: One level Home Equipment: None      Prior Function Level of Independence: Independent               Hand Dominance        Extremity/Trunk Assessment   Upper Extremity Assessment Upper Extremity Assessment: Overall WFL for tasks assessed    Lower Extremity Assessment Lower Extremity Assessment: Overall WFL for tasks assessed    Cervical / Trunk Assessment Cervical / Trunk Assessment: Normal  Communication   Communication: No difficulties  Cognition Arousal/Alertness: Awake/alert Behavior During Therapy: WFL for tasks assessed/performed Overall Cognitive Status: Within Functional Limits for tasks assessed                      General Comments      Exercises     Assessment/Plan    PT Assessment Patent does not need any further PT services  PT Problem List         PT Treatment Interventions      PT Goals (Current goals can be found in the Care Plan section)  Acute Rehab PT Goals Patient Stated Goal: go home PT Goal Formulation: With patient Time For  Goal Achievement: 04/10/16 Potential to Achieve Goals: Good    Frequency     Barriers to discharge        Co-evaluation               End of Session   Activity Tolerance: Patient tolerated treatment well Patient left: with call bell/phone within reach (sitting EOB) Nurse Communication: Mobility status PT Visit Diagnosis: Unsteadiness on feet (R26.81)    Functional Assessment Tool Used: AM-PAC 6 Clicks Basic Mobility;Clinical judgement Functional Limitation: Mobility: Walking and moving around Mobility: Walking and Moving Around Current Status (N8177): At least 1 percent but less than 20 percent impaired, limited or restricted Mobility: Walking and Moving Around Goal Status  [phone]): At least 1 percent but less than 20 percent impaired, limited or restricted Mobility: Walking and Moving Around Discharge Status [phone]): At least 1 percent but less than 20 percent impaired, limited or restricted    Time: 1445-1458 PT Time Calculation (min) (ACUTE ONLY): 13 min   Charges:   PT Evaluation $PT Eval Low Complexity: 1 Procedure     PT G Codes:   PT G-Codes **NOT FOR INPATIENT CLASS** Functional Assessment Tool Used: AM-PAC 6 Clicks Basic Mobility;Clinical judgement Functional Limitation: Mobility: Walking and moving around Mobility: Walking and Moving Around Current Status (F3832): At least 1 percent but less than 20 percent impaired, limited or restricted Mobility: Walking and Moving Around Goal Status [phone]): At least 1 percent but less than 20 percent impaired, limited or restricted Mobility: Walking and Moving Around Discharge Status [phone]): At least 1 percent but less than 20 percent impaired, limited or restricted     Cassell Clement, PT, [place] Pager [phone] Office [phone]  04/10/2016, 3:12 PM

## 2016-04-10 NOTE — Progress Notes (Addendum)
PROGRESS NOTE    Omar Lambert  SPQ:[id number] DOB: 12-17-1940 DOA: 04/09/2016 PCP: [name], DO    Brief Narrative:  Patient is a pleasant 76 year old gentleman history of diabetes, hypertension, hypothyroidism, atrial flutter on chronic anticoagulation, chronic kidney disease presented with worsening shortness of breath, weight gain likely secondary to CHF exacerbation and possible community-acquired pneumonia.   Assessment & Plan:   Active Problems:   Benign essential hypertension   Diabetes mellitus type II, controlled ([place])   Chronic kidney disease (CKD) stage G3b/A2, moderately decreased glomerular filtration rate (GFR) between 30-44 mL/min/1.73 square meter and albuminuria creatinine ratio between 30-299 mg/g   Atrial flutter (HCC)   Acute diastolic CHF (congestive heart failure) (HCC)   Hypothyroidism   Dyspnea on exertion   CAP (community acquired pneumonia)  #1 acute diastolic heart failure Patient presenting with worsening shortness of breath, orthopnea.point-of-care troponin negative. BNP at 460.5. I/O's -4.12 L during this hospitalization. Patient with a urine output of 3 L over the past 24 hours. Patient still with lower extremity edema. Continue Lasix 40 mg IV every 12 hours. Strict I's and O's. Daily weights.  #2 presumed community-acquired pneumonia Patient is currently afebrile. Clinical improvement. Continue empiric Levaquin.  #3 history of atrial flutter Continue amiodarone. Eliquis for anticoagulation.  #4 hypothyroidism TSH is 5.71. Continue home dose Synthroid. Likely need repeat thyroid function studies done in about 4-6 weeks.  #5 hyperlipidemia Continue Pravachol.  #6 chronic kidney disease stage III Stable. Monitor closely with diuresis.  #7 diabetes mellitus type 2 Continue to hold oral hypoglycemic medications. Sliding scale insulin.  #8 hypertension Stable. Continue current regimen.  #9   DVT prophylaxis: eliquis Code Status:  Full Family Communication: Updated patient and family at bedside. Disposition Plan: Home when clinically improved and back to baseline and transition to oral diuretics. With improvement with hypoxia.   Consultants:   None  Procedures:   Chest x-ray 04/09/2016    Antimicrobials:  None   Subjective: Feeling better. States shortness of breath has improved. No chest pain.  Objective: Vitals:   04/09/16 2124 04/09/16 2130 04/09/16 2228 04/10/16 0707  BP: 133/63 (!) 130/52 (!) 151/68 128/71  Pulse: 70 72 66 67  Resp: 24 24 18 18   Temp:   97.9 F (36.6 C) 98.3 F (36.8 C)  TempSrc:   Oral Oral  SpO2: 100% 100% 100% 100%  Weight:   111.7 kg (246 lb 4.8 oz) 111.6 kg (246 lb 1.6 oz)  Height:   5\' 8"  (1.727 m)     Intake/Output Summary (Last 24 hours) at 04/10/16 1818 Last data filed at 04/10/16 1712  Gross per 24 hour  Intake              580 ml  Output             3250 ml  Net            -2670 ml   Filed Weights   04/09/16 1229 04/09/16 2228 04/10/16 0707  Weight: 116.3 kg (256 lb 8 oz) 111.7 kg (246 lb 4.8 oz) 111.6 kg (246 lb 1.6 oz)    Examination:  General exam: Appears calm and comfortable  Respiratory system: Bibasilar crakles R> L. No wheezing. Respiratory effort normal. Cardiovascular system: S1 & S2 heard, RRR. No JVD, murmurs, rubs, gallops or clicks. 1-2+ bilateral lower extremity edema. Gastrointestinal system: Abdomen is nondistended, soft and nontender. No organomegaly or masses felt. Normal bowel sounds heard. Central nervous system: Alert and oriented. No  focal neurological deficits. Extremities: Symmetric 5 x 5 power. Skin: No rashes, lesions or ulcers Psychiatry: Judgement and insight appear normal. Mood & affect appropriate.     Data Reviewed: I have personally reviewed following labs and imaging studies  CBC:  Recent Labs Lab 04/09/16 1227 04/10/16 0456  WBC 5.4 4.5  HGB 12.0* 11.8*  HCT 40.0 38.6*  MCV 100.0 98.5  PLT 147* 250    Basic Metabolic Panel:  Recent Labs Lab 04/09/16 1227 04/10/16 0456  NA 140 141  K 5.5* 4.9  CL 104 99*  CO2 29 33*  GLUCOSE 92 88  BUN 29* 35*  CREATININE 2.24* 2.21*  CALCIUM 9.1 9.3   GFR: Estimated Creatinine Clearance: 34.5 mL/min (by C-G formula based on SCr of 2.21 mg/dL (H)). Liver Function Tests: No results for input(s): AST, ALT, ALKPHOS, BILITOT, PROT, ALBUMIN in the last 168 hours. No results for input(s): LIPASE, AMYLASE in the last 168 hours. No results for input(s): AMMONIA in the last 168 hours. Coagulation Profile: No results for input(s): INR, PROTIME in the last 168 hours. Cardiac Enzymes: No results for input(s): CKTOTAL, CKMB, CKMBINDEX, TROPONINI in the last 168 hours. BNP (last 3 results) No results for input(s): PROBNP in the last 8760 hours. HbA1C: No results for input(s): HGBA1C in the last 72 hours. CBG:  Recent Labs Lab 04/09/16 2049 04/09/16 2227 04/10/16 0759 04/10/16 1221 04/10/16 1616  GLUCAP 78 89 93 79 86   Lipid Profile: No results for input(s): CHOL, HDL, LDLCALC, TRIG, CHOLHDL, LDLDIRECT in the last 72 hours. Thyroid Function Tests: No results for input(s): TSH, T4TOTAL, FREET4, T3FREE, THYROIDAB in the last 72 hours. Anemia Panel: No results for input(s): VITAMINB12, FOLATE, FERRITIN, TIBC, IRON, RETICCTPCT in the last 72 hours. Sepsis Labs:  Recent Labs Lab 04/09/16 2002  PROCALCITON 0.25    Recent Results (from the past 240 hour(s))  Culture, blood (routine x 2) Call MD if unable to obtain prior to antibiotics being given     Status: None (Preliminary result)   Collection Time: 04/09/16  7:50 PM  Result Value Ref Range Status   Specimen Description BLOOD RIGHT ARM  Final   Special Requests IN PEDIATRIC BOTTLE 4CC  Final   Culture NO GROWTH < 24 HOURS  Final   Report Status PENDING  Incomplete  Culture, blood (routine x 2) Call MD if unable to obtain prior to antibiotics being given     Status: None (Preliminary  result)   Collection Time: 04/09/16  8:00 PM  Result Value Ref Range Status   Specimen Description BLOOD RIGHT HAND  Final   Special Requests AEROBIC BOTTLE ONLY  3CC  Final   Culture NO GROWTH < 24 HOURS  Final   Report Status PENDING  Incomplete         Radiology Studies: Dg Chest 2 View  Result Date: 04/09/2016 CLINICAL DATA:  Two weeks of shortness of breath and peripheral edema and chest pressure. History of atrial flutter, COPD, hypertension, obesity. Former smoker. EXAM: CHEST  2 VIEW COMPARISON:  PA and lateral chest x-ray of October 28, 2015 FINDINGS: The lungs are well-expanded. There is a new small right pleural effusion however. There is hazy increased density posteriorly in the right lower lobe. The cardiac silhouette is enlarged. The pulmonary vascularity is engorged and exhibits mild cephalization. There is calcification in the wall of the aortic arch. The bony thorax exhibits no acute abnormality. IMPRESSION: CHF with mild interstitial edema and new small right pleural effusion.  In addition confluent density posteriorly in the right lung suggests atelectasis or early pneumonia. Followup PA and lateral chest X-ray is recommended in 3-4 weeks following trial of antibiotic therapy to ensure resolution and exclude underlying malignancy. Thoracic aortic atherosclerosis. Electronically Signed   By: David  Martinique M.D.   On: 04/09/2016 13:46        Scheduled Meds: . allopurinol  100 mg Oral Daily  . amiodarone  200 mg Oral BID  . apixaban  5 mg Oral BID  . fluticasone furoate-vilanterol  1 puff Inhalation Daily  . furosemide  40 mg Intravenous BID  . insulin aspart  0-9 Units Subcutaneous TID WC  . [START ON 04/11/2016] levofloxacin  500 mg Oral Q48H  . levothyroxine  150 mcg Oral QAC breakfast  . montelukast  10 mg Oral QHS  . pravastatin  20 mg Oral Daily   Continuous Infusions:   LOS: 0 days    Time spent: 76 mins    THOMPSON,DANIEL, MD [facility] Pager  [phone]  If 7PM-7AM, please contact night-coverage [email or web address] Password [email or web address] 04/10/2016, 6:18 PM

## 2016-04-10 NOTE — Care Management Note (Signed)
Case Management Note  Patient Details  Name: Omar Lambert MRN: [id number] Date of Birth: [birth year]  Subjective/Objective: Admitted with CHF/ Pneumonia               Action/Plan: Patient lives at home with his spouse; PCP: [name], DO; has private insurance with [facility] Medicare with prescription drug coverage; awaiting on physical therapy eval for disposition needs; CM will continue to follow for DCP  Expected Discharge Date:  04/13/16               Expected Discharge Plan: possibly [place]  Discharge planning Services  CM Consult   Status of Service:  In process, will continue to follow  Sherrilyn Rist [phone] 04/10/2016, 12:47 PM

## 2016-04-11 DIAGNOSIS — E1122 Type 2 diabetes mellitus with diabetic chronic kidney disease: Secondary | ICD-10-CM

## 2016-04-11 DIAGNOSIS — I4892 Unspecified atrial flutter: Secondary | ICD-10-CM

## 2016-04-11 DIAGNOSIS — R0609 Other forms of dyspnea: Secondary | ICD-10-CM

## 2016-04-11 LAB — GLUCOSE, CAPILLARY
GLUCOSE-CAPILLARY: 82 mg/dL (ref 65–99)
GLUCOSE-CAPILLARY: 72 mg/dL (ref 65–99)
Glucose-Capillary: 88 mg/dL (ref 65–99)

## 2016-04-11 LAB — BASIC METABOLIC PANEL
Anion gap: 9 (ref 5–15)
BUN: 38 mg/dL — ABNORMAL HIGH (ref 6–20)
CHLORIDE: 96 mmol/L — AB (ref 101–111)
CO2: 32 mmol/L (ref 22–32)
Calcium: 9 mg/dL (ref 8.9–10.3)
Creatinine, Ser: 2.22 mg/dL — ABNORMAL HIGH (ref 0.61–1.24)
GFR calc non Af Amer: 27 mL/min — ABNORMAL LOW (ref >60)
GFR, EST AFRICAN AMERICAN: 31 mL/min — AB (ref >60)
Glucose, Bld: 85 mg/dL (ref 65–99)
Potassium: 4.4 mmol/L (ref 3.5–5.1)
Sodium: 137 mmol/L (ref 135–145)

## 2016-04-11 LAB — PROCALCITONIN: Procalcitonin: 0.26 ng/mL

## 2016-04-11 LAB — CBC
HEMATOCRIT: 37.9 % — AB (ref 39.0–52.0)
Hemoglobin: 11.7 g/dL — ABNORMAL LOW (ref 13.0–17.0)
MCH: 30 pg (ref 26.0–34.0)
MCHC: 30.9 g/dL (ref 30.0–36.0)
MCV: 97.2 fL (ref 78.0–100.0)
PLATELETS: 164 10*3/uL (ref 150–400)
RBC: 3.9 MIL/uL — AB (ref 4.22–5.81)
RDW: 16.6 % — ABNORMAL HIGH (ref 11.5–15.5)
WBC: 4.3 10*3/uL (ref 4.0–10.5)

## 2016-04-11 MED ORDER — TORSEMIDE 20 MG PO TABS
20.0000 mg | ORAL_TABLET | Freq: Two times a day (BID) | ORAL | Status: DC
  Administered 2016-04-12: 20 mg via ORAL
  Filled 2016-04-11: qty 1

## 2016-04-11 NOTE — Progress Notes (Signed)
SATURATION QUALIFICATIONS: (This note is used to comply with regulatory documentation for home oxygen)  Patient Saturations on Room Air at Rest = 91%  Patient Saturations on Room Air while Ambulating = 94%  Patient Saturations on Liters of oxygen while Ambulating = n/a  Please briefly explain why patient needs home oxygen: n/a

## 2016-04-11 NOTE — Progress Notes (Signed)
Patient rested well overnight. No reports of pain. VSS. Patient sitting up in bed. No complaints at this time.

## 2016-04-11 NOTE — Progress Notes (Signed)
PROGRESS NOTE    Omar Lambert  HDQ:[id number] DOB: 05-04-1940 DOA: 04/09/2016 PCP: [name], DO    Brief Narrative:  Patient is a pleasant 76 year old gentleman history of diabetes, hypertension, hypothyroidism, atrial flutter on chronic anticoagulation, chronic kidney disease presented with worsening shortness of breath, weight gain likely secondary to CHF exacerbation and possible community-acquired pneumonia.   Assessment & Plan:   Active Problems:   Benign essential hypertension   Diabetes mellitus type II, controlled ([place])   Chronic kidney disease (CKD) stage G3b/A2, moderately decreased glomerular filtration rate (GFR) between 30-44 mL/min/1.73 square meter and albuminuria creatinine ratio between 30-299 mg/g   Atrial flutter (HCC)   Acute diastolic CHF (congestive heart failure) (HCC)   Hypothyroidism   Dyspnea on exertion   CAP (community acquired pneumonia)   DOE (dyspnea on exertion)  #1 acute diastolic heart failure Patient presenting with worsening shortness of breath, orthopnea.point-of-care troponin negative. BNP at 460.5. I/O's - 6.5 L during this hospitalization. Patient with a urine output of 3. 4-5 L over the past 24 hours. Patient still with lower extremity edema and some bibasilar crackles. Continue Lasix 40 mg IV every 12 hours and transition back to prior dose of Demadex at 20 mg twice a day tomorrow. Follow creatinine. Patient's creatinine seems to have stabilized around 2. 25 and maybe patient's new baseline. Strict I's and O's. Daily weights.  #2 presumed community-acquired pneumonia Patient is currently afebrile. Clinical improvement. Continue empiric Levaquin D3/7.  #3 history of atrial flutter Continue amiodarone. Eliquis for anticoagulation.  #4 hypothyroidism TSH is 5.71. Continue home dose Synthroid. Likely need repeat thyroid function studies done in about 4-6 weeks.  #5 hyperlipidemia Continue Pravachol.  #6 chronic kidney disease stage  III Stable. Creatinine has been stable around 2.25 with diureses with significant clinical improvement which might be patient's new baseline. Patient's prior baseline was 1.6-1.8. Due to patient's presentation with acute on chronic CHF exacerbation after Demadex dose was cut in half may need to live with slight increase in patient's creatinine and monitor closely.   #7 diabetes mellitus type 2 Continue to hold oral hypoglycemic medications. CBGs have ranged from 82 -88. Sliding scale insulin.  #8 hypertension Stable. Continue current regimen.  #9   DVT prophylaxis: eliquis Code Status: Full Family Communication: Updated patient and family at bedside. Disposition Plan: Home when clinically improved and back to baseline and transition to oral diuretics. With improvement with hypoxia, Hopefully tomorrow.    Consultants:   None  Procedures:   Chest x-ray 04/09/2016    Antimicrobials:  Levaquin 04/09/2016   Subjective: Feeling better. States shortness of breath has improved. No chest pain. Patient hoping to be discharged home today.  Objective: Vitals:   04/10/16 2035 04/11/16 0015 04/11/16 0451 04/11/16 0938  BP: (!) 141/68 129/65 126/67   Pulse: 66 64 65 66  Resp: 16 18 16 16   Temp: 97.3 F (36.3 C) 98.1 F (36.7 C) 98.3 F (36.8 C)   TempSrc: Oral Oral Oral   SpO2: 94% 95% 93% 91%  Weight:   110.4 kg (243 lb 6.4 oz)   Height:        Intake/Output Summary (Last 24 hours) at 04/11/16 1147 Last data filed at 04/11/16 0233  Gross per 24 hour  Intake              240 ml  Output             2925 ml  Net            -  2685 ml   Filed Weights   04/09/16 2228 04/10/16 0707 04/11/16 0451  Weight: 111.7 kg (246 lb 4.8 oz) 111.6 kg (246 lb 1.6 oz) 110.4 kg (243 lb 6.4 oz)    Examination:  General exam: Appears calm and comfortable  Respiratory system: Bibasilar crakles R> L. No wheezing. Respiratory effort normal. Cardiovascular system: S1 & S2 heard, RRR. No JVD,  murmurs, rubs, gallops or clicks. 1-2+ bilateral lower extremity edema. Gastrointestinal system: Abdomen is nondistended, soft and nontender. No organomegaly or masses felt. Normal bowel sounds heard. Central nervous system: Alert and oriented. No focal neurological deficits. Extremities: Symmetric 5 x 5 power. Skin: No rashes, lesions or ulcers Psychiatry: Judgement and insight appear normal. Mood & affect appropriate.     Data Reviewed: I have personally reviewed following labs and imaging studies  CBC:  Recent Labs Lab 04/09/16 1227 04/10/16 0456 04/11/16 0516  WBC 5.4 4.5 4.3  HGB 12.0* 11.8* 11.7*  HCT 40.0 38.6* 37.9*  MCV 100.0 98.5 97.2  PLT 147* 164 917   Basic Metabolic Panel:  Recent Labs Lab 04/09/16 1227 04/10/16 0456 04/11/16 0516  NA 140 141 137  K 5.5* 4.9 4.4  CL 104 99* 96*  CO2 29 33* 32  GLUCOSE 92 88 85  BUN 29* 35* 38*  CREATININE 2.24* 2.21* 2.22*  CALCIUM 9.1 9.3 9.0   GFR: Estimated Creatinine Clearance: 34.1 mL/min (by C-G formula based on SCr of 2.22 mg/dL (H)). Liver Function Tests: No results for input(s): AST, ALT, ALKPHOS, BILITOT, PROT, ALBUMIN in the last 168 hours. No results for input(s): LIPASE, AMYLASE in the last 168 hours. No results for input(s): AMMONIA in the last 168 hours. Coagulation Profile: No results for input(s): INR, PROTIME in the last 168 hours. Cardiac Enzymes: No results for input(s): CKTOTAL, CKMB, CKMBINDEX, TROPONINI in the last 168 hours. BNP (last 3 results) No results for input(s): PROBNP in the last 8760 hours. HbA1C: No results for input(s): HGBA1C in the last 72 hours. CBG:  Recent Labs Lab 04/10/16 1221 04/10/16 1616 04/10/16 2059 04/11/16 0742 04/11/16 1125  GLUCAP 79 86 83 88 82   Lipid Profile: No results for input(s): CHOL, HDL, LDLCALC, TRIG, CHOLHDL, LDLDIRECT in the last 72 hours. Thyroid Function Tests: No results for input(s): TSH, T4TOTAL, FREET4, T3FREE, THYROIDAB in the last  72 hours. Anemia Panel: No results for input(s): VITAMINB12, FOLATE, FERRITIN, TIBC, IRON, RETICCTPCT in the last 72 hours. Sepsis Labs:  Recent Labs Lab 04/09/16 2002 04/11/16 0516  PROCALCITON 0.25 0.26    Recent Results (from the past 240 hour(s))  Culture, blood (routine x 2) Call MD if unable to obtain prior to antibiotics being given     Status: None (Preliminary result)   Collection Time: 04/09/16  7:50 PM  Result Value Ref Range Status   Specimen Description BLOOD RIGHT ARM  Final   Special Requests IN PEDIATRIC BOTTLE 4CC  Final   Culture NO GROWTH < 24 HOURS  Final   Report Status PENDING  Incomplete  Culture, blood (routine x 2) Call MD if unable to obtain prior to antibiotics being given     Status: None (Preliminary result)   Collection Time: 04/09/16  8:00 PM  Result Value Ref Range Status   Specimen Description BLOOD RIGHT HAND  Final   Special Requests AEROBIC BOTTLE ONLY  3CC  Final   Culture NO GROWTH < 24 HOURS  Final   Report Status PENDING  Incomplete  Radiology Studies: Dg Chest 2 View  Result Date: 04/09/2016 CLINICAL DATA:  Two weeks of shortness of breath and peripheral edema and chest pressure. History of atrial flutter, COPD, hypertension, obesity. Former smoker. EXAM: CHEST  2 VIEW COMPARISON:  PA and lateral chest x-ray of October 28, 2015 FINDINGS: The lungs are well-expanded. There is a new small right pleural effusion however. There is hazy increased density posteriorly in the right lower lobe. The cardiac silhouette is enlarged. The pulmonary vascularity is engorged and exhibits mild cephalization. There is calcification in the wall of the aortic arch. The bony thorax exhibits no acute abnormality. IMPRESSION: CHF with mild interstitial edema and new small right pleural effusion. In addition confluent density posteriorly in the right lung suggests atelectasis or early pneumonia. Followup PA and lateral chest X-ray is recommended in 3-4  weeks following trial of antibiotic therapy to ensure resolution and exclude underlying malignancy. Thoracic aortic atherosclerosis. Electronically Signed   By: David  Martinique M.D.   On: 04/09/2016 13:46        Scheduled Meds: . allopurinol  100 mg Oral Daily  . amiodarone  200 mg Oral BID  . apixaban  5 mg Oral BID  . fluticasone furoate-vilanterol  1 puff Inhalation Daily  . furosemide  40 mg Intravenous BID  . insulin aspart  0-9 Units Subcutaneous TID WC  . levofloxacin  500 mg Oral Q48H  . levothyroxine  150 mcg Oral QAC breakfast  . montelukast  10 mg Oral QHS  . pravastatin  20 mg Oral Daily   Continuous Infusions:   LOS: 1 day    Time spent: 76 mins    Alycia Cooperwood, MD [facility] Pager [phone]  If 7PM-7AM, please contact night-coverage [email or web address] Password [email or web address] 04/11/2016, 11:47 AM

## 2016-04-11 NOTE — Discharge Instructions (Signed)
Information on my medicine - ELIQUIS (apixaban)  You were taking this medication prior to this hospitalization. Why was Eliquis prescribed for you? Eliquis was prescribed for you to reduce the risk of a blood clot forming that can cause a stroke if you have a medical condition called atrial fibrillation (a type of irregular heartbeat).  What do You need to know about Eliquis ? Take your Eliquis TWICE DAILY - one tablet in the morning and one tablet in the evening with or without food. If you have difficulty swallowing the tablet whole please discuss with your pharmacist how to take the medication safely.  Take Eliquis exactly as prescribed by your doctor and DO NOT stop taking Eliquis without talking to the doctor who prescribed the medication.  Stopping may increase your risk of developing a stroke.  Refill your prescription before you run out.  After discharge, you should have regular check-up appointments with your healthcare provider that is prescribing your Eliquis.  In the future your dose may need to be changed if your kidney function or weight changes by a significant amount or as you get older.  What do you do if you miss a dose? If you miss a dose, take it as soon as you remember on the same day and resume taking twice daily.  Do not take more than one dose of ELIQUIS at the same time to make up a missed dose.  Important Safety Information A possible side effect of Eliquis is bleeding. You should call your healthcare provider right away if you experience any of the following: ? Bleeding from an injury or your nose that does not stop. ? Unusual colored urine (red or dark brown) or unusual colored stools (red or black). ? Unusual bruising for unknown reasons. ? A serious fall or if you hit your head (even if there is no bleeding).  Some medicines may interact with Eliquis and might increase your risk of bleeding or clotting while on Eliquis. To help avoid this, consult your  healthcare provider or pharmacist prior to using any new prescription or non-prescription medications, including herbals, vitamins, non-steroidal anti-inflammatory drugs (NSAIDs) and supplements.  This website has more information on Eliquis (apixaban): http://www.eliquis.com/eliquis/home

## 2016-04-12 LAB — BASIC METABOLIC PANEL
ANION GAP: 8 (ref 5–15)
BUN: 41 mg/dL — ABNORMAL HIGH (ref 6–20)
CO2: 33 mmol/L — AB (ref 22–32)
Calcium: 8.9 mg/dL (ref 8.9–10.3)
Chloride: 96 mmol/L — ABNORMAL LOW (ref 101–111)
Creatinine, Ser: 2.1 mg/dL — ABNORMAL HIGH (ref 0.61–1.24)
GFR calc non Af Amer: 29 mL/min — ABNORMAL LOW (ref >60)
GFR, EST AFRICAN AMERICAN: 34 mL/min — AB (ref >60)
Glucose, Bld: 76 mg/dL (ref 65–99)
POTASSIUM: 4.5 mmol/L (ref 3.5–5.1)
Sodium: 137 mmol/L (ref 135–145)

## 2016-04-12 LAB — GLUCOSE, CAPILLARY
GLUCOSE-CAPILLARY: 86 mg/dL (ref 65–99)
GLUCOSE-CAPILLARY: 91 mg/dL (ref 65–99)

## 2016-04-12 MED ORDER — LEVOFLOXACIN 500 MG PO TABS: 500.0000 mg | ORAL_TABLET | ORAL | 0 refills | Status: AC

## 2016-04-12 MED ORDER — TORSEMIDE 20 MG PO TABS: 20.0000 mg | ORAL_TABLET | Freq: Two times a day (BID) | ORAL | 0 refills | Status: DC

## 2016-04-12 MED ORDER — FUROSEMIDE 10 MG/ML IJ SOLN
40.0000 mg | Freq: Once | INTRAMUSCULAR | Status: AC
  Administered 2016-04-12: 40 mg via INTRAVENOUS
  Filled 2016-04-12: qty 4

## 2016-04-12 NOTE — Progress Notes (Signed)
Pt refused bed alrarm to be set on.  Instructed to call for assistance as needed.  Verbalized understanding.  Karie Kirks, Therapist, sports.

## 2016-04-12 NOTE — Progress Notes (Signed)
Offered Pt a bath, Pt stated he will do it later. Tech gave Pt all bath supplies

## 2016-04-12 NOTE — Discharge Summary (Signed)
Physician Discharge Summary  Omar Lambert TMH:[id number] DOB: 01-Feb-1941 DOA: 04/09/2016  PCP: [name], DO  Admit date: 04/09/2016 Discharge date: 04/12/2016  Time spent: 60 minutes  Recommendations for Outpatient Follow-up:  1. Follow up with [name], cardiology in 1-2 weeks. Patient's Demadex dose was increased back to his original dose of 20 mg twice daily as patient had presented with acute CHF exacerbation. Patient will need a basic metabolic profile done to follow-up on electrolytes and renal function. 2. Follow-up with [name], [name], DO in 1-2 weeks. On follow-up patient will need a basic metabolic profile done to follow-up on electrolytes and renal function. Patient also needs repeat thyroid function studies done in about 4-6 weeks. Patient's pneumonia also need to be reassessed.   Discharge Diagnoses:  Active Problems:   Benign essential hypertension   Diabetes mellitus type II, controlled (HCC)   Chronic kidney disease (CKD) stage G3b/A2, moderately decreased glomerular filtration rate (GFR) between 30-44 mL/min/1.73 square meter and albuminuria creatinine ratio between 30-299 mg/g   Atrial flutter (HCC)   Acute diastolic CHF (congestive heart failure) (HCC)   Hypothyroidism   Dyspnea on exertion   CAP (community acquired pneumonia)   DOE (dyspnea on exertion)   Discharge Condition: Stable and improved  Diet recommendation:  Heart healthy  Filed Weights   04/10/16 0707 04/11/16 0451 04/12/16 0513  Weight: 111.6 kg (246 lb 1.6 oz) 110.4 kg (243 lb 6.4 oz) 107.8 kg (237 lb 9.6 oz)    History of present illness:  Per Dr Ammie Dalton is a 76 y.o. male with medical history significant for a checkup, diabetes mellitus, obstructive sleep apnea on CPAP at night, CKD who presented to the emergency department with complaints of shortness of breath especially with exertion and weight gain. He was seen on the day of admission at the primary orifice with above  complaints of SOB on exertion and weight gain.  The PCP contacted his cardiologist, Dr. Claiborne Billings who recommended patient come to the emergency room for evaluation Patient was seen in the emergency room a few weeks ago with complaints of worsening shortness of breath and feeling like he has fluid in his lungs, he also complained of lower extremity swelling, weight gain and dizziness. Patient also noted that his SOB gets worse with attempts to lay flat His last weight was 241 pounds on 03/29/2016 and on day of admission his weight was 256 ponds The dose of his Lasix was recently reduced in half d/t worsening of renal function -  creatinine went up to 2.19 from 1.97 (baseline creatinine 1.60-1.80)  He denied fever, or any other associated symptoms.   ED Course: On arrival to the emergency department his vital signs were stable except intermittent episodes of tachypnea. Blood work demonstrated hemoglobin of 12.0 hematocrit 40% normal white blood cells count, creatinine 2.24 and BUN 29, potassium was mildly elevated at 5.5 He was elevated to 460.5 which seemed to be chronically elevated and troponin was 0.03  Chest x-ray demonstrated CHF with mild interstitial edema and new small right pleural effusion. There was a confluent density in the posterior right lung suggestive of atelectasis versus pneumonia and trial of antibiotic therapy was recommended with follow-up chest x-ray in 3-4 weeks to ensure resolution of this density and exclude underlying malignancy  Hospital Course:  #1 acute diastolic heart failure Patient presenting with worsening shortness of breath, orthopnea and weight gain after his diuretic dose was cut in half due to concerns for worsening renal function.  Patient was admitted..point-of-care troponin negative. BNP at 460.5. Patient was placed on IV Lasix and monitored with good diuresis. Patient diuresed -7.7 L during this hospitalization and his weight on day of discharge was 237 pounds  from 256 pounds on admission. Patient was subsequently transitioned back to his prior dose of Demadex 20 mg twice daily. Patient will follow-up with cardiology in 1-2 weeks. Patient also follow-up with PCP. Patient be discharged in stable and improved condition.   #2 presumed community-acquired pneumonia Patient on admission chest x-ray concerning for community-acquired pneumonia and volume overload. Patient was admitted and placed empirically on IV Levaquin and subsequently transitioned to oral Levaquin. Patient improved clinically. Patient be discharged home on 4 more days of oral Levaquin every 48 hours to complete a course of antibiotic treatment. Outpatient follow-up.   #3 history of atrial flutter Continued on amiodarone. Eliquis for anticoagulation.  #4 hypothyroidism TSH is 5.71. Continued on home dose Synthroid. Likely need repeat thyroid function studies done in about 4-6 weeks.  #5 hyperlipidemia Continued on home regimen of Pravachol.  #6 chronic kidney disease stage III Stable. Creatinine has been stable around 2.25 with diureses with significant clinical improvement which might be patient's new baseline. Patient's prior baseline was 1.6-1.8. Due to patient's presentation with acute on chronic CHF exacerbation after Demadex dose was cut in half may need to live with slight increase in patient's creatinine and monitor closely.   #7 diabetes mellitus type 2 Patient's oral hypoglycemic agents were held and patient maintained on a sliding scale insulin.   #8 hypertension Stable. Continued on home regimen.    Procedures:  CXR 04/09/2016  Consultations:  None  Discharge Exam: Vitals:   04/12/16 0513 04/12/16 0945  BP: 127/70 133/73  Pulse: 65 65  Resp: 18   Temp: 97.7 F (36.5 C)     General: NAD Cardiovascular: RRR Respiratory: Bibaslar crackles. Improved LE edema/ trace-1+BLE edema R>L.  Discharge Instructions   Discharge Instructions    Diet - low  sodium heart healthy    Complete by:  As directed    Increase activity slowly    Complete by:  As directed      Current Discharge Medication List    START taking these medications   Details  levofloxacin (LEVAQUIN) 500 MG tablet Take 1 tablet (500 mg total) by mouth every other day. Qty: 2 tablet, Refills: 0      CONTINUE these medications which have CHANGED   Details  torsemide (DEMADEX) 20 MG tablet Take 1 tablet (20 mg total) by mouth 2 (two) times daily. Qty: 60 tablet, Refills: 0      CONTINUE these medications which have NOT CHANGED   Details  acetaminophen (TYLENOL) 325 MG tablet Take 650 mg by mouth every 6 (six) hours as needed for mild pain.    albuterol (PROVENTIL) (2.5 MG/3ML) 0.083% nebulizer solution INHALE CONTENTS OF 1 VIAL IN NEBULIZER EVERY 4 HOURS AS NEEDED FOR WHEEZING AND ASTHMA Qty: 75 mL, Refills: 1    allopurinol (ZYLOPRIM) 300 MG tablet TAKE 1 TABLET BY MOUTH EVERY DAY Qty: 30 tablet, Refills: 6   Associated Diagnoses: Chronic gout without tophus, unspecified cause, unspecified site    amiodarone (PACERONE) 200 MG tablet TAKE 1 TABLET TWICE A DAY Qty: 60 tablet, Refills: 2    apixaban (ELIQUIS) 5 MG TABS tablet Take 1 tablet (5 mg total) by mouth 2 (two) times daily. Qty: 60 tablet, Refills: 5    fluticasone (FLONASE) 50 MCG/ACT nasal spray Place 1 spray  into both nostrils daily as needed for allergies.     fluticasone furoate-vilanterol (BREO ELLIPTA) 100-25 MCG/INH AEPB Inhale 1 puff into the lungs daily.   Associated Diagnoses: COPD mixed type (HCC)    KLOR-CON M20 20 MEQ tablet TAKE 1 TABLET BY MOUTH EVERY DAY Qty: 30 tablet, Refills: 1   Associated Diagnoses: Low blood potassium    levothyroxine (SYNTHROID, LEVOTHROID) 150 MCG tablet Take 150 mcg by mouth daily before breakfast.    linagliptin (TRADJENTA) 5 MG TABS tablet Take 1 tablet (5 mg total) by mouth daily. Qty: 90 tablet, Refills: 3    montelukast (SINGULAIR) 10 MG tablet Take  one tablet by mouth at bedtime as needed for allergies and asthma Qty: 90 tablet, Refills: 0    Multiple Vitamins-Minerals (CENTRUM SILVER ADULT 50+ PO) Take 1 tablet by mouth daily.    pravastatin (PRAVACHOL) 20 MG tablet TAKE 1 TABLET BY MOUTH EVERY DAY WITH SUPPER Qty: 30 tablet, Refills: 3    silver sulfADIAZINE (SILVADENE) 1 % cream Apply 1 application topically daily. To right ankle wound Qty: 50 g, Refills: 3   Associated Diagnoses: Venous ulcer of ankle, right (HCC)       No Known Allergies Follow-up Information    [name], [name], DO. Schedule an appointment as soon as possible for a visit in 1 week(s).   Specialty:  Geriatric Medicine Why:  F/U IN 1-2 WEEKS. Contact information: [place]. [place] [address] [phone]        [name], MD. Schedule an appointment as soon as possible for a visit in 1 week(s).   Specialty:  Cardiology Why:  F/U IN 1-2 WEEKS. Contact information: [address] [place] [address] [phone]            The results of significant diagnostics from this hospitalization (including imaging, microbiology, ancillary and laboratory) are listed below for reference.    Significant Diagnostic Studies: Dg Chest 2 View  Result Date: 04/09/2016 CLINICAL DATA:  Two weeks of shortness of breath and peripheral edema and chest pressure. History of atrial flutter, COPD, hypertension, obesity. Former smoker. EXAM: CHEST  2 VIEW COMPARISON:  PA and lateral chest x-ray of October 28, 2015 FINDINGS: The lungs are well-expanded. There is a new small right pleural effusion however. There is hazy increased density posteriorly in the right lower lobe. The cardiac silhouette is enlarged. The pulmonary vascularity is engorged and exhibits mild cephalization. There is calcification in the wall of the aortic arch. The bony thorax exhibits no acute abnormality. IMPRESSION: CHF with mild interstitial edema and new small right pleural  effusion. In addition confluent density posteriorly in the right lung suggests atelectasis or early pneumonia. Followup PA and lateral chest X-ray is recommended in 3-4 weeks following trial of antibiotic therapy to ensure resolution and exclude underlying malignancy. Thoracic aortic atherosclerosis. Electronically Signed   By: David  Martinique M.D.   On: 04/09/2016 13:46    Microbiology: Recent Results (from the past 240 hour(s))  Culture, blood (routine x 2) Call MD if unable to obtain prior to antibiotics being given     Status: None (Preliminary result)   Collection Time: 04/09/16  7:50 PM  Result Value Ref Range Status   Specimen Description BLOOD RIGHT ARM  Final   Special Requests IN PEDIATRIC BOTTLE 4CC  Final   Culture NO GROWTH 2 DAYS  Final   Report Status PENDING  Incomplete  Culture, blood (routine x 2) Call MD if unable to obtain  prior to antibiotics being given     Status: None (Preliminary result)   Collection Time: 04/09/16  8:00 PM  Result Value Ref Range Status   Specimen Description BLOOD RIGHT HAND  Final   Special Requests AEROBIC BOTTLE ONLY  3CC  Final   Culture NO GROWTH 2 DAYS  Final   Report Status PENDING  Incomplete     Labs: Basic Metabolic Panel:  Recent Labs Lab 04/09/16 1227 04/10/16 0456 04/11/16 0516 04/12/16 0406  NA 140 141 137 137  K 5.5* 4.9 4.4 4.5  CL 104 99* 96* 96*  CO2 29 33* 32 33*  GLUCOSE 92 88 85 76  BUN 29* 35* 38* 41*  CREATININE 2.24* 2.21* 2.22* 2.10*  CALCIUM 9.1 9.3 9.0 8.9   Liver Function Tests: No results for input(s): AST, ALT, ALKPHOS, BILITOT, PROT, ALBUMIN in the last 168 hours. No results for input(s): LIPASE, AMYLASE in the last 168 hours. No results for input(s): AMMONIA in the last 168 hours. CBC:  Recent Labs Lab 04/09/16 1227 04/10/16 0456 04/11/16 0516  WBC 5.4 4.5 4.3  HGB 12.0* 11.8* 11.7*  HCT 40.0 38.6* 37.9*  MCV 100.0 98.5 97.2  PLT 147* 164 164   Cardiac Enzymes: No results for input(s):  CKTOTAL, CKMB, CKMBINDEX, TROPONINI in the last 168 hours. BNP: BNP (last 3 results)  Recent Labs  08/18/15 0413 10/28/15 0938 04/09/16 1227  BNP 306.6* 393.6* 460.5*    ProBNP (last 3 results) No results for input(s): PROBNP in the last 8760 hours.  CBG:  Recent Labs Lab 04/10/16 2059 04/11/16 0742 04/11/16 1125 04/11/16 1629 04/12/16 0743  GLUCAP 83 88 82 72 86       Signed:  Jowanna Loeffler MD.  [facility] 04/12/2016, 11:20 AM

## 2016-04-13 ENCOUNTER — Telehealth: Payer: Self-pay

## 2016-04-13 NOTE — Telephone Encounter (Signed)
I have made the 1st attempt to contact the patient or family member in charge, in order to follow up from recently being discharged from the hospital. I left a message on voicemail but I will make another attempt at a different time. SG 04/13/16 @ 12:40pm.

## 2016-04-14 LAB — CULTURE, BLOOD (ROUTINE X 2)
Culture: NO GROWTH
Culture: NO GROWTH

## 2016-04-18 ENCOUNTER — Telehealth: Payer: Self-pay

## 2016-04-18 NOTE — Telephone Encounter (Signed)
Transition Care Management Follow-Up Telephone Call   Date discharged and where: [facility] on 04/12/16  How have you been since you were released from the hospital?  Feeling good, breathing with no problems  Any patient concerns? No  Items Reviewed:   Meds: Y  Allergies: Y  Dietary Changes Reviewed: Y  Functional Questionnaire:  Independent-I Dependent-D  ADLs:   Dressing- I    Eating- I   Maintaining continence- I   Transferring- I   Transportation- I   Meal Prep- I   Managing Meds- I  Confirmed importance and Date/Time of follow-up visits scheduled: Y. Follow up with Dr. Mariea Clonts 3/19 @ 8:30am   Confirmed with patient if condition worsens to call PCP or go to the Emergency Dept. Patient was given office number and encouraged to call back with questions or concerns: Yes

## 2016-04-18 NOTE — Telephone Encounter (Signed)
Call made by accident.  SG

## 2016-04-23 ENCOUNTER — Encounter: Payer: Self-pay | Admitting: Internal Medicine

## 2016-04-23 ENCOUNTER — Ambulatory Visit (INDEPENDENT_AMBULATORY_CARE_PROVIDER_SITE_OTHER): Payer: Medicare HMO | Admitting: Internal Medicine

## 2016-04-23 VITALS — BP 138/70 | HR 72 | Temp 97.8°F | Wt 240.0 lb

## 2016-04-23 DIAGNOSIS — N183 Chronic kidney disease, stage 3 (moderate): Secondary | ICD-10-CM

## 2016-04-23 DIAGNOSIS — L97319 Non-pressure chronic ulcer of right ankle with unspecified severity: Secondary | ICD-10-CM

## 2016-04-23 DIAGNOSIS — I5033 Acute on chronic diastolic (congestive) heart failure: Secondary | ICD-10-CM | POA: Diagnosis not present

## 2016-04-23 DIAGNOSIS — N179 Acute kidney failure, unspecified: Secondary | ICD-10-CM | POA: Diagnosis not present

## 2016-04-23 DIAGNOSIS — I10 Essential (primary) hypertension: Secondary | ICD-10-CM | POA: Diagnosis not present

## 2016-04-23 DIAGNOSIS — I83013 Varicose veins of right lower extremity with ulcer of ankle: Secondary | ICD-10-CM | POA: Diagnosis not present

## 2016-04-23 LAB — BASIC METABOLIC PANEL
BUN: 32 mg/dL — AB (ref 7–25)
CO2: 35 mmol/L — ABNORMAL HIGH (ref 20–31)
CREATININE: 1.93 mg/dL — AB (ref 0.70–1.18)
Calcium: 9.1 mg/dL (ref 8.6–10.3)
Chloride: 100 mmol/L (ref 98–110)
Glucose, Bld: 102 mg/dL — ABNORMAL HIGH (ref 65–99)
POTASSIUM: 4.6 mmol/L (ref 3.5–5.3)
Sodium: 142 mmol/L (ref 135–146)

## 2016-04-23 NOTE — Patient Instructions (Signed)
Get your labs with Dr. Claiborne Billings.  We will follow up on everything else next month as planned.   Continue your daily weights, avoiding salt and do not drink more than 2 liters of fluids each day.  Elevate your feet at rest.

## 2016-04-23 NOTE — Progress Notes (Signed)
Location:  [facility] clinic Provider: Jerome Otter L. Mariea Clonts, D.O., C.M.D.  Code Status: DNR Goals of Care:  Advanced Directives 04/10/2016  Does Patient Have a Medical Advance Directive? No  Copy of Healthcare Power of Attorney in Chart? -  Would patient like information on creating a medical advance directive? No - Patient declined   Chief Complaint  Patient presents with  . Transitions Of Care    04/09/2016 - 04/12/2016 (3 days)    HPI: Patient is a 76 y.o. male seen today for hospital follow-up s/p admission from CHF exacerbation and possible community acquired pneumonia.   Was admitted for SOB, weight gain, hypoxia (in office) after his diuretic was reduced due to worsening renal function. BNP 460.5. Upon admission his weight was 256, and reduced to 237 on discharge after IV diuresis. He was transitioned back to his original dose of  Demadex 20 mg BID. Was treated empirically with IV levaquin and transitioned to oral levaquin. Does have history of atrial flutter, which amiodarone and eliquis were continued. He did have some hypoglycemia episodes in hospital, prompting his oral DMs to be held, and maintained SSI. Upon d/c creatinine was 2.10, GFR 34 (Initial: 2.24; GFR 31). He will need recheck of BMP to check electrolytes and renal function.   Today's weight is 240, was 237 upon Hospital d/c on 3/8. Patient reports home scale was 232 this morning. Does report SOB with exertion, but this is pretty much baseline for him. Swelling has greatly improved in legs. Is still have some orthopnea. Denies cough or fever. He does not check his sugars at home, but has not been taking tradjenta.  Today, mainly wants to talk about his right foot. Has an ulcer to his foot. Has been using some cream, but doesn't have anything for pain. Tylenol helps with pain some, but doesn't take it away.   Past Medical History:  Diagnosis Date  . Allergy   . Asthma   . Atrophic gastritis   . Benign essential hypertension   .  CHF (congestive heart failure) ([place])   . COPD (chronic obstructive pulmonary disease) ([place])   . Diabetes ([place])   . GERD (gastroesophageal reflux disease)   . Gout attack 09/2012  . Sinusitis, maxillary, chronic   . Type II or unspecified type diabetes mellitus without mention of complication, not stated as uncontrolled     Past Surgical History:  Procedure Laterality Date  . COLONOSCOPY  2003  . HERNIA REPAIR  1980    No Known Allergies  Allergies as of 04/23/2016   No Known Allergies     Medication List       Accurate as of 04/23/16  8:25 AM. Always use your most recent med list.          acetaminophen 325 MG tablet Commonly known as:  TYLENOL Take 650 mg by mouth every 6 (six) hours as needed for mild pain.   albuterol (2.5 MG/3ML) 0.083% nebulizer solution Commonly known as:  PROVENTIL INHALE CONTENTS OF 1 VIAL IN NEBULIZER EVERY 4 HOURS AS NEEDED FOR WHEEZING AND ASTHMA   allopurinol 300 MG tablet Commonly known as:  ZYLOPRIM TAKE 1 TABLET BY MOUTH EVERY DAY   amiodarone 200 MG tablet Commonly known as:  PACERONE TAKE 1 TABLET TWICE A DAY   apixaban 5 MG Tabs tablet Commonly known as:  ELIQUIS Take 1 tablet (5 mg total) by mouth 2 (two) times daily.   CENTRUM SILVER ADULT 50+ PO Take 1 tablet by mouth daily.  fluticasone 50 MCG/ACT nasal spray Commonly known as:  FLONASE Place 1 spray into both nostrils daily as needed for allergies.   fluticasone furoate-vilanterol 100-25 MCG/INH Aepb Commonly known as:  BREO ELLIPTA Inhale 1 puff into the lungs daily.   KLOR-CON M20 20 MEQ tablet Generic drug:  potassium chloride SA TAKE 1 TABLET BY MOUTH EVERY DAY   levothyroxine 150 MCG tablet Commonly known as:  SYNTHROID, LEVOTHROID Take 150 mcg by mouth daily before breakfast.   linagliptin 5 MG Tabs tablet Commonly known as:  TRADJENTA Take 1 tablet (5 mg total) by mouth daily.   montelukast 10 MG tablet Commonly known as:  SINGULAIR Take one tablet  by mouth at bedtime as needed for allergies and asthma   pravastatin 20 MG tablet Commonly known as:  PRAVACHOL TAKE 1 TABLET BY MOUTH EVERY DAY WITH SUPPER   silver sulfADIAZINE 1 % cream Commonly known as:  SILVADENE Apply 1 application topically daily. To right ankle wound   torsemide 20 MG tablet Commonly known as:  DEMADEX Take 1 tablet (20 mg total) by mouth 2 (two) times daily.       Review of Systems:  Review of Systems  Constitutional: Negative for chills, fever and malaise/fatigue.  HENT: Negative for congestion and hearing loss.   Eyes: Negative for blurred vision.  Respiratory: Positive for shortness of breath. Negative for cough, hemoptysis, sputum production and wheezing.   Cardiovascular: Positive for leg swelling. Negative for chest pain, palpitations, orthopnea, claudication and PND.  Gastrointestinal: Negative for abdominal pain, blood in stool, constipation and melena.  Genitourinary: Negative for dysuria.  Musculoskeletal: Negative for falls.  Skin: Negative for itching and rash.  Neurological: Negative for dizziness, loss of consciousness and weakness.  Endo/Heme/Allergies: Negative for polydipsia.  Psychiatric/Behavioral: Positive for memory loss. Negative for depression.    Health Maintenance  Topic Date Due  . URINE MICROALBUMIN  05/29/2016  . HEMOGLOBIN A1C  09/25/2016  . OPHTHALMOLOGY EXAM  12/12/2016  . FOOT EXAM  03/02/2017  . TETANUS/TDAP  02/05/2021  . INFLUENZA VACCINE  Completed  . PNA vac Low Risk Adult  Completed    Physical Exam: Vitals:   04/23/16 0819  BP: 138/70  Pulse: 72  Temp: 97.8 F (36.6 C)  TempSrc: Oral  SpO2: 94%  Weight: 240 lb (108.9 kg)   Body mass index is 36.49 kg/m. Physical Exam  Constitutional: He is oriented to person, place, and time. He appears well-developed and well-nourished. No distress.  Cardiovascular: Intact distal pulses.   irreg irreg  Pulmonary/Chest: Effort normal and breath sounds  normal. No respiratory distress. He has no wheezes. He has no rales.  Musculoskeletal: Normal range of motion.  Neurological: He is alert and oriented to person, place, and time.  Skin: Skin is warm and dry.  Dry yellow skin over venous ulcer on right medial ankle  Psychiatric: He has a normal mood and affect.    Labs reviewed: Basic Metabolic Panel:  Recent Labs  01/17/16 1146 03/01/16 0819 03/28/16 1015  04/10/16 0456 04/11/16 0516 04/12/16 0406  NA 142 142 141  < > 141 137 137  K 4.0 3.9 4.4  < > 4.9 4.4 4.5  CL 103 98 97*  < > 99* 96* 96*  CO2 31 33* 35*  < > 33* 32 33*  GLUCOSE 85 87 78  < > 88 85 76  BUN 18 32* 30*  < > 35* 38* 41*  CREATININE 1.76* 1.97* 2.19*  < > 2.21* 2.22*  2.10*  CALCIUM 9.2 9.3 8.6  < > 9.3 9.0 8.9  TSH 4.92* 5.82* 5.71*  --   --   --   --   < > = values in this interval not displayed. Liver Function Tests:  Recent Labs  10/28/15 0938 03/01/16 0819 03/28/16 1015  AST 30 40* 34  ALT 22 25 24   ALKPHOS 64 113 123*  BILITOT 0.8 0.8 0.9  PROT 7.8 7.5 7.4  ALBUMIN 4.2 4.2 4.1   No results for input(s): LIPASE, AMYLASE in the last 8760 hours. No results for input(s): AMMONIA in the last 8760 hours. CBC:  Recent Labs  10/28/15 0938 11/03/15 1006 03/01/16 0819  04/09/16 1227 04/10/16 0456 04/11/16 0516  WBC 4.9 5.9 3.6*  < > 5.4 4.5 4.3  NEUTROABS 3.7 4,130 2,160  --   --   --   --   HGB 11.5* 12.1* 13.0*  < > 12.0* 11.8* 11.7*  HCT 38.0* 37.2* 40.9  < > 40.0 38.6* 37.9*  MCV 100.8* 94.7 95.1  < > 100.0 98.5 97.2  PLT 168 240 212  < > 147* 164 164  < > = values in this interval not displayed. Lipid Panel:  Recent Labs  08/24/15 0825 03/01/16 0819 03/28/16 1015  CHOL 163 161 162  HDL 83 69 84  LDLCALC 69 80 70  TRIG 55 62 41  CHOLHDL 2.0 2.3 1.9   Lab Results  Component Value Date   HGBA1C 5.9 (H) 03/28/2016    Procedures since last visit: Dg Chest 2 View  Result Date: 04/09/2016 CLINICAL DATA:  Two weeks of  shortness of breath and peripheral edema and chest pressure. History of atrial flutter, COPD, hypertension, obesity. Former smoker. EXAM: CHEST  2 VIEW COMPARISON:  PA and lateral chest x-ray of October 28, 2015 FINDINGS: The lungs are well-expanded. There is a new small right pleural effusion however. There is hazy increased density posteriorly in the right lower lobe. The cardiac silhouette is enlarged. The pulmonary vascularity is engorged and exhibits mild cephalization. There is calcification in the wall of the aortic arch. The bony thorax exhibits no acute abnormality. IMPRESSION: CHF with mild interstitial edema and new small right pleural effusion. In addition confluent density posteriorly in the right lung suggests atelectasis or early pneumonia. Followup PA and lateral chest X-ray is recommended in 3-4 weeks following trial of antibiotic therapy to ensure resolution and exclude underlying malignancy. Thoracic aortic atherosclerosis. Electronically Signed   By: David  Martinique M.D.   On: 04/09/2016 13:46    Assessment/Plan 1. Acute on chronic diastolic CHF (congestive heart failure) (HCC) - acute exacerbation resolved -cont current demadex back at 20mg  po bid -reports he's going to cardiology for labs after this already so I did not order the bmp I planned -we've been regulating his tsh, but he gets hospitalized, labs get checked and dose gets changed and cycle is altered -cont daily wts, 2 liter fluid restriction, avoid added sodium to diet, exercise regularly -notify me or Dr. Claiborne Billings if weight trending up (seems it's not based on home weights by his report though it was up 3 lbs from the hospital here--our scale seems to read a bit higher)  2. Acute renal failure superimposed on stage 3 chronic kidney disease, unspecified acute renal failure type ([place]) -did not tolerate reduction of his demadex to daily when renal function worsened -cont to monitor  3. Venous ulcer of ankle, right  (HCC) -is gradually improving--all dried up now -cont to  elevate feet at rest  4. Benign essential hypertension -bp controlled with current regimen and is actually not on bp meds at present  Labs/tests ordered:  Keep labs in April as planned and f/u visit then Next appt:  05/29/2016  Finn Amos L. Yiannis Tulloch, D.O. [facility] [address], [place] [address] Cell Phone (Mon-Fri 8am-5pm):  [phone] On Call:  [phone] & follow prompts after 5pm & weekends Office Phone:  [phone] Office Fax:  [phone]

## 2016-04-26 ENCOUNTER — Ambulatory Visit (INDEPENDENT_AMBULATORY_CARE_PROVIDER_SITE_OTHER): Payer: Medicare HMO | Admitting: Cardiovascular Disease

## 2016-04-26 ENCOUNTER — Encounter: Payer: Self-pay | Admitting: Cardiovascular Disease

## 2016-04-26 ENCOUNTER — Telehealth: Payer: Self-pay | Admitting: Cardiovascular Disease

## 2016-04-26 VITALS — BP 107/61 | HR 66 | Ht 65.0 in | Wt 241.0 lb

## 2016-04-26 DIAGNOSIS — I480 Paroxysmal atrial fibrillation: Secondary | ICD-10-CM

## 2016-04-26 DIAGNOSIS — I10 Essential (primary) hypertension: Secondary | ICD-10-CM

## 2016-04-26 DIAGNOSIS — G4733 Obstructive sleep apnea (adult) (pediatric): Secondary | ICD-10-CM

## 2016-04-26 DIAGNOSIS — E785 Hyperlipidemia, unspecified: Secondary | ICD-10-CM

## 2016-04-26 DIAGNOSIS — I4892 Unspecified atrial flutter: Secondary | ICD-10-CM | POA: Diagnosis not present

## 2016-04-26 DIAGNOSIS — E1121 Type 2 diabetes mellitus with diabetic nephropathy: Secondary | ICD-10-CM | POA: Diagnosis not present

## 2016-04-26 DIAGNOSIS — R600 Localized edema: Secondary | ICD-10-CM

## 2016-04-26 DIAGNOSIS — I872 Venous insufficiency (chronic) (peripheral): Secondary | ICD-10-CM

## 2016-04-26 DIAGNOSIS — N184 Chronic kidney disease, stage 4 (severe): Secondary | ICD-10-CM

## 2016-04-26 NOTE — Patient Instructions (Signed)
Low-Sodium Eating Plan Sodium, which is an element that makes up salt, helps you maintain a healthy balance of fluids in your body. Too much sodium can increase your blood pressure and cause fluid and waste to be held in your body. Your health care provider or dietitian may recommend following this plan if you have high blood pressure (hypertension), kidney disease, liver disease, or heart failure. Eating less sodium can help lower your blood pressure, reduce swelling, and protect your heart, liver, and kidneys. What are tips for following this plan? General guidelines   Most people on this plan should limit their sodium intake to 1,500-2,000 mg (milligrams) of sodium each day. Reading food labels   The Nutrition Facts label lists the amount of sodium in one serving of the food. If you eat more than one serving, you must multiply the listed amount of sodium by the number of servings.  Choose foods with less than 140 mg of sodium per serving.  Avoid foods with 300 mg of sodium or more per serving. Shopping   Look for lower-sodium products, often labeled as "low-sodium" or "no salt added."  Always check the sodium content even if foods are labeled as "unsalted" or "no salt added".  Buy fresh foods.  Avoid canned foods and premade or frozen meals.  Avoid canned, cured, or processed meats  Buy breads that have less than 80 mg of sodium per slice. Cooking   Eat more home-cooked food and less restaurant, buffet, and fast food.  Avoid adding salt when cooking. Use salt-free seasonings or herbs instead of table salt or sea salt. Check with your health care provider or pharmacist before using salt substitutes.  Cook with plant-based oils, such as canola, sunflower, or olive oil. Meal planning   When eating at a restaurant, ask that your food be prepared with less salt or no salt, if possible.  Avoid foods that contain MSG (monosodium glutamate). MSG is sometimes added to [place] food,  bouillon, and some canned foods. What foods are recommended? The items listed may not be a complete list. Talk with your dietitian about what dietary choices are best for you. Grains  Low-sodium cereals, including oats, puffed wheat and rice, and shredded wheat. Low-sodium crackers. Unsalted rice. Unsalted pasta. Low-sodium bread. Whole-grain breads and whole-grain pasta. Vegetables  Fresh or frozen vegetables. "No salt added" canned vegetables. "No salt added" tomato sauce and paste. Low-sodium or reduced-sodium tomato and vegetable juice. Fruits  Fresh, frozen, or canned fruit. Fruit juice. Meats and other protein foods  Fresh or frozen (no salt added) meat, poultry, seafood, and fish. Low-sodium canned tuna and salmon. Unsalted nuts. Dried peas, beans, and lentils without added salt. Unsalted canned beans. Eggs. Unsalted nut butters. Dairy  Milk. Soy milk. Cheese that is naturally low in sodium, such as ricotta cheese, fresh mozzarella, or Swiss cheese Low-sodium or reduced-sodium cheese. Cream cheese. Yogurt. Fats and oils  Unsalted butter. Unsalted margarine with no trans fat. Vegetable oils such as canola or olive oils. Seasonings and other foods  Fresh and dried herbs and spices. Salt-free seasonings. Low-sodium mustard and ketchup. Sodium-free salad dressing. Sodium-free light mayonnaise. Fresh or refrigerated horseradish. Lemon juice. Vinegar. Homemade, reduced-sodium, or low-sodium soups. Unsalted popcorn and pretzels. Low-salt or salt-free chips. What foods are not recommended? The items listed may not be a complete list. Talk with your dietitian about what dietary choices are best for you. Grains  Instant hot cereals. Bread stuffing, pancake, and biscuit mixes. Croutons. Seasoned rice or pasta  mixes. Noodle soup cups. Boxed or frozen macaroni and cheese. Regular salted crackers. Self-rising flour. Vegetables  Sauerkraut, pickled vegetables, and relishes. Olives. [place] fries. Onion  rings. Regular canned vegetables (not low-sodium or reduced-sodium). Regular canned tomato sauce and paste (not low-sodium or reduced-sodium). Regular tomato and vegetable juice (not low-sodium or reduced-sodium). Frozen vegetables in sauces. Meats and other protein foods  Meat or fish that is salted, canned, smoked, spiced, or pickled. Bacon, ham, sausage, hotdogs, corned beef, chipped beef, packaged lunch meats, salt pork, jerky, pickled herring, anchovies, regular canned tuna, sardines, salted nuts. Dairy  Processed cheese and cheese spreads. Cheese curds. Blue cheese. Feta cheese. String cheese. Regular cottage cheese. Buttermilk. Canned milk. Fats and oils  Salted butter. Regular margarine. Ghee. Bacon fat. Seasonings and other foods  Onion salt, garlic salt, seasoned salt, table salt, and sea salt. Canned and packaged gravies. Worcestershire sauce. Tartar sauce. Barbecue sauce. Teriyaki sauce. Soy sauce, including reduced-sodium. Steak sauce. Fish sauce. Oyster sauce. Cocktail sauce. Horseradish that you find on the shelf. Regular ketchup and mustard. Meat flavorings and tenderizers. Bouillon cubes. Hot sauce and Tabasco sauce. Premade or packaged marinades. Premade or packaged taco seasonings. Relishes. Regular salad dressings. Salsa. Potato and tortilla chips. Corn chips and puffs. Salted popcorn and pretzels. Canned or dried soups. Pizza. Frozen entrees and pot pies. Summary  Eating less sodium can help lower your blood pressure, reduce swelling, and protect your heart, liver, and kidneys.  Most people on this plan should limit their sodium intake to 1,500-2,000 mg (milligrams) of sodium each day.  Canned, boxed, and frozen foods are high in sodium. Restaurant foods, fast foods, and pizza are also very high in sodium. You also get sodium by adding salt to food.  Try to cook at home, eat more fresh fruits and vegetables, and eat less fast food, canned, processed, or prepared foods. This  information is not intended to replace advice given to you by your health care provider. Make sure you discuss any questions you have with your health care provider. Document Released: 07/14/2001 Document Revised: 01/16/2016 Document Reviewed: 01/16/2016 Elsevier Interactive Patient Education  2017 [place].            How to Use Compression Stockings Compression stockings are elastic socks that squeeze the legs. They help to increase blood flow to the legs, decrease swelling in the legs, and reduce the chance of developing blood clots in the lower legs. Compression stockings are often used by people who:  Are recovering from surgery.  Have poor circulation in their legs.  Are prone to getting blood clots in their legs.  Have varicose veins.  Sit or stay in bed for long periods of time. How to use compression stockings Before you put on your compression stockings:  Make sure that they are the correct size. If you do not know your size, ask your health care provider.  Make sure that they are clean, dry, and in good condition.  Check them for rips and tears. Do not put them on if they are ripped or torn. Put your stockings on first thing in the morning, before you get out of bed. Keep them on for as long as your health care provider advises. When you are wearing your stockings:  Keep them as smooth as possible. Do not allow them to bunch up. It is especially important to prevent the stockings from bunching up around your toes or behind your knees.  Do not roll the stockings downward and leave  them rolled down. This can decrease blood flow to your leg.  Change them right away if they become wet or dirty. 1.  When you take off your stockings, inspect your legs and feet. Anything that does not seem normal may require medical attention. Look for:  Open sores.  Red spots.  Swelling. Information and tips  Do not stop wearing your compression stockings without talking  to your health care provider first.  Wash your stockings every day with mild detergent in cold or warm water. Do not use bleach. Air-dry your stockings or dry them in a clothes dryer on low heat.  Replace your stockings every 3-6 months.  If skin moisturizing is part of your treatment plan, apply lotion or cream at night so that your skin will be dry when you put on the stockings in the morning. It is harder to put the stockings on when you have lotion on your legs or feet. Contact a health care provider if: Remove your stockings and seek medical care if:  You have a feeling of pins and needles in your feet or legs.  You have any new changes in your skin.  You have skin lesions that are getting worse.  You have swelling or pain that is getting worse. Get help right away if:  You have numbness or tingling in your lower legs that does not get better right after you take the stockings off.  Your toes or feet become cold and blue.  You develop open sores or red spots on your legs that do not go away.  You see or feel a warm spot on your leg.  You have new swelling or soreness in your leg.  You are short of breath or you have chest pain for no reason.  You have a rapid or irregular heartbeat.  You feel light-headed or dizzy. This information is not intended to replace advice given to you by your health care provider. Make sure you discuss any questions you have with your health care provider. Document Released: 11/19/2008 Document Revised: 06/22/2015 Document Reviewed: 12/30/2013 Elsevier Interactive Patient Education  2017 [place] physician wants you to follow-up in: 6 months or sooner if needed. You will receive a reminder letter in the mail two months in advance. If you don't receive a letter, please call our office to schedule the follow-up appointment.

## 2016-04-26 NOTE — Telephone Encounter (Signed)
Attempt to return [email or web address] 

## 2016-04-26 NOTE — Progress Notes (Signed)
Patient ID: Omar Lambert, male   DOB: Nov 03, 1940, 76 y.o.   MRN: [id number]     HPI:   Omar Lambert is a 76 year old African American male who has a history of severe COPD, diabetes mellitus, chronic kidney disease, hypertension, obstructive sleep apnea and atrial flutter.  He presents for a 46-monthfollow-up evaluation.    Omar Lambert a history of lower extremity venous insufficiency. A lower extremity venous Doppler study  did not show evidence for thrombus or thrombophlebitis but he had bilateral deep venous insufficiency within the femoral and popliteal veins. No venous insufficiency was noted in the right and left greater saphenous vein or small saphenous vein. A 2-D echo Doppler study on 12/17/2012 demonstrated normal systolic function with an ejection fraction of 55-60% as well as normal diastolic function. He had mild biatrial enlargement and mild dilatation of his right ventricle. There is mild pulmonary hypertension with a PA pressure 39 mm.  He has a history of obstructive sleep apnea.  After not having seen him in over 2 years when I recently saw him he was only using his CPAP at most 30% of the time.  He needed new supplies.  He admitted to experiencing some shortness of breath with activity.  He states he recently had a bad cold.  He continues to note leg swelling.  He is unaware of any rhythm issues.  He has a history of significant obesity.  The last several weeks he has been using a diet supplement Hydroxycut, of which there is significant amount of coffee extract in this preparation.   When I saw him, his ECG suggested atrial flutter with variable block.  At that time, I advised he discontinue his over-the-counter supplement.  With his elevated [id number]  score, I recommend initiation of anticoagulation.  Since he had had renal insufficiency with creatinine in April 2016 at1.72 and creatinine in July at 1.48,  I initially started him on Xarelto 15 mg for anticoagulation.  He  underwent an echo Doppler study which revealed an EF of 60-65%.  His left atrium was only at the upper limits of normal in size.  However, his right atrium was severely dilated.  There was mild tricuspid regurgitation.  I also initiated metoprolol, initially at 12.5 mg since his resting pulse was already in the 60s.  I also discussed the importance of improved CPAP compliance.  He has been using CPAP with 100% compliance. Blood work in 2016 showed stable hemoglobin and hematocrit.  His current renal function continued to improve and was now 1.36.  Lipid studies revealed a total cholesterol 174, triglycerides 63, HDL 55, and LDL 106.  He had a normal magnesium.  TSH was 3.53.  He states he has not taken Xarelto for at least 10 days when he began to notice some flecks of blood in stool.  He denied any overt GI bleeding.   When I saw him in November 2016.  I discontinued his Xarelto and changed him to Eliquis 5 mg twice a day.  His renal function had improved.  I recommended that he discontinue aspirin.  I entered amiodarone at 2 mg daily in attempt to possibly pharmacologically cardiovert him.  He presents now for follow-up evaluation.  Since I last saw him , [facility] with worsening shortness of breath, orthopnea, and weight gain, after his diuretic dose was reduced due to concerns for worsening renal function.  Repeat was 460.  He was  treated with IV Lasix and diuresed 7 pounds.  He was  transitioned to his prior dose of torsemide 20 mg twice a day.  His chest x-ray was concerning for a possible community-acquired pneumonia and he was empirically placed on IV Levaquin and transition to oral therapy.  He has continued to be on eliquis  for anticoagulation.  Socially, his breathing has been better.  He has significantly reduced his sodium intake.  He presents for follow-up evaluation  Past Medical History:  Diagnosis Date  . Allergy   . Asthma   .  Atrophic gastritis   . Benign essential hypertension   . CHF (congestive heart failure) ([place])   . COPD (chronic obstructive pulmonary disease) ([place])   . Diabetes ([place])   . GERD (gastroesophageal reflux disease)   . Gout attack 09/2012  . Sinusitis, maxillary, chronic   . Type II or unspecified type diabetes mellitus without mention of complication, not stated as uncontrolled     Past Surgical History:  Procedure Laterality Date  . COLONOSCOPY  2003  . HERNIA REPAIR  1980    No Known Allergies  Current Outpatient Prescriptions  Medication Sig Dispense Refill  . acetaminophen (TYLENOL) 325 MG tablet Take 650 mg by mouth every 6 (six) hours as needed for mild pain.    Marland Kitchen albuterol (PROVENTIL) (2.5 MG/3ML) 0.083% nebulizer solution INHALE CONTENTS OF 1 VIAL IN NEBULIZER EVERY 4 HOURS AS NEEDED FOR WHEEZING AND ASTHMA 75 mL 1  . allopurinol (ZYLOPRIM) 300 MG tablet TAKE 1 TABLET BY MOUTH EVERY DAY 30 tablet 6  . amiodarone (PACERONE) 200 MG tablet TAKE 1 TABLET TWICE A DAY 60 tablet 2  . apixaban (ELIQUIS) 5 MG TABS tablet Take 1 tablet (5 mg total) by mouth 2 (two) times daily. 60 tablet 5  . fluticasone (FLONASE) 50 MCG/ACT nasal spray Place 1 spray into both nostrils daily as needed for allergies.     . fluticasone furoate-vilanterol (BREO ELLIPTA) 100-25 MCG/INH AEPB Inhale 1 puff into the lungs daily.    Marland Kitchen KLOR-CON M20 20 MEQ tablet TAKE 1 TABLET BY MOUTH EVERY DAY 30 tablet 1  . levothyroxine (SYNTHROID, LEVOTHROID) 175 MCG tablet Take 1 tablet by mouth daily.    Marland Kitchen linagliptin (TRADJENTA) 5 MG TABS tablet Take 1 tablet (5 mg total) by mouth daily. 90 tablet 3  . montelukast (SINGULAIR) 10 MG tablet Take one tablet by mouth at bedtime as needed for allergies and asthma 90 tablet 0  . Multiple Vitamins-Minerals (CENTRUM SILVER ADULT 50+ PO) Take 1 tablet by mouth daily.    . pravastatin (PRAVACHOL) 20 MG tablet TAKE 1 TABLET BY MOUTH EVERY DAY WITH SUPPER 30 tablet 3  . silver  sulfADIAZINE (SILVADENE) 1 % cream Apply 1 application topically daily. To right ankle wound 50 g 3  . torsemide (DEMADEX) 20 MG tablet Take 1 tablet (20 mg total) by mouth 2 (two) times daily. 60 tablet 0   No current facility-administered medications for this visit.     Social history is notable in that he is married for 55 years. He has 3 children 8 grandchildren 6 great-grandchildren. He is retired from [employer]. He completed ninth grade education. He smoked until 1999.  Family History  Problem Relation Age of Onset  . Stroke Mother   . Cancer Sister   . Diabetes Sister   . COPD Sister   . COPD Sister     ROS General: Negative; No fevers, chills, or night sweats; no 6S  with weight loss HEENT: Negative; No changes in vision or hearing, sinus congestion, difficulty swallowing Pulmonary: Positive for severe COPD.  Positive for shortness of breath. Cardiovascular: Negative; No chest pain, presyncope, syncope, palpitations Positive for leg swelling GI: Negative; No nausea, vomiting, diarrhea, or abdominal pain GU: Negative; No dysuria, hematuria, or difficulty voiding Musculoskeletal: Negative; no myalgias, joint pain, or weakness Rheumatologic: Positive for gout  Hematologic/Oncology: Negative; no easy bruising, bleeding Endocrine: Positive for diabetes mellitus Neuro: Negative; no changes in balance, headaches Skin: Negative; No rashes or skin lesions Psychiatric: Negative; No behavioral problems, depression Sleep: Positive for obstructive sleep apnea; now with improved compliance.  No snoring, daytime sleepiness, hypersomnolence, bruxism, restless legs, hypnogognic hallucinations, no cataplexy Other comprehensive 14 point system review is negative.   PE BP 107/61   Pulse 66   Ht '5\' 5"'  (1.651 m)   Wt 241 lb (109.3 kg)   BMI 40.10 kg/m    Repeat blood pressure by me 110/70.  BMI compatible with morbid obesity   Wt Readings from Last 3 Encounters:  04/26/16 241  lb (109.3 kg)  04/23/16 240 lb (108.9 kg)  04/12/16 237 lb 9.6 oz (107.8 kg)   General: Alert, oriented, no distress.  Skin: normal turgor, no rashes HEENT: Normocephalic, atraumatic. Pupils round and reactive; sclera anicteric; Fundi mild arteriolar narrowing. No hemorrhages or exudates. Nose without nasal septal hypertrophy Mouth/Parynx benign; Mallinpatti scale 4 Neck: Thick neck No JVD, no carotid bruits Lungs: clear to ausculatation and percussion; no wheezing or rales Heart: Mildly irregular rhythm; s1 s2 normal a 1/6 systolic murmur. No S3 or S4 gallop. No rubs, thrills or heaves. Abdomen: Diastases recti; soft, nontender; no hepatosplenomehaly, BS+; abdominal aorta nontender and not dilated by palpation. Pulses 2+ Extremities: Trace  tense lower extremity edema. no clubbinbg cyanosis, Homan's sign negative  Neurologic: grossly nonfocal; radial nerves grossly intact  Psychologic: Normal mood  ECG (independently read by me): Normal sinus rhythm at 66 bpm.  First-degree AV block with a PR interval at 280 ms.  QTc interval 461.  January 2018 ECG (independently read by me): Normal sinus rhythm at 67 bpm with first-degree AV block.  PR interval 252 ms.  Nonspecific T changes.  March 2017 ECG (independently read by me): Atrial fibrillation at 92 bpm with occasional unifocal PVCs  November 2016 ECG (independently read by me): Atrial fibrillation at 80 bpm with 1 PVC.  11/10/2014 ECG (independently read by me): Atrial flutter with variable block.  Left axis deviation.  Nonspecific T changes.  QTc interval 458 ms.  January 2015 ECG (independently read by me): Sinus rhythm with frequent atrial premature conk contractions as well as occasional unifocal premature ventricular contractions. Nonspecific ST and T wave changes.  Prior ECG of 11/25/2012: Sinus rhythm with first-degree AV block with occasional PVC. The patient is entirely asymptomatic with reference to this ectopy.  LABS: BMP  Latest Ref Rng & Units 04/23/2016 04/12/2016 04/11/2016  Glucose 65 - 99 mg/dL 102(H) 76 85  BUN 7 - 25 mg/dL 32(H) 41(H) 38(H)  Creatinine 0.70 - 1.18 mg/dL 1.93(H) 2.10(H) 2.22(H)  BUN/Creat Ratio 10 - 24 - - -  Sodium 135 - 146 mmol/L 142 137 137  Potassium 3.5 - 5.3 mmol/L 4.6 4.5 4.4  Chloride 98 - 110 mmol/L 100 96(L) 96(L)  CO2 20 - 31 mmol/L 35(H) 33(H) 32  Calcium 8.6 - 10.3 mg/dL 9.1 8.9 9.0   Hepatic Function Latest Ref Rng & Units 03/28/2016 03/01/2016 10/28/2015  Total Protein 6.1 -  8.1 g/dL 7.4 7.5 7.8  Albumin 3.6 - 5.1 g/dL 4.1 4.2 4.2  AST 10 - 35 U/L 34 40(H) 30  ALT 9 - 46 U/L '24 25 22  ' Alk Phosphatase 40 - 115 U/L 123(H) 113 64  Total Bilirubin 0.2 - 1.2 mg/dL 0.9 0.8 0.8  Bilirubin, Direct 0.0 - 0.3 mg/dL - - -   CBC Latest Ref Rng & Units 04/11/2016 04/10/2016 04/09/2016  WBC 4.0 - 10.5 K/uL 4.3 4.5 5.4  Hemoglobin 13.0 - 17.0 g/dL 11.7(L) 11.8(L) 12.0(L)  Hematocrit 39.0 - 52.0 % 37.9(L) 38.6(L) 40.0  Platelets 150 - 400 K/uL 164 164 147(L)   Lab Results  Component Value Date   MCV 97.2 04/11/2016   MCV 98.5 04/10/2016   MCV 100.0 04/09/2016   Lab Results  Component Value Date   TSH 5.71 (H) 03/28/2016   Lab Results  Component Value Date   HGBA1C 5.9 (H) 03/28/2016   Lipid Panel     Component Value Date/Time   CHOL 162 03/28/2016 1015   CHOL 151 05/25/2015 0950   TRIG 41 03/28/2016 1015   HDL 84 03/28/2016 1015   HDL 68 05/25/2015 0950   CHOLHDL 1.9 03/28/2016 1015   VLDL 8 03/28/2016 1015   LDLCALC 70 03/28/2016 1015   LDLCALC 68 05/25/2015 0950      RADIOLOGY: US Abdomen Complete  11/25/2012   CLINICAL DATA:  Abdominal distention  EXAM: ULTRASOUND ABDOMEN COMPLETE  COMPARISON:  None.  FINDINGS: Gallbladder  No gallstones or wall thickening visualized. No sonographic Murphy sign noted.  Common bile duct  Diameter: 4.2 mm  Liver  No focal lesion identified. Within normal limits in parenchymal echogenicity.  IVC  No abnormality visualized.  Pancreas   Visualized portion unremarkable.  Spleen  Size and appearance within normal limits.  Right Kidney  Length: 9.8 cm in length. A 1.8 cm cyst is noted in the parapelvic region.  Left Kidney  Length: 10.7 cm in length. Echogenicity within normal limits. No mass or hydronephrosis visualized.  Abdominal aorta  No aneurysm visualized.  IMPRESSION: Right renal cyst. No other focal abnormality is noted.   Electronically Signed   By: Inez Catalina M.D.   On: 11/25/2012 08:12   IMPRESSION:  1. Essential hypertension   2. Venous insufficiency of both lower extremities   3. Atrial flutter, unspecified type ([place])   4. Paroxysmal atrial fibrillation ([place])   5. OSA (obstructive sleep apnea)   6. Morbid obesity ([place])   7. CKD (chronic kidney disease) stage 4, GFR 15-29 ml/min ([facility])   8. Bilateral lower extremity edema   9. Hyperlipidemia with target LDL less than 70   10. Controlled type 2 diabetes mellitus with diabetic nephropathy, without long-term current use of insulin ([place])     ASSESSMENT AND PLAN: Omar Lambert is a 76 year old African American male with a long history of prior tobacco use but quit over 18 years ago. He has COPD. A prior echo Doppler study demonstrated normal systolic and diastolic function with mild biatrial enlargement as well as mild dilatation of his right ventricle and mild pulmonary hypertension with an estimated PA pressure 39 mm.  An echo in 2016 showed ejection fraction at 60-65% with moderate left ventricular hypertrophy,  trivial MR, upper normal LA size but severely dilated, RA with mild tricuspid regurgitation.  There is mild pulmonary hypertension with a PA pressure at 38 mm.  When I saw him in November 2016  after not having seen him in over  2 years, he was in atrial flutter with variable block.  He was using an over-the-counter supplement which had a significant amount of caffeine.  When I saw him one month later, he was still in atrial fibrillation.  He is now tolerating  Eliquis well and is on 5 mg twice a day dosing.  He was admitted to the hospital with acute hypoxic and hypercarbic respiratory failure in the setting of his underlying COPD as well as pulmonary edema due to acute on chronic diastolic heart failure in July 2017.  He had previously undergone cardioversion and was maintaining sinus rhythm.  Echo Doppler study showed an EF of 60-65% with moderate LA and RA dilatation.  There was moderate TR.  PA pressure was 40 mm.  Inside last saw him, he was admitted again with diastolic heart failure and worsening renal function and his creatinine had increased to 2.21.  Carotid IV diuresis with 7 pound weight loss.  Subsequent blood work showed improvement in his creatinine to 1.93.  He has hypothyroidism and is now on levothyroxine 175 g.  He has been on pravastatin for hyperlipidemia with target LDL less than 70.  He is diabetic and currently is on Tradjenta.  He may ultimately be a candidate for SGLT2 inhibitor therapy such as [facility] Glade particularly with improved cardiovascular outcome data.  I reviewed his recent hospitalization.  We again discussed sodium restriction and weight loss.  He has obstructive sleep apnea and is on CPAP therapy.  He denies any GI bleeding on anticoagulation.  He is maintaining sinus rhythm on his increased amiodarone dose at 200 mg twice a day.  He will continue current therapy.  I will see him in 6 months for reevaluation. Time spent: 25 minutes  Troy Sine, MD, [facility] 04/28/2016 1:58 PM

## 2016-04-26 NOTE — Telephone Encounter (Signed)
f/u Message  Magda Paganini returning RN call. Please call back to discuss

## 2016-05-01 ENCOUNTER — Telehealth: Payer: Self-pay | Admitting: *Deleted

## 2016-05-01 NOTE — Telephone Encounter (Signed)
Patient notified of lab results

## 2016-05-01 NOTE — Telephone Encounter (Signed)
-----   Message from Troy Sine, MD sent at 05/01/2016 11:09 AM EDT ----- Cr slightly improved, now 1.93

## 2016-05-03 DIAGNOSIS — G4733 Obstructive sleep apnea (adult) (pediatric): Secondary | ICD-10-CM | POA: Diagnosis not present

## 2016-05-09 ENCOUNTER — Telehealth: Payer: Self-pay | Admitting: *Deleted

## 2016-05-09 NOTE — Telephone Encounter (Signed)
Has he gained weight?

## 2016-05-09 NOTE — Telephone Encounter (Signed)
Patient stated no weight gain but a lot of chest congestion.

## 2016-05-09 NOTE — Telephone Encounter (Signed)
Patient called and stated that he has SOB. Patient is having increased Congestion in chest and coughing up mucus. No fever, No chest pains, No arm pain, No jaw pain. Just recently saw the Cardiologist on 3/22 and patient stated everything checked out fine there. Patient scheduled an appointment for tomorrow morning with Dr. Mariea Clonts. Informed patient if any new symptoms occur like chest, Jaw,arm pain or dizziness to go to ER. He agreed.

## 2016-05-09 NOTE — Telephone Encounter (Signed)
Ok will see him at appt

## 2016-05-10 ENCOUNTER — Encounter: Payer: Self-pay | Admitting: Internal Medicine

## 2016-05-10 ENCOUNTER — Ambulatory Visit (INDEPENDENT_AMBULATORY_CARE_PROVIDER_SITE_OTHER): Payer: Medicare HMO | Admitting: Internal Medicine

## 2016-05-10 VITALS — BP 122/80 | HR 60 | Temp 97.8°F | Resp 18 | Ht 65.0 in | Wt 243.8 lb

## 2016-05-10 DIAGNOSIS — J449 Chronic obstructive pulmonary disease, unspecified: Secondary | ICD-10-CM | POA: Diagnosis not present

## 2016-05-10 DIAGNOSIS — N183 Chronic kidney disease, stage 3 (moderate): Secondary | ICD-10-CM

## 2016-05-10 DIAGNOSIS — K219 Gastro-esophageal reflux disease without esophagitis: Secondary | ICD-10-CM

## 2016-05-10 DIAGNOSIS — I5033 Acute on chronic diastolic (congestive) heart failure: Secondary | ICD-10-CM

## 2016-05-10 DIAGNOSIS — L97319 Non-pressure chronic ulcer of right ankle with unspecified severity: Secondary | ICD-10-CM

## 2016-05-10 DIAGNOSIS — I83013 Varicose veins of right lower extremity with ulcer of ankle: Secondary | ICD-10-CM | POA: Diagnosis not present

## 2016-05-10 DIAGNOSIS — I10 Essential (primary) hypertension: Secondary | ICD-10-CM

## 2016-05-10 DIAGNOSIS — N1832 Chronic kidney disease, stage 3b: Secondary | ICD-10-CM

## 2016-05-10 MED ORDER — PANTOPRAZOLE SODIUM 40 MG PO TBEC: 40.0000 mg | DELAYED_RELEASE_TABLET | Freq: Every day | ORAL | 0 refills | Status: DC

## 2016-05-10 NOTE — Progress Notes (Signed)
Location:  [facility] clinic Provider:  Carie Kapuscinski L. Mariea Clonts, D.O., C.M.D.  Code Status: DNR Goals of Care:  Advanced Directives 05/10/2016  Does Patient Have a Medical Advance Directive? Yes  Type of Paramedic of [place];Living will  Copy of [place] in Chart? Yes  Would patient like information on creating a medical advance directive? -   Chief Complaint  Patient presents with  . Medication Refill    No refills needed at this time   . Acute Visit    Starting to become short of breath, congested and has trouble laying down to sleep at night.     HPI: Patient is a 76 y.o. male seen today for acute visit for shortness of breath, congestion when lying down to sleep at night, cough.  He reported no weight gain, but has gradually had weight gain.   At hospital d/c on 04/12/16, weight was 237.6 lbs Weight last visit was 240 lbs here on 04/23/16,  241 lbs on 04/24/16.   Weight today is 243 lb.  He has also had a lot of acid coming up with foul smell.  He's been burping and belching a lot.  Sometimes he does eat before bed, but not always and the problem does not just happen at bedtime.  Can go on all day.  He gets bloated up in his abdomen when it happens.    Is using neb tx about twice a day (albuterol), but does not always give him good relief.    Past Medical History:  Diagnosis Date  . Allergy   . Asthma   . Atrophic gastritis   . Benign essential hypertension   . CHF (congestive heart failure) ([place])   . COPD (chronic obstructive pulmonary disease) ([place])   . Diabetes ([place])   . GERD (gastroesophageal reflux disease)   . Gout attack 09/2012  . Sinusitis, maxillary, chronic   . Type II or unspecified type diabetes mellitus without mention of complication, not stated as uncontrolled     Past Surgical History:  Procedure Laterality Date  . COLONOSCOPY  2003  . HERNIA REPAIR  1980    No Known Allergies  Allergies as of 05/10/2016   No Known  Allergies     Medication List       Accurate as of 05/10/16 10:13 AM. Always use your most recent med list.          acetaminophen 325 MG tablet Commonly known as:  TYLENOL Take 650 mg by mouth every 6 (six) hours as needed for mild pain.   albuterol (2.5 MG/3ML) 0.083% nebulizer solution Commonly known as:  PROVENTIL INHALE CONTENTS OF 1 VIAL IN NEBULIZER EVERY 4 HOURS AS NEEDED FOR WHEEZING AND ASTHMA   allopurinol 300 MG tablet Commonly known as:  ZYLOPRIM TAKE 1 TABLET BY MOUTH EVERY DAY   amiodarone 200 MG tablet Commonly known as:  PACERONE TAKE 1 TABLET TWICE A DAY   apixaban 5 MG Tabs tablet Commonly known as:  ELIQUIS Take 1 tablet (5 mg total) by mouth 2 (two) times daily.   CENTRUM SILVER ADULT 50+ PO Take 1 tablet by mouth daily.   fluticasone 50 MCG/ACT nasal spray Commonly known as:  FLONASE Place 1 spray into both nostrils daily as needed for allergies.   fluticasone furoate-vilanterol 100-25 MCG/INH Aepb Commonly known as:  BREO ELLIPTA Inhale 1 puff into the lungs daily.   KLOR-CON M20 20 MEQ tablet Generic drug:  potassium chloride SA TAKE 1  TABLET BY MOUTH EVERY DAY   levothyroxine 175 MCG tablet Commonly known as:  SYNTHROID, LEVOTHROID Take 1 tablet by mouth daily.   linagliptin 5 MG Tabs tablet Commonly known as:  TRADJENTA Take 1 tablet (5 mg total) by mouth daily.   montelukast 10 MG tablet Commonly known as:  SINGULAIR Take one tablet by mouth at bedtime as needed for allergies and asthma   pravastatin 20 MG tablet Commonly known as:  PRAVACHOL TAKE 1 TABLET BY MOUTH EVERY DAY WITH SUPPER   silver sulfADIAZINE 1 % cream Commonly known as:  SILVADENE Apply 1 application topically daily. To right ankle wound   torsemide 20 MG tablet Commonly known as:  DEMADEX Take 1 tablet (20 mg total) by mouth 2 (two) times daily.       Review of Systems:  Review of Systems  Constitutional: Negative for chills and fever.  HENT:  Negative for congestion and sore throat.   Eyes: Negative for blurred vision.  Respiratory: Positive for cough, sputum production, shortness of breath and wheezing. Negative for hemoptysis and stridor.   Cardiovascular: Positive for orthopnea. Negative for chest pain, palpitations, claudication, leg swelling and PND.  Gastrointestinal: Positive for heartburn. Negative for constipation, diarrhea, nausea and vomiting.       Abdominal bloating, acid indigestion  Genitourinary: Negative for dysuria.  Musculoskeletal: Negative for falls and myalgias.       Knees give out on him sometimes  Skin:       Right medial ankle venous ulcer drying up  Neurological: Negative for dizziness and loss of consciousness.  Endo/Heme/Allergies: Does not bruise/bleed easily.  Psychiatric/Behavioral: Positive for memory loss. Negative for depression.    Health Maintenance  Topic Date Due  . URINE MICROALBUMIN  05/29/2016  . INFLUENZA VACCINE  09/05/2016  . HEMOGLOBIN A1C  09/25/2016  . OPHTHALMOLOGY EXAM  12/12/2016  . FOOT EXAM  03/02/2017  . TETANUS/TDAP  02/05/2021  . PNA vac Low Risk Adult  Completed    Physical Exam: Vitals:   05/10/16 1002  BP: (!) 146/72  Pulse: 60  Resp: 18  Temp: 97.8 F (36.6 C)  TempSrc: Oral  SpO2: 95%  Weight: 243 lb 12.8 oz (110.6 kg)  Height: 5\' 5"  (1.651 m)   Body mass index is 40.57 kg/m. Physical Exam  Constitutional: He is oriented to person, place, and time. He appears well-developed and well-nourished. No distress.  Cardiovascular:  irreg irreg, no m/g/r  Pulmonary/Chest: Effort normal. No respiratory distress. He has wheezes. He has rales.  Wheezing improved some with cough  Abdominal: Bowel sounds are normal.  Musculoskeletal: He exhibits no tenderness.  Neurological: He is alert and oriented to person, place, and time.  Poor historian, some short term memory loss  Skin: Skin is warm and dry.  Dry ulcer right medial ankle with yellow scale on it,  mildly tender  Psychiatric: He has a normal mood and affect.    Labs reviewed: Basic Metabolic Panel:  Recent Labs  01/17/16 1146 03/01/16 0819 03/28/16 1015  04/11/16 0516 04/12/16 0406 04/23/16 0939  NA 142 142 141  < > 137 137 142  K 4.0 3.9 4.4  < > 4.4 4.5 4.6  CL 103 98 97*  < > 96* 96* 100  CO2 31 33* 35*  < > 32 33* 35*  GLUCOSE 85 87 78  < > 85 76 102*  BUN 18 32* 30*  < > 38* 41* 32*  CREATININE 1.76* 1.97* 2.19*  < > 2.22*  2.10* 1.93*  CALCIUM 9.2 9.3 8.6  < > 9.0 8.9 9.1  TSH 4.92* 5.82* 5.71*  --   --   --   --   < > = values in this interval not displayed. Liver Function Tests:  Recent Labs  10/28/15 0938 03/01/16 0819 03/28/16 1015  AST 30 40* 34  ALT 22 25 24   ALKPHOS 64 113 123*  BILITOT 0.8 0.8 0.9  PROT 7.8 7.5 7.4  ALBUMIN 4.2 4.2 4.1   No results for input(s): LIPASE, AMYLASE in the last 8760 hours. No results for input(s): AMMONIA in the last 8760 hours. CBC:  Recent Labs  10/28/15 0938 11/03/15 1006 03/01/16 0819  04/09/16 1227 04/10/16 0456 04/11/16 0516  WBC 4.9 5.9 3.6*  < > 5.4 4.5 4.3  NEUTROABS 3.7 4,130 2,160  --   --   --   --   HGB 11.5* 12.1* 13.0*  < > 12.0* 11.8* 11.7*  HCT 38.0* 37.2* 40.9  < > 40.0 38.6* 37.9*  MCV 100.8* 94.7 95.1  < > 100.0 98.5 97.2  PLT 168 240 212  < > 147* 164 164  < > = values in this interval not displayed. Lipid Panel:  Recent Labs  08/24/15 0825 03/01/16 0819 03/28/16 1015  CHOL 163 161 162  HDL 83 69 84  LDLCALC 69 80 70  TRIG 55 62 41  CHOLHDL 2.0 2.3 1.9   Lab Results  Component Value Date   HGBA1C 5.9 (H) 03/28/2016    Assessment/Plan 1. Acute on chronic diastolic CHF (congestive heart failure) (HCC) Take your torsemide 2 tablets in the morning and 1 tablet in the evening for 3 days.  Then restart torsemide 1 tablet twice a day. Take potassium 2 tablets daily for 3 days.  Then restart 1 tablet daily.   Continue to weigh yourself daily and write it down.   Continue NAS  and low sodium diet  2. Benign essential hypertension -bp elevated upon arrival but improved to normal range at end of visit upon recheck  3. Gastroesophageal reflux disease, esophagitis presence not specified -seems this is exacerbating his COPD and causing him discomfort at hs - not relieved with tums or zantac fully -counseled on conservative measures like food selections and avoiding intake 1-2 hours before bed (sometimes works in evening and then eats late just before bed) -elevate HOB - try 6 week course of protonix only -pantoprazole (PROTONIX) 40 MG tablet; Take 1 tablet (40 mg total) by mouth daily before supper.  Dispense: 45 tablet; Refill: 0  4. Chronic kidney disease (CKD) stage G3b/A2, moderately decreased glomerular filtration rate (GFR) between 30-44 mL/min/1.73 square meter and albuminuria creatinine ratio between 30-299 mg/g -stable, f/u bmp next week  5. Venous ulcer of ankle, right (HCC) -continues to gradually improve, cont to monitor  6. COPD GOLD II -Use nebulizer treatment each evening before bed and try to cough up mucus (seems he does not really forcefully cough unless encouraged)    Labs/tests ordered:  No new today Next appt: 1 wk chf f/u  Dima Ferrufino L. Jonas Goh, D.O. [facility] [address], [place] [address] Cell Phone (Mon-Fri 8am-5pm):  [phone] On Call:  [phone] & follow prompts after 5pm & weekends Office Phone:  [phone] Office Fax:  [phone]

## 2016-05-10 NOTE — Patient Instructions (Addendum)
Take your torsemide 2 tablets in the morning and 1 tablet in the evening for 3 days.  Then restart torsemide 1 tablet twice a day. Take potassium 2 tablets daily for 3 days.  Then restart 1 tablet daily.   Continue to weigh yourself daily and write it down.    Begin protonix 40mg  20 minutes before you eat your evening meal.  Elevate the head of your bed and avoid eating for 2 hrs before lying down at night.    Use a nebulizer treatment each evening before you go to bed and try to cough up your mucus.

## 2016-05-17 ENCOUNTER — Ambulatory Visit (INDEPENDENT_AMBULATORY_CARE_PROVIDER_SITE_OTHER): Payer: Medicare HMO | Admitting: Internal Medicine

## 2016-05-17 ENCOUNTER — Encounter: Payer: Self-pay | Admitting: Internal Medicine

## 2016-05-17 VITALS — BP 138/80 | HR 71 | Temp 97.7°F | Wt 236.0 lb

## 2016-05-17 DIAGNOSIS — I83013 Varicose veins of right lower extremity with ulcer of ankle: Secondary | ICD-10-CM

## 2016-05-17 DIAGNOSIS — N1832 Chronic kidney disease, stage 3b: Secondary | ICD-10-CM

## 2016-05-17 DIAGNOSIS — I5032 Chronic diastolic (congestive) heart failure: Secondary | ICD-10-CM

## 2016-05-17 DIAGNOSIS — N183 Chronic kidney disease, stage 3 (moderate): Secondary | ICD-10-CM | POA: Diagnosis not present

## 2016-05-17 DIAGNOSIS — J449 Chronic obstructive pulmonary disease, unspecified: Secondary | ICD-10-CM | POA: Diagnosis not present

## 2016-05-17 DIAGNOSIS — L97319 Non-pressure chronic ulcer of right ankle with unspecified severity: Secondary | ICD-10-CM

## 2016-05-17 MED ORDER — COLLAGENASE 250 UNIT/GM EX OINT: 1.0000 "application " | TOPICAL_OINTMENT | Freq: Every day | CUTANEOUS | 0 refills | Status: DC

## 2016-05-17 NOTE — Progress Notes (Signed)
Location:  [facility] clinic Provider:  Maricsa Sammons L. Mariea Clonts, D.O., C.M.D.  Code Status: full code Goals of Care:  Advanced Directives 05/10/2016  Does Patient Have a Medical Advance Directive? Yes  Type of Paramedic of [place];Living will  Copy of [place] in Chart? Yes  Would patient like information on creating a medical advance directive? -   Chief Complaint  Patient presents with  . Follow-up    1 week follow-up    HPI: Patient is a 76 y.o. male seen today for medical management of chronic diseases.    CHF:  Weight down to 236 lbs from 243.8 lbs on 4/5.  Unfortunately, he is breathing fast with POX upon arrival in room of 89% on [place]. He says he was not dyspneic.  He had a good week.  He thinks the medication helped a lot with his fluid.    Ulcer:  Right ankle is still hurting where he has the ulcer which is not healing.  Seems pain is worsening and going up his right inner lower leg.      COPD:  He is not using his breo faithfully due to its cost.  Samples provided x 3 months today.  He agrees to use and call us if he runs out. O2 improved to 96% at rest on 2L,.  With exertion, dropped to 89% again, but came up to 92% at rest on RA.   CKD3:  No very recent bmp since diuresis.     Past Medical History:  Diagnosis Date  . Allergy   . Asthma   . Atrophic gastritis   . Benign essential hypertension   . CHF (congestive heart failure) ([place])   . COPD (chronic obstructive pulmonary disease) ([place])   . Diabetes ([place])   . GERD (gastroesophageal reflux disease)   . Gout attack 09/2012  . Sinusitis, maxillary, chronic   . Type II or unspecified type diabetes mellitus without mention of complication, not stated as uncontrolled     Past Surgical History:  Procedure Laterality Date  . COLONOSCOPY  2003  . HERNIA REPAIR  1980    No Known Allergies  Allergies as of 05/17/2016   No Known Allergies     Medication List       Accurate as of  05/17/16  1:19 PM. Always use your most recent med list.          acetaminophen 325 MG tablet Commonly known as:  TYLENOL Take 650 mg by mouth every 6 (six) hours as needed for mild pain.   albuterol (2.5 MG/3ML) 0.083% nebulizer solution Commonly known as:  PROVENTIL INHALE CONTENTS OF 1 VIAL IN NEBULIZER EVERY 4 HOURS AS NEEDED FOR WHEEZING AND ASTHMA   allopurinol 300 MG tablet Commonly known as:  ZYLOPRIM TAKE 1 TABLET BY MOUTH EVERY DAY   amiodarone 200 MG tablet Commonly known as:  PACERONE TAKE 1 TABLET TWICE A DAY   apixaban 5 MG Tabs tablet Commonly known as:  ELIQUIS Take 1 tablet (5 mg total) by mouth 2 (two) times daily.   CENTRUM SILVER ADULT 50+ PO Take 1 tablet by mouth daily.   fluticasone 50 MCG/ACT nasal spray Commonly known as:  FLONASE Place 1 spray into both nostrils daily as needed for allergies.   fluticasone furoate-vilanterol 100-25 MCG/INH Aepb Commonly known as:  BREO ELLIPTA Inhale 1 puff into the lungs daily.   KLOR-CON M20 20 MEQ tablet Generic drug:  potassium chloride SA TAKE 1 TABLET  BY MOUTH EVERY DAY   levothyroxine 175 MCG tablet Commonly known as:  SYNTHROID, LEVOTHROID Take 1 tablet by mouth daily.   linagliptin 5 MG Tabs tablet Commonly known as:  TRADJENTA Take 1 tablet (5 mg total) by mouth daily.   montelukast 10 MG tablet Commonly known as:  SINGULAIR Take one tablet by mouth at bedtime as needed for allergies and asthma   pantoprazole 40 MG tablet Commonly known as:  PROTONIX Take 1 tablet (40 mg total) by mouth daily before supper.   pravastatin 20 MG tablet Commonly known as:  PRAVACHOL TAKE 1 TABLET BY MOUTH EVERY DAY WITH SUPPER   silver sulfADIAZINE 1 % cream Commonly known as:  SILVADENE Apply 1 application topically daily. To right ankle wound   torsemide 20 MG tablet Commonly known as:  DEMADEX Take 1 tablet (20 mg total) by mouth 2 (two) times daily.      Review of Systems:  Review of Systems    Constitutional: Negative for chills, fever and malaise/fatigue.  HENT: Negative for congestion.   Eyes: Negative for blurred vision.  Respiratory: Positive for cough and sputum production. Negative for hemoptysis, shortness of breath and wheezing.   Cardiovascular: Positive for leg swelling. Negative for chest pain and palpitations.       Edema tremendously improved  Gastrointestinal: Negative for abdominal pain, blood in stool, constipation, diarrhea and melena.  Genitourinary: Negative for dysuria.  Musculoskeletal: Negative for falls and myalgias.  Skin: Negative for itching and rash.  Neurological: Negative for dizziness, loss of consciousness and weakness.  Endo/Heme/Allergies: Does not bruise/bleed easily.  Psychiatric/Behavioral: Positive for memory loss. Negative for depression. The patient is not nervous/anxious and does not have insomnia.     Health Maintenance  Topic Date Due  . URINE MICROALBUMIN  05/29/2016  . INFLUENZA VACCINE  09/05/2016  . HEMOGLOBIN A1C  09/25/2016  . OPHTHALMOLOGY EXAM  12/12/2016  . FOOT EXAM  03/02/2017  . TETANUS/TDAP  02/05/2021  . PNA vac Low Risk Adult  Completed    Physical Exam: Vitals:   05/17/16 1308  BP: 138/80  Pulse: 71  Temp: 97.7 F (36.5 C)  TempSrc: Oral  SpO2: (!) 89%  Weight: 236 lb (107 kg)   Body mass index is 39.27 kg/m. Physical Exam  Constitutional: He is oriented to person, place, and time. He appears well-developed and well-nourished. No distress.  Cardiovascular: Normal rate, regular rhythm and normal heart sounds.   Diminished pulses b/l; edema improved  Pulmonary/Chest: No respiratory distress. He has wheezes. He has no rales.  Was tachypneic upon arrival with hypoxia at 89%RA (see hpi)  Musculoskeletal: Normal range of motion.  Neurological: He is alert and oriented to person, place, and time.  Skin: Skin is warm and dry.  Ulceration of right medial ankle with yellow and dark brown eschar over it and  tender in surrounding area, diminished pulses b/l   Psychiatric: He has a normal mood and affect.    Labs reviewed: Basic Metabolic Panel:  Recent Labs  01/17/16 1146 03/01/16 0819 03/28/16 1015  04/11/16 0516 04/12/16 0406 04/23/16 0939  NA 142 142 141  < > 137 137 142  K 4.0 3.9 4.4  < > 4.4 4.5 4.6  CL 103 98 97*  < > 96* 96* 100  CO2 31 33* 35*  < > 32 33* 35*  GLUCOSE 85 87 78  < > 85 76 102*  BUN 18 32* 30*  < > 38* 41* 32*  CREATININE 1.76*  1.97* 2.19*  < > 2.22* 2.10* 1.93*  CALCIUM 9.2 9.3 8.6  < > 9.0 8.9 9.1  TSH 4.92* 5.82* 5.71*  --   --   --   --   < > = values in this interval not displayed. Liver Function Tests:  Recent Labs  10/28/15 0938 03/01/16 0819 03/28/16 1015  AST 30 40* 34  ALT _0 ALKPHOS 64 113 123*  BILITOT 0.8 0.8 0.9  PROT 7.8 7.5 7.4  ALBUMIN 4.2 4.2 4.1   No results for input(s): LIPASE, AMYLASE in the last 8760 hours. No results for input(s): AMMONIA in the last 8760 hours. CBC:  Recent Labs  10/28/15 0938 11/03/15 1006 03/01/16 0819  04/09/16 1227 04/10/16 0456 04/11/16 0516  WBC 4.9 5.9 3.6*  < > 5.4 4.5 4.3  NEUTROABS 3.7 4,130 2,160  --   --   --   --   HGB 11.5* 12.1* 13.0*  < > 12.0* 11.8* 11.7*  HCT 38.0* 37.2* 40.9  < > 40.0 38.6* 37.9*  MCV 100.8* 94.7 95.1  < > 100.0 98.5 97.2  PLT 168 240 212  < > 147* 164 164  < > = values in this interval not displayed. Lipid Panel:  Recent Labs  08/24/15 0825 03/01/16 0819 03/28/16 1015  CHOL 163 161 162  HDL 83 69 84  LDLCALC 69 80 70  TRIG 55 62 41  CHOLHDL 2.0 2.3 1.9   Lab Results  Component Value Date   HGBA1C 5.9 (H) 03/28/2016    Assessment/Plan 1. Venous ulcer of ankle, right (HCC) - this has not been healing and is giving him more pain, there is no odor or drainage - advised to use santyl to the center of wound and obtain arterial doppler with ABIs to determine status of arterial circulation  - collagenase (SANTYL) ointment; Apply 1  application topically daily. To right ankle yellow and brown tissue in middle of ulcer  Dispense: 30 g; Refill: 0 - VAS [place] ABI WITH/WO TBI; Future  2. Chronic diastolic CHF (congestive heart failure), NYHA class 2 (HCC) - cont torsemide and kcl  -weight improved, rales resolved, but still mildly hypoxic on exertion with cough and some sputum production off and on (not using breo) so suspect some of his hypoxia is pulmonary not cardiac - Basic metabolic panel  3. Chronic kidney disease (CKD) stage G3b/A2, moderately decreased glomerular filtration rate (GFR) between 30-44 mL/min/1.73 square meter and albuminuria creatinine ratio between 30-299 mg/g - f/u labs today to reassess  - Basic metabolic panel  4. COPD GOLD II -given breo samples x 3 today -cont using albuterol up to 4x per day nebulized and singulair -f/u with pulmonary due to recent increase hypoxia on exertion  -last seen by Dr. Melvyn Novas 12/25/12 when he barely met COPD criteria but did improve with breo--has difficulty affording   Labs/tests ordered:   Orders Placed This Encounter  Procedures  . Basic metabolic panel    Order Specific Question:   Has the patient fasted?    Answer:   No  arterial doppler with abis  Next appt:  06/29/2016 for med mgt  Tariana Moldovan L. Indie Nickerson, D.O. [facility] [address], [place] [address] Cell Phone (Mon-Fri 8am-5pm):  [phone] On Call:  [phone] & follow prompts after 5pm & weekends Office Phone:  [phone] Office Fax:  [phone]

## 2016-05-17 NOTE — Addendum Note (Signed)
Addended by: Despina Hidden on: 05/17/2016 02:53 PM   Modules accepted: Orders

## 2016-05-18 ENCOUNTER — Encounter: Payer: Self-pay | Admitting: *Deleted

## 2016-05-18 ENCOUNTER — Other Ambulatory Visit: Payer: Self-pay | Admitting: Internal Medicine

## 2016-05-18 DIAGNOSIS — Z9989 Dependence on other enabling machines and devices: Secondary | ICD-10-CM

## 2016-05-18 DIAGNOSIS — J810 Acute pulmonary edema: Secondary | ICD-10-CM

## 2016-05-18 DIAGNOSIS — N183 Chronic kidney disease, stage 3 (moderate): Principal | ICD-10-CM

## 2016-05-18 DIAGNOSIS — J449 Chronic obstructive pulmonary disease, unspecified: Secondary | ICD-10-CM

## 2016-05-18 DIAGNOSIS — G4733 Obstructive sleep apnea (adult) (pediatric): Secondary | ICD-10-CM

## 2016-05-18 DIAGNOSIS — N1832 Chronic kidney disease, stage 3b: Secondary | ICD-10-CM

## 2016-05-18 LAB — BASIC METABOLIC PANEL
BUN: 44 mg/dL — ABNORMAL HIGH (ref 7–25)
CO2: 29 mmol/L (ref 20–31)
Calcium: 9.1 mg/dL (ref 8.6–10.3)
Chloride: 99 mmol/L (ref 98–110)
Creat: 2.53 mg/dL — ABNORMAL HIGH (ref 0.70–1.18)
Glucose, Bld: 90 mg/dL (ref 65–99)
Potassium: 4 mmol/L (ref 3.5–5.3)
Sodium: 143 mmol/L (ref 135–146)

## 2016-05-19 ENCOUNTER — Other Ambulatory Visit: Payer: Self-pay | Admitting: Cardiovascular Disease

## 2016-05-23 ENCOUNTER — Other Ambulatory Visit: Payer: Self-pay | Admitting: Internal Medicine

## 2016-05-25 ENCOUNTER — Other Ambulatory Visit: Payer: Self-pay | Admitting: Internal Medicine

## 2016-05-28 ENCOUNTER — Ambulatory Visit (INDEPENDENT_AMBULATORY_CARE_PROVIDER_SITE_OTHER)
Admission: RE | Admit: 2016-05-28 | Discharge: 2016-05-28 | Disposition: A | Discharge status: Home / Self Care | Payer: Medicare HMO | Source: Ambulatory Visit | Attending: Surgery | Admitting: Surgery

## 2016-05-28 ENCOUNTER — Encounter (HOSPITAL_COMMUNITY): Payer: Self-pay

## 2016-05-28 ENCOUNTER — Emergency Department (HOSPITAL_COMMUNITY)
Admission: EM | Admit: 2016-05-28 | Discharge: 2016-05-28 | Disposition: A | Discharge status: Home / Self Care | Payer: Medicare HMO | Attending: Emergency Medicine | Admitting: Emergency Medicine

## 2016-05-28 DIAGNOSIS — E11622 Type 2 diabetes mellitus with other skin ulcer: Secondary | ICD-10-CM

## 2016-05-28 DIAGNOSIS — J449 Chronic obstructive pulmonary disease, unspecified: Secondary | ICD-10-CM | POA: Diagnosis not present

## 2016-05-28 DIAGNOSIS — Z87891 Personal history of nicotine dependence: Secondary | ICD-10-CM | POA: Diagnosis not present

## 2016-05-28 DIAGNOSIS — I129 Hypertensive chronic kidney disease with stage 1 through stage 4 chronic kidney disease, or unspecified chronic kidney disease: Secondary | ICD-10-CM | POA: Diagnosis not present

## 2016-05-28 DIAGNOSIS — R233 Spontaneous ecchymoses: Secondary | ICD-10-CM | POA: Diagnosis not present

## 2016-05-28 DIAGNOSIS — Y999 Unspecified external cause status: Secondary | ICD-10-CM | POA: Insufficient documentation

## 2016-05-28 DIAGNOSIS — I509 Heart failure, unspecified: Secondary | ICD-10-CM | POA: Diagnosis not present

## 2016-05-28 DIAGNOSIS — E1122 Type 2 diabetes mellitus with diabetic chronic kidney disease: Secondary | ICD-10-CM | POA: Diagnosis not present

## 2016-05-28 DIAGNOSIS — I130 Hypertensive heart and chronic kidney disease with heart failure and stage 1 through stage 4 chronic kidney disease, or unspecified chronic kidney disease: Secondary | ICD-10-CM | POA: Insufficient documentation

## 2016-05-28 DIAGNOSIS — Z7901 Long term (current) use of anticoagulants: Secondary | ICD-10-CM | POA: Diagnosis not present

## 2016-05-28 DIAGNOSIS — E039 Hypothyroidism, unspecified: Secondary | ICD-10-CM | POA: Diagnosis not present

## 2016-05-28 DIAGNOSIS — Y939 Activity, unspecified: Secondary | ICD-10-CM | POA: Insufficient documentation

## 2016-05-28 DIAGNOSIS — I83013 Varicose veins of right lower extremity with ulcer of ankle: Secondary | ICD-10-CM | POA: Diagnosis not present

## 2016-05-28 DIAGNOSIS — L97309 Non-pressure chronic ulcer of unspecified ankle with unspecified severity: Secondary | ICD-10-CM

## 2016-05-28 DIAGNOSIS — X58XXXA Exposure to other specified factors, initial encounter: Secondary | ICD-10-CM | POA: Diagnosis not present

## 2016-05-28 DIAGNOSIS — Y929 Unspecified place or not applicable: Secondary | ICD-10-CM | POA: Insufficient documentation

## 2016-05-28 DIAGNOSIS — L97319 Non-pressure chronic ulcer of right ankle with unspecified severity: Secondary | ICD-10-CM | POA: Insufficient documentation

## 2016-05-28 DIAGNOSIS — S99921A Unspecified injury of right foot, initial encounter: Secondary | ICD-10-CM | POA: Diagnosis present

## 2016-05-28 DIAGNOSIS — Z79899 Other long term (current) drug therapy: Secondary | ICD-10-CM | POA: Insufficient documentation

## 2016-05-28 DIAGNOSIS — N184 Chronic kidney disease, stage 4 (severe): Secondary | ICD-10-CM

## 2016-05-28 DIAGNOSIS — R58 Hemorrhage, not elsewhere classified: Secondary | ICD-10-CM

## 2016-05-28 LAB — BASIC METABOLIC PANEL
ANION GAP: 10 (ref 5–15)
BUN: 58 mg/dL — AB (ref 6–20)
CALCIUM: 8.8 mg/dL — AB (ref 8.9–10.3)
CO2: 29 mmol/L (ref 22–32)
CREATININE: 2.97 mg/dL — AB (ref 0.61–1.24)
Chloride: 101 mmol/L (ref 101–111)
GFR calc Af Amer: 22 mL/min — ABNORMAL LOW (ref >60)
GFR, EST NON AFRICAN AMERICAN: 19 mL/min — AB (ref >60)
GLUCOSE: 119 mg/dL — AB (ref 65–99)
Potassium: 4 mmol/L (ref 3.5–5.1)
Sodium: 140 mmol/L (ref 135–145)

## 2016-05-28 LAB — CBC WITH DIFFERENTIAL/PLATELET
BASOS ABS: 0 10*3/uL (ref 0.0–0.1)
BASOS PCT: 0 %
EOS PCT: 1 %
Eosinophils Absolute: 0.1 10*3/uL (ref 0.0–0.7)
HEMATOCRIT: 34.8 % — AB (ref 39.0–52.0)
Hemoglobin: 10.7 g/dL — ABNORMAL LOW (ref 13.0–17.0)
Lymphocytes Relative: 20 %
Lymphs Abs: 0.9 10*3/uL (ref 0.7–4.0)
MCH: 30.5 pg (ref 26.0–34.0)
MCHC: 30.7 g/dL (ref 30.0–36.0)
MCV: 99.1 fL (ref 78.0–100.0)
MONO ABS: 0.4 10*3/uL (ref 0.1–1.0)
MONOS PCT: 8 %
NEUTROS ABS: 3.2 10*3/uL (ref 1.7–7.7)
Neutrophils Relative %: 71 %
PLATELETS: 160 10*3/uL (ref 150–400)
RBC: 3.51 MIL/uL — ABNORMAL LOW (ref 4.22–5.81)
RDW: 16.8 % — ABNORMAL HIGH (ref 11.5–15.5)
WBC: 4.5 10*3/uL (ref 4.0–10.5)

## 2016-05-28 NOTE — ED Provider Notes (Signed)
[facility] DEPT Provider Note   CSN: [id number] Arrival date & time: 05/28/16  1623     History   Chief Complaint Chief Complaint  Patient presents with  . Foot Injury    HPI Omar Lambert is a 76 y.o. male.  The history is provided by the patient. No language interpreter was used.  Foot Injury     Omar Lambert is a 76 y.o. male who presents to the Emergency Department complaining of foot injury. He was at home this evening when he kneeled on the floor to do one if his routine nebulizer treatments. When he got up there was a large amount of blood on the floor and at his right ankle. He does take Eliquis for his heart. He denies any injury or pain to the area. He did see a vascular doctor today for testing of his right leg. He denies any fevers, chest pain, shortness of breath. Past Medical History:  Diagnosis Date  . Allergy   . Asthma   . Atrophic gastritis   . Benign essential hypertension   . CHF (congestive heart failure) ([place])   . COPD (chronic obstructive pulmonary disease) ([place])   . Diabetes ([place])   . GERD (gastroesophageal reflux disease)   . Gout attack 09/2012  . Sinusitis, maxillary, chronic   . Type II or unspecified type diabetes mellitus without mention of complication, not stated as uncontrolled     Patient Active Problem List   Diagnosis Date Noted  . DOE (dyspnea on exertion) 04/10/2016  . Dyspnea on exertion 04/09/2016  . CAP (community acquired pneumonia) 04/09/2016  . Diabetes mellitus with complication ([place])   . Lobar pneumonia ([place])   . Venous ulcer of ankle, right ([place]) 03/29/2016  . Hypothyroidism 03/29/2016  . Acute CHF ([place]) 10/29/2015  . Cough 10/28/2015  . CHF exacerbation ([place]) 10/28/2015  . Pulmonary edema 08/18/2015  . Acute diastolic CHF (congestive heart failure) ([place]) 08/18/2015  . Essential hypertension, benign 02/28/2015  . 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 Morbid obesity due to excess calories ([place]) 02/28/2015  .  Hyperlipidemia associated with type 2 diabetes mellitus ([place]) 02/28/2015  . Persistent atrial fibrillation ([place]) 12/18/2014  . Obesity (BMI 30-39.9) 12/18/2014  . Alteration in anticoagulation 12/18/2014  . Atrial flutter ([place]) 11/12/2014  . DM type 2, goal HbA1c < 7% (HCC) 08/19/2014  . Chronic kidney disease (CKD) stage G3b/A2, moderately decreased glomerular filtration rate (GFR) between 30-44 mL/min/1.73 square meter and albuminuria creatinine ratio between 30-299 mg/g 08/19/2014  . Common wart 08/19/2014  . Noncompliance with medication regimen 02/15/2014  . Environmental allergies 02/15/2014  . 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 Belching 09/18/2013  . Esophageal reflux 09/18/2013  . Acute gout 06/08/2013  . Diabetes mellitus type II, controlled ([place]) 06/08/2013  . Venous insufficiency of both lower extremities 03/14/2013  . OSA on CPAP 12/22/2012  . COPD GOLD II 11/26/2012  . Severe obesity (BMI >= 40) ([place]) 10/16/2012  . Sinusitis, maxillary, chronic   . Asthma   . Allergy   . Benign essential hypertension   . Gout attack 09/05/2012    Past Surgical History:  Procedure Laterality Date  . COLONOSCOPY  2003  . HERNIA REPAIR  1980       Home Medications    Prior to Admission medications   Medication Sig Start Date End Date Taking? Authorizing Provider  acetaminophen (TYLENOL) 325 MG tablet Take 650 mg by mouth every 6 (six) hours as needed for mild pain.  Historical Provider, MD  albuterol (PROVENTIL) (2.5 MG/3ML) 0.083% nebulizer solution INHALE CONTENTS OF 1 VIAL IN NEBULIZER EVERY 4 HOURS AS NEEDED FOR WHEEZING AND ASTHMA 05/23/16   [name] L [name], DO  allopurinol (ZYLOPRIM) 300 MG tablet TAKE 1 TABLET BY MOUTH EVERY DAY 11/21/15   Gildardo Cranker, DO  amiodarone (PACERONE) 200 MG tablet TAKE 1 TABLET TWICE A DAY 05/21/16   Troy Sine, MD  apixaban (ELIQUIS) 5 MG TABS tablet Take 1 tablet (5 mg total) by mouth 2 (two) times daily. 09/29/15   [name] L [name], DO    fluticasone (FLONASE) 50 MCG/ACT nasal spray Place 1 spray into both nostrils daily as needed for allergies.  10/25/15   Historical Provider, MD  fluticasone furoate-vilanterol (BREO ELLIPTA) 100-25 MCG/INH AEPB Inhale 1 puff into the lungs daily. 11/03/15   Lauree Chandler, NP  KLOR-CON M20 20 MEQ tablet TAKE 1 TABLET BY MOUTH EVERY DAY 04/09/16   [name] L [name], DO  levothyroxine (SYNTHROID, LEVOTHROID) 175 MCG tablet Take 1 tablet by mouth daily. 04/24/16   Historical Provider, MD  linagliptin (TRADJENTA) 5 MG TABS tablet Take 1 tablet (5 mg total) by mouth daily. 09/30/15   [name] L [name], DO  montelukast (SINGULAIR) 10 MG tablet Take one tablet by mouth at bedtime as needed for allergies and asthma 09/27/15   Lauree Chandler, NP  Multiple Vitamins-Minerals (CENTRUM SILVER ADULT 50+ PO) Take 1 tablet by mouth daily.    Historical Provider, MD  pantoprazole (PROTONIX) 40 MG tablet Take 1 tablet (40 mg total) by mouth daily before supper. 05/10/16   [name] L [name], DO  pravastatin (PRAVACHOL) 20 MG tablet TAKE 1 TABLET BY MOUTH EVERY DAY WITH SUPPER 03/20/16   [name] L [name], DO  silver sulfADIAZINE (SILVADENE) 1 % cream  03/29/16   Historical Provider, MD  torsemide (DEMADEX) 20 MG tablet TAKE 1 TABLET TWICE A DAY 05/25/16   Gayland Curry, DO    Family History Family History  Problem Relation Age of Onset  . Stroke Mother   . Cancer Sister   . Diabetes Sister   . COPD Sister   . COPD Sister     Social History Social History  Substance Use Topics  . Smoking status: Former Smoker    Packs/day: 1.00    Years: 20.00    Types: Cigarettes    Quit date: 06/17/1997  . Smokeless tobacco: Never Used  . Alcohol use No     Allergies   Patient has no known allergies.   Review of Systems Review of Systems  All other systems reviewed and are negative.    Physical Exam Updated Vital Signs BP (!) 147/70 (BP Location: Left Arm)   Pulse 64   Temp 97.2 F (36.2 C) (Oral)   Resp 18    SpO2 100%   Physical Exam  Constitutional: He is oriented to person, place, and time. He appears well-developed and well-nourished.  HENT:  Head: Normocephalic and atraumatic.  Cardiovascular: Normal rate and regular rhythm.   Pulmonary/Chest: Effort normal. No respiratory distress.  Musculoskeletal:  Pitting edema to bilateral lower extremities. Faint DP pulse to the right lower extremity. There is an ulcerated area to the right medial malleolus with evidence of recent bleeding, no active bleeding.  Neurological: He is alert and oriented to person, place, and time.  Skin: Skin is warm and dry.  Psychiatric: He has a normal mood and affect. His behavior is normal.  Nursing note and vitals reviewed.  ED Treatments / Results  Labs (all labs ordered are listed, but only abnormal results are displayed) Labs Reviewed  BASIC METABOLIC PANEL  CBC WITH DIFFERENTIAL/PLATELET    EKG  EKG Interpretation None       Radiology No results found.  Procedures Procedures (including critical care time)  Medications Ordered in ED Medications - No data to display   Initial Impression / Assessment and Plan / ED Course  I have reviewed the triage vital signs and the nursing notes.  Pertinent labs & imaging results that were available during my care of the patient were reviewed by me and considered in my medical decision making (see chart for details).     Patient here for evaluation of bleeding from his right foot. Bleeding stopped on ED arrival. He was monitored for 2 hours with no recurrent bleeding. The wound was cleansed and dressed with a Surgicel dressing to prevent recurrent bleeding. Patient was able to ambulate without any recurrent bleeding in the department. Counseled patient on home care for bleeding diabetic ulcer. No evidence of acute infection. BMP is with worsening renal function compared to priors. Counseled patient on important to PCP follow-up to have this rechecked in  the next few days. Outpatient follow-up and return precautions discussed.  Final Clinical Impressions(s) / ED Diagnoses   Final diagnoses:  Diabetic ankle ulcer ([place])  Bleeding  CKD (chronic kidney disease) stage 4, GFR 15-29 ml/min [facility])    New Prescriptions New Prescriptions   No medications on file     Quintella Reichert, MD 05/29/16 0009

## 2016-05-28 NOTE — Discharge Instructions (Signed)
Your kidney function is slightly worse when compared to priors. Please see your family doctor in the next 1-2 days for recheck. if you develop bleeding from your ankle again applied direct pressure to the area. You can elevate the leg. Get rechecked immediately if you develops shortness of breath, fevers or new, worrisome symptoms.

## 2016-05-28 NOTE — ED Triage Notes (Signed)
Pt has injury to his left foot. Pt has foot wrapped in plastic bag with moderate amount of blood noted in the bag. He is alert and oriented. Takes eloquis. Pt states he has a wound on his foot that began bleeding.

## 2016-05-28 NOTE — ED Notes (Signed)
Patient ambulated around hallways of pod a without difficulty or any new bleeding from wound site.

## 2016-05-28 NOTE — ED Notes (Signed)
EDP at bedside  

## 2016-05-29 ENCOUNTER — Other Ambulatory Visit: Payer: Medicare HMO

## 2016-05-31 ENCOUNTER — Telehealth: Payer: Self-pay

## 2016-05-31 NOTE — Telephone Encounter (Signed)
Ahmeer Tuman called and left message on clinical intake voicemail requesting a referral to a skin or wound specialist for ankle ulcer.  Per Earlie Server ulcer is worse (seen in ED on 05/28/16), I called [name] and left message for her to call to discuss if patient with drainage, fever, or odor, or what exactly is worse.

## 2016-06-01 ENCOUNTER — Ambulatory Visit: Payer: Medicare HMO | Admitting: Internal Medicine

## 2016-06-05 DIAGNOSIS — N183 Chronic kidney disease, stage 3 (moderate): Secondary | ICD-10-CM | POA: Diagnosis not present

## 2016-06-05 DIAGNOSIS — N2581 Secondary hyperparathyroidism of renal origin: Secondary | ICD-10-CM | POA: Diagnosis not present

## 2016-06-05 DIAGNOSIS — D631 Anemia in chronic kidney disease: Secondary | ICD-10-CM | POA: Diagnosis not present

## 2016-06-05 DIAGNOSIS — E1129 Type 2 diabetes mellitus with other diabetic kidney complication: Secondary | ICD-10-CM | POA: Diagnosis not present

## 2016-06-05 DIAGNOSIS — N184 Chronic kidney disease, stage 4 (severe): Secondary | ICD-10-CM | POA: Diagnosis not present

## 2016-06-05 DIAGNOSIS — I4891 Unspecified atrial fibrillation: Secondary | ICD-10-CM | POA: Diagnosis not present

## 2016-06-05 DIAGNOSIS — I509 Heart failure, unspecified: Secondary | ICD-10-CM | POA: Diagnosis not present

## 2016-06-05 DIAGNOSIS — M109 Gout, unspecified: Secondary | ICD-10-CM | POA: Diagnosis not present

## 2016-06-05 DIAGNOSIS — I129 Hypertensive chronic kidney disease with stage 1 through stage 4 chronic kidney disease, or unspecified chronic kidney disease: Secondary | ICD-10-CM | POA: Diagnosis not present

## 2016-06-05 DIAGNOSIS — G4733 Obstructive sleep apnea (adult) (pediatric): Secondary | ICD-10-CM | POA: Diagnosis not present

## 2016-06-06 NOTE — Telephone Encounter (Signed)
Called and spoke with Omar Lambert and she said to disregard the message that she has an appointment scheduled for patient to see [place].

## 2016-06-06 NOTE — Telephone Encounter (Signed)
Please follow-up with Earlie Server, she never called back to further discuss wound

## 2016-06-08 ENCOUNTER — Other Ambulatory Visit: Payer: Self-pay | Admitting: Nephrology

## 2016-06-08 DIAGNOSIS — N184 Chronic kidney disease, stage 4 (severe): Secondary | ICD-10-CM

## 2016-06-13 ENCOUNTER — Encounter (HOSPITAL_BASED_OUTPATIENT_CLINIC_OR_DEPARTMENT_OTHER): Payer: Medicare HMO | Attending: Surgery

## 2016-06-13 DIAGNOSIS — I87311 Chronic venous hypertension (idiopathic) with ulcer of right lower extremity: Secondary | ICD-10-CM | POA: Insufficient documentation

## 2016-06-13 DIAGNOSIS — I4892 Unspecified atrial flutter: Secondary | ICD-10-CM | POA: Diagnosis not present

## 2016-06-13 DIAGNOSIS — I509 Heart failure, unspecified: Secondary | ICD-10-CM | POA: Diagnosis not present

## 2016-06-13 DIAGNOSIS — M109 Gout, unspecified: Secondary | ICD-10-CM | POA: Diagnosis not present

## 2016-06-13 DIAGNOSIS — J449 Chronic obstructive pulmonary disease, unspecified: Secondary | ICD-10-CM | POA: Diagnosis not present

## 2016-06-13 DIAGNOSIS — E11622 Type 2 diabetes mellitus with other skin ulcer: Secondary | ICD-10-CM | POA: Insufficient documentation

## 2016-06-13 DIAGNOSIS — L97311 Non-pressure chronic ulcer of right ankle limited to breakdown of skin: Secondary | ICD-10-CM | POA: Diagnosis not present

## 2016-06-13 DIAGNOSIS — M1990 Unspecified osteoarthritis, unspecified site: Secondary | ICD-10-CM | POA: Insufficient documentation

## 2016-06-13 DIAGNOSIS — G4730 Sleep apnea, unspecified: Secondary | ICD-10-CM | POA: Diagnosis not present

## 2016-06-13 DIAGNOSIS — L97312 Non-pressure chronic ulcer of right ankle with fat layer exposed: Secondary | ICD-10-CM | POA: Diagnosis not present

## 2016-06-13 DIAGNOSIS — I110 Hypertensive heart disease with heart failure: Secondary | ICD-10-CM | POA: Insufficient documentation

## 2016-06-17 ENCOUNTER — Other Ambulatory Visit: Payer: Self-pay | Admitting: Internal Medicine

## 2016-06-17 DIAGNOSIS — M1A9XX0 Chronic gout, unspecified, without tophus (tophi): Secondary | ICD-10-CM

## 2016-06-19 ENCOUNTER — Ambulatory Visit
Admission: RE | Admit: 2016-06-19 | Discharge: 2016-06-19 | Disposition: A | Discharge status: Home / Self Care | Payer: Medicare HMO | Source: Ambulatory Visit | Attending: Nephrology | Admitting: Nephrology

## 2016-06-19 DIAGNOSIS — N184 Chronic kidney disease, stage 4 (severe): Secondary | ICD-10-CM | POA: Diagnosis not present

## 2016-06-20 ENCOUNTER — Other Ambulatory Visit: Payer: Self-pay | Admitting: Internal Medicine

## 2016-06-20 DIAGNOSIS — G4730 Sleep apnea, unspecified: Secondary | ICD-10-CM | POA: Diagnosis not present

## 2016-06-20 DIAGNOSIS — E11622 Type 2 diabetes mellitus with other skin ulcer: Secondary | ICD-10-CM | POA: Diagnosis not present

## 2016-06-20 DIAGNOSIS — I509 Heart failure, unspecified: Secondary | ICD-10-CM | POA: Diagnosis not present

## 2016-06-20 DIAGNOSIS — I87311 Chronic venous hypertension (idiopathic) with ulcer of right lower extremity: Secondary | ICD-10-CM | POA: Diagnosis not present

## 2016-06-20 DIAGNOSIS — J449 Chronic obstructive pulmonary disease, unspecified: Secondary | ICD-10-CM | POA: Diagnosis not present

## 2016-06-20 DIAGNOSIS — I110 Hypertensive heart disease with heart failure: Secondary | ICD-10-CM | POA: Diagnosis not present

## 2016-06-20 DIAGNOSIS — I4892 Unspecified atrial flutter: Secondary | ICD-10-CM | POA: Diagnosis not present

## 2016-06-20 DIAGNOSIS — M109 Gout, unspecified: Secondary | ICD-10-CM | POA: Diagnosis not present

## 2016-06-20 DIAGNOSIS — L97312 Non-pressure chronic ulcer of right ankle with fat layer exposed: Secondary | ICD-10-CM | POA: Diagnosis not present

## 2016-06-21 ENCOUNTER — Other Ambulatory Visit: Payer: Self-pay | Admitting: Internal Medicine

## 2016-06-21 ENCOUNTER — Institutional Professional Consult (permissible substitution): Payer: Medicare HMO | Admitting: Internal Medicine

## 2016-06-21 DIAGNOSIS — K219 Gastro-esophageal reflux disease without esophagitis: Secondary | ICD-10-CM

## 2016-06-27 DIAGNOSIS — I4892 Unspecified atrial flutter: Secondary | ICD-10-CM | POA: Diagnosis not present

## 2016-06-27 DIAGNOSIS — I509 Heart failure, unspecified: Secondary | ICD-10-CM | POA: Diagnosis not present

## 2016-06-27 DIAGNOSIS — E11622 Type 2 diabetes mellitus with other skin ulcer: Secondary | ICD-10-CM | POA: Diagnosis not present

## 2016-06-27 DIAGNOSIS — I110 Hypertensive heart disease with heart failure: Secondary | ICD-10-CM | POA: Diagnosis not present

## 2016-06-27 DIAGNOSIS — J449 Chronic obstructive pulmonary disease, unspecified: Secondary | ICD-10-CM | POA: Diagnosis not present

## 2016-06-27 DIAGNOSIS — G4730 Sleep apnea, unspecified: Secondary | ICD-10-CM | POA: Diagnosis not present

## 2016-06-27 DIAGNOSIS — L97312 Non-pressure chronic ulcer of right ankle with fat layer exposed: Secondary | ICD-10-CM | POA: Diagnosis not present

## 2016-06-27 DIAGNOSIS — I87311 Chronic venous hypertension (idiopathic) with ulcer of right lower extremity: Secondary | ICD-10-CM | POA: Diagnosis not present

## 2016-06-27 DIAGNOSIS — M109 Gout, unspecified: Secondary | ICD-10-CM | POA: Diagnosis not present

## 2016-06-29 ENCOUNTER — Ambulatory Visit: Payer: Medicare HMO | Admitting: Internal Medicine

## 2016-07-03 ENCOUNTER — Other Ambulatory Visit: Payer: Self-pay | Admitting: Internal Medicine

## 2016-07-04 DIAGNOSIS — E11622 Type 2 diabetes mellitus with other skin ulcer: Secondary | ICD-10-CM | POA: Diagnosis not present

## 2016-07-04 DIAGNOSIS — M109 Gout, unspecified: Secondary | ICD-10-CM | POA: Diagnosis not present

## 2016-07-04 DIAGNOSIS — I110 Hypertensive heart disease with heart failure: Secondary | ICD-10-CM | POA: Diagnosis not present

## 2016-07-04 DIAGNOSIS — I87311 Chronic venous hypertension (idiopathic) with ulcer of right lower extremity: Secondary | ICD-10-CM | POA: Diagnosis not present

## 2016-07-04 DIAGNOSIS — G4730 Sleep apnea, unspecified: Secondary | ICD-10-CM | POA: Diagnosis not present

## 2016-07-04 DIAGNOSIS — I509 Heart failure, unspecified: Secondary | ICD-10-CM | POA: Diagnosis not present

## 2016-07-04 DIAGNOSIS — I4892 Unspecified atrial flutter: Secondary | ICD-10-CM | POA: Diagnosis not present

## 2016-07-04 DIAGNOSIS — L97312 Non-pressure chronic ulcer of right ankle with fat layer exposed: Secondary | ICD-10-CM | POA: Diagnosis not present

## 2016-07-04 DIAGNOSIS — J449 Chronic obstructive pulmonary disease, unspecified: Secondary | ICD-10-CM | POA: Diagnosis not present

## 2016-07-10 ENCOUNTER — Telehealth: Payer: Self-pay | Admitting: Cardiovascular Disease

## 2016-07-10 DIAGNOSIS — D631 Anemia in chronic kidney disease: Secondary | ICD-10-CM | POA: Diagnosis not present

## 2016-07-10 DIAGNOSIS — I129 Hypertensive chronic kidney disease with stage 1 through stage 4 chronic kidney disease, or unspecified chronic kidney disease: Secondary | ICD-10-CM | POA: Diagnosis not present

## 2016-07-10 DIAGNOSIS — N184 Chronic kidney disease, stage 4 (severe): Secondary | ICD-10-CM | POA: Diagnosis not present

## 2016-07-10 DIAGNOSIS — I509 Heart failure, unspecified: Secondary | ICD-10-CM | POA: Diagnosis not present

## 2016-07-10 DIAGNOSIS — E1129 Type 2 diabetes mellitus with other diabetic kidney complication: Secondary | ICD-10-CM | POA: Diagnosis not present

## 2016-07-10 DIAGNOSIS — M109 Gout, unspecified: Secondary | ICD-10-CM | POA: Diagnosis not present

## 2016-07-10 DIAGNOSIS — Z9989 Dependence on other enabling machines and devices: Secondary | ICD-10-CM | POA: Diagnosis not present

## 2016-07-10 DIAGNOSIS — I4891 Unspecified atrial fibrillation: Secondary | ICD-10-CM | POA: Diagnosis not present

## 2016-07-10 DIAGNOSIS — N2581 Secondary hyperparathyroidism of renal origin: Secondary | ICD-10-CM | POA: Diagnosis not present

## 2016-07-10 NOTE — Telephone Encounter (Signed)
Spoke with pt, he recently saw [facility] and they told him his blood was low and he needed to follow up with [name]. He denies active bleeding or dark, tary stools. Follow up scheduled with hao meng pa.

## 2016-07-10 NOTE — Telephone Encounter (Signed)
Patient calling, states that he was informed that his blood was low and was advised to see Dr. Claiborne Billings ASAP. Dr. Claiborne Billings did not have any openings, I offered appt with an APP, patient declined and wanted to wait for Dr. Claiborne Billings. Thanks.

## 2016-07-11 ENCOUNTER — Encounter (HOSPITAL_BASED_OUTPATIENT_CLINIC_OR_DEPARTMENT_OTHER): Payer: Medicare HMO | Attending: Surgery

## 2016-07-11 DIAGNOSIS — I87311 Chronic venous hypertension (idiopathic) with ulcer of right lower extremity: Secondary | ICD-10-CM | POA: Insufficient documentation

## 2016-07-11 DIAGNOSIS — J449 Chronic obstructive pulmonary disease, unspecified: Secondary | ICD-10-CM | POA: Insufficient documentation

## 2016-07-11 DIAGNOSIS — I110 Hypertensive heart disease with heart failure: Secondary | ICD-10-CM | POA: Insufficient documentation

## 2016-07-11 DIAGNOSIS — I509 Heart failure, unspecified: Secondary | ICD-10-CM | POA: Diagnosis not present

## 2016-07-11 DIAGNOSIS — L97312 Non-pressure chronic ulcer of right ankle with fat layer exposed: Secondary | ICD-10-CM | POA: Insufficient documentation

## 2016-07-11 DIAGNOSIS — E11622 Type 2 diabetes mellitus with other skin ulcer: Secondary | ICD-10-CM | POA: Insufficient documentation

## 2016-07-11 DIAGNOSIS — E1151 Type 2 diabetes mellitus with diabetic peripheral angiopathy without gangrene: Secondary | ICD-10-CM | POA: Diagnosis not present

## 2016-07-11 DIAGNOSIS — G4730 Sleep apnea, unspecified: Secondary | ICD-10-CM | POA: Insufficient documentation

## 2016-07-12 ENCOUNTER — Telehealth: Payer: Self-pay | Admitting: Student

## 2016-07-12 NOTE — Telephone Encounter (Signed)
Received records from [facility] for appointment on 07/23/16 with Bernerd Pho, [place].  Records put with Brittany's schedule for 07/23/16. lp

## 2016-07-18 DIAGNOSIS — I87311 Chronic venous hypertension (idiopathic) with ulcer of right lower extremity: Secondary | ICD-10-CM | POA: Diagnosis not present

## 2016-07-18 DIAGNOSIS — L97312 Non-pressure chronic ulcer of right ankle with fat layer exposed: Secondary | ICD-10-CM | POA: Diagnosis not present

## 2016-07-18 DIAGNOSIS — I110 Hypertensive heart disease with heart failure: Secondary | ICD-10-CM | POA: Diagnosis not present

## 2016-07-18 DIAGNOSIS — I509 Heart failure, unspecified: Secondary | ICD-10-CM | POA: Diagnosis not present

## 2016-07-18 DIAGNOSIS — J449 Chronic obstructive pulmonary disease, unspecified: Secondary | ICD-10-CM | POA: Diagnosis not present

## 2016-07-18 DIAGNOSIS — E1151 Type 2 diabetes mellitus with diabetic peripheral angiopathy without gangrene: Secondary | ICD-10-CM | POA: Diagnosis not present

## 2016-07-18 DIAGNOSIS — G4730 Sleep apnea, unspecified: Secondary | ICD-10-CM | POA: Diagnosis not present

## 2016-07-18 DIAGNOSIS — E11622 Type 2 diabetes mellitus with other skin ulcer: Secondary | ICD-10-CM | POA: Diagnosis not present

## 2016-07-20 ENCOUNTER — Encounter: Payer: Self-pay | Admitting: Internal Medicine

## 2016-07-20 ENCOUNTER — Ambulatory Visit (INDEPENDENT_AMBULATORY_CARE_PROVIDER_SITE_OTHER): Payer: Medicare HMO | Admitting: Internal Medicine

## 2016-07-20 VITALS — BP 130/80 | HR 88 | Temp 97.5°F | Wt 225.0 lb

## 2016-07-20 DIAGNOSIS — E039 Hypothyroidism, unspecified: Secondary | ICD-10-CM

## 2016-07-20 DIAGNOSIS — G4733 Obstructive sleep apnea (adult) (pediatric): Secondary | ICD-10-CM | POA: Diagnosis not present

## 2016-07-20 DIAGNOSIS — I5032 Chronic diastolic (congestive) heart failure: Secondary | ICD-10-CM | POA: Diagnosis not present

## 2016-07-20 DIAGNOSIS — L97319 Non-pressure chronic ulcer of right ankle with unspecified severity: Secondary | ICD-10-CM

## 2016-07-20 DIAGNOSIS — J449 Chronic obstructive pulmonary disease, unspecified: Secondary | ICD-10-CM

## 2016-07-20 DIAGNOSIS — E1122 Type 2 diabetes mellitus with diabetic chronic kidney disease: Secondary | ICD-10-CM | POA: Diagnosis not present

## 2016-07-20 DIAGNOSIS — I481 Persistent atrial fibrillation: Secondary | ICD-10-CM

## 2016-07-20 DIAGNOSIS — I83013 Varicose veins of right lower extremity with ulcer of ankle: Secondary | ICD-10-CM | POA: Diagnosis not present

## 2016-07-20 DIAGNOSIS — N183 Chronic kidney disease, stage 3 unspecified: Secondary | ICD-10-CM

## 2016-07-20 DIAGNOSIS — N1832 Chronic kidney disease, stage 3b: Secondary | ICD-10-CM

## 2016-07-20 DIAGNOSIS — Z9989 Dependence on other enabling machines and devices: Secondary | ICD-10-CM

## 2016-07-20 DIAGNOSIS — I4819 Other persistent atrial fibrillation: Secondary | ICD-10-CM

## 2016-07-20 LAB — COMPLETE METABOLIC PANEL WITH GFR
ALT: 25 U/L (ref 9–46)
AST: 29 U/L (ref 10–35)
Albumin: 4 g/dL (ref 3.6–5.1)
Alkaline Phosphatase: 163 U/L — ABNORMAL HIGH (ref 40–115)
BUN: 59 mg/dL — ABNORMAL HIGH (ref 7–25)
CO2: 27 mmol/L (ref 20–31)
Calcium: 8.8 mg/dL (ref 8.6–10.3)
Chloride: 101 mmol/L (ref 98–110)
Creat: 3.43 mg/dL — ABNORMAL HIGH (ref 0.70–1.18)
GFR, Est African American: 19 mL/min — ABNORMAL LOW (ref >=60)
GFR, Est Non African American: 16 mL/min — ABNORMAL LOW (ref >=60)
Glucose, Bld: 111 mg/dL — ABNORMAL HIGH (ref 65–99)
Potassium: 4.1 mmol/L (ref 3.5–5.3)
Sodium: 144 mmol/L (ref 135–146)
Total Bilirubin: 0.9 mg/dL (ref 0.2–1.2)
Total Protein: 6.6 g/dL (ref 6.1–8.1)

## 2016-07-20 LAB — TSH: TSH: 4 mIU/L (ref 0.40–4.50)

## 2016-07-20 MED ORDER — APIXABAN 5 MG PO TABS: 5.0000 mg | ORAL_TABLET | Freq: Two times a day (BID) | ORAL | 0 refills | Status: DC

## 2016-07-20 NOTE — Progress Notes (Signed)
Location:  [facility] clinic Provider:  Jeriann Sayres L. Mariea Clonts, D.O., C.M.D.  Code Status: full code Goals of Care:  Advanced Directives 07/20/2016  Does Patient Have a Medical Advance Directive? No  Type of Advance Directive -  Copy of [place] in Chart? -  Would patient like information on creating a medical advance directive? No - Patient declined     Chief Complaint  Patient presents with  . Follow-up    need med refills    HPI: Patient is a 76 y.o. male seen today for medical management of chronic diseases.    Weight is down 11 lbs.   Has been following with nephrology and he's on 2 diuretics in am and one in pm.    Going to wound care center and it's wrapped weekly on Wednesdays.  Has his pulmonary appt coming up.  Breathing pretty well.  No wheezing he notes.  Sometimes using nebulizer twice a day when he feels sob. Not using his Breo.  Uses occasionally.  Reviewed daily use of that inhaler.   Feeling pretty good.    Also has cardiology appt coming.  Still lightheaded in am some, but goes away.  Happens when he stands up from sitting.  He did have a low bp one morning at a doctor.  Not today--satisfactory.  Not dizzy this am.      Discussed use of pillbox--gets medications confused.  Will take am pills, then puts out rest for the day.  Rarely will miss a pill or two.    DMII:  On tradjenta.  Doing much better on diet with nephrologist's urging.    Afib:  On eliquis 5mg  po bid.  No palpitations or racing heart.    Past Medical History:  Diagnosis Date  . Allergy   . Asthma   . Atrophic gastritis   . Benign essential hypertension   . CHF (congestive heart failure) ([place])   . COPD (chronic obstructive pulmonary disease) ([place])   . Diabetes ([place])   . GERD (gastroesophageal reflux disease)   . Gout attack 09/2012  . Sinusitis, maxillary, chronic   . Type II or unspecified type diabetes mellitus without mention of complication, not stated as uncontrolled      Past Surgical History:  Procedure Laterality Date  . COLONOSCOPY  2003  . HERNIA REPAIR  1980    No Known Allergies  Allergies as of 07/20/2016   No Known Allergies     Medication List       Accurate as of 07/20/16  9:12 AM. Always use your most recent med list.          acetaminophen 325 MG tablet Commonly known as:  TYLENOL Take 650 mg by mouth every 6 (six) hours as needed for mild pain.   albuterol (2.5 MG/3ML) 0.083% nebulizer solution Commonly known as:  PROVENTIL INHALE CONTENTS OF 1 VIAL IN NEBULIZER EVERY 4 HOURS AS NEEDED FOR WHEEZING AND ASTHMA   allopurinol 300 MG tablet Commonly known as:  ZYLOPRIM TAKE 1 TABLET BY MOUTH EVERY DAY   amiodarone 200 MG tablet Commonly known as:  PACERONE TAKE 1 TABLET TWICE A DAY   apixaban 5 MG Tabs tablet Commonly known as:  ELIQUIS Take 1 tablet (5 mg total) by mouth 2 (two) times daily.   CENTRUM SILVER ADULT 50+ PO Take 1 tablet by mouth daily.   fluticasone 50 MCG/ACT nasal spray Commonly known as:  FLONASE Place 1 spray into both nostrils daily as needed for  allergies.   fluticasone furoate-vilanterol 100-25 MCG/INH Aepb Commonly known as:  BREO ELLIPTA Inhale 1 puff into the lungs daily.   KLOR-CON M20 20 MEQ tablet Generic drug:  potassium chloride SA TAKE 1 TABLET BY MOUTH EVERY DAY   levothyroxine 175 MCG tablet Commonly known as:  SYNTHROID, LEVOTHROID TAKE 1 TABLET EVERY DAY BEFORE BREAKFAST   linagliptin 5 MG Tabs tablet Commonly known as:  TRADJENTA Take 1 tablet (5 mg total) by mouth daily.   montelukast 10 MG tablet Commonly known as:  SINGULAIR Take one tablet by mouth at bedtime as needed for allergies and asthma   pantoprazole 40 MG tablet Commonly known as:  PROTONIX TAKE 1 TABLET BY MOUTH EVERY DAY BEFORE SUPPER   pravastatin 20 MG tablet Commonly known as:  PRAVACHOL TAKE 1 TABLET BY MOUTH EVERY DAY WITH SUPPER   silver sulfADIAZINE 1 % cream Commonly known as:   SILVADENE   torsemide 20 MG tablet Commonly known as:  DEMADEX TAKE 1 TABLET TWICE A DAY       Review of Systems:  Review of Systems  Constitutional: Positive for weight loss. Negative for chills, fever and malaise/fatigue.  HENT: Negative for congestion and hearing loss.   Eyes: Negative for blurred vision.  Respiratory: Positive for shortness of breath. Negative for cough and wheezing.   Cardiovascular: Negative for chest pain, palpitations and leg swelling.  Gastrointestinal: Negative for abdominal pain, blood in stool, constipation, diarrhea and melena.  Genitourinary: Negative for dysuria.  Musculoskeletal: Negative for falls.  Neurological: Negative for dizziness, loss of consciousness and weakness.  Endo/Heme/Allergies: Does not bruise/bleed easily.  Psychiatric/Behavioral: Positive for memory loss. Negative for depression.    Health Maintenance  Topic Date Due  . URINE MICROALBUMIN  05/29/2016  . INFLUENZA VACCINE  09/05/2016  . HEMOGLOBIN A1C  09/25/2016  . OPHTHALMOLOGY EXAM  12/12/2016  . FOOT EXAM  03/02/2017  . TETANUS/TDAP  02/05/2021  . PNA vac Low Risk Adult  Completed    Physical Exam: Vitals:   07/20/16 0858  BP: 130/80  Pulse: 88  Temp: 97.5 F (36.4 C)  TempSrc: Oral  SpO2: 95%  Weight: 225 lb (102.1 kg)   Body mass index is 37.44 kg/m. Physical Exam  Constitutional: He is oriented to person, place, and time. He appears well-developed and well-nourished. No distress.  Cardiovascular:  Murmur heard. irreg irreg  Pulmonary/Chest: Effort normal and breath sounds normal. No respiratory distress.  Abdominal: Soft. Bowel sounds are normal. He exhibits no distension. There is no tenderness.  Musculoskeletal: Normal range of motion.  Neurological: He is alert and oriented to person, place, and time.  Skin: Skin is warm and dry.  Has unnaboot on right leg   Psychiatric: He has a normal mood and affect.    Labs reviewed: Basic Metabolic  Panel:  Recent Labs  01/17/16 1146 03/01/16 0819 03/28/16 1015  04/23/16 0939 05/17/16 1345 05/28/16 1700  NA 142 142 141  < > 142 143 140  K 4.0 3.9 4.4  < > 4.6 4.0 4.0  CL 103 98 97*  < > 100 99 101  CO2 31 33* 35*  < > 35* 29 29  GLUCOSE 85 87 78  < > 102* 90 119*  BUN 18 32* 30*  < > 32* 44* 58*  CREATININE 1.76* 1.97* 2.19*  < > 1.93* 2.53* 2.97*  CALCIUM 9.2 9.3 8.6  < > 9.1 9.1 8.8*  TSH 4.92* 5.82* 5.71*  --   --   --   --   < > =  values in this interval not displayed. Liver Function Tests:  Recent Labs  10/28/15 0938 03/01/16 0819 03/28/16 1015  AST 30 40* 34  ALT 22 25 24   ALKPHOS 64 113 123*  BILITOT 0.8 0.8 0.9  PROT 7.8 7.5 7.4  ALBUMIN 4.2 4.2 4.1   No results for input(s): LIPASE, AMYLASE in the last 8760 hours. No results for input(s): AMMONIA in the last 8760 hours. CBC:  Recent Labs  11/03/15 1006 03/01/16 0819  04/10/16 0456 04/11/16 0516 05/28/16 1700  WBC 5.9 3.6*  < > 4.5 4.3 4.5  NEUTROABS 4,130 2,160  --   --   --  3.2  HGB 12.1* 13.0*  < > 11.8* 11.7* 10.7*  HCT 37.2* 40.9  < > 38.6* 37.9* 34.8*  MCV 94.7 95.1  < > 98.5 97.2 99.1  PLT 240 212  < > 164 164 160  < > = values in this interval not displayed. Lipid Panel:  Recent Labs  08/24/15 0825 03/01/16 0819 03/28/16 1015  CHOL 163 161 162  HDL 83 69 84  LDLCALC 69 80 70  TRIG 55 62 41  CHOLHDL 2.0 2.3 1.9   Lab Results  Component Value Date   HGBA1C 5.9 (H) 03/28/2016     Assessment/Plan 1. Venous ulcer of ankle, right ([place]) -cont wound care center visits weekly with unnaboot on right foot--gradually improving -actually following his low sodium diet and exercising should be helping, too--glad he decided to listen to his specialists   2. Chronic kidney disease (CKD) stage G3b/A2, moderately decreased glomerular filtration rate (GFR) between 30-44 mL/min/1.73 square meter and albuminuria creatinine ratio between 30-299 mg/g - Avoid nephrotoxic agents like nsaids,  dose adjust renally excreted meds, hydrate. - COMPLETE METABOLIC PANEL WITH GFR  3. Controlled type 2 diabetes mellitus with stage 3 chronic kidney disease, without long-term current use of insulin (HCC) - well controlled, cont just tradjenta which also helps with weight - Hemoglobin A1c  4. Hypothyroidism, unspecified type - cont current levothyroxine, f/u lab - TSH  5. COPD GOLD II -counseled on importance of actually using the breo so he does not need his nebs, is not using albuterol -we had no breo samples, but he does say he has one at home -has pulmonary appt upcoming  6. OSA on CPAP -ongoing, cont cpap  7. Persistent atrial fibrillation (HCC) -cont eliquis therapy, rate is controlled, asymptomatic  8. Chronic diastolic CHF (congestive heart failure) (HCC) -stable now with weight trending down, cont regimen per cardiology and nephrology  Labs/tests ordered:   Orders Placed This Encounter  Procedures  . Hemoglobin A1c  . COMPLETE METABOLIC PANEL WITH GFR  . TSH    Next appt:  11/26/2016 med mgt  Hristopher Missildine L. Vianey Caniglia, D.O. [facility] [address], [place] [address] Cell Phone (Mon-Fri 8am-5pm):  [phone] On Call:  [phone] & follow prompts after 5pm & weekends Office Phone:  [phone] Office Fax:  [phone]

## 2016-07-21 LAB — HEMOGLOBIN A1C
Hgb A1c MFr Bld: 5.8 % — ABNORMAL HIGH (ref <5.7)
Mean Plasma Glucose: 120 mg/dL

## 2016-07-22 NOTE — Progress Notes (Deleted)
Cardiology Office Note    Date:  07/22/2016   ID:  Omar, Lambert 1940-11-27, MRN [id number]  PCP:  Gayland Curry, DO  Cardiologist: Dr. Claiborne Billings   No chief complaint on file.   History of Present Illness:    Omar Lambert is a 76 y.o. male with past medical history of chronic diastolic CHF, PAF, HTN, Type 2 DM, Stage 3 CKD, COPD, and OSA who presents to the office today for 48-month follow-up.   He was last examined by Dr. Claiborne Billings in 04/2016 for follow-up of a recent hospitalization for an acute CHF exacerbation and possible CAP. He diuresed with IV Lasix and was placed on Torsemide 20mg  BID at the time of discharge. He was maintaining NSR and was continued on Eliquis for anticoagulation along with Amiodarone 200mg  BID.   He was evaluated by Dr. Jimmy Footman on 07/10/2016 for worsening creatinine as he is now at Stage 4 CKD (creatinine elevated to 3.59). His Torsmide was increased to 40mg  in AM and 20mg  at noon due to his worsening edema and dyspnea. Serum Amiodarone was ar 1.7.   Past Medical History:  Diagnosis Date  . Allergy   . Asthma   . Atrophic gastritis   . Benign essential hypertension   . CHF (congestive heart failure) ([place])   . COPD (chronic obstructive pulmonary disease) ([place])   . Diabetes ([place])   . GERD (gastroesophageal reflux disease)   . Gout attack 09/2012  . Sinusitis, maxillary, chronic   . Type II or unspecified type diabetes mellitus without mention of complication, not stated as uncontrolled     Past Surgical History:  Procedure Laterality Date  . COLONOSCOPY  2003  . HERNIA REPAIR  1980    Current Medications: Outpatient Medications Prior to Visit  Medication Sig Dispense Refill  . acetaminophen (TYLENOL) 325 MG tablet Take 650 mg by mouth every 6 (six) hours as needed for mild pain.    Marland Kitchen albuterol (PROVENTIL) (2.5 MG/3ML) 0.083% nebulizer solution INHALE CONTENTS OF 1 VIAL IN NEBULIZER EVERY 4 HOURS AS NEEDED FOR WHEEZING AND ASTHMA 75 mL 5  .  allopurinol (ZYLOPRIM) 300 MG tablet TAKE 1 TABLET BY MOUTH EVERY DAY 30 tablet 6  . amiodarone (PACERONE) 200 MG tablet TAKE 1 TABLET TWICE A DAY 60 tablet 11  . apixaban (ELIQUIS) 5 MG TABS tablet Take 1 tablet (5 mg total) by mouth 2 (two) times daily. 60 tablet 0  . fluticasone (FLONASE) 50 MCG/ACT nasal spray Place 1 spray into both nostrils daily as needed for allergies.     . fluticasone furoate-vilanterol (BREO ELLIPTA) 100-25 MCG/INH AEPB Inhale 1 puff into the lungs daily.    Marland Kitchen KLOR-CON M20 20 MEQ tablet TAKE 1 TABLET BY MOUTH EVERY DAY 30 tablet 1  . levothyroxine (SYNTHROID, LEVOTHROID) 175 MCG tablet TAKE 1 TABLET EVERY DAY BEFORE BREAKFAST 30 tablet 3  . linagliptin (TRADJENTA) 5 MG TABS tablet Take 1 tablet (5 mg total) by mouth daily. 90 tablet 3  . montelukast (SINGULAIR) 10 MG tablet Take one tablet by mouth at bedtime as needed for allergies and asthma 90 tablet 0  . Multiple Vitamins-Minerals (CENTRUM SILVER ADULT 50+ PO) Take 1 tablet by mouth daily.    . pantoprazole (PROTONIX) 40 MG tablet TAKE 1 TABLET BY MOUTH EVERY DAY BEFORE SUPPER 90 tablet 1  . pravastatin (PRAVACHOL) 20 MG tablet TAKE 1 TABLET BY MOUTH EVERY DAY WITH SUPPER 30 tablet 3  . silver sulfADIAZINE (SILVADENE) 1 %  cream     . torsemide (DEMADEX) 20 MG tablet TAKE 1 TABLET TWICE A DAY 60 tablet 3   No facility-administered medications prior to visit.      Allergies:   Patient has no known allergies.   Social History   Social History  . Marital status: Married    Spouse name: N/A  . Number of children: 3  . Years of education: N/A   Occupational History  . Retired    Social History Main Topics  . Smoking status: Former Smoker    Packs/day: 1.00    Years: 20.00    Types: Cigarettes    Quit date: 06/17/1997  . Smokeless tobacco: Never Used  . Alcohol use No  . Drug use: No  . Sexual activity: Not Currently   Other Topics Concern  . Not on file   Social History Narrative   Lives in 1  story apt with his wife, Omar Lambert.  Sometimes exercises.  Drinks coffee.       Family History:  The patient's ***family history includes COPD in his sister and sister; Cancer in his sister; Diabetes in his sister; Stroke in his mother.   Review of Systems:   Please see the history of present illness.     General:  No chills, fever, night sweats or weight changes.  Cardiovascular:  No chest pain, dyspnea on exertion, edema, orthopnea, palpitations, paroxysmal nocturnal dyspnea. Dermatological: No rash, lesions/masses Respiratory: No cough, dyspnea Urologic: No hematuria, dysuria Abdominal:   No nausea, vomiting, diarrhea, bright red blood per rectum, melena, or hematemesis Neurologic:  No visual changes, wkns, changes in mental status. All other systems reviewed and are otherwise negative except as noted above.   Physical Exam:    VS:  There were no vitals taken for this visit.   General: Well developed, well nourished,male appearing in no acute distress. Head: Normocephalic, atraumatic, sclera non-icteric, no xanthomas, nares are without discharge.  Neck: No carotid bruits. JVD not elevated.  Lungs: Respirations regular and unlabored, without wheezes or rales.  Heart: ***Regular rate and rhythm. No S3 or S4.  No murmur, no rubs, or gallops appreciated. Abdomen: Soft, non-tender, non-distended with normoactive bowel sounds. No hepatomegaly. No rebound/guarding. No obvious abdominal masses. Msk:  Strength and tone appear normal for age. No joint deformities or effusions. Extremities: No clubbing or cyanosis. No edema.  Distal pedal pulses are 2+ bilaterally. Neuro: Alert and oriented X 3. Moves all extremities spontaneously. No focal deficits noted. Psych:  Responds to questions appropriately with a normal affect. Skin: No rashes or lesions noted  Wt Readings from Last 3 Encounters:  07/20/16 225 lb (102.1 kg)  05/17/16 236 lb (107 kg)  05/10/16 243 lb 12.8 oz (110.6 kg)         Studies/Labs Reviewed:   EKG:  EKG is*** ordered today.  The ekg ordered today demonstrates ***  Recent Labs: 04/09/2016: B Natriuretic Peptide 460.5 05/28/2016: Hemoglobin 10.7; Platelets 160 07/20/2016: ALT 25; BUN 59; Creat 3.43; Potassium 4.1; Sodium 144; TSH 4.00   Lipid Panel    Component Value Date/Time   CHOL 162 03/28/2016 1015   CHOL 151 05/25/2015 0950   TRIG 41 03/28/2016 1015   HDL 84 03/28/2016 1015   HDL 68 05/25/2015 0950   CHOLHDL 1.9 03/28/2016 1015   VLDL 8 03/28/2016 1015   LDLCALC 70 03/28/2016 1015   LDLCALC 68 05/25/2015 0950    Additional studies/ records that were reviewed today include:   Echocardiogram: 08/2015  Study Conclusions  - Left ventricle: The cavity size was normal. There was moderate   concentric hypertrophy. Systolic function was normal. The   estimated ejection fraction was in the range of 60% to 65%. Wall   motion was normal; there were no regional wall motion   abnormalities. - Left atrium: The atrium was moderately dilated. - Right ventricle: The cavity size was mildly dilated. Wall   thickness was increased. - Right atrium: The atrium was moderately dilated. - Tricuspid valve: There was moderate regurgitation. - Pulmonary arteries: Systolic pressure was mildly increased. PA   peak pressure: 40 mm Hg (S).  Assessment:    No diagnosis found.   Plan:   In order of problems listed above:  1. ***    Medication Adjustments/Labs and Tests Ordered: Current medicines are reviewed at length with the patient today.  Concerns regarding medicines are outlined above.  Medication changes, Labs and Tests ordered today are listed in the Patient Instructions below. There are no Patient Instructions on file for this visit.   Signed, Erma Heritage, PA-C  07/22/2016 6:07 PM    [facility] [place], [place], [place]  [address] Phone: [phone]; Fax: [phone]  [address], [place], [place] [address] Phone: [phone]

## 2016-07-23 ENCOUNTER — Encounter: Payer: Self-pay | Admitting: *Deleted

## 2016-07-23 ENCOUNTER — Ambulatory Visit: Payer: Medicare HMO | Admitting: Student

## 2016-07-24 ENCOUNTER — Other Ambulatory Visit: Payer: Self-pay | Admitting: Internal Medicine

## 2016-07-25 ENCOUNTER — Other Ambulatory Visit: Payer: Self-pay | Admitting: Surgery

## 2016-07-25 DIAGNOSIS — I509 Heart failure, unspecified: Secondary | ICD-10-CM | POA: Diagnosis not present

## 2016-07-25 DIAGNOSIS — L97919 Non-pressure chronic ulcer of unspecified part of right lower leg with unspecified severity: Secondary | ICD-10-CM

## 2016-07-25 DIAGNOSIS — I110 Hypertensive heart disease with heart failure: Secondary | ICD-10-CM | POA: Diagnosis not present

## 2016-07-25 DIAGNOSIS — G4730 Sleep apnea, unspecified: Secondary | ICD-10-CM | POA: Diagnosis not present

## 2016-07-25 DIAGNOSIS — E1151 Type 2 diabetes mellitus with diabetic peripheral angiopathy without gangrene: Secondary | ICD-10-CM | POA: Diagnosis not present

## 2016-07-25 DIAGNOSIS — E11622 Type 2 diabetes mellitus with other skin ulcer: Secondary | ICD-10-CM | POA: Diagnosis not present

## 2016-07-25 DIAGNOSIS — I87311 Chronic venous hypertension (idiopathic) with ulcer of right lower extremity: Secondary | ICD-10-CM | POA: Diagnosis not present

## 2016-07-25 DIAGNOSIS — L97312 Non-pressure chronic ulcer of right ankle with fat layer exposed: Secondary | ICD-10-CM | POA: Diagnosis not present

## 2016-07-25 DIAGNOSIS — J449 Chronic obstructive pulmonary disease, unspecified: Secondary | ICD-10-CM | POA: Diagnosis not present

## 2016-07-27 ENCOUNTER — Ambulatory Visit (HOSPITAL_COMMUNITY)
Admission: RE | Admit: 2016-07-27 | Discharge: 2016-07-27 | Disposition: A | Discharge status: Home / Self Care | Payer: Medicare HMO | Source: Ambulatory Visit | Attending: Vascular Surgery | Admitting: Vascular Surgery

## 2016-07-27 DIAGNOSIS — L97919 Non-pressure chronic ulcer of unspecified part of right lower leg with unspecified severity: Secondary | ICD-10-CM

## 2016-07-27 DIAGNOSIS — I87311 Chronic venous hypertension (idiopathic) with ulcer of right lower extremity: Secondary | ICD-10-CM | POA: Diagnosis not present

## 2016-08-01 DIAGNOSIS — E1151 Type 2 diabetes mellitus with diabetic peripheral angiopathy without gangrene: Secondary | ICD-10-CM | POA: Diagnosis not present

## 2016-08-01 DIAGNOSIS — I110 Hypertensive heart disease with heart failure: Secondary | ICD-10-CM | POA: Diagnosis not present

## 2016-08-01 DIAGNOSIS — I87311 Chronic venous hypertension (idiopathic) with ulcer of right lower extremity: Secondary | ICD-10-CM | POA: Diagnosis not present

## 2016-08-01 DIAGNOSIS — L97312 Non-pressure chronic ulcer of right ankle with fat layer exposed: Secondary | ICD-10-CM | POA: Diagnosis not present

## 2016-08-01 DIAGNOSIS — J449 Chronic obstructive pulmonary disease, unspecified: Secondary | ICD-10-CM | POA: Diagnosis not present

## 2016-08-01 DIAGNOSIS — E11622 Type 2 diabetes mellitus with other skin ulcer: Secondary | ICD-10-CM | POA: Diagnosis not present

## 2016-08-01 DIAGNOSIS — I509 Heart failure, unspecified: Secondary | ICD-10-CM | POA: Diagnosis not present

## 2016-08-01 DIAGNOSIS — G4730 Sleep apnea, unspecified: Secondary | ICD-10-CM | POA: Diagnosis not present

## 2016-08-15 ENCOUNTER — Encounter (HOSPITAL_BASED_OUTPATIENT_CLINIC_OR_DEPARTMENT_OTHER): Payer: Medicare HMO | Attending: Surgery

## 2016-08-15 DIAGNOSIS — E11622 Type 2 diabetes mellitus with other skin ulcer: Secondary | ICD-10-CM | POA: Insufficient documentation

## 2016-08-15 DIAGNOSIS — J449 Chronic obstructive pulmonary disease, unspecified: Secondary | ICD-10-CM | POA: Insufficient documentation

## 2016-08-15 DIAGNOSIS — I509 Heart failure, unspecified: Secondary | ICD-10-CM | POA: Insufficient documentation

## 2016-08-15 DIAGNOSIS — I87311 Chronic venous hypertension (idiopathic) with ulcer of right lower extremity: Secondary | ICD-10-CM | POA: Insufficient documentation

## 2016-08-15 DIAGNOSIS — I110 Hypertensive heart disease with heart failure: Secondary | ICD-10-CM | POA: Insufficient documentation

## 2016-08-15 DIAGNOSIS — E1151 Type 2 diabetes mellitus with diabetic peripheral angiopathy without gangrene: Secondary | ICD-10-CM | POA: Insufficient documentation

## 2016-08-15 DIAGNOSIS — G4730 Sleep apnea, unspecified: Secondary | ICD-10-CM | POA: Insufficient documentation

## 2016-08-15 DIAGNOSIS — L97312 Non-pressure chronic ulcer of right ankle with fat layer exposed: Secondary | ICD-10-CM | POA: Insufficient documentation

## 2016-08-17 ENCOUNTER — Encounter: Payer: Self-pay | Admitting: Internal Medicine

## 2016-08-22 DIAGNOSIS — G4730 Sleep apnea, unspecified: Secondary | ICD-10-CM | POA: Diagnosis not present

## 2016-08-22 DIAGNOSIS — E11622 Type 2 diabetes mellitus with other skin ulcer: Secondary | ICD-10-CM | POA: Diagnosis not present

## 2016-08-22 DIAGNOSIS — I87311 Chronic venous hypertension (idiopathic) with ulcer of right lower extremity: Secondary | ICD-10-CM | POA: Diagnosis not present

## 2016-08-22 DIAGNOSIS — I110 Hypertensive heart disease with heart failure: Secondary | ICD-10-CM | POA: Diagnosis not present

## 2016-08-22 DIAGNOSIS — L97919 Non-pressure chronic ulcer of unspecified part of right lower leg with unspecified severity: Secondary | ICD-10-CM | POA: Diagnosis not present

## 2016-08-22 DIAGNOSIS — L97312 Non-pressure chronic ulcer of right ankle with fat layer exposed: Secondary | ICD-10-CM | POA: Diagnosis not present

## 2016-08-22 DIAGNOSIS — I509 Heart failure, unspecified: Secondary | ICD-10-CM | POA: Diagnosis not present

## 2016-08-22 DIAGNOSIS — E1151 Type 2 diabetes mellitus with diabetic peripheral angiopathy without gangrene: Secondary | ICD-10-CM | POA: Diagnosis not present

## 2016-08-22 DIAGNOSIS — J449 Chronic obstructive pulmonary disease, unspecified: Secondary | ICD-10-CM | POA: Diagnosis not present

## 2016-08-23 DIAGNOSIS — I129 Hypertensive chronic kidney disease with stage 1 through stage 4 chronic kidney disease, or unspecified chronic kidney disease: Secondary | ICD-10-CM | POA: Diagnosis not present

## 2016-08-23 DIAGNOSIS — E785 Hyperlipidemia, unspecified: Secondary | ICD-10-CM | POA: Diagnosis not present

## 2016-08-23 DIAGNOSIS — I509 Heart failure, unspecified: Secondary | ICD-10-CM | POA: Diagnosis not present

## 2016-08-23 DIAGNOSIS — E1129 Type 2 diabetes mellitus with other diabetic kidney complication: Secondary | ICD-10-CM | POA: Diagnosis not present

## 2016-08-23 DIAGNOSIS — N184 Chronic kidney disease, stage 4 (severe): Secondary | ICD-10-CM | POA: Diagnosis not present

## 2016-08-23 DIAGNOSIS — N2581 Secondary hyperparathyroidism of renal origin: Secondary | ICD-10-CM | POA: Diagnosis not present

## 2016-08-23 DIAGNOSIS — D631 Anemia in chronic kidney disease: Secondary | ICD-10-CM | POA: Diagnosis not present

## 2016-08-23 DIAGNOSIS — M109 Gout, unspecified: Secondary | ICD-10-CM | POA: Diagnosis not present

## 2016-08-23 DIAGNOSIS — I4891 Unspecified atrial fibrillation: Secondary | ICD-10-CM | POA: Diagnosis not present

## 2016-08-29 DIAGNOSIS — L97312 Non-pressure chronic ulcer of right ankle with fat layer exposed: Secondary | ICD-10-CM | POA: Diagnosis not present

## 2016-08-29 DIAGNOSIS — J449 Chronic obstructive pulmonary disease, unspecified: Secondary | ICD-10-CM | POA: Diagnosis not present

## 2016-08-29 DIAGNOSIS — I509 Heart failure, unspecified: Secondary | ICD-10-CM | POA: Diagnosis not present

## 2016-08-29 DIAGNOSIS — E1151 Type 2 diabetes mellitus with diabetic peripheral angiopathy without gangrene: Secondary | ICD-10-CM | POA: Diagnosis not present

## 2016-08-29 DIAGNOSIS — E11622 Type 2 diabetes mellitus with other skin ulcer: Secondary | ICD-10-CM | POA: Diagnosis not present

## 2016-08-29 DIAGNOSIS — G4730 Sleep apnea, unspecified: Secondary | ICD-10-CM | POA: Diagnosis not present

## 2016-08-29 DIAGNOSIS — I87311 Chronic venous hypertension (idiopathic) with ulcer of right lower extremity: Secondary | ICD-10-CM | POA: Diagnosis not present

## 2016-08-29 DIAGNOSIS — I110 Hypertensive heart disease with heart failure: Secondary | ICD-10-CM | POA: Diagnosis not present

## 2016-08-30 ENCOUNTER — Institutional Professional Consult (permissible substitution): Payer: Medicare HMO | Admitting: Internal Medicine

## 2016-09-05 ENCOUNTER — Encounter (HOSPITAL_BASED_OUTPATIENT_CLINIC_OR_DEPARTMENT_OTHER): Payer: Medicare HMO | Attending: Surgery

## 2016-09-05 DIAGNOSIS — L97511 Non-pressure chronic ulcer of other part of right foot limited to breakdown of skin: Secondary | ICD-10-CM | POA: Insufficient documentation

## 2016-09-05 DIAGNOSIS — I110 Hypertensive heart disease with heart failure: Secondary | ICD-10-CM | POA: Diagnosis not present

## 2016-09-05 DIAGNOSIS — L97312 Non-pressure chronic ulcer of right ankle with fat layer exposed: Secondary | ICD-10-CM | POA: Insufficient documentation

## 2016-09-05 DIAGNOSIS — J449 Chronic obstructive pulmonary disease, unspecified: Secondary | ICD-10-CM | POA: Insufficient documentation

## 2016-09-05 DIAGNOSIS — I87311 Chronic venous hypertension (idiopathic) with ulcer of right lower extremity: Secondary | ICD-10-CM | POA: Insufficient documentation

## 2016-09-05 DIAGNOSIS — I509 Heart failure, unspecified: Secondary | ICD-10-CM | POA: Diagnosis not present

## 2016-09-05 DIAGNOSIS — G4730 Sleep apnea, unspecified: Secondary | ICD-10-CM | POA: Diagnosis not present

## 2016-09-05 DIAGNOSIS — I890 Lymphedema, not elsewhere classified: Secondary | ICD-10-CM | POA: Diagnosis not present

## 2016-09-05 DIAGNOSIS — E11622 Type 2 diabetes mellitus with other skin ulcer: Secondary | ICD-10-CM | POA: Diagnosis not present

## 2016-09-05 DIAGNOSIS — E1151 Type 2 diabetes mellitus with diabetic peripheral angiopathy without gangrene: Secondary | ICD-10-CM | POA: Diagnosis not present

## 2016-09-12 DIAGNOSIS — E11622 Type 2 diabetes mellitus with other skin ulcer: Secondary | ICD-10-CM | POA: Diagnosis not present

## 2016-09-12 DIAGNOSIS — L97312 Non-pressure chronic ulcer of right ankle with fat layer exposed: Secondary | ICD-10-CM | POA: Diagnosis not present

## 2016-09-12 DIAGNOSIS — I87311 Chronic venous hypertension (idiopathic) with ulcer of right lower extremity: Secondary | ICD-10-CM | POA: Diagnosis not present

## 2016-09-12 DIAGNOSIS — E1151 Type 2 diabetes mellitus with diabetic peripheral angiopathy without gangrene: Secondary | ICD-10-CM | POA: Diagnosis not present

## 2016-09-12 DIAGNOSIS — L97511 Non-pressure chronic ulcer of other part of right foot limited to breakdown of skin: Secondary | ICD-10-CM | POA: Diagnosis not present

## 2016-09-12 DIAGNOSIS — J449 Chronic obstructive pulmonary disease, unspecified: Secondary | ICD-10-CM | POA: Diagnosis not present

## 2016-09-12 DIAGNOSIS — G4730 Sleep apnea, unspecified: Secondary | ICD-10-CM | POA: Diagnosis not present

## 2016-09-12 DIAGNOSIS — I110 Hypertensive heart disease with heart failure: Secondary | ICD-10-CM | POA: Diagnosis not present

## 2016-09-12 DIAGNOSIS — I509 Heart failure, unspecified: Secondary | ICD-10-CM | POA: Diagnosis not present

## 2016-09-12 DIAGNOSIS — L97311 Non-pressure chronic ulcer of right ankle limited to breakdown of skin: Secondary | ICD-10-CM | POA: Diagnosis not present

## 2016-09-26 DIAGNOSIS — L97511 Non-pressure chronic ulcer of other part of right foot limited to breakdown of skin: Secondary | ICD-10-CM | POA: Diagnosis not present

## 2016-09-26 DIAGNOSIS — I110 Hypertensive heart disease with heart failure: Secondary | ICD-10-CM | POA: Diagnosis not present

## 2016-09-26 DIAGNOSIS — G4730 Sleep apnea, unspecified: Secondary | ICD-10-CM | POA: Diagnosis not present

## 2016-09-26 DIAGNOSIS — I509 Heart failure, unspecified: Secondary | ICD-10-CM | POA: Diagnosis not present

## 2016-09-26 DIAGNOSIS — J449 Chronic obstructive pulmonary disease, unspecified: Secondary | ICD-10-CM | POA: Diagnosis not present

## 2016-09-26 DIAGNOSIS — E11622 Type 2 diabetes mellitus with other skin ulcer: Secondary | ICD-10-CM | POA: Diagnosis not present

## 2016-09-26 DIAGNOSIS — L97312 Non-pressure chronic ulcer of right ankle with fat layer exposed: Secondary | ICD-10-CM | POA: Diagnosis not present

## 2016-09-26 DIAGNOSIS — E1151 Type 2 diabetes mellitus with diabetic peripheral angiopathy without gangrene: Secondary | ICD-10-CM | POA: Diagnosis not present

## 2016-09-26 DIAGNOSIS — I87311 Chronic venous hypertension (idiopathic) with ulcer of right lower extremity: Secondary | ICD-10-CM | POA: Diagnosis not present

## 2016-09-27 ENCOUNTER — Other Ambulatory Visit: Payer: Self-pay | Admitting: Internal Medicine

## 2016-09-27 DIAGNOSIS — L97919 Non-pressure chronic ulcer of unspecified part of right lower leg with unspecified severity: Secondary | ICD-10-CM | POA: Diagnosis not present

## 2016-09-30 DIAGNOSIS — G4733 Obstructive sleep apnea (adult) (pediatric): Secondary | ICD-10-CM | POA: Diagnosis not present

## 2016-10-02 ENCOUNTER — Telehealth: Payer: Self-pay | Admitting: Cardiovascular Disease

## 2016-10-02 NOTE — Telephone Encounter (Signed)
New message ° ° ° ° ° °Patient calling the office for samples of medication: ° ° °1.  What medication and dosage are you requesting samples for? Eliquis 5 mg  ° °2.  Are you currently out of this medication? Yes  ° ° ° °

## 2016-10-02 NOTE — Telephone Encounter (Signed)
Returned call, goes to VM. Left msg advising no samples available, if refill authorization needed please call back.

## 2016-10-03 DIAGNOSIS — J449 Chronic obstructive pulmonary disease, unspecified: Secondary | ICD-10-CM | POA: Diagnosis not present

## 2016-10-03 DIAGNOSIS — L97312 Non-pressure chronic ulcer of right ankle with fat layer exposed: Secondary | ICD-10-CM | POA: Diagnosis not present

## 2016-10-03 DIAGNOSIS — E11622 Type 2 diabetes mellitus with other skin ulcer: Secondary | ICD-10-CM | POA: Diagnosis not present

## 2016-10-03 DIAGNOSIS — L97511 Non-pressure chronic ulcer of other part of right foot limited to breakdown of skin: Secondary | ICD-10-CM | POA: Diagnosis not present

## 2016-10-03 DIAGNOSIS — I509 Heart failure, unspecified: Secondary | ICD-10-CM | POA: Diagnosis not present

## 2016-10-03 DIAGNOSIS — I87311 Chronic venous hypertension (idiopathic) with ulcer of right lower extremity: Secondary | ICD-10-CM | POA: Diagnosis not present

## 2016-10-03 DIAGNOSIS — E1151 Type 2 diabetes mellitus with diabetic peripheral angiopathy without gangrene: Secondary | ICD-10-CM | POA: Diagnosis not present

## 2016-10-03 DIAGNOSIS — G4730 Sleep apnea, unspecified: Secondary | ICD-10-CM | POA: Diagnosis not present

## 2016-10-03 DIAGNOSIS — I110 Hypertensive heart disease with heart failure: Secondary | ICD-10-CM | POA: Diagnosis not present

## 2016-10-03 MED ORDER — APIXABAN 5 MG PO TABS: 5.0000 mg | ORAL_TABLET | Freq: Two times a day (BID) | ORAL | 1 refills | Status: DC

## 2016-10-03 NOTE — Telephone Encounter (Signed)
No samples available here

## 2016-10-03 NOTE — Telephone Encounter (Signed)
S/w pt he states that he has a few samples left and will check with pharmacy to see how much rx for eliquis is. But, pt states in the past this medication is too expensive for him to take. He will call back next week for samples.  Do you want to change pt to coumadin? It seems as he is non-compliant with this medication.   Omar Lambert pt is due for follow up in september

## 2016-10-04 DIAGNOSIS — Z961 Presence of intraocular lens: Secondary | ICD-10-CM | POA: Diagnosis not present

## 2016-10-04 DIAGNOSIS — H2512 Age-related nuclear cataract, left eye: Secondary | ICD-10-CM | POA: Diagnosis not present

## 2016-10-04 DIAGNOSIS — H00021 Hordeolum internum right upper eyelid: Secondary | ICD-10-CM | POA: Diagnosis not present

## 2016-10-04 DIAGNOSIS — H00022 Hordeolum internum right lower eyelid: Secondary | ICD-10-CM | POA: Diagnosis not present

## 2016-10-05 ENCOUNTER — Telehealth: Payer: Self-pay | Admitting: Cardiovascular Disease

## 2016-10-05 NOTE — Telephone Encounter (Signed)
New message     *STAT* If patient is at the pharmacy, call can be transferred to refill team.   1. Which medications need to be refilled? (please list name of each medication and dose if known) Eliquis 5 mg   2. Which pharmacy/location (including street and city if local pharmacy) is medication to be sent to? [facility]   3. Do they need a 30 day or 90 day supply? 30 day-pt said he does not want 3 month at a time cause he can not afford it. He wants one month at a time.

## 2016-10-05 NOTE — Telephone Encounter (Signed)
Left msg to inform patient this medication was sent in for 30 day supply - if he has any other needs please call.

## 2016-10-09 NOTE — Telephone Encounter (Addendum)
[facility]  she states that 1 month costs$242-unfortunately she cannot tell if this is because he is in the donut hole or if this is the monthly price. She states that pt may know.  LM2CB

## 2016-10-09 NOTE — Telephone Encounter (Signed)
In the past, the patient has not been very compliant with Xarelto ultimately was switched to eloquence.  We did discuss with the patient concerning transition to warfarin, but I'm not certain of compliance with required laboratory

## 2016-10-10 ENCOUNTER — Encounter (HOSPITAL_BASED_OUTPATIENT_CLINIC_OR_DEPARTMENT_OTHER): Payer: Medicare HMO | Attending: Surgery

## 2016-10-10 DIAGNOSIS — L97322 Non-pressure chronic ulcer of left ankle with fat layer exposed: Secondary | ICD-10-CM | POA: Insufficient documentation

## 2016-10-10 DIAGNOSIS — I10 Essential (primary) hypertension: Secondary | ICD-10-CM | POA: Insufficient documentation

## 2016-10-10 DIAGNOSIS — G4730 Sleep apnea, unspecified: Secondary | ICD-10-CM | POA: Insufficient documentation

## 2016-10-10 DIAGNOSIS — J449 Chronic obstructive pulmonary disease, unspecified: Secondary | ICD-10-CM | POA: Insufficient documentation

## 2016-10-10 DIAGNOSIS — I87311 Chronic venous hypertension (idiopathic) with ulcer of right lower extremity: Secondary | ICD-10-CM | POA: Insufficient documentation

## 2016-10-10 DIAGNOSIS — E119 Type 2 diabetes mellitus without complications: Secondary | ICD-10-CM | POA: Insufficient documentation

## 2016-10-11 ENCOUNTER — Telehealth: Payer: Self-pay | Admitting: Cardiovascular Disease

## 2016-10-11 DIAGNOSIS — H00021 Hordeolum internum right upper eyelid: Secondary | ICD-10-CM | POA: Diagnosis not present

## 2016-10-11 DIAGNOSIS — H00022 Hordeolum internum right lower eyelid: Secondary | ICD-10-CM | POA: Diagnosis not present

## 2016-10-11 NOTE — Telephone Encounter (Signed)
Returned the call to the patient. He stated that he has had a mild chest discomfort that comes and goes and no energy for the past several days. He did state that he does feel better this morning. He is not able to check his blood pressure at home. He stated that he has mild edema but goes away when he elevates his legs. He would like an appointment before his October appointment with Dr. Claiborne Billings. An appointment has been made with Almyra Deforest, PA on 9/12. He has been informed that if he feels worse and the chest discomfort does not get better or he develops shortness of breath or chest pain he should go to the ED for further assessment. He verbalized his understanding.

## 2016-10-11 NOTE — Telephone Encounter (Signed)
Pt have been suffering with Shortness of breath and pressure in his chest. He does not have any energy.Not having this at the [facility] like to be seen asap please.

## 2016-10-17 ENCOUNTER — Encounter: Payer: Self-pay | Admitting: Physician Assistant

## 2016-10-17 ENCOUNTER — Ambulatory Visit (INDEPENDENT_AMBULATORY_CARE_PROVIDER_SITE_OTHER): Payer: Medicare HMO | Admitting: Physician Assistant

## 2016-10-17 VITALS — BP 123/72 | HR 61 | Ht 65.0 in | Wt 227.8 lb

## 2016-10-17 DIAGNOSIS — R079 Chest pain, unspecified: Secondary | ICD-10-CM

## 2016-10-17 DIAGNOSIS — H2512 Age-related nuclear cataract, left eye: Secondary | ICD-10-CM | POA: Diagnosis not present

## 2016-10-17 DIAGNOSIS — L97322 Non-pressure chronic ulcer of left ankle with fat layer exposed: Secondary | ICD-10-CM | POA: Diagnosis not present

## 2016-10-17 DIAGNOSIS — I4892 Unspecified atrial flutter: Secondary | ICD-10-CM | POA: Diagnosis not present

## 2016-10-17 DIAGNOSIS — N184 Chronic kidney disease, stage 4 (severe): Secondary | ICD-10-CM | POA: Diagnosis not present

## 2016-10-17 DIAGNOSIS — E119 Type 2 diabetes mellitus without complications: Secondary | ICD-10-CM

## 2016-10-17 DIAGNOSIS — Z961 Presence of intraocular lens: Secondary | ICD-10-CM | POA: Diagnosis not present

## 2016-10-17 DIAGNOSIS — L97519 Non-pressure chronic ulcer of other part of right foot with unspecified severity: Secondary | ICD-10-CM | POA: Diagnosis not present

## 2016-10-17 DIAGNOSIS — L97312 Non-pressure chronic ulcer of right ankle with fat layer exposed: Secondary | ICD-10-CM | POA: Diagnosis not present

## 2016-10-17 DIAGNOSIS — J449 Chronic obstructive pulmonary disease, unspecified: Secondary | ICD-10-CM | POA: Diagnosis not present

## 2016-10-17 DIAGNOSIS — I10 Essential (primary) hypertension: Secondary | ICD-10-CM

## 2016-10-17 DIAGNOSIS — G4730 Sleep apnea, unspecified: Secondary | ICD-10-CM | POA: Diagnosis not present

## 2016-10-17 DIAGNOSIS — I87311 Chronic venous hypertension (idiopathic) with ulcer of right lower extremity: Secondary | ICD-10-CM | POA: Diagnosis not present

## 2016-10-17 DIAGNOSIS — G4733 Obstructive sleep apnea (adult) (pediatric): Secondary | ICD-10-CM | POA: Diagnosis not present

## 2016-10-17 MED ORDER — PANTOPRAZOLE SODIUM 40 MG PO TBEC: DELAYED_RELEASE_TABLET | ORAL | 6 refills | Status: DC

## 2016-10-17 NOTE — Telephone Encounter (Signed)
Lm Eliquis 5mg  at the front desk

## 2016-10-17 NOTE — Progress Notes (Signed)
Cardiology Office Note    Date:  10/19/2016   ID:  Kolbie, Lepkowski 1940/04/18, MRN [id number]  PCP:  Gayland Curry, DO  Cardiologist:  Dr. Claiborne Billings  Primary nephrologist: Dr. Jimmy Footman  Chief Complaint  Patient presents with  . Follow-up    seen for Dr. Claiborne Billings    History of Present Illness:  CLEBURNE SAVINI is a 76 y.o. male with PMH of severe COPD, DM II, CKD, HTN, OSA and atrial flutter. He has a history of lower extremity venous insufficiency. Previous venous Doppler did not show any evidence of thrombosis or thrombophlebitis, but he had bilateral deep venous insufficiency within the femoral and popliteal vein. Last echocardiogram obtained on 08/18/2015 showed EF 60-65%, moderate TR, PA peak pressure 40 mmHg. He was previously on Xarelto for atrial flutter, this has later transitioned to eliquis 5 mg twice a day for blood in the stool. His aspirin was discontinued around the same time. He was [facility] to [facility] in March 2018 with worsening shortness breath, orthopnea and weight gain. He was treated with IV Lasix and diuresed 7 pounds. He was transitioned to torsemide 20 mg twice a day. He was also empirically treated for possible CPAP. He was last seen by Dr. Claiborne Billings on 04/26/2016, he was doing well at the time.  He presents today for cardiology office visit. He required eliquis samples as a cause morning and $200. We did ask him to contact pharmaceutical commonly to see if they qualify for any medication assistance program. Otherwise he has also been complaining of some shortness of breath, fatigue and chest tightness. It does not always occurs with exertion. I recommended a Lexiscan Myoview to further workup the chest discomfort. He says is not truly a pain it is more of a tightness in the substernal area. Otherwise he he has no obvious lower extremity edema, orthopnea or PND. I am quite concerned that he his renal function is gradually deteriorating based on the recent lab work,  he does have a nephrologist at [facility] but cannot recall who the nephrologist is. According to the patient, his nephrologist is aware that he is taking torsemide. I do not see any other nephrotoxic drug under his medication list.   Past Medical History:  Diagnosis Date  . Allergy   . Asthma   . Atrophic gastritis   . Benign essential hypertension   . CHF (congestive heart failure) ([place])   . COPD (chronic obstructive pulmonary disease) ([place])   . Diabetes ([place])   . GERD (gastroesophageal reflux disease)   . Gout attack 09/2012  . Sinusitis, maxillary, chronic   . Type II or unspecified type diabetes mellitus without mention of complication, not stated as uncontrolled     Past Surgical History:  Procedure Laterality Date  . COLONOSCOPY  2003  . HERNIA REPAIR  1980    Current Medications: Outpatient Medications Prior to Visit  Medication Sig Dispense Refill  . acetaminophen (TYLENOL) 325 MG tablet Take 650 mg by mouth every 6 (six) hours as needed for mild pain.    Marland Kitchen albuterol (PROVENTIL) (2.5 MG/3ML) 0.083% nebulizer solution INHALE CONTENTS OF 1 VIAL IN NEBULIZER EVERY 4 HOURS AS NEEDED FOR WHEEZING AND ASTHMA 75 mL 5  . amiodarone (PACERONE) 200 MG tablet TAKE 1 TABLET TWICE A DAY 60 tablet 11  . apixaban (ELIQUIS) 5 MG TABS tablet Take 1 tablet (5 mg total) by mouth 2 (two) times daily. 60 tablet 1  . fluticasone (FLONASE)  50 MCG/ACT nasal spray PLACE 2 SPRAYS INTO BOTH NOSTRILS DAILY. 16 g 3  . KLOR-CON M20 20 MEQ tablet TAKE 1 TABLET BY MOUTH EVERY DAY 30 tablet 1  . levothyroxine (SYNTHROID, LEVOTHROID) 175 MCG tablet TAKE 1 TABLET EVERY DAY BEFORE BREAKFAST 30 tablet 3  . linagliptin (TRADJENTA) 5 MG TABS tablet Take 1 tablet (5 mg total) by mouth daily. 90 tablet 3  . montelukast (SINGULAIR) 10 MG tablet Take one tablet by mouth at bedtime as needed for allergies and asthma 90 tablet 0  . Multiple Vitamins-Minerals (CENTRUM SILVER ADULT 50+ PO) Take 1  tablet by mouth daily.    . pravastatin (PRAVACHOL) 20 MG tablet TAKE 1 TABLET BY MOUTH EVERY DAY WITH SUPPER 30 tablet 3  . silver sulfADIAZINE (SILVADENE) 1 % cream     . torsemide (DEMADEX) 20 MG tablet TAKE 1 TABLET TWICE A DAY 60 tablet 3  . pantoprazole (PROTONIX) 40 MG tablet TAKE 1 TABLET BY MOUTH EVERY DAY BEFORE SUPPER 90 tablet 1  . allopurinol (ZYLOPRIM) 300 MG tablet TAKE 1 TABLET BY MOUTH EVERY DAY 30 tablet 6  . fluticasone (FLONASE) 50 MCG/ACT nasal spray Place 1 spray into both nostrils daily as needed for allergies.     . fluticasone furoate-vilanterol (BREO ELLIPTA) 100-25 MCG/INH AEPB Inhale 1 puff into the lungs daily.     No facility-administered medications prior to visit.      Allergies:   Patient has no known allergies.   Social History   Social History  . Marital status: Married    Spouse name: N/A  . Number of children: 3  . Years of education: N/A   Occupational History  . Retired    Social History Main Topics  . Smoking status: Former Smoker    Packs/day: 1.00    Years: 20.00    Types: Cigarettes    Quit date: 06/17/1997  . Smokeless tobacco: Never Used  . Alcohol use No  . Drug use: No  . Sexual activity: Not Currently   Other Topics Concern  . None   Social History Narrative   Lives in 1 story apt with his wife, Jules Baty.  Sometimes exercises.  Drinks coffee.       Family History:  The patient's family history includes COPD in his sister and sister; Cancer in his sister; Diabetes in his sister; Stroke in his mother.   ROS:   Please see the history of present illness.    ROS All other systems reviewed and are negative.   PHYSICAL EXAM:   VS:  BP 123/72   Pulse 61   Ht 5\' 5"  (1.651 m)   Wt 227 lb 12.8 oz (103.3 kg)   BMI 37.91 kg/m    GEN: Well nourished, well developed, in no acute distress  HEENT: normal  Neck: no JVD, carotid bruits, or masses Cardiac: RRR; no murmurs, rubs, or gallops,no edema  Respiratory:  clear to  auscultation bilaterally, normal work of breathing GI: soft, nontender, nondistended, + BS MS: no deformity or atrophy  Skin: warm and dry, no rash Neuro:  Alert and Oriented x 3, Strength and sensation are intact Psych: euthymic mood, full affect  Wt Readings from Last 3 Encounters:  10/17/16 227 lb 12.8 oz (103.3 kg)  07/20/16 225 lb (102.1 kg)  05/17/16 236 lb (107 kg)      Studies/Labs Reviewed:   EKG:  EKG is ordered today.  The ekg ordered today demonstrates Sinus rhythm with prolonged first-degree AV  block. Reviewed with Dr. Oval Linsey  Recent Labs: 04/09/2016: B Natriuretic Peptide 460.5 05/28/2016: Hemoglobin 10.7; Platelets 160 07/20/2016: ALT 25; BUN 59; Creat 3.43; Potassium 4.1; Sodium 144; TSH 4.00   Lipid Panel    Component Value Date/Time   CHOL 162 03/28/2016 1015   CHOL 151 05/25/2015 0950   TRIG 41 03/28/2016 1015   HDL 84 03/28/2016 1015   HDL 68 05/25/2015 0950   CHOLHDL 1.9 03/28/2016 1015   VLDL 8 03/28/2016 1015   LDLCALC 70 03/28/2016 1015   LDLCALC 68 05/25/2015 0950    Additional studies/ records that were reviewed today include:   Echo 2017 LV EF: 60% -   65%  ------------------------------------------------------------------- Indications:      Respiratory Failure acute 518.81.  ------------------------------------------------------------------- History:   PMH:  Chronic Kidney Disease.  Dyspnea.  Chronic obstructive pulmonary disease.  Risk factors:  Hypertension. Diabetes mellitus. Dyslipidemia.  ------------------------------------------------------------------- Study Conclusions  - Left ventricle: The cavity size was normal. There was moderate   concentric hypertrophy. Systolic function was normal. The   estimated ejection fraction was in the range of 60% to 65%. Wall   motion was normal; there were no regional wall motion   abnormalities. - Left atrium: The atrium was moderately dilated. - Right ventricle: The cavity size was  mildly dilated. Wall   thickness was increased. - Right atrium: The atrium was moderately dilated. - Tricuspid valve: There was moderate regurgitation. - Pulmonary arteries: Systolic pressure was mildly increased. PA   peak pressure: 40 mm Hg (S).    ASSESSMENT:    1. Chest pain, unspecified type   2. Atrial flutter, unspecified type ([place])   3. Chronic kidney disease (CKD), stage IV (severe) ([place])   4. OSA (obstructive sleep apnea)   5. Essential hypertension   6. Controlled type 2 diabetes mellitus without complication, without long-term current use of insulin ([place])   7. Chronic obstructive pulmonary disease, unspecified COPD type ([place])      PLAN:  In order of problems listed above:  1. Chest pain: Somewhat atypical, does not have strong correlation with exertion. Given worsening renal function, he would not be a good candidate for cardiac catheterization. I recommended outpatient stress testing to further evaluate.  2. Atrial flutter on eliquis: Appears to be in sinus rhythm but has a very prolonged first-degree AV block on EKG. I reviewed his EKG with DOD Dr. Oval Linsey also agrees this is very unlikely to be atrial fibrillation or atrial flutter. Continue amiodarone for now.  3. Hypertension: Blood pressure well-controlled  4. DM 2: We'll defer management to primary care provider especially in light of worsening renal function  5. CKD stage IV: His renal function has been gradually deteriorating over the past year. Last creatinine > 3. According to the patient, he does have a nephrologist at [place], but he cannot recall the name. I have reviewed outside record sent in June by Dr. Deterding's office, it was felt from nephrology perspective, he likely will progress to ESRD in a year  6. Severe COPD: I wonder if some of his shortness of breath is coming from the COPD    Medication Adjustments/Labs and Tests Ordered: Current medicines are reviewed at length  with the patient today.  Concerns regarding medicines are outlined above.  Medication changes, Labs and Tests ordered today are listed in the Patient Instructions below. Patient Instructions  Medication Instructions:  Your physician recommends that you continue on your current medications as directed. Please refer to the  Current Medication list given to you today.  Labwork: NONE  Testing/Procedures: Your physician has requested that you have a lexiscan myoview. For further information please visit [email or web address]. Please follow instruction sheet, as given.  Follow-Up: Keep follow up with Dr. Claiborne Billings in October  Any Other Special Instructions Will Be Listed Below (If Applicable).  Eliquis Assistance: [phone]   If you need a refill on your cardiac medications before your next appointment, please call your pharmacy.      Hilbert Corrigan, [place]  10/19/2016 6:36 AM    [facility] [place], [place], [place]  [address] Phone: [phone]; Fax: [phone]

## 2016-10-17 NOTE — Patient Instructions (Signed)
Medication Instructions:  Your physician recommends that you continue on your current medications as directed. Please refer to the Current Medication list given to you today.  Labwork: NONE  Testing/Procedures: Your physician has requested that you have a lexiscan myoview. For further information please visit [email or web address]. Please follow instruction sheet, as given.  Follow-Up: Keep follow up with Dr. Claiborne Billings in October  Any Other Special Instructions Will Be Listed Below (If Applicable).  Eliquis Assistance: [phone]   If you need a refill on your cardiac medications before your next appointment, please call your pharmacy.

## 2016-10-18 ENCOUNTER — Telehealth (HOSPITAL_COMMUNITY): Payer: Self-pay

## 2016-10-18 NOTE — Telephone Encounter (Signed)
Encounter complete. 

## 2016-10-19 ENCOUNTER — Encounter: Payer: Self-pay | Admitting: Physician Assistant

## 2016-10-22 DIAGNOSIS — I509 Heart failure, unspecified: Secondary | ICD-10-CM | POA: Diagnosis not present

## 2016-10-22 DIAGNOSIS — M109 Gout, unspecified: Secondary | ICD-10-CM | POA: Diagnosis not present

## 2016-10-22 DIAGNOSIS — Z23 Encounter for immunization: Secondary | ICD-10-CM | POA: Diagnosis not present

## 2016-10-22 DIAGNOSIS — D631 Anemia in chronic kidney disease: Secondary | ICD-10-CM | POA: Diagnosis not present

## 2016-10-22 DIAGNOSIS — E039 Hypothyroidism, unspecified: Secondary | ICD-10-CM | POA: Diagnosis not present

## 2016-10-22 DIAGNOSIS — E1129 Type 2 diabetes mellitus with other diabetic kidney complication: Secondary | ICD-10-CM | POA: Diagnosis not present

## 2016-10-22 DIAGNOSIS — I4891 Unspecified atrial fibrillation: Secondary | ICD-10-CM | POA: Diagnosis not present

## 2016-10-22 DIAGNOSIS — N2581 Secondary hyperparathyroidism of renal origin: Secondary | ICD-10-CM | POA: Diagnosis not present

## 2016-10-22 DIAGNOSIS — I129 Hypertensive chronic kidney disease with stage 1 through stage 4 chronic kidney disease, or unspecified chronic kidney disease: Secondary | ICD-10-CM | POA: Diagnosis not present

## 2016-10-22 DIAGNOSIS — N184 Chronic kidney disease, stage 4 (severe): Secondary | ICD-10-CM | POA: Diagnosis not present

## 2016-10-23 ENCOUNTER — Ambulatory Visit (HOSPITAL_COMMUNITY)
Admission: RE | Admit: 2016-10-23 | Discharge: 2016-10-23 | Disposition: A | Discharge status: Home / Self Care | Payer: Medicare HMO | Source: Ambulatory Visit | Attending: Cardiovascular Disease | Admitting: Cardiovascular Disease

## 2016-10-23 DIAGNOSIS — R079 Chest pain, unspecified: Secondary | ICD-10-CM | POA: Insufficient documentation

## 2016-10-23 DIAGNOSIS — J449 Chronic obstructive pulmonary disease, unspecified: Secondary | ICD-10-CM | POA: Insufficient documentation

## 2016-10-23 DIAGNOSIS — Z87891 Personal history of nicotine dependence: Secondary | ICD-10-CM | POA: Insufficient documentation

## 2016-10-23 DIAGNOSIS — I4891 Unspecified atrial fibrillation: Secondary | ICD-10-CM | POA: Diagnosis not present

## 2016-10-23 DIAGNOSIS — E669 Obesity, unspecified: Secondary | ICD-10-CM | POA: Insufficient documentation

## 2016-10-23 DIAGNOSIS — N184 Chronic kidney disease, stage 4 (severe): Secondary | ICD-10-CM | POA: Insufficient documentation

## 2016-10-23 DIAGNOSIS — G4733 Obstructive sleep apnea (adult) (pediatric): Secondary | ICD-10-CM | POA: Diagnosis not present

## 2016-10-23 DIAGNOSIS — I509 Heart failure, unspecified: Secondary | ICD-10-CM | POA: Diagnosis not present

## 2016-10-23 DIAGNOSIS — I4892 Unspecified atrial flutter: Secondary | ICD-10-CM | POA: Insufficient documentation

## 2016-10-23 DIAGNOSIS — E1122 Type 2 diabetes mellitus with diabetic chronic kidney disease: Secondary | ICD-10-CM | POA: Diagnosis not present

## 2016-10-23 DIAGNOSIS — I130 Hypertensive heart and chronic kidney disease with heart failure and stage 1 through stage 4 chronic kidney disease, or unspecified chronic kidney disease: Secondary | ICD-10-CM | POA: Insufficient documentation

## 2016-10-23 LAB — MYOCARDIAL PERFUSION IMAGING
CHL CUP RESTING HR STRESS: 59 {beats}/min
Peak HR: 61 {beats}/min

## 2016-10-23 MED ORDER — TECHNETIUM TC 99M TETROFOSMIN IV KIT
32.6000 | PACK | Freq: Once | INTRAVENOUS | Status: AC | PRN
  Administered 2016-10-23: 32.6 via INTRAVENOUS
  Filled 2016-10-23: qty 33

## 2016-10-23 MED ORDER — REGADENOSON 0.4 MG/5ML IV SOLN
0.4000 mg | Freq: Once | INTRAVENOUS | Status: AC
  Administered 2016-10-23: 0.4 mg via INTRAVENOUS

## 2016-10-23 MED ORDER — TECHNETIUM TC 99M TETROFOSMIN IV KIT
10.4000 | PACK | Freq: Once | INTRAVENOUS | Status: AC | PRN
  Administered 2016-10-23: 10.4 via INTRAVENOUS
  Filled 2016-10-23: qty 11

## 2016-10-23 NOTE — Progress Notes (Signed)
Overall low risk study, no ischemia, but pumping function is slightly lower than expected. This type of stress test is not as accurate as echo, recommend repeat echo

## 2016-10-24 ENCOUNTER — Other Ambulatory Visit: Payer: Self-pay | Admitting: Internal Medicine

## 2016-10-24 DIAGNOSIS — Z961 Presence of intraocular lens: Secondary | ICD-10-CM | POA: Diagnosis not present

## 2016-10-24 DIAGNOSIS — H2512 Age-related nuclear cataract, left eye: Secondary | ICD-10-CM | POA: Diagnosis not present

## 2016-10-24 DIAGNOSIS — R233 Spontaneous ecchymoses: Secondary | ICD-10-CM | POA: Diagnosis not present

## 2016-10-25 ENCOUNTER — Telehealth: Payer: Self-pay | Admitting: Cardiovascular Disease

## 2016-10-25 DIAGNOSIS — M96841 Postprocedural hematoma of a musculoskeletal structure following other procedure: Secondary | ICD-10-CM | POA: Diagnosis not present

## 2016-10-25 DIAGNOSIS — D485 Neoplasm of uncertain behavior of skin: Secondary | ICD-10-CM | POA: Diagnosis not present

## 2016-10-25 DIAGNOSIS — H02411 Mechanical ptosis of right eyelid: Secondary | ICD-10-CM | POA: Diagnosis not present

## 2016-10-25 NOTE — Telephone Encounter (Signed)
Received incoming records from [facility] for upcoming appointment on 11/29/16 @ 9am with Dr. Claiborne Billings. Records given to [facility] in Medical Records. 10/25/16 ab

## 2016-10-26 ENCOUNTER — Other Ambulatory Visit: Payer: Self-pay | Admitting: Ophthalmology

## 2016-10-26 DIAGNOSIS — D485 Neoplasm of uncertain behavior of skin: Secondary | ICD-10-CM | POA: Diagnosis not present

## 2016-10-26 DIAGNOSIS — R402441 Other coma, without documented Glasgow coma scale score, or with partial score reported, in the field [EMT or ambulance]: Secondary | ICD-10-CM | POA: Diagnosis not present

## 2016-10-26 DIAGNOSIS — I499 Cardiac arrhythmia, unspecified: Secondary | ICD-10-CM | POA: Diagnosis not present

## 2016-10-26 DIAGNOSIS — R001 Bradycardia, unspecified: Secondary | ICD-10-CM | POA: Diagnosis not present

## 2016-10-26 DIAGNOSIS — S0011XA Contusion of right eyelid and periocular area, initial encounter: Secondary | ICD-10-CM | POA: Diagnosis not present

## 2016-10-26 DIAGNOSIS — D2311 Other benign neoplasm of skin of right eyelid, including canthus: Secondary | ICD-10-CM | POA: Diagnosis not present

## 2016-10-27 ENCOUNTER — Encounter (HOSPITAL_COMMUNITY): Payer: Self-pay | Admitting: Emergency Medicine

## 2016-10-27 ENCOUNTER — Inpatient Hospital Stay (HOSPITAL_COMMUNITY): Payer: Medicare HMO

## 2016-10-27 ENCOUNTER — Emergency Department (HOSPITAL_COMMUNITY): Payer: Medicare HMO

## 2016-10-27 ENCOUNTER — Inpatient Hospital Stay (HOSPITAL_COMMUNITY)
Admission: EM | Admit: 2016-10-27 | Discharge: 2016-11-09 | DRG: 870 | Disposition: A | Discharge status: Home Health | Payer: Medicare HMO | Attending: Internal Medicine | Admitting: Internal Medicine

## 2016-10-27 DIAGNOSIS — I509 Heart failure, unspecified: Secondary | ICD-10-CM | POA: Diagnosis not present

## 2016-10-27 DIAGNOSIS — D6489 Other specified anemias: Secondary | ICD-10-CM | POA: Diagnosis present

## 2016-10-27 DIAGNOSIS — N184 Chronic kidney disease, stage 4 (severe): Secondary | ICD-10-CM | POA: Diagnosis present

## 2016-10-27 DIAGNOSIS — J9690 Respiratory failure, unspecified, unspecified whether with hypoxia or hypercapnia: Secondary | ICD-10-CM | POA: Diagnosis not present

## 2016-10-27 DIAGNOSIS — R570 Cardiogenic shock: Secondary | ICD-10-CM | POA: Diagnosis present

## 2016-10-27 DIAGNOSIS — E785 Hyperlipidemia, unspecified: Secondary | ICD-10-CM | POA: Diagnosis present

## 2016-10-27 DIAGNOSIS — E1122 Type 2 diabetes mellitus with diabetic chronic kidney disease: Secondary | ICD-10-CM | POA: Diagnosis present

## 2016-10-27 DIAGNOSIS — I959 Hypotension, unspecified: Secondary | ICD-10-CM | POA: Diagnosis not present

## 2016-10-27 DIAGNOSIS — Z4659 Encounter for fitting and adjustment of other gastrointestinal appliance and device: Secondary | ICD-10-CM

## 2016-10-27 DIAGNOSIS — R579 Shock, unspecified: Secondary | ICD-10-CM | POA: Diagnosis not present

## 2016-10-27 DIAGNOSIS — J9600 Acute respiratory failure, unspecified whether with hypoxia or hypercapnia: Secondary | ICD-10-CM | POA: Diagnosis not present

## 2016-10-27 DIAGNOSIS — I4510 Unspecified right bundle-branch block: Secondary | ICD-10-CM | POA: Diagnosis present

## 2016-10-27 DIAGNOSIS — I5033 Acute on chronic diastolic (congestive) heart failure: Secondary | ICD-10-CM | POA: Diagnosis not present

## 2016-10-27 DIAGNOSIS — R109 Unspecified abdominal pain: Secondary | ICD-10-CM | POA: Diagnosis present

## 2016-10-27 DIAGNOSIS — I130 Hypertensive heart and chronic kidney disease with heart failure and stage 1 through stage 4 chronic kidney disease, or unspecified chronic kidney disease: Secondary | ICD-10-CM | POA: Diagnosis present

## 2016-10-27 DIAGNOSIS — Z452 Encounter for adjustment and management of vascular access device: Secondary | ICD-10-CM

## 2016-10-27 DIAGNOSIS — Z7189 Other specified counseling: Secondary | ICD-10-CM

## 2016-10-27 DIAGNOSIS — J189 Pneumonia, unspecified organism: Secondary | ICD-10-CM | POA: Diagnosis not present

## 2016-10-27 DIAGNOSIS — I4891 Unspecified atrial fibrillation: Secondary | ICD-10-CM

## 2016-10-27 DIAGNOSIS — J9601 Acute respiratory failure with hypoxia: Secondary | ICD-10-CM | POA: Diagnosis present

## 2016-10-27 DIAGNOSIS — Z01818 Encounter for other preprocedural examination: Secondary | ICD-10-CM

## 2016-10-27 DIAGNOSIS — R451 Restlessness and agitation: Secondary | ICD-10-CM | POA: Diagnosis not present

## 2016-10-27 DIAGNOSIS — J449 Chronic obstructive pulmonary disease, unspecified: Secondary | ICD-10-CM | POA: Diagnosis present

## 2016-10-27 DIAGNOSIS — Z978 Presence of other specified devices: Secondary | ICD-10-CM

## 2016-10-27 DIAGNOSIS — J90 Pleural effusion, not elsewhere classified: Secondary | ICD-10-CM | POA: Diagnosis not present

## 2016-10-27 DIAGNOSIS — I248 Other forms of acute ischemic heart disease: Secondary | ICD-10-CM | POA: Diagnosis present

## 2016-10-27 DIAGNOSIS — Y95 Nosocomial condition: Secondary | ICD-10-CM | POA: Diagnosis not present

## 2016-10-27 DIAGNOSIS — I480 Paroxysmal atrial fibrillation: Secondary | ICD-10-CM | POA: Diagnosis present

## 2016-10-27 DIAGNOSIS — E039 Hypothyroidism, unspecified: Secondary | ICD-10-CM | POA: Diagnosis present

## 2016-10-27 DIAGNOSIS — G9340 Encephalopathy, unspecified: Secondary | ICD-10-CM | POA: Diagnosis present

## 2016-10-27 DIAGNOSIS — Z23 Encounter for immunization: Secondary | ICD-10-CM | POA: Diagnosis not present

## 2016-10-27 DIAGNOSIS — Z66 Do not resuscitate: Secondary | ICD-10-CM | POA: Diagnosis not present

## 2016-10-27 DIAGNOSIS — A419 Sepsis, unspecified organism: Principal | ICD-10-CM | POA: Diagnosis present

## 2016-10-27 DIAGNOSIS — R001 Bradycardia, unspecified: Secondary | ICD-10-CM | POA: Diagnosis not present

## 2016-10-27 DIAGNOSIS — E11649 Type 2 diabetes mellitus with hypoglycemia without coma: Secondary | ICD-10-CM | POA: Diagnosis present

## 2016-10-27 DIAGNOSIS — E874 Mixed disorder of acid-base balance: Secondary | ICD-10-CM | POA: Diagnosis present

## 2016-10-27 DIAGNOSIS — J9811 Atelectasis: Secondary | ICD-10-CM | POA: Diagnosis present

## 2016-10-27 DIAGNOSIS — G4733 Obstructive sleep apnea (adult) (pediatric): Secondary | ICD-10-CM | POA: Diagnosis present

## 2016-10-27 DIAGNOSIS — J690 Pneumonitis due to inhalation of food and vomit: Secondary | ICD-10-CM | POA: Diagnosis not present

## 2016-10-27 DIAGNOSIS — Z6834 Body mass index (BMI) 34.0-34.9, adult: Secondary | ICD-10-CM

## 2016-10-27 DIAGNOSIS — I4892 Unspecified atrial flutter: Secondary | ICD-10-CM | POA: Diagnosis present

## 2016-10-27 DIAGNOSIS — N189 Chronic kidney disease, unspecified: Secondary | ICD-10-CM

## 2016-10-27 DIAGNOSIS — N179 Acute kidney failure, unspecified: Secondary | ICD-10-CM | POA: Diagnosis not present

## 2016-10-27 DIAGNOSIS — R9431 Abnormal electrocardiogram [ECG] [EKG]: Secondary | ICD-10-CM | POA: Diagnosis not present

## 2016-10-27 DIAGNOSIS — E669 Obesity, unspecified: Secondary | ICD-10-CM | POA: Diagnosis present

## 2016-10-27 DIAGNOSIS — D696 Thrombocytopenia, unspecified: Secondary | ICD-10-CM | POA: Diagnosis present

## 2016-10-27 DIAGNOSIS — Z4682 Encounter for fitting and adjustment of non-vascular catheter: Secondary | ICD-10-CM | POA: Diagnosis not present

## 2016-10-27 DIAGNOSIS — I469 Cardiac arrest, cause unspecified: Secondary | ICD-10-CM | POA: Diagnosis not present

## 2016-10-27 DIAGNOSIS — R4020 Unspecified coma: Secondary | ICD-10-CM | POA: Diagnosis not present

## 2016-10-27 HISTORY — DX: Acute kidney failure, unspecified: N17.9

## 2016-10-27 HISTORY — DX: Unspecified atrial fibrillation: I48.91

## 2016-10-27 HISTORY — DX: Acute respiratory failure, unspecified whether with hypoxia or hypercapnia: J96.00

## 2016-10-27 HISTORY — DX: Chronic kidney disease, unspecified: N18.9

## 2016-10-27 HISTORY — DX: Type 2 diabetes mellitus without complications: E11.9

## 2016-10-27 HISTORY — DX: Disorder of thyroid, unspecified: E07.9

## 2016-10-27 LAB — URINALYSIS, ROUTINE W REFLEX MICROSCOPIC
BILIRUBIN URINE: NEGATIVE
GLUCOSE, UA: NEGATIVE mg/dL
Hgb urine dipstick: NEGATIVE
KETONES UR: NEGATIVE mg/dL
LEUKOCYTES UA: NEGATIVE
Nitrite: NEGATIVE
PH: 6 (ref 5.0–8.0)
Protein, ur: 100 mg/dL — AB
SPECIFIC GRAVITY, URINE: 1.013 (ref 1.005–1.030)
SQUAMOUS EPITHELIAL / LPF: NONE SEEN

## 2016-10-27 LAB — CBC WITH DIFFERENTIAL/PLATELET
BASOS PCT: 0 %
Basophils Absolute: 0 10*3/uL (ref 0.0–0.1)
Basophils Absolute: 0 10*3/uL (ref 0.0–0.1)
Basophils Relative: 0 %
EOS ABS: 0 10*3/uL (ref 0.0–0.7)
Eosinophils Absolute: 0 10*3/uL (ref 0.0–0.7)
Eosinophils Relative: 0 %
Eosinophils Relative: 0 %
HCT: 35.6 % — ABNORMAL LOW (ref 39.0–52.0)
HEMATOCRIT: 40.9 % (ref 39.0–52.0)
HEMOGLOBIN: 11.3 g/dL — AB (ref 13.0–17.0)
HEMOGLOBIN: 11.7 g/dL — AB (ref 13.0–17.0)
LYMPHS ABS: 0.6 10*3/uL — AB (ref 0.7–4.0)
LYMPHS PCT: 5 %
Lymphocytes Relative: 3 %
Lymphs Abs: 0.5 10*3/uL — ABNORMAL LOW (ref 0.7–4.0)
MCH: 31 pg (ref 26.0–34.0)
MCH: 31.2 pg (ref 26.0–34.0)
MCHC: 28.6 g/dL — AB (ref 30.0–36.0)
MCHC: 31.7 g/dL (ref 30.0–36.0)
MCV: 109.1 fL — AB (ref 78.0–100.0)
MCV: 97.5 fL (ref 78.0–100.0)
MONOS PCT: 14 %
Monocytes Absolute: 1.2 10*3/uL — ABNORMAL HIGH (ref 0.1–1.0)
Monocytes Absolute: 1.6 10*3/uL — ABNORMAL HIGH (ref 0.1–1.0)
Monocytes Relative: 7 %
NEUTROS ABS: 9 10*3/uL — AB (ref 1.7–7.7)
NEUTROS PCT: 81 %
NEUTROS PCT: 90 %
Neutro Abs: 14.8 10*3/uL — ABNORMAL HIGH (ref 1.7–7.7)
Platelets: 124 10*3/uL — ABNORMAL LOW (ref 150–400)
Platelets: 145 10*3/uL — ABNORMAL LOW (ref 150–400)
RBC: 3.65 MIL/uL — AB (ref 4.22–5.81)
RBC: 3.75 MIL/uL — AB (ref 4.22–5.81)
RDW: 15.7 % — ABNORMAL HIGH (ref 11.5–15.5)
RDW: 16.8 % — ABNORMAL HIGH (ref 11.5–15.5)
WBC: 11.2 10*3/uL — AB (ref 4.0–10.5)
WBC: 16.6 10*3/uL — AB (ref 4.0–10.5)

## 2016-10-27 LAB — SURGICAL PCR SCREEN
MRSA, PCR: NEGATIVE
Staphylococcus aureus: POSITIVE — AB

## 2016-10-27 LAB — LACTIC ACID, PLASMA
LACTIC ACID, VENOUS: 2.9 mmol/L — AB (ref 0.5–1.9)
Lactic Acid, Venous: 11.4 mmol/L (ref 0.5–1.9)
Lactic Acid, Venous: 2.3 mmol/L (ref 0.5–1.9)

## 2016-10-27 LAB — BASIC METABOLIC PANEL
Anion gap: 11 (ref 5–15)
BUN: 59 mg/dL — AB (ref 6–20)
CHLORIDE: 102 mmol/L (ref 101–111)
CO2: 24 mmol/L (ref 22–32)
CREATININE: 4.71 mg/dL — AB (ref 0.61–1.24)
Calcium: 8.6 mg/dL — ABNORMAL LOW (ref 8.9–10.3)
GFR calc Af Amer: 13 mL/min — ABNORMAL LOW (ref >60)
GFR calc non Af Amer: 11 mL/min — ABNORMAL LOW (ref >60)
Glucose, Bld: 181 mg/dL — ABNORMAL HIGH (ref 65–99)
POTASSIUM: 3.7 mmol/L (ref 3.5–5.1)
SODIUM: 137 mmol/L (ref 135–145)

## 2016-10-27 LAB — I-STAT ARTERIAL BLOOD GAS, ED
ACID-BASE DEFICIT: 11 mmol/L — AB (ref 0.0–2.0)
Bicarbonate: 16.2 mmol/L — ABNORMAL LOW (ref 20.0–28.0)
O2 SAT: 96 %
PCO2 ART: 39.1 mmHg (ref 32.0–48.0)
PH ART: 7.22 — AB (ref 7.350–7.450)
TCO2: 17 mmol/L — ABNORMAL LOW (ref 22–32)
pO2, Arterial: 92 mmHg (ref 83.0–108.0)

## 2016-10-27 LAB — POCT I-STAT 3, ART BLOOD GAS (G3+)
Acid-Base Excess: 2 mmol/L (ref 0.0–2.0)
Acid-base deficit: 2 mmol/L (ref 0.0–2.0)
BICARBONATE: 23.6 mmol/L (ref 20.0–28.0)
Bicarbonate: 25 mmol/L (ref 20.0–28.0)
O2 Saturation: 94 %
O2 Saturation: 96 %
PCO2 ART: 44.7 mmHg (ref 32.0–48.0)
PCO2 ART: 33.7 mmHg (ref 32.0–48.0)
PH ART: 7.335 — AB (ref 7.350–7.450)
PH ART: 7.482 — AB (ref 7.350–7.450)
Patient temperature: 38
TCO2: 25 mmol/L (ref 22–32)
TCO2: 26 mmol/L (ref 22–32)
pO2, Arterial: 79 mmHg — ABNORMAL LOW (ref 83.0–108.0)
pO2, Arterial: 80 mmHg — ABNORMAL LOW (ref 83.0–108.0)

## 2016-10-27 LAB — COMPREHENSIVE METABOLIC PANEL
ALK PHOS: 169 U/L — AB (ref 38–126)
ALT: 290 U/L — AB (ref 17–63)
AST: 361 U/L — ABNORMAL HIGH (ref 15–41)
Albumin: 3.4 g/dL — ABNORMAL LOW (ref 3.5–5.0)
Anion gap: 20 — ABNORMAL HIGH (ref 5–15)
BILIRUBIN TOTAL: 1.9 mg/dL — AB (ref 0.3–1.2)
BUN: 44 mg/dL — ABNORMAL HIGH (ref 6–20)
CALCIUM: 9.7 mg/dL (ref 8.9–10.3)
CO2: 22 mmol/L (ref 22–32)
CREATININE: 3.96 mg/dL — AB (ref 0.61–1.24)
Chloride: 100 mmol/L — ABNORMAL LOW (ref 101–111)
GFR calc non Af Amer: 13 mL/min — ABNORMAL LOW (ref >60)
GFR, EST AFRICAN AMERICAN: 15 mL/min — AB (ref >60)
Glucose, Bld: 100 mg/dL — ABNORMAL HIGH (ref 65–99)
Potassium: 4.9 mmol/L (ref 3.5–5.1)
SODIUM: 142 mmol/L (ref 135–145)
TOTAL PROTEIN: 6.9 g/dL (ref 6.5–8.1)

## 2016-10-27 LAB — PROTIME-INR
INR: 2.03
PROTHROMBIN TIME: 22.8 s — AB (ref 11.4–15.2)

## 2016-10-27 LAB — GLUCOSE, CAPILLARY
GLUCOSE-CAPILLARY: 175 mg/dL — AB (ref 65–99)
GLUCOSE-CAPILLARY: 114 mg/dL — AB (ref 65–99)

## 2016-10-27 LAB — I-STAT TROPONIN, ED: Troponin i, poc: 0.15 ng/mL (ref 0.00–0.08)

## 2016-10-27 LAB — CBC
HCT: 35.7 % — ABNORMAL LOW (ref 39.0–52.0)
HEMATOCRIT: 41 % (ref 39.0–52.0)
Hemoglobin: 12 g/dL — ABNORMAL LOW (ref 13.0–17.0)
Hemoglobin: 11.4 g/dL — ABNORMAL LOW (ref 13.0–17.0)
MCH: 31.6 pg (ref 26.0–34.0)
MCH: 31.7 pg (ref 26.0–34.0)
MCHC: 29.3 g/dL — ABNORMAL LOW (ref 30.0–36.0)
MCHC: 31.9 g/dL (ref 30.0–36.0)
MCV: 107.9 fL — AB (ref 78.0–100.0)
MCV: 99.2 fL (ref 78.0–100.0)
Platelets: 139 10*3/uL — ABNORMAL LOW (ref 150–400)
Platelets: 113 10*3/uL — ABNORMAL LOW (ref 150–400)
RBC: 3.8 MIL/uL — ABNORMAL LOW (ref 4.22–5.81)
RBC: 3.6 MIL/uL — ABNORMAL LOW (ref 4.22–5.81)
RDW: 16.9 % — AB (ref 11.5–15.5)
RDW: 16.1 % — ABNORMAL HIGH (ref 11.5–15.5)
WBC: 18.3 10*3/uL — AB (ref 4.0–10.5)
WBC: 15.2 10*3/uL — ABNORMAL HIGH (ref 4.0–10.5)

## 2016-10-27 LAB — ABO/RH: ABO/RH(D): A POS

## 2016-10-27 LAB — COOXEMETRY PANEL
Carboxyhemoglobin: 0.8 % (ref 0.5–1.5)
Methemoglobin: 1.1 % (ref 0.0–1.5)
O2 SAT: 72.8 %
TOTAL HEMOGLOBIN: 11.7 g/dL — AB (ref 12.0–16.0)

## 2016-10-27 LAB — ACETAMINOPHEN LEVEL

## 2016-10-27 LAB — CREATININE, SERUM
Creatinine, Ser: 4.23 mg/dL — ABNORMAL HIGH (ref 0.61–1.24)
GFR calc non Af Amer: 12 mL/min — ABNORMAL LOW (ref >60)
GFR, EST AFRICAN AMERICAN: 14 mL/min — AB (ref >60)

## 2016-10-27 LAB — I-STAT CG4 LACTIC ACID, ED: Lactic Acid, Venous: 9.63 mmol/L (ref 0.5–1.9)

## 2016-10-27 LAB — VANCOMYCIN, RANDOM: Vancomycin Rm: 15

## 2016-10-27 LAB — TYPE AND SCREEN
ABO/RH(D): A POS
ANTIBODY SCREEN: NEGATIVE

## 2016-10-27 LAB — SALICYLATE LEVEL: Salicylate Lvl: <7 mg/dL (ref 2.8–30.0)

## 2016-10-27 LAB — ETHANOL: Alcohol, Ethyl (B): <5 mg/dL (ref <5)

## 2016-10-27 LAB — ECHOCARDIOGRAM COMPLETE
HEIGHTINCHES: 68 in
WEIGHTICAEL: 3527.36 [oz_av]

## 2016-10-27 MED ORDER — FENTANYL 2500MCG IN NS 250ML (10MCG/ML) PREMIX INFUSION
10.0000 ug/h | INTRAVENOUS | Status: DC
  Administered 2016-10-28 – 2016-10-29 (×3): 300 ug/h via INTRAVENOUS
  Filled 2016-10-27 (×3): qty 250

## 2016-10-27 MED ORDER — FENTANYL 2500MCG IN NS 250ML (10MCG/ML) PREMIX INFUSION
10.0000 ug/h | INTRAVENOUS | Status: DC
  Administered 2016-10-27: 50 ug/h via INTRAVENOUS
  Administered 2016-10-27: 175 ug/h via INTRAVENOUS
  Filled 2016-10-27 (×2): qty 250

## 2016-10-27 MED ORDER — MIDAZOLAM HCL 2 MG/2ML IJ SOLN
INTRAMUSCULAR | Status: AC
  Filled 2016-10-27: qty 4

## 2016-10-27 MED ORDER — CHLORHEXIDINE GLUCONATE CLOTH 2 % EX PADS
6.0000 | MEDICATED_PAD | Freq: Every day | CUTANEOUS | Status: DC
  Administered 2016-10-27 – 2016-10-29 (×3): 6 via TOPICAL

## 2016-10-27 MED ORDER — GLUCAGON HCL RDNA (DIAGNOSTIC) 1 MG IJ SOLR
1.0000 mg/h | INTRAVENOUS | Status: DC
  Administered 2016-10-27 – 2016-10-29 (×10): 1 mg/h via INTRAVENOUS
  Filled 2016-10-27 (×13): qty 5

## 2016-10-27 MED ORDER — SODIUM CHLORIDE 0.9 % IV SOLN
INTRAVENOUS | Status: DC
  Administered 2016-10-27 – 2016-11-06 (×3): via INTRAVENOUS

## 2016-10-27 MED ORDER — MIDAZOLAM HCL 5 MG/5ML IJ SOLN
INTRAMUSCULAR | Status: DC | PRN
  Administered 2016-10-27 (×2): 4 mg via INTRAVENOUS

## 2016-10-27 MED ORDER — PIPERACILLIN-TAZOBACTAM 3.375 G IVPB 30 MIN: 3.3750 g | Freq: Two times a day (BID) | INTRAVENOUS | Status: DC

## 2016-10-27 MED ORDER — PANTOPRAZOLE SODIUM 40 MG IV SOLR
40.0000 mg | Freq: Every day | INTRAVENOUS | Status: DC
  Administered 2016-10-27 – 2016-10-30 (×4): 40 mg via INTRAVENOUS
  Filled 2016-10-27 (×4): qty 40

## 2016-10-27 MED ORDER — SODIUM CHLORIDE 0.9 % IV SOLN: 250.0000 mL | INTRAVENOUS | Status: DC | PRN

## 2016-10-27 MED ORDER — MIDAZOLAM HCL 2 MG/2ML IJ SOLN
4.0000 mg | INTRAMUSCULAR | Status: DC | PRN
  Administered 2016-10-27 (×2): 2 mg via INTRAVENOUS
  Administered 2016-10-27 – 2016-10-28 (×4): 4 mg via INTRAVENOUS
  Filled 2016-10-27 (×4): qty 4

## 2016-10-27 MED ORDER — VANCOMYCIN HCL IN DEXTROSE 1-5 GM/200ML-% IV SOLN
1000.0000 mg | Freq: Once | INTRAVENOUS | Status: AC
  Administered 2016-10-27: 1000 mg via INTRAVENOUS
  Filled 2016-10-27: qty 200

## 2016-10-27 MED ORDER — EPINEPHRINE PF 1 MG/ML IJ SOLN
0.5000 ug/min | INTRAMUSCULAR | Status: DC
  Administered 2016-10-27: 5 ug/min via INTRAVENOUS
  Administered 2016-10-27 – 2016-10-29 (×5): 10 ug/min via INTRAVENOUS
  Administered 2016-10-29: 11 ug/min via INTRAVENOUS
  Administered 2016-10-29 (×2): 10 ug/min via INTRAVENOUS
  Administered 2016-10-30: 12 ug/min via INTRAVENOUS
  Administered 2016-10-30: 6 ug/min via INTRAVENOUS
  Filled 2016-10-27 (×12): qty 4

## 2016-10-27 MED ORDER — CALCIUM CHLORIDE 10 % IV SOLN
INTRAVENOUS | Status: AC | PRN
  Administered 2016-10-27: 1 g via INTRAVENOUS

## 2016-10-27 MED ORDER — FENTANYL CITRATE (PF) 100 MCG/2ML IJ SOLN
INTRAMUSCULAR | Status: AC
  Administered 2016-10-27: 100 ug
  Filled 2016-10-27: qty 2

## 2016-10-27 MED ORDER — FENTANYL CITRATE (PF) 100 MCG/2ML IJ SOLN
INTRAMUSCULAR | Status: DC | PRN
  Administered 2016-10-27: 50 ug via INTRAVENOUS
  Administered 2016-10-30: 25 ug via INTRAVENOUS

## 2016-10-27 MED ORDER — CHLORHEXIDINE GLUCONATE 0.12% ORAL RINSE (MEDLINE KIT)
15.0000 mL | Freq: Two times a day (BID) | OROMUCOSAL | Status: DC
  Administered 2016-10-28 – 2016-10-29 (×5): 15 mL via OROMUCOSAL

## 2016-10-27 MED ORDER — NOREPINEPHRINE BITARTRATE 1 MG/ML IV SOLN
0.0000 ug/min | Freq: Once | INTRAVENOUS | Status: AC
  Administered 2016-10-27: 5 ug/min via INTRAVENOUS

## 2016-10-27 MED ORDER — NOREPINEPHRINE BITARTRATE 1 MG/ML IV SOLN
0.0000 ug/min | INTRAVENOUS | Status: DC
  Administered 2016-10-27: 12 ug/min via INTRAVENOUS
  Filled 2016-10-27 (×5): qty 4

## 2016-10-27 MED ORDER — INSULIN ASPART 100 UNIT/ML ~~LOC~~ SOLN
0.0000 [IU] | SUBCUTANEOUS | Status: DC
  Administered 2016-10-28 – 2016-10-30 (×8): 2 [IU] via SUBCUTANEOUS

## 2016-10-27 MED ORDER — DEXTROSE 50 % IV SOLN
INTRAVENOUS | Status: AC
  Administered 2016-10-27: 25 mL
  Filled 2016-10-27: qty 50

## 2016-10-27 MED ORDER — ATROPINE SULFATE 1 MG/ML IJ SOLN
INTRAMUSCULAR | Status: AC | PRN
  Administered 2016-10-27: 1 mg via INTRAVENOUS

## 2016-10-27 MED ORDER — PROPOFOL 1000 MG/100ML IV EMUL
INTRAVENOUS | Status: AC
  Filled 2016-10-27: qty 100

## 2016-10-27 MED ORDER — HEPARIN SODIUM (PORCINE) 5000 UNIT/ML IJ SOLN
5000.0000 [IU] | Freq: Three times a day (TID) | INTRAMUSCULAR | Status: DC
  Administered 2016-10-27 – 2016-11-06 (×30): 5000 [IU] via SUBCUTANEOUS
  Filled 2016-10-27 (×31): qty 1

## 2016-10-27 MED ORDER — PIPERACILLIN-TAZOBACTAM IN DEX 2-0.25 GM/50ML IV SOLN
2.2500 g | Freq: Three times a day (TID) | INTRAVENOUS | Status: DC
  Administered 2016-10-27 – 2016-10-29 (×8): 2.25 g via INTRAVENOUS
  Filled 2016-10-27 (×11): qty 50

## 2016-10-27 MED ORDER — FENTANYL CITRATE (PF) 100 MCG/2ML IJ SOLN
INTRAMUSCULAR | Status: AC
  Filled 2016-10-27: qty 2

## 2016-10-27 MED ORDER — ORAL CARE MOUTH RINSE
15.0000 mL | Freq: Four times a day (QID) | OROMUCOSAL | Status: DC
  Administered 2016-10-27 – 2016-10-29 (×8): 15 mL via OROMUCOSAL

## 2016-10-27 MED ORDER — NOREPINEPHRINE BITARTRATE 1 MG/ML IV SOLN
0.0000 ug/min | INTRAVENOUS | Status: DC
  Administered 2016-10-27: 5 ug/min via INTRAVENOUS
  Administered 2016-10-28: 20 ug/min via INTRAVENOUS
  Administered 2016-10-30: 16 ug/min via INTRAVENOUS
  Administered 2016-10-31: 9 ug/min via INTRAVENOUS
  Filled 2016-10-27 (×3): qty 16

## 2016-10-27 MED ORDER — SODIUM CHLORIDE 0.9 % IV SOLN
INTRAVENOUS | Status: DC | PRN
  Administered 2016-10-27 (×3): 1000 mL via INTRAVENOUS

## 2016-10-27 NOTE — ED Notes (Signed)
Blood cultures collected,  Nurse will draw pending labs from central line.

## 2016-10-27 NOTE — Code Documentation (Signed)
Pt to CT with RN, EMT, RT, zoll, and cardiac monitor

## 2016-10-27 NOTE — ED Notes (Signed)
Attempted report 

## 2016-10-27 NOTE — H&P (Addendum)
NOTE-> THIS IS A MERGED CHART   Name: Omar Lambert MRN: [id number] DOB: 10-13-1940    ADMISSION DATE:  10/27/2016 CONSULTATION DATE: 10/27/16  REFERRING MD : ER ATTENDING DELO  CHIEF COMPLAINT:  BRADYCARDIA, UNRESPONSIVE  BRIEF PATIENT DESCRIPTION: 76 YR OLD MALE W/ PMHX CHF, CKD STAGE III/IV ( seen by Nephro this week and discussing possible dialysis), Afib RVR, Aflutter, DM presents after opthalmic surgery on 9/21 AM. Wife found him unresponsive at home at midnight. Bradycardic and unresponsive never lost pulse. Intubated, given atropine, started on Epinephrine, BP 70J systolic highest Hr in 50K. PCCM consulted   SIGNIFICANT EVENTS  ENDOTRACHEAL INTUBATION RIGHT IJ CVC A-LINE   STUDIES:  CT HEAD CXR   HISTORY OF PRESENT ILLNESS:  76 yr old male with h/o CHF AND CKD STAGE 3per wife follows with Cardiology  Presents after eye surgery earlier today. Pt was awake and alert after procedure per wife. No appetite and increasingly somnolent. Per wife spoke to her at 8 pm but was increasingly comnolent and went to bed until midnight. She woke up and noticed he had secretions coming from his mouth and he was not responding. She called EMS. Bradycardic and unresponsive. Received Atropine started on epi ggt   PAST MEDICAL HISTORY :   has a past medical history of CHF (congestive heart failure) ([place]); Diabetes mellitus without complication ([place]); and Thyroid disease.  has no past surgical history on file. Prior to Admission medications   Medication Sig Start Date End Date Taking? Authorizing Provider  acetaminophen (TYLENOL) 325 MG tablet Take 650 mg by mouth every 6 (six) hours as needed for mild pain.   Yes [provider]  allopurinol (ZYLOPRIM) 300 MG tablet Take 300 mg by mouth daily.   Yes [provider]  amiodarone (PACERONE) 200 MG tablet Take 200 mg by mouth 2 (two) times daily.   Yes [provider]  apixaban (ELIQUIS) 5 MG TABS tablet Take 5 mg by mouth  daily.    Yes [provider]  fluticasone (FLONASE) 50 MCG/ACT nasal spray Place 1 spray into both nostrils 2 (two) times daily.   Yes [provider]  levothyroxine (SYNTHROID, LEVOTHROID) 175 MCG tablet Take 175 mcg by mouth daily before breakfast.   Yes [provider]  linagliptin (TRADJENTA) 5 MG TABS tablet Take 5 mg by mouth daily.   Yes [provider]  pantoprazole (PROTONIX) 40 MG tablet Take 40 mg by mouth daily.   Yes [provider]  pravastatin (PRAVACHOL) 20 MG tablet Take 20 mg by mouth daily.   Yes [provider]  torsemide (DEMADEX) 20 MG tablet Take 20 mg by mouth See admin instructions. Take 2 tablets every morning and take 1 tablet at noon   Yes [provider]   No Known Allergies  FAMILY HISTORY:  family history is not on file. SOCIAL HISTORY:    REVIEW OF SYSTEMS:  UNABLE TO OBTAIN DUE TO MEDICAL CONDITION Constitutional: Negative for fever, chills, weight loss, malaise/fatigue and diaphoresis.  HENT: Negative for hearing loss, ear pain, nosebleeds, congestion, sore throat, neck pain, tinnitus and ear discharge.   Eyes: Negative for blurred vision, double vision, photophobia, pain, discharge and redness.  Respiratory: Negative for cough, hemoptysis, sputum production, shortness of breath, wheezing and stridor.   Cardiovascular: Negative for chest pain, palpitations, orthopnea, claudication, leg swelling and PND.  Gastrointestinal: Negative for heartburn, nausea, vomiting, abdominal pain, diarrhea, constipation, blood in stool and melena.  Genitourinary: Negative for dysuria, urgency,  frequency, hematuria and flank pain.  Musculoskeletal: Negative for myalgias, back pain, joint pain and falls.  Skin: Negative for itching and rash.  Neurological: Negative for dizziness, tingling, tremors, sensory change, speech change, focal weakness, seizures, loss of consciousness, weakness and headaches.    Endo/Heme/Allergies: Negative for environmental allergies and polydipsia. Does not bruise/bleed easily.  SUBJECTIVE:   VITAL SIGNS: Temp:  [97.3 F (36.3 C)] 97.3 F (36.3 C) (09/22 0023) Pulse Rate:  [36-73] 61 (09/22 0500) Resp:  [18-24] 24 (09/22 0500) BP: (67-147)/(41-76) 131/70 (09/22 0545) SpO2:  [90 %-100 %] 97 % (09/22 0500) Arterial Line BP: (152-185)/(68-85) 152/81 (09/22 0445) FiO2 (%):  [100 %] 100 % (09/22 0308) Weight:  [100 kg (220 lb 7.4 oz)] 100 kg (220 lb 7.4 oz) (09/22 0023)  PHYSICAL EXAMINATION: General:  Well nourished male Neuro:  Localizes to pain, moves all extremities HEENT: patch over right eye. Intubated ETT in position Cardiovascular:  Bradycardic, S1 and S2 appreciated no rubs Lungs:  + rales Abdomen:  Soft but distended + BS hypoactive Musculoskeletal:  No signs of atrophy Skin: intact diaphoretic   Recent Labs Lab 10/27/16 0025 10/27/16 0400  NA 142  --   K 4.9  --   CL 100*  --   CO2 22  --   BUN 44*  --   CREATININE 3.96* 4.23*  GLUCOSE 100*  --     Recent Labs Lab 10/27/16 0025 10/27/16 0400  HGB 11.7* 12.0*  HCT 40.9 41.0  WBC 11.2* 18.3*  PLT 145* 139*   Ct Head Wo Contrast  Result Date: 10/27/2016 CLINICAL DATA:  Altered level of consciousness. EXAM: CT HEAD WITHOUT CONTRAST TECHNIQUE: Contiguous axial images were obtained from the base of the skull through the vertex without intravenous contrast. COMPARISON:  None. FINDINGS: BRAIN: No intraparenchymal hemorrhage, mass effect nor midline shift. The ventricles and sulci are normal for age. No acute large vascular territory infarcts. No abnormal extra-axial fluid collections. Basal cisterns are patent. VASCULAR: Mild calcific atherosclerosis of the carotid siphons. SKULL: No skull fracture. No significant scalp soft tissue swelling. SINUSES/ORBITS: The mastoid air-cells and included paranasal sinuses are well-aerated.The included ocular globes and orbital contents are  non-suspicious. Status post RIGHT ocular lens implant. Prominent bilateral superior ophthalmic veins seen with bowel solid. OTHER: LEFT nasogastric tube. IMPRESSION: Negative noncontrast CT HEAD for age. Nasogastric tube terminates in pharynx.  Recommend advancement. Electronically Signed   By: Elon Alas M.D.   On: 10/27/2016 01:14   Dg Chest Portable 1 View  Result Date: 10/27/2016 CLINICAL DATA:  Central line placement EXAM: PORTABLE CHEST 1 VIEW COMPARISON:  10/27/2016 FINDINGS: Endotracheal tube tip measures 3.8 cm above the carina. Enteric tube tip is off the field of view but below the left hemidiaphragm. A right central venous catheter has been placed with tip over the mid SVC region. No pneumothorax. Cardiac enlargement. No vascular congestion. Small bilateral pleural effusions, greater on the right. Atelectasis or infiltration in both bases, also greater on the right. Calcification of the aorta. IMPRESSION: Appliances appear in satisfactory present shin. Persistent bilateral pleural effusions with infiltration or atelectasis in the lung bases, greater on the right. Electronically Signed   By: Lucienne Capers M.D.   On: 10/27/2016 03:02   Dg Chest Port 1 View  Result Date: 10/27/2016 CLINICAL DATA:  Post intubation. EXAM: PORTABLE CHEST 1 VIEW COMPARISON:  None. FINDINGS: Endotracheal tube tip projects 3.1 cm above the carina. Nasogastric tube past proximal stomach, distal tip not imaged. Cardiac  silhouette is mild-to-moderately enlarged. Calcified aortic knob. Pulmonary vascular congestion. Layering small to moderate RIGHT pleural effusion with interstitial to lesser extent faint alveolar airspace opacities. No pneumothorax. Soft tissue planes included osseous structures are nonsuspicious. IMPRESSION: Endotracheal tube tip projects 3.1 cm above the carina. Nasogastric tube past proximal stomach. Cardiomegaly, interstitial and to lesser extent alveolar edema with small to moderate layering  RIGHT pleural effusion. Aortic Atherosclerosis (ICD10-I70.0). Electronically Signed   By: Elon Alas M.D.   On: 10/27/2016 01:18   Dg Abd Portable 1v  Result Date: 10/27/2016 CLINICAL DATA:  Verify orogastric tube placement. EXAM: PORTABLE ABDOMEN - 1 VIEW COMPARISON:  None. FINDINGS: The bowel gas pattern is normal. No radio-opaque calculi or other significant radiographic abnormality are seen. Nasogastric tube tip projects in distal stomach. IMPRESSION: Nasogastric tube tip projects in distal stomach. Nonspecific bowel gas pattern. Electronically Signed   By: Elon Alas M.D.   On: 10/27/2016 01:24   EKG - atrial fibrillation with slow ventricular response with RBBB low QRS voltage  ECHO 08/18/2015 - Left ventricle: The cavity size was normal. There was moderate concentric hypertrophy. Systolic function was normal. The estimated ejection fraction was in the range of 60% to 65%. Wall motion was normal; there were no regional wall motion abnormalities. - Left atrium: The atrium was moderately dilated. - Right ventricle: The cavity size was mildly dilated. Wall thickness was increased. - Right atrium: The atrium was moderately dilated. - Tricuspid valve: There was moderate regurgitation. - Pulmonary arteries: Systolic pressure was mildly increased. PA peak pressure: 40 mm Hg (S).  Stress test nuclear lexiscan 10/23/2016  There was no ST segment deviation noted during stress.  The study is normal.  This is a low risk study.  The left ventricular ejection fraction is mildly decreased (45-54%).  Nuclear stress EF: 47%.  Low risk stress nuclear study with normal perfusion and mildly reduced left ventricularsystolic function. Prominent paradoxical septal motion, consider right heart overload. Suggest corroboration with echo  ASSESSMENT / PLAN:  NEURO: GCS 8  Sedation: Goal RASS 0 to -1 Started on propofol ggt  Still requiring prn versed  pushes Analgesia: fentanyl ggt No seizures  CTH negative for any acute pathology  CARDIAC: Shock Cardiogenic vs Sepsis Hold anti-hypertensives AV nodal blockade and amiodarone MAP goal >104mmHg BP HR dept.  Pt has a h/o CHF, Afib RVR, Aflutter Could bradycardia be secondary to betablockade received during opthalmic procedure ( eg. 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 They agreed with glucagon challenge   PULMONARY: Intubated on PRVC Mechanical ventilation CXR shows cardiogenic pulmonary edema Arterial Line was placed for frequent ABGS ABG: 1.61/09/60/45 Metabolic acidosis  Needs diuresis prior to weaning from ventilator  ID: Blood cx x 2 sent Afebrile but leukocytosis with left shift Pt had h/o cough may be cardiac in nature but covering with broad spectrum antibiotics renally dosed  Endocrine: Check TSH H/o insulin dependence Started on ICU Glycemic protocol  Goal BG 140-180mg /dl With glucagon trial anticipate blood sugars may be labile   GI: NPO OGT placed and in position If pt hemodynamically stable will evaluate for TF GI PPx for  Protonix on board  Heme: hgb 12 Platelets 139 No active sources of bleeding  If Hgb<8  transfuse PRBCs .Marland KitchenA POS No h/o coagulopathy DVT PPx-> heparin [place] and SCDs  RENAL CKD Stage 3 (G3bA2) at previously documented baseline Per wife kidney function had worsened and this week had an appt with Neurology discussing HD AKI on CKD- now CKD stage 4 Renally dose medications Avoid nephrotoxic meds Get Vancomycin random  level Replace electrolytes as needed. Kt goal>4 Calcium>9 Mg >2 Consult placed to Nephrology .Marland Kitchen Lab Results  Component Value Date   CREATININE 4.23 (H) 10/27/2016   CREATININE 3.96 (H) 54/00/8676   Metabolic Acidosis  Lactate elevated at 11.4 previously 9.63   I, Dr Seward Carol have personally reviewed patient's available data, including medical history, events of note, physical examination and test results as part of my evaluation. I have discussed with r other care providers such as consultants, pharmacist, RN and RRT. The patient is critically ill with multiple organ systems failure and requires high complexity decision making for assessment and support, frequent evaluation and titration of therapies, application of advanced monitoring technologies and extensive interpretation of multiple databases.   Critical Care Time devoted to patient care services described in this note is 110 Minutes. This time reflects time of care of this signee Dr Seward Carol. This critical care time does not reflect procedure time, or teaching time or supervisory time but could involve care discussion time    Dr. Seward Carol [facility]  10/27/2016 6:14 AM   DISPOSITION:ICU PROGNOSIS: Guarded CODE STATUS: Full FAMILY: Wife is Next of Kin ( I updated her of all recent findings)  Signed Dr Seward Carol [facility] [place] 10/27/2016, 5:46 AM

## 2016-10-27 NOTE — ED Notes (Signed)
Pt moving arms and chewing on tube. Verbal order obtained for 4mg  versed from Dr.Skatliffe

## 2016-10-27 NOTE — Progress Notes (Signed)
[facility] Progress Note Patient Name: Omar Lambert DOB: 05-13-1940 MRN: [id number]   Date of Service  10/27/2016  HPI/Events of Note  New patient evaluation  eICU Interventions  Nothing further to add     Intervention Category Major Interventions: Other:  Breta Demedeiros 10/27/2016, 3:19 PM

## 2016-10-27 NOTE — Code Documentation (Signed)
CCM at bedside speaking with family; central line set up at bedside per MD

## 2016-10-27 NOTE — ED Provider Notes (Signed)
[facility] DEPT Provider Note   CSN: [id number] Arrival date & time: 10/27/16  0008     History   Chief Complaint Chief Complaint  Patient presents with  . Unresponsive    HPI Omar Lambert is a 76 y.o. male.  Patient is a 76 year old male with testicle history of CHF, diabetes, and thyroid disease. He is brought by EMS for evaluation of unresponsiveness. He underwent a procedure on his right eyelid as an outpatient earlier this afternoon. He seemed to be doing well until several hours afterward. The wife reports he became more somnolent, less responsive, and refused to eat or drink. As the evening went on, he became less and less responsive to the point that she could not arouse him. At this point, she called 911. The patient was evaluated by EMS and was found to be hypotensive, bradycardic, and not protecting his airway. A nasal trumpet was placed, a transcutaneous pacer was applied, and ventilation was performed with bag-valve-mask. He was then transported here for evaluation in critical condition. The patient adds no history secondary to acuity of condition.   The history is provided by a relative and the EMS personnel.    Past Medical History:  Diagnosis Date  . CHF (congestive heart failure) ([place])   . Diabetes mellitus without complication ([place])   . Thyroid disease     There are no active problems to display for this patient.   No past surgical history on file.     Home Medications    Prior to Admission medications   Not on File    Family History No family history on file.  Social History Social History  Substance Use Topics  . Smoking status: Not on file  . Smokeless tobacco: Not on file  . Alcohol use Not on file     Allergies   Patient has no known allergies.   Review of Systems Review of Systems  Unable to perform ROS: Acuity of condition     Physical Exam Updated Vital Signs BP (!) 76/44   Pulse (!) 56   Temp (!) 97.3 F (36.3 C)  (Axillary)   Resp 18   Ht 5\' 8"  (1.727 m)   Wt 100 kg (220 lb 7.4 oz)   SpO2 94%   BMI 33.52 kg/m   Physical Exam  Constitutional: He appears well-developed and well-nourished.  Patient is an elderly male. He appears acutely ill and is unresponsive.  HENT:  Head: Normocephalic and atraumatic.  Mouth/Throat: Oropharynx is clear and moist.  Eyes:  The right eye has a dressing in place. Left pupil is 3 mm and sluggishly reactive.  Neck: Normal range of motion. Neck supple.  Cardiovascular:  Heart sounds are distant and there is market bradycardia.  Pulmonary/Chest:  Patient is making minimal respiratory effort. A nasal trumpet is in place and respirations are being performed with bag-valve-mask. Breath sounds are audible bilaterally, but are coarse to auscultation.  Abdominal: Soft. Bowel sounds are normal. He exhibits no distension. There is no tenderness.  Musculoskeletal: Normal range of motion. He exhibits no edema.  Neurological:  GCS is 3 upon arrival.  Skin: Skin is warm and dry.  Nursing note and vitals reviewed.    ED Treatments / Results  Labs (all labs ordered are listed, but only abnormal results are displayed) Labs Reviewed  COMPREHENSIVE METABOLIC PANEL - Abnormal; Notable for the following:       Result Value   Chloride 100 (*)    Glucose, Bld 100 (*)  BUN 44 (*)    Creatinine, Ser 3.96 (*)    Albumin 3.4 (*)    AST 361 (*)    ALT 290 (*)    Alkaline Phosphatase 169 (*)    Total Bilirubin 1.9 (*)    GFR calc non Af Amer 13 (*)    GFR calc Af Amer 15 (*)    Anion gap 20 (*)    All other components within normal limits  ACETAMINOPHEN LEVEL - Abnormal; Notable for the following:    Acetaminophen (Tylenol), Serum <10 (*)    All other components within normal limits  CBC WITH DIFFERENTIAL/PLATELET - Abnormal; Notable for the following:    WBC 11.2 (*)    RBC 3.75 (*)    Hemoglobin 11.7 (*)    MCV 109.1 (*)    MCHC 28.6 (*)    RDW 16.8 (*)     Platelets 145 (*)    Neutro Abs 9.0 (*)    Lymphs Abs 0.6 (*)    Monocytes Absolute 1.6 (*)    All other components within normal limits  PROTIME-INR - Abnormal; Notable for the following:    Prothrombin Time 22.8 (*)    All other components within normal limits  URINALYSIS, ROUTINE W REFLEX MICROSCOPIC - Abnormal; Notable for the following:    Protein, ur 100 (*)    Bacteria, UA RARE (*)    All other components within normal limits  I-STAT TROPONIN, ED - Abnormal; Notable for the following:    Troponin i, poc 0.15 (*)    All other components within normal limits  I-STAT CG4 LACTIC ACID, ED - Abnormal; Notable for the following:    Lactic Acid, Venous 9.63 (*)    All other components within normal limits  ETHANOL  SALICYLATE LEVEL  I-STAT CHEM 8, ED  TYPE AND SCREEN  ABO/RH    EKG  EKG Interpretation None       Radiology Ct Head Wo Contrast  Result Date: 10/27/2016 CLINICAL DATA:  Altered level of consciousness. EXAM: CT HEAD WITHOUT CONTRAST TECHNIQUE: Contiguous axial images were obtained from the base of the skull through the vertex without intravenous contrast. COMPARISON:  None. FINDINGS: BRAIN: No intraparenchymal hemorrhage, mass effect nor midline shift. The ventricles and sulci are normal for age. No acute large vascular territory infarcts. No abnormal extra-axial fluid collections. Basal cisterns are patent. VASCULAR: Mild calcific atherosclerosis of the carotid siphons. SKULL: No skull fracture. No significant scalp soft tissue swelling. SINUSES/ORBITS: The mastoid air-cells and included paranasal sinuses are well-aerated.The included ocular globes and orbital contents are non-suspicious. Status post RIGHT ocular lens implant. Prominent bilateral superior ophthalmic veins seen with bowel solid. OTHER: LEFT nasogastric tube. IMPRESSION: Negative noncontrast CT HEAD for age. Nasogastric tube terminates in pharynx.  Recommend advancement. Electronically Signed   By:  Elon Alas M.D.   On: 10/27/2016 01:14   Dg Chest Port 1 View  Result Date: 10/27/2016 CLINICAL DATA:  Post intubation. EXAM: PORTABLE CHEST 1 VIEW COMPARISON:  None. FINDINGS: Endotracheal tube tip projects 3.1 cm above the carina. Nasogastric tube past proximal stomach, distal tip not imaged. Cardiac silhouette is mild-to-moderately enlarged. Calcified aortic knob. Pulmonary vascular congestion. Layering small to moderate RIGHT pleural effusion with interstitial to lesser extent faint alveolar airspace opacities. No pneumothorax. Soft tissue planes included osseous structures are nonsuspicious. IMPRESSION: Endotracheal tube tip projects 3.1 cm above the carina. Nasogastric tube past proximal stomach. Cardiomegaly, interstitial and to lesser extent alveolar edema with small to moderate layering RIGHT  pleural effusion. Aortic Atherosclerosis (ICD10-I70.0). Electronically Signed   By: Elon Alas M.D.   On: 10/27/2016 01:18   Dg Abd Portable 1v  Result Date: 10/27/2016 CLINICAL DATA:  Verify orogastric tube placement. EXAM: PORTABLE ABDOMEN - 1 VIEW COMPARISON:  None. FINDINGS: The bowel gas pattern is normal. No radio-opaque calculi or other significant radiographic abnormality are seen. Nasogastric tube tip projects in distal stomach. IMPRESSION: Nasogastric tube tip projects in distal stomach. Nonspecific bowel gas pattern. Electronically Signed   By: Elon Alas M.D.   On: 10/27/2016 01:24    Procedures Procedures (including critical care time)  Medications Ordered in ED Medications  propofol (DIPRIVAN) 1000 MG/100ML infusion (not administered)  EPINEPHrine (ADRENALIN) 4 mg in dextrose 5 % 250 mL (0.016 mg/mL) infusion (10 mcg/min Intravenous Rate/Dose Change 10/27/16 0036)  0.9 %  sodium chloride infusion (1,000 mLs Intravenous New Bag/Given 10/27/16 0101)  atropine injection (1 mg Intravenous Given 10/27/16 0012)  calcium chloride injection (1 g Intravenous Given 10/27/16  0013)  fentaNYL (SUBLIMAZE) 100 MCG/2ML injection (100 mcg  Given 10/27/16 0054)     Initial Impression / Assessment and Plan / ED Course  I have reviewed the triage vital signs and the nursing notes.  Pertinent labs & imaging results that were available during my care of the patient were reviewed by me and considered in my medical decision making (see chart for details).  Patient arrives here critically ill. He is unresponsive and has only a minimal gag reflex. Intubation was performed without the need for RSI. A bougie was used and a 7.5 endotracheal tube was placed. Placement was confirmed with direct visualization, auscultation over the chest and lungs, and end-tidal CO2.  His heart rate was initially in the 30s, but did have palpable femoral and carotid pulses. His blood pressure was found to be low. He was given atropine which improved his heart rate along with calcium in case his bradycardia and hypotension related to hyperkalemia. IV fluids were initiated as was an epinephrine drip. This resulted in an increase in his heart rate, blood pressure, and allowed for the termination of the transcutaneous pacemaker.  Head CT was obtained which was negative. Remainder of the laboratory studies reveal an elevated lactate, renal failure, evidence for shock liver.  PCCM was consult to and at this point has assumed care. Patient will be admitted to the critical care service for further management.  I personally informed the patient's wife of his condition.  CRITICAL CARE Performed by: Veryl Speak Total critical care time: 60 minutes Critical care time was exclusive of separately billable procedures and treating other patients. Critical care was necessary to treat or prevent imminent or life-threatening deterioration. Critical care was time spent personally by me on the following activities: development of treatment plan with patient and/or surrogate as well as nursing, discussions with  consultants, evaluation of patient's response to treatment, examination of patient, obtaining history from patient or surrogate, ordering and performing treatments and interventions, ordering and review of laboratory studies, ordering and review of radiographic studies, pulse oximetry and re-evaluation of patient's condition.   Final Clinical Impressions(s) / ED Diagnoses   Final diagnoses:  Encounter for orogastric (OG) tube placement    New Prescriptions New Prescriptions   No medications on file     Veryl Speak, MD 10/27/16 [phone]

## 2016-10-27 NOTE — ED Notes (Signed)
Nurse will draw labs. 

## 2016-10-27 NOTE — Procedures (Signed)
Arterial Catheter Insertion Procedure Note Omar Lambert [id number] [birth year]  Procedure: Insertion of Arterial Catheter  Indications: Blood pressure monitoring  Procedure Details Consent: Unable to obtain consent because of altered level of consciousness. Time Out: Verified patient identification, verified procedure, site/side was marked, verified correct patient position, special equipment/implants available, medications/allergies/relevent history reviewed, required imaging and test results available.  Performed  Maximum sterile technique was used including antiseptics, cap, gloves, gown, hand hygiene, mask and sheet. Skin prep: Chlorhexidine; local anesthetic administered 20 gauge catheter was inserted into right radial artery using the Seldinger technique.  Evaluation Blood flow good; BP tracing good. Complications: No apparent complications.   Omar Lambert 10/27/2016

## 2016-10-27 NOTE — Progress Notes (Signed)
eLink Physician-Brief Progress Note Patient Name: PAXSON HARROWER DOB: [birth year] MRN: [id number]   Date of Service  10/27/2016  HPI/Events of Note  Agitation   eICU Interventions  Will increase ceiling on Fentanyl IV infusion to 300 mcg/hour.      Intervention Category Minor Interventions: Agitation / anxiety - evaluation and management  Sommer,Steven Eugene 10/27/2016, 11:40 PM

## 2016-10-27 NOTE — Progress Notes (Signed)
Elink MD notified of possible seizure like activity. Pt shaking intermittently and not following commands. MD on camera. PRN versed given. No further orders at this time. Will continue to monitor closely. Eleonore Chiquito RN 2 Heart

## 2016-10-27 NOTE — ED Notes (Signed)
Cardiology at bedside.

## 2016-10-27 NOTE — Progress Notes (Signed)
PHARMACY NOTE:  ANTIMICROBIAL RENAL DOSAGE ADJUSTMENT  Current antimicrobial regimen includes a mismatch between antimicrobial dosage and estimated renal function.  As per policy approved by the Pharmacy & Therapeutics and Medical Executive Committees, the antimicrobial dosage will be adjusted accordingly.  Current antimicrobial dosage:  Zosyn 3.375g IV Q8H (4-hour infusion)  Indication: sepsis  Renal Function:  Estimated Creatinine Clearance: 17.9 mL/min (A) (by C-G formula based on SCr of 3.96 mg/dL (H)). []      On intermittent HD, scheduled: []      On CRRT    Antimicrobial dosage has been changed to:  Zosyn 2.25g IV Q8H  Additional comments: Pharmacy would be happy to dose antibiotics for this patient if appropriate.   Thank you for allowing pharmacy to be a part of this patient's care.  Wynona Neat, PharmD, BCPS  10/27/2016 3:26 AM

## 2016-10-27 NOTE — Progress Notes (Signed)
  Echocardiogram 2D Echocardiogram has been performed.  Kemya Shed 10/27/2016, 3:56 PM

## 2016-10-27 NOTE — ED Notes (Signed)
Per critical care, to not use arterial blood line BP as guideline for levophed. To use Bp cuff machine

## 2016-10-27 NOTE — Consult Note (Signed)
CARDIOLOGY INPATIENT CONSULTATION NOTE  Patient ID: Omar Lambert MRN: [id number], DOB/AGE: 07-18-1940   Admit date: 10/27/2016   Primary Physician: System, Pcp Not In Primary Cardiologist: Skeet Latch MD [facility])  Reason for admission: bradycardia  HPI: This is a 76 y.o. male with significant history of severe COPD, DM II, CKD 4, HTN, OSA on CPAP, hypothyroidism and atrial flutter on eliquis and amidoarone presented unresponsive.    Patient had an eye procedure today and went home where he gradually became lethargic and around 10 pm was found to be unresponsive by his wife. He has not urinated much today either after the procedure. Wife is . Not aware of the medications given during the procedure but he was sedated and laid down for most of the day after the procedure. Wife called 911. When EMS arrived, patient was found to have bradycardiac and was unresponsive. EMS started on transcutaneous pacing with improvement in HR. On arrival to ER, he was fatigued and responding however needed to be intubated to protect his airway. He was started on epi drip. Initial lab work showed lactic acidosis, hypoglycemia, azotemia with Cr of ~4.  Patient recently underwent myocardial perfusion scan on 9/18 showing no reversible ischemia, low risk stress test with 47% post stress LVEF.   EKG - atrial fibrillation with slow ventricular response with RBBB low QRS voltage  ECHO 08/18/2015 - Left ventricle: The cavity size was normal. There was moderate   concentric hypertrophy. Systolic function was normal. The   estimated ejection fraction was in the range of 60% to 65%. Wall   motion was normal; there were no regional wall motion   abnormalities. - Left atrium: The atrium was moderately dilated. - Right ventricle: The cavity size was mildly dilated. Wall   thickness was increased. - Right atrium: The atrium was moderately dilated. - Tricuspid valve: There was moderate regurgitation. -  Pulmonary arteries: Systolic pressure was mildly increased. PA   peak pressure: 40 mm Hg (S).  Stress test nuclear lexiscan 10/23/2016  There was no ST segment deviation noted during stress.  The study is normal.  This is a low risk study.  The left ventricular ejection fraction is mildly decreased (45-54%).  Nuclear stress EF: 47%.   Low risk stress nuclear study with normal perfusion and mildly reduced left ventricularsystolic function. Prominent paradoxical septal motion, consider right heart overload. Suggest corroboration with echo.  Problem List: Past Medical History:  Diagnosis Date  . CHF (congestive heart failure) ([place])   . Diabetes mellitus without complication ([place])   . Thyroid disease     No past surgical history on file.   Allergies: No Known Allergies   Home Medications Current Facility-Administered Medications  Medication Dose Route Frequency Provider Last Rate Last Dose  . 0.9 %  sodium chloride infusion   Intravenous Continuous PRN Veryl Speak, MD 1,000 mL/hr at 10/27/16 0101 1,000 mL at 10/27/16 0101  . EPINEPHrine (ADRENALIN) 4 mg in dextrose 5 % 250 mL (0.016 mg/mL) infusion  0.5-20 mcg/min Intravenous Titrated Veryl Speak, MD 37.5 mL/hr at 10/27/16 0036 10 mcg/min at 10/27/16 0036  . fentaNYL (SUBLIMAZE) injection   Intravenous PRN Scatliffe, Rise Paganini, MD   50 mcg at 10/27/16 0140  . midazolam (VERSED) 5 MG/5ML injection   Intravenous PRN Scatliffe, Rise Paganini, MD   4 mg at 10/27/16 0152  . propofol (DIPRIVAN) 1000 MG/100ML infusion            No current outpatient prescriptions on file.  Family History:  The patient's family history includes COPD in his sister and sister; Cancer in his sister; Diabetes in his sister; Stroke in his mother.    Social History   Social History  . Marital status: Married    Spouse name: N/A  . Number of children: N/A  . Years of education: N/A   Occupational History  . Not on file.   Social History Main  Topics  . Smoking status: Not on file  . Smokeless tobacco: Not on file  . Alcohol use Not on file  . Drug use: Unknown  . Sexual activity: Not on file   Other Topics Concern  . Not on file   Social History Narrative  . No narrative on file     Review of Systems: limited since patient is  Sedated intubated  General: no fevers, chills, +ve fatigue Cardiovascular: no chest pain, has been having dyspnea for a few days.  Dermatological: negative for rash Respiratory: negative for cough or wheezing Urologic: decreased urination  Abdominal: negative for nausea, vomiting, diarrhea, bright red blood per rectum, melena, or hematemesis Neurologic: eye surgery today, right eye covered with dressing Endocrine: hypothyroidism Immunological: no lymph adenopathy Psych: non homicidal/suicidal  Physical Exam: Vitals: BP (!) 76/44   Pulse (!) 56   Temp (!) 97.3 F (36.3 C) (Axillary)   Resp 18   Ht 5\' 8"  (1.727 m)   Wt 100 kg (220 lb 7.4 oz)   SpO2 94%   BMI 33.52 kg/m  General: not in acute distress Neck: central line in the right IJ Heart: irregular rhythm, bradycardiac S1, S2, no murmurs  Lungs: crackles bilaterally, intubated, frothy secretions in the ETT GI: distended Extremities: 1+ edema Neuro: AAO x 0, sedated/intubated Psych: sedated/intubated   Labs:   Results for orders placed or performed during the hospital encounter of 10/27/16 (from the past 24 hour(s))  Comprehensive metabolic panel     Status: Abnormal   Collection Time: 10/27/16 12:25 AM  Result Value Ref Range   Sodium 142 135 - 145 mmol/L   Potassium 4.9 3.5 - 5.1 mmol/L   Chloride 100 (L) 101 - 111 mmol/L   CO2 22 22 - 32 mmol/L   Glucose, Bld 100 (H) 65 - 99 mg/dL   BUN 44 (H) 6 - 20 mg/dL   Creatinine, Ser 3.96 (H) 0.61 - 1.24 mg/dL   Calcium 9.7 8.9 - 10.3 mg/dL   Total Protein 6.9 6.5 - 8.1 g/dL   Albumin 3.4 (L) 3.5 - 5.0 g/dL   AST 361 (H) 15 - 41 U/L   ALT 290 (H) 17 - 63 U/L   Alkaline  Phosphatase 169 (H) 38 - 126 U/L   Total Bilirubin 1.9 (H) 0.3 - 1.2 mg/dL   GFR calc non Af Amer 13 (L) >60 mL/min   GFR calc Af Amer 15 (L) >60 mL/min   Anion gap 20 (H) 5 - 15  Ethanol     Status: None   Collection Time: 10/27/16 12:25 AM  Result Value Ref Range   Alcohol, Ethyl (B) <5 <5 mg/dL  Acetaminophen level     Status: Abnormal   Collection Time: 10/27/16 12:25 AM  Result Value Ref Range   Acetaminophen (Tylenol), Serum <10 (L) 10 - 30 ug/mL  Salicylate level     Status: None   Collection Time: 10/27/16 12:25 AM  Result Value Ref Range   Salicylate Lvl <1.4 2.8 - 30.0 mg/dL  CBC with Differential  Status: Abnormal   Collection Time: 10/27/16 12:25 AM  Result Value Ref Range   WBC 11.2 (H) 4.0 - 10.5 K/uL   RBC 3.75 (L) 4.22 - 5.81 MIL/uL   Hemoglobin 11.7 (L) 13.0 - 17.0 g/dL   HCT 40.9 39.0 - 52.0 %   MCV 109.1 (H) 78.0 - 100.0 fL   MCH 31.2 26.0 - 34.0 pg   MCHC 28.6 (L) 30.0 - 36.0 g/dL   RDW 16.8 (H) 11.5 - 15.5 %   Platelets 145 (L) 150 - 400 K/uL   Neutrophils Relative % 81 %   Neutro Abs 9.0 (H) 1.7 - 7.7 K/uL   Lymphocytes Relative 5 %   Lymphs Abs 0.6 (L) 0.7 - 4.0 K/uL   Monocytes Relative 14 %   Monocytes Absolute 1.6 (H) 0.1 - 1.0 K/uL   Eosinophils Relative 0 %   Eosinophils Absolute 0.0 0.0 - 0.7 K/uL   Basophils Relative 0 %   Basophils Absolute 0.0 0.0 - 0.1 K/uL  Protime-INR     Status: Abnormal   Collection Time: 10/27/16 12:25 AM  Result Value Ref Range   Prothrombin Time 22.8 (H) 11.4 - 15.2 seconds   INR 2.03   Type and screen [place]     Status: None   Collection Time: 10/27/16 12:25 AM  Result Value Ref Range   ABO/RH(D) A POS    Antibody Screen NEG    Sample Expiration 10/30/2016   ABO/Rh     Status: None (Preliminary result)   Collection Time: 10/27/16 12:25 AM  Result Value Ref Range   ABO/RH(D) A POS   I-stat troponin, ED     Status: Abnormal   Collection Time: 10/27/16 12:38 AM  Result Value Ref  Range   Troponin i, poc 0.15 (HH) 0.00 - 0.08 ng/mL   Comment NOTIFIED PHYSICIAN    Comment 3          I-Stat CG4 Lactic Acid, ED     Status: Abnormal   Collection Time: 10/27/16 12:40 AM  Result Value Ref Range   Lactic Acid, Venous 9.63 (HH) 0.5 - 1.9 mmol/L   Comment NOTIFIED PHYSICIAN   Urinalysis, Routine w reflex microscopic     Status: Abnormal   Collection Time: 10/27/16  1:13 AM  Result Value Ref Range   Color, Urine YELLOW YELLOW   APPearance CLEAR CLEAR   Specific Gravity, Urine 1.013 1.005 - 1.030   pH 6.0 5.0 - 8.0   Glucose, UA NEGATIVE NEGATIVE mg/dL   Hgb urine dipstick NEGATIVE NEGATIVE   Bilirubin Urine NEGATIVE NEGATIVE   Ketones, ur NEGATIVE NEGATIVE mg/dL   Protein, ur 100 (A) NEGATIVE mg/dL   Nitrite NEGATIVE NEGATIVE   Leukocytes, UA NEGATIVE NEGATIVE   RBC / HPF 0-5 0 - 5 RBC/hpf   WBC, UA 0-5 0 - 5 WBC/hpf   Bacteria, UA RARE (A) NONE SEEN   Squamous Epithelial / LPF NONE SEEN NONE SEEN   Hyaline Casts, UA PRESENT      Radiology/Studies: Ct Head Wo Contrast  Result Date: 10/27/2016 CLINICAL DATA:  Altered level of consciousness. EXAM: CT HEAD WITHOUT CONTRAST TECHNIQUE: Contiguous axial images were obtained from the base of the skull through the vertex without intravenous contrast. COMPARISON:  None. FINDINGS: BRAIN: No intraparenchymal hemorrhage, mass effect nor midline shift. The ventricles and sulci are normal for age. No acute large vascular territory infarcts. No abnormal extra-axial fluid collections. Basal cisterns are patent. VASCULAR: Mild calcific atherosclerosis of the carotid  siphons. SKULL: No skull fracture. No significant scalp soft tissue swelling. SINUSES/ORBITS: The mastoid air-cells and included paranasal sinuses are well-aerated.The included ocular globes and orbital contents are non-suspicious. Status post RIGHT ocular lens implant. Prominent bilateral superior ophthalmic veins seen with bowel solid. OTHER: LEFT nasogastric tube.  IMPRESSION: Negative noncontrast CT HEAD for age. Nasogastric tube terminates in pharynx.  Recommend advancement. Electronically Signed   By: Elon Alas M.D.   On: 10/27/2016 01:14   Dg Chest Port 1 View  Result Date: 10/27/2016 CLINICAL DATA:  Post intubation. EXAM: PORTABLE CHEST 1 VIEW COMPARISON:  None. FINDINGS: Endotracheal tube tip projects 3.1 cm above the carina. Nasogastric tube past proximal stomach, distal tip not imaged. Cardiac silhouette is mild-to-moderately enlarged. Calcified aortic knob. Pulmonary vascular congestion. Layering small to moderate RIGHT pleural effusion with interstitial to lesser extent faint alveolar airspace opacities. No pneumothorax. Soft tissue planes included osseous structures are nonsuspicious. IMPRESSION: Endotracheal tube tip projects 3.1 cm above the carina. Nasogastric tube past proximal stomach. Cardiomegaly, interstitial and to lesser extent alveolar edema with small to moderate layering RIGHT pleural effusion. Aortic Atherosclerosis (ICD10-I70.0). Electronically Signed   By: Elon Alas M.D.   On: 10/27/2016 01:18   Dg Abd Portable 1v  Result Date: 10/27/2016 CLINICAL DATA:  Verify orogastric tube placement. EXAM: PORTABLE ABDOMEN - 1 VIEW COMPARISON:  None. FINDINGS: The bowel gas pattern is normal. No radio-opaque calculi or other significant radiographic abnormality are seen. Nasogastric tube tip projects in distal stomach. IMPRESSION: Nasogastric tube tip projects in distal stomach. Nonspecific bowel gas pattern. Electronically Signed   By: Elon Alas M.D.   On: 10/27/2016 01:24    Medical decision making:  Discussed care with the patient Discussed care with the physician on the phone Reviewed labs and imaging personally Reviewed prior records  ASSESSMENT AND PLAN:  This is a 76 y.o. male with significant history of severe COPD, DM II, CKD 4, HTN, OSA on CPAP, hypothyroidism and atrial flutter on eliquis and amiodarone  presented with afib with slow ventricular response and increased lethergy/unresponsiveness requiring intubation. .   Active Problems:   Shock ([place])   Symptomatic Bradycardia leading to cardiogenic shock - likely related to worsening renal function and medication toxicity. Most recent TSH on 07/20/2016 - 4.0 - hold AV nodal blockers, amiodarone, hydrate patient, echo in the AM to evaluate worsening function, Keep potassium >4, calcium >9, magnesium >2, check TSH. Needs EP consult in the morning. Agree with glucogon admin as may have beta blocking effects - will need IV diuresis, will appreciate nephrology/CHF input  Acute on chronic diastolic heart failure, NYHA class IV with acute respiratory failure, currently ventilator dependent - will need IV diuresis with pressor support while on sedation   Recent episode of atypical chest pain - elevated troponin, recent low risk stress test for ischemia 9/18 - cycle troponin likely related to CKD/dehydration, will monitor for now   Paroxysmal atrial flutter/atrial fibrillation on eliquis: CHADS2VASc = 4 (age, CHF, HTN, DM) and on amiodarone - checking TSH, currently NSR, hold off amiodarone  AKI on CKD stage 4: His renal function has been gradually deteriorating over the past year. Last creatinine > 3. According to the patient, he does have a nephrologist at [place]. Avoid nephrotoxic medications   Signed, Flossie Dibble, MD MS 10/27/2016, 2:17 AM

## 2016-10-27 NOTE — ED Triage Notes (Signed)
Pt presents to ER via [facility] for unresponsiveness throughtout the day per family pt had R eye surgery this morning (10/26/16) and has been sleeping/somnolent throughout the day; family attempted to wake him at 1pm and he responded minimally; EMS arrived on scene to find patient CBG 46, amp D50 given and CBG then 135, continued to be unresponsive, HR found to be 40 with hx of bradycardia, EMS began pacing with capture at 15mA, rate 60, pt moved from home bed to EMS stretcher and lost capture, regained capture at 178mA, EMS states EKG unremarkable other than brady, 0.5mg  narcan given without change in mental status; pt arrived to ER with assisted ventilations with BVM, wash cloth covered R eye, no movement by patient

## 2016-10-27 NOTE — Procedures (Signed)
Central Venous Catheter Insertion Procedure Note Omar Lambert [id number] 1940-12-07  Procedure: Insertion of Central Venous Catheter Indications: Assessment of intravascular volume, Drug and/or fluid administration and Frequent blood sampling  Procedure Details Consent: Risks of procedure as well as the alternatives and risks of each were explained to the (patient/caregiver).  Consent for procedure obtained. to the wife Time Out: Verified patient identification, verified procedure, site/side was marked, verified correct patient position, special equipment/implants available, medications/allergies/relevent history reviewed, required imaging and test results available.  Performed  Maximum sterile technique was used including antiseptics, cap, gloves, gown, hand hygiene, mask and sheet. Skin prep: Chlorhexidine; local anesthetic administered A antimicrobial bonded/coated triple lumen catheter was placed in the right internal jugular vein using the Seldinger technique.  Evaluation Blood flow good Complications: No apparent complications Patient did tolerate procedure well. Chest X-ray ordered to verify placement.  CXR: normal.  Omar Lambert 10/27/2016, 7:53 AM

## 2016-10-27 NOTE — Sedation Documentation (Signed)
Pacing resumed-- 157mA, rate 72

## 2016-10-27 NOTE — ED Notes (Signed)
CBG-87, under employee number, edit sheet completed

## 2016-10-27 NOTE — Progress Notes (Signed)
   10/27/16 0200  Clinical Encounter Type  Visited With Patient and family together;Health care provider  Visit Type Initial  Referral From Nurse  Consult/Referral To Chaplain  Spiritual Encounters  Spiritual Needs Emotional  Stress Factors  Family Stress Factors Major life changes   Responded to a need while already in the ED.  Was able to support the staff as the cared for the patient.  Supported the family and coordinated meeting with the doctor.  Walked with the family as they visited the patient and provided prayer as well as ministry of presence.   Chaplain Katherene Ponto

## 2016-10-27 NOTE — ED Notes (Signed)
Central line good to use per Dr.Skatliffe

## 2016-10-27 NOTE — Progress Notes (Addendum)
Hypoglycemic Event  CBG: 64  Treatment: D50 IV 25 mL  Symptoms: Shaky  Follow-up CBG: Time:2030 CBG Result: 114  Possible Reasons for Event: Unknown  Comments/MD notified:Elink    Burman Blacksmith

## 2016-10-28 DIAGNOSIS — N189 Chronic kidney disease, unspecified: Secondary | ICD-10-CM

## 2016-10-28 DIAGNOSIS — N179 Acute kidney failure, unspecified: Secondary | ICD-10-CM

## 2016-10-28 DIAGNOSIS — R001 Bradycardia, unspecified: Secondary | ICD-10-CM

## 2016-10-28 LAB — POCT I-STAT 3, ART BLOOD GAS (G3+)
ACID-BASE DEFICIT: 1 mmol/L (ref 0.0–2.0)
Acid-base deficit: 4 mmol/L — ABNORMAL HIGH (ref 0.0–2.0)
BICARBONATE: 22.2 mmol/L (ref 20.0–28.0)
BICARBONATE: 22.1 mmol/L (ref 20.0–28.0)
Bicarbonate: 24.5 mmol/L (ref 20.0–28.0)
O2 SAT: 98 %
O2 Saturation: 97 %
O2 Saturation: 100 %
PCO2 ART: 31.8 mmHg — AB (ref 32.0–48.0)
PH ART: 7.483 — AB (ref 7.350–7.450)
PO2 ART: 102 mmHg (ref 83.0–108.0)
Patient temperature: 38.4
TCO2: 23 mmol/L (ref 22–32)
TCO2: 26 mmol/L (ref 22–32)
TCO2: 23 mmol/L (ref 22–32)
pCO2 arterial: 56.7 mmHg — ABNORMAL HIGH (ref 32.0–48.0)
pCO2 arterial: 29.4 mmHg — ABNORMAL LOW (ref 32.0–48.0)
pH, Arterial: 7.457 — ABNORMAL HIGH (ref 7.350–7.450)
pH, Arterial: 7.243 — ABNORMAL LOW (ref 7.350–7.450)
pO2, Arterial: 108 mmHg (ref 83.0–108.0)
pO2, Arterial: 224 mmHg — ABNORMAL HIGH (ref 83.0–108.0)

## 2016-10-28 LAB — GLUCOSE, CAPILLARY
GLUCOSE-CAPILLARY: 111 mg/dL — AB (ref 65–99)
GLUCOSE-CAPILLARY: 123 mg/dL — AB (ref 65–99)
GLUCOSE-CAPILLARY: 139 mg/dL — AB (ref 65–99)
Glucose-Capillary: 144 mg/dL — ABNORMAL HIGH (ref 65–99)
Glucose-Capillary: 141 mg/dL — ABNORMAL HIGH (ref 65–99)
Glucose-Capillary: 69 mg/dL (ref 65–99)

## 2016-10-28 LAB — HEMOGLOBIN A1C
Hgb A1c MFr Bld: 5.6 % (ref 4.8–5.6)
MEAN PLASMA GLUCOSE: 114.02 mg/dL

## 2016-10-28 LAB — CBC
HEMATOCRIT: 34.9 % — AB (ref 39.0–52.0)
Hemoglobin: 11.3 g/dL — ABNORMAL LOW (ref 13.0–17.0)
MCH: 30.7 pg (ref 26.0–34.0)
MCHC: 32.4 g/dL (ref 30.0–36.0)
MCV: 94.8 fL (ref 78.0–100.0)
Platelets: 104 10*3/uL — ABNORMAL LOW (ref 150–400)
RBC: 3.68 MIL/uL — AB (ref 4.22–5.81)
RDW: 15.6 % — AB (ref 11.5–15.5)
WBC: 17.5 10*3/uL — AB (ref 4.0–10.5)

## 2016-10-28 LAB — BASIC METABOLIC PANEL
ANION GAP: 14 (ref 5–15)
BUN: 70 mg/dL — ABNORMAL HIGH (ref 6–20)
CALCIUM: 8.2 mg/dL — AB (ref 8.9–10.3)
CO2: 21 mmol/L — AB (ref 22–32)
Chloride: 102 mmol/L (ref 101–111)
Creatinine, Ser: 5.64 mg/dL — ABNORMAL HIGH (ref 0.61–1.24)
GFR, EST AFRICAN AMERICAN: 10 mL/min — AB (ref >60)
GFR, EST NON AFRICAN AMERICAN: 9 mL/min — AB (ref >60)
GLUCOSE: 132 mg/dL — AB (ref 65–99)
POTASSIUM: 4.4 mmol/L (ref 3.5–5.1)
Sodium: 137 mmol/L (ref 135–145)

## 2016-10-28 LAB — TROPONIN I: TROPONIN I: 2.34 ng/mL — AB (ref <0.03)

## 2016-10-28 MED ORDER — SODIUM CHLORIDE 0.9 % IV SOLN
3.0000 ug/kg/min | INTRAVENOUS | Status: DC
  Administered 2016-10-28 – 2016-10-29 (×5): 3 ug/kg/min via INTRAVENOUS
  Administered 2016-10-30: 6 ug/kg/min via INTRAVENOUS
  Administered 2016-10-30: 5 ug/kg/min via INTRAVENOUS
  Filled 2016-10-28 (×6): qty 20

## 2016-10-28 MED ORDER — ETOMIDATE 2 MG/ML IV SOLN
20.0000 mg | Freq: Once | INTRAVENOUS | Status: AC
  Administered 2016-10-28: 20 mg via INTRAVENOUS

## 2016-10-28 MED ORDER — CISATRACURIUM BOLUS VIA INFUSION
0.0500 mg/kg | Freq: Once | INTRAVENOUS | Status: AC
  Administered 2016-10-28: 5.3 mg via INTRAVENOUS
  Filled 2016-10-28 (×2): qty 6

## 2016-10-28 MED ORDER — SODIUM CHLORIDE 0.9 % IV SOLN
2.0000 mg/h | INTRAVENOUS | Status: DC
  Administered 2016-10-28 (×2): 2 mg/h via INTRAVENOUS
  Administered 2016-10-30: 4 mg/h via INTRAVENOUS
  Filled 2016-10-28 (×4): qty 10

## 2016-10-28 MED ORDER — FENTANYL 2500MCG IN NS 250ML (10MCG/ML) PREMIX INFUSION
100.0000 ug/h | INTRAVENOUS | Status: DC
  Administered 2016-10-28: 300 ug/h via INTRAVENOUS
  Administered 2016-10-28: 200 ug/h via INTRAVENOUS
  Administered 2016-10-29 (×2): 300 ug/h via INTRAVENOUS
  Administered 2016-10-30: 280 ug/h via INTRAVENOUS
  Administered 2016-10-30: 300 ug/h via INTRAVENOUS
  Filled 2016-10-28 (×5): qty 250

## 2016-10-28 MED ORDER — FUROSEMIDE 10 MG/ML IJ SOLN
160.0000 mg | Freq: Once | INTRAMUSCULAR | Status: AC
  Administered 2016-10-28: 160 mg via INTRAVENOUS
  Filled 2016-10-28: qty 16

## 2016-10-28 MED ORDER — ARTIFICIAL TEARS OPHTHALMIC OINT
1.0000 "application " | TOPICAL_OINTMENT | Freq: Three times a day (TID) | OPHTHALMIC | Status: DC
  Administered 2016-10-28 – 2016-10-30 (×6): 1 via OPHTHALMIC
  Filled 2016-10-28: qty 3.5

## 2016-10-28 MED ORDER — MIDAZOLAM BOLUS VIA INFUSION
2.0000 mg | INTRAVENOUS | Status: DC | PRN
  Filled 2016-10-28: qty 2

## 2016-10-28 MED ORDER — FUROSEMIDE 10 MG/ML IJ SOLN
160.0000 mg | Freq: Four times a day (QID) | INTRAVENOUS | Status: DC
  Administered 2016-10-28 – 2016-10-29 (×4): 160 mg via INTRAVENOUS
  Filled 2016-10-28 (×6): qty 16

## 2016-10-28 MED ORDER — FENTANYL BOLUS VIA INFUSION
50.0000 ug | INTRAVENOUS | Status: DC | PRN
  Filled 2016-10-28: qty 50

## 2016-10-28 MED ORDER — MIDAZOLAM HCL 2 MG/2ML IJ SOLN: 2.0000 mg | Freq: Once | INTRAMUSCULAR | Status: DC | PRN

## 2016-10-28 NOTE — Progress Notes (Signed)
eLink Physician-Brief Progress Note Patient Name: Omar Lambert DOB: 1940/07/20 MRN: [id number]   Date of Service  10/28/2016  HPI/Events of Note  Fever to 101.6 F - Already cultured and started on Vancomycin and Zosyn. AST and ALT both elevated and Creatinine = 5.64. Will avoid Tylenol and NSAID's.  eICU Interventions  Will order: 1. Cooling blanket PRN.      Intervention Category Major Interventions: Infection - evaluation and management  Arta Stump Eugene 10/28/2016, 6:06 AM

## 2016-10-28 NOTE — Progress Notes (Signed)
Rt called to pt's room due to desat 60-70's at 0940.  RT suctioned pt with little secretions.  Rt removed pt from vent and used ambu bag to ventilate until MD arrived.  Pt placed back on vent for a bronchoscope procedure.

## 2016-10-28 NOTE — Progress Notes (Signed)
Nurse tech checked capillary cbg and result was 69. Pt on levophed and epi gtt. Line check 153. Will proceed with line checks only. Nurse tech informed. Will continue to monitor closely.  Eleonore Chiquito RN 2 heart

## 2016-10-28 NOTE — Progress Notes (Signed)
CRITICAL VALUE ALERT  Critical Value: Troponin 2.34  Date & Time Notied:  10/28/16 1145  Provider Notified: MD Oval Linsey   Orders Received/Actions taken: no new orders given at this time, will continue to monitor.

## 2016-10-28 NOTE — Consult Note (Signed)
Renal Service Consult Note [facility]  Omar Lambert 10/28/2016 Roney Jaffe D Requesting Physician:  DR Gilford Raid  Reason for Consult:  CKD pt w resp failure HPI: The patient is a 76 y.o. year-old with hx DM, CHF, thyroid disease and CKD stage IV who had eye surgery yesterday and became poorly responsive thereafter while at home.  Brought to ED for hypotension and bradycardia.  He did not arrest , was treated w/ TCP then IV epi and was intubated and is in ICU now.  On levo gtt for shock and epi gtt for heart rate.  CXR showed perihilar edema, CVP is high mid 20's, creat is 5.6 with eGFR of 10 ml/ min. Making small amts urine.  Asked to see for CKD.    Pt's wife states pt is f/b CKA , was seen earlier this week and they discussed starting to plan for dialysis and getting a fistula put in his arm soon.  She noted no recent CP, nsaids, n/v/d.      Home meds: -allopurinol/ / T4 / pantoprazole/ pravastatin -demadex 20 mg take 2 in am and 1 at noon -amiodarone/ apixaban -linagliptin 5 qd  CXR 9/22 - R > L perihilar infiltrates   ROS  denies CP  no joint pain   no HA  no blurry vision  no rash  no diarrhea  no nausea/ vomiting  no dysuria  no difficulty voiding  no change in urine color    Past Medical History  Past Medical History:  Diagnosis Date  . CHF (congestive heart failure) ([place])   . Diabetes mellitus without complication ([place])   . Thyroid disease    Past Surgical History No past surgical history on file. Family History No family history on file. Social History  has no tobacco, alcohol, and drug history on file. Allergies No Known Allergies Home medications Prior to Admission medications   Medication Sig Start Date End Date Taking? Authorizing Provider  acetaminophen (TYLENOL) 325 MG tablet Take 650 mg by mouth every 6 (six) hours as needed for mild pain.   Yes [provider]  allopurinol (ZYLOPRIM) 300 MG tablet Take 300 mg by mouth  daily.   Yes [provider]  amiodarone (PACERONE) 200 MG tablet Take 200 mg by mouth 2 (two) times daily.   Yes [provider]  apixaban (ELIQUIS) 5 MG TABS tablet Take 5 mg by mouth daily.    Yes [provider]  fluticasone (FLONASE) 50 MCG/ACT nasal spray Place 1 spray into both nostrils 2 (two) times daily.   Yes [provider]  levothyroxine (SYNTHROID, LEVOTHROID) 175 MCG tablet Take 175 mcg by mouth daily before breakfast.   Yes [provider]  linagliptin (TRADJENTA) 5 MG TABS tablet Take 5 mg by mouth daily.   Yes [provider]  pantoprazole (PROTONIX) 40 MG tablet Take 40 mg by mouth daily.   Yes [provider]  pravastatin (PRAVACHOL) 20 MG tablet Take 20 mg by mouth daily.   Yes [provider]  torsemide (DEMADEX) 20 MG tablet Take 20 mg by mouth See admin instructions. Take 2 tablets every morning and take 1 tablet at noon   Yes [provider]   Liver Function Tests  Recent Labs Lab 10/27/16 0025  AST 361*  ALT 290*  ALKPHOS 169*  BILITOT 1.9*  PROT 6.9  ALBUMIN 3.4*   No results for input(s): LIPASE, AMYLASE in the last 168 hours. CBC  Recent Labs Lab 10/27/16 0025  10/27/16 1528 10/27/16 1730 10/28/16 0418  WBC 11.2*  < > 15.2* 16.6* 17.5*  NEUTROABS 9.0*  --   --  14.8*  --   HGB 11.7*  < > 11.4* 11.3* 11.3*  HCT 40.9  < > 35.7* 35.6* 34.9*  MCV 109.1*  < > 99.2 97.5 94.8  PLT 145*  < > 113* 124* 104*  < > = values in this interval not displayed. Basic Metabolic Panel  Recent Labs Lab 10/27/16 0025 10/27/16 0400 10/27/16 1528 10/28/16 0418  NA 142  --  137 137  K 4.9  --  3.7 4.4  CL 100*  --  102 102  CO2 22  --  24 21*  GLUCOSE 100*  --  181* 132*  BUN 44*  --  59* 70*  CREATININE 3.96* 4.23* 4.71* 5.64*  CALCIUM 9.7  --  8.6* 8.2*   Iron/TIBC/Ferritin/ %Sat No results found for: IRON, TIBC, FERRITIN, IRONPCTSAT  Vitals:   10/28/16 0730 10/28/16 0800  10/28/16 0832 10/28/16 0850  BP:  128/68 (!) 131/58   Pulse: 62 71 65   Resp: (!) 24 (!) 24 (!) 24   Temp: (!) 100.8 F (38.2 C) 99.5 F (37.5 C)    TempSrc:  Core (Comment)    SpO2: 100% 100% 99% 99%  Weight:      Height:   _0  (1.727 m)    Exam Gen on vent, sedated, R eye patch, facial edema No rash, cyanosis or gangrene Sclera anicteric, throat w ETT No jvd or bruits Chest rales R base, L clear RRR no MRG Abd firm, mod distended, no hsm, no gross ascites GU normal male w foley draining darkish amber urine, clear MS no joint effusions or deformity Ext mild doughy LE edema / no wounds or ulcers Neuro is sedated on the vent    ECHO 9/22 > LVEF 60%, mod dilated RV, mod dilated RA, severe TR, ^PA pressure 38 mm hg; blunted respirophasic chg's , c/w ^CVP  Home meds: -allopurinol/ / T4 / pantoprazole/ pravastatin -demadex 20 mg take 2 in am and 1 at noon -amiodarone/ apixaban -linagliptin 5 qd  CXR 9/22 - R > L perihilar infiltrates  Na 137  K 4.4 BUN 70  CR 5.64  eGFR 10  CA 8.2  Hb 11.3   Impression: 1.  CKD stage IV/ V - pt approaching need for dialysis in OP setting.  Now here with bradycardia/ shock and possible sepsis (^wbc/ temp). Also has pulm edema and quite hypoxic, though out of proportion to CXR changes.  LV ok on echo.  Agree with trial of lasix IV now, if not improving would proceed with CRRT and initiation of chronic dialysis.   Have d/w CCM.  2.  Hypotension/ shock - on pressors, IV abx vanc/ zosyn 3.  Hx CHF - on torsemide at home 4.  DM2 oral agent 5.  AFib - on eliquis/ amio 6.  Vol - CVP up and +CHF by CXR, but not edematous  Plan - as above  Kelly Splinter MD [employer] pager [phone]   10/28/2016, 10:39 AM

## 2016-10-28 NOTE — Procedures (Signed)
Bronchoscopy Procedure Note KEYONTE COOKSTON [id number] March 05, 1940  Procedure: Bronchoscopy Indications: Diagnostic evaluation of the airways  Procedure Details Consent: Risks of procedure as well as the alternatives and risks of each were explained to the (patient/caregiver).  Consent for procedure obtained. Time Out: Verified patient identification, verified procedure, site/side was marked, verified correct patient position, special equipment/implants available, medications/allergies/relevent history reviewed, required imaging and test results available.  Performed  In preparation for procedure, patient was given 100% FiO2 and bronchoscope lubricated. Sedation: Etomidate  Airway entered and the following bronchi were examined: RUL, RML, RLL, LUL, LLL and Bronchi.   Procedures performed: Brushings performed - no Bronchoscope removed.  , Patient placed back on 100% FiO2 at conclusion of procedure.    Evaluation Hemodynamic Status: Persistent hypotension treated with pressors; O2 sats: stable throughout Patient's Current Condition: unstable Specimens:  None Complications: No apparent complications Patient did tolerate procedure well.   Raylene Miyamoto. 10/28/2016   1. No sig obstruction 2. Scant white mucous RUL, Left main Scope removed  Lavon Paganini. Titus Mould, MD, [place] Pgr: [facility]

## 2016-10-28 NOTE — Progress Notes (Addendum)
Progress Note  Patient Name: Omar Lambert Date of Encounter: 10/28/2016  Primary Cardiologist: Dr. Claiborne Billings   Subjective   Unable to obtain. Intubated and sedated.   Inpatient Medications    Scheduled Meds: . chlorhexidine gluconate (MEDLINE KIT)  15 mL Mouth Rinse BID  . Chlorhexidine Gluconate Cloth  6 each Topical Daily  . heparin  5,000 Units Subcutaneous Q8H  . insulin aspart  0-15 Units Subcutaneous Q4H  . mouth rinse  15 mL Mouth Rinse QID  . pantoprazole (PROTONIX) IV  40 mg Intravenous QHS   Continuous Infusions: . sodium chloride Stopped (10/27/16 1515)  . sodium chloride    . sodium chloride 100 mL/hr at 10/28/16 0600  . epinephrine 10 mcg/min (10/28/16 0600)  . fentaNYL infusion INTRAVENOUS 150 mcg/hr (10/28/16 0845)  . glucagon (GLUCAGEN) infusion (for beta blocker/calcium channel blocker overdose) 1 mg/hr (10/28/16 0539)  . norepinephrine (LEVOPHED) Adult infusion 4 mcg/min (10/28/16 0815)  . piperacillin-tazobactam (ZOSYN)  IV Stopped (10/28/16 0450)   PRN Meds: sodium chloride, sodium chloride, fentaNYL, midazolam   Vital Signs    Vitals:   10/28/16 0730 10/28/16 0800 10/28/16 0832 10/28/16 0850  BP:  128/68 (!) 131/58   Pulse: 62 71 65   Resp: (!) 24 (!) 24 (!) 24   Temp: (!) 100.8 F (38.2 C) 99.5 F (37.5 C)    TempSrc:  Core (Comment)    SpO2: 100% 100% 99% 99%  Weight:      Height:   '5\' 8"'  (1.727 m)     Intake/Output Summary (Last 24 hours) at 10/28/16 0855 Last data filed at 10/28/16 0800  Gross per 24 hour  Intake          4850.26 ml  Output              822 ml  Net          4028.26 ml   Filed Weights   10/27/16 0023 10/28/16 0500  Weight: 100 kg (220 lb 7.4 oz) 106.9 kg (235 lb 10.8 oz)    Telemetry    Atrial fibrillation. PVCs.  - Personally Reviewed  ECG    10/28/11: Atrial fibrillation.  Rate 69 bpm. - Personally Reviewed  Physical Exam   GEN: Critically ill-appearing.  Intubated and sedated.  Neck: No JVD Cardiac:  Irregularly irregular.  No murmurs, rubs, or gallops.  Respiratory: Clear to auscultation bilaterally. GI: Tense and mildly distended.  Hypoactive bowel sounds.  MS: No edema; No deformity. Neuro:  Nonfocal  Psych: Normal affect   Labs    Chemistry Recent Labs Lab 10/27/16 0025 10/27/16 0400 10/27/16 1528 10/28/16 0418  NA 142  --  137 137  K 4.9  --  3.7 4.4  CL 100*  --  102 102  CO2 22  --  24 21*  GLUCOSE 100*  --  181* 132*  BUN 44*  --  59* 70*  CREATININE 3.96* 4.23* 4.71* 5.64*  CALCIUM 9.7  --  8.6* 8.2*  PROT 6.9  --   --   --   ALBUMIN 3.4*  --   --   --   AST 361*  --   --   --   ALT 290*  --   --   --   ALKPHOS 169*  --   --   --   BILITOT 1.9*  --   --   --   GFRNONAA 13* 12* 11* 9*  GFRAA 15* 14* 13* 10*  ANIONGAP  20*  --  11 14     Hematology Recent Labs Lab 10/27/16 1528 10/27/16 1730 10/28/16 0418  WBC 15.2* 16.6* 17.5*  RBC 3.60* 3.65* 3.68*  HGB 11.4* 11.3* 11.3*  HCT 35.7* 35.6* 34.9*  MCV 99.2 97.5 94.8  MCH 31.7 31.0 30.7  MCHC 31.9 31.7 32.4  RDW 16.1* 15.7* 15.6*  PLT 113* 124* 104*    Cardiac EnzymesNo results for input(s): TROPONINI in the last 168 hours.  Recent Labs Lab 10/27/16 0038  TROPIPOC 0.15*     BNPNo results for input(s): BNP, PROBNP in the last 168 hours.   DDimer No results for input(s): DDIMER in the last 168 hours.   Radiology    Ct Head Wo Contrast  Result Date: 10/27/2016 CLINICAL DATA:  Altered level of consciousness. EXAM: CT HEAD WITHOUT CONTRAST TECHNIQUE: Contiguous axial images were obtained from the base of the skull through the vertex without intravenous contrast. COMPARISON:  None. FINDINGS: BRAIN: No intraparenchymal hemorrhage, mass effect nor midline shift. The ventricles and sulci are normal for age. No acute large vascular territory infarcts. No abnormal extra-axial fluid collections. Basal cisterns are patent. VASCULAR: Mild calcific atherosclerosis of the carotid siphons. SKULL: No skull  fracture. No significant scalp soft tissue swelling. SINUSES/ORBITS: The mastoid air-cells and included paranasal sinuses are well-aerated.The included ocular globes and orbital contents are non-suspicious. Status post RIGHT ocular lens implant. Prominent bilateral superior ophthalmic veins seen with bowel solid. OTHER: LEFT nasogastric tube. IMPRESSION: Negative noncontrast CT HEAD for age. Nasogastric tube terminates in pharynx.  Recommend advancement. Electronically Signed   By: Elon Alas M.D.   On: 10/27/2016 01:14   Dg Chest Portable 1 View  Result Date: 10/27/2016 CLINICAL DATA:  Central line placement EXAM: PORTABLE CHEST 1 VIEW COMPARISON:  10/27/2016 FINDINGS: Endotracheal tube tip measures 3.8 cm above the carina. Enteric tube tip is off the field of view but below the left hemidiaphragm. A right central venous catheter has been placed with tip over the mid SVC region. No pneumothorax. Cardiac enlargement. No vascular congestion. Small bilateral pleural effusions, greater on the right. Atelectasis or infiltration in both bases, also greater on the right. Calcification of the aorta. IMPRESSION: Appliances appear in satisfactory present shin. Persistent bilateral pleural effusions with infiltration or atelectasis in the lung bases, greater on the right. Electronically Signed   By: Lucienne Capers M.D.   On: 10/27/2016 03:02   Dg Chest Port 1 View  Result Date: 10/27/2016 CLINICAL DATA:  Post intubation. EXAM: PORTABLE CHEST 1 VIEW COMPARISON:  None. FINDINGS: Endotracheal tube tip projects 3.1 cm above the carina. Nasogastric tube past proximal stomach, distal tip not imaged. Cardiac silhouette is mild-to-moderately enlarged. Calcified aortic knob. Pulmonary vascular congestion. Layering small to moderate RIGHT pleural effusion with interstitial to lesser extent faint alveolar airspace opacities. No pneumothorax. Soft tissue planes included osseous structures are nonsuspicious. IMPRESSION:  Endotracheal tube tip projects 3.1 cm above the carina. Nasogastric tube past proximal stomach. Cardiomegaly, interstitial and to lesser extent alveolar edema with small to moderate layering RIGHT pleural effusion. Aortic Atherosclerosis (ICD10-I70.0). Electronically Signed   By: Elon Alas M.D.   On: 10/27/2016 01:18   Dg Abd Portable 1v  Result Date: 10/27/2016 CLINICAL DATA:  Verify orogastric tube placement. EXAM: PORTABLE ABDOMEN - 1 VIEW COMPARISON:  None. FINDINGS: The bowel gas pattern is normal. No radio-opaque calculi or other significant radiographic abnormality are seen. Nasogastric tube tip projects in distal stomach. IMPRESSION: Nasogastric tube tip projects in distal stomach. Nonspecific  bowel gas pattern. Electronically Signed   By: Elon Alas M.D.   On: 10/27/2016 01:24    Cardiac Studies   Echo 10/27/16: Study Conclusions  - Left ventricle: The cavity size was normal. Wall thickness was   increased in a pattern of moderate LVH. Systolic function was   vigorous. The estimated ejection fraction was in the range of 65%   to 70%. Wall motion was normal; there were no regional wall   motion abnormalities. - Ventricular septum: The contour showed diastolic flattening. - Right ventricle: The cavity size was moderately dilated. - Right atrium: The atrium was moderately dilated. - Tricuspid valve: There was severe regurgitation. - Pulmonary arteries: Systolic pressure was mildly to moderately   increased. PA peak pressure: 38 mm Hg (S).  Lexiscan Myoview 10/23/2016  There was no ST segment deviation noted during stress.  The study is normal.  This is a low risk study.  The left ventricular ejection fraction is mildly decreased (45-54%). Nuclear stress EF: 47%.   Patient Profile     76 y.o. male with paroxysmal atrial flutter, hypertension, OSA on CPAP, DM, CKD IV, hypothyroidism, and severe COPD here with symptomatic bradycardia and cardiogenic shock.  He  became bradycardic and unresponsive but reportedly never lost a pulse.  He was intubated and given atropine and epinephrine.   Assessment & Plan    # Atrial fibrillation/flutter: # Symptomatic bradycardia: Mr. Ord was on amiodarone as an outpatient but no other nodal agents.  He is no longer bradycardic on epinephrine.  Would wean NE first when able.  He will likely need a PPM once stable and leukocytosis has resolved.  His lactate was elevated but there were no other profound metabolic derangements that would explain his bradycardia on presentation.   # Acute on chronic diastolic heart failure: # Cardiogenic shock: Lactate improved to 2.9.  Co-ox yesterday was 72.8.  Likely mixed cardiogenic and septic given the leukocytosis and fever.  Currently on NE and epi.  Abdomen is tense.  Would recommend getting abdominal CT when stable per critical care.   # Elevated troponin:  Troponin elevated to 0.15.  Patient had a recent negative stress test in the setting of feeing fatigued.  No chest pain.  This is likely 2/2 shock and acute on chronic renal failure.  Will repeat to make sure there hasn't been a big increase.    # Acute on chronic renal failure IV:  Creatinine continues to worsen to 5.6 today.   Patient is oliguiric.  Would get nephrology involved.    # Fever: Mr. Keena developed a fever to 101.6 overnight and has a leukocytosis to 17.5 today.  Consider abdominal CT as there is no other clear source.    Time spent: 50 minutes-Greater than 50% of this time was spent in counseling, explanation of diagnosis, planning of further management, and coordination of care.    For questions or updates, please contact [place] Please consult [email or web address] for contact info under Cardiology/STEMI.      Signed, Skeet Latch, MD  10/28/2016, 8:55 AM

## 2016-10-28 NOTE — H&P (Signed)
NOTE-> THIS IS A MERGED CHART   Name: Omar Lambert MRN: [id number] DOB: [birth year]    ADMISSION DATE:  10/27/2016 CONSULTATION DATE: 10/27/16  REFERRING MD : ER ATTENDING DELO  CHIEF COMPLAINT:  BRADYCARDIA, UNRESPONSIVE  BRIEF PATIENT DESCRIPTION: 76 YR OLD MALE W/ PMHX CHF, CKD STAGE III/IV ( seen by Nephro this week and discussing possible dialysis), Afib RVR, Aflutter, DM presents after opthalmic surgery on 9/21 AM. Wife found him unresponsive at home at midnight. Bradycardic and unresponsive never lost pulse. Intubated, given atropine, started on Epinephrine, BP 69I systolic highest Hr in 50T. PCCM consulted   SIGNIFICANT EVENTS  ENDOTRACHEAL INTUBATION 9/23>> RIGHT IJ CVC 9/23>> A-LINE 9/23 >>   STUDIES:  CT HEAD CXR   SUBJECTIVE:  Weaning FIO2, sedated, remains on epi drip at 10 with levophed weaning. Await renal input.  VITAL SIGNS: Temp:  [98.6 F (37 C)-101.7 F (38.7 C)] 99.5 F (37.5 C) (09/23 0800) Pulse Rate:  [53-71] 65 (09/23 0832) Resp:  [16-24] 24 (09/23 0832) BP: (71-226)/(45-202) 131/58 (09/23 0832) SpO2:  [94 %-100 %] 99 % (09/23 0850) Arterial Line BP: (84-210)/(26-71) 146/62 (09/23 0800) FiO2 (%):  [80 %-100 %] 80 % (09/23 0850) Weight:  [235 lb 10.8 oz (106.9 kg)] 235 lb 10.8 oz (106.9 kg) (09/23 0500)  PHYSICAL EXAMINATION: General:  WNWD male , sedated on vent HEENT: ET-> vent, rt eye dressing UUE:[id number] Neuro: agitated when not sedated CV: HSR afib with vr 62 PULM: coarse rhonchi ZP:HXTA, non-tender, bsx4 active  Extremities: warm/dry, + edema  Skin: no rashes or lesions   Recent Labs Lab 10/27/16 0025 10/27/16 0400 10/27/16 1528 10/28/16 0418  NA 142  --  137 137  K 4.9  --  3.7 4.4  CL 100*  --  102 102  CO2 22  --  24 21*  BUN 44*  --  59* 70*  CREATININE 3.96* 4.23* 4.71* 5.64*  GLUCOSE 100*  --  181* 132*    Recent Labs Lab 10/27/16 1528 10/27/16 1730 10/28/16 0418  HGB 11.4* 11.3* 11.3*  HCT 35.7* 35.6* 34.9*   WBC 15.2* 16.6* 17.5*  PLT 113* 124* 104*   Ct Head Wo Contrast  Result Date: 10/27/2016 CLINICAL DATA:  Altered level of consciousness. EXAM: CT HEAD WITHOUT CONTRAST TECHNIQUE: Contiguous axial images were obtained from the base of the skull through the vertex without intravenous contrast. COMPARISON:  None. FINDINGS: BRAIN: No intraparenchymal hemorrhage, mass effect nor midline shift. The ventricles and sulci are normal for age. No acute large vascular territory infarcts. No abnormal extra-axial fluid collections. Basal cisterns are patent. VASCULAR: Mild calcific atherosclerosis of the carotid siphons. SKULL: No skull fracture. No significant scalp soft tissue swelling. SINUSES/ORBITS: The mastoid air-cells and included paranasal sinuses are well-aerated.The included ocular globes and orbital contents are non-suspicious. Status post RIGHT ocular lens implant. Prominent bilateral superior ophthalmic veins seen with bowel solid. OTHER: LEFT nasogastric tube. IMPRESSION: Negative noncontrast CT HEAD for age. Nasogastric tube terminates in pharynx.  Recommend advancement. Electronically Signed   By: Elon Alas M.D.   On: 10/27/2016 01:14   Dg Chest Portable 1 View  Result Date: 10/27/2016 CLINICAL DATA:  Central line placement EXAM: PORTABLE CHEST 1 VIEW COMPARISON:  10/27/2016 FINDINGS: Endotracheal tube tip measures 3.8 cm above the carina. Enteric tube tip is off the field of view but below the left hemidiaphragm. A right central venous catheter has been placed with tip over the mid SVC region. No pneumothorax. Cardiac enlargement. No vascular congestion.  Small bilateral pleural effusions, greater on the right. Atelectasis or infiltration in both bases, also greater on the right. Calcification of the aorta. IMPRESSION: Appliances appear in satisfactory present shin. Persistent bilateral pleural effusions with infiltration or atelectasis in the lung bases, greater on the right. Electronically  Signed   By: Lucienne Capers M.D.   On: 10/27/2016 03:02   Dg Chest Port 1 View  Result Date: 10/27/2016 CLINICAL DATA:  Post intubation. EXAM: PORTABLE CHEST 1 VIEW COMPARISON:  None. FINDINGS: Endotracheal tube tip projects 3.1 cm above the carina. Nasogastric tube past proximal stomach, distal tip not imaged. Cardiac silhouette is mild-to-moderately enlarged. Calcified aortic knob. Pulmonary vascular congestion. Layering small to moderate RIGHT pleural effusion with interstitial to lesser extent faint alveolar airspace opacities. No pneumothorax. Soft tissue planes included osseous structures are nonsuspicious. IMPRESSION: Endotracheal tube tip projects 3.1 cm above the carina. Nasogastric tube past proximal stomach. Cardiomegaly, interstitial and to lesser extent alveolar edema with small to moderate layering RIGHT pleural effusion. Aortic Atherosclerosis (ICD10-I70.0). Electronically Signed   By: Elon Alas M.D.   On: 10/27/2016 01:18   Dg Abd Portable 1v  Result Date: 10/27/2016 CLINICAL DATA:  Verify orogastric tube placement. EXAM: PORTABLE ABDOMEN - 1 VIEW COMPARISON:  None. FINDINGS: The bowel gas pattern is normal. No radio-opaque calculi or other significant radiographic abnormality are seen. Nasogastric tube tip projects in distal stomach. IMPRESSION: Nasogastric tube tip projects in distal stomach. Nonspecific bowel gas pattern. Electronically Signed   By: Elon Alas M.D.   On: 10/27/2016 01:24   EKG - atrial fibrillation with slow ventricular response with RBBB low QRS voltage  ECHO 08/18/2015 - Left ventricle: The cavity size was normal. There was moderate concentric hypertrophy. Systolic function was normal. The estimated ejection fraction was in the range of 60% to 65%. Wall motion was normal; there were no regional wall motion abnormalities. - Left atrium: The atrium was moderately dilated. - Right ventricle: The cavity size was mildly dilated.  Wall thickness was increased. - Right atrium: The atrium was moderately dilated. - Tricuspid valve: There was moderate regurgitation. - Pulmonary arteries: Systolic pressure was mildly increased. PA peak pressure: 40 mm Hg (S).  Stress test nuclear lexiscan 10/23/2016  There was no ST segment deviation noted during stress.  The study is normal.  This is a low risk study.  The left ventricular ejection fraction is mildly decreased (45-54%).  Nuclear stress EF: 47%.  Low risk stress nuclear study with normal perfusion and mildly reduced left ventricularsystolic function. Prominent paradoxical septal motion, consider right heart overload. Suggest corroboration with echo  ASSESSMENT / PLAN:  NEURO: GCS 8  Sedation: Goal RASS 0 to -1 Started on propofol ggt  Still requiring prn versed pushes Analgesia: fentanyl ggt No seizures  CTH negative for any acute pathology Wakes up off sedation 9/23  CARDIAC: Shock Cardiogenic vs Sepsis Hold anti-hypertensives AV nodal blockade and amiodarone MAP goal >21mmHg BP HR dept.  Pt has a h/o CHF, Afib RVR, Aflutter Could bradycardia be secondary to betablockade received during opthalmic procedure ( eg. Timolol) Wife does not know what he received for sedation of for procedure Will try challenge with glucagon IV to see if HR improves(it did not) Consulted Cardiology - pt was bradycardic on Epinephrine Placed CVC in RIJ MVO2 71 Arterial Line placed for hemodynamic montioring EKG reviewed Last EF 47%  Acute on chronic diastolic failure CXR pulmonary edema with bilateral effusions Cardiology would like diuresis once MAPs improve Recommended adding Levophed.  They agreed with glucagon challenge   PULMONARY: Intubated on PRVC Mechanical ventilation CXR shows cardiogenic pulmonary edema Arterial Line was placed for frequent ABGS Needs diuresis prior to weaning from ventilator, will confirm renal is coming to see pt. Changes made  on vent abg pending  ID: Blood cx x 2 sent Afebrile but leukocytosis with left shift Pt had h/o cough may be cardiac in nature but covering with broad spectrum antibiotics renally dosed  Endocrine: Check TSH H/o insulin dependence Started on ICU Glycemic protocol  Goal BG 140-180mg /dl With glucagon trial anticipate blood sugars may be labile   GI: NPO OGT placed and in position If pt hemodynamically stable will evaluate for TF GI PPx for Protonix on board  Heme:  Recent Labs  10/27/16 1730 10/28/16 0418  HGB 11.3* 11.3*    hgb 12 Platelets 139 No active sources of bleeding  If Hgb<8  transfuse PRBCs .Marland KitchenA POS No h/o coagulopathy DVT PPx-> heparin [place] and SCDs  RENAL Lab Results  Component Value Date   CREATININE 5.64 (H) 10/28/2016   CREATININE 4.71 (H) 10/27/2016   CREATININE 4.23 (H) 10/27/2016    Recent Labs Lab 10/27/16 0025 10/27/16 1528 10/28/16 0418  K 4.9 3.7 4.4     CKD Stage 3 (G3bA2) at previously documented baseline Per wife kidney function had worsened and this week had an appt with Neurology discussing HD and graft placement 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 Kt goal>4 Calcium>9 Mg >2 Consult placed to Nephrology He will most  need HD or CRRT. Pressors are more for HR than BP. Once renal sees will follow their direction. Suspect one dose of lasix would not hurt. .. Lab Results  Component Value Date   CREATININE 5.64 (H) 10/28/2016   CREATININE 4.71 (H) 10/27/2016   CREATININE 4.23 (H) 02/54/2706   Metabolic Acidosis  Lactate elevated at 11.4 previously 9.63-> 2.3   DISPOSITION:ICU PROGNOSIS: Guarded CODE STATUS: Full FAMILY: Wife is Next of Kin , she and son in law updated.  Richardson Landry Minor ACNP Maryanna Shape PCCM Pager [phone] till 3 pm If no answer page [phone]  STAFF NOTE: I, Merrie Roof, MD FACP have personally reviewed  patient's available data, including medical history, events of note, physical examination and test results as part of my evaluation. I have discussed with resident/NP and other care providers such as pharmacist, RN and RRT. In addition, I personally evaluated patient and elicited key findings of: not responding to commands, obese, ett, jvd up, crackles and ronchi bilateral, slight reduced on right, abdo obese, low BS , no r/g, edema, eye coverged rt, pcxr which I reviewed shows diffuse int changes ett nl, presentation is of found unresposnive post procedure, unclear if arrythmia started this and lead to aspiration, shock, resp failure and now continued overload in setting of cri, he has worsening hypoxia and hypercpnia and dyschrony, emergent bronch did not show obstruction and I am suspcion for pulm edema as major issue of resp failure issues in settgin CRI and worsening renal status, with neg bronch will start high dose lasix and asses response, if limited response may need cvvhd, ABg reviewed, incrase MV, peep, abg to repeat, ABX for asp maintain, I am still concerned about anix and did he lose a pulse at any stage?, lactic has cleared, renal called, I updated family in full, because of dyschrony, will need paralysis and deeper sedation prior  The patient  is critically ill with multiple organ systems failure and requires high complexity decision making for assessment and support, frequent evaluation and titration of therapies, application of advanced monitoring technologies and extensive interpretation of multiple databases.   Critical Care Time devoted to patient care services described in this note is 40 Minutes. This time reflects time of care of this signee: Merrie Roof, MD FACP. This critical care time does not reflect procedure time, or teaching time or supervisory time of PA/NP/Med student/Med Resident etc but could involve care discussion time. Rest per NP/medical resident whose note is  outlined above and that I agree with   Lavon Paganini. Titus Mould, MD, [place] Pgr: [facility] 10/28/2016 2:19 PM   10/28/2016, 9:06 AM

## 2016-10-28 NOTE — Progress Notes (Signed)
Vent changes made per NP. ABG ordered for follow-up.

## 2016-10-28 NOTE — Plan of Care (Signed)
Problem: Activity: Goal: Ability to tolerate increased activity will improve Outcome: Not Progressing Pt now on neuromuscular blockade  Problem: Coping: Goal: Level of anxiety will decrease Outcome: Not Progressing High levels of sedation required. Now on NMB  Problem: Respiratory: Goal: Ability to maintain a clear airway and adequate ventilation will improve Outcome: Progressing Within parameters now on NMB  Problem: Role Relationship: Goal: Method of communication will improve Outcome: Not Progressing See previous

## 2016-10-28 NOTE — Progress Notes (Signed)
eLink Physician-Brief Progress Note Patient Name: Omar Lambert DOB: 12-Aug-1940 MRN: [id number]   Date of Service  10/28/2016  HPI/Events of Note  300 cc uop in resp to lasix 160  eICU Interventions  Continue lasix 160 q 6 h x next 12 hours as per Dr Janit Pagan recs and hold off HD for now     Intervention Category Major Interventions: Acute renal failure - evaluation and management  Christinia Gully 10/28/2016, 4:55 PM

## 2016-10-29 ENCOUNTER — Inpatient Hospital Stay (HOSPITAL_COMMUNITY): Payer: Medicare HMO

## 2016-10-29 DIAGNOSIS — J9601 Acute respiratory failure with hypoxia: Secondary | ICD-10-CM

## 2016-10-29 DIAGNOSIS — Z7189 Other specified counseling: Secondary | ICD-10-CM

## 2016-10-29 LAB — GLUCOSE, CAPILLARY
GLUCOSE-CAPILLARY: 105 mg/dL — AB (ref 65–99)
GLUCOSE-CAPILLARY: 124 mg/dL — AB (ref 65–99)
GLUCOSE-CAPILLARY: 130 mg/dL — AB (ref 65–99)
GLUCOSE-CAPILLARY: 134 mg/dL — AB (ref 65–99)
GLUCOSE-CAPILLARY: 148 mg/dL — AB (ref 65–99)
GLUCOSE-CAPILLARY: 151 mg/dL — AB (ref 65–99)
Glucose-Capillary: 64 mg/dL — ABNORMAL LOW (ref 65–99)
Glucose-Capillary: 87 mg/dL (ref 65–99)
Glucose-Capillary: 92 mg/dL (ref 65–99)

## 2016-10-29 LAB — POCT I-STAT 3, ART BLOOD GAS (G3+)
Acid-base deficit: 6 mmol/L — ABNORMAL HIGH (ref 0.0–2.0)
BICARBONATE: 22.6 mmol/L (ref 20.0–28.0)
O2 Saturation: 92 %
PO2 ART: 78 mmHg — AB (ref 83.0–108.0)
Patient temperature: 97.5
TCO2: 24 mmol/L (ref 22–32)
pCO2 arterial: 56.7 mmHg — ABNORMAL HIGH (ref 32.0–48.0)
pH, Arterial: 7.205 — ABNORMAL LOW (ref 7.350–7.450)

## 2016-10-29 LAB — RENAL FUNCTION PANEL
ALBUMIN: 2.5 g/dL — AB (ref 3.5–5.0)
Anion gap: 14 (ref 5–15)
BUN: 83 mg/dL — AB (ref 6–20)
CO2: 21 mmol/L — AB (ref 22–32)
Calcium: 8 mg/dL — ABNORMAL LOW (ref 8.9–10.3)
Chloride: 98 mmol/L — ABNORMAL LOW (ref 101–111)
Creatinine, Ser: 6.98 mg/dL — ABNORMAL HIGH (ref 0.61–1.24)
GFR calc Af Amer: 8 mL/min — ABNORMAL LOW (ref >60)
GFR calc non Af Amer: 7 mL/min — ABNORMAL LOW (ref >60)
GLUCOSE: 99 mg/dL (ref 65–99)
PHOSPHORUS: 5.4 mg/dL — AB (ref 2.5–4.6)
POTASSIUM: 4.4 mmol/L (ref 3.5–5.1)
Sodium: 133 mmol/L — ABNORMAL LOW (ref 135–145)

## 2016-10-29 LAB — CBC
HCT: 33.6 % — ABNORMAL LOW (ref 39.0–52.0)
HEMOGLOBIN: 11.4 g/dL — AB (ref 13.0–17.0)
MCH: 30.7 pg (ref 26.0–34.0)
MCHC: 33.9 g/dL (ref 30.0–36.0)
MCV: 90.6 fL (ref 78.0–100.0)
PLATELETS: 116 10*3/uL — AB (ref 150–400)
RBC: 3.71 MIL/uL — AB (ref 4.22–5.81)
RDW: 15.3 % (ref 11.5–15.5)
WBC: 11.1 10*3/uL — AB (ref 4.0–10.5)

## 2016-10-29 LAB — BASIC METABOLIC PANEL
ANION GAP: 14 (ref 5–15)
BUN: 79 mg/dL — ABNORMAL HIGH (ref 6–20)
CHLORIDE: 98 mmol/L — AB (ref 101–111)
CO2: 21 mmol/L — AB (ref 22–32)
CREATININE: 6.5 mg/dL — AB (ref 0.61–1.24)
Calcium: 8 mg/dL — ABNORMAL LOW (ref 8.9–10.3)
GFR calc non Af Amer: 7 mL/min — ABNORMAL LOW (ref >60)
GFR, EST AFRICAN AMERICAN: 8 mL/min — AB (ref >60)
Glucose, Bld: 124 mg/dL — ABNORMAL HIGH (ref 65–99)
Potassium: 4 mmol/L (ref 3.5–5.1)
SODIUM: 133 mmol/L — AB (ref 135–145)

## 2016-10-29 LAB — PROCALCITONIN: PROCALCITONIN: 93.09 ng/mL

## 2016-10-29 LAB — MAGNESIUM: MAGNESIUM: 1.7 mg/dL (ref 1.7–2.4)

## 2016-10-29 LAB — PHOSPHORUS: Phosphorus: 4.5 mg/dL (ref 2.5–4.6)

## 2016-10-29 MED ORDER — ORAL CARE MOUTH RINSE
15.0000 mL | OROMUCOSAL | Status: DC
  Administered 2016-10-29 – 2016-11-06 (×75): 15 mL via OROMUCOSAL

## 2016-10-29 MED ORDER — CHLORHEXIDINE GLUCONATE CLOTH 2 % EX PADS
6.0000 | MEDICATED_PAD | Freq: Every day | CUTANEOUS | Status: DC
  Administered 2016-10-30: 6 via TOPICAL

## 2016-10-29 MED ORDER — SODIUM CHLORIDE 0.9% FLUSH
10.0000 mL | Freq: Two times a day (BID) | INTRAVENOUS | Status: DC
  Administered 2016-10-29 – 2016-10-31 (×4): 10 mL

## 2016-10-29 MED ORDER — PRISMASOL BGK 4/2.5 32-4-2.5 MEQ/L IV SOLN
INTRAVENOUS | Status: DC
  Administered 2016-10-29 – 2016-11-02 (×6): via INTRAVENOUS_CENTRAL
  Filled 2016-10-29 (×8): qty 5000

## 2016-10-29 MED ORDER — PRISMASOL BGK 4/2.5 32-4-2.5 MEQ/L IV SOLN
INTRAVENOUS | Status: DC
  Administered 2016-10-29 – 2016-11-02 (×24): via INTRAVENOUS_CENTRAL
  Filled 2016-10-29 (×31): qty 5000

## 2016-10-29 MED ORDER — PRISMASOL BGK 4/2.5 32-4-2.5 MEQ/L IV SOLN
INTRAVENOUS | Status: DC
  Administered 2016-10-29 – 2016-11-02 (×5): via INTRAVENOUS_CENTRAL
  Filled 2016-10-29 (×8): qty 5000

## 2016-10-29 MED ORDER — HEPARIN (PORCINE) 2000 UNITS/L FOR CRRT
INTRAVENOUS_CENTRAL | Status: DC | PRN
  Administered 2016-10-31: via INTRAVENOUS_CENTRAL

## 2016-10-29 MED ORDER — HEPARIN SODIUM (PORCINE) 1000 UNIT/ML DIALYSIS
1000.0000 [IU] | INTRAMUSCULAR | Status: DC | PRN
  Administered 2016-10-29: 3000 [IU] via INTRAVENOUS_CENTRAL
  Administered 2016-11-02: 3200 [IU] via INTRAVENOUS_CENTRAL
  Filled 2016-10-29 (×2): qty 6
  Filled 2016-10-29: qty 4
  Filled 2016-10-29: qty 5
  Filled 2016-10-29: qty 4

## 2016-10-29 MED ORDER — PIPERACILLIN-TAZOBACTAM IN DEX 2-0.25 GM/50ML IV SOLN
2.2500 g | Freq: Four times a day (QID) | INTRAVENOUS | Status: DC
  Administered 2016-10-29 – 2016-11-03 (×19): 2.25 g via INTRAVENOUS
  Filled 2016-10-29 (×20): qty 50

## 2016-10-29 MED ORDER — MAGNESIUM SULFATE IN D5W 1-5 GM/100ML-% IV SOLN
1.0000 g | Freq: Once | INTRAVENOUS | Status: AC
Start: 2016-10-29 — End: 2016-10-29
  Administered 2016-10-29: 1 g via INTRAVENOUS
  Filled 2016-10-29: qty 100

## 2016-10-29 MED ORDER — CHLORHEXIDINE GLUCONATE 0.12% ORAL RINSE (MEDLINE KIT)
15.0000 mL | Freq: Two times a day (BID) | OROMUCOSAL | Status: DC
  Administered 2016-10-30 – 2016-11-06 (×15): 15 mL via OROMUCOSAL

## 2016-10-29 MED ORDER — SODIUM CHLORIDE 0.9% FLUSH: 10.0000 mL | INTRAVENOUS | Status: DC | PRN

## 2016-10-29 NOTE — Consult Note (Addendum)
[place] Nurse wound consult note Reason for Consult: Consult requested for right inner ankle full thickness wounds. Pt's wife at the bedside is well-informed regarding topical treatment and states he is followed by an outpatient wound center prior to admission.  They are using Silver Gel Q day, which is not available in the [place] [facility] [place]. Wound type: .2X.2X.2cm and 1X1.5X.2cm, both have pale white wound beds, small amt yellow drainage, no odor. Periwound: Intact skin surrounding Dressing procedure/placement/frequency: Discussed that we do not have Silver Gel, and can substitute Aquacel. Cut small piece of Aquacel Kellie Simmering # [phone]) and tuck over right ankle wounds Q day, then cover with foam dressing. (Change foam dressing Q 3 days or PRN soiling.)  If patient's wife wants to bring in Silver gel from home, then this can be applied Q day instead of Aquacel and re-cover with foam dressing.  Discussed plan of care with pt's wife and she verbalized understanding.   Please re-consult if further assistance is needed.  Thank-you,  Julien Girt MSN, [place], [place], [place], [place]

## 2016-10-29 NOTE — Care Management Note (Addendum)
Case Management Note Marvetta Gibbons RN, BSN Unit 4E-Case Manager-- [place] coverage [phone]  Patient Details  Name: Omar Lambert MRN: [id number] Date of Birth: 01-14-1941  Subjective/Objective:   Pt admitted with bradycardia and unresponsive at home- recent opthalmic surgery on 9/21- Post surgery wife found him unresponsive at home. Bradycardic and unresponsive never lost pulse. Currently Intubated and on Vent and remains on epi and levophed            Action/Plan: PTA pt lived at home with wife- CM to follow for d/c needs  Expected Discharge Date:                  Expected Discharge Plan:     In-House Referral:     Discharge planning Services  CM Consult  Post Acute Care Choice:    Choice offered to:     DME Arranged:    DME Agency:     HH Arranged:    [place] Agency:     Status of Service:  In process, will continue to follow  If discussed at Long Length of Stay Meetings, dates discussed:    Discharge Disposition:   Additional Comments:  Dawayne Patricia, RN 10/29/2016, 10:10 AM

## 2016-10-29 NOTE — Progress Notes (Signed)
Name: Omar Lambert MRN: [id number] DOB: February 03, 1941    ADMISSION DATE:  10/27/2016 CONSULTATION DATE: 10/27/16  REFERRING MD : ER ATTENDING DELO  CHIEF COMPLAINT:  BRADYCARDIA, UNRESPONSIVE  BRIEF PATIENT DESCRIPTION: 76 YR OLD MALE W/ PMHX CHF, CKD STAGE III/IV ( seen by Nephro this week and discussing possible dialysis), Afib RVR, A flutter, DM presents after opthalmic surgery on 9/21 AM. Post surgery wife found him unresponsive at home at midnight. Bradycardic and unresponsive never lost pulse. Intubated, given atropine, started on Epinephrine, BP 20N systolic highest Hr in 47S. PCCM consulted   SIGNIFICANT EVENTS  ETT 9/23>> RIGHT IJ CVC 9/23>> A-LINE 9/23 >>   STUDIES:  CT HEAD 9/22>>> Negative noncontrast CT HEAD for age. Nasogastric tube terminates in pharynx.  Recommend advancement.   SUBJECTIVE:  Remains paralyzed, sedated.  Remains on epi gtt r/t bradycardia, levophed 13mcg.    VITAL SIGNS: Temp:  [96.8 F (36 C)-98.2 F (36.8 C)] 98.1 F (36.7 C) (09/24 0800) Pulse Rate:  [62-81] 80 (09/24 0800) Resp:  [0-35] 35 (09/24 0800) BP: (102-148)/(61-87) 123/72 (09/24 0400) SpO2:  [56 %-100 %] 100 % (09/24 0800) Arterial Line BP: (95-177)/(45-77) 125/53 (09/24 0800) FiO2 (%):  [60 %-90 %] 60 % (09/24 0311) Weight:  [104 kg (229 lb 4.5 oz)] 104 kg (229 lb 4.5 oz) (09/24 0455)  PHYSICAL EXAMINATION: General:  WNWD male , sedated on vent HEENT: ETT vent, rt eye dressing Neuro: RASS-5, sedated, NMB  CV: s1s2 irreg, HR 82 on epi gtt PULM: resps even non labored on vent, peak pressures remain elevated - 35-58 on NMB, coarse rhonchi JG:GEZM, non-tender, bsx4 active  Extremities: warm/dry, + edema  Skin: no rashes or lesions   Recent Labs Lab 10/27/16 1528 10/28/16 0418 10/29/16 0450  NA 137 137 133*  K 3.7 4.4 4.0  CL 102 102 98*  CO2 24 21* 21*  BUN 59* 70* 79*  CREATININE 4.71* 5.64* 6.50*  GLUCOSE 181* 132* 124*    Recent Labs Lab 10/27/16 1730  10/28/16 0418 10/29/16 0450  HGB 11.3* 11.3* 11.4*  HCT 35.6* 34.9* 33.6*  WBC 16.6* 17.5* 11.1*  PLT 124* 104* 116*   Dg Chest Port 1 View  Result Date: 10/29/2016 CLINICAL DATA:  Respiratory failure.  Endotracheal tube present. EXAM: PORTABLE CHEST 1 VIEW COMPARISON:  10/27/2016 FINDINGS: Endotracheal tube terminates 3.3 cm above carina.Nasogastric extends beyond the inferior aspect of the film. Right internal jugular line tip at mid SVC. Midline trachea. Cardiomegaly accentuated by AP portable technique. Atherosclerosis in the transverse aorta. Probable small bilateral pleural effusions. Mild pulmonary venous congestion. Persistent bibasilar airspace disease. IMPRESSION: Given differences in technique, similar cardiomegaly with mild pulmonary venous congestion and small bilateral pleural effusions. Electronically Signed   By: Abigail Miyamoto M.D.   On: 10/29/2016 07:10   EKG - atrial fibrillation with slow ventricular response with RBBB low QRS voltage  ECHO Old-- 08/18/2015>> - Left ventricle: The cavity size was normal. There was moderate concentric hypertrophy. Systolic function was normal. The estimated ejection fraction was in the range of 60% to 65%. Wall motion was normal; there were no regional wall motion abnormalities. - Left atrium: The atrium was moderately dilated. - Right ventricle: The cavity size was mildly dilated. Wall thickness was increased. - Right atrium: The atrium was moderately dilated. - Tricuspid valve: There was moderate regurgitation. - Pulmonary arteries: Systolic pressure was mildly increased. PA peak pressure: 40 mm Hg (S).  Stress test nuclear lexiscan 10/23/2016  There was no  ST segment deviation noted during stress.  The study is normal.  This is a low risk study.  The left ventricular ejection fraction is mildly decreased (45-54%).  Nuclear stress EF: 47%.  ASSESSMENT / PLAN:  NEURO: AMS - multifactorial  P:  Sedation: Goal  RASS -5 Continue fentanyl, versed while on NMB Initial CTH negative for any acute pathology Per nurse has never followed commands since admit  Consider f/u CT head   CARDIAC: Shock Cardiogenic vs Sepsis. Recent neg stress test. Possible aspiration PNA  Bradycardia - ?r/t  betablockade received during opthalmic procedure ( eg. Timolol) although did not improve with glucagon.  Elevated troponin  Hx CHF, Afib/flutter  Acute on chronic diastolic CHF  P:  Cards following  Continue epi gtt for HR - ??pacemaker if continues to require epi gtt  Hold home antiHTN, amiodarone  Wean levophed as able for MAP goal >12mmHg Diuresis once BP improved  Will d/c glucagon after this bag - no sig change when started    PULMONARY: Acute respiratory failure - multifactorial r/t acute on chronic CHF, AMS, volume overload  Significant vent dyssynchrony 9/23 requiring NMB  ?aspiration  P:  Vent support - 8cc/kg  F/u CXR  F/u ABG  continue nimbex today, wean off in am 9/25 Diuresis per renal    ID: ?aspiration  Cough prior to admit  P:  Continue empiric abx for now  Trend pct  Follow cultures   Endocrine: DM  P:  TSH pending  SSI  Watch blood sugars with glucagon    GI: NO active issue  P: NPO OGT placed and in position If pt hemodynamically stable will evaluate for TF GI PPx for Protonix on board  Heme: No active issue SQ heparin  F/u CBC   RENAL Acute on CKD Stage 4  Metabolic Acidosis  Per wife kidney function had worsened and this week had an appt with Neurology discussing HD and graft placement P:  Renal following  Likely needs CVVHD v iHD - will place HD cath sometime today  Diuresing well  F/u chem  Trend lactate - improving     DISPOSITION:ICU PROGNOSIS: Guarded CODE STATUS: Full FAMILY: discussed at length with wife at bedside 9/24   [name] Madrid, NP 10/29/2016  9:41 AM Pager: [phone] or [phone]

## 2016-10-29 NOTE — Procedures (Signed)
Hemodialysis Catheter Insertion Procedure Note VITALY WANAT [id number] November 20, 1940  Procedure: Insertion of Hemodialysis Catheter Indications: CRRT  Procedure Details Consent: Unable to obtain consent because of altered level of consciousness. Time Out: Verified patient identification, verified procedure, site/side was marked, verified correct patient position, special equipment/implants available, medications/allergies/relevent history reviewed, required imaging and test results available.  Performed  Maximum sterile technique was used including antiseptics, cap, gloves, gown, hand hygiene, mask and sheet. Skin prep: Chlorhexidine; local anesthetic administered A antimicrobial bonded/coated triple lumen catheter was placed in the right femoral vein due to multiple attempts, no other available access using the Seldinger technique.  Evaluation Blood flow good Complications: No apparent complications Patient did tolerate procedure well. Chest X-ray ordered to verify placement.  CXR: not needed (ordered to ensure no pneumothorax post attempted left IJ).   Procedure performed under direct ultrasound guidance for real time vessel cannulation.      Montey Hora, [facility] Pgr: [phone]  or [phone] 10/29/2016, 3:16 PM

## 2016-10-29 NOTE — Progress Notes (Signed)
Subjective:  Remains in ICU on epi and norepi- good UOP about 200 every 2 hours with lasix but labs worsening.  Daughter is at bedside saying that pt did not object to dialysis when discussed from Dr. Jimmy Footman so she thinks he would want Objective Vital signs in last 24 hours: Vitals:   10/29/16 0600 10/29/16 0615 10/29/16 0700 10/29/16 0800  BP:      Pulse: 76 77 78 80  Resp: (!) 35 (!) 35 (!) 35 (!) 35  Temp: 97.7 F (36.5 C) 97.7 F (36.5 C) 97.7 F (36.5 C) 98.1 F (36.7 C)  TempSrc:    Core (Comment)  SpO2: 100% 100% 100% 100%  Weight:      Height:       Weight change: -2.9 kg (-6 lb 6.3 oz)  Intake/Output Summary (Last 24 hours) at 10/29/16 1791 Last data filed at 10/29/16 0800  Gross per 24 hour  Intake          4342.55 ml  Output             2355 ml  Net          1987.55 ml    Assessment/ Plan: Pt is a 76 y.o. yo male who was admitted on 10/27/2016 with being poorly responsive after eye surgery- has advanced CKD at baseline- now with A on CRF  Assessment/Plan: 1. Renal- non oliguric A on CRF- apparently plans starting to be made for dialysis as OP- pt uremic and in the setting of being critically ill even though no absolute indications it may be best to initiate in order for him to overall imporve.  Daughter agrees, will speak with CCM about access and will need CRRT  2. HTN/vol- is overloaded by exam- responding to lasix for now until CRRT started  3. Anemia- hgb over 11- no action needed yet     Tayli Buch A    Labs: Basic Metabolic Panel:  Recent Labs Lab 10/27/16 1528 10/28/16 0418 10/29/16 0450  NA 137 137 133*  K 3.7 4.4 4.0  CL 102 102 98*  CO2 24 21* 21*  GLUCOSE 181* 132* 124*  BUN 59* 70* 79*  CREATININE 4.71* 5.64* 6.50*  CALCIUM 8.6* 8.2* 8.0*  PHOS  --   --  4.5   Liver Function Tests:  Recent Labs Lab 10/27/16 0025  AST 361*  ALT 290*  ALKPHOS 169*  BILITOT 1.9*  PROT 6.9  ALBUMIN 3.4*   No results for input(s):  LIPASE, AMYLASE in the last 168 hours. No results for input(s): AMMONIA in the last 168 hours. CBC:  Recent Labs Lab 10/27/16 0025 10/27/16 0400 10/27/16 1528 10/27/16 1730 10/28/16 0418 10/29/16 0450  WBC 11.2* 18.3* 15.2* 16.6* 17.5* 11.1*  NEUTROABS 9.0*  --   --  14.8*  --   --   HGB 11.7* 12.0* 11.4* 11.3* 11.3* 11.4*  HCT 40.9 41.0 35.7* 35.6* 34.9* 33.6*  MCV 109.1* 107.9* 99.2 97.5 94.8 90.6  PLT 145* 139* 113* 124* 104* 116*   Cardiac Enzymes:  Recent Labs Lab 10/28/16 1141  TROPONINI 2.34*   CBG:  Recent Labs Lab 10/28/16 1638 10/28/16 2020 10/28/16 2117 10/29/16 0022 10/29/16 0503  GLUCAP 141* 69 151* 148* 124*    Iron Studies: No results for input(s): IRON, TIBC, TRANSFERRIN, FERRITIN in the last 72 hours. Studies/Results: Dg Chest Port 1 View  Result Date: 10/29/2016 CLINICAL DATA:  Respiratory failure.  Endotracheal tube present. EXAM: PORTABLE CHEST 1 VIEW COMPARISON:  10/27/2016 FINDINGS:  Endotracheal tube terminates 3.3 cm above carina.Nasogastric extends beyond the inferior aspect of the film. Right internal jugular line tip at mid SVC. Midline trachea. Cardiomegaly accentuated by AP portable technique. Atherosclerosis in the transverse aorta. Probable small bilateral pleural effusions. Mild pulmonary venous congestion. Persistent bibasilar airspace disease. IMPRESSION: Given differences in technique, similar cardiomegaly with mild pulmonary venous congestion and small bilateral pleural effusions. Electronically Signed   By: Abigail Miyamoto M.D.   On: 10/29/2016 07:10   Medications: Infusions: . sodium chloride Stopped (10/27/16 1515)  . sodium chloride    . sodium chloride 100 mL/hr at 10/29/16 0600  . cisatracurium (NIMBEX) infusion 3 mcg/kg/min (10/29/16 0606)  . epinephrine 10 mcg/min (10/29/16 0824)  . fentaNYL infusion INTRAVENOUS 300 mcg/hr (10/29/16 0500)  . fentaNYL infusion INTRAVENOUS 300 mcg/hr (10/29/16 0600)  . furosemide Stopped  (10/29/16 0705)  . glucagon (GLUCAGEN) infusion (for beta blocker/calcium channel blocker overdose) 1 mg/hr (10/29/16 0703)  . midazolam (VERSED) infusion 3 mg/hr (10/29/16 0600)  . norepinephrine (LEVOPHED) Adult infusion 2.027 mcg/min (10/29/16 0600)  . piperacillin-tazobactam (ZOSYN)  IV Stopped (10/29/16 0530)    Scheduled Medications: . artificial tears  1 application Both Eyes Q6V  . chlorhexidine gluconate (MEDLINE KIT)  15 mL Mouth Rinse BID  . Chlorhexidine Gluconate Cloth  6 each Topical Daily  . heparin  5,000 Units Subcutaneous Q8H  . insulin aspart  0-15 Units Subcutaneous Q4H  . mouth rinse  15 mL Mouth Rinse QID  . pantoprazole (PROTONIX) IV  40 mg Intravenous QHS    have reviewed scheduled and prn medications.  Physical Exam: General: sedated on vent Heart: RRR Lungs: on vent- CBS Abdomen: obese, soft, non tender Extremities: pitting edema Dialysis Access: none yet     10/29/2016,8:32 AM  LOS: 2 days

## 2016-10-29 NOTE — Progress Notes (Signed)
Pharmacy Note  Omar Lambert is a 76 y.o. male admitted on 10/27/2016.  Pharmacy has been consulted to evaluate medication dosing for CRRT.  Plan: Adjusted zosyn 2.25 g to every 6 hours while on CRRT.  Height: 5\' 8"  (172.7 cm) Weight: 229 lb 4.5 oz (104 kg) IBW/kg (Calculated) : 68.4  Temp (24hrs), Avg:98 F (36.7 C), Min:97.5 F (36.4 C), Max:99.7 F (37.6 C)   Recent Labs Lab 10/27/16 0025 10/27/16 0040 10/27/16 0400 10/27/16 0816 10/27/16 1528 10/27/16 1715 10/27/16 1730 10/27/16 2318 10/28/16 0418 10/29/16 0450  WBC 11.2*  --  18.3*  --  15.2*  --  16.6*  --  17.5* 11.1*  CREATININE 3.96*  --  4.23*  --  4.71*  --   --   --  5.64* 6.50*  LATICACIDVEN  --  9.63* 11.4*  --   --  2.9*  --  2.3*  --   --   VANCORANDOM  --   --   --  15  --   --   --   --   --   --     Estimated Creatinine Clearance: 11.1 mL/min (A) (by C-G formula based on SCr of 6.5 mg/dL (H)).    No Known Allergies  Thank you for allowing pharmacy to be a part of this patient's care.  Betsey Holiday 10/29/2016 3:37 PM

## 2016-10-29 NOTE — Progress Notes (Signed)
Initial Nutrition Assessment  DOCUMENTATION CODES:   Obesity unspecified  INTERVENTION:   Once hemodynamically stable, if TF desired/required: Vital High Protein @ 40 ml/hr 60 ml Prostat BID Provides: 1360 kcals, 144 grams protein, 802 ml free water.   NUTRITION DIAGNOSIS:   Inadequate oral intake related to inability to eat as evidenced by NPO status.  GOAL:   Provide needs based on ASPEN/SCCM guidelines  MONITOR:   Diet advancement, Vent status, Weight trends, Labs, TF tolerance  REASON FOR ASSESSMENT:   Ventilator   ASSESSMENT:   Pt with PMH significant for CHF, CKD stage IV (seen by nephrologist this week, discussing possible dialysis), thyroid disease, and DM. Presents this admission after being poorly responsive without arrest s/p opthalmic surgery (9/21). Pt shows to have worsening renal function at this time, may possibly require CRRT.   Spoke with wife at bedside to obtain nutrition history.  Pt had no loss in appetite or unintentional wt loss. Records limited in wt history.   Patient is currently intubated on ventilator support MV: 19.6 L/min Temp (24hrs), Avg:97.9 F (36.6 C), Min:97.5 F (36.4 C), Max:98.6 F (37 C) Propofol: none BP: 120/53 MAP: 67  Nutrition-Focused physical exam completed. Findings are no fat depletion, no muscle depletion, and generalized mild edema.   Medications reviewed and include: SSI, NS @ 100 ml/hr, fentanyl, lasix, glucagon, versed, levophed, IV abx Labs reviewed: Na 133 (L) BUN 79 (H) Creatinine 6.50 (H)  Diet Order:  Diet NPO time specified  Skin:   (right open wound foot)  Last BM:  10/29/16  Height:   Ht Readings from Last 1 Encounters:  10/28/16 5\' 8"  (1.727 m)    Weight:   Wt Readings from Last 1 Encounters:  10/29/16 229 lb 4.5 oz (104 kg)    Ideal Body Weight:  70 kg  BMI:  Body mass index is 34.86 kg/m.  Estimated Nutritional Needs:   Kcal:  2542-7062 (11-14 kcal/kg)  Protein:  140 (2 g/kg  IBW)  Fluid:  >1.1 L/day  EDUCATION NEEDS:   No education needs identified at this time  [place], LDN Clinical Nutrition Pager # - [phone]

## 2016-10-29 NOTE — Progress Notes (Signed)
NP [facility] notified of patients procalcitonin level of 93.09. Patient is currently receiving antibiotics. No new orders at this time. Will continue to monitor.

## 2016-10-29 NOTE — Progress Notes (Signed)
Progress Note  Patient Name: Omar Lambert Date of Encounter: 10/29/2016  Primary Cardiologist: Dr. Claiborne Billings   Subjective   Unable to obtain. Intubated and sedated.   Inpatient Medications    Scheduled Meds: . artificial tears  1 application Both Eyes R9F  . chlorhexidine gluconate (MEDLINE KIT)  15 mL Mouth Rinse BID  . Chlorhexidine Gluconate Cloth  6 each Topical Daily  . heparin  5,000 Units Subcutaneous Q8H  . insulin aspart  0-15 Units Subcutaneous Q4H  . mouth rinse  15 mL Mouth Rinse QID  . pantoprazole (PROTONIX) IV  40 mg Intravenous QHS   Continuous Infusions: . sodium chloride Stopped (10/27/16 1515)  . sodium chloride    . sodium chloride 100 mL/hr at 10/29/16 0600  . cisatracurium (NIMBEX) infusion 3 mcg/kg/min (10/29/16 0606)  . epinephrine 10 mcg/min (10/29/16 0600)  . fentaNYL infusion INTRAVENOUS 300 mcg/hr (10/29/16 0500)  . fentaNYL infusion INTRAVENOUS 300 mcg/hr (10/29/16 0600)  . furosemide 160 mg (10/29/16 0605)  . glucagon (GLUCAGEN) infusion (for beta blocker/calcium channel blocker overdose) 1 mg/hr (10/29/16 0703)  . midazolam (VERSED) infusion 3 mg/hr (10/29/16 0600)  . norepinephrine (LEVOPHED) Adult infusion 2.027 mcg/min (10/29/16 0600)  . piperacillin-tazobactam (ZOSYN)  IV Stopped (10/29/16 0530)   PRN Meds: sodium chloride, sodium chloride, fentaNYL, fentaNYL, midazolam, midazolam, midazolam   Vital Signs    Vitals:   10/29/16 0545 10/29/16 0600 10/29/16 0615 10/29/16 0700  BP:      Pulse: 77 76 77 78  Resp: (!) 35 (!) 35 (!) 35 (!) 35  Temp: 97.7 F (36.5 C) 97.7 F (36.5 C) 97.7 F (36.5 C) 97.7 F (36.5 C)  TempSrc:      SpO2: 100% 100% 100% 100%  Weight:      Height:        Intake/Output Summary (Last 24 hours) at 10/29/16 0736 Last data filed at 10/29/16 0700  Gross per 24 hour  Intake          4244.62 ml  Output             2002 ml  Net          2242.62 ml   Filed Weights   10/27/16 0023 10/28/16 0500 10/29/16  0455  Weight: 100 kg (220 lb 7.4 oz) 106.9 kg (235 lb 10.8 oz) 104 kg (229 lb 4.5 oz)    Telemetry    Sinus rhythm.  Prolonged first degree heart block.  - Personally Reviewed  ECG    10/28/11: Atrial fibrillation.  Rate 69 bpm. - Personally Reviewed  Physical Exam   VS:  BP 123/72   Pulse 78   Temp 97.7 F (36.5 C)   Resp (!) 35   Ht '5\' 8"'  (1.727 m)   Wt 104 kg (229 lb 4.5 oz)   SpO2 100%   BMI 34.86 kg/m  , BMI Body mass index is 34.86 kg/m. GENERAL:  Critically ill-appearing.  No acute distress. HEENT: Pupils equal round and reactive, fundi not visualized, oral mucosa unremarkable NECK:  Unable to assess JVD 2/2 lines LUNGS:  Vented breath sounds on anterior exam HEART:  RRR.  PMI not displaced or sustained,S1 and S2 within normal limits, no S3, no S4, no clicks, no rubs, no murmurs ABD:  Soft, positive bowel sounds normal in frequency in pitch, no bruits, no rebound, no guarding, no midline pulsatile mass, no hepatomegaly, no splenomegaly EXT: no edema, no cyanosis no clubbing SKIN:  No rashes no nodules NEURO:  Cranial nerves II through XII grossly intact, motor grossly intact throughout [facility]:  Cognitively intact, oriented to person place and time   Labs    Chemistry Recent Labs Lab 10/27/16 0025  10/27/16 1528 10/28/16 0418 10/29/16 0450  NA 142  --  137 137 133*  K 4.9  --  3.7 4.4 4.0  CL 100*  --  102 102 98*  CO2 22  --  24 21* 21*  GLUCOSE 100*  --  181* 132* 124*  BUN 44*  --  59* 70* 79*  CREATININE 3.96*  < > 4.71* 5.64* 6.50*  CALCIUM 9.7  --  8.6* 8.2* 8.0*  PROT 6.9  --   --   --   --   ALBUMIN 3.4*  --   --   --   --   AST 361*  --   --   --   --   ALT 290*  --   --   --   --   ALKPHOS 169*  --   --   --   --   BILITOT 1.9*  --   --   --   --   GFRNONAA 13*  < > 11* 9* 7*  GFRAA 15*  < > 13* 10* 8*  ANIONGAP 20*  --  '11 14 14  ' < > = values in this interval not displayed.   Hematology  Recent Labs Lab 10/27/16 1730 10/28/16 0418  10/29/16 0450  WBC 16.6* 17.5* 11.1*  RBC 3.65* 3.68* 3.71*  HGB 11.3* 11.3* 11.4*  HCT 35.6* 34.9* 33.6*  MCV 97.5 94.8 90.6  MCH 31.0 30.7 30.7  MCHC 31.7 32.4 33.9  RDW 15.7* 15.6* 15.3  PLT 124* 104* 116*    Cardiac Enzymes  Recent Labs Lab 10/28/16 1141  TROPONINI 2.34*     Recent Labs Lab 10/27/16 0038  TROPIPOC 0.15*     BNPNo results for input(s): BNP, PROBNP in the last 168 hours.   DDimer No results for input(s): DDIMER in the last 168 hours.   Radiology    Dg Chest Port 1 View  Result Date: 10/29/2016 CLINICAL DATA:  Respiratory failure.  Endotracheal tube present. EXAM: PORTABLE CHEST 1 VIEW COMPARISON:  10/27/2016 FINDINGS: Endotracheal tube terminates 3.3 cm above carina.Nasogastric extends beyond the inferior aspect of the film. Right internal jugular line tip at mid SVC. Midline trachea. Cardiomegaly accentuated by AP portable technique. Atherosclerosis in the transverse aorta. Probable small bilateral pleural effusions. Mild pulmonary venous congestion. Persistent bibasilar airspace disease. IMPRESSION: Given differences in technique, similar cardiomegaly with mild pulmonary venous congestion and small bilateral pleural effusions. Electronically Signed   By: Abigail Miyamoto M.D.   On: 10/29/2016 07:10    Cardiac Studies   Echo 10/27/16: Study Conclusions  - Left ventricle: The cavity size was normal. Wall thickness was   increased in a pattern of moderate LVH. Systolic function was   vigorous. The estimated ejection fraction was in the range of 65%   to 70%. Wall motion was normal; there were no regional wall   motion abnormalities. - Ventricular septum: The contour showed diastolic flattening. - Right ventricle: The cavity size was moderately dilated. - Right atrium: The atrium was moderately dilated. - Tricuspid valve: There was severe regurgitation. - Pulmonary arteries: Systolic pressure was mildly to moderately   increased. PA peak pressure: 38  mm Hg (S).  Lexiscan Myoview 10/23/2016  There was no ST segment deviation noted during stress.  The study is  normal.  This is a low risk study.  The left ventricular ejection fraction is mildly decreased (45-54%). Nuclear stress EF: 47%.   Patient Profile     76 y.o. male with paroxysmal atrial flutter, hypertension, OSA on CPAP, DM, CKD IV, hypothyroidism, and severe COPD here with symptomatic bradycardia and cardiogenic shock.  He became bradycardic and unresponsive but reportedly never lost a pulse.  He was intubated and given atropine and epinephrine.   Assessment & Plan    # Atrial fibrillation/flutter: # Symptomatic bradycardia: Mr. Stegman was on amiodarone as an outpatient but no other nodal agents.  He is no longer bradycardic on glucagon and epinephrine. Continue to wean norepinephrine.  His lactate was elevated but there were no other profound metabolic derangements that would explain his bradycardia on presentation.   # Acute on chronic diastolic heart failure: # Cardiogenic shock: Weaning NE to 2 today.  Epi still at 10, both for HR and BP.  Volume control likely with CVVHD/HD.  # Elevated troponin:  Troponin elevated to 0.15-->2.34.  Patient had a recent negative stress test in the setting of feeing fatigued.  No chest pain.  This is likely 2/2 shock and acute on chronic renal failure.   # Acute on chronic renal failure IV:  Responded to IV lasix with 2L UOP.  However, he is still net +2L and renal function worsening.  Suspect he will get HD/CVVHD today.  # Fever:  # Leukocytosis:  Improving with antibiotics.  No secretions found on bronch.  Abdomen no longer tense or distended.  Time spent: 50 minutes-Greater than 50% of this time was spent in counseling, explanation of diagnosis, planning of further management, and coordination of care.    For questions or updates, please contact [place] Please consult [email or web address] for contact info under  Cardiology/STEMI.      Signed, Skeet Latch, MD  10/29/2016, 7:36 AM

## 2016-10-30 ENCOUNTER — Inpatient Hospital Stay (HOSPITAL_COMMUNITY): Payer: Medicare HMO

## 2016-10-30 LAB — RENAL FUNCTION PANEL
ALBUMIN: 2.5 g/dL — AB (ref 3.5–5.0)
Albumin: 2.5 g/dL — ABNORMAL LOW (ref 3.5–5.0)
Anion gap: 12 (ref 5–15)
Anion gap: 11 (ref 5–15)
BUN: 58 mg/dL — AB (ref 6–20)
BUN: 44 mg/dL — AB (ref 6–20)
CHLORIDE: 98 mmol/L — AB (ref 101–111)
CO2: 23 mmol/L (ref 22–32)
CO2: 22 mmol/L (ref 22–32)
Calcium: 8.1 mg/dL — ABNORMAL LOW (ref 8.9–10.3)
Calcium: 8.2 mg/dL — ABNORMAL LOW (ref 8.9–10.3)
Chloride: 101 mmol/L (ref 101–111)
Creatinine, Ser: 4.87 mg/dL — ABNORMAL HIGH (ref 0.61–1.24)
Creatinine, Ser: 3.98 mg/dL — ABNORMAL HIGH (ref 0.61–1.24)
GFR calc Af Amer: 12 mL/min — ABNORMAL LOW (ref >60)
GFR calc Af Amer: 15 mL/min — ABNORMAL LOW (ref >60)
GFR, EST NON AFRICAN AMERICAN: 10 mL/min — AB (ref >60)
GFR, EST NON AFRICAN AMERICAN: 13 mL/min — AB (ref >60)
GLUCOSE: 110 mg/dL — AB (ref 65–99)
Glucose, Bld: 71 mg/dL (ref 65–99)
PHOSPHORUS: 3 mg/dL (ref 2.5–4.6)
POTASSIUM: 4.2 mmol/L (ref 3.5–5.1)
POTASSIUM: 4.3 mmol/L (ref 3.5–5.1)
Phosphorus: 3.7 mg/dL (ref 2.5–4.6)
Sodium: 133 mmol/L — ABNORMAL LOW (ref 135–145)
Sodium: 134 mmol/L — ABNORMAL LOW (ref 135–145)

## 2016-10-30 LAB — GLUCOSE, CAPILLARY
GLUCOSE-CAPILLARY: 120 mg/dL — AB (ref 65–99)
GLUCOSE-CAPILLARY: 129 mg/dL — AB (ref 65–99)
GLUCOSE-CAPILLARY: 70 mg/dL (ref 65–99)
GLUCOSE-CAPILLARY: 70 mg/dL (ref 65–99)
GLUCOSE-CAPILLARY: 93 mg/dL (ref 65–99)
Glucose-Capillary: 105 mg/dL — ABNORMAL HIGH (ref 65–99)

## 2016-10-30 LAB — CBC
HEMATOCRIT: 34.5 % — AB (ref 39.0–52.0)
Hemoglobin: 11.8 g/dL — ABNORMAL LOW (ref 13.0–17.0)
MCH: 30.9 pg (ref 26.0–34.0)
MCHC: 34.2 g/dL (ref 30.0–36.0)
MCV: 90.3 fL (ref 78.0–100.0)
Platelets: 130 10*3/uL — ABNORMAL LOW (ref 150–400)
RBC: 3.82 MIL/uL — ABNORMAL LOW (ref 4.22–5.81)
RDW: 15.2 % (ref 11.5–15.5)
WBC: 8 10*3/uL (ref 4.0–10.5)

## 2016-10-30 LAB — MAGNESIUM: MAGNESIUM: 2.1 mg/dL (ref 1.7–2.4)

## 2016-10-30 LAB — PROCALCITONIN: Procalcitonin: 61.68 ng/mL

## 2016-10-30 MED ORDER — SODIUM CHLORIDE 0.9% FLUSH
10.0000 mL | INTRAVENOUS | Status: DC | PRN
  Administered 2016-11-08: 20 mL
  Filled 2016-10-30: qty 40

## 2016-10-30 MED ORDER — MUPIROCIN 2 % EX OINT
1.0000 "application " | TOPICAL_OINTMENT | Freq: Two times a day (BID) | CUTANEOUS | Status: AC
  Administered 2016-10-30 – 2016-11-03 (×10): 1 via NASAL
  Filled 2016-10-30 (×2): qty 22

## 2016-10-30 MED ORDER — PRO-STAT SUGAR FREE PO LIQD
60.0000 mL | Freq: Two times a day (BID) | ORAL | Status: DC
  Administered 2016-10-30 – 2016-11-05 (×14): 60 mL
  Filled 2016-10-30 (×14): qty 60

## 2016-10-30 MED ORDER — CHLORHEXIDINE GLUCONATE CLOTH 2 % EX PADS: 6.0000 | MEDICATED_PAD | Freq: Every day | CUTANEOUS | Status: DC

## 2016-10-30 MED ORDER — VITAL HIGH PROTEIN PO LIQD
1000.0000 mL | ORAL | Status: DC
Start: 2016-10-30 — End: 2016-11-01
  Administered 2016-10-30: 16:00:00
  Administered 2016-10-30: 1000 mL
  Administered 2016-10-30 – 2016-10-31 (×4)
  Administered 2016-10-31: 1000 mL
  Administered 2016-10-31 – 2016-11-01 (×9)

## 2016-10-30 MED ORDER — SODIUM CHLORIDE 0.9% FLUSH
10.0000 mL | Freq: Two times a day (BID) | INTRAVENOUS | Status: DC
  Administered 2016-10-30 – 2016-11-01 (×4): 10 mL
  Administered 2016-11-02 (×2): 40 mL
  Administered 2016-11-03 – 2016-11-09 (×11): 10 mL

## 2016-10-30 MED ORDER — CHLORHEXIDINE GLUCONATE CLOTH 2 % EX PADS
6.0000 | MEDICATED_PAD | Freq: Every day | CUTANEOUS | Status: DC
  Administered 2016-10-30: 6 via TOPICAL

## 2016-10-30 MED ORDER — CHLORHEXIDINE GLUCONATE CLOTH 2 % EX PADS
6.0000 | MEDICATED_PAD | Freq: Every day | CUTANEOUS | Status: DC
  Administered 2016-10-31: 6 via TOPICAL

## 2016-10-30 MED ORDER — ORAL CARE MOUTH RINSE: 15.0000 mL | Freq: Four times a day (QID) | OROMUCOSAL | Status: DC

## 2016-10-30 NOTE — Progress Notes (Signed)
Nutrition Follow-up  DOCUMENTATION CODES:   Obesity unspecified  INTERVENTION:   Vital High Protein @ 40 ml/hr 60 ml Prostat BID Provides: 1360 kcals, 144 grams protein, 802 ml free water.    NUTRITION DIAGNOSIS:   Inadequate oral intake related to inability to eat as evidenced by NPO status. Ongoing.   GOAL:   Provide needs based on ASPEN/SCCM guidelines Progressing.   MONITOR:   Diet advancement, Vent status, Weight trends, Labs, TF tolerance  REASON FOR ASSESSMENT:   Consult Enteral/tube feeding initiation and management  ASSESSMENT:   Pt with PMH significant for CHF, CKD stage IV (seen by nephrologist this week, discussing possible dialysis), thyroid disease, and DM. Presents this admission with being poorly responsive after opthalmic surgery (9/21). Pt renal function worsening at this time, may possibly need CRRT.   Pt with severe sepsis started CRRT 9/24, Nimbex d/c'ed today Per nephrology, plans as OP to eventually start HD UOP: 4710 ml x 24 hrs  MAP 76 Patient remains intubated on ventilator support Medications reviewed and include: novolog 0-15 units every 4 hours, levophed @ 12 mcg/min Labs reviewed: Na 133 (L), magnesium/PO4/K+ WNL   Diet Order:  Diet NPO time specified  Skin:   (right open wound foot)  Last BM:  10/29/16  Height:   Ht Readings from Last 1 Encounters:  10/28/16 5\' 8"  (1.727 m)    Weight:   Wt Readings from Last 1 Encounters:  10/30/16 230 lb 2.6 oz (104.4 kg)    Ideal Body Weight:  70 kg  BMI:  Body mass index is 35 kg/m.  Estimated Nutritional Needs:   Kcal:  1144-1456 (11-14 kcal/kg)  Protein:  140 (2 g/kg IBW)  Fluid:  >1.1 L/day  EDUCATION NEEDS:   No education needs identified at this time  [place], [place], [place] Pager [phone] After Hours Pager

## 2016-10-30 NOTE — Progress Notes (Signed)
Subjective:  Remains in ICU on epi and norepi but doses coming down - great UOP 4700 last 24 hours on lasix.  Decision made yesterday to start CRRT as he was heading that way as OP and daughter saying that pt did not object to dialysis when discussed from Dr. Jimmy Footman so she thought he would want  Objective Vital signs in last 24 hours: Vitals:   10/30/16 0500 10/30/16 0520 10/30/16 0600 10/30/16 0700  BP: 129/80  124/72 109/70  Pulse: 77  74 78  Resp: (!) 35  (!) 35 (!) 35  Temp: (!) 97 F (36.1 C) (!) 96.6 F (35.9 C) (!) 96.3 F (35.7 C) (!) 96.4 F (35.8 C)  TempSrc:      SpO2: 100%  100% 99%  Weight:      Height:       Weight change: 0.4 kg (14.1 oz)  Intake/Output Summary (Last 24 hours) at 10/30/16 0754 Last data filed at 10/30/16 0700  Gross per 24 hour  Intake           2856.5 ml  Output             6484 ml  Net          -3627.5 ml    Assessment/ Plan: Pt is a 76 y.o. yo male who was admitted on 10/27/2016 with being poorly responsive after eye surgery- has advanced CKD at baseline- now with A on CRF  Assessment/Plan: 1. Renal- non oliguric A on CRF- apparently plans starting to be made for dialysis as OP- pt uremic and in the setting of being critically ill even though no absolute indications I felt it best to initiate dialysis in order for him to overall imporve.  Daughter agreed, s/p vascath and started CRRT late 9/25. Labs and volume status improved, to continue CRRT.  Elytes stable   2. HTN/vol- is overloaded by exam- responding to lasix and CRRT- response may be too brisk, lasix now stopped - CVP mostly 8-11- will set UF at 50 ccs per hour for now 3. Anemia- hgb over 11- no action needed yet     Omar Lambert A    Labs: Basic Metabolic Panel:  Recent Labs Lab 10/29/16 0450 10/29/16 1608 10/30/16 0412  NA 133* 133* 133*  K 4.0 4.4 4.2  CL 98* 98* 98*  CO2 21* 21* 23  GLUCOSE 124* 99 110*  BUN 79* 83* 58*  CREATININE 6.50* 6.98* 4.87*   CALCIUM 8.0* 8.0* 8.1*  PHOS 4.5 5.4* 3.7   Liver Function Tests:  Recent Labs Lab 10/27/16 0025 10/29/16 1608 10/30/16 0412  AST 361*  --   --   ALT 290*  --   --   ALKPHOS 169*  --   --   BILITOT 1.9*  --   --   PROT 6.9  --   --   ALBUMIN 3.4* 2.5* 2.5*   No results for input(s): LIPASE, AMYLASE in the last 168 hours. No results for input(s): AMMONIA in the last 168 hours. CBC:  Recent Labs Lab 10/27/16 0025  10/27/16 1528 10/27/16 1730 10/28/16 0418 10/29/16 0450 10/30/16 0412  WBC 11.2*  < > 15.2* 16.6* 17.5* 11.1* 8.0  NEUTROABS 9.0*  --   --  14.8*  --   --   --   HGB 11.7*  < > 11.4* 11.3* 11.3* 11.4* 11.8*  HCT 40.9  < > 35.7* 35.6* 34.9* 33.6* 34.5*  MCV 109.1*  < > 99.2 97.5 94.8 90.6 90.3  PLT 145*  < > 113* 124* 104* 116* 130*  < > = values in this interval not displayed. Cardiac Enzymes:  Recent Labs Lab 10/28/16 1141  TROPONINI 2.34*   CBG:  Recent Labs Lab 10/29/16 0817 10/29/16 1153 10/29/16 1603 10/29/16 2010 10/30/16 0001  GLUCAP 130* 134* 92 105* 129*    Iron Studies: No results for input(s): IRON, TIBC, TRANSFERRIN, FERRITIN in the last 72 hours. Studies/Results: Dg Chest Portable 1 View  Result Date: 10/30/2016 CLINICAL DATA:  Respiratory failure. EXAM: PORTABLE CHEST 1 VIEW COMPARISON:  Radiograph of October 29, 2016. FINDINGS: Stable cardiomegaly. Atherosclerosis of thoracic aorta is noted. Endotracheal and nasogastric tubes are unchanged in position. Right internal jugular catheter is unchanged in position. Stable bibasilar atelectasis or edema is noted with mild associated pleural effusions. Bony thorax is unremarkable. IMPRESSION: Aortic atherosclerosis. Stable support apparatus. Stable bibasilar atelectasis or edema is noted with mild associated pleural effusions. Electronically Signed   By: Marijo Conception, M.D.   On: 10/30/2016 07:28   Dg Chest Port 1 View  Result Date: 10/29/2016 CLINICAL DATA:  Attempted placement of  left internal jugular venous catheter with inability to advance the catheter. EXAM: PORTABLE CHEST 1 VIEW COMPARISON:  Portable chest x-ray of earlier today. FINDINGS: The lungs are well-expanded. There is no postprocedure pneumothorax. There remain increased lung markings at both bases likely reflecting pleural fluid layering posteriorly. The cardiac silhouette remains mildly enlarged. The central pulmonary vascularity is mildly prominent. There is calcification in the wall of the aortic arch. The endotracheal tube tip measures approximately 5.6 cm above the carina. The esophagogastric tube tip projects below the inferior margin of the image. The right internal jugular venous catheter tip projects over the proximal SVC. IMPRESSION: No postprocedure pneumothorax or hemothorax following left internal jugular catheter placement attempt. Stable underlying mild CHF. Bilateral pleural effusions layering posteriorly. Thoracic aortic atherosclerosis. The support tubes are in stable position. Electronically Signed   By: David  Martinique M.D.   On: 10/29/2016 15:32   Dg Chest Port 1 View  Result Date: 10/29/2016 CLINICAL DATA:  Respiratory failure.  Endotracheal tube present. EXAM: PORTABLE CHEST 1 VIEW COMPARISON:  10/27/2016 FINDINGS: Endotracheal tube terminates 3.3 cm above carina.Nasogastric extends beyond the inferior aspect of the film. Right internal jugular line tip at mid SVC. Midline trachea. Cardiomegaly accentuated by AP portable technique. Atherosclerosis in the transverse aorta. Probable small bilateral pleural effusions. Mild pulmonary venous congestion. Persistent bibasilar airspace disease. IMPRESSION: Given differences in technique, similar cardiomegaly with mild pulmonary venous congestion and small bilateral pleural effusions. Electronically Signed   By: Abigail Miyamoto M.D.   On: 10/29/2016 07:10   Medications: Infusions: . sodium chloride Stopped (10/27/16 1515)  . sodium chloride    . sodium  chloride Stopped (10/29/16 2021)  . cisatracurium (NIMBEX) infusion 5 mcg/kg/min (10/30/16 0111)  . epinephrine 9 mcg/min (10/30/16 0435)  . fentaNYL infusion INTRAVENOUS 300 mcg/hr (10/30/16 0437)  . heparin    . midazolam (VERSED) infusion 4 mg/hr (10/30/16 0438)  . norepinephrine (LEVOPHED) Adult infusion Stopped (10/30/16 0509)  . piperacillin-tazobactam (ZOSYN)  IV Stopped (10/30/16 0537)  . dialysis replacement fluid (prismasate) 300 mL/hr at 10/29/16 1705  . dialysis replacement fluid (prismasate) 300 mL/hr at 10/29/16 1705  . dialysate (PRISMASATE) 1,500 mL/hr at 10/30/16 0727    Scheduled Medications: . artificial tears  1 application Both Eyes S2G  . chlorhexidine gluconate (MEDLINE KIT)  15 mL Mouth Rinse BID  . Chlorhexidine Gluconate Cloth  6 each Topical  Daily  . heparin  5,000 Units Subcutaneous Q8H  . insulin aspart  0-15 Units Subcutaneous Q4H  . mouth rinse  15 mL Mouth Rinse 10 times per day  . pantoprazole (PROTONIX) IV  40 mg Intravenous QHS  . sodium chloride flush  10-40 mL Intracatheter Q12H    have reviewed scheduled and prn medications.  Physical Exam: General: sedated on vent Heart: RRR Lungs: on vent- CBS Abdomen: obese, soft, non tender Extremities: pitting edema but better  Dialysis Access: vascath placed 9/24    10/30/2016,7:54 AM  LOS: 3 days

## 2016-10-30 NOTE — Progress Notes (Signed)
Name: Omar Lambert MRN: [id number] DOB: 06/26/1940    ADMISSION DATE:  10/27/2016 CONSULTATION DATE: 10/27/16  REFERRING MD : ER ATTENDING DELO  CHIEF COMPLAINT:  BRADYCARDIA, UNRESPONSIVE  BRIEF PATIENT DESCRIPTION: 76 YR OLD MALE W/ PMHX CHF, CKD STAGE III/IV ( seen by Nephro this week and discussing possible dialysis), Afib RVR, A flutter, DM presents after opthalmic surgery on 9/21 AM. Post surgery wife found him unresponsive at home at midnight. Bradycardic and unresponsive never lost pulse. Intubated, given atropine, started on Epinephrine, BP 18A systolic highest Hr in 41Y. PCCM consulted   EKG - atrial fibrillation with slow ventricular response with RBBB low QRS voltage  ECHO Old-- 08/18/2015>> - Left ventricle: The cavity size was normal. There was moderate concentric hypertrophy. Systolic function was normal. The estimated ejection fraction was in the range of 60% to 65%. Wall motion was normal; there were no regional wall motion abnormalities. - Left atrium: The atrium was moderately dilated. - Right ventricle: The cavity size was mildly dilated. Wall thickness was increased. - Right atrium: The atrium was moderately dilated. - Tricuspid valve: There was moderate regurgitation. - Pulmonary arteries: Systolic pressure was mildly increased. PA peak pressure: 40 mm Hg (S).  Stress test nuclear lexiscan 10/23/2016  There was no ST segment deviation noted during stress.  The study is normal.  This is a low risk study.  The left ventricular ejection fraction is mildly decreased (45-54%).  Nuclear stress EF: 47%.   STUDIES:  CT HEAD 9/22>>> Negative noncontrast CT HEAD for age. Nasogastric tube terminates in pharynx.  Recommend advancement.   SIGNIFICANT EVENTS  ETT 9/23>> RIGHT IJ CVC 9/23>> A-LINE 9/23 >> 9/24 - Remains paralyzed, sedated.  Remains on epi gtt r/t bradycardia, levophed 3mcg.    SUBJECTIVE/OVERNIGHT/INTERVAL HX  9/25 -  remains on nimbex, On levophed 61mcg. On epi 9, Fio2 50%, peep 10 -> BIS high 20s, (on fent 300 and versd 4), pulse ox 100%. Per RN sensitive to weaning off levophed. Started CRRT yesterday. Fever improved and wbc improved. PCT 60s suggests severe sepsis   VITAL SIGNS: Temp:  [96.3 F (35.7 C)-99.7 F (37.6 C)] 97.2 F (36.2 C) (09/25 0800) Pulse Rate:  [58-91] 81 (09/25 0812) Resp:  [30-35] 30 (09/25 0800) BP: (98-143)/(50-80) 108/51 (09/25 0812) SpO2:  [96 %-100 %] 96 % (09/25 0800) Arterial Line BP: (98-171)/(42-75) 107/51 (09/25 0800) FiO2 (%):  [50 %-60 %] 50 % (09/25 0813) Weight:  [104.4 kg (230 lb 2.6 oz)] 104.4 kg (230 lb 2.6 oz) (09/25 0400)  PHYSICAL EXAMINATION:  General Appearance:    Looks criticall il  Head:    Normocephalic, without obvious abnormality, atraumatic  Eyes:    PERRL - Rt eye bandage +, conjunctiva/corneas - mudy on left      Ears:    Normal external ear canals, both ears  Nose:   NG tube - no  Throat:  ETT TUBE - yyes , OG tube - yes  Neck:   Supple,  No enlargement/tenderness/nodules     Lungs:     Clear to auscultation bilaterally, Ventilator   Synchrony - yes   Chest wall:    No deformity  Heart:    S1 and S2 normal, no murmur, CVP - na.  Pressors - x 2 +  Abdomen:     Soft, no masses, no organomegaly  Genitalia:    Not done  Rectal:   not done  Extremities:   Extremities- intact     Skin:  Intact in exposed areas . Sacral area - no reported decub     Neurologic:   Sedation - fent, versed, nimbex -> RASS - 5/bis 27-41      PULMONARY  Recent Labs Lab 10/27/16 1852 10/28/16 0431 10/28/16 0957 10/28/16 1042 10/28/16 1213  PHART 7.482* 7.457* 7.243* 7.205* 7.483*  PCO2ART 33.7 31.8* 56.7* 56.7* 29.4*  PO2ART 80.0* 102.0 108.0 78.0* 224.0*  HCO3 25.0 22.2 24.5 22.6 22.1  TCO2 26 23 26 24 23   O2SAT 96.0 98.0 97.0 92.0 100.0    CBC  Recent Labs Lab 10/28/16 0418 10/29/16 0450 10/30/16 0412  HGB 11.3* 11.4* 11.8*  HCT 34.9*  33.6* 34.5*  WBC 17.5* 11.1* 8.0  PLT 104* 116* 130*    COAGULATION  Recent Labs Lab 10/27/16 0025  INR 2.03    CARDIAC   Recent Labs Lab 10/28/16 1141  TROPONINI 2.34*   No results for input(s): PROBNP in the last 168 hours.   CHEMISTRY  Recent Labs Lab 10/27/16 1528 10/28/16 0418 10/29/16 0450 10/29/16 1608 10/30/16 0412  NA 137 137 133* 133* 133*  K 3.7 4.4 4.0 4.4 4.2  CL 102 102 98* 98* 98*  CO2 24 21* 21* 21* 23  GLUCOSE 181* 132* 124* 99 110*  BUN 59* 70* 79* 83* 58*  CREATININE 4.71* 5.64* 6.50* 6.98* 4.87*  CALCIUM 8.6* 8.2* 8.0* 8.0* 8.1*  MG  --   --  1.7  --  2.1  PHOS  --   --  4.5 5.4* 3.7   Estimated Creatinine Clearance: 14.9 mL/min (A) (by C-G formula based on SCr of 4.87 mg/dL (H)).   LIVER  Recent Labs Lab 10/27/16 0025 10/29/16 1608 10/30/16 0412  AST 361*  --   --   ALT 290*  --   --   ALKPHOS 169*  --   --   BILITOT 1.9*  --   --   PROT 6.9  --   --   ALBUMIN 3.4* 2.5* 2.5*  INR 2.03  --   --      INFECTIOUS  Recent Labs Lab 10/27/16 0400 10/27/16 1715 10/27/16 2318 10/29/16 0939 10/30/16 0412  LATICACIDVEN 11.4* 2.9* 2.3*  --   --   PROCALCITON  --   --   --  93.09 61.68     ENDOCRINE CBG (last 3)   Recent Labs  10/29/16 2010 10/30/16 0001 10/30/16 0747  GLUCAP 105* 129* 105*         IMAGING x48h  - image(s) personally visualized  -   highlighted in bold Dg Chest Portable 1 View  Result Date: 10/30/2016 CLINICAL DATA:  Respiratory failure. EXAM: PORTABLE CHEST 1 VIEW COMPARISON:  Radiograph of October 29, 2016. FINDINGS: Stable cardiomegaly. Atherosclerosis of thoracic aorta is noted. Endotracheal and nasogastric tubes are unchanged in position. Right internal jugular catheter is unchanged in position. Stable bibasilar atelectasis or edema is noted with mild associated pleural effusions. Bony thorax is unremarkable. IMPRESSION: Aortic atherosclerosis. Stable support apparatus. Stable bibasilar  atelectasis or edema is noted with mild associated pleural effusions. Electronically Signed   By: Marijo Conception, M.D.   On: 10/30/2016 07:28   Dg Chest Port 1 View  Result Date: 10/29/2016 CLINICAL DATA:  Attempted placement of left internal jugular venous catheter with inability to advance the catheter. EXAM: PORTABLE CHEST 1 VIEW COMPARISON:  Portable chest x-ray of earlier today. FINDINGS: The lungs are well-expanded. There is no postprocedure pneumothorax. There remain increased lung markings at both bases  likely reflecting pleural fluid layering posteriorly. The cardiac silhouette remains mildly enlarged. The central pulmonary vascularity is mildly prominent. There is calcification in the wall of the aortic arch. The endotracheal tube tip measures approximately 5.6 cm above the carina. The esophagogastric tube tip projects below the inferior margin of the image. The right internal jugular venous catheter tip projects over the proximal SVC. IMPRESSION: No postprocedure pneumothorax or hemothorax following left internal jugular catheter placement attempt. Stable underlying mild CHF. Bilateral pleural effusions layering posteriorly. Thoracic aortic atherosclerosis. The support tubes are in stable position. Electronically Signed   By: David  Martinique M.D.   On: 10/29/2016 15:32   Dg Chest Port 1 View  Result Date: 10/29/2016 CLINICAL DATA:  Respiratory failure.  Endotracheal tube present. EXAM: PORTABLE CHEST 1 VIEW COMPARISON:  10/27/2016 FINDINGS: Endotracheal tube terminates 3.3 cm above carina.Nasogastric extends beyond the inferior aspect of the film. Right internal jugular line tip at mid SVC. Midline trachea. Cardiomegaly accentuated by AP portable technique. Atherosclerosis in the transverse aorta. Probable small bilateral pleural effusions. Mild pulmonary venous congestion. Persistent bibasilar airspace disease. IMPRESSION: Given differences in technique, similar cardiomegaly with mild pulmonary  venous congestion and small bilateral pleural effusions. Electronically Signed   By: Abigail Miyamoto M.D.   On: 10/29/2016 07:10      ASSESSMENT / PLAN:  PULMONARY A: #current:  Acute resp failure with ? ALI physiology  10/30/2016 -> does not meet sbt criteria  P:   Dc nimbex Full vent support VAP protocol  CARDIOVASCULAR A:  #admitted with bradycardia following timolol eye drop  10/30/2016 -> Still in circulatory shock . 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  10/30/2016 -> on crrt since 10/29/16  P:   Per renal  GASTROINTESTINAL A:   NPO  P:   TF PPI  HEMATOLOGIC  Recent Labs Lab 10/28/16 0418 10/29/16 0450 10/30/16 0412  HGB 11.3* 11.4* 11.8*  HCT 34.9* 33.6* 34.5*  WBC 17.5* 11.1* 8.0  PLT 104* 116* 130*    A:   #RBC: mild anemia critical illness #Platelet mild thrombocytopenia - improving #WBC lekyocytosis - improving  P:  - PRBC for hgb </= 6.9gm%    - exceptions are   -  if ACS susepcted/confirmed then transfuse for hgb </= 8.0gm%,  or    -  active bleeding with hemodynamic instability, then transfuse regardless of hemoglobin value   At at all times try to transfuse 1 unit prbc as possible with exception of active hemorrhage    INFECTIOUS  Recent Labs Lab 10/29/16 0939 10/30/16 0412  PROCALCITON 93.09 61.68    Results for orders placed or performed during the hospital encounter of 10/27/16  Culture, blood (routine x 2)     Status: None (Preliminary result)   Collection Time: 10/27/16  3:35 AM  Result Value  Ref Range Status  Specimen Description BLOOD RIGHT HAND THUMB  Final   Special Requests IN PEDIATRIC BOTTLE Blood Culture adequate volume  Final   Culture NO GROWTH 2 DAYS  Final   Report Status PENDING  Incomplete  Culture, blood (routine x 2)     Status: None (Preliminary result)   Collection Time: 10/27/16  3:52 AM  Result Value Ref Range Status   Specimen Description BLOOD LEFT HAND  Final   Special Requests IN PEDIATRIC BOTTLE Blood Culture adequate volume  Final   Culture NO GROWTH 2 DAYS  Final   Report Status PENDING  Incomplete  Surgical pcr screen     Status: Abnormal   Collection Time: 10/27/16  3:14 PM  Result Value Ref Range Status   MRSA, PCR NEGATIVE NEGATIVE Final   Staphylococcus aureus POSITIVE (A) NEGATIVE Final    Comment: (NOTE) The Xpert SA Assay (FDA approved for NASAL specimens in patients 76 years of age and older), is one component of a comprehensive surveillance program. It is not intended to diagnose infection nor to guide or monitor treatment.     A:   MSSA PCR + and PCT c/w Septic shock No organism id yet also likely aspiration pneumonia P:   Anti-infectives    Start     Dose/Rate Route Frequency Ordered Stop   10/29/16 1800  piperacillin-tazobactam (ZOSYN) IVPB 2.25 g     2.25 g 100 mL/hr over 30 Minutes Intravenous Every 6 hours 10/29/16 1535     10/27/16 1000  piperacillin-tazobactam (ZOSYN) IVPB 3.375 g  Status:  Discontinued     3.375 g 100 mL/hr over 30 Minutes Intravenous Every 12 hours 10/27/16 0306 10/27/16 0320   10/27/16 0400  piperacillin-tazobactam (ZOSYN) IVPB 2.25 g  Status:  Discontinued     2.25 g 100 mL/hr over 30 Minutes Intravenous Every 8 hours 10/27/16 0320 10/29/16 1535   10/27/16 0315  vancomycin (VANCOCIN) IVPB 1000 mg/200 mL premix     1,000 mg 200 mL/hr over 60 Minutes Intravenous  Once 10/27/16 0306 10/27/16 2094       ENDOCRINE A:   At risk hyperglucemia P:   ICU hyperglycemia protocol  NEUROLOGIC A:     #Baseline : Acute encephalopathy noted post admission  #Current: deeply sedated and paralyzed 10/30/2016    10/30/2016 -> BIs 20s P:   RASS goal: -3/-4 while sedated Dc nimbex and monitor   FAMILY  - Updates: 10/30/2016 --> wife updated at length in ICU at bedside. She spends the nights at bedside. Explained slow recovery only anticipated. She expressed full Rx goal  - Inter-disciplinary family meet or Palliative Care meeting due by:  DAy 7. Current LOS is LOS 3 days   DISPO Keep in ICU    The patient is critically ill with multiple organ systems failure and requires high complexity decision making for assessment and support, frequent evaluation and titration of therapies, application of advanced monitoring technologies and extensive interpretation of multiple databases.   Critical Care Time devoted to patient care services described in this note is  30  Minutes. This time reflects time of care of this signee Dr Brand Males. This critical care time does not reflect procedure time, or teaching time or supervisory time of PA/NP/Med student/Med Resident etc but could involve care discussion time    Dr. Brand Males, M.D., [facility].C.P Pulmonary and Critical Care Medicine Staff Physician [facility] Pager: [phone], If no answer or between  15:00h - 7:00h:  call [phone]  10/30/2016 9:00 AM

## 2016-10-30 NOTE — Progress Notes (Signed)
Progress Note  Patient Name: Omar Lambert Date of Encounter: 10/30/2016  Primary Cardiologist: Dr. Claiborne Billings   Subjective   Unable to obtain. Intubated, sedated, and paralyzed.   Inpatient Medications    Scheduled Meds: . artificial tears  1 application Both Eyes E7M  . chlorhexidine gluconate (MEDLINE KIT)  15 mL Mouth Rinse BID  . Chlorhexidine Gluconate Cloth  6 each Topical Daily  . heparin  5,000 Units Subcutaneous Q8H  . insulin aspart  0-15 Units Subcutaneous Q4H  . mouth rinse  15 mL Mouth Rinse 10 times per day  . pantoprazole (PROTONIX) IV  40 mg Intravenous QHS  . sodium chloride flush  10-40 mL Intracatheter Q12H   Continuous Infusions: . sodium chloride Stopped (10/27/16 1515)  . sodium chloride    . sodium chloride Stopped (10/29/16 2021)  . cisatracurium (NIMBEX) infusion 5 mcg/kg/min (10/30/16 0111)  . epinephrine 9 mcg/min (10/30/16 0435)  . fentaNYL infusion INTRAVENOUS 300 mcg/hr (10/30/16 0437)  . heparin    . midazolam (VERSED) infusion 4 mg/hr (10/30/16 0438)  . norepinephrine (LEVOPHED) Adult infusion Stopped (10/30/16 0509)  . piperacillin-tazobactam (ZOSYN)  IV Stopped (10/30/16 0537)  . dialysis replacement fluid (prismasate) 300 mL/hr at 10/29/16 1705  . dialysis replacement fluid (prismasate) 300 mL/hr at 10/29/16 1705  . dialysate (PRISMASATE) 1,500 mL/hr at 10/30/16 0727   PRN Meds: sodium chloride, sodium chloride, fentaNYL, fentaNYL, heparin, heparin, midazolam, midazolam, midazolam, sodium chloride flush   Vital Signs    Vitals:   10/30/16 0500 10/30/16 0520 10/30/16 0600 10/30/16 0700  BP: 129/80  124/72 109/70  Pulse: 77  74 78  Resp: (!) 35  (!) 35 (!) 35  Temp: (!) 97 F (36.1 C) (!) 96.6 F (35.9 C) (!) 96.3 F (35.7 C) (!) 96.4 F (35.8 C)  TempSrc:      SpO2: 100%  100% 99%  Weight:      Height:        Intake/Output Summary (Last 24 hours) at 10/30/16 0744 Last data filed at 10/30/16 0700  Gross per 24 hour  Intake            2856.5 ml  Output             6484 ml  Net          -3627.5 ml   Filed Weights   10/28/16 0500 10/29/16 0455 10/30/16 0400  Weight: 106.9 kg (235 lb 10.8 oz) 104 kg (229 lb 4.5 oz) 104.4 kg (230 lb 2.6 oz)    Telemetry    Sinus rhythm.  Prolonged first degree heart block.  - Personally Reviewed  ECG    10/28/11: Atrial fibrillation.  Rate 69 bpm. - Personally Reviewed  Physical Exam   VS:  BP 109/70   Pulse 78   Temp (!) 96.4 F (35.8 C)   Resp (!) 35   Ht '5\' 8"'  (1.727 m)   Wt 104.4 kg (230 lb 2.6 oz)   SpO2 99%   BMI 35.00 kg/m  , BMI Body mass index is 35 kg/m. GENERAL:  Critically ill-appearing.  No acute distress. NECK:  Unable to assess JVD 2/2 lines LUNGS:  Vented breath sounds on anterior exam HEART:  RRR.  PMI not displaced or sustained,S1 and S2 within normal limits, no S3, no S4, no clicks, no rubs, no murmurs ABD:  Soft, positive bowel sounds normal in frequency in pitch, no bruits, no rebound, no guarding, no midline pulsatile mass, no hepatomegaly, no splenomegaly EXT:  trace edema, no cyanosis no clubbing SKIN:  No rashes no nodules NEURO:  Unable to assess. Paralyzed and sedated PSYCH:  Unable to assess. Paralyzed and sedated   Labs    Chemistry Recent Labs Lab 10/27/16 0025  10/29/16 0450 10/29/16 1608 10/30/16 0412  NA 142  < > 133* 133* 133*  K 4.9  < > 4.0 4.4 4.2  CL 100*  < > 98* 98* 98*  CO2 22  < > 21* 21* 23  GLUCOSE 100*  < > 124* 99 110*  BUN 44*  < > 79* 83* 58*  CREATININE 3.96*  < > 6.50* 6.98* 4.87*  CALCIUM 9.7  < > 8.0* 8.0* 8.1*  PROT 6.9  --   --   --   --   ALBUMIN 3.4*  --   --  2.5* 2.5*  AST 361*  --   --   --   --   ALT 290*  --   --   --   --   ALKPHOS 169*  --   --   --   --   BILITOT 1.9*  --   --   --   --   GFRNONAA 13*  < > 7* 7* 10*  GFRAA 15*  < > 8* 8* 12*  ANIONGAP 20*  < > '14 14 12  ' < > = values in this interval not displayed.   Hematology  Recent Labs Lab 10/28/16 0418 10/29/16 0450  10/30/16 0412  WBC 17.5* 11.1* 8.0  RBC 3.68* 3.71* 3.82*  HGB 11.3* 11.4* 11.8*  HCT 34.9* 33.6* 34.5*  MCV 94.8 90.6 90.3  MCH 30.7 30.7 30.9  MCHC 32.4 33.9 34.2  RDW 15.6* 15.3 15.2  PLT 104* 116* 130*    Cardiac Enzymes  Recent Labs Lab 10/28/16 1141  TROPONINI 2.34*     Recent Labs Lab 10/27/16 0038  TROPIPOC 0.15*     BNPNo results for input(s): BNP, PROBNP in the last 168 hours.   DDimer No results for input(s): DDIMER in the last 168 hours.   Radiology    Dg Chest Portable 1 View  Result Date: 10/30/2016 CLINICAL DATA:  Respiratory failure. EXAM: PORTABLE CHEST 1 VIEW COMPARISON:  Radiograph of October 29, 2016. FINDINGS: Stable cardiomegaly. Atherosclerosis of thoracic aorta is noted. Endotracheal and nasogastric tubes are unchanged in position. Right internal jugular catheter is unchanged in position. Stable bibasilar atelectasis or edema is noted with mild associated pleural effusions. Bony thorax is unremarkable. IMPRESSION: Aortic atherosclerosis. Stable support apparatus. Stable bibasilar atelectasis or edema is noted with mild associated pleural effusions. Electronically Signed   By: Marijo Conception, M.D.   On: 10/30/2016 07:28   Dg Chest Port 1 View  Result Date: 10/29/2016 CLINICAL DATA:  Attempted placement of left internal jugular venous catheter with inability to advance the catheter. EXAM: PORTABLE CHEST 1 VIEW COMPARISON:  Portable chest x-ray of earlier today. FINDINGS: The lungs are well-expanded. There is no postprocedure pneumothorax. There remain increased lung markings at both bases likely reflecting pleural fluid layering posteriorly. The cardiac silhouette remains mildly enlarged. The central pulmonary vascularity is mildly prominent. There is calcification in the wall of the aortic arch. The endotracheal tube tip measures approximately 5.6 cm above the carina. The esophagogastric tube tip projects below the inferior margin of the image. The  right internal jugular venous catheter tip projects over the proximal SVC. IMPRESSION: No postprocedure pneumothorax or hemothorax following left internal jugular catheter placement attempt. Stable underlying  mild CHF. Bilateral pleural effusions layering posteriorly. Thoracic aortic atherosclerosis. The support tubes are in stable position. Electronically Signed   By: David  Martinique M.D.   On: 10/29/2016 15:32   Dg Chest Port 1 View  Result Date: 10/29/2016 CLINICAL DATA:  Respiratory failure.  Endotracheal tube present. EXAM: PORTABLE CHEST 1 VIEW COMPARISON:  10/27/2016 FINDINGS: Endotracheal tube terminates 3.3 cm above carina.Nasogastric extends beyond the inferior aspect of the film. Right internal jugular line tip at mid SVC. Midline trachea. Cardiomegaly accentuated by AP portable technique. Atherosclerosis in the transverse aorta. Probable small bilateral pleural effusions. Mild pulmonary venous congestion. Persistent bibasilar airspace disease. IMPRESSION: Given differences in technique, similar cardiomegaly with mild pulmonary venous congestion and small bilateral pleural effusions. Electronically Signed   By: Abigail Miyamoto M.D.   On: 10/29/2016 07:10    Cardiac Studies   Echo 10/27/16: Study Conclusions  - Left ventricle: The cavity size was normal. Wall thickness was   increased in a pattern of moderate LVH. Systolic function was   vigorous. The estimated ejection fraction was in the range of 65%   to 70%. Wall motion was normal; there were no regional wall   motion abnormalities. - Ventricular septum: The contour showed diastolic flattening. - Right ventricle: The cavity size was moderately dilated. - Right atrium: The atrium was moderately dilated. - Tricuspid valve: There was severe regurgitation. - Pulmonary arteries: Systolic pressure was mildly to moderately   increased. PA peak pressure: 38 mm Hg (S).  Lexiscan Myoview 10/23/2016  There was no ST segment deviation noted  during stress.  The study is normal.  This is a low risk study.  The left ventricular ejection fraction is mildly decreased (45-54%). Nuclear stress EF: 47%.   Patient Profile     76 y.o. male with paroxysmal atrial flutter, hypertension, OSA on CPAP, DM, CKD IV, hypothyroidism, and severe COPD here with symptomatic bradycardia and cardiogenic shock.  He became bradycardic and unresponsive but reportedly never lost a pulse.  He was intubated and given atropine and epinephrine.   Assessment & Plan    # Atrial fibrillation/flutter: # Symptomatic bradycardia: Currently sinus rhythm.  Mr. Hackman was on amiodarone as an outpatient but no other nodal agents.  He is no longer bradycardic.  Glucagon was d/c'd 9/24 as was norepinephrine.  He remains on epinephrine with HR in the 70s-80.  Wean as tolerated.    # Acute on chronic diastolic heart failure: # Cardiogenic shock: Now off NE. Epi down to 9 from 10.  Wean as tolerated.  Volume control with CVVHD.  CVP was 8 at 4am.  # Elevated troponin:  Troponin elevated to 0.15-->2.34.  Patient had a recent negative stress test in the setting of feeing fatigued.  No chest pain.  This is likely 2/2 shock and acute on chronic renal failure.   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  Started CVVHD 9/24 and was net -3.2L.  Pulmonary edema has improved from yesterday.   # Fever:  # Leukocytosis:  WBC has normalized.  He became hypothermic and was started on a bear hugger.    Time spent: 40 minutes-Greater than 50% of this time was spent in counseling, explanation of diagnosis, planning of further management, and coordination of care.    For questions or updates, please contact [place] Please consult [email or web address] for contact info under Cardiology/STEMI.      Signed, Skeet Latch, MD  10/30/2016, 7:44 AM

## 2016-10-31 ENCOUNTER — Inpatient Hospital Stay (HOSPITAL_COMMUNITY): Payer: Medicare HMO

## 2016-10-31 DIAGNOSIS — J9600 Acute respiratory failure, unspecified whether with hypoxia or hypercapnia: Secondary | ICD-10-CM

## 2016-10-31 LAB — BLOOD GAS, ARTERIAL
ACID-BASE EXCESS: 1.6 mmol/L (ref 0.0–2.0)
BICARBONATE: 24 mmol/L (ref 20.0–28.0)
FIO2: 40
LHR: 34 {breaths}/min
O2 Saturation: 99 %
PEEP/CPAP: 10 cmH2O
PH ART: 7.552 — AB (ref 7.350–7.450)
Patient temperature: 98.6
VT: 550 mL
pCO2 arterial: 27.3 mmHg — ABNORMAL LOW (ref 32.0–48.0)
pO2, Arterial: 160 mmHg — ABNORMAL HIGH (ref 83.0–108.0)

## 2016-10-31 LAB — CBC WITH DIFFERENTIAL/PLATELET
BASOS PCT: 0 %
Basophils Absolute: 0 10*3/uL (ref 0.0–0.1)
EOS ABS: 0.3 10*3/uL (ref 0.0–0.7)
Eosinophils Relative: 5 %
HCT: 36.6 % — ABNORMAL LOW (ref 39.0–52.0)
HEMOGLOBIN: 12.2 g/dL — AB (ref 13.0–17.0)
Lymphocytes Relative: 7 %
Lymphs Abs: 0.4 10*3/uL — ABNORMAL LOW (ref 0.7–4.0)
MCH: 30.7 pg (ref 26.0–34.0)
MCHC: 33.3 g/dL (ref 30.0–36.0)
MCV: 92 fL (ref 78.0–100.0)
Monocytes Absolute: 0.8 10*3/uL (ref 0.1–1.0)
Monocytes Relative: 14 %
NEUTROS PCT: 74 %
Neutro Abs: 4.4 10*3/uL (ref 1.7–7.7)
Platelets: 132 10*3/uL — ABNORMAL LOW (ref 150–400)
RBC: 3.98 MIL/uL — AB (ref 4.22–5.81)
RDW: 15.3 % (ref 11.5–15.5)
WBC: 6 10*3/uL (ref 4.0–10.5)

## 2016-10-31 LAB — RENAL FUNCTION PANEL
ALBUMIN: 2.5 g/dL — AB (ref 3.5–5.0)
ALBUMIN: 2.4 g/dL — AB (ref 3.5–5.0)
ANION GAP: 11 (ref 5–15)
Anion gap: 10 (ref 5–15)
BUN: 38 mg/dL — ABNORMAL HIGH (ref 6–20)
BUN: 38 mg/dL — ABNORMAL HIGH (ref 6–20)
CALCIUM: 8.4 mg/dL — AB (ref 8.9–10.3)
CO2: 24 mmol/L (ref 22–32)
CO2: 25 mmol/L (ref 22–32)
Calcium: 8.5 mg/dL — ABNORMAL LOW (ref 8.9–10.3)
Chloride: 102 mmol/L (ref 101–111)
Chloride: 102 mmol/L (ref 101–111)
Creatinine, Ser: 3.18 mg/dL — ABNORMAL HIGH (ref 0.61–1.24)
Creatinine, Ser: 2.83 mg/dL — ABNORMAL HIGH (ref 0.61–1.24)
GFR calc non Af Amer: 17 mL/min — ABNORMAL LOW (ref >60)
GFR, EST AFRICAN AMERICAN: 20 mL/min — AB (ref >60)
GFR, EST AFRICAN AMERICAN: 23 mL/min — AB (ref >60)
GFR, EST NON AFRICAN AMERICAN: 20 mL/min — AB (ref >60)
Glucose, Bld: 78 mg/dL (ref 65–99)
Glucose, Bld: 109 mg/dL — ABNORMAL HIGH (ref 65–99)
PHOSPHORUS: 2.1 mg/dL — AB (ref 2.5–4.6)
PHOSPHORUS: 2.6 mg/dL (ref 2.5–4.6)
Potassium: 3.9 mmol/L (ref 3.5–5.1)
Potassium: 3.4 mmol/L — ABNORMAL LOW (ref 3.5–5.1)
SODIUM: 137 mmol/L (ref 135–145)
SODIUM: 137 mmol/L (ref 135–145)

## 2016-10-31 LAB — GLUCOSE, CAPILLARY
GLUCOSE-CAPILLARY: 93 mg/dL (ref 65–99)
GLUCOSE-CAPILLARY: 97 mg/dL (ref 65–99)
GLUCOSE-CAPILLARY: 85 mg/dL (ref 65–99)
GLUCOSE-CAPILLARY: 111 mg/dL — AB (ref 65–99)
Glucose-Capillary: 81 mg/dL (ref 65–99)
Glucose-Capillary: 93 mg/dL (ref 65–99)
Glucose-Capillary: 96 mg/dL (ref 65–99)

## 2016-10-31 LAB — TSH: TSH: 2.561 u[IU]/mL (ref 0.350–4.500)

## 2016-10-31 LAB — MAGNESIUM
Magnesium: 2.1 mg/dL (ref 1.7–2.4)
Magnesium: 2.4 mg/dL (ref 1.7–2.4)

## 2016-10-31 LAB — PHOSPHORUS
PHOSPHORUS: 2.1 mg/dL — AB (ref 2.5–4.6)
PHOSPHORUS: 2.6 mg/dL (ref 2.5–4.6)

## 2016-10-31 LAB — PROCALCITONIN: Procalcitonin: 44.93 ng/mL

## 2016-10-31 MED ORDER — SODIUM PHOSPHATES 45 MMOLE/15ML IV SOLN
10.0000 mmol | Freq: Once | INTRAVENOUS | Status: AC
  Administered 2016-10-31: 10 mmol via INTRAVENOUS
  Filled 2016-10-31 (×2): qty 3.33

## 2016-10-31 MED ORDER — CHLORHEXIDINE GLUCONATE CLOTH 2 % EX PADS
6.0000 | MEDICATED_PAD | Freq: Every day | CUTANEOUS | Status: DC
  Administered 2016-11-01 – 2016-11-02 (×2): 6 via TOPICAL

## 2016-10-31 MED ORDER — MIDAZOLAM HCL 2 MG/2ML IJ SOLN
1.0000 mg | INTRAMUSCULAR | Status: DC | PRN
  Administered 2016-10-31: 1 mg via INTRAVENOUS
  Filled 2016-10-31: qty 2

## 2016-10-31 MED ORDER — LEVOTHYROXINE SODIUM 50 MCG PO TABS
175.0000 ug | ORAL_TABLET | Freq: Every day | ORAL | Status: DC
  Administered 2016-11-01 – 2016-11-09 (×8): 175 ug via ORAL
  Filled 2016-10-31 (×8): qty 1

## 2016-10-31 MED ORDER — PANTOPRAZOLE SODIUM 40 MG PO PACK
40.0000 mg | PACK | Freq: Every day | ORAL | Status: DC
  Administered 2016-10-31 – 2016-11-07 (×7): 40 mg
  Filled 2016-10-31 (×7): qty 20

## 2016-10-31 MED ORDER — POTASSIUM CHLORIDE 20 MEQ/15ML (10%) PO SOLN
40.0000 meq | Freq: Once | ORAL | Status: AC
  Administered 2016-10-31: 40 meq
  Filled 2016-10-31: qty 30

## 2016-10-31 NOTE — Progress Notes (Signed)
ETT holder changed. No complications. Patient tolerated well. RT will continue to monitor.

## 2016-10-31 NOTE — Progress Notes (Signed)
 Progress Note  Patient Name: Omar Lambert Date of Encounter: 10/31/2016  Primary Cardiologist: Dr. Kelly   Subjective   Unable to obtain. Intubated, sedated, and paralyzed.   Inpatient Medications    Scheduled Meds: . chlorhexidine gluconate (MEDLINE KIT)  15 mL Mouth Rinse BID  . Chlorhexidine Gluconate Cloth  6 each Topical Daily  . feeding supplement (PRO-STAT SUGAR FREE 64)  60 mL Per Tube BID  . feeding supplement (VITAL HIGH PROTEIN)  1,000 mL Per Tube Q24H  . heparin  5,000 Units Subcutaneous Q8H  . insulin aspart  0-15 Units Subcutaneous Q4H  . mouth rinse  15 mL Mouth Rinse 10 times per day  . mupirocin ointment  1 application Nasal BID  . pantoprazole (PROTONIX) IV  40 mg Intravenous QHS  . sodium chloride flush  10-40 mL Intracatheter Q12H  . sodium chloride flush  10-40 mL Intracatheter Q12H   Continuous Infusions: . sodium chloride Stopped (10/27/16 1515)  . sodium chloride    . sodium chloride Stopped (10/29/16 2021)  . epinephrine Stopped (10/30/16 1141)  . fentaNYL infusion INTRAVENOUS Stopped (10/30/16 1810)  . heparin    . midazolam (VERSED) infusion Stopped (10/30/16 1116)  . norepinephrine (LEVOPHED) Adult infusion 9 mcg/min (10/31/16 0735)  . piperacillin-tazobactam (ZOSYN)  IV Stopped (10/31/16 0535)  . dialysis replacement fluid (prismasate) 300 mL/hr at 10/31/16 0401  . dialysis replacement fluid (prismasate) 300 mL/hr at 10/31/16 0415  . dialysate (PRISMASATE) 1,500 mL/hr at 10/31/16 0747   PRN Meds: sodium chloride, sodium chloride, fentaNYL, fentaNYL, heparin, heparin, midazolam, midazolam, midazolam, sodium chloride flush, sodium chloride flush   Vital Signs    Vitals:   10/31/16 0545 10/31/16 0600 10/31/16 0700 10/31/16 0754  BP:  98/67 (!) 93/59   Pulse: 63 66 69 77  Resp: (!) 35 (!) 35 (!) 35 (!) 35  Temp: (!) 96.3 F (35.7 C) (!) 96.4 F (35.8 C) (!) 97.3 F (36.3 C) 98.2 F (36.8 C)  TempSrc:      SpO2: 100% 100% 100% 100%    Weight:      Height:        Intake/Output Summary (Last 24 hours) at 10/31/16 0809 Last data filed at 10/31/16 0800  Gross per 24 hour  Intake          1703.35 ml  Output             4643 ml  Net         -2939.65 ml   Filed Weights   10/29/16 0455 10/30/16 0400 10/31/16 0300  Weight: 104 kg (229 lb 4.5 oz) 104.4 kg (230 lb 2.6 oz) 100.7 kg (222 lb 0.1 oz)    Telemetry    Sinus rhythm.  Prolonged first degree heart block.  - Personally Reviewed  ECG    10/28/11: Atrial fibrillation.  Rate 69 bpm. - Personally Reviewed  Physical Exam   VS:  BP (!) 93/59   Pulse 77   Temp 98.2 F (36.8 C)   Resp (!) 35   Ht 5' 8" (1.727 m)   Wt 100.7 kg (222 lb 0.1 oz)   SpO2 100%   BMI 33.76 kg/m  , BMI Body mass index is 33.76 kg/m. GENERAL:  Critically ill-appearing.  No acute distress. NECK:  Unable to assess JVD 2/2 lines LUNGS:  Vented breath sounds on anterior exam.  No crackles, wheezes or rhonchi HEART:  RRR.  PMI not displaced or sustained,S1 and S2 within normal limits, no S3,   no S4, no clicks, no rubs, no murmurs ABD:  Soft, positive bowel sounds normal in frequency in pitch, no bruits, no rebound, no guarding, no midline pulsatile mass, no hepatomegaly, no splenomegaly EXT: no edema, no cyanosis no clubbing SKIN:  No rashes no nodules NEURO:  Unable to assess. Paralyzed and sedated PSYCH:  Unable to assess. Paralyzed and sedated   Labs    Chemistry Recent Labs Lab 10/27/16 0025  10/30/16 0412 10/30/16 1539 10/31/16 0335  NA 142  < > 133* 134* 137  K 4.9  < > 4.2 4.3 3.9  CL 100*  < > 98* 101 102  CO2 22  < > _0 GLUCOSE 100*  < > 110* 71 78  BUN 44*  < > 58* 44* 38*  CREATININE 3.96*  < > 4.87* 3.98* 3.18*  CALCIUM 9.7  < > 8.1* 8.2* 8.4*  PROT 6.9  --   --   --   --   ALBUMIN 3.4*  < > 2.5* 2.5* 2.5*  AST 361*  --   --   --   --   ALT 290*  --   --   --   --   ALKPHOS 169*  --   --   --   --   BILITOT 1.9*  --   --   --   --   GFRNONAA 13*  < >  10* 13* 17*  GFRAA 15*  < > 12* 15* 20*  ANIONGAP 20*  < > _1 < > = values in this interval not displayed.   Hematology  Recent Labs Lab 10/29/16 0450 10/30/16 0412 10/31/16 0335  WBC 11.1* 8.0 6.0  RBC 3.71* 3.82* 3.98*  HGB 11.4* 11.8* 12.2*  HCT 33.6* 34.5* 36.6*  MCV 90.6 90.3 92.0  MCH 30.7 30.9 30.7  MCHC 33.9 34.2 33.3  RDW 15.3 15.2 15.3  PLT 116* 130* 132*    Cardiac Enzymes  Recent Labs Lab 10/28/16 1141  TROPONINI 2.34*     Recent Labs Lab 10/27/16 0038  TROPIPOC 0.15*     BNPNo results for input(s): BNP, PROBNP in the last 168 hours.   DDimer No results for input(s): DDIMER in the last 168 hours.   Radiology    Dg Chest Port 1 View  Result Date: 10/31/2016 CLINICAL DATA:  Endotracheal tube.  Respiratory failure EXAM: PORTABLE CHEST 1 VIEW COMPARISON:  Yesterday FINDINGS: Endotracheal tube tip between the clavicular heads and carina. Right IJ central line with tip at the upper SVC. An orogastric tube reaches the stomach at least. Haziness of the lower chest that is likely combination of atelectasis and pleural fluid. No Kerley lines or air bronchograms. No pneumothorax. Large lung volumes. Likely stable heart size, accentuated by rotation. IMPRESSION: 1. Stable positioning of tubes and central line. 2. Layering pleural effusions and presumed atelectasis. Obscured left more than right lower lobes. Electronically Signed   By: Monte Fantasia M.D.   On: 10/31/2016 07:30   Dg Chest Portable 1 View  Result Date: 10/30/2016 CLINICAL DATA:  Respiratory failure. EXAM: PORTABLE CHEST 1 VIEW COMPARISON:  Radiograph of October 29, 2016. FINDINGS: Stable cardiomegaly. Atherosclerosis of thoracic aorta is noted. Endotracheal and nasogastric tubes are unchanged in position. Right internal jugular catheter is unchanged in position. Stable bibasilar atelectasis or edema is noted with mild associated pleural effusions. Bony thorax is unremarkable. IMPRESSION:  Aortic atherosclerosis. Stable support apparatus. Stable bibasilar atelectasis or edema is noted with mild  associated pleural effusions. Electronically Signed   By: Marijo Conception, M.D.   On: 10/30/2016 07:28   Dg Chest Port 1 View  Result Date: 10/29/2016 CLINICAL DATA:  Attempted placement of left internal jugular venous catheter with inability to advance the catheter. EXAM: PORTABLE CHEST 1 VIEW COMPARISON:  Portable chest x-ray of earlier today. FINDINGS: The lungs are well-expanded. There is no postprocedure pneumothorax. There remain increased lung markings at both bases likely reflecting pleural fluid layering posteriorly. The cardiac silhouette remains mildly enlarged. The central pulmonary vascularity is mildly prominent. There is calcification in the wall of the aortic arch. The endotracheal tube tip measures approximately 5.6 cm above the carina. The esophagogastric tube tip projects below the inferior margin of the image. The right internal jugular venous catheter tip projects over the proximal SVC. IMPRESSION: No postprocedure pneumothorax or hemothorax following left internal jugular catheter placement attempt. Stable underlying mild CHF. Bilateral pleural effusions layering posteriorly. Thoracic aortic atherosclerosis. The support tubes are in stable position. Electronically Signed   By: David  Martinique M.D.   On: 10/29/2016 15:32    Cardiac Studies   Echo 10/27/16: Study Conclusions  - Left ventricle: The cavity size was normal. Wall thickness was   increased in a pattern of moderate LVH. Systolic function was   vigorous. The estimated ejection fraction was in the range of 65%   to 70%. Wall motion was normal; there were no regional wall   motion abnormalities. - Ventricular septum: The contour showed diastolic flattening. - Right ventricle: The cavity size was moderately dilated. - Right atrium: The atrium was moderately dilated. - Tricuspid valve: There was severe  regurgitation. - Pulmonary arteries: Systolic pressure was mildly to moderately   increased. PA peak pressure: 38 mm Hg (S).  Lexiscan Myoview 10/23/2016  There was no ST segment deviation noted during stress.  The study is normal.  This is a low risk study.  The left ventricular ejection fraction is mildly decreased (45-54%). Nuclear stress EF: 47%.   Patient Profile     76 y.o. male with paroxysmal atrial flutter, hypertension, OSA on CPAP, DM, CKD IV, hypothyroidism, and severe COPD here with symptomatic bradycardia and cardiogenic shock.  He became bradycardic and unresponsive but reportedly never lost a pulse.  He was intubated and given atropine and epinephrine.   Assessment & Plan    # Atrial fibrillation/flutter: # Symptomatic bradycardia: Currently sinus rhythm.  Omar Lambert was on amiodarone as an outpatient but no other nodal agents.  He is no longer bradycardic.  Glucagon was d/c'd 9/24 as was norepinephrine.  Epinephrine was stopped 9/25 and he is now only on NE.  Wean as tolerated.    # Acute on chronic diastolic heart failure: # Cardiogenic shock: Now only on NE. Wean as tolerated.  Volume control with CVVHD.  CVP 12.  # Elevated troponin:  Troponin elevated to 0.15-->2.34.  Patient had a recent negative stress test in the setting of feeing fatigued.  No chest pain.  This is likely 2/2 shock and acute on chronic renal failure.   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  Started CVVHD 9/24 and was net -3.2L.  Pulmonary edema has improved from yesterday.    # Fever:  # Leukocytosis:  WBC has normalized.  He became hypothermic and was started on a bear hugger.     Time spent: 30 minutes-Greater than 50% of this time was spent in counseling, explanation of  diagnosis, planning of further management, and coordination of care.    For questions or updates, please contact [facility] Please consult [email or web address] for contact info  under Cardiology/STEMI.      Signed,  [place], MD  10/31/2016, 8:09 AM     

## 2016-10-31 NOTE — Progress Notes (Signed)
Subjective:  Remains in ICU on epi and norepi but doses coming down, now off epi - good UOP 2800 last 24 hours off lasix. CRRT running well.    Objective Vital signs in last 24 hours: Vitals:   10/31/16 0545 10/31/16 0600 10/31/16 0700 10/31/16 0754  BP:  98/67 (!) 93/59   Pulse: 63 66 69 77  Resp: (!) 35 (!) 35 (!) 35 (!) 35  Temp: (!) 96.3 F (35.7 C) (!) 96.4 F (35.8 C) (!) 97.3 F (36.3 C) 98.2 F (36.8 C)  TempSrc:      SpO2: 100% 100% 100% 100%  Weight:      Height:       Weight change: -3.7 kg (-8 lb 2.5 oz)  Intake/Output Summary (Last 24 hours) at 10/31/16 0819 Last data filed at 10/31/16 0800  Gross per 24 hour  Intake          1683.35 ml  Output             4643 ml  Net         -2959.65 ml    Assessment/ Plan: Pt is a 76 y.o. yo male who was admitted on 10/27/2016 with being poorly responsive after eye surgery- has advanced CKD at baseline- now with A on CRF  Assessment/Plan: 1. Renal- non oliguric A on CRF- apparently plans starting to be made for dialysis as OP- pt uremic and in the setting of being critically ill even though no absolute indications I felt it best to initiate dialysis in order for him to overall imporve.  Daughter agreed, s/p vascath and started CRRT late 9/25. Labs and volume status improved, to continue CRRT I think at least another 24 hours.  K is good- will give some phos 2. HTN/vol- was overloaded by exam- now much better- responding to lasix and CRRT- response too brisk yest, lasix now stopped - CVP mostly 12- will set UF at 50 ccs per hour for now 3. Anemia- hgb over 11- no action needed yet     Genevieve Arbaugh A    Labs: Basic Metabolic Panel:  Recent Labs Lab 10/30/16 0412 10/30/16 1539 10/31/16 0335  NA 133* 134* 137  K 4.2 4.3 3.9  CL 98* 101 102  CO2 _0 GLUCOSE 110* 71 78  BUN 58* 44* 38*  CREATININE 4.87* 3.98* 3.18*  CALCIUM 8.1* 8.2* 8.4*  PHOS 3.7 3.0 2.1*  2.1*   Liver Function Tests:  Recent  Labs Lab 10/27/16 0025  10/30/16 0412 10/30/16 1539 10/31/16 0335  AST 361*  --   --   --   --   ALT 290*  --   --   --   --   ALKPHOS 169*  --   --   --   --   BILITOT 1.9*  --   --   --   --   PROT 6.9  --   --   --   --   ALBUMIN 3.4*  < > 2.5* 2.5* 2.5*  < > = values in this interval not displayed. No results for input(s): LIPASE, AMYLASE in the last 168 hours. No results for input(s): AMMONIA in the last 168 hours. CBC:  Recent Labs Lab 10/27/16 0025  10/27/16 1730 10/28/16 0418 10/29/16 0450 10/30/16 0412 10/31/16 0335  WBC 11.2*  < > 16.6* 17.5* 11.1* 8.0 6.0  NEUTROABS 9.0*  --  14.8*  --   --   --  4.4  HGB 11.7*  < >  11.3* 11.3* 11.4* 11.8* 12.2*  HCT 40.9  < > 35.6* 34.9* 33.6* 34.5* 36.6*  MCV 109.1*  < > 97.5 94.8 90.6 90.3 92.0  PLT 145*  < > 124* 104* 116* 130* 132*  < > = values in this interval not displayed. Cardiac Enzymes:  Recent Labs Lab 10/28/16 1141  TROPONINI 2.34*   CBG:  Recent Labs Lab 10/30/16 1545 10/30/16 1935 10/31/16 0006 10/31/16 0345 10/31/16 0732  GLUCAP 70 93 93 81 93    Iron Studies: No results for input(s): IRON, TIBC, TRANSFERRIN, FERRITIN in the last 72 hours. Studies/Results: Dg Chest Port 1 View  Result Date: 10/31/2016 CLINICAL DATA:  Endotracheal tube.  Respiratory failure EXAM: PORTABLE CHEST 1 VIEW COMPARISON:  Yesterday FINDINGS: Endotracheal tube tip between the clavicular heads and carina. Right IJ central line with tip at the upper SVC. An orogastric tube reaches the stomach at least. Haziness of the lower chest that is likely combination of atelectasis and pleural fluid. No Kerley lines or air bronchograms. No pneumothorax. Large lung volumes. Likely stable heart size, accentuated by rotation. IMPRESSION: 1. Stable positioning of tubes and central line. 2. Layering pleural effusions and presumed atelectasis. Obscured left more than right lower lobes. Electronically Signed   By: Monte Fantasia M.D.   On:  10/31/2016 07:30   Dg Chest Portable 1 View  Result Date: 10/30/2016 CLINICAL DATA:  Respiratory failure. EXAM: PORTABLE CHEST 1 VIEW COMPARISON:  Radiograph of October 29, 2016. FINDINGS: Stable cardiomegaly. Atherosclerosis of thoracic aorta is noted. Endotracheal and nasogastric tubes are unchanged in position. Right internal jugular catheter is unchanged in position. Stable bibasilar atelectasis or edema is noted with mild associated pleural effusions. Bony thorax is unremarkable. IMPRESSION: Aortic atherosclerosis. Stable support apparatus. Stable bibasilar atelectasis or edema is noted with mild associated pleural effusions. Electronically Signed   By: Marijo Conception, M.D.   On: 10/30/2016 07:28   Dg Chest Port 1 View  Result Date: 10/29/2016 CLINICAL DATA:  Attempted placement of left internal jugular venous catheter with inability to advance the catheter. EXAM: PORTABLE CHEST 1 VIEW COMPARISON:  Portable chest x-ray of earlier today. FINDINGS: The lungs are well-expanded. There is no postprocedure pneumothorax. There remain increased lung markings at both bases likely reflecting pleural fluid layering posteriorly. The cardiac silhouette remains mildly enlarged. The central pulmonary vascularity is mildly prominent. There is calcification in the wall of the aortic arch. The endotracheal tube tip measures approximately 5.6 cm above the carina. The esophagogastric tube tip projects below the inferior margin of the image. The right internal jugular venous catheter tip projects over the proximal SVC. IMPRESSION: No postprocedure pneumothorax or hemothorax following left internal jugular catheter placement attempt. Stable underlying mild CHF. Bilateral pleural effusions layering posteriorly. Thoracic aortic atherosclerosis. The support tubes are in stable position. Electronically Signed   By: David  Martinique M.D.   On: 10/29/2016 15:32   Medications: Infusions: . sodium chloride Stopped (10/27/16  1515)  . sodium chloride    . sodium chloride Stopped (10/29/16 2021)  . epinephrine Stopped (10/30/16 1141)  . fentaNYL infusion INTRAVENOUS Stopped (10/30/16 1810)  . heparin    . midazolam (VERSED) infusion Stopped (10/30/16 1116)  . norepinephrine (LEVOPHED) Adult infusion 9 mcg/min (10/31/16 0735)  . piperacillin-tazobactam (ZOSYN)  IV Stopped (10/31/16 0535)  . dialysis replacement fluid (prismasate) 300 mL/hr at 10/31/16 0401  . dialysis replacement fluid (prismasate) 300 mL/hr at 10/31/16 0415  . dialysate (PRISMASATE) 1,500 mL/hr at 10/31/16 [phone]  Scheduled Medications: . chlorhexidine gluconate (MEDLINE KIT)  15 mL Mouth Rinse BID  . Chlorhexidine Gluconate Cloth  6 each Topical Daily  . feeding supplement (PRO-STAT SUGAR FREE 64)  60 mL Per Tube BID  . feeding supplement (VITAL HIGH PROTEIN)  1,000 mL Per Tube Q24H  . heparin  5,000 Units Subcutaneous Q8H  . insulin aspart  0-15 Units Subcutaneous Q4H  . mouth rinse  15 mL Mouth Rinse 10 times per day  . mupirocin ointment  1 application Nasal BID  . pantoprazole (PROTONIX) IV  40 mg Intravenous QHS  . sodium chloride flush  10-40 mL Intracatheter Q12H  . sodium chloride flush  10-40 mL Intracatheter Q12H    have reviewed scheduled and prn medications.  Physical Exam: General: sedated on vent Heart: RRR Lungs: on vent- CBS Abdomen: obese, soft, non tender Extremities: pitting edema but better  Dialysis Access: vascath placed 9/24    10/31/2016,8:19 AM  LOS: 4 days

## 2016-10-31 NOTE — Progress Notes (Signed)
[facility]ician Progress Note and Electrolyte Replacement  Patient Name: Omar Lambert DOB: January 14, 1941 MRN: [id number]  Date of Service  10/31/2016   HPI/Events of Note    Recent Labs Lab 10/29/16 0450 10/29/16 1608 10/30/16 0412 10/30/16 1539 10/31/16 0335 10/31/16 1559  NA 133* 133* 133* 134* 137 137  K 4.0 4.4 4.2 4.3 3.9 3.4*  CL 98* 98* 98* 101 102 102  CO2 21* 21* 23 22 24 25   GLUCOSE 124* 99 110* 71 78 109*  BUN 79* 83* 58* 44* 38* 38*  CREATININE 6.50* 6.98* 4.87* 3.98* 3.18* 2.83*  CALCIUM 8.0* 8.0* 8.1* 8.2* 8.4* 8.5*  MG 1.7  --  2.1  --  2.1  --   PHOS 4.5 5.4* 3.7 3.0 2.1*  2.1* 2.6    Estimated Creatinine Clearance: 25.1 mL/min (A) (by C-G formula based on SCr of 2.83 mg/dL (H)).  Intake/Output      09/26 0701 - 09/27 0700   I.V. (mL/kg) 90.6 (0.9)   NG/GT 620   IV Piggyback 382   Total Intake(mL/kg) 1092.6 (10.9)   Urine (mL/kg/hr) 1825 (1.4)   Emesis/NG output 0   Other 471   Total Output 2296   Net -1203.4       Stool Occurrence 2 x    - I/O DETAILED x 24h    Total I/O In: 84.7 [I.V.:4.7; NG/GT:80] Out: 95 [Urine:90; Other:5] - I/O THIS SHIFT    ASSESSMENT Low k Makes urine On crrt  eICURN Interventions  kcl 40 meq x 1 via tube   ASSESSMENT: MAJOR ELECTROLYTE      Dr. Brand Males, M.D., [facility].C.P Pulmonary and Critical Care Medicine Staff Physician [facility] Pager: [phone], If no answer or between  15:00h - 7:00h: call [phone]  10/31/2016 8:17 PM

## 2016-10-31 NOTE — Progress Notes (Signed)
Name: Omar Lambert MRN: [id number] DOB: 09-30-1940    ADMISSION DATE:  10/27/2016 CONSULTATION DATE: 10/27/16  REFERRING MD : ER ATTENDING DELO  CHIEF COMPLAINT:  BRADYCARDIA, UNRESPONSIVE  BRIEF PATIENT DESCRIPTION: 76 YR OLD MALE W/ PMHX CHF, CKD STAGE III/IV ( seen by Nephro  and discussing possible dialysis), Afib RVR, A flutter, DM presents after opthalmic surgery on 9/21 AM. Post surgery wife found him unresponsive at home at midnight. Bradycardic and unresponsive never lost pulse. Intubated, given atropine, started on Epinephrine, BP 27C systolic highest Hr in 62B. PCCM consulted   EKG - atrial fibrillation with slow ventricular response with RBBB low QRS voltage  ECHO  08/18/2015>> nml LVSF ,RVSP 40  Stress test nuclear lexiscan 10/23/2016  This is a low risk study.  Nuclear stress EF: 47%.   STUDIES:  CT HEAD 9/22>>> Negative    SIGNIFICANT EVENTS  ETT 9/23>> RIGHT IJ CVC 9/23>> A-LINE 9/23 >> 9/24 -  paralyzed, sedated.  Remains on epi gtt r/t bradycardia, levophed 86mcg.  9/24 CRRT   SUBJECTIVE/OVERNIGHT/INTERVAL HX  Unresponsive off paralytic & sedation since 9/25 Hypothermic on CRRT, on warmer   VITAL SIGNS: Temp:  [96.1 F (35.6 C)-98.2 F (36.8 C)] 98.2 F (36.8 C) (09/26 0754) Pulse Rate:  [62-89] 80 (09/26 0822) Resp:  [28-35] 35 (09/26 0754) BP: (78-140)/(45-84) 104/57 (09/26 0822) SpO2:  [100 %] 100 % (09/26 0754) Arterial Line BP: (80-171)/(45-84) 114/60 (09/26 0754) FiO2 (%):  [40 %-50 %] 40 % (09/26 0822) Weight:  [222 lb 0.1 oz (100.7 kg)] 222 lb 0.1 oz (100.7 kg) (09/26 0300)        Gen. Chr ill appearing, well-nourished, in no distress, normal affect ENT - rt eye bandage, oral ETT Neck: No JVD, no thyromegaly, no carotid bruits Lungs: no use of accessory muscles, no dullness to percussion, decreased without rales or rhonchi  Cardiovascular: Rhythm regular, heart sounds  normal, no murmurs, no peripheral edema Abdomen: soft  and non-tender, no hepatosplenomegaly, BS normal. Musculoskeletal: No deformities, no cyanosis or clubbing Neuro:  Unresponsive, does not follow commands Skin:  Warm, no rash , ana sarca , left foot wound with sero sangdrainage  PULMONARY  Recent Labs Lab 10/27/16 1852 10/28/16 0431 10/28/16 0957 10/28/16 1042 10/28/16 1213  PHART 7.482* 7.457* 7.243* 7.205* 7.483*  PCO2ART 33.7 31.8* 56.7* 56.7* 29.4*  PO2ART 80.0* 102.0 108.0 78.0* 224.0*  HCO3 25.0 22.2 24.5 22.6 22.1  TCO2 26 23 26 24 23   O2SAT 96.0 98.0 97.0 92.0 100.0    CBC  Recent Labs Lab 10/29/16 0450 10/30/16 0412 10/31/16 0335  HGB 11.4* 11.8* 12.2*  HCT 33.6* 34.5* 36.6*  WBC 11.1* 8.0 6.0  PLT 116* 130* 132*    COAGULATION  Recent Labs Lab 10/27/16 0025  INR 2.03    CARDIAC    Recent Labs Lab 10/28/16 1141  TROPONINI 2.34*   No results for input(s): PROBNP in the last 168 hours.   CHEMISTRY  Recent Labs Lab 10/29/16 0450 10/29/16 1608 10/30/16 0412 10/30/16 1539 10/31/16 0335  NA 133* 133* 133* 134* 137  K 4.0 4.4 4.2 4.3 3.9  CL 98* 98* 98* 101 102  CO2 21* 21* 23 22 24   GLUCOSE 124* 99 110* 71 78  BUN 79* 83* 58* 44* 38*  CREATININE 6.50* 6.98* 4.87* 3.98* 3.18*  CALCIUM 8.0* 8.0* 8.1* 8.2* 8.4*  MG 1.7  --  2.1  --  2.1  PHOS 4.5 5.4* 3.7 3.0 2.1*  2.1*   Estimated  Creatinine Clearance: 22.4 mL/min (A) (by C-G formula based on SCr of 3.18 mg/dL (H)).   LIVER  Recent Labs Lab 10/27/16 0025 10/29/16 1608 10/30/16 0412 10/30/16 1539 10/31/16 0335  AST 361*  --   --   --   --   ALT 290*  --   --   --   --   ALKPHOS 169*  --   --   --   --   BILITOT 1.9*  --   --   --   --   PROT 6.9  --   --   --   --   ALBUMIN 3.4* 2.5* 2.5* 2.5* 2.5*  INR 2.03  --   --   --   --      INFECTIOUS  Recent Labs Lab 10/27/16 0400 10/27/16 1715 10/27/16 2318 10/29/16 0939 10/30/16 0412 10/31/16 0335  LATICACIDVEN 11.4* 2.9* 2.3*  --   --   --   PROCALCITON  --   --    --  93.09 61.68 44.93     ENDOCRINE CBG (last 3)   Recent Labs  10/31/16 0006 10/31/16 0345 10/31/16 0732  GLUCAP 93 81 93         IMAGING x48h  - image(s) personally visualized  -   highlighted in bold Dg Chest Port 1 View  Result Date: 10/31/2016 CLINICAL DATA:  Endotracheal tube.  Respiratory failure EXAM: PORTABLE CHEST 1 VIEW COMPARISON:  Yesterday FINDINGS: Endotracheal tube tip between the clavicular heads and carina. Right IJ central line with tip at the upper SVC. An orogastric tube reaches the stomach at least. Haziness of the lower chest that is likely combination of atelectasis and pleural fluid. No Kerley lines or air bronchograms. No pneumothorax. Large lung volumes. Likely stable heart size, accentuated by rotation. IMPRESSION: 1. Stable positioning of tubes and central line. 2. Layering pleural effusions and presumed atelectasis. Obscured left more than right lower lobes. Electronically Signed   By: Monte Fantasia M.D.   On: 10/31/2016 07:30   Dg Chest Portable 1 View  Result Date: 10/30/2016 CLINICAL DATA:  Respiratory failure. EXAM: PORTABLE CHEST 1 VIEW COMPARISON:  Radiograph of October 29, 2016. FINDINGS: Stable cardiomegaly. Atherosclerosis of thoracic aorta is noted. Endotracheal and nasogastric tubes are unchanged in position. Right internal jugular catheter is unchanged in position. Stable bibasilar atelectasis or edema is noted with mild associated pleural effusions. Bony thorax is unremarkable. IMPRESSION: Aortic atherosclerosis. Stable support apparatus. Stable bibasilar atelectasis or edema is noted with mild associated pleural effusions. Electronically Signed   By: Marijo Conception, M.D.   On: 10/30/2016 07:28   Dg Chest Port 1 View  Result Date: 10/29/2016 CLINICAL DATA:  Attempted placement of left internal jugular venous catheter with inability to advance the catheter. EXAM: PORTABLE CHEST 1 VIEW COMPARISON:  Portable chest x-ray of earlier today.  FINDINGS: The lungs are well-expanded. There is no postprocedure pneumothorax. There remain increased lung markings at both bases likely reflecting pleural fluid layering posteriorly. The cardiac silhouette remains mildly enlarged. The central pulmonary vascularity is mildly prominent. There is calcification in the wall of the aortic arch. The endotracheal tube tip measures approximately 5.6 cm above the carina. The esophagogastric tube tip projects below the inferior margin of the image. The right internal jugular venous catheter tip projects over the proximal SVC. IMPRESSION: No postprocedure pneumothorax or hemothorax following left internal jugular catheter placement attempt. Stable underlying mild CHF. Bilateral pleural effusions layering posteriorly. Thoracic aortic atherosclerosis. The support  tubes are in stable position. Electronically Signed   By: David  Martinique M.D.   On: 10/29/2016 15:32   Reviewed images   ASSESSMENT / PLAN:  PULMONARY A: Acute resp failure with ? ALI physiology  S/p nimbex x 48h P:   Full vent support Drop PEEP to 5 Drop RR to 25 due to resp alkalosis  CARDIOVASCULAR A:  #admitted with bradycardia following timolol eye drop circulatory shock - off pressors H/o AFib on apixaban  P:  MAP goal > 85 Amio on hold Dc apixaban - will have to start IV heparin at some point when OK with eye Sx  RENAL  Intake/Output Summary (Last 24 hours) at 10/31/16 1026 Last data filed at 10/31/16 1000  Gross per 24 hour  Intake          1664.41 ml  Output             4568 ml  Net         -2903.59 ml    Recent Labs Lab 10/29/16 0450 10/29/16 1608 10/30/16 0412 10/30/16 1539 10/31/16 0335  CREATININE 6.50* 6.98* 4.87* 3.98* 3.18*     A:   #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  10/31/2016 -> on crrt since 10/29/16  P:   Per renal  GASTROINTESTINAL A:   NPO  P:   TF PPI  HEMATOLOGIC  Recent Labs Lab 10/29/16 0450 10/30/16 0412 10/31/16 0335    HGB 11.4* 11.8* 12.2*  HCT 33.6* 34.5* 36.6*  WBC 11.1* 8.0 6.0  PLT 116* 130* 132*    A:   #RBC: mild anemia critical illness #Platelet mild thrombocytopenia - improving #WBC lekyocytosis -resolved  P:  follow   INFECTIOUS  A:   MSSA PCR + and PCT c/w Septic shock - likely aspiration pneumonia - ct zosyn, dc vanc  ENDOCRINE A:   DM-2 Hypothyroid P:   ICU hyperglycemia protocol Resume synthroid, check TSH  NEUROLOGIC A:   #Baseline : Acute encephalopathy noted post admission  P:   RASS goal: 0  Wait for versed to clear , stopped 9/25   FAMILY  - Updates: 10/31/2016 --> wife updated at length in ICU at bedside.  - Inter-disciplinary family meet or Palliative Care meeting due by:  DAy 7. Current LOS is LOS 4 days  The patient is critically ill with multiple organ systems failure and requires high complexity decision making for assessment and support, frequent evaluation and titration of therapies, application of advanced monitoring technologies and extensive interpretation of multiple databases. Critical Care Time devoted to patient care services described in this note independent of APP time is 35 minutes.    Kara Mead MD. [facility].  Pulmonary & Critical care Pager [phone] If no response call [phone]    10/31/2016 10:26 AM

## 2016-10-31 NOTE — Progress Notes (Signed)
138mL of Fentanyl and 23mL of Versed wasted down sink with Juanda Crumble, Therapist, sports.

## 2016-10-31 NOTE — Progress Notes (Signed)
[facility] called, notified of K 3.4 from evening labs. Gave orders for one-time dose potassium via OGT.

## 2016-10-31 NOTE — Plan of Care (Signed)
Problem: Fluid Volume: Goal: Ability to maintain a balanced intake and output will improve Outcome: Progressing CRRT goal to remove net amount ~50cc/hr.  Problem: Nutrition: Goal: Adequate nutrition will be maintained Outcome: Progressing Pt on tube feed (VHP @40cc /hr).  Problem: Role Relationship: Goal: Method of communication will improve Outcome: Not Progressing Pt unable to communicate at this time.

## 2016-11-01 ENCOUNTER — Inpatient Hospital Stay (HOSPITAL_COMMUNITY): Payer: Medicare HMO

## 2016-11-01 LAB — CBC WITH DIFFERENTIAL/PLATELET
BASOS PCT: 0 %
Basophils Absolute: 0 10*3/uL (ref 0.0–0.1)
Eosinophils Absolute: 0.3 10*3/uL (ref 0.0–0.7)
Eosinophils Relative: 6 %
HEMATOCRIT: 35 % — AB (ref 39.0–52.0)
Hemoglobin: 11.7 g/dL — ABNORMAL LOW (ref 13.0–17.0)
Lymphocytes Relative: 9 %
Lymphs Abs: 0.5 10*3/uL — ABNORMAL LOW (ref 0.7–4.0)
MCH: 30.9 pg (ref 26.0–34.0)
MCHC: 33.4 g/dL (ref 30.0–36.0)
MCV: 92.3 fL (ref 78.0–100.0)
MONO ABS: 0.7 10*3/uL (ref 0.1–1.0)
MONOS PCT: 13 %
NEUTROS ABS: 3.9 10*3/uL (ref 1.7–7.7)
Neutrophils Relative %: 72 %
Platelets: 152 10*3/uL (ref 150–400)
RBC: 3.79 MIL/uL — ABNORMAL LOW (ref 4.22–5.81)
RDW: 15.9 % — AB (ref 11.5–15.5)
WBC: 5.4 10*3/uL (ref 4.0–10.5)

## 2016-11-01 LAB — RENAL FUNCTION PANEL
ALBUMIN: 2.5 g/dL — AB (ref 3.5–5.0)
ALBUMIN: 2.5 g/dL — AB (ref 3.5–5.0)
Anion gap: 13 (ref 5–15)
Anion gap: 9 (ref 5–15)
BUN: 37 mg/dL — AB (ref 6–20)
BUN: 35 mg/dL — AB (ref 6–20)
CALCIUM: 8.5 mg/dL — AB (ref 8.9–10.3)
CALCIUM: 8.6 mg/dL — AB (ref 8.9–10.3)
CO2: 22 mmol/L (ref 22–32)
CO2: 25 mmol/L (ref 22–32)
CREATININE: 2.51 mg/dL — AB (ref 0.61–1.24)
Chloride: 103 mmol/L (ref 101–111)
Chloride: 104 mmol/L (ref 101–111)
Creatinine, Ser: 2.66 mg/dL — ABNORMAL HIGH (ref 0.61–1.24)
GFR calc Af Amer: 25 mL/min — ABNORMAL LOW (ref >60)
GFR calc Af Amer: 27 mL/min — ABNORMAL LOW (ref >60)
GFR calc non Af Amer: 22 mL/min — ABNORMAL LOW (ref >60)
GFR, EST NON AFRICAN AMERICAN: 23 mL/min — AB (ref >60)
GLUCOSE: 103 mg/dL — AB (ref 65–99)
GLUCOSE: 102 mg/dL — AB (ref 65–99)
PHOSPHORUS: 3.1 mg/dL (ref 2.5–4.6)
PHOSPHORUS: 2.6 mg/dL (ref 2.5–4.6)
POTASSIUM: 3.7 mmol/L (ref 3.5–5.1)
Potassium: 4 mmol/L (ref 3.5–5.1)
SODIUM: 138 mmol/L (ref 135–145)
SODIUM: 138 mmol/L (ref 135–145)

## 2016-11-01 LAB — CULTURE, BLOOD (ROUTINE X 2)
Culture: NO GROWTH
Culture: NO GROWTH
SPECIAL REQUESTS: ADEQUATE
SPECIAL REQUESTS: ADEQUATE

## 2016-11-01 LAB — GLUCOSE, CAPILLARY
GLUCOSE-CAPILLARY: 91 mg/dL (ref 65–99)
GLUCOSE-CAPILLARY: 99 mg/dL (ref 65–99)
GLUCOSE-CAPILLARY: 94 mg/dL (ref 65–99)
GLUCOSE-CAPILLARY: 94 mg/dL (ref 65–99)
Glucose-Capillary: 101 mg/dL — ABNORMAL HIGH (ref 65–99)

## 2016-11-01 LAB — PHOSPHORUS: Phosphorus: 3.1 mg/dL (ref 2.5–4.6)

## 2016-11-01 LAB — MAGNESIUM
MAGNESIUM: 2.3 mg/dL (ref 1.7–2.4)
Magnesium: 2.4 mg/dL (ref 1.7–2.4)

## 2016-11-01 MED ORDER — VITAL HIGH PROTEIN PO LIQD
1000.0000 mL | ORAL | Status: DC
  Administered 2016-11-01 – 2016-11-03 (×3): 1000 mL
  Administered 2016-11-03 – 2016-11-04 (×2)
  Administered 2016-11-04: 1000 mL
  Administered 2016-11-04 (×2)
  Administered 2016-11-05: 1000 mL
  Administered 2016-11-05: 15:00:00

## 2016-11-01 MED ORDER — FENTANYL CITRATE (PF) 100 MCG/2ML IJ SOLN
25.0000 ug | INTRAMUSCULAR | Status: DC | PRN
  Administered 2016-11-01 (×2): 100 ug via INTRAVENOUS
  Administered 2016-11-01: 50 ug via INTRAVENOUS
  Administered 2016-11-02 – 2016-11-04 (×5): 100 ug via INTRAVENOUS
  Administered 2016-11-05 (×2): 50 ug via INTRAVENOUS
  Filled 2016-11-01 (×10): qty 2

## 2016-11-01 NOTE — Progress Notes (Signed)
Progress Note  Patient Name: Omar Lambert Date of Encounter: 11/01/2016  Primary Cardiologist: Dr. Claiborne Billings   Subjective   Pt opens eyes to touch and w/draws to pain. Last Versed given was 2 mg bolus, 09/26 at noon. No other sedation since then.   Inpatient Medications    Scheduled Meds: . chlorhexidine gluconate (MEDLINE KIT)  15 mL Mouth Rinse BID  . Chlorhexidine Gluconate Cloth  6 each Topical Daily  . feeding supplement (PRO-STAT SUGAR FREE 64)  60 mL Per Tube BID  . feeding supplement (VITAL HIGH PROTEIN)  1,000 mL Per Tube Q24H  . heparin  5,000 Units Subcutaneous Q8H  . insulin aspart  0-15 Units Subcutaneous Q4H  . levothyroxine  175 mcg Oral QAC breakfast  . mouth rinse  15 mL Mouth Rinse 10 times per day  . mupirocin ointment  1 application Nasal BID  . pantoprazole sodium  40 mg Per Tube Q1200  . sodium chloride flush  10-40 mL Intracatheter Q12H   Continuous Infusions: . sodium chloride Stopped (10/27/16 1515)  . sodium chloride    . sodium chloride 10 mL/hr at 11/01/16 0700  . fentaNYL infusion INTRAVENOUS Stopped (10/30/16 1810)  . heparin 999 mL/hr at 10/31/16 2350  . norepinephrine (LEVOPHED) Adult infusion 4 mcg/min (11/01/16 0700)  . piperacillin-tazobactam (ZOSYN)  IV Stopped (11/01/16 0631)  . dialysis replacement fluid (prismasate) 300 mL/hr at 10/31/16 2121  . dialysis replacement fluid (prismasate) 300 mL/hr at 10/31/16 2136  . dialysate (PRISMASATE) 1,500 mL/hr at 11/01/16 0518   PRN Meds: sodium chloride, sodium chloride, fentaNYL, heparin, heparin, midazolam, sodium chloride flush   Vital Signs    Vitals:   11/01/16 0615 11/01/16 0630 11/01/16 0645 11/01/16 0700  BP:  111/72  95/63  Pulse: 70 69 68 67  Resp: (!) 22 (!) 25 (!) 25 (!) 25  Temp: 98.1 F (36.7 C) 98.1 F (36.7 C) 98.1 F (36.7 C) 97.9 F (36.6 C)  TempSrc:      SpO2: 100% 100% 100% 100%  Weight:      Height:        Intake/Output Summary (Last 24 hours) at 11/01/16  0707 Last data filed at 11/01/16 0700  Gross per 24 hour  Intake          2134.37 ml  Output             4176 ml  Net         -2041.63 ml   Filed Weights   10/30/16 0400 10/31/16 0300 11/01/16 0500  Weight: 230 lb 2.6 oz (104.4 kg) 222 lb 0.1 oz (100.7 kg) 218 lb 0.6 oz (98.9 kg)    Telemetry    SR, PR prolonged at 310 msec, HR 60s-70s  - Personally Reviewed  ECG    10/27/16: Atrial fibrillation.  Rate 69 bpm. - Personally Reviewed  Physical Exam   VS:  BP 95/63   Pulse 67   Temp 97.9 F (36.6 C)   Resp (!) 25   Ht _0  (1.727 m)   Wt 218 lb 0.6 oz (98.9 kg)   SpO2 100%   BMI 33.15 kg/m  , BMI Body mass index is 33.15 kg/m. GENERAL:  Critically-ill appearing, NAD NECK:  U/a to assess 2nd lines and equipment LUNGS: clear anteriorly, on vent HEART:  RRR, S1 & S2 WNL, No S3 or S4, soft murmur, no rub  ABD:  Soft, decreased BS but normal freq/pitch, no bruits/rebound or guarding. No hepatomegaly or splenomegally  EXT: no edema, no clubbing, no cyanosis SKIN:  No rashes or nodules noted NEURO:  Opens eyes to touch an withdraws to pain, extremities not seen to move PSYCH: unable to assess  Labs    Chemistry Recent Labs Lab 10/27/16 0025  10/31/16 0335 10/31/16 1559 11/01/16 0341  NA 142  < > 137 137 138  K 4.9  < > 3.9 3.4* 3.7  CL 100*  < > 102 102 103  CO2 22  < > _0 GLUCOSE 100*  < > 78 109* 103*  BUN 44*  < > 38* 38* 37*  CREATININE 3.96*  < > 3.18* 2.83* 2.66*  CALCIUM 9.7  < > 8.4* 8.5* 8.5*  PROT 6.9  --   --   --   --   ALBUMIN 3.4*  < > 2.5* 2.4* 2.5*  AST 361*  --   --   --   --   ALT 290*  --   --   --   --   ALKPHOS 169*  --   --   --   --   BILITOT 1.9*  --   --   --   --   GFRNONAA 13*  < > 17* 20* 22*  GFRAA 15*  < > 20* 23* 25*  ANIONGAP 20*  < > _1 < > = values in this interval not displayed.   Hematology  Recent Labs Lab 10/30/16 0412 10/31/16 0335 11/01/16 0341  WBC 8.0 6.0 5.4  RBC 3.82* 3.98* 3.79*  HGB 11.8*  12.2* 11.7*  HCT 34.5* 36.6* 35.0*  MCV 90.3 92.0 92.3  MCH 30.9 30.7 30.9  MCHC 34.2 33.3 33.4  RDW 15.2 15.3 15.9*  PLT 130* 132* 152    Cardiac Enzymes  Recent Labs Lab 10/28/16 1141  TROPONINI 2.34*     Recent Labs Lab 10/27/16 0038  TROPIPOC 0.15*      Radiology    Dg Chest Port 1 View  Result Date: 10/31/2016 CLINICAL DATA:  Endotracheal tube.  Respiratory failure EXAM: PORTABLE CHEST 1 VIEW COMPARISON:  Yesterday FINDINGS: Endotracheal tube tip between the clavicular heads and carina. Right IJ central line with tip at the upper SVC. An orogastric tube reaches the stomach at least. Haziness of the lower chest that is likely combination of atelectasis and pleural fluid. No Kerley lines or air bronchograms. No pneumothorax. Large lung volumes. Likely stable heart size, accentuated by rotation. IMPRESSION: 1. Stable positioning of tubes and central line. 2. Layering pleural effusions and presumed atelectasis. Obscured left more than right lower lobes. Electronically Signed   By: Monte Fantasia M.D.   On: 10/31/2016 07:30    Cardiac Studies   Echo 10/27/16: Study Conclusions  - Left ventricle: The cavity size was normal. Wall thickness was   increased in a pattern of moderate LVH. Systolic function was   vigorous. The estimated ejection fraction was in the range of 65%   to 70%. Wall motion was normal; there were no regional wall   motion abnormalities. - Ventricular septum: The contour showed diastolic flattening. - Right ventricle: The cavity size was moderately dilated. - Right atrium: The atrium was moderately dilated. - Tricuspid valve: There was severe regurgitation. - Pulmonary arteries: Systolic pressure was mildly to moderately   increased. PA peak pressure: 38 mm Hg (S).  Lexiscan Myoview 10/23/2016  There was no ST segment deviation noted during stress.  The study is normal.  This is a low  risk study.  The left ventricular ejection fraction is  mildly decreased (45-54%). Nuclear stress EF: 47%.   Patient Profile     76 y.o. male with paroxysmal atrial flutter, hypertension, OSA on CPAP, DM, CKD IV, hypothyroidism, and severe COPD admitted 09/22 with symptomatic bradycardia and cardiogenic shock.  He became bradycardic and unresponsive but reportedly never lost a pulse.  He was intubated and given atropine and epinephrine.   Assessment & Plan    # Atrial fibrillation/flutter: # Symptomatic bradycardia: Maintaining SR, amio pta med, d/c'd  HR normalized No longer on glucagon (09/24), Epi (09/25) Still on norepi at 4 mcg/min Wean as tolerated  # Acute on chronic diastolic heart failure: # Cardiogenic shock: NE down to 4 mcg/min, wean as tolerated CVVHD w/ 2 L out last 24 hr CVP 8 today, procalcitonin was down to 44.93 09/26  # Elevated troponin:  Troponin elevated to 0.15-->2.34 (peak). Neg MV 09/18 (done 2nd fatigue)  No reports of CP Elevated trop likely 2nd shock and ARF on CKD  # Acute on chronic renal failure IV:   Got IV Lasix 160 mg q6 on 09/23-24, responded w/ 4.5 L out Started CVVHD 09/24 At one point was net +8.6 L>>now net -48 cc Pulm edema improved w/ CVVHD    # Fever:  # Leukocytosis:  WBC normalized He was hypothermic 09/24>>Bear hugger>>temp improved   Signed, Barrett, Rhonda, PA-C  11/01/2016, 7:07 AM

## 2016-11-01 NOTE — Progress Notes (Signed)
Subjective:  Remains in ICU on  norepi - good UOP 2600 last 24 hours off lasix. CRRT running well.    Objective Vital signs in last 24 hours: Vitals:   11/01/16 0645 11/01/16 0700 11/01/16 0715 11/01/16 0730  BP:  95/63  95/67  Pulse: 68 67 65 65  Resp: (!) 25 (!) 25 (!) 25 (!) 25  Temp: 98.1 F (36.7 C) 97.9 F (36.6 C) 97.9 F (36.6 C) 97.7 F (36.5 C)  TempSrc:      SpO2: 100% 100% 100% 100%  Weight:      Height:       Weight change: -1.8 kg (-3 lb 15.5 oz)  Intake/Output Summary (Last 24 hours) at 11/01/16 0746 Last data filed at 11/01/16 0700  Gross per 24 hour  Intake          2134.37 ml  Output             4176 ml  Net         -2041.63 ml    Assessment/ Plan: Pt is a 76 y.o. yo male who was admitted on 10/27/2016 with being poorly responsive after eye surgery- has advanced CKD at baseline- now with A on CRF  Assessment/Plan: 1. Renal- non oliguric A on CRF- apparently plans starting to be made for dialysis as OP- pt uremic and in the setting of being critically ill even though no absolute indications I felt it best to initiate dialysis in order for him to overall imporve.  Daughter agreed, s/p vascath and started CRRT late 9/25. Labs and volume status improved, to continue CRRT I think at least another 24 hours since still pressor dependent.  K is good- phos is OK 2. HTN/vol- was overloaded by exam- now much better- responding to lasix and CRRT- response too brisk initially , lasix now stopped - CVP mostly 8-10 - set UF at 50 ccs per hour for now 3. Anemia- hgb over 11- no action needed yet     Omar Lambert A    Labs: Basic Metabolic Panel:  Recent Labs Lab 10/31/16 0335 10/31/16 1559 10/31/16 2146 11/01/16 0341  NA 137 137  --  138  K 3.9 3.4*  --  3.7  CL 102 102  --  103  CO2 24 25  --  22  GLUCOSE 78 109*  --  103*  BUN 38* 38*  --  37*  CREATININE 3.18* 2.83*  --  2.66*  CALCIUM 8.4* 8.5*  --  8.5*  PHOS 2.1*  2.1* 2.6 2.6 3.1  3.1    Liver Function Tests:  Recent Labs Lab 10/27/16 0025  10/31/16 0335 10/31/16 1559 11/01/16 0341  AST 361*  --   --   --   --   ALT 290*  --   --   --   --   ALKPHOS 169*  --   --   --   --   BILITOT 1.9*  --   --   --   --   PROT 6.9  --   --   --   --   ALBUMIN 3.4*  < > 2.5* 2.4* 2.5*  < > = values in this interval not displayed. No results for input(s): LIPASE, AMYLASE in the last 168 hours. No results for input(s): AMMONIA in the last 168 hours. CBC:  Recent Labs Lab 10/27/16 1730 10/28/16 0418 10/29/16 0450 10/30/16 0412 10/31/16 0335 11/01/16 0341  WBC 16.6* 17.5* 11.1* 8.0 6.0 5.4  NEUTROABS 14.8*  --   --   --  4.4 3.9  HGB 11.3* 11.3* 11.4* 11.8* 12.2* 11.7*  HCT 35.6* 34.9* 33.6* 34.5* 36.6* 35.0*  MCV 97.5 94.8 90.6 90.3 92.0 92.3  PLT 124* 104* 116* 130* 132* 152   Cardiac Enzymes:  Recent Labs Lab 10/28/16 1141  TROPONINI 2.34*   CBG:  Recent Labs Lab 10/31/16 1142 10/31/16 1517 10/31/16 2017 10/31/16 2328 11/01/16 0352  GLUCAP 96 97 85 111* 101*    Iron Studies: No results for input(s): IRON, TIBC, TRANSFERRIN, FERRITIN in the last 72 hours. Studies/Results: Dg Chest Port 1 View  Result Date: 10/31/2016 CLINICAL DATA:  Endotracheal tube.  Respiratory failure EXAM: PORTABLE CHEST 1 VIEW COMPARISON:  Yesterday FINDINGS: Endotracheal tube tip between the clavicular heads and carina. Right IJ central line with tip at the upper SVC. An orogastric tube reaches the stomach at least. Haziness of the lower chest that is likely combination of atelectasis and pleural fluid. No Kerley lines or air bronchograms. No pneumothorax. Large lung volumes. Likely stable heart size, accentuated by rotation. IMPRESSION: 1. Stable positioning of tubes and central line. 2. Layering pleural effusions and presumed atelectasis. Obscured left more than right lower lobes. Electronically Signed   By: Monte Fantasia M.D.   On: 10/31/2016 07:30    Medications: Infusions: . sodium chloride Stopped (10/27/16 1515)  . sodium chloride    . sodium chloride 10 mL/hr at 11/01/16 0700  . fentaNYL infusion INTRAVENOUS Stopped (10/30/16 1810)  . heparin 999 mL/hr at 10/31/16 2350  . norepinephrine (LEVOPHED) Adult infusion 4 mcg/min (11/01/16 0700)  . piperacillin-tazobactam (ZOSYN)  IV Stopped (11/01/16 0631)  . dialysis replacement fluid (prismasate) 300 mL/hr at 10/31/16 2121  . dialysis replacement fluid (prismasate) 300 mL/hr at 10/31/16 2136  . dialysate (PRISMASATE) 1,500 mL/hr at 11/01/16 0518    Scheduled Medications: . chlorhexidine gluconate (MEDLINE KIT)  15 mL Mouth Rinse BID  . Chlorhexidine Gluconate Cloth  6 each Topical Daily  . feeding supplement (PRO-STAT SUGAR FREE 64)  60 mL Per Tube BID  . feeding supplement (VITAL HIGH PROTEIN)  1,000 mL Per Tube Q24H  . heparin  5,000 Units Subcutaneous Q8H  . insulin aspart  0-15 Units Subcutaneous Q4H  . levothyroxine  175 mcg Oral QAC breakfast  . mouth rinse  15 mL Mouth Rinse 10 times per day  . mupirocin ointment  1 application Nasal BID  . pantoprazole sodium  40 mg Per Tube Q1200  . sodium chloride flush  10-40 mL Intracatheter Q12H    have reviewed scheduled and prn medications.  Physical Exam: General: a little more alert today on vent Heart: RRR Lungs: on vent- CBS Abdomen: obese, soft, non tender Extremities: pitting edema but better  Dialysis Access: vascath placed 9/24    11/01/2016,7:46 AM  LOS: 5 days

## 2016-11-01 NOTE — Progress Notes (Signed)
[facility] called, spoke with Dr. Alva Garnet regarding pt's ongoing loose/watery stools since TFs started 9/25. Gave order to insert rectal tube. Testing for C.diff not indicated at this time - known cause for loose stools.  Will continue to monitor.

## 2016-11-01 NOTE — Progress Notes (Signed)
Name: Omar Lambert MRN: [id number] DOB: December 07, 1940    ADMISSION DATE:  10/27/2016 CONSULTATION DATE: 10/27/16  REFERRING MD : ER ATTENDING DELO  CHIEF COMPLAINT:  BRADYCARDIA, UNRESPONSIVE  BRIEF PATIENT DESCRIPTION: 76 YR OLD MALE W/ PMHX CHF, CKD STAGE III/IV ( seen by Nephro  and discussing possible dialysis), Afib RVR, A flutter, DM presents after opthalmic surgery on 9/21 AM. Post surgery wife found him unresponsive at home at midnight. Bradycardic and unresponsive never lost pulse. Intubated, given atropine, started on Epinephrine, BP 65Y systolic highest Hr in 65K. PCCM consulted   EKG - atrial fibrillation with slow ventricular response with RBBB low QRS voltage  ECHO  08/18/2015>> nml LVSF ,RVSP 40  Stress test nuclear lexiscan 10/23/2016  This is a low risk study.  Nuclear stress EF: 47%.   STUDIES:  CT HEAD 9/22>>> Negative    SIGNIFICANT EVENTS  ETT 9/23>> RIGHT IJ CVC 9/23>> A-LINE 9/23 >> 9/24 -  paralyzed, sedated.  Remains on epi gtt r/t bradycardia, levophed 4mcg.  9/24 CRRT   SUBJECTIVE/OVERNIGHT/INTERVAL HX Off sedation since 9/25 - starting to wake slowly.  Wiggles extremities and opens eyes to command.  No acute change overnight. On CVVHD, pulling 54ml/hr.    VITAL SIGNS: Temp:  [96.4 F (35.8 C)-99.2 F (37.3 C)] 97.3 F (36.3 C) (09/27 0900) Pulse Rate:  [61-76] 66 (09/27 0813) Resp:  [20-70] 25 (09/27 0900) BP: (76-137)/(51-76) 95/64 (09/27 0900) SpO2:  [88 %-100 %] 100 % (09/27 0813) Arterial Line BP: (88-146)/(45-70) 130/57 (09/27 0900) FiO2 (%):  [40 %-50 %] 50 % (09/27 0813) Weight:  [98.9 kg (218 lb 0.6 oz)] 98.9 kg (218 lb 0.6 oz) (09/27 0500)        Gen. Chr ill appearing, well-nourished, in no distress ENT - rt eye bandage, oral ETT Neck: No JVD, no thyromegaly, no carotid bruits Lungs:  resps even non labored on vent, essentially clear  Cardiovascular: Rhythm regular, heart sounds  normal, no murmurs, no peripheral  edema Abdomen: soft and non-tender, no hepatosplenomegaly, BS normal. Musculoskeletal: No deformities, no cyanosis or clubbing Neuro: slow to respond but does Wiggles extremities and opens eyes to command. Profoundly weak.  Skin:  Warm, no rash , ana sarca , left foot wound with sero sangdrainage  PULMONARY  Recent Labs Lab 10/27/16 1852 10/28/16 0431 10/28/16 0957 10/28/16 1042 10/28/16 1213 10/31/16 1116  PHART 7.482* 7.457* 7.243* 7.205* 7.483* 7.552*  PCO2ART 33.7 31.8* 56.7* 56.7* 29.4* 27.3*  PO2ART 80.0* 102.0 108.0 78.0* 224.0* 160*  HCO3 25.0 22.2 24.5 22.6 22.1 24.0  TCO2 26 23 26 24 23   --   O2SAT 96.0 98.0 97.0 92.0 100.0 99.0    CBC  Recent Labs Lab 10/30/16 0412 10/31/16 0335 11/01/16 0341  HGB 11.8* 12.2* 11.7*  HCT 34.5* 36.6* 35.0*  WBC 8.0 6.0 5.4  PLT 130* 132* 152    COAGULATION  Recent Labs Lab 10/27/16 0025  INR 2.03    CARDIAC    Recent Labs Lab 10/28/16 1141  TROPONINI 2.34*   No results for input(s): PROBNP in the last 168 hours.   CHEMISTRY  Recent Labs Lab 10/29/16 0450  10/30/16 0412 10/30/16 1539 10/31/16 0335 10/31/16 1559 10/31/16 2146 11/01/16 0341  NA 133*  < > 133* 134* 137 137  --  138  K 4.0  < > 4.2 4.3 3.9 3.4*  --  3.7  CL 98*  < > 98* 101 102 102  --  103  CO2 21*  < >  23 22 24 25   --  22  GLUCOSE 124*  < > 110* 71 78 109*  --  103*  BUN 79*  < > 58* 44* 38* 38*  --  37*  CREATININE 6.50*  < > 4.87* 3.98* 3.18* 2.83*  --  2.66*  CALCIUM 8.0*  < > 8.1* 8.2* 8.4* 8.5*  --  8.5*  MG 1.7  --  2.1  --  2.1  --  2.4 2.4  PHOS 4.5  < > 3.7 3.0 2.1*  2.1* 2.6 2.6 3.1  3.1  < > = values in this interval not displayed. Estimated Creatinine Clearance: 26.5 mL/min (A) (by C-G formula based on SCr of 2.66 mg/dL (H)).   LIVER  Recent Labs Lab 10/27/16 0025  10/30/16 0412 10/30/16 1539 10/31/16 0335 10/31/16 1559 11/01/16 0341  AST 361*  --   --   --   --   --   --   ALT 290*  --   --   --   --   --    --   ALKPHOS 169*  --   --   --   --   --   --   BILITOT 1.9*  --   --   --   --   --   --   PROT 6.9  --   --   --   --   --   --   ALBUMIN 3.4*  < > 2.5* 2.5* 2.5* 2.4* 2.5*  INR 2.03  --   --   --   --   --   --   < > = values in this interval not displayed.   INFECTIOUS  Recent Labs Lab 10/27/16 0400 10/27/16 1715 10/27/16 2318 10/29/16 0939 10/30/16 0412 10/31/16 0335  LATICACIDVEN 11.4* 2.9* 2.3*  --   --   --   PROCALCITON  --   --   --  93.09 61.68 44.93     ENDOCRINE CBG (last 3)   Recent Labs  10/31/16 2328 11/01/16 0352 11/01/16 0742  GLUCAP 111* 101* 91         IMAGING x48h  - image(s) personally visualized  -   highlighted in bold Dg Chest Port 1 View  Result Date: 11/01/2016 CLINICAL DATA:  Acute respiratory failure EXAM: PORTABLE CHEST 1 VIEW COMPARISON:  10/31/2016 FINDINGS: Cardiomegaly with vascular congestion. Layering bilateral effusions with bibasilar atelectasis, similar to prior study. Mild interstitial prominence could reflect interstitial edema. IMPRESSION: Probable mild interstitial edema. Continued layering bilateral effusions with bibasilar atelectasis. Electronically Signed   By: Rolm Baptise M.D.   On: 11/01/2016 07:59   Dg Chest Port 1 View  Result Date: 10/31/2016 CLINICAL DATA:  Endotracheal tube.  Respiratory failure EXAM: PORTABLE CHEST 1 VIEW COMPARISON:  Yesterday FINDINGS: Endotracheal tube tip between the clavicular heads and carina. Right IJ central line with tip at the upper SVC. An orogastric tube reaches the stomach at least. Haziness of the lower chest that is likely combination of atelectasis and pleural fluid. No Kerley lines or air bronchograms. No pneumothorax. Large lung volumes. Likely stable heart size, accentuated by rotation. IMPRESSION: 1. Stable positioning of tubes and central line. 2. Layering pleural effusions and presumed atelectasis. Obscured left more than right lower lobes. Electronically Signed   By:  Monte Fantasia M.D.   On: 10/31/2016 07:30     ASSESSMENT / PLAN:  PULMONARY A: Acute resp failure with ? ALI physiology S/p nimbex x 48h  P:   Vent support - 8cc/kg  F/u CXR  F/u ABG Volume removal with CVVHD per renal  SBT in am    CARDIOVASCULAR A:  #admitted with bradycardia following timolol eye drop circulatory shock - improved  H/o AFib on apixaban  P:  MAP goal > 85 Amio on hold Remains on low dose levophed - weaning off  will need to start IV heparin at some point when OK with eye Sx  RENAL Acute on CKD IV  P:   CVVHD per renal  F/u chem   GASTROINTESTINAL A:   NPO  P:   TF PPI  HEMATOLOGIC A:   #RBC: mild anemia critical illness #Platelet mild thrombocytopenia - improving #WBC lekyocytosis -resolved  P:  F/u CBC  SCD's   INFECTIOUS  A:   MSSA PCR + and PCT c/w Septic shock - likely aspiration pneumonia PLAN -  Continue zosyn  Trend pct    ENDOCRINE A:   DM-2 Hypothyroid P:   ICU hyperglycemia protocol Resume synthroid - TSH 2.561  NEUROLOGIC A:   Acute encephalopathy noted post admission - improving slowly. Follows commands 9/27 Profound weakness after NMB   P:   RASS goal: 0 Supportive care  Avoid sedation    FAMILY  - Updates: wife updated at bedside 9/27  - Inter-disciplinary family meet or Palliative Care meeting due by:  DAy [place], NP 11/01/2016  9:52 AM Pager: [phone] or [phone]

## 2016-11-02 ENCOUNTER — Inpatient Hospital Stay (HOSPITAL_COMMUNITY): Payer: Medicare HMO

## 2016-11-02 LAB — GLUCOSE, CAPILLARY
GLUCOSE-CAPILLARY: 85 mg/dL (ref 65–99)
GLUCOSE-CAPILLARY: 114 mg/dL — AB (ref 65–99)
Glucose-Capillary: 97 mg/dL (ref 65–99)
Glucose-Capillary: 92 mg/dL (ref 65–99)
Glucose-Capillary: 107 mg/dL — ABNORMAL HIGH (ref 65–99)
Glucose-Capillary: 90 mg/dL (ref 65–99)

## 2016-11-02 LAB — CBC WITH DIFFERENTIAL/PLATELET
BASOS PCT: 0 %
Basophils Absolute: 0 10*3/uL (ref 0.0–0.1)
EOS ABS: 0.2 10*3/uL (ref 0.0–0.7)
EOS PCT: 4 %
HCT: 35.6 % — ABNORMAL LOW (ref 39.0–52.0)
Hemoglobin: 11.6 g/dL — ABNORMAL LOW (ref 13.0–17.0)
Lymphocytes Relative: 14 %
Lymphs Abs: 0.8 10*3/uL (ref 0.7–4.0)
MCH: 30.8 pg (ref 26.0–34.0)
MCHC: 32.6 g/dL (ref 30.0–36.0)
MCV: 94.4 fL (ref 78.0–100.0)
MONO ABS: 0.4 10*3/uL (ref 0.1–1.0)
MONOS PCT: 7 %
NEUTROS PCT: 75 %
Neutro Abs: 4.5 10*3/uL (ref 1.7–7.7)
PLATELETS: 144 10*3/uL — AB (ref 150–400)
RBC: 3.77 MIL/uL — ABNORMAL LOW (ref 4.22–5.81)
RDW: 16 % — AB (ref 11.5–15.5)
WBC: 5.9 10*3/uL (ref 4.0–10.5)

## 2016-11-02 LAB — RENAL FUNCTION PANEL
ALBUMIN: 2.7 g/dL — AB (ref 3.5–5.0)
ANION GAP: 9 (ref 5–15)
BUN: 33 mg/dL — AB (ref 6–20)
CALCIUM: 8.7 mg/dL — AB (ref 8.9–10.3)
CO2: 27 mmol/L (ref 22–32)
CREATININE: 2.42 mg/dL — AB (ref 0.61–1.24)
Chloride: 103 mmol/L (ref 101–111)
GFR calc Af Amer: 28 mL/min — ABNORMAL LOW (ref >60)
GFR calc non Af Amer: 24 mL/min — ABNORMAL LOW (ref >60)
GLUCOSE: 89 mg/dL (ref 65–99)
PHOSPHORUS: 3.2 mg/dL (ref 2.5–4.6)
Potassium: 3.9 mmol/L (ref 3.5–5.1)
SODIUM: 139 mmol/L (ref 135–145)

## 2016-11-02 LAB — STREP PNEUMONIAE URINARY ANTIGEN: Strep Pneumo Urinary Antigen: NEGATIVE

## 2016-11-02 LAB — MAGNESIUM: Magnesium: 2.3 mg/dL (ref 1.7–2.4)

## 2016-11-02 NOTE — Progress Notes (Signed)
Progress Note  Patient Name: Omar Lambert Date of Encounter: 11/02/2016  Primary Cardiologist: Dr. Claiborne Billings   Subjective   Unable to obtain. Intubated.  Not responding to questions.   Inpatient Medications    Scheduled Meds: . chlorhexidine gluconate (MEDLINE KIT)  15 mL Mouth Rinse BID  . Chlorhexidine Gluconate Cloth  6 each Topical Daily  . feeding supplement (PRO-STAT SUGAR FREE 64)  60 mL Per Tube BID  . feeding supplement (VITAL HIGH PROTEIN)  1,000 mL Per Tube Q24H  . heparin  5,000 Units Subcutaneous Q8H  . insulin aspart  0-15 Units Subcutaneous Q4H  . levothyroxine  175 mcg Oral QAC breakfast  . mouth rinse  15 mL Mouth Rinse 10 times per day  . mupirocin ointment  1 application Nasal BID  . pantoprazole sodium  40 mg Per Tube Q1200  . sodium chloride flush  10-40 mL Intracatheter Q12H   Continuous Infusions: . sodium chloride Stopped (10/27/16 1515)  . sodium chloride    . sodium chloride Stopped (11/01/16 1942)  . heparin 999 mL/hr at 10/31/16 2350  . norepinephrine (LEVOPHED) Adult infusion Stopped (11/01/16 1942)  . piperacillin-tazobactam (ZOSYN)  IV Stopped (11/02/16 0531)   PRN Meds: sodium chloride, sodium chloride, fentaNYL (SUBLIMAZE) injection, heparin, heparin, sodium chloride flush   Vital Signs    Vitals:   11/02/16 0400 11/02/16 0600 11/02/16 0700 11/02/16 0800  BP: 131/79 (!) 168/122 107/70 106/74  Pulse: 72 90 70 70  Resp: (!) 23 (!) 24 (!) 25 (!) 25  Temp: 98.2 F (36.8 C) 98.2 F (36.8 C) 98.4 F (36.9 C) 98.4 F (36.9 C)  TempSrc: Core (Comment)   Core (Comment)  SpO2: 100% 100% 100% 100%  Weight:      Height:        Intake/Output Summary (Last 24 hours) at 11/02/16 0902 Last data filed at 11/02/16 0800  Gross per 24 hour  Intake          1418.34 ml  Output             3067 ml  Net         -1648.66 ml   Filed Weights   10/31/16 0300 11/01/16 0500 11/02/16 0231  Weight: 100.7 kg (222 lb 0.1 oz) 98.9 kg (218 lb 0.6 oz) 94.9  kg (209 lb 3.5 oz)    Telemetry    Sinus rhythm.  Dropped beats.  - Personally Reviewed  ECG    10/28/11: Atrial fibrillation.  Rate 69 bpm. - Personally Reviewed  Physical Exam   VS:  BP 106/74 (BP Location: Left Arm)   Pulse 70   Temp 98.4 F (36.9 C) (Core (Comment))   Resp (!) 25   Ht _0  (1.727 m)   Wt 94.9 kg (209 lb 3.5 oz)   SpO2 100%   BMI 31.81 kg/m  , BMI Body mass index is 31.81 kg/m. GENERAL:  Critically ill-appearing.  No acute distress. NECK:  Unable to assess JVD 2/2 lines LUNGS:  Vented breath sounds on anterior exam.  No crackles, wheezes or rhonchi HEART:  RRR.  PMI not displaced or sustained,S1 and S2 within normal limits, no S3, no S4, no clicks, no rubs, no murmurs ABD:  Soft, positive bowel sounds normal in frequency in pitch, no bruits, no rebound, no guarding, no midline pulsatile mass, no hepatomegaly, no splenomegaly EXT: no edema, no cyanosis no clubbing SKIN:  No rashes no nodules NEURO:  Wiggles toes to command.  Moves all  four extremities.  Opens eyes and turns heat to voice. PSYCH:  Unable to assess.    Labs    Chemistry Recent Labs Lab 10/27/16 0025  11/01/16 0341 11/01/16 1622 11/02/16 0251  NA 142  < > 138 138 139  K 4.9  < > 3.7 4.0 3.9  CL 100*  < > 103 104 103  CO2 22  < > _0 GLUCOSE 100*  < > 103* 102* 89  BUN 44*  < > 37* 35* 33*  CREATININE 3.96*  < > 2.66* 2.51* 2.42*  CALCIUM 9.7  < > 8.5* 8.6* 8.7*  PROT 6.9  --   --   --   --   ALBUMIN 3.4*  < > 2.5* 2.5* 2.7*  AST 361*  --   --   --   --   ALT 290*  --   --   --   --   ALKPHOS 169*  --   --   --   --   BILITOT 1.9*  --   --   --   --   GFRNONAA 13*  < > 22* 23* 24*  GFRAA 15*  < > 25* 27* 28*  ANIONGAP 20*  < > _1 < > = values in this interval not displayed.   Hematology  Recent Labs Lab 10/31/16 0335 11/01/16 0341 11/02/16 0251  WBC 6.0 5.4 5.9  RBC 3.98* 3.79* 3.77*  HGB 12.2* 11.7* 11.6*  HCT 36.6* 35.0* 35.6*  MCV 92.0 92.3 94.4    MCH 30.7 30.9 30.8  MCHC 33.3 33.4 32.6  RDW 15.3 15.9* 16.0*  PLT 132* 152 144*    Cardiac Enzymes  Recent Labs Lab 10/28/16 1141  TROPONINI 2.34*     Recent Labs Lab 10/27/16 0038  TROPIPOC 0.15*     BNPNo results for input(s): BNP, PROBNP in the last 168 hours.   DDimer No results for input(s): DDIMER in the last 168 hours.   Radiology    Dg Chest Port 1 View  Result Date: 11/01/2016 CLINICAL DATA:  Acute respiratory failure EXAM: PORTABLE CHEST 1 VIEW COMPARISON:  10/31/2016 FINDINGS: Cardiomegaly with vascular congestion. Layering bilateral effusions with bibasilar atelectasis, similar to prior study. Mild interstitial prominence could reflect interstitial edema. IMPRESSION: Probable mild interstitial edema. Continued layering bilateral effusions with bibasilar atelectasis. Electronically Signed   By: Rolm Baptise M.D.   On: 11/01/2016 07:59    Cardiac Studies   Echo 10/27/16: Study Conclusions  - Left ventricle: The cavity size was normal. Wall thickness was   increased in a pattern of moderate LVH. Systolic function was   vigorous. The estimated ejection fraction was in the range of 65%   to 70%. Wall motion was normal; there were no regional wall   motion abnormalities. - Ventricular septum: The contour showed diastolic flattening. - Right ventricle: The cavity size was moderately dilated. - Right atrium: The atrium was moderately dilated. - Tricuspid valve: There was severe regurgitation. - Pulmonary arteries: Systolic pressure was mildly to moderately   increased. PA peak pressure: 38 mm Hg (S).  Lexiscan Myoview 10/23/2016  There was no ST segment deviation noted during stress.  The study is normal.  This is a low risk study.  The left ventricular ejection fraction is mildly decreased (45-54%). Nuclear stress EF: 47%.   Patient Profile     76 y.o. male with paroxysmal atrial flutter, hypertension, OSA on CPAP, DM, CKD IV, hypothyroidism, and  severe COPD here with symptomatic bradycardia and cardiogenic shock.  He became bradycardic and unresponsive but reportedly never lost a pulse.  He was intubated and given atropine and epinephrine.   Assessment & Plan    # Atrial fibrillation/flutter: # Symptomatic bradycardia: Currently sinus rhythm.  Mr. Okuda was on amiodarone as an outpatient but no other nodal agents.  He is no longer bradycardic.  He did have two dropped beats but no prolonged pauses. He is now off all pressors and glucagon.  He will need at least a 30 day event monitor at discharge if no additional arrhythmias are noted in the hospital.   # Acute on chronic diastolic heart failure: # Cardiogenic shock: Resolved.  CVP 6-7.  He appears euvolemic on exam.  Off all pressors.    # Elevated troponin:  Troponin elevated to 0.15-->2.34.  Patient had a recent negative stress test in the setting of feeing fatigued.  No chest pain.  This is likely 2/2 shock and acute on chronic renal failure.    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  He continues on CVVHD and will transition to HD per nephrology.      Time spent: 30 minutes-Greater than 50% of this time was spent in counseling, explanation of diagnosis, planning of further management, and coordination of care.    For questions or updates, please contact [place] Please consult [email or web address] for contact info under Cardiology/STEMI.      Signed, Skeet Latch, MD  11/02/2016, 9:02 AM

## 2016-11-02 NOTE — Progress Notes (Signed)
Subjective:  Remains in ICU off pressors- UOP lower at 408  last 24 hours off lasix. CRRT running well.    Objective Vital signs in last 24 hours: Vitals:   11/02/16 0400 11/02/16 0600 11/02/16 0700 11/02/16 0800  BP: 131/79 (!) 168/122 107/70 106/74  Pulse: 72 90 70 70  Resp: (!) 23 (!) 24 (!) 25 (!) 25  Temp: 98.2 F (36.8 C) 98.2 F (36.8 C) 98.4 F (36.9 C) 98.4 F (36.9 C)  TempSrc: Core (Comment)   Core (Comment)  SpO2: 100% 100% 100% 100%  Weight:      Height:       Weight change: -4 kg (-8 lb 13.1 oz)  Intake/Output Summary (Last 24 hours) at 11/02/16 0853 Last data filed at 11/02/16 0800  Gross per 24 hour  Intake          1510.78 ml  Output             3185 ml  Net         -1674.22 ml    Assessment/ Plan: Pt is a 76 y.o. yo male who was admitted on 10/27/2016 with being poorly responsive after eye surgery- has advanced CKD at baseline- now with A on CRF  Assessment/Plan: 1. Renal- non oliguric A on CRF- apparently plans starting to be made for dialysis as OP- pt uremic and in the setting of being critically ill even though no absolute indications I felt it best to initiate dialysis in order for him to overall imporve.  Daughter agreed, s/p vascath and started CRRT late 9/25. Labs and volume status improved, now that more stable will stop CRRT-  K is good- phos is OK 2. HTN/vol- was overloaded by exam- now much better- responded to lasix and CRRT- response too brisk initially , lasix now stopped - CVP mostly 6-9 - will stop CRRT but keep lasix stopped as well and see what he can do 3. Anemia- hgb over 11- no action needed yet     Omar Lambert A    Labs: Basic Metabolic Panel:  Recent Labs Lab 11/01/16 0341 11/01/16 1622 11/02/16 0251  NA 138 138 139  K 3.7 4.0 3.9  CL 103 104 103  CO2 '22 25 27  ' GLUCOSE 103* 102* 89  BUN 37* 35* 33*  CREATININE 2.66* 2.51* 2.42*  CALCIUM 8.5* 8.6* 8.7*  PHOS 3.1  3.1 2.6 3.2   Liver Function Tests:  Recent  Labs Lab 10/27/16 0025  11/01/16 0341 11/01/16 1622 11/02/16 0251  AST 361*  --   --   --   --   ALT 290*  --   --   --   --   ALKPHOS 169*  --   --   --   --   BILITOT 1.9*  --   --   --   --   PROT 6.9  --   --   --   --   ALBUMIN 3.4*  < > 2.5* 2.5* 2.7*  < > = values in this interval not displayed. No results for input(s): LIPASE, AMYLASE in the last 168 hours. No results for input(s): AMMONIA in the last 168 hours. CBC:  Recent Labs Lab 10/29/16 0450 10/30/16 0412 10/31/16 0335 11/01/16 0341 11/02/16 0251  WBC 11.1* 8.0 6.0 5.4 5.9  NEUTROABS  --   --  4.4 3.9 4.5  HGB 11.4* 11.8* 12.2* 11.7* 11.6*  HCT 33.6* 34.5* 36.6* 35.0* 35.6*  MCV 90.6 90.3 92.0 92.3 94.4  PLT 116* 130* 132* 152 144*   Cardiac Enzymes:  Recent Labs Lab 10/28/16 1141  TROPONINI 2.34*   CBG:  Recent Labs Lab 11/01/16 1114 11/01/16 1615 11/01/16 1926 11/02/16 0259 11/02/16 0812  GLUCAP 99 94 94 85 97    Iron Studies: No results for input(s): IRON, TIBC, TRANSFERRIN, FERRITIN in the last 72 hours. Studies/Results: Dg Chest Port 1 View  Result Date: 11/01/2016 CLINICAL DATA:  Acute respiratory failure EXAM: PORTABLE CHEST 1 VIEW COMPARISON:  10/31/2016 FINDINGS: Cardiomegaly with vascular congestion. Layering bilateral effusions with bibasilar atelectasis, similar to prior study. Mild interstitial prominence could reflect interstitial edema. IMPRESSION: Probable mild interstitial edema. Continued layering bilateral effusions with bibasilar atelectasis. Electronically Signed   By: Rolm Baptise M.D.   On: 11/01/2016 07:59   Medications: Infusions: . sodium chloride Stopped (10/27/16 1515)  . sodium chloride    . sodium chloride Stopped (11/01/16 1942)  . heparin 999 mL/hr at 10/31/16 2350  . norepinephrine (LEVOPHED) Adult infusion Stopped (11/01/16 1942)  . piperacillin-tazobactam (ZOSYN)  IV Stopped (11/02/16 0531)  . dialysis replacement fluid (prismasate) 300 mL/hr at 11/02/16  0814  . dialysis replacement fluid (prismasate) 300 mL/hr at 11/02/16 0845  . dialysate (PRISMASATE) 1,500 mL/hr at 11/02/16 0825    Scheduled Medications: . chlorhexidine gluconate (MEDLINE KIT)  15 mL Mouth Rinse BID  . Chlorhexidine Gluconate Cloth  6 each Topical Daily  . feeding supplement (PRO-STAT SUGAR FREE 64)  60 mL Per Tube BID  . feeding supplement (VITAL HIGH PROTEIN)  1,000 mL Per Tube Q24H  . heparin  5,000 Units Subcutaneous Q8H  . insulin aspart  0-15 Units Subcutaneous Q4H  . levothyroxine  175 mcg Oral QAC breakfast  . mouth rinse  15 mL Mouth Rinse 10 times per day  . mupirocin ointment  1 application Nasal BID  . pantoprazole sodium  40 mg Per Tube Q1200  . sodium chloride flush  10-40 mL Intracatheter Q12H    have reviewed scheduled and prn medications.  Physical Exam: General: a little more alert today on vent Heart: RRR Lungs: on vent- CBS Abdomen: obese, soft, non tender Extremities: pitting edema but better  Dialysis Access: vascath placed 9/24    11/02/2016,8:53 AM  LOS: 6 days

## 2016-11-02 NOTE — Progress Notes (Signed)
Patient's wife spoke with patients eye doctor office and was advised it is okay to take off the eye dressing off and keep the dressing off as there are no stitches on the patient's eye.  Patient to avoid sunlight.  Per patient's wife, she was told by eye doctor that it would be about a month until patient would be able to open his right eye lid.  Dressing removed.

## 2016-11-02 NOTE — Progress Notes (Signed)
Name: Omar Lambert MRN: [id number] DOB: 1940/12/15    ADMISSION DATE:  10/27/2016 CONSULTATION DATE: 10/27/16  REFERRING MD : ER ATTENDING DELO  CHIEF COMPLAINT:  BRADYCARDIA, UNRESPONSIVE  BRIEF PATIENT DESCRIPTION: 76 YR OLD MALE W/ PMHX CHF, CKD STAGE III/IV ( seen by Nephro  and discussing possible dialysis), Afib RVR, A flutter, DM presents after opthalmic surgery on 9/21 AM. Post surgery wife found him unresponsive at home at midnight. Bradycardic and unresponsive never lost pulse. Intubated, given atropine, started on Epinephrine, BP 71G systolic highest Hr in 62I. PCCM consulted   EKG - atrial fibrillation with slow ventricular response with RBBB low QRS voltage  ECHO  08/18/2015>> nml LVSF ,RVSP 40  Stress test nuclear lexiscan 10/23/2016  This is a low risk study.  Nuclear stress EF: 47%.   STUDIES:  CT HEAD 9/22>>> Negative    SIGNIFICANT EVENTS  ETT 9/23>> RIGHT IJ CVC 9/23>> A-LINE 9/23 >>9/28 9/24 -  paralyzed, sedated.  Remains on epi gtt r/t bradycardia, levophed 15mcg.  9/24 CRRT  Off sedation since 9/25 - starting to wake slowly.  Wiggles extremities and opens eyes to command.  No acute change overnight. On CVVHD, pulling 46ml/hr.    SUBJECTIVE/OVERNIGHT/INTERVAL HX 9/28 - coming off CRRT today. Off lasix gtt. Making urine. Off pressors since yesterday. Rt eye bandaid off. Cards recmmending event monitor for 30day from date of discharge even if no additiional arrythmias noted. Currently sinus. Mental status slowly improving - wiggled toes bialterally to command and had movement on uppers though slightly less purposeful on right   VITAL SIGNS: Temp:  [97.3 F (36.3 C)-99 F (37.2 C)] 98.2 F (36.8 C) (09/28 0900) Pulse Rate:  [62-90] 71 (09/28 0900) Resp:  [20-25] 25 (09/28 0900) BP: (83-168)/(56-122) 106/74 (09/28 0800) SpO2:  [96 %-100 %] 100 % (09/28 0900) Arterial Line BP: (95-209)/(43-86) 132/58 (09/28 0900) FiO2 (%):  [40 %] 40 % (09/28  0817) Weight:  [94.9 kg (209 lb 3.5 oz)] 94.9 kg (209 lb 3.5 oz) (09/28 0231)       General Appearance:    Looks criticall ill but looking better  Head:    Normocephalic, without obvious abnormality, atraumatic  Eyes:    PERRL - Rt eye closed ,. Left PEERL +, conjunctiva/corneas - clear on left      Ears:    Normal external ear canals, both ears  Nose:   NG tube - no  Throat:  ETT TUBE - yes , OG tube - yes  Neck:   Supple,  No enlargement/tenderness/nodules     Lungs:     Clear to auscultation bilaterally, Ventilator   Synchrony - yes  Chest wall:    No deformity  Heart:    S1 and S2 normal, no murmur, CVP - no.  Pressors - no  Abdomen:     Soft, no masses, no organomegaly  Genitalia:    Not done  Rectal:   not done  Extremities:   Extremities- intact     Skin:   Intact in exposed areas . Sacral area - no reporte of decube     Neurologic:   Sedation - off sedation since 9/25 -> RASS - -3 . Moves all 4s - with squeese. CAM-ICU - unable to assess . Orientation - not oriented     PULMONARY  Recent Labs Lab 10/27/16 1852 10/28/16 0431 10/28/16 0957 10/28/16 1042 10/28/16 1213 10/31/16 1116  PHART 7.482* 7.457* 7.243* 7.205* 7.483* 7.552*  PCO2ART 33.7 31.8* 56.7* 56.7*  29.4* 27.3*  PO2ART 80.0* 102.0 108.0 78.0* 224.0* 160*  HCO3 25.0 22.2 24.5 22.6 22.1 24.0  TCO2 26 23 26 24 23   --   O2SAT 96.0 98.0 97.0 92.0 100.0 99.0    CBC  Recent Labs Lab 10/31/16 0335 11/01/16 0341 11/02/16 0251  HGB 12.2* 11.7* 11.6*  HCT 36.6* 35.0* 35.6*  WBC 6.0 5.4 5.9  PLT 132* 152 144*    COAGULATION  Recent Labs Lab 10/27/16 0025  INR 2.03    CARDIAC   Recent Labs Lab 10/28/16 1141  TROPONINI 2.34*   No results for input(s): PROBNP in the last 168 hours.   CHEMISTRY  Recent Labs Lab 10/31/16 0335 10/31/16 1559 10/31/16 2146 11/01/16 0341 11/01/16 1622 11/01/16 1628 11/02/16 0251  NA 137 137  --  138 138  --  139  K 3.9 3.4*  --  3.7 4.0  --  3.9    CL 102 102  --  103 104  --  103  CO2 24 25  --  22 25  --  27  GLUCOSE 78 109*  --  103* 102*  --  89  BUN 38* 38*  --  37* 35*  --  33*  CREATININE 3.18* 2.83*  --  2.66* 2.51*  --  2.42*  CALCIUM 8.4* 8.5*  --  8.5* 8.6*  --  8.7*  MG 2.1  --  2.4 2.4  --  2.3 2.3  PHOS 2.1*  2.1* 2.6 2.6 3.1  3.1 2.6  --  3.2   Estimated Creatinine Clearance: 28.6 mL/min (A) (by C-G formula based on SCr of 2.42 mg/dL (H)).   LIVER  Recent Labs Lab 10/27/16 0025  10/31/16 0335 10/31/16 1559 11/01/16 0341 11/01/16 1622 11/02/16 0251  AST 361*  --   --   --   --   --   --   ALT 290*  --   --   --   --   --   --   ALKPHOS 169*  --   --   --   --   --   --   BILITOT 1.9*  --   --   --   --   --   --   PROT 6.9  --   --   --   --   --   --   ALBUMIN 3.4*  < > 2.5* 2.4* 2.5* 2.5* 2.7*  INR 2.03  --   --   --   --   --   --   < > = values in this interval not displayed.   INFECTIOUS  Recent Labs Lab 10/27/16 0400 10/27/16 1715 10/27/16 2318 10/29/16 0939 10/30/16 0412 10/31/16 0335  LATICACIDVEN 11.4* 2.9* 2.3*  --   --   --   PROCALCITON  --   --   --  93.09 61.68 44.93     ENDOCRINE CBG (last 3)   Recent Labs  11/01/16 1926 11/02/16 0259 11/02/16 0812  GLUCAP 94 85 97         IMAGING x48h  - image(s) personally visualized  -   highlighted in bold Dg Chest Port 1 View  Result Date: 11/01/2016 CLINICAL DATA:  Acute respiratory failure EXAM: PORTABLE CHEST 1 VIEW COMPARISON:  10/31/2016 FINDINGS: Cardiomegaly with vascular congestion. Layering bilateral effusions with bibasilar atelectasis, similar to prior study. Mild interstitial prominence could reflect interstitial edema. IMPRESSION: Probable mild interstitial edema. Continued layering bilateral effusions with bibasilar atelectasis. Electronically  Signed   By: Rolm Baptise M.D.   On: 11/01/2016 07:59        ASSESSMENT / PLAN:  PULMONARY A: Acute resp failure with ? ALI physiology S/p nimbex x  48h  11/02/2016 - > does NOT  meet criteria for SBT/Extubation in setting of Acute Respiratory Failure due to acute encephalopathy   P:   Vent support - 8cc/kg  vap protocol No extubation   CARDIOVASCULAR A:  H/o AFib on apixaban and amio admitted with bradycardia following timolol eye drop 10/27/2016 needing pressors  On 11/02/2016 - off all pressors and in sinus    P:  MAP goal > 85 Amio on hold Cards to comment on anticoagulation     ENAL Acute on CKD IV   11/02/2016 - coming off CRRT  11/02/2016 pe renal. Alrady off lasix    P:   F/u chem  Monitor  GASTROINTESTINAL A:   NPO  P:   TF PPI  HEMATOLOGIC A:   #RBC: mild anemia critical illness #Platelet mild thrombocytopenia - improving #WBC lekyocytosis -resolved  P:  F/u CBC  SCD's   INFECTIOUS  A:   MSSA PCR + and PCT c/w Septic shock - likely aspiration pneumonia  11/02/2016 - off pressors  PLAN -  Continue zosyn  Trend pct  Check urine strep and legionella   ENDOCRINE A:   DM-2 Hypothyroid P:   ICU hyperglycemia protocol Resume synthroid - TSH 2.561  NEUROLOGIC A:   Acute encephalopathy noted post admission - improving slowly.  CT head 10/27/2016 non contributor   - improved follows commands 9/28 (off sedation since 10/27/16)   P:   RASS goal: 0 Supportive care  Avoid sedation     FAMILY  - Updates: wife updated at bedside 9/28 and every day prior to that by PCCM MD  - Inter-disciplinary family meet or Palliative Care meeting due by:  DAy 7    The patient is critically ill with multiple organ systems failure and requires high complexity decision making for assessment and support, frequent evaluation and titration of therapies, application of advanced monitoring technologies and extensive interpretation of multiple databases.   Critical Care Time devoted to patient care services described in this note is  30  Minutes. This time reflects time of care of this signee Dr Brand Males. This critical care time does not reflect procedure time, or teaching time or supervisory time of PA/NP/Med student/Med Resident etc but could involve care discussion time    Dr. Brand Males, M.D., [facility].C.P Pulmonary and Critical Care Medicine Staff Physician [facility] Pager: [phone], If no answer or between  15:00h - 7:00h: call [phone]  11/02/2016 10:03 AM

## 2016-11-03 LAB — GLUCOSE, CAPILLARY
GLUCOSE-CAPILLARY: 84 mg/dL (ref 65–99)
GLUCOSE-CAPILLARY: 95 mg/dL (ref 65–99)
GLUCOSE-CAPILLARY: 105 mg/dL — AB (ref 65–99)
Glucose-Capillary: 101 mg/dL — ABNORMAL HIGH (ref 65–99)
Glucose-Capillary: 105 mg/dL — ABNORMAL HIGH (ref 65–99)
Glucose-Capillary: 89 mg/dL (ref 65–99)
Glucose-Capillary: 96 mg/dL (ref 65–99)

## 2016-11-03 LAB — CBC WITH DIFFERENTIAL/PLATELET
BASOS ABS: 0 10*3/uL (ref 0.0–0.1)
Basophils Relative: 0 %
EOS PCT: 2 %
Eosinophils Absolute: 0.1 10*3/uL (ref 0.0–0.7)
HEMATOCRIT: 35.1 % — AB (ref 39.0–52.0)
Hemoglobin: 11.5 g/dL — ABNORMAL LOW (ref 13.0–17.0)
LYMPHS ABS: 0.7 10*3/uL (ref 0.7–4.0)
LYMPHS PCT: 9 %
MCH: 30.7 pg (ref 26.0–34.0)
MCHC: 32.8 g/dL (ref 30.0–36.0)
MCV: 93.9 fL (ref 78.0–100.0)
MONO ABS: 1.2 10*3/uL — AB (ref 0.1–1.0)
MONOS PCT: 15 %
NEUTROS ABS: 5.6 10*3/uL (ref 1.7–7.7)
Neutrophils Relative %: 74 %
PLATELETS: 171 10*3/uL (ref 150–400)
RBC: 3.74 MIL/uL — ABNORMAL LOW (ref 4.22–5.81)
RDW: 16.1 % — AB (ref 11.5–15.5)
WBC: 7.6 10*3/uL (ref 4.0–10.5)

## 2016-11-03 LAB — RENAL FUNCTION PANEL
Albumin: 2.7 g/dL — ABNORMAL LOW (ref 3.5–5.0)
Anion gap: 12 (ref 5–15)
BUN: 65 mg/dL — AB (ref 6–20)
CHLORIDE: 102 mmol/L (ref 101–111)
CO2: 25 mmol/L (ref 22–32)
CREATININE: 3.82 mg/dL — AB (ref 0.61–1.24)
Calcium: 9 mg/dL (ref 8.9–10.3)
GFR calc Af Amer: 16 mL/min — ABNORMAL LOW (ref >60)
GFR, EST NON AFRICAN AMERICAN: 14 mL/min — AB (ref >60)
GLUCOSE: 103 mg/dL — AB (ref 65–99)
POTASSIUM: 3.7 mmol/L (ref 3.5–5.1)
Phosphorus: 4.1 mg/dL (ref 2.5–4.6)
Sodium: 139 mmol/L (ref 135–145)

## 2016-11-03 LAB — LEGIONELLA PNEUMOPHILA SEROGP 1 UR AG: L. pneumophila Serogp 1 Ur Ag: NEGATIVE

## 2016-11-03 LAB — MAGNESIUM: MAGNESIUM: 2.5 mg/dL — AB (ref 1.7–2.4)

## 2016-11-03 MED ORDER — PIPERACILLIN-TAZOBACTAM IN DEX 2-0.25 GM/50ML IV SOLN
2.2500 g | Freq: Three times a day (TID) | INTRAVENOUS | Status: DC
  Administered 2016-11-03 – 2016-11-05 (×6): 2.25 g via INTRAVENOUS
  Filled 2016-11-03 (×8): qty 50

## 2016-11-03 MED ORDER — CHLORHEXIDINE GLUCONATE CLOTH 2 % EX PADS
6.0000 | MEDICATED_PAD | Freq: Every day | CUTANEOUS | Status: DC
  Administered 2016-11-04 – 2016-11-08 (×5): 6 via TOPICAL

## 2016-11-03 NOTE — Progress Notes (Signed)
Name: Omar Lambert MRN: [id number] DOB: [birth year]    ADMISSION DATE:  10/27/2016 CONSULTATION DATE: 10/27/16  REFERRING MD : ER ATTENDING DELO  CHIEF COMPLAINT:  BRADYCARDIA, UNRESPONSIVE  BRIEF PATIENT DESCRIPTION: 76 YR OLD MALE W/ PMHX CHF, CKD STAGE III/IV ( seen by Nephro  and discussing possible dialysis), Afib RVR, A flutter, DM presents after opthalmic surgery on 9/21 AM. Post surgery wife found him unresponsive at home at midnight. Bradycardic and unresponsive never lost pulse. Intubated, given atropine, started on Epinephrine, BP 72I systolic highest Hr in 20B. PCCM consulted   EKG - atrial fibrillation with slow ventricular response with RBBB low QRS voltage  ECHO  08/18/2015>> nml LVSF ,RVSP 40  Stress test nuclear lexiscan 10/23/2016  This is a low risk study.  Nuclear stress EF: 47%.   STUDIES:  CT HEAD 9/22>>> Negative    SIGNIFICANT EVENTS  ETT 9/23>> RIGHT IJ CVC 9/23>> A-LINE 9/23 >>9/28 9/24 -  paralyzed, sedated.  Remains on epi gtt r/t bradycardia, levophed 108mg.  9/24 CRRT>9/28  Off sedation since 9/25 - starting to wake slowly.     SUBJECTIVE/OVERNIGHT/INTERVAL HX 9/29 >off CRRT . Making urine , scr tr up .  -1.2 L I/O bal .  Remains on vent , not weaning .  Following simple commands.    VITAL SIGNS: Temp:  [99 F (37.2 C)-100.2 F (37.9 C)] 99.1 F (37.3 C) (09/29 0900) Pulse Rate:  [72-87] 75 (09/29 0900) Resp:  [18-26] 18 (09/29 0900) BP: (103-158)/(61-93) 107/61 (09/29 0800) SpO2:  [100 %] 100 % (09/29 0900) FiO2 (%):  [40 %] 40 % (09/29 0734) Weight:  [208 lb 8.9 oz (94.6 kg)] 208 lb 8.9 oz (94.6 kg) (09/29 0400)       General Appearance:    Critically ill on vent .   Head:    Normocephalic, without obvious abnormality, atraumatic  Eyes:   nml   Ears:    Nose: nml   Throat:  ETT TUBE - yes , OG tube - yes  Neck:   Supple,  No enlargement/tenderness/nodules     Lungs:     Clear to auscultation bilaterally, Ventilator    Synchrony - yes  Chest wall:    No deformity  Heart:    S1 and S2 normal, no murmur, CVP - no.   Pressors no   Abdomen:     Soft, no masses, no organomegaly  Genitalia:    Not done  Rectal:   not done  Extremities:   Extremities- intact     Skin:   Intact in exposed areas . Sacral area - no reporte of decube     Neurologic:    Sedated F/c , maex4     PULMONARY  Recent Labs Lab 10/27/16 1852 10/28/16 0431 10/28/16 0957 10/28/16 1042 10/28/16 1213 10/31/16 1116  PHART 7.482* 7.457* 7.243* 7.205* 7.483* 7.552*  PCO2ART 33.7 31.8* 56.7* 56.7* 29.4* 27.3*  PO2ART 80.0* 102.0 108.0 78.0* 224.0* 160*  HCO3 25.0 22.2 24.5 22.6 22.1 24.0  TCO2 _0 --   O2SAT 96.0 98.0 97.0 92.0 100.0 99.0    CBC  Recent Labs Lab 11/01/16 0341 11/02/16 0251 11/03/16 0512  HGB 11.7* 11.6* 11.5*  HCT 35.0* 35.6* 35.1*  WBC 5.4 5.9 7.6  PLT 152 144* 171    COAGULATION No results for input(s): INR in the last 168 hours.  CARDIAC    Recent Labs Lab 10/28/16 1141  TROPONINI 2.34*   No results  for input(s): PROBNP in the last 168 hours.   CHEMISTRY  Recent Labs Lab 10/31/16 1559 10/31/16 2146 11/01/16 0341 11/01/16 1622 11/01/16 1628 11/02/16 0251 11/03/16 0512  NA 137  --  138 138  --  139 139  K 3.4*  --  3.7 4.0  --  3.9 3.7  CL 102  --  103 104  --  103 102  CO2 25  --  22 25  --  27 25  GLUCOSE 109*  --  103* 102*  --  89 103*  BUN 38*  --  37* 35*  --  33* 65*  CREATININE 2.83*  --  2.66* 2.51*  --  2.42* 3.82*  CALCIUM 8.5*  --  8.5* 8.6*  --  8.7* 9.0  MG  --  2.4 2.4  --  2.3 2.3 2.5*  PHOS 2.6 2.6 3.1  3.1 2.6  --  3.2 4.1   Estimated Creatinine Clearance: 18.1 mL/min (A) (by C-G formula based on SCr of 3.82 mg/dL (H)).   LIVER  Recent Labs Lab 10/31/16 1559 11/01/16 0341 11/01/16 1622 11/02/16 0251 11/03/16 0512  ALBUMIN 2.4* 2.5* 2.5* 2.7* 2.7*     INFECTIOUS  Recent Labs Lab 10/27/16 1715 10/27/16 2318 10/29/16 0939  10/30/16 0412 10/31/16 0335  LATICACIDVEN 2.9* 2.3*  --   --   --   PROCALCITON  --   --  93.09 61.68 44.93     ENDOCRINE CBG (last 3)   Recent Labs  11/03/16 0002 11/03/16 0340 11/03/16 0755  GLUCAP 105* 84 96         IMAGING x48h  - image(s) personally visualized  -   highlighted in bold Dg Chest Port 1 View  Result Date: 11/02/2016 CLINICAL DATA:  Adjustment of endotracheal tube EXAM: PORTABLE CHEST 1 VIEW COMPARISON:  11/01/2016, 10/27/2016 FINDINGS: Endotracheal tube tip is about 3.2 cm superior to the carina. Esophageal tube tip extends below diaphragm. Cardiomegaly with continued small pleural effusions and left lower lobe consolidation. Vascular congestion and mild perihilar edema. Aortic atherosclerosis. No pneumothorax. IMPRESSION: 1. Endotracheal tube tip about 3.2 cm superior to the carina 2. Cardiomegaly with vascular congestion and continued small pleural effusions, mild pulmonary edema, and left lung base consolidation. Electronically Signed   By: Donavan Foil M.D.   On: 11/02/2016 18:21        ASSESSMENT / PLAN:  PULMONARY A: Acute resp failure with ? ALI physiology S/p nimbex x 48h  11/03/2016 - > does NOT  meet criteria for SBT/Extubation in setting of Acute Respiratory Failure due to acute encephalopathy   P:   Vent support - 8cc/kg  vap protocol Wean as able      CARDIOVASCULAR A:  H/o AFib on apixaban and amio admitted with bradycardia following timolol eye drop 10/27/2016 needing pressors  On 11/03/2016 - off all pressors and in sinus    P:  MAP goal > 85 Amio on hold Cards to comment on anticoagulation -Eliquis prior to admit -on hold     ENAL Acute on CKD IV   11/03/2016 - off CRRT 9/28 . Lasix on hold     P:   F/u chem  Monitor Renal following .  GASTROINTESTINAL A:   NPO  P:   TF PPI  HEMATOLOGIC A:   #RBC: mild anemia critical illness #Platelet mild thrombocytopenia - improving #WBC lekyocytosis  -resolved  P:  F/u CBC  SCD's   INFECTIOUS  A:   MSSA PCR + and PCT  c/w Septic shock - likely aspiration pneumonia -BC neg   9./28  - off pressors, urine strep neg   PLAN -  Continue zosyn  Trend pct  Urine legionella pending    ENDOCRINE A:   DM-2 Hypothyroid P:   ICU hyperglycemia protocol Resume synthroid - TSH 2.561  NEUROLOGIC A:   Acute encephalopathy noted post admission - improving slowly.  CT head 10/27/2016 non contributor   - improved follows commands 9/28 (off sedation since 10/27/16)   P:   RASS goal: 0 Supportive care  Avoid sedation     FAMILY  - Updates: wife updated at bedside 9/28 and every day prior to that by PCCM MD  - Inter-disciplinary family meet or Palliative Care meeting due by:  DAy 7     Tammy Parrett NP-C  [facility]  [phone]    11/03/2016 10:16 AM   Attending Note:  I have examined patient, reviewed labs, studies and notes. I have discussed the case with T Parrett, and I agree with the data and plans as amended above. 76 year old man, history chronic kidney disease, A. fib/flutter, CHF. He experienced a bradycardic near arrest with loss of consciousness resulting in VDRF and shock. Course complicated by bilateral infiltrates and presumed ARDS, possible aspiration pneumonitis/pneumonia. He had acute renal failure and required CVVHD, stopped on 9/28. His sedation is being decreased. On my evaluation he will wake easily to voice, tracks, follows some commands and nodded questions. He is still apneic when changed to pressure support. Lungs coarse bilaterally. Heart now regular, sinus rhythm. Abdomen benign. He is anticoagulation is currently on hold. We will follow his renal function off CVVHD. Complete course of Zosyn for presumed aspiration pneumonia. Continue lighten his sedation and assess for possible spontaneous breathing this afternoon or 9/30. Independent critical care time is 33 minutes.   Baltazar Apo, MD, PhD 11/03/2016, 1:17 PM [facility] [phone] or if no answer [phone]

## 2016-11-03 NOTE — Progress Notes (Signed)
Progress Note  Patient Name: Omar Lambert Date of Encounter: 11/03/2016  Primary Cardiologist: Dr. Claiborne Lambert   Subjective   Intubated, occasionally responding to commands.  Inpatient Medications    Scheduled Meds: . chlorhexidine gluconate (MEDLINE KIT)  15 mL Mouth Rinse BID  . Chlorhexidine Gluconate Cloth  6 each Topical Daily  . feeding supplement (PRO-STAT SUGAR FREE 64)  60 mL Per Tube BID  . feeding supplement (VITAL HIGH PROTEIN)  1,000 mL Per Tube Q24H  . heparin  5,000 Units Subcutaneous Q8H  . insulin aspart  0-15 Units Subcutaneous Q4H  . levothyroxine  175 mcg Oral QAC breakfast  . mouth rinse  15 mL Mouth Rinse 10 times per day  . mupirocin ointment  1 application Nasal BID  . pantoprazole sodium  40 mg Per Tube Q1200  . sodium chloride flush  10-40 mL Intracatheter Q12H   Continuous Infusions: . sodium chloride Stopped (10/27/16 1515)  . sodium chloride    . sodium chloride 10 mL/hr at 11/03/16 1200  . piperacillin-tazobactam (ZOSYN)  IV     PRN Meds: sodium chloride, sodium chloride, fentaNYL (SUBLIMAZE) injection, heparin, sodium chloride flush   Vital Signs    Vitals:   11/03/16 1000 11/03/16 1100 11/03/16 1149 11/03/16 1200  BP: 110/64 111/68 111/68   Pulse: 71 73 74 74  Resp: (!) 25 (!) 25 (!) 25 (!) 25  Temp: 99.1 F (37.3 C) 99.1 F (37.3 C) 99 F (37.2 C) 99 F (37.2 C)  TempSrc:      SpO2: 100% 100% 100% 100%  Weight:      Height:        Intake/Output Summary (Last 24 hours) at 11/03/16 1310 Last data filed at 11/03/16 1200  Gross per 24 hour  Intake             1360 ml  Output             1032 ml  Net              328 ml   Filed Weights   11/01/16 0500 11/02/16 0231 11/03/16 0400  Weight: 218 lb 0.6 oz (98.9 kg) 209 lb 3.5 oz (94.9 kg) 208 lb 8.9 oz (94.6 kg)    Telemetry    Sinus rhythm- Personally Reviewed  ECG    None new - Personally Reviewed  Physical Exam   VS:  BP 111/68   Pulse 74   Temp 99 F (37.2 C)    Resp (!) 25   Ht _0  (1.727 m)   Wt 208 lb 8.9 oz (94.6 kg)   SpO2 100%   BMI 31.71 kg/m  , BMI Body mass index is 31.71 kg/m. GEN: ill appearing, no acute distress, ET tube in place HEENT: normal  Neck: unable to assess due to central lines Cardiac: RRR; no murmurs, rubs, or gallops,no edema  Respiratory:  Mechanical sounds of ventilator, clear to auscultation anterior, normal work of breathing GI: soft, nontender, nondistended, + BS MS: no deformity or atrophy  Skin: warm and dry Neuro:  Strength and sensation are intact Psych: unable to assess  Labs    Chemistry  Recent Labs Lab 11/01/16 1622 11/02/16 0251 11/03/16 0512  NA 138 139 139  K 4.0 3.9 3.7  CL 104 103 102  CO2 _1 GLUCOSE 102* 89 103*  BUN 35* 33* 65*  CREATININE 2.51* 2.42* 3.82*  CALCIUM 8.6* 8.7* 9.0  ALBUMIN 2.5* 2.7* 2.7*  GFRNONAA  23* 24* 14*  GFRAA 27* 28* 16*  ANIONGAP _0 Hematology  Recent Labs Lab 11/01/16 0341 11/02/16 0251 11/03/16 0512  WBC 5.4 5.9 7.6  RBC 3.79* 3.77* 3.74*  HGB 11.7* 11.6* 11.5*  HCT 35.0* 35.6* 35.1*  MCV 92.3 94.4 93.9  MCH 30.9 30.8 30.7  MCHC 33.4 32.6 32.8  RDW 15.9* 16.0* 16.1*  PLT 152 144* 171    Cardiac Enzymes  Recent Labs Lab 10/28/16 1141  TROPONINI 2.34*    No results for input(s): TROPIPOC in the last 168 hours.   BNPNo results for input(s): BNP, PROBNP in the last 168 hours.   DDimer No results for input(s): DDIMER in the last 168 hours.   Radiology    Dg Chest Port 1 View  Result Date: 11/02/2016 CLINICAL DATA:  Adjustment of endotracheal tube EXAM: PORTABLE CHEST 1 VIEW COMPARISON:  11/01/2016, 10/27/2016 FINDINGS: Endotracheal tube tip is about 3.2 cm superior to the carina. Esophageal tube tip extends below diaphragm. Cardiomegaly with continued small pleural effusions and left lower lobe consolidation. Vascular congestion and mild perihilar edema. Aortic atherosclerosis. No pneumothorax. IMPRESSION: 1.  Endotracheal tube tip about 3.2 cm superior to the carina 2. Cardiomegaly with vascular congestion and continued small pleural effusions, mild pulmonary edema, and left lung base consolidation. Electronically Signed   By: Donavan Foil M.D.   On: 11/02/2016 18:21    Cardiac Studies   Echo 10/27/16: Study Conclusions  - Left ventricle: The cavity size was normal. Wall thickness was   increased in a pattern of moderate LVH. Systolic function was   vigorous. The estimated ejection fraction was in the range of 65%   to 70%. Wall motion was normal; there were no regional wall   motion abnormalities. - Ventricular septum: The contour showed diastolic flattening. - Right ventricle: The cavity size was moderately dilated. - Right atrium: The atrium was moderately dilated. - Tricuspid valve: There was severe regurgitation. - Pulmonary arteries: Systolic pressure was mildly to moderately   increased. PA peak pressure: 38 mm Hg (S).  Lexiscan Myoview 10/23/2016  There was no ST segment deviation noted during stress.  The study is normal.  This is a low risk study.  The left ventricular ejection fraction is mildly decreased (45-54%). Nuclear stress EF: 47%.   Patient Profile     76 y.o. male with paroxysmal atrial flutter, hypertension, OSA on CPAP, DM, CKD IV, hypothyroidism, and severe COPD here with symptomatic bradycardia and cardiogenic shock.  He became bradycardic and unresponsive but reportedly never lost a pulse.  He was intubated and given atropine and epinephrine.   Assessment & Plan    # Atrial fibrillation/flutter: # Symptomatic bradycardia: Currently in sinus rhythm. Would continue amiodarone and eliquis. Omar Lambert likely need monitoring as an outpatient if no rhythm abnormalities are seen.  # Acute on chronic diastolic heart failure: # Cardiogenic shock: Euvolemic on exam.   # Elevated troponin:  Troponin up to 2.34. Likely due to arrest. No further intervention  # Acute  on chronic renal failure IV:  Off CRRT. Per nephrology   For questions or updates, please contact Omar Lambert Please consult [email or web address] for contact info under Cardiology/STEMI.      Signed, Pinkey Lambert Meredith Leeds, MD  11/03/2016, 1:10 PM

## 2016-11-03 NOTE — Progress Notes (Signed)
Subjective:  Remains in ICU off pressors, still on vent- UOP at 646  last 24 hours off lasix- but 225 so far this AM. CRRT stopped  Objective Vital signs in last 24 hours: Vitals:   11/03/16 0400 11/03/16 0500 11/03/16 0600 11/03/16 0700  BP: 114/71 137/80 (!) 157/81 (!) 148/82  Pulse: 74 77 83 79  Resp: (!) 25 (!) 25 (!) 25 (!) 26  Temp: 99.9 F (37.7 C) 99.3 F (37.4 C) 99.3 F (37.4 C) 99.1 F (37.3 C)  TempSrc:      SpO2: 100% 100% 100% 100%  Weight: 94.6 kg (208 lb 8.9 oz)     Height:       Weight change: -0.3 kg (-10.6 oz)  Intake/Output Summary (Last 24 hours) at 11/03/16 0810 Last data filed at 11/03/16 0600  Gross per 24 hour  Intake             1485 ml  Output              905 ml  Net              580 ml    Assessment/ Plan: Pt is a 76 y.o. yo male who was admitted on 10/27/2016 with being poorly responsive after eye surgery- has advanced CKD at baseline- now with A on CRF  Assessment/Plan: 1. Renal- non oliguric A on CRF-  plans were starting to be made for dialysis as OP- pt uremic and in the setting of being critically ill even though no absolute indications I felt it best to initiate dialysis in order for him to overall imporve.  Daughter agreed had  CRRT 9/25-9/28. Labs and volume status improved, CRRT stopped 9/28- labs worse today not surprisingly but no need for any RRT right now, cont to follow, leave vascath in  -  K is good- phos is OK 2. HTN/vol- was overloaded by exam- now much better- responded to lasix and CRRT- response too brisk initially , lasix now stopped - no change for now 3. Anemia- hgb over 11- no action needed yet     Malvern Kadlec A    Labs: Basic Metabolic Panel:  Recent Labs Lab 11/01/16 1622 11/02/16 0251 11/03/16 0512  NA 138 139 139  K 4.0 3.9 3.7  CL 104 103 102  CO2 _0 GLUCOSE 102* 89 103*  BUN 35* 33* 65*  CREATININE 2.51* 2.42* 3.82*  CALCIUM 8.6* 8.7* 9.0  PHOS 2.6 3.2 4.1   Liver Function  Tests:  Recent Labs Lab 11/01/16 1622 11/02/16 0251 11/03/16 0512  ALBUMIN 2.5* 2.7* 2.7*   No results for input(s): LIPASE, AMYLASE in the last 168 hours. No results for input(s): AMMONIA in the last 168 hours. CBC:  Recent Labs Lab 10/30/16 0412 10/31/16 0335 11/01/16 0341 11/02/16 0251 11/03/16 0512  WBC 8.0 6.0 5.4 5.9 7.6  NEUTROABS  --  4.4 3.9 4.5 5.6  HGB 11.8* 12.2* 11.7* 11.6* 11.5*  HCT 34.5* 36.6* 35.0* 35.6* 35.1*  MCV 90.3 92.0 92.3 94.4 93.9  PLT 130* 132* 152 144* 171   Cardiac Enzymes:  Recent Labs Lab 10/28/16 1141  TROPONINI 2.34*   CBG:  Recent Labs Lab 11/02/16 1603 11/02/16 2012 11/03/16 0002 11/03/16 0340 11/03/16 0755  GLUCAP 107* 90 105* 84 96    Iron Studies: No results for input(s): IRON, TIBC, TRANSFERRIN, FERRITIN in the last 72 hours. Studies/Results: Dg Chest Port 1 View  Result Date: 11/02/2016 CLINICAL DATA:  Adjustment of endotracheal tube  EXAM: PORTABLE CHEST 1 VIEW COMPARISON:  11/01/2016, 10/27/2016 FINDINGS: Endotracheal tube tip is about 3.2 cm superior to the carina. Esophageal tube tip extends below diaphragm. Cardiomegaly with continued small pleural effusions and left lower lobe consolidation. Vascular congestion and mild perihilar edema. Aortic atherosclerosis. No pneumothorax. IMPRESSION: 1. Endotracheal tube tip about 3.2 cm superior to the carina 2. Cardiomegaly with vascular congestion and continued small pleural effusions, mild pulmonary edema, and left lung base consolidation. Electronically Signed   By: Donavan Foil M.D.   On: 11/02/2016 18:21   Medications: Infusions: . sodium chloride Stopped (10/27/16 1515)  . sodium chloride    . sodium chloride 10 mL/hr at 11/02/16 2000  . piperacillin-tazobactam (ZOSYN)  IV Stopped (11/03/16 3612)    Scheduled Medications: . chlorhexidine gluconate (MEDLINE KIT)  15 mL Mouth Rinse BID  . Chlorhexidine Gluconate Cloth  6 each Topical Daily  . feeding supplement  (PRO-STAT SUGAR FREE 64)  60 mL Per Tube BID  . feeding supplement (VITAL HIGH PROTEIN)  1,000 mL Per Tube Q24H  . heparin  5,000 Units Subcutaneous Q8H  . insulin aspart  0-15 Units Subcutaneous Q4H  . levothyroxine  175 mcg Oral QAC breakfast  . mouth rinse  15 mL Mouth Rinse 10 times per day  . mupirocin ointment  1 application Nasal BID  . pantoprazole sodium  40 mg Per Tube Q1200  . sodium chloride flush  10-40 mL Intracatheter Q12H    have reviewed scheduled and prn medications.  Physical Exam: General:  more alert still today on vent- following commands Heart: RRR Lungs: on vent- CBS Abdomen: obese, soft, non tender Extremities: pitting edema but better  Dialysis Access: vascath placed 9/24    11/03/2016,8:10 AM  LOS: 7 days

## 2016-11-04 ENCOUNTER — Encounter (HOSPITAL_COMMUNITY): Payer: Self-pay | Admitting: *Deleted

## 2016-11-04 LAB — GLUCOSE, CAPILLARY
GLUCOSE-CAPILLARY: 99 mg/dL (ref 65–99)
GLUCOSE-CAPILLARY: 91 mg/dL (ref 65–99)
Glucose-Capillary: 94 mg/dL (ref 65–99)
Glucose-Capillary: 98 mg/dL (ref 65–99)
Glucose-Capillary: 101 mg/dL — ABNORMAL HIGH (ref 65–99)
Glucose-Capillary: 107 mg/dL — ABNORMAL HIGH (ref 65–99)

## 2016-11-04 LAB — RENAL FUNCTION PANEL
ANION GAP: 11 (ref 5–15)
Albumin: 2.5 g/dL — ABNORMAL LOW (ref 3.5–5.0)
BUN: 87 mg/dL — ABNORMAL HIGH (ref 6–20)
CHLORIDE: 107 mmol/L (ref 101–111)
CO2: 24 mmol/L (ref 22–32)
CREATININE: 4.75 mg/dL — AB (ref 0.61–1.24)
Calcium: 9 mg/dL (ref 8.9–10.3)
GFR, EST AFRICAN AMERICAN: 12 mL/min — AB (ref >60)
GFR, EST NON AFRICAN AMERICAN: 11 mL/min — AB (ref >60)
Glucose, Bld: 92 mg/dL (ref 65–99)
Phosphorus: 5.5 mg/dL — ABNORMAL HIGH (ref 2.5–4.6)
Potassium: 3.6 mmol/L (ref 3.5–5.1)
SODIUM: 142 mmol/L (ref 135–145)

## 2016-11-04 LAB — CBC
HCT: 33.5 % — ABNORMAL LOW (ref 39.0–52.0)
Hemoglobin: 10.9 g/dL — ABNORMAL LOW (ref 13.0–17.0)
MCH: 30.9 pg (ref 26.0–34.0)
MCHC: 32.5 g/dL (ref 30.0–36.0)
MCV: 94.9 fL (ref 78.0–100.0)
PLATELETS: 210 10*3/uL (ref 150–400)
RBC: 3.53 MIL/uL — AB (ref 4.22–5.81)
RDW: 16.4 % — ABNORMAL HIGH (ref 11.5–15.5)
WBC: 8.7 10*3/uL (ref 4.0–10.5)

## 2016-11-04 LAB — MAGNESIUM: MAGNESIUM: 2.6 mg/dL — AB (ref 1.7–2.4)

## 2016-11-04 NOTE — Progress Notes (Signed)
Subjective:  Remains in ICU off pressors, still on vent- UOP 1575 yest  !! Ut kidney numbers worse  Objective Vital signs in last 24 hours: Vitals:   11/04/16 0416 11/04/16 0500 11/04/16 0600 11/04/16 0700  BP:  128/73 119/66 121/73  Pulse:  75  77  Resp:  (!) 22 (!) 25 (!) 25  Temp:  99.7 F (37.6 C) 99.7 F (37.6 C) 99.7 F (37.6 C)  TempSrc:  Other (Comment) Other (Comment) Other (Comment)  SpO2:  98% 100% 100%  Weight: 95.6 kg (210 lb 12.2 oz)     Height:       Weight change: 1 kg (2 lb 3.3 oz)  Intake/Output Summary (Last 24 hours) at 11/04/16 0829 Last data filed at 11/04/16 0600  Gross per 24 hour  Intake             1260 ml  Output             1700 ml  Net             -440 ml    Assessment/ Plan: Pt is a 76 y.o. yo male who was admitted on 10/27/2016 with being poorly responsive after eye surgery- has advanced CKD at baseline- now with A on CRF  Assessment/Plan: 1. Renal- non oliguric A on CRF-  plans were starting to be made for dialysis as OP- pt uremic and in the setting of being critically ill even though no absolute indications I felt it best to initiate dialysis in order for him to overall imporve.  Daughter agreed --  CRRT 9/25-9/28. Labs and volume status improved, CRRT stopped 9/28- labs worse again today not surprisingly but no need for any RRT right now, cont to follow, leave vascath in  -  K is good- phos is OK.  I have discussed with family that if numbers worse tomorrow will likely need HD in the intermittent form and this could mean that dialysis would need to be continued 2. HTN/vol- was overloaded by exam- now much better- responded to lasix and CRRT- response too brisk initially , lasix now stopped - no change for now 3. Anemia- hgb close to  11- no action needed yet     [name] A    Labs: Basic Metabolic Panel:  Recent Labs Lab 11/02/16 0251 11/03/16 0512 11/04/16 0415  NA 139 139 142  K 3.9 3.7 3.6  CL 103 102 107  CO2 '27 25 24   ' GLUCOSE 89 103* 92  BUN 33* 65* 87*  CREATININE 2.42* 3.82* 4.75*  CALCIUM 8.7* 9.0 9.0  PHOS 3.2 4.1 5.5*   Liver Function Tests:  Recent Labs Lab 11/02/16 0251 11/03/16 0512 11/04/16 0415  ALBUMIN 2.7* 2.7* 2.5*   No results for input(s): LIPASE, AMYLASE in the last 168 hours. No results for input(s): AMMONIA in the last 168 hours. CBC:  Recent Labs Lab 10/31/16 0335 11/01/16 0341 11/02/16 0251 11/03/16 0512 11/04/16 0415  WBC 6.0 5.4 5.9 7.6 8.7  NEUTROABS 4.4 3.9 4.5 5.6  --   HGB 12.2* 11.7* 11.6* 11.5* 10.9*  HCT 36.6* 35.0* 35.6* 35.1* 33.5*  MCV 92.0 92.3 94.4 93.9 94.9  PLT 132* 152 144* 171 210   Cardiac Enzymes:  Recent Labs Lab 10/28/16 1141  TROPONINI 2.34*   CBG:  Recent Labs Lab 11/03/16 1609 11/03/16 1926 11/03/16 2326 11/04/16 0347 11/04/16 0731  GLUCAP 95 101* 105* 94 98    Iron Studies: No results for input(s): IRON, TIBC, TRANSFERRIN, FERRITIN  in the last 72 hours. Studies/Results: Dg Chest Port 1 View  Result Date: 11/02/2016 CLINICAL DATA:  Adjustment of endotracheal tube EXAM: PORTABLE CHEST 1 VIEW COMPARISON:  11/01/2016, 10/27/2016 FINDINGS: Endotracheal tube tip is about 3.2 cm superior to the carina. Esophageal tube tip extends below diaphragm. Cardiomegaly with continued small pleural effusions and left lower lobe consolidation. Vascular congestion and mild perihilar edema. Aortic atherosclerosis. No pneumothorax. IMPRESSION: 1. Endotracheal tube tip about 3.2 cm superior to the carina 2. Cardiomegaly with vascular congestion and continued small pleural effusions, mild pulmonary edema, and left lung base consolidation. Electronically Signed   By: Donavan Foil M.D.   On: 11/02/2016 18:21   Medications: Infusions: . sodium chloride Stopped (10/27/16 1515)  . sodium chloride    . sodium chloride 10 mL/hr at 11/03/16 2000  . piperacillin-tazobactam (ZOSYN)  IV Stopped (11/04/16 8003)    Scheduled Medications: .  chlorhexidine gluconate (MEDLINE KIT)  15 mL Mouth Rinse BID  . Chlorhexidine Gluconate Cloth  6 each Topical Daily  . feeding supplement (PRO-STAT SUGAR FREE 64)  60 mL Per Tube BID  . feeding supplement (VITAL HIGH PROTEIN)  1,000 mL Per Tube Q24H  . heparin  5,000 Units Subcutaneous Q8H  . insulin aspart  0-15 Units Subcutaneous Q4H  . levothyroxine  175 mcg Oral QAC breakfast  . mouth rinse  15 mL Mouth Rinse 10 times per day  . pantoprazole sodium  40 mg Per Tube Q1200  . sodium chloride flush  10-40 mL Intracatheter Q12H    have reviewed scheduled and prn medications.  Physical Exam: General:  more alert still today on vent- following commands Heart: RRR Lungs: on vent- CBS Abdomen: obese, soft, non tender Extremities: minimal edema  Dialysis Access: vascath placed 9/24    11/04/2016,8:29 AM  LOS: 8 days

## 2016-11-04 NOTE — Progress Notes (Signed)
pt temperature 100.4. No orders for PRN. MD Warren Lacy) states ok just to keep monitoring pt. No new orders given

## 2016-11-04 NOTE — Progress Notes (Signed)
Name: Omar Lambert MRN: [id number] DOB: 1940/05/10    ADMISSION DATE:  10/27/2016 CONSULTATION DATE: 10/27/16  REFERRING MD : ER ATTENDING DELO  CHIEF COMPLAINT:  BRADYCARDIA, UNRESPONSIVE  BRIEF PATIENT DESCRIPTION: 76 YR OLD MALE W/ PMHX CHF, CKD STAGE III/IV ( seen by Nephro  and discussing possible dialysis), Afib RVR, A flutter, DM presents after opthalmic surgery on 9/21 AM. Post surgery wife found him unresponsive at home at midnight. Bradycardic and unresponsive never lost pulse. Intubated, given atropine, started on Epinephrine, BP 50P systolic highest Hr in 54S. PCCM consulted   EKG - atrial fibrillation with slow ventricular response with RBBB low QRS voltage  ECHO  08/18/2015>> nml LVSF ,RVSP 40  Stress test nuclear lexiscan 10/23/2016  This is a low risk study.  Nuclear stress EF: 47%.   STUDIES:  CT HEAD 9/22>>> Negative    SIGNIFICANT EVENTS  ETT 9/23>> RIGHT IJ CVC 9/23>> A-LINE 9/23 >>9/28 9/24 -  paralyzed, sedated.  Remains on epi gtt r/t bradycardia, levophed 30mg.  9/24 CRRT>9/28  Off sedation since 9/25 - starting to wake slowly.     SUBJECTIVE/OVERNIGHT/INTERVAL HX Remains off CRRT . Making urine , scr tr up .  Neg 1.6 L I/O Bal .  Failed SBT this am .  F/c , moving arms/leg.  Wife at bedside with update.    VITAL SIGNS: Temp:  [98.8 F (37.1 C)-99.9 F (37.7 C)] 99.5 F (37.5 C) (09/30 1000) Pulse Rate:  [72-78] 74 (09/30 1000) Resp:  [20-25] 22 (09/30 1000) BP: (111-143)/(64-79) 120/66 (09/30 1000) SpO2:  [98 %-100 %] 100 % (09/30 1000) FiO2 (%):  [40 %] 40 % (09/30 0745) Weight:  [210 lb 12.2 oz (95.6 kg)] 210 lb 12.2 oz (95.6 kg) (09/30 0416)       General Appearance:    Critically ill on vent .   Head:    Normocephalic, without obvious abnormality, atraumatic  Eyes:   right upper lid droop-  Ears:    Nose: nml   Throat:  ETT TUBE - yes , OG tube - yes  Neck:   Supple,  No enlargement/tenderness/nodules     Lungs:      Clear to auscultation bilaterally, Ventilator   Synchrony - yes  Chest wall:    No deformity  Heart:    S1 and S2 normal, no murmur, CVP - no.   Pressors no   Abdomen:     Soft, no masses, no organomegaly  Genitalia:    Not done  Rectal:   not done  Extremities:   Extremities- intact     Skin:   Intact in exposed areas . Sacral area - no reporte of decube     Neurologic:   F/c , maex4     PULMONARY  Recent Labs Lab 10/28/16 1213 10/31/16 1116  PHART 7.483* 7.552*  PCO2ART 29.4* 27.3*  PO2ART 224.0* 160*  HCO3 22.1 24.0  TCO2 23  --   O2SAT 100.0 99.0    CBC  Recent Labs Lab 11/02/16 0251 11/03/16 0512 11/04/16 0415  HGB 11.6* 11.5* 10.9*  HCT 35.6* 35.1* 33.5*  WBC 5.9 7.6 8.7  PLT 144* 171 210    COAGULATION No results for input(s): INR in the last 168 hours.  CARDIAC    Recent Labs Lab 10/28/16 1141  TROPONINI 2.34*   No results for input(s): PROBNP in the last 168 hours.   CHEMISTRY  Recent Labs Lab 11/01/16 0568109/27/18 1622 11/01/16 1628 11/02/16 0251 11/03/16 02751  11/04/16 0415  NA 138 138  --  139 139 142  K 3.7 4.0  --  3.9 3.7 3.6  CL 103 104  --  103 102 107  CO2 22 25  --  _0 GLUCOSE 103* 102*  --  89 103* 92  BUN 37* 35*  --  33* 65* 87*  CREATININE 2.66* 2.51*  --  2.42* 3.82* 4.75*  CALCIUM 8.5* 8.6*  --  8.7* 9.0 9.0  MG 2.4  --  2.3 2.3 2.5* 2.6*  PHOS 3.1  3.1 2.6  --  3.2 4.1 5.5*   Estimated Creatinine Clearance: 14.6 mL/min (A) (by C-G formula based on SCr of 4.75 mg/dL (H)).   LIVER  Recent Labs Lab 11/01/16 0341 11/01/16 1622 11/02/16 0251 11/03/16 0512 11/04/16 0415  ALBUMIN 2.5* 2.5* 2.7* 2.7* 2.5*     INFECTIOUS  Recent Labs Lab 10/29/16 0939 10/30/16 0412 10/31/16 0335  PROCALCITON 93.09 61.68 44.93     ENDOCRINE CBG (last 3)   Recent Labs  11/03/16 2326 11/04/16 0347 11/04/16 0731  GLUCAP 105* 94 98         IMAGING x48h  - image(s) personally visualized  -    highlighted in bold Dg Chest Port 1 View  Result Date: 11/02/2016 CLINICAL DATA:  Adjustment of endotracheal tube EXAM: PORTABLE CHEST 1 VIEW COMPARISON:  11/01/2016, 10/27/2016 FINDINGS: Endotracheal tube tip is about 3.2 cm superior to the carina. Esophageal tube tip extends below diaphragm. Cardiomegaly with continued small pleural effusions and left lower lobe consolidation. Vascular congestion and mild perihilar edema. Aortic atherosclerosis. No pneumothorax. IMPRESSION: 1. Endotracheal tube tip about 3.2 cm superior to the carina 2. Cardiomegaly with vascular congestion and continued small pleural effusions, mild pulmonary edema, and left lung base consolidation. Electronically Signed   By: Donavan Foil M.D.   On: 11/02/2016 18:21        ASSESSMENT / PLAN:  PULMONARY A: Acute resp failure with ? ALI physiology S/p nimbex x 48h Hx of OSA , when extubated will need CPAP At bedtime  .     11/04/2016 - > does NOT  meet criteria for SBT/Extubation in setting of Acute Respiratory Failure . Mentation is improving , less of a barrier for extubation    P:   Vent support - 8cc/kg  vap protocol Wean as able  Check cxr in am .      CARDIOVASCULAR A:  H/o AFib on apixaban and amio prior to admit .  admitted with bradycardia following timolol eye drop 10/27/2016 needing pressors Diastolic CHF  Cardiogenic Shock -improved off pressure support x >48 hr  On 11/04/2016 - off all pressors and in sinus    P:  MAP goal > 85 Amio on hold Prev on Elquis , consider restart .  Cards following .     ENAL Acute on CKD IV   11/04/2016 - off CRRT 9/28 . Lasix on hold     P:   F/u chem  Monitor Renal following, considering HD on 10/1 .   GASTROINTESTINAL A:   NPO  P:   TF PPI  HEMATOLOGIC A:   #RBC: mild anemia critical illness #Platelet mild thrombocytopenia - improving #WBC lekyocytosis -resolved  P:  F/u CBC  SCD's /Hep for DVT prophylaxis   INFECTIOUS  A:   MSSA  PCR + and PCT c/w Septic shock - likely aspiration pneumonia -BC neg   9./28  - off pressors, urine strep neg   PLAN -  Continue zosyn   Urine legionella pending    ENDOCRINE A:   DM-2 Hypothyroid P:   ICU hyperglycemia protocol Resume synthroid - TSH 2.561  NEUROLOGIC A:   Acute encephalopathy noted post admission - improving slowly.  CT head 10/27/2016 non contributor   - improved follows commands 9/28 (off sedation since 10/27/16)   P:   RASS goal: 0 Supportive care  Avoid sedation     FAMILY  - Updates: wife updated at bedside 9/30   - Inter-disciplinary family meet or Palliative Care meeting due by:  DAy 7     Tammy Parrett NP-C  [facility]  [phone]    11/04/2016 10:46 AM  Attending Note:  I have examined patient, reviewed labs, studies and notes. I have discussed the case with T Parrett, and I agree with the data and plans as amended above. 76 year old man, history of chronic kidney disease, atrial fibrillation/flutter, CHF. He was admitted with shock and loss of consciousness in the setting of a bradycardic near arrest, possibly due to timolol eyedrops. Course, complicated by bilateral infiltrates. He was intubated and ventilated. Treated for possible aspiration pneumonia. He developed renal failure and required CVVHD. On evaluation today he is awake, will not questions, follow commands. He has coarse breath sounds. He had been apneic earlier on pressure support but will currently tolerate. Heart is regular without a murmur. Abdomen is somewhat distended, soft with positive bowel sounds. He has trace bilateral lower strongly edema. I will continue him on pressure support as he will tolerate, hopefully move towards extubation in the near future. CVVH stopped on 928, he will probably need intermittent hemodialysis on 10/1. Complete course of Zosyn. At some point we will need to talk about the timing of restarting Eliquis and amiodarone.   Independent critical care time is 32 minutes.   Baltazar Apo, MD, PhD 11/04/2016, 2:26 PM [facility] [phone] or if no answer [phone]

## 2016-11-05 ENCOUNTER — Inpatient Hospital Stay (HOSPITAL_COMMUNITY): Payer: Medicare HMO

## 2016-11-05 DIAGNOSIS — N189 Chronic kidney disease, unspecified: Secondary | ICD-10-CM

## 2016-11-05 DIAGNOSIS — J9600 Acute respiratory failure, unspecified whether with hypoxia or hypercapnia: Secondary | ICD-10-CM

## 2016-11-05 DIAGNOSIS — N179 Acute kidney failure, unspecified: Secondary | ICD-10-CM

## 2016-11-05 HISTORY — DX: Acute respiratory failure, unspecified whether with hypoxia or hypercapnia: J96.00

## 2016-11-05 HISTORY — DX: Chronic kidney disease, unspecified: N17.9

## 2016-11-05 HISTORY — DX: Chronic kidney disease, unspecified: N18.9

## 2016-11-05 LAB — PROTIME-INR
INR: 1.15
Prothrombin Time: 14.6 seconds (ref 11.4–15.2)

## 2016-11-05 LAB — RENAL FUNCTION PANEL
ANION GAP: 13 (ref 5–15)
Albumin: 2.6 g/dL — ABNORMAL LOW (ref 3.5–5.0)
BUN: 104 mg/dL — ABNORMAL HIGH (ref 6–20)
CHLORIDE: 109 mmol/L (ref 101–111)
CO2: 25 mmol/L (ref 22–32)
Calcium: 8.9 mg/dL (ref 8.9–10.3)
Creatinine, Ser: 5.36 mg/dL — ABNORMAL HIGH (ref 0.61–1.24)
GFR calc non Af Amer: 9 mL/min — ABNORMAL LOW (ref >60)
GFR, EST AFRICAN AMERICAN: 11 mL/min — AB (ref >60)
Glucose, Bld: 101 mg/dL — ABNORMAL HIGH (ref 65–99)
Phosphorus: 6.9 mg/dL — ABNORMAL HIGH (ref 2.5–4.6)
Potassium: 3.7 mmol/L (ref 3.5–5.1)
Sodium: 147 mmol/L — ABNORMAL HIGH (ref 135–145)

## 2016-11-05 LAB — IRON AND TIBC
Iron: 33 ug/dL — ABNORMAL LOW (ref 45–182)
Saturation Ratios: 11 % — ABNORMAL LOW (ref 17.9–39.5)
TIBC: 305 ug/dL (ref 250–450)
UIBC: 272 ug/dL

## 2016-11-05 LAB — GLUCOSE, CAPILLARY
GLUCOSE-CAPILLARY: 91 mg/dL (ref 65–99)
GLUCOSE-CAPILLARY: 116 mg/dL — AB (ref 65–99)
GLUCOSE-CAPILLARY: 98 mg/dL (ref 65–99)
GLUCOSE-CAPILLARY: 93 mg/dL (ref 65–99)
Glucose-Capillary: 94 mg/dL (ref 65–99)
Glucose-Capillary: 87 mg/dL (ref 65–99)

## 2016-11-05 LAB — CBC
HCT: 32.9 % — ABNORMAL LOW (ref 39.0–52.0)
HEMOGLOBIN: 10.6 g/dL — AB (ref 13.0–17.0)
MCH: 30.9 pg (ref 26.0–34.0)
MCHC: 32.2 g/dL (ref 30.0–36.0)
MCV: 95.9 fL (ref 78.0–100.0)
Platelets: 246 10*3/uL (ref 150–400)
RBC: 3.43 MIL/uL — AB (ref 4.22–5.81)
RDW: 16.4 % — ABNORMAL HIGH (ref 11.5–15.5)
WBC: 10.1 10*3/uL (ref 4.0–10.5)

## 2016-11-05 LAB — MAGNESIUM: Magnesium: 2.7 mg/dL — ABNORMAL HIGH (ref 1.7–2.4)

## 2016-11-05 MED ORDER — PATIENT'S GUIDE TO USING COUMADIN BOOK
Freq: Once | Status: DC
  Filled 2016-11-05: qty 1

## 2016-11-05 MED ORDER — WARFARIN - PHARMACIST DOSING INPATIENT
Freq: Every day | Status: DC
  Administered 2016-11-05: 18:00:00

## 2016-11-05 MED ORDER — WARFARIN SODIUM 2.5 MG PO TABS
2.5000 mg | ORAL_TABLET | Freq: Once | ORAL | Status: AC
  Administered 2016-11-05: 2.5 mg via ORAL
  Filled 2016-11-05 (×2): qty 1

## 2016-11-05 NOTE — Progress Notes (Signed)
No further bradycardia noted - remains in sinus in the 80's. On amiodarone and Eliquis. Agree with Dr. Blenda Mounts recommendations of outpatient 30 day monitor after discharge. Troponin elevation likely secondary to demand ischemia. LVEF normal on echo. Not an ideal cath candidate given advanced kidney disease. Creatinine continues to climb - now 5.36 today, however has been making urine.  No further cardiac suggestions at this time. Call with questions, but will sign-off for now.  Pixie Casino, MD, [place]  Attending Cardiologist  Direct Dial: [phone]  Fax: [phone]  Website:  [email or web address]

## 2016-11-05 NOTE — Plan of Care (Signed)
Problem: Respiratory: Goal: Ability to maintain a clear airway and adequate ventilation will improve Outcome: Progressing Pt weaning on vent.

## 2016-11-05 NOTE — Progress Notes (Signed)
Name: Omar Lambert MRN: [id number] DOB: November 24, 1940    ADMISSION DATE:  10/27/2016 CONSULTATION DATE: 10/27/16  REFERRING MD : ER ATTENDING DELO  CHIEF COMPLAINT:  BRADYCARDIA, UNRESPONSIVE  BRIEF PATIENT DESCRIPTION: 76 YR OLD MALE W/ PMHX CHF, CKD STAGE III/IV ( seen by Nephro  and discussing possible dialysis), Afib RVR, A flutter, DM presents after opthalmic surgery on 9/21 AM. Post surgery wife found him unresponsive at home at midnight. Bradycardic and unresponsive never lost pulse. Intubated, given atropine, started on Epinephrine, BP 24O systolic highest Hr in 97D. PCCM consulted   EKG - atrial fibrillation with slow ventricular response with RBBB low QRS voltage  ECHO  08/18/2015>> nml LVSF ,RVSP 40  Stress test nuclear lexiscan 10/23/2016  This is a low risk study.  Nuclear stress EF: 47%.  STUDIES:  CT HEAD 9/22>>> Negative    SIGNIFICANT EVENTS  ETT 9/23>> RIGHT IJ CVC 9/23>> A-LINE 9/23 >>9/28 9/24 -  paralyzed, sedated.  Remains on epi gtt r/t bradycardia, levophed 10mcg.  9/24 CRRT>9/28  Off sedation since 9/25 - starting to wake slowly.     SUBJECTIVE/OVERNIGHT/INTERVAL HX Off CRRT. Awake.  Tolerating PS 15/5. Nods, follows commands.    VITAL SIGNS: Temp:  [99.5 F (37.5 C)-100.4 F (38 C)] 99.9 F (37.7 C) (10/01 0900) Pulse Rate:  [71-90] 75 (10/01 0900) Resp:  [16-26] 26 (10/01 0900) BP: (68-149)/(50-89) 123/65 (10/01 0900) SpO2:  [93 %-100 %] 94 % (10/01 0900) FiO2 (%):  [40 %] 40 % (10/01 0749) Weight:  [95.1 kg (209 lb 10.5 oz)] 95.1 kg (209 lb 10.5 oz) (10/01 0400)   General:  Chronically ill appearing male, NAD  HEENT: ETT, MM pink/moist Neuro: awake, alert, follows commands CV: s1s2 rrr, no m/r/g PULM: resps even/non-labored on PS 15/5, lfew scattered rhonchi  ZH:GDJM, non-tender, bsx4 active  Extremities: warm/dry, scant BLE edema  Skin: no rashes or lesions   PULMONARY  Recent Labs Lab 10/31/16 1116  PHART 7.552*    PCO2ART 27.3*  PO2ART 160*  HCO3 24.0  O2SAT 99.0    CBC  Recent Labs Lab 11/03/16 0512 11/04/16 0415 11/05/16 0356  HGB 11.5* 10.9* 10.6*  HCT 35.1* 33.5* 32.9*  WBC 7.6 8.7 10.1  PLT 171 210 246    COAGULATION No results for input(s): INR in the last 168 hours.  CARDIAC   No results for input(s): TROPONINI in the last 168 hours. No results for input(s): PROBNP in the last 168 hours.   CHEMISTRY  Recent Labs Lab 11/01/16 1622 11/01/16 1628 11/02/16 0251 11/03/16 0512 11/04/16 0415 11/05/16 0356  NA 138  --  139 139 142 147*  K 4.0  --  3.9 3.7 3.6 3.7  CL 104  --  103 102 107 109  CO2 25  --  27 25 24 25   GLUCOSE 102*  --  89 103* 92 101*  BUN 35*  --  33* 65* 87* 104*  CREATININE 2.51*  --  2.42* 3.82* 4.75* 5.36*  CALCIUM 8.6*  --  8.7* 9.0 9.0 8.9  MG  --  2.3 2.3 2.5* 2.6* 2.7*  PHOS 2.6  --  3.2 4.1 5.5* 6.9*   Estimated Creatinine Clearance: 12.9 mL/min (A) (by C-G formula based on SCr of 5.36 mg/dL (H)).   LIVER  Recent Labs Lab 11/01/16 1622 11/02/16 0251 11/03/16 0512 11/04/16 0415 11/05/16 0356  ALBUMIN 2.5* 2.7* 2.7* 2.5* 2.6*     INFECTIOUS  Recent Labs Lab 10/29/16 4268 10/30/16 3419 10/31/16 0335  PROCALCITON 93.09 61.68 44.93     ENDOCRINE CBG (last 3)   Recent Labs  11/04/16 2312 11/05/16 0401 11/05/16 0746  GLUCAP 107* 94 91       IMAGING x48h  - image(s) personally visualized  -   highlighted in bold Dg Chest Port 1 View  Result Date: 11/05/2016 CLINICAL DATA:  Respiratory failure, intubated patient. History of CHF, bradycardia, acute renal insufficiency. EXAM: PORTABLE CHEST 1 VIEW COMPARISON:  Portable chest x-ray of November 02, 2016 FINDINGS: The lungs are adequately inflated. The interstitial markings remain mildly increased. The retrocardiac region on the left remains dense and there is persistent obscuration of the left hemidiaphragm. The cardiac silhouette is top-normal to mildly enlarged. The  pulmonary vascularity is mildly engorged. There is calcification in the wall of the thoracic aorta. The endotracheal tube tip lies 3.7 cm above the carina. The esophagogastric tube tip projects below the inferior margin of the image. The right internal jugular Cordis sheath tip projects over the junction of the proximal and midportions of the SVC. IMPRESSION: Persistent bibasilar subsegmental atelectasis. No significant pleural effusion. Mild interstitial prominence consistent with low-grade CHF. The support tubes are in reasonable position. Thoracic aortic atherosclerosis. Electronically Signed   By: David  Martinique M.D.   On: 11/05/2016 07:44      ASSESSMENT / PLAN:  PULMONARY A: Acute resp failure r/t volume overload +/- ALI S/p nimbex x 48h Hx OSA  P:   Vent support - 8cc/kg  vap protocol Daily SBT  Continues to improve - initially concerned that he may require trach but now likely working towards extubation  Check cxr in am .  Volume removal as able per renal    CARDIOVASCULAR A:  H/o AFib on apixaban and amio prior to admit .  admitted with bradycardia following timolol eye drop - initially required epi gtt Diastolic CHF  Cardiogenic Shock -resolved.   P:  MAP goal > 85 Amio on hold Cards recommends 30 day monitor on d/c  ?when to restart eliquis and amiodarone    RENAL Acute on CKD IV     P:   Renal following  Suspect will need iHD today  If renal suspects he will need ongoing HD on d/c, should consider placing perm cath asap and d/c femoral HD cath  F/u chem    GASTROINTESTINAL A:   NPO  P:   TF PPI  HEMATOLOGIC A:   #RBC: mild anemia critical illness #Platelet mild thrombocytopenia - improving #WBC lekyocytosis -resolved  P:  F/u CBC  SCD's /Hep for DVT prophylaxis  Need to resume home eliquis at some point   INFECTIOUS  A:   MSSA PCR + and PCT c/w Septic shock - likely aspiration pneumonia PLAN -  Continue zosyn     ENDOCRINE A:     DM-2 Hypothyroid P:   ICU hyperglycemia protocol Continue home synthroid - TSH 2.561  NEUROLOGIC A:   Acute encephalopathy noted post admission - improving slowly.  CT head neg  P:   RASS goal: 0 Supportive care  Avoid sedation   PT/OT    FAMILY  - Updates: wife not at bedside 10/1  - Inter-disciplinary family meet or Palliative Care meeting due by:  DAy [place], NP 11/05/2016  9:32 AM Pager: [phone] or [phone]  Attending Note:  76 year old male with bradycardic events after beta blocker eye drops.  Patient never arrested.  Was supported in the ICU.  Today is the  first day he is more interactive and weaning but on 12/5 on exam.  I reviewed CXR myself, ETT is in good position.  Will continue weaning efforts.  No extubation today.  Continue diureses.  D/C CVVH.  HD intermittently.  Will need a perm cath so we can remove the femoral line.  PCCM will continue to follow.  The patient is critically ill with multiple organ systems failure and requires high complexity decision making for assessment and support, frequent evaluation and titration of therapies, application of advanced monitoring technologies and extensive interpretation of multiple databases.   Critical Care Time devoted to patient care services described in this note is  35  Minutes. This time reflects time of care of this signee Dr Jennet Maduro. This critical care time does not reflect procedure time, or teaching time or supervisory time of PA/NP/Med student/Med Resident etc but could involve care discussion time.  Rush Farmer, M.D. [facility] Medicine. Pager: [phone]. After hours pager: [phone].

## 2016-11-05 NOTE — Progress Notes (Signed)
[facility] Rounding Note  Subjective:    Remains in ICU  Still off pressors Weaning sedation today UOP 2020 yesterday, current UOP 175-175/hour (no lasix) CRRT off 9/28 Creatinine has not yet reached plateau post D/C of CRRT  Objective Vital signs in last 24 hours: Vitals:   11/05/16 0800 11/05/16 0900 11/05/16 1000 11/05/16 1100  BP: 131/63 123/65 132/66 (!) 149/73  Pulse: 77 75 73 81  Resp: (!) 23 (!) 26 (!) 32 (!) 28  Temp: 99.9 F (37.7 C) 99.9 F (37.7 C) 100 F (37.8 C) (!) 100.4 F (38 C)  TempSrc:      SpO2: 93% 94% 93% 94%  Weight:      Height:       Weight change: -0.5 kg (-1 lb 1.6 oz)  Intake/Output Summary (Last 24 hours) at 11/05/16 1140 Last data filed at 11/05/16 1100  Gross per 24 hour  Intake             1525 ml  Output             2395 ml  Net             -870 ml   Physical Exam: Not following commands for me but RN says was awake all night and did follow for her ETT/foley/R IJ 3L, R fem vas cath Hands in mittens S1S2 No S3 Distant heart sounds On vent, ant fairly clear Abdomen: obese, soft, non tender Extremities: minimal if any edema  Dialysis Access: femoral vascath placed 9/24    Recent Labs Lab 11/03/16 0512 11/04/16 0415 11/05/16 0356  NA 139 142 147*  K 3.7 3.6 3.7  CL 102 107 109  CO2 25 24 25  GLUCOSE 103* 92 101*  BUN 65* 87* 104*  CREATININE 3.82* 4.75* 5.36*  CALCIUM 9.0 9.0 8.9  PHOS 4.1 5.5* 6.9*     Recent Labs Lab 11/03/16 0512 11/04/16 0415 11/05/16 0356  ALBUMIN 2.7* 2.5* 2.6*    Recent Labs Lab 11/01/16 0341 11/02/16 0251 11/03/16 0512 11/04/16 0415 11/05/16 0356  WBC 5.4 5.9 7.6 8.7 10.1  NEUTROABS 3.9 4.5 5.6  --   --   HGB 11.7* 11.6* 11.5* 10.9* 10.6*  HCT 35.0* 35.6* 35.1* 33.5* 32.9*  MCV 92.3 94.4 93.9 94.9 95.9  PLT 152 144* 171 210 246    Recent Labs Lab 11/04/16 1626 11/04/16 1933 11/04/16 2312 11/05/16 0401 11/05/16 0746  GLUCAP 99 91 107* 94 91   Studies/Results: Dg Chest Port  1 View  Result Date: 11/05/2016 CLINICAL DATA:  Respiratory failure, intubated patient. History of CHF, bradycardia, acute renal insufficiency. EXAM: PORTABLE CHEST 1 VIEW COMPARISON:  Portable chest x-ray of November 02, 2016 FINDINGS: The lungs are adequately inflated. The interstitial markings remain mildly increased. The retrocardiac region on the left remains dense and there is persistent obscuration of the left hemidiaphragm. The cardiac silhouette is top-normal to mildly enlarged. The pulmonary vascularity is mildly engorged. There is calcification in the wall of the thoracic aorta. The endotracheal tube tip lies 3.7 cm above the carina. The esophagogastric tube tip projects below the inferior margin of the image. The right internal jugular Cordis sheath tip projects over the junction of the proximal and midportions of the SVC. IMPRESSION: Persistent bibasilar subsegmental atelectasis. No significant pleural effusion. Mild interstitial prominence consistent with low-grade CHF. The support tubes are in reasonable position. Thoracic aortic atherosclerosis. Electronically Signed   By: David  Jordan M.D.   On: 11/05/2016 07:44   Medications: Infusions: .   sodium chloride Stopped (10/27/16 1515)  . sodium chloride    . sodium chloride 10 mL/hr at 11/04/16 1800  . piperacillin-tazobactam (ZOSYN)  IV Stopped (11/05/16 0614)    Scheduled Medications: . chlorhexidine gluconate (MEDLINE KIT)  15 mL Mouth Rinse BID  . Chlorhexidine Gluconate Cloth  6 each Topical Daily  . feeding supplement (PRO-STAT SUGAR FREE 64)  60 mL Per Tube BID  . feeding supplement (VITAL HIGH PROTEIN)  1,000 mL Per Tube Q24H  . heparin  5,000 Units Subcutaneous Q8H  . insulin aspart  0-15 Units Subcutaneous Q4H  . levothyroxine  175 mcg Oral QAC breakfast  . mouth rinse  15 mL Mouth Rinse 10 times per day  . pantoprazole sodium  40 mg Per Tube Q1200  . sodium chloride flush  10-40 mL Intracatheter Q12H    Assessment/  Plan: 76 y.o. yo male PMH DM, CHF, T4 ds, gout, HLD, AF,, baseline CKD Stage 4 (followed at [facility], AVF plans and CKD planning underway week prior to admission).  Admitted on 10/27/2016 poorly responsive after eye surgery w/profound bradycardia. AKI on CKD. CRRT 9/25-9/28.   1. Non oliguric A on CKD4 - plans were starting to be made for dialysis as OP prior to current event. At time of consultation pt was uremic and in the setting of being critically ill even though no absolute indications was felt best to initiate dialysis in order for him to overall improve. Daughter agreed -->  CRRT 9/25-9/28. Labs and volume status improved, CRRT stopped 9/28. Creatinine has not yet reached plateau post CRRT (and not sure what to expect either). VascCath still in (femoral) . BUN now close to 100. No other urgent HD indications but with current rate of rise of BUN/creatinine would benefit from additional HD today mostly for clearance, then pull femoral catheter (in since 9/25) and if additional HD needed then get TDC in later in the week . (Excellent UOP without diuretics).  2. Anemia- hgb still >10. No intervention yet needed.  3. Acute bradycardic event post eye surgery - nuclear EF 47%, low risk for ischemia. Trops neg. No other inpt w/u planned. For 30 day monitor after discharge. Cards has signed off. 4. HCAP zosyn 5. DM per primary team 6. Hypothyroidism getting levothyroxine 7. HLD   , MD [facility] [phone] Pager 11/05/2016, 11:49 AM         

## 2016-11-05 NOTE — Progress Notes (Addendum)
ANTICOAGULATION CONSULT NOTE - Initial Consult  Pharmacy Consult for coumadin Indication: atrial fibrillation  No Known Allergies  Patient Measurements: Height: _0  (172.7 cm) Weight: 209 lb 10.5 oz (95.1 kg) IBW/kg (Calculated) : 68.4   Vital Signs: Temp: 100.6 F (38.1 C) (10/01 1400) Temp Source: Core (Comment) (10/01 1202) BP: 146/76 (10/01 1400) Pulse Rate: 81 (10/01 1400)  Labs:  Recent Labs  11/03/16 0512 11/04/16 0415 11/05/16 0356  HGB 11.5* 10.9* 10.6*  HCT 35.1* 33.5* 32.9*  PLT 171 210 246  CREATININE 3.82* 4.75* 5.36*    Estimated Creatinine Clearance: 12.9 mL/min (A) (by C-G formula based on SCr of 5.36 mg/dL (H)).   Medical History: Past Medical History:  Diagnosis Date  . CHF (congestive heart failure) ([place])   . Diabetes mellitus without complication ([place])   . Thyroid disease     Medications:  Scheduled:  . chlorhexidine gluconate (MEDLINE KIT)  15 mL Mouth Rinse BID  . Chlorhexidine Gluconate Cloth  6 each Topical Daily  . feeding supplement (PRO-STAT SUGAR FREE 64)  60 mL Per Tube BID  . feeding supplement (VITAL HIGH PROTEIN)  1,000 mL Per Tube Q24H  . heparin  5,000 Units Subcutaneous Q8H  . insulin aspart  0-15 Units Subcutaneous Q4H  . levothyroxine  175 mcg Oral QAC breakfast  . mouth rinse  15 mL Mouth Rinse 10 times per day  . pantoprazole sodium  40 mg Per Tube Q1200  . sodium chloride flush  10-40 mL Intracatheter Q12H    Assessment: 76 yo male with history of afib on apixaban PTA.  He is now noted with CKD with plans for HD.  Pharmacy consulted to dose warfarin -Last INR was 2.0 on 9/22 (possibly due to apixaban PTA) -LFTs 361/290 and T bili= 1.9 on 9/22 -Hg and platelets stable  Goal of Therapy:  Heparin level 0.3-0.7 units/ml Monitor platelets by anticoagulation protocol: Yes   Plan:  -Coumadin 2.7m po tonight (as long as no INR elevation after INR draw today) -INR  Today -LFTs in am -Daily PT/INR   AHildred Laser Pharm D 11/05/2016 3:01 PM

## 2016-11-05 NOTE — Procedures (Signed)
I have personally attended this patient's dialysis session.   4K bath 1 liter UF Using temporary fem vas cath Tight heparin Will remove temp cath after HD  Jamal Maes, MD [facility] [phone] Pager 11/05/2016, 6:07 PM

## 2016-11-06 ENCOUNTER — Inpatient Hospital Stay (HOSPITAL_COMMUNITY): Payer: Medicare HMO

## 2016-11-06 DIAGNOSIS — R001 Bradycardia, unspecified: Secondary | ICD-10-CM

## 2016-11-06 DIAGNOSIS — I469 Cardiac arrest, cause unspecified: Secondary | ICD-10-CM

## 2016-11-06 LAB — GLUCOSE, CAPILLARY
GLUCOSE-CAPILLARY: 95 mg/dL (ref 65–99)
GLUCOSE-CAPILLARY: 104 mg/dL — AB (ref 65–99)
GLUCOSE-CAPILLARY: 68 mg/dL (ref 65–99)
GLUCOSE-CAPILLARY: 117 mg/dL — AB (ref 65–99)
Glucose-Capillary: 78 mg/dL (ref 65–99)
Glucose-Capillary: 55 mg/dL — ABNORMAL LOW (ref 65–99)

## 2016-11-06 LAB — HEPATITIS B SURFACE ANTIBODY,QUALITATIVE: HEP B S AB: NONREACTIVE

## 2016-11-06 LAB — RENAL FUNCTION PANEL
ALBUMIN: 2.6 g/dL — AB (ref 3.5–5.0)
Anion gap: 9 (ref 5–15)
BUN: 55 mg/dL — AB (ref 6–20)
CO2: 27 mmol/L (ref 22–32)
CREATININE: 3.59 mg/dL — AB (ref 0.61–1.24)
Calcium: 8.7 mg/dL — ABNORMAL LOW (ref 8.9–10.3)
Chloride: 106 mmol/L (ref 101–111)
GFR calc Af Amer: 17 mL/min — ABNORMAL LOW (ref >60)
GFR, EST NON AFRICAN AMERICAN: 15 mL/min — AB (ref >60)
GLUCOSE: 99 mg/dL (ref 65–99)
PHOSPHORUS: 5 mg/dL — AB (ref 2.5–4.6)
Potassium: 3.7 mmol/L (ref 3.5–5.1)
SODIUM: 142 mmol/L (ref 135–145)

## 2016-11-06 LAB — MAGNESIUM: Magnesium: 2.2 mg/dL (ref 1.7–2.4)

## 2016-11-06 LAB — CBC
HCT: 33.3 % — ABNORMAL LOW (ref 39.0–52.0)
Hemoglobin: 10.5 g/dL — ABNORMAL LOW (ref 13.0–17.0)
MCH: 30.4 pg (ref 26.0–34.0)
MCHC: 31.5 g/dL (ref 30.0–36.0)
MCV: 96.5 fL (ref 78.0–100.0)
PLATELETS: 256 10*3/uL (ref 150–400)
RBC: 3.45 MIL/uL — ABNORMAL LOW (ref 4.22–5.81)
RDW: 16 % — AB (ref 11.5–15.5)
WBC: 11.1 10*3/uL — ABNORMAL HIGH (ref 4.0–10.5)

## 2016-11-06 LAB — TRIGLYCERIDES: TRIGLYCERIDES: 88 mg/dL (ref <150)

## 2016-11-06 LAB — HEPATITIS B CORE ANTIBODY, TOTAL: Hep B Core Total Ab: NEGATIVE

## 2016-11-06 LAB — PARATHYROID HORMONE, INTACT (NO CA): PTH: 92 pg/mL — ABNORMAL HIGH (ref 15–65)

## 2016-11-06 LAB — HEPATIC FUNCTION PANEL
ALT: 183 U/L — ABNORMAL HIGH (ref 17–63)
AST: 72 U/L — AB (ref 15–41)
Albumin: 2.6 g/dL — ABNORMAL LOW (ref 3.5–5.0)
Alkaline Phosphatase: 185 U/L — ABNORMAL HIGH (ref 38–126)
BILIRUBIN DIRECT: 1.1 mg/dL — AB (ref 0.1–0.5)
BILIRUBIN INDIRECT: 1.3 mg/dL — AB (ref 0.3–0.9)
BILIRUBIN TOTAL: 2.4 mg/dL — AB (ref 0.3–1.2)
Total Protein: 6.9 g/dL (ref 6.5–8.1)

## 2016-11-06 LAB — PROTIME-INR
INR: 1.24
PROTHROMBIN TIME: 15.5 s — AB (ref 11.4–15.2)

## 2016-11-06 LAB — HEPATITIS B SURFACE ANTIGEN: HEP B S AG: NEGATIVE

## 2016-11-06 MED ORDER — WARFARIN SODIUM 2.5 MG PO TABS
2.5000 mg | ORAL_TABLET | Freq: Once | ORAL | Status: DC
  Filled 2016-11-06: qty 1

## 2016-11-06 MED ORDER — PROPOFOL 1000 MG/100ML IV EMUL
0.0000 ug/kg/min | INTRAVENOUS | Status: DC
Start: 2016-11-06 — End: 2016-11-06
  Administered 2016-11-06: 5 ug/kg/min via INTRAVENOUS
  Filled 2016-11-06: qty 100

## 2016-11-06 MED ORDER — DEXTROSE 50 % IV SOLN
INTRAVENOUS | Status: AC
  Filled 2016-11-06: qty 50

## 2016-11-06 MED ORDER — DEXTROSE 50 % IV SOLN
INTRAVENOUS | Status: AC
  Administered 2016-11-06: 25 mL via INTRAVENOUS
  Filled 2016-11-06: qty 50

## 2016-11-06 MED ORDER — ORAL CARE MOUTH RINSE
15.0000 mL | Freq: Two times a day (BID) | OROMUCOSAL | Status: DC
  Administered 2016-11-06 – 2016-11-09 (×4): 15 mL via OROMUCOSAL

## 2016-11-06 MED ORDER — WARFARIN SODIUM 2.5 MG PO TABS: 2.5000 mg | ORAL_TABLET | Freq: Once | ORAL | Status: DC

## 2016-11-06 MED ORDER — DEXTROSE 10 % IV SOLN
INTRAVENOUS | Status: DC
  Administered 2016-11-06 – 2016-11-08 (×2): via INTRAVENOUS
  Filled 2016-11-06: qty 1000

## 2016-11-06 MED ORDER — DEXTROSE 50 % IV SOLN
25.0000 mL | Freq: Once | INTRAVENOUS | Status: AC
Start: 2016-11-06 — End: 2016-11-06
  Administered 2016-11-06: 25 mL via INTRAVENOUS

## 2016-11-06 MED ORDER — DEXTROSE 50 % IV SOLN
25.0000 mL | Freq: Once | INTRAVENOUS | Status: AC
  Administered 2016-11-06: 25 mL via INTRAVENOUS

## 2016-11-06 MED ORDER — WARFARIN - PHARMACIST DOSING INPATIENT
Freq: Every day | Status: DC
  Administered 2016-11-07: 18:00:00

## 2016-11-06 NOTE — Progress Notes (Signed)
CBG 55.  Half amp of D50 administered per hypoglycemia protocol.  eLink paged and order received to begin D10 infusion at 30 ml/hr.  15 min CBG recheck: 117.   Will continue to monitor.

## 2016-11-06 NOTE — Progress Notes (Signed)
[facility] Rounding Note  Subjective:    HD 10/1 1 liter off Fem cath removed Still making good urine (1545 plus 1000 off with HD) Remains intubated but in hopes of extubation today  Objective Vital signs in last 24 hours: Vitals:   11/06/16 0500 11/06/16 0600 11/06/16 0700 11/06/16 0800  BP: (!) 91/58 (!) 90/59 (!) 93/58 111/67  Pulse: 67 66 66 72  Resp: _0 (!) 23  Temp: 99 F (37.2 C)  99 F (37.2 C) 98.6 F (37 C)  TempSrc:      SpO2: 93% 92% 93% 92%  Weight:      Height:       Weight change: -3 kg (-6 lb 9.8 oz)  Intake/Output Summary (Last 24 hours) at 11/06/16 0906 Last data filed at 11/06/16 0830  Gross per 24 hour  Intake           1442.2 ml  Output             2270 ml  Net           -827.8 ml   Physical Exam: Family in with patient Awake, follows commands Wife in with him ETT/foley/R IJ 3L Hands in mittens S1S2 No S3 Distant heart sounds On vent, ant fairly clear Abdomen: obese, soft, non tender Extremities no edema whatsoever  Dialysis Access: femoral vascath has been removed    Recent Labs Lab 11/04/16 0415 11/05/16 0356 11/06/16 0319  NA 142 147* 142  K 3.6 3.7 3.7  CL 107 109 106  CO2 _1 GLUCOSE 92 101* 99  BUN 87* 104* 55*  CREATININE 4.75* 5.36* 3.59*  CALCIUM 9.0 8.9 8.7*  PHOS 5.5* 6.9* 5.0*     Recent Labs Lab 11/04/16 0415 11/05/16 0356 11/06/16 0319  AST  --   --  72*  ALT  --   --  183*  ALKPHOS  --   --  185*  BILITOT  --   --  2.4*  PROT  --   --  6.9  ALBUMIN 2.5* 2.6* 2.6*  2.6*    Recent Labs Lab 11/01/16 0341 11/02/16 0251 11/03/16 0512 11/04/16 0415 11/05/16 0356 11/06/16 0319  WBC 5.4 5.9 7.6 8.7 10.1 11.1*  NEUTROABS 3.9 4.5 5.6  --   --   --   HGB 11.7* 11.6* 11.5* 10.9* 10.6* 10.5*  HCT 35.0* 35.6* 35.1* 33.5* 32.9* 33.3*  MCV 92.3 94.4 93.9 94.9 95.9 96.5  PLT 152 144* 171 210 246 256    Recent Labs Lab 11/05/16 1559 11/05/16 1952 11/05/16 2317 11/06/16 0327 11/06/16 0838   GLUCAP 87 98 93 95 104*   Studies/Results: Dg Chest Portable 1 View  Result Date: 11/06/2016 CLINICAL DATA:  76 year old male with respiratory failure. Subsequent encounter. EXAM: PORTABLE CHEST 1 VIEW COMPARISON:  11/05/2016. FINDINGS: Rotation to the left. Endotracheal tube tip 4.2 cm above the carina. Nasogastric tube courses below the diaphragm. Tip is not included on the present exam. Right central line tip proximal superior vena cava level. Heart may be top-normal to slightly enlarged. Left base opacification suggestive of atelectasis with small pleural effusion. Pulmonary vascular congestion/mild congestive heart failure. No gross pneumothorax. Calcified slightly tortuous aorta. IMPRESSION: Rotated exam. Left base atelectasis/small pleural effusion. Pulmonary vascular congestion/mild pulmonary edema similar to prior exam. Aortic Atherosclerosis (ICD10-I70.0). Electronically Signed   By: Genia Del M.D.   On: 11/06/2016 07:52   Dg Chest Port 1 View  Result Date: 11/05/2016 CLINICAL DATA:  Respiratory  failure, intubated patient. History of CHF, bradycardia, acute renal insufficiency. EXAM: PORTABLE CHEST 1 VIEW COMPARISON:  Portable chest x-ray of November 02, 2016 FINDINGS: The lungs are adequately inflated. The interstitial markings remain mildly increased. The retrocardiac region on the left remains dense and there is persistent obscuration of the left hemidiaphragm. The cardiac silhouette is top-normal to mildly enlarged. The pulmonary vascularity is mildly engorged. There is calcification in the wall of the thoracic aorta. The endotracheal tube tip lies 3.7 cm above the carina. The esophagogastric tube tip projects below the inferior margin of the image. The right internal jugular Cordis sheath tip projects over the junction of the proximal and midportions of the SVC. IMPRESSION: Persistent bibasilar subsegmental atelectasis. No significant pleural effusion. Mild interstitial prominence  consistent with low-grade CHF. The support tubes are in reasonable position. Thoracic aortic atherosclerosis. Electronically Signed   By: David  Martinique M.D.   On: 11/05/2016 07:44   Medications: Infusions: . sodium chloride Stopped (10/27/16 1515)  . sodium chloride    . sodium chloride 10 mL/hr at 11/06/16 0349    Scheduled Medications: . chlorhexidine gluconate (MEDLINE KIT)  15 mL Mouth Rinse BID  . Chlorhexidine Gluconate Cloth  6 each Topical Daily  . feeding supplement (PRO-STAT SUGAR FREE 64)  60 mL Per Tube BID  . feeding supplement (VITAL HIGH PROTEIN)  1,000 mL Per Tube Q24H  . insulin aspart  0-15 Units Subcutaneous Q4H  . levothyroxine  175 mcg Oral QAC breakfast  . mouth rinse  15 mL Mouth Rinse 10 times per day  . pantoprazole sodium  40 mg Per Tube Q1200  . patient's guide to using coumadin book   Does not apply Once  . sodium chloride flush  10-40 mL Intracatheter Q12H  . warfarin  2.5 mg Oral ONCE-1800  . Warfarin - Pharmacist Dosing Inpatient   Does not apply q1800    Assessment/ Plan: 76 y.o. yo male PMH DM, CHF, T4 ds, gout, HLD, AF, baseline CKD Stage 4 (followed at [facility]), AVF plans and CKD planning underway week prior to admission).  Admitted 10/27/2016 poorly responsive after eye surgery w/profound bradycardia. AKI on CKD. CRRT 9/25-9/28.   1. Non oliguric A on CKD4 - plans were starting to be made for dialysis as OP prior to current event. At time of consultation pt was uremic and in the setting of being critically ill even though no absolute indications was felt best to initiate dialysis in order for him to overall improve. Daughter agreed -->  CRRT 9/25-9/28. Labs and volume status improved, CRRT stopped 9/28. HD 10/1 2/2 BUN close to 100, no plateau in creatinine. Still with good UOP. No indication for HD today. If more HD needed will have to pursue [facility]. Trending labs.  2. Anemia- hgb still >10. No intervention yet needed.  3. Acute bradycardic event  post eye surgery - nuclear EF 47%, low risk for ischemia. Trops neg. No other inpt w/u planned. For 30 day monitor after discharge. Cards has signed off. 4. HCAP zosyn completed 5. DM per primary team 6. Hypothyroidism getting levothyroxine 7. [place], MD [place] Pager 11/06/2016, 9:06 AM

## 2016-11-06 NOTE — Evaluation (Signed)
Clinical/Bedside Swallow Evaluation Patient Details  Name: Omar Lambert MRN: [id number] Date of Birth: 03/01/1940  Today's Date: 11/06/2016 Time: SLP Start Time (ACUTE ONLY): 49 SLP Stop Time (ACUTE ONLY): 1614 SLP Time Calculation (min) (ACUTE ONLY): 24 min  Past Medical History:  Past Medical History:  Diagnosis Date  . CHF (congestive heart failure) ([place])   . Diabetes mellitus without complication ([place])   . Thyroid disease    Past Surgical History: History reviewed. No pertinent surgical history. HPI:  Pt is a 76 yo male with PMHX CHF, CKD STAGE III/IV, Afib RVR, Aflutter, DM presents after opthalmic surgery on 9/21 AM. Wife found him unresponsive at home that night around midnight. Pt was bradycardic and unresponsive but never lost pulse. He was intubated 9/23-10/2.   Assessment / Plan / Recommendation Clinical Impression  Pt's voice is mildly hoarse but mostly low in intensity following prolonged intubation, and his cough seems weak. Trials of water elicit multiple subswallows per bolus and audible wetness, although with no coughing and minimal throat clearing elicited. Given his clinical presentation I am concerned for an acute, post-extubation dysphagia that may be impacting his ability to protect his airway. Given concern for decreased cough response, recommend proceeding with FEES to better assess oropharyngeal function prior to initiating POs. Will f/u for readiness on next date. SLP Visit Diagnosis: Dysphagia, unspecified (R13.10)    Aspiration Risk  Moderate aspiration risk    Diet Recommendation NPO   Medication Administration: Via alternative means    Other  Recommendations Oral Care Recommendations: Oral care QID   Follow up Recommendations        Frequency and Duration            Prognosis Prognosis for Safe Diet Advancement: Good      Swallow Study   General HPI: Pt is a 76 yo male with PMHX CHF, CKD STAGE III/IV, Afib RVR, Aflutter, DM presents after  opthalmic surgery on 9/21 AM. Wife found him unresponsive at home that night around midnight. Pt was bradycardic and unresponsive but never lost pulse. He was intubated 9/23-10/2. Type of Study: Bedside Swallow Evaluation Previous Swallow Assessment: none in chart Diet Prior to this Study: NPO Temperature Spikes Noted: Yes (100.6) Respiratory Status: Nasal cannula History of Recent Intubation: Yes Length of Intubations (days): 10 days Date extubated: 11/06/16 Behavior/Cognition: Alert;Cooperative;Other (Comment) (mildly delayed) Oral Cavity Assessment: Within Functional Limits Oral Care Completed by SLP: Yes Oral Cavity - Dentition: Edentulous;Dentures, not available Vision: Functional for self-feeding Self-Feeding Abilities: Able to feed self;Needs assist Patient Positioning: Upright in bed Baseline Vocal Quality: Hoarse;Low vocal intensity Volitional Cough: Weak Volitional Swallow: Able to elicit    Oral/Motor/Sensory Function Overall Oral Motor/Sensory Function: Generalized oral weakness   Ice Chips Ice chips: Within functional limits Presentation: Spoon   Thin Liquid Thin Liquid: Impaired Presentation: Cup;Self Fed;Spoon Pharyngeal  Phase Impairments: Suspected delayed Swallow;Multiple swallows;Wet Vocal Quality;Throat Clearing - Delayed    Nectar Thick Nectar Thick Liquid: Not tested   Honey Thick Honey Thick Liquid: Not tested   Puree Puree: Not tested   Solid   GO   Solid: Not tested        [name] 11/06/2016,4:33 PM   [name], M.A. CCC-SLP [phone]

## 2016-11-06 NOTE — Progress Notes (Signed)
eLink Physician-Brief Progress Note Patient Name: Omar Lambert DOB: 04/16/1940 MRN: [id number]   Date of Service  11/06/2016  HPI/Events of Note  Awake, pulling at tubes, lines  eICU Interventions  Propofol until morning per PAD protocol     Intervention Category Major Interventions: Delirium, psychosis, severe agitation - evaluation and management  Simonne Maffucci 11/06/2016, 12:32 AM

## 2016-11-06 NOTE — Progress Notes (Signed)
[facility] Progress Note Patient Name: Omar Lambert DOB: 08/03/1940 MRN: [id number]   Date of Service  11/06/2016  HPI/Events of Note  Hypoglycemia, ESRD patient, NPO  eICU Interventions  D10 30cc/hr     Intervention Category Intermediate Interventions: Other:  Simonne Maffucci 11/06/2016, 7:55 PM

## 2016-11-06 NOTE — Progress Notes (Signed)
Name: Omar Lambert MRN: [id number] DOB: 1940/12/22    ADMISSION DATE:  10/27/2016 CONSULTATION DATE: 10/27/16  REFERRING MD : ER ATTENDING DELO  CHIEF COMPLAINT:  BRADYCARDIA, UNRESPONSIVE  BRIEF PATIENT DESCRIPTION: 76 YR OLD MALE W/ PMHX CHF, CKD STAGE III/IV ( seen by Nephro  and discussing possible dialysis), Afib RVR, A flutter, DM presents after opthalmic surgery on 9/21 AM. Post surgery wife found him unresponsive at home at midnight. Bradycardic and unresponsive never lost pulse. Intubated, given atropine, started on Epinephrine, BP 32D systolic highest Hr in 92E. PCCM consulted  STUDIES:  CT HEAD 9/22>>> Negative  EKG - atrial fibrillation with slow ventricular response with RBBB low QRS voltage ECHO  08/18/2015>> nml LVSF ,RVSP 40 Stress test nuclear lexiscan 10/23/2016 > Nuclear stress EF: 47%.  SIGNIFICANT EVENTS  ETT 9/23>> RIGHT IJ CVC 9/23>> A-LINE 9/23 >>9/28 9/24 -  paralyzed, sedated.  Remains on epi gtt r/t bradycardia, levophed 24mcg.  9/24 CRRT>9/28  Off sedation since 9/25 - starting to wake slowly.      SUBJECTIVE/OVERNIGHT/INTERVAL HX Had agitation overnight, required propofol.  Tolerating 8/5 PSV this AM.   VITAL SIGNS: Temp:  [98.6 F (37 C)-100.6 F (38.1 C)] 98.6 F (37 C) (10/02 0800) Pulse Rate:  [66-84] 72 (10/02 0800) Resp:  [16-34] 23 (10/02 0800) BP: (88-149)/(54-90) 111/67 (10/02 0800) SpO2:  [91 %-95 %] 92 % (10/02 0800) FiO2 (%):  [40 %] 40 % (10/02 0431) Weight:  [90.2 kg (198 lb 13.7 oz)-92.1 kg (203 lb 0.7 oz)] 90.2 kg (198 lb 13.7 oz) (10/02 0332)   General:  Chronically ill appearing male, anxious, trying to reach for ETT HEENT: ETT, MM pink/moist Neuro: awake, alert, follows commands CV: s1s2 rrr, no m/r/g PULM: resps even/non-labored on PS 8/5, CTAB QA:STMH, non-tender, bsx4 active  Extremities: warm/dry, scant BLE edema  Skin: no rashes or lesions   PULMONARY  Recent Labs Lab 10/31/16 1116  PHART 7.552*    PCO2ART 27.3*  PO2ART 160*  HCO3 24.0  O2SAT 99.0    CBC  Recent Labs Lab 11/04/16 0415 11/05/16 0356 11/06/16 0319  HGB 10.9* 10.6* 10.5*  HCT 33.5* 32.9* 33.3*  WBC 8.7 10.1 11.1*  PLT 210 246 256    COAGULATION  Recent Labs Lab 11/05/16 1545 11/06/16 0319  INR 1.15 1.24    CARDIAC   No results for input(s): TROPONINI in the last 168 hours. No results for input(s): PROBNP in the last 168 hours.   CHEMISTRY  Recent Labs Lab 11/02/16 0251 11/03/16 0512 11/04/16 0415 11/05/16 0356 11/06/16 0319  NA 139 139 142 147* 142  K 3.9 3.7 3.6 3.7 3.7  CL 103 102 107 109 106  CO2 27 25 24 25 27   GLUCOSE 89 103* 92 101* 99  BUN 33* 65* 87* 104* 55*  CREATININE 2.42* 3.82* 4.75* 5.36* 3.59*  CALCIUM 8.7* 9.0 9.0 8.9 8.7*  MG 2.3 2.5* 2.6* 2.7* 2.2  PHOS 3.2 4.1 5.5* 6.9* 5.0*   Estimated Creatinine Clearance: 18.8 mL/min (A) (by C-G formula based on SCr of 3.59 mg/dL (H)).   LIVER  Recent Labs Lab 11/02/16 0251 11/03/16 0512 11/04/16 0415 11/05/16 0356 11/05/16 1545 11/06/16 0319  AST  --   --   --   --   --  72*  ALT  --   --   --   --   --  183*  ALKPHOS  --   --   --   --   --  185*  BILITOT  --   --   --   --   --  2.4*  PROT  --   --   --   --   --  6.9  ALBUMIN 2.7* 2.7* 2.5* 2.6*  --  2.6*  2.6*  INR  --   --   --   --  1.15 1.24     INFECTIOUS  Recent Labs Lab 10/31/16 0335  PROCALCITON 44.93     ENDOCRINE CBG (last 3)   Recent Labs  11/05/16 1952 11/05/16 2317 11/06/16 0327  GLUCAP 98 93 95       IMAGING x48h  - image(s) personally visualized  -   highlighted in bold Dg Chest Portable 1 View  Result Date: 11/06/2016 CLINICAL DATA:  76 year old male with respiratory failure. Subsequent encounter. EXAM: PORTABLE CHEST 1 VIEW COMPARISON:  11/05/2016. FINDINGS: Rotation to the left. Endotracheal tube tip 4.2 cm above the carina. Nasogastric tube courses below the diaphragm. Tip is not included on the present exam. Right  central line tip proximal superior vena cava level. Heart may be top-normal to slightly enlarged. Left base opacification suggestive of atelectasis with small pleural effusion. Pulmonary vascular congestion/mild congestive heart failure. No gross pneumothorax. Calcified slightly tortuous aorta. IMPRESSION: Rotated exam. Left base atelectasis/small pleural effusion. Pulmonary vascular congestion/mild pulmonary edema similar to prior exam. Aortic Atherosclerosis (ICD10-I70.0). Electronically Signed   By: Genia Del M.D.   On: 11/06/2016 07:52   Dg Chest Port 1 View  Result Date: 11/05/2016 CLINICAL DATA:  Respiratory failure, intubated patient. History of CHF, bradycardia, acute renal insufficiency. EXAM: PORTABLE CHEST 1 VIEW COMPARISON:  Portable chest x-ray of November 02, 2016 FINDINGS: The lungs are adequately inflated. The interstitial markings remain mildly increased. The retrocardiac region on the left remains dense and there is persistent obscuration of the left hemidiaphragm. The cardiac silhouette is top-normal to mildly enlarged. The pulmonary vascularity is mildly engorged. There is calcification in the wall of the thoracic aorta. The endotracheal tube tip lies 3.7 cm above the carina. The esophagogastric tube tip projects below the inferior margin of the image. The right internal jugular Cordis sheath tip projects over the junction of the proximal and midportions of the SVC. IMPRESSION: Persistent bibasilar subsegmental atelectasis. No significant pleural effusion. Mild interstitial prominence consistent with low-grade CHF. The support tubes are in reasonable position. Thoracic aortic atherosclerosis. Electronically Signed   By: David  Martinique M.D.   On: 11/05/2016 07:44    ASSESSMENT / PLAN:  PULMONARY A: Acute resp failure r/t volume overload +/- ALI S/p nimbex x 48h  Left basilar atelectasis with small effusion Hx OSA  P:   Vent support - 8cc/kg  vap protocol Daily SBT  Continues  to improve - initially concerned that he may require trach but now likely working towards extubation  CXR in AM  CARDIOVASCULAR A:  H/o AFib on apixaban and amio prior to admit .  admitted with bradycardia following timolol eye drop - initially required epi gtt Diastolic CHF  Cardiogenic Shock -resolved.  P:  MAP goal > 85 Amio on hold Cards recommends 30 day monitor on d/c  ? when to restart amiodarone - defer to cards  RENAL Acute on CKD IV - improving s/p CRRT P:   Renal following If needs further HD will need tunneled cath F/u chem   GASTROINTESTINAL A:   NPO P:   TF, hold for anticipated extubation later today PPI  HEMATOLOGIC A:   Mild anemia  of critical illness P:  F/u CBC  SCD's Warfarin started 10/1 (instead of preadmission apixaban given potential for iHD).  INFECTIOUS A:   MSSA PCR + and PCT c/w Septic shock - likely aspiration pneumonia - s/p zosyn x 10 days P: Monitor clinically  ENDOCRINE A:   DM-2 Hypothyroid P:   SSI Continue home synthroid - TSH 2.561  NEUROLOGIC A:   Acute encephalopathy noted post admission - improving slowly.  CT head neg P:   RASS goal: 0 Supportive care  Avoid sedation (needed propofol overnight for agitation, holding this AM) PT/OT when extubated  FAMILY  - Updates: wife updated at bedside 10/2.  - Inter-disciplinary family meet or Palliative Care meeting due by:  DAy 7  CC time: 30 min.   Omar Lambert, [facility] Pager: [phone]  or [phone] 11/06/2016, 8:26 AM  Attending Note:  76 year old male with cardiac arrest and cardiogenic shock who is much improved today.  On exam, awake and following commands.  Weaning very well.  I reviewed CXR myself, ETT ok.  Will proceed with extubation.  Wife is bedside, who informs that patient never wanted the tube in in the first place.  I spoke with the patient who is on no sedation.  He confirms that he would not  want the ETT back in and would not want CPR or cardioversion.  Will extubate, make DNR and proceed with medical care.  The patient is critically ill with multiple organ systems failure and requires high complexity decision making for assessment and support, frequent evaluation and titration of therapies, application of advanced monitoring technologies and extensive interpretation of multiple databases.   Critical Care Time devoted to patient care services described in this note is  35  Minutes. This time reflects time of care of this signee Omar Lambert. This critical care time does not reflect procedure time, or teaching time or supervisory time of PA/NP/Med student/Med Resident etc but could involve care discussion time.  Omar Lambert, M.D. [facility] Pulmonary/Critical Care Medicine. Pager: [phone]. After hours pager: [phone].

## 2016-11-06 NOTE — Progress Notes (Signed)
Called e-link regarding patient condition. Patient anxious, agitated, and combative. Trying to get out of bed and reaching for ETT. Dr. Lake Bells camera'd in. Order to begin propofol infusion.

## 2016-11-06 NOTE — Progress Notes (Signed)
ANTICOAGULATION CONSULT NOTE - Follow Up Consult  Pharmacy Consult for Coumadin Indication: atrial fibrillation  No Known Allergies  Patient Measurements: Height: 5\' 8"  (172.7 cm) Weight: 198 lb 13.7 oz (90.2 kg) IBW/kg (Calculated) : 68.4  Vital Signs: Temp: 98.6 F (37 C) (10/02 0800) Temp Source: Core (Comment) (10/02 0000) BP: 111/67 (10/02 0800) Pulse Rate: 72 (10/02 0800)  Labs:  Recent Labs  11/04/16 0415 11/05/16 0356 11/05/16 1545 11/06/16 0319  HGB 10.9* 10.6*  --  10.5*  HCT 33.5* 32.9*  --  33.3*  PLT 210 246  --  256  LABPROT  --   --  14.6 15.5*  INR  --   --  1.15 1.24  CREATININE 4.75* 5.36*  --  3.59*    Estimated Creatinine Clearance: 18.8 mL/min (A) (by C-G formula based on SCr of 3.59 mg/dL (H)).  Assessment: 76yom on apixaban pta for afib, started on coumadin yesterday since now on dialysis. Coumadin score is 8 but lower dose started given elevated LFTs. INR 1.24 after first dose. CBC stable.  Goal of Therapy:  INR 2-3 Monitor platelets by anticoagulation protocol: Yes   Plan:  1) Coumadin 2.5mg  again tonight 2) Daily INR  Deboraha Sprang 11/06/2016,8:25 AM

## 2016-11-06 NOTE — Procedures (Signed)
Extubation Procedure Note  Patient Details:   Name: Omar Lambert DOB: [birth year] MRN: [id number]   Airway Documentation:     Evaluation  O2 sats: stable throughout Complications: No apparent complications Patient did tolerate procedure well. Bilateral Breath Sounds: Diminished   Yes   PT EXTUBATED TO 5 LPM [place] PER MD ORDER. PT STABLE THROUGHOUT WITH NO COMPLICATIONS. PT ABLE TO SPEAK AND HAS WEAK NONPRODUCTIVE COUGH. PT ENCOURAGE TO DEEP BREATHE AND USE YANKAUER TO CLEAR SECRETIONS. RT WILL CONTINUE TO MONITOR.  Elwin Mocha 11/06/2016, 10:33 AM

## 2016-11-06 NOTE — Progress Notes (Signed)
HD catheter removed. Pressure held x 5 mins. Dressing in place. Groin level zero.

## 2016-11-06 NOTE — Progress Notes (Signed)
Nutrition Follow-up  DOCUMENTATION CODES:   Obesity unspecified  INTERVENTION:   Monitor for diet advancement and supplement diet as appropriate Provide any additional diet education prior to d/c if changes to home diet   NUTRITION DIAGNOSIS:   Inadequate oral intake related to inability to eat as evidenced by NPO status. Ongoing.   GOAL:   Patient will meet greater than or equal to 90% of their needs Not met.   MONITOR:   Diet advancement, I & O's  ASSESSMENT:   Pt with PMH significant for CHF, CKD stage IV (seen by nephrologist this week, discussing possible dialysis), thyroid disease, and DM. Presents this admission with being poorly responsive after opthalmic surgery (9/21). Pt renal function worsening at this time, may possibly need CRRT.   10/2 pt extubated Pt wants to eat and drink, awaiting on SLP eval. RN to call SLP.   Wife at bedside. Per wife and pt he had a good appetite PTA. He had lost a lot of weight due to dietary changes. He went from 268 lb to 217 lb. Wife prepares all meals at home and he was following a low sodium/low carb diet at home. Wife was using Mrs Deliah Boston.  HD was being considered PTA but not yet started CRRT 9/25-9/28 HD 10/1 but no further treatments scheduled  Pt is - 3.5 L UOP 1000 ml x 24 hours  Medications reviewed and include: PO4 5 (H)   Diet Order:  Diet NPO time specified  Skin:   (DM R ankle wound)  Last BM:  10/2 small  Height:   Ht Readings from Last 1 Encounters:  11/01/16 '5\' 8"'  (1.727 m)    Weight:   Wt Readings from Last 1 Encounters:  11/06/16 198 lb 13.7 oz (90.2 kg)    Ideal Body Weight:  70 kg  BMI:  Body mass index is 30.24 kg/m.  Estimated Nutritional Needs:   Kcal:  2000-2200  Protein:  110-125 grams  Fluid:  2 L/day  EDUCATION NEEDS:   No education needs identified at this time  [place], [place], [place] Pager [phone] After Hours Pager

## 2016-11-06 NOTE — Evaluation (Signed)
Physical Therapy Evaluation Patient Details Name: Omar Lambert MRN: [id number] DOB: November 17, 1940 Today's Date: 11/06/2016   History of Present Illness   76 yo male with PMHX CHF, CKD STAGE III/IV ( seen by Nephro this week and discussing possible dialysis), Afib RVR, Aflutter, DM presents after opthalmic surgery on 9/21 AM. Wife found him unresponsive at home at midnight. Bradycardic and unresponsive never lost pulse. Intubated, given atropine, started on Epinephrine, BP 23F systolic highest Hr in 57D.  Clinical Impression  Pt admitted with above diagnosis. Pt currently with functional limitations due to the deficits listed below (see PT Problem List). Pt mobilized to EOB and sit to stand with mod A. Weakness from prolonged time in bed but good motivation and good potential for progression.  Pt will benefit from skilled PT to increase their independence and safety with mobility to allow discharge to the venue listed below.       Follow Up Recommendations Home health PT;Supervision for mobility/OOB    Equipment Recommendations  Rolling walker with 5" wheels    Recommendations for Other Services       Precautions / Restrictions Precautions Precautions: Fall Restrictions Weight Bearing Restrictions: No      Mobility  Bed Mobility Overal bed mobility: Needs Assistance Bed Mobility: Rolling;Sidelying to Sit;Sit to Supine Rolling: Min assist Sidelying to sit: Mod assist   Sit to supine: Max assist   General bed mobility comments: pt able to roll with use of rail with min A, mod A for legs off bed and elevation of trunk. [place] A for legs back into bed to return to supine but able to help push self up in bed  Transfers Overall transfer level: Needs assistance Equipment used: Rolling walker (2 wheeled) Transfers: Sit to/from Stand Sit to Stand: Mod assist         General transfer comment: mod A for power up  Ambulation/Gait             General Gait Details:  NT  Stairs            Wheelchair Mobility    Modified Rankin (Stroke Patients Only)       Balance Overall balance assessment: Needs assistance Sitting-balance support: No upper extremity supported Sitting balance-Leahy Scale: Fair     Standing balance support: Bilateral upper extremity supported Standing balance-Leahy Scale: Poor Standing balance comment: heavy reliance on UE support                             Pertinent Vitals/Pain Pain Assessment: No/denies pain    Home Living Family/patient expects to be discharged to:: Private residence Living Arrangements: Spouse/significant other Available Help at Discharge: Family;Available 24 hours/day Type of Home: House Home Access: Stairs to enter Entrance Stairs-Rails: None Entrance Stairs-Number of Steps: 1 Home Layout: One level Home Equipment: Cane - single point Additional Comments: pt reports that he has a cane but used it occasionally to help pull his pants up but did not walk with it    Prior Function Level of Independence: Independent         Comments: pt retired from trucking co, did loading and unloading     Journalist, newspaper        Extremity/Trunk Assessment   Upper Extremity Assessment Upper Extremity Assessment: Generalized weakness    Lower Extremity Assessment Lower Extremity Assessment: Generalized weakness    Cervical / Trunk Assessment Cervical / Trunk Assessment: Normal  Communication   Communication:  HOH (low voice quality)  Cognition Arousal/Alertness: Lethargic Behavior During Therapy: WFL for tasks assessed/performed Overall Cognitive Status: Within Functional Limits for tasks assessed                                 General Comments: able to relay accurate history      General Comments General comments (skin integrity, edema, etc.): O2 sats 95% on 4L O2, HR 71 bpm, BP 113/66    Exercises     Assessment/Plan    PT Assessment Patient needs  continued PT services  PT Problem List Decreased strength;Decreased activity tolerance;Decreased balance;Decreased mobility;Decreased knowledge of use of DME;Decreased knowledge of precautions;Cardiopulmonary status limiting activity       PT Treatment Interventions DME instruction;Gait training;Stair training;Functional mobility training;Therapeutic activities;Therapeutic exercise;Balance training;Patient/family education    PT Goals (Current goals can be found in the Care Plan section)  Acute Rehab PT Goals Patient Stated Goal: return home PT Goal Formulation: With patient Time For Goal Achievement: 11/20/16 Potential to Achieve Goals: Good    Frequency Min 3X/week   Barriers to discharge        Co-evaluation               AM-PAC PT "6 Clicks" Daily Activity  Outcome Measure Difficulty turning over in bed (including adjusting bedclothes, sheets and blankets)?: Unable Difficulty moving from lying on back to sitting on the side of the bed? : Unable Difficulty sitting down on and standing up from a chair with arms (e.g., wheelchair, bedside commode, etc,.)?: Unable Help needed moving to and from a bed to chair (including a wheelchair)?: A Lot Help needed walking in hospital room?: A Lot Help needed climbing 3-5 steps with a railing? : A Lot 6 Click Score: 9    End of Session Equipment Utilized During Treatment: Oxygen Activity Tolerance: Patient tolerated treatment well Patient left: in bed;with call bell/phone within reach;with bed alarm set Nurse Communication: Mobility status PT Visit Diagnosis: Unsteadiness on feet (R26.81);Muscle weakness (generalized) (M62.81)    Time: 5170-0174 PT Time Calculation (min) (ACUTE ONLY): 31 min   Charges:   PT Evaluation $PT Eval Moderate Complexity: 1 Mod PT Treatments $Therapeutic Activity: 8-22 mins   PT G Codes:        Leighton Roach, PT  Acute Rehab Services  [place] 11/06/2016, 4:03 PM

## 2016-11-06 NOTE — Progress Notes (Deleted)
CBG 55.  Half amp of D50 administered per hypoglycemia protocol.  eLink paged and order received to begin D10 infusion at 30 ml/hr.  15 min CBG recheck: 117.   Will continue to monitor.

## 2016-11-07 LAB — GLUCOSE, CAPILLARY
GLUCOSE-CAPILLARY: 119 mg/dL — AB (ref 65–99)
GLUCOSE-CAPILLARY: 64 mg/dL — AB (ref 65–99)
GLUCOSE-CAPILLARY: 72 mg/dL (ref 65–99)
GLUCOSE-CAPILLARY: 82 mg/dL (ref 65–99)
GLUCOSE-CAPILLARY: 83 mg/dL (ref 65–99)
GLUCOSE-CAPILLARY: 86 mg/dL (ref 65–99)
GLUCOSE-CAPILLARY: 92 mg/dL (ref 65–99)
Glucose-Capillary: 123 mg/dL — ABNORMAL HIGH (ref 65–99)
Glucose-Capillary: 62 mg/dL — ABNORMAL LOW (ref 65–99)
Glucose-Capillary: 62 mg/dL — ABNORMAL LOW (ref 65–99)
Glucose-Capillary: 85 mg/dL (ref 65–99)

## 2016-11-07 LAB — CBC
HCT: 35.4 % — ABNORMAL LOW (ref 39.0–52.0)
Hemoglobin: 11.5 g/dL — ABNORMAL LOW (ref 13.0–17.0)
MCH: 31.7 pg (ref 26.0–34.0)
MCHC: 32.5 g/dL (ref 30.0–36.0)
MCV: 97.5 fL (ref 78.0–100.0)
PLATELETS: 284 10*3/uL (ref 150–400)
RBC: 3.63 MIL/uL — AB (ref 4.22–5.81)
RDW: 15.9 % — AB (ref 11.5–15.5)
WBC: 10.8 10*3/uL — AB (ref 4.0–10.5)

## 2016-11-07 LAB — RENAL FUNCTION PANEL
ALBUMIN: 2.7 g/dL — AB (ref 3.5–5.0)
ANION GAP: 13 (ref 5–15)
BUN: 68 mg/dL — AB (ref 6–20)
CHLORIDE: 106 mmol/L (ref 101–111)
CO2: 25 mmol/L (ref 22–32)
Calcium: 9 mg/dL (ref 8.9–10.3)
Creatinine, Ser: 4.12 mg/dL — ABNORMAL HIGH (ref 0.61–1.24)
GFR, EST AFRICAN AMERICAN: 15 mL/min — AB (ref >60)
GFR, EST NON AFRICAN AMERICAN: 13 mL/min — AB (ref >60)
Glucose, Bld: 90 mg/dL (ref 65–99)
PHOSPHORUS: 7.3 mg/dL — AB (ref 2.5–4.6)
POTASSIUM: 4 mmol/L (ref 3.5–5.1)
Sodium: 144 mmol/L (ref 135–145)

## 2016-11-07 LAB — MAGNESIUM: MAGNESIUM: 2.4 mg/dL (ref 1.7–2.4)

## 2016-11-07 LAB — PROTIME-INR
INR: 1.24
PROTHROMBIN TIME: 15.5 s — AB (ref 11.4–15.2)

## 2016-11-07 MED ORDER — DEXTROSE 50 % IV SOLN
INTRAVENOUS | Status: AC
  Administered 2016-11-07: 50 mL
  Filled 2016-11-07: qty 50

## 2016-11-07 MED ORDER — INFLUENZA VAC SPLIT HIGH-DOSE 0.5 ML IM SUSY
0.5000 mL | PREFILLED_SYRINGE | INTRAMUSCULAR | Status: AC
  Administered 2016-11-08: 0.5 mL via INTRAMUSCULAR
  Filled 2016-11-07: qty 0.5

## 2016-11-07 MED ORDER — WARFARIN SODIUM 2.5 MG PO TABS
2.5000 mg | ORAL_TABLET | Freq: Once | ORAL | Status: AC
  Administered 2016-11-07: 2.5 mg via ORAL
  Filled 2016-11-07: qty 1

## 2016-11-07 NOTE — Procedures (Signed)
Objective Swallowing Evaluation: Type of Study: FEES-Fiberoptic Endoscopic Evaluation of Swallow  Patient Details  Name: Omar Lambert MRN: [id number] Date of Birth: 1940-03-30  Today's Date: 11/07/2016 Time: SLP Start Time (ACUTE ONLY): 1126-SLP Stop Time (ACUTE ONLY): 1146 SLP Time Calculation (min) (ACUTE ONLY): 20 min  Past Medical History:  Past Medical History:  Diagnosis Date  . CHF (congestive heart failure) ([place])   . Diabetes mellitus without complication ([place])   . Thyroid disease    Past Surgical History: History reviewed. No pertinent surgical history. HPI: Pt is a 76 yo male with PMHX CHF, CKD STAGE III/IV, Afib RVR, Aflutter, DM presents after opthalmic surgery on 9/21 AM. Wife found him unresponsive at home that night around midnight. Pt was bradycardic and unresponsive but never lost pulse. He was intubated 9/23-10/2.  Subjective: pt alert, eager for POs   Assessment / Plan / Recommendation  CHL IP CLINICAL IMPRESSIONS 11/07/2016  Clinical Impression Pt alert with normal vocal quality and intensity s/p a 10 day intubation. Laryngeal edema was min-mild and limited to arytenoid cartilages. Decreased pharyngeal contraction during speech tasks. Despite absent dentures mastication with solid cracker was functional with swift transit. Swallow initiation was triggered appropriately and timely without evidence of aspiration upon observation of larynx post swallow and during volitional cough. Mild vallecular residue post cracker clearing with verbal cued second swallow. Safe during consecutive swallows of thin via straw. Recommend regular texture (wife will bring dentures), thin liquids, pills with thin, straws allowed, sit upright and continued ST for safety with recommendations.      SLP Visit Diagnosis Dysphagia, pharyngeal phase (R13.13)  Attention and concentration deficit following --  Frontal lobe and executive function deficit following --  Impact on safety and function Mild  aspiration risk      CHL IP TREATMENT RECOMMENDATION 11/07/2016  Treatment Recommendations Therapy as outlined in treatment plan below     Prognosis 11/07/2016  Prognosis for Safe Diet Advancement Good  Barriers to Reach Goals --  Barriers/Prognosis Comment --    CHL IP DIET RECOMMENDATION 11/07/2016  SLP Diet Recommendations Regular solids;Thin liquid  Liquid Administration via Cup;Straw  Medication Administration Whole meds with liquid  Compensations Slow rate;Small sips/bites  Postural Changes Seated upright at 90 degrees      CHL IP OTHER RECOMMENDATIONS 11/07/2016  Recommended Consults --  Oral Care Recommendations Oral care BID  Other Recommendations --      CHL IP FOLLOW UP RECOMMENDATIONS 11/07/2016  Follow up Recommendations None      CHL IP FREQUENCY AND DURATION 11/07/2016  Speech Therapy Frequency (ACUTE ONLY) min 2x/week  Treatment Duration 2 weeks           CHL IP ORAL PHASE 11/07/2016  Oral Phase WFL  Oral - Pudding Teaspoon --  Oral - Pudding Cup --  Oral - Honey Teaspoon --  Oral - Honey Cup --  Oral - Nectar Teaspoon --  Oral - Nectar Cup --  Oral - Nectar Straw --  Oral - Thin Teaspoon --  Oral - Thin Cup --  Oral - Thin Straw --  Oral - Puree --  Oral - Mech Soft --  Oral - Regular --  Oral - Multi-Consistency --  Oral - Pill --  Oral Phase - Comment --    CHL IP PHARYNGEAL PHASE 11/07/2016  Pharyngeal Phase Impaired  Pharyngeal- Pudding Teaspoon --  Pharyngeal --  Pharyngeal- Pudding Cup --  Pharyngeal --  Pharyngeal- Honey Teaspoon --  Pharyngeal --  Pharyngeal-  Honey Cup --  Pharyngeal --  Pharyngeal- Nectar Teaspoon --  Pharyngeal --  Pharyngeal- Nectar Cup --  Pharyngeal --  Pharyngeal- Nectar Straw --  Pharyngeal --  Pharyngeal- Thin Teaspoon --  Pharyngeal --  Pharyngeal- Thin Cup WFL  Pharyngeal --  Pharyngeal- Thin Straw WFL  Pharyngeal --  Pharyngeal- Puree --  Pharyngeal --  Pharyngeal- Mechanical Soft --   Pharyngeal --  Pharyngeal- Regular Pharyngeal residue - valleculae  Pharyngeal --  Pharyngeal- Multi-consistency --  Pharyngeal --  Pharyngeal- Pill --  Pharyngeal --  Pharyngeal Comment --     CHL IP CERVICAL ESOPHAGEAL PHASE 11/07/2016  Cervical Esophageal Phase WFL  Pudding Teaspoon --  Pudding Cup --  Honey Teaspoon --  Honey Cup --  Nectar Teaspoon --  Nectar Cup --  Nectar Straw --  Thin Teaspoon --  Thin Cup --  Thin Straw --  Puree --  Mechanical Soft --  Regular --  Multi-consistency --  Pill --  Cervical Esophageal Comment --    No flowsheet data found.  Houston Siren 11/07/2016, 12:17 PM  Orbie Pyo Colvin Caroli[employer] [phone]

## 2016-11-07 NOTE — Progress Notes (Signed)
  Speech Language Pathology  Patient Details Name: Omar Lambert MRN: [id number] DOB: 12/08/1940 Today's Date: 11/07/2016 Time:  -     FEES scheduled today at 11:30           Omar Lambert 11/07/2016, 9:28 AM  Omar Lambert.Ed [employer] [phone]

## 2016-11-07 NOTE — Progress Notes (Signed)
[facility] Rounding Note  Subjective:    HD 10/1 1 liter off Fem cath removed after that HD  Continues to make about 1500 of urine per 24 hours Extubated yesterday  Awake and alert - wants something to ead!!! Denies any pain or SOB  Started some D10 during the night 2/2 hypoglycemia  Objective Vital signs in last 24 hours: Vitals:   11/07/16 0300 11/07/16 0400 11/07/16 0500 11/07/16 0600  BP: 120/68 100/88 109/62 116/80  Pulse: 70 73 71 68  Resp: (!) 22 (!) 31 (!) 26 (!) 25  Temp: 98.6 F (37 C) 98.6 F (37 C) 98.2 F (36.8 C) 98.4 F (36.9 C)  TempSrc:      SpO2: 100% 100% 95% 99%  Weight:   88.2 kg (194 lb 7.1 oz)   Height:       Weight change: -3.9 kg (-8 lb 9.6 oz)  Intake/Output Summary (Last 24 hours) at 11/07/16 0907 Last data filed at 11/07/16 0800  Gross per 24 hour  Intake            419.5 ml  Output             1730 ml  Net          -1310.5 ml   Physical Exam: Family in with patient Awake, follows commands Wife in with him Extubated and on nasal cannula Foley/R IJ 3L S1S2 No S3 Distant heart sounds On vent, ant fairly clear Abdomen: obese, soft, non tender Extremities no edema whatsoever  Dialysis Access: femoral vascath has been removed    Recent Labs Lab 11/05/16 0356 11/06/16 0319 11/07/16 0313  NA 147* 142 144  K 3.7 3.7 4.0  CL 109 106 106  CO2 25 27 25   GLUCOSE 101* 99 90  BUN 104* 55* 68*  CREATININE 5.36* 3.59* 4.12*  CALCIUM 8.9 8.7* 9.0  PHOS 6.9* 5.0* 7.3*     Recent Labs Lab 11/05/16 0356 11/06/16 0319 11/07/16 0313  AST  --  72*  --   ALT  --  183*  --   ALKPHOS  --  185*  --   BILITOT  --  2.4*  --   PROT  --  6.9  --   ALBUMIN 2.6* 2.6*  2.6* 2.7*    Recent Labs Lab 11/01/16 0341 11/02/16 0251 11/03/16 0512 11/04/16 0415 11/05/16 0356 11/06/16 0319 11/07/16 0313  WBC 5.4 5.9 7.6 8.7 10.1 11.1* 10.8*  NEUTROABS 3.9 4.5 5.6  --   --   --   --   HGB 11.7* 11.6* 11.5* 10.9* 10.6* 10.5* 11.5*  HCT 35.0*  35.6* 35.1* 33.5* 32.9* 33.3* 35.4*  MCV 92.3 94.4 93.9 94.9 95.9 96.5 97.5  PLT 152 144* 171 210 246 256 284    Recent Labs Lab 11/06/16 1927 11/06/16 2012 11/06/16 2355 11/07/16 0337 11/07/16 0809  GLUCAP 55* 117* 72 82 64*   Studies/Results: Dg Chest Portable 1 View  Result Date: 11/06/2016 CLINICAL DATA:  76 year old male with respiratory failure. Subsequent encounter. EXAM: PORTABLE CHEST 1 VIEW COMPARISON:  11/05/2016. FINDINGS: Rotation to the left. Endotracheal tube tip 4.2 cm above the carina. Nasogastric tube courses below the diaphragm. Tip is not included on the present exam. Right central line tip proximal superior vena cava level. Heart may be top-normal to slightly enlarged. Left base opacification suggestive of atelectasis with small pleural effusion. Pulmonary vascular congestion/mild congestive heart failure. No gross pneumothorax. Calcified slightly tortuous aorta. IMPRESSION: Rotated exam. Left base atelectasis/small pleural effusion.  Pulmonary vascular congestion/mild pulmonary edema similar to prior exam. Aortic Atherosclerosis (ICD10-I70.0). Electronically Signed   By: Genia Del M.D.   On: 11/06/2016 07:52   Medications: Infusions: . sodium chloride Stopped (10/27/16 1515)  . sodium chloride    . sodium chloride Stopped (11/06/16 2001)  . dextrose 30 mL/hr at 11/06/16 2001    Scheduled Medications: . Chlorhexidine Gluconate Cloth  6 each Topical Daily  . insulin aspart  0-15 Units Subcutaneous Q4H  . levothyroxine  175 mcg Oral QAC breakfast  . mouth rinse  15 mL Mouth Rinse BID  . pantoprazole sodium  40 mg Per Tube Q1200  . patient's guide to using coumadin book   Does not apply Once  . sodium chloride flush  10-40 mL Intracatheter Q12H  . warfarin  2.5 mg Oral ONCE-1800  . Warfarin - Pharmacist Dosing Inpatient   Does not apply q1800    Assessment/ Plan: 76 y.o. yo male PMH DM, CHF, T4 ds, gout, HLD, AF, baseline CKD Stage 4 (followed at  [facility]), AVF plans and CKD planning underway week prior to admission).  Admitted 10/27/2016 poorly responsive after eye surgery w/profound bradycardia. AKI on CKD. CRRT 9/25-9/28.   1. Non oliguric A on CKD4 - pre HD planning had started as outpt prior to this event. At time of initial consult was uremic and in setting of critical illness was felt best to initiate RRT in order for him to overall improve.CRRT 9/25-9/28. Labs and volume status improved, CRRT stopped 9/28. HD 10/1 2/2 BUN close to 100, no plateau in creatinine. Since then still with good UOP. No indication for HD today. (If more HD needed will have to pursue [facility]). Trending labs. Looks overall much better 2. Anemia- hgb still >10. No intervention yet needed.  3. Acute bradycardic event post eye surgery - nuclear EF 47%, low risk for ischemia. Trops neg. No other inpt w/u planned. For 30 day monitor after discharge. Cards has signed off. 4. HCAP zosyn completed 5. VDRF - extubated and doing well on nasal cannula 6. DM per primary team 7. Hypothyroidism getting levothyroxine 8. HLD  From renal perspective just monitoring for further HD needs here in the hospital  Jamal Maes, MD [place] Pager 11/07/2016, 9:07 AM

## 2016-11-07 NOTE — Progress Notes (Signed)
Name: Omar Lambert MRN: [id number] DOB: 11-30-1940    ADMISSION DATE:  10/27/2016 CONSULTATION DATE: 10/27/16  REFERRING MD : ER ATTENDING DELO  CHIEF COMPLAINT:  BRADYCARDIA, UNRESPONSIVE  BRIEF PATIENT DESCRIPTION: 76 YR OLD MALE W/ PMHX CHF, CKD STAGE III/IV ( seen by Nephro  and discussing possible dialysis), Afib RVR, A flutter, DM presents after opthalmic surgery on 9/21 AM. Post surgery wife found him unresponsive at home at midnight. Bradycardic and unresponsive never lost pulse. Intubated, given atropine, started on Epinephrine, BP 81K systolic highest Hr in 48J. PCCM consulted  STUDIES:  CT HEAD 9/22>>> Negative  EKG - atrial fibrillation with slow ventricular response with RBBB low QRS voltage ECHO  08/18/2015>> nml LVSF ,RVSP 40 Stress test nuclear lexiscan 10/23/2016 > Nuclear stress EF: 47%.  SIGNIFICANT EVENTS  ETT 9/23>> RIGHT IJ CVC 9/23>> A-LINE 9/23 >>9/28 9/24 -  paralyzed, sedated.  Remains on epi gtt r/t bradycardia, levophed 58mcg.  9/24 CRRT>9/28  Off sedation since 9/25 - starting to wake slowly.      SUBJECTIVE/OVERNIGHT/INTERVAL HX Intermittent agitation, no other events overnight  VITAL SIGNS: Temp:  [98.2 F (36.8 C)-98.6 F (37 C)] 98.6 F (37 C) (10/03 1000) Pulse Rate:  [68-76] 76 (10/03 1100) Resp:  [18-31] 22 (10/03 1100) BP: (100-141)/(60-88) 131/72 (10/03 1100) SpO2:  [92 %-100 %] 94 % (10/03 1100) Weight:  [88.2 kg (194 lb 7.1 oz)] 88.2 kg (194 lb 7.1 oz) (10/03 0500)   General:  Chronically ill appearing male, anxious, trying to reach for ETT HEENT: ETT, MM pink/moist Neuro: awake, alert, follows commands CV: s1s2 rrr, no m/r/g PULM: resps even/non-labored on PS 8/5, CTAB EH:UDJS, non-tender, bsx4 active  Extremities: warm/dry, scant BLE edema  Skin: no rashes or lesions   PULMONARY No results for input(s): PHART, PCO2ART, PO2ART, HCO3, TCO2, O2SAT in the last 168 hours.  Invalid input(s): PCO2, PO2  CBC  Recent  Labs Lab 11/05/16 0356 11/06/16 0319 11/07/16 0313  HGB 10.6* 10.5* 11.5*  HCT 32.9* 33.3* 35.4*  WBC 10.1 11.1* 10.8*  PLT 246 256 284    COAGULATION  Recent Labs Lab 11/05/16 1545 11/06/16 0319 11/07/16 0313  INR 1.15 1.24 1.24    CARDIAC   No results for input(s): TROPONINI in the last 168 hours. No results for input(s): PROBNP in the last 168 hours.   CHEMISTRY  Recent Labs Lab 11/03/16 0512 11/04/16 0415 11/05/16 0356 11/06/16 0319 11/07/16 0313  NA 139 142 147* 142 144  K 3.7 3.6 3.7 3.7 4.0  CL 102 107 109 106 106  CO2 25 24 25 27 25   GLUCOSE 103* 92 101* 99 90  BUN 65* 87* 104* 55* 68*  CREATININE 3.82* 4.75* 5.36* 3.59* 4.12*  CALCIUM 9.0 9.0 8.9 8.7* 9.0  MG 2.5* 2.6* 2.7* 2.2 2.4  PHOS 4.1 5.5* 6.9* 5.0* 7.3*   Estimated Creatinine Clearance: 16.2 mL/min (A) (by C-G formula based on SCr of 4.12 mg/dL (H)).   LIVER  Recent Labs Lab 11/03/16 0512 11/04/16 0415 11/05/16 0356 11/05/16 1545 11/06/16 0319 11/07/16 0313  AST  --   --   --   --  72*  --   ALT  --   --   --   --  183*  --   ALKPHOS  --   --   --   --  185*  --   BILITOT  --   --   --   --  2.4*  --   PROT  --   --   --   --  6.9  --   ALBUMIN 2.7* 2.5* 2.6*  --  2.6*  2.6* 2.7*  INR  --   --   --  1.15 1.24 1.24     INFECTIOUS No results for input(s): LATICACIDVEN, PROCALCITON in the last 168 hours.   ENDOCRINE CBG (last 3)   Recent Labs  11/07/16 0337 11/07/16 0809 11/07/16 0948  GLUCAP 82 64* 123*       IMAGING x48h  - image(s) personally visualized  -   highlighted in bold Dg Chest Portable 1 View  Result Date: 11/06/2016 CLINICAL DATA:  76 year old male with respiratory failure. Subsequent encounter. EXAM: PORTABLE CHEST 1 VIEW COMPARISON:  11/05/2016. FINDINGS: Rotation to the left. Endotracheal tube tip 4.2 cm above the carina. Nasogastric tube courses below the diaphragm. Tip is not included on the present exam. Right central line tip proximal  superior vena cava level. Heart may be top-normal to slightly enlarged. Left base opacification suggestive of atelectasis with small pleural effusion. Pulmonary vascular congestion/mild congestive heart failure. No gross pneumothorax. Calcified slightly tortuous aorta. IMPRESSION: Rotated exam. Left base atelectasis/small pleural effusion. Pulmonary vascular congestion/mild pulmonary edema similar to prior exam. Aortic Atherosclerosis (ICD10-I70.0). Electronically Signed   By: Genia Del M.D.   On: 11/06/2016 07:52   I reviewed CXR myself, no acute disease noted  ASSESSMENT / PLAN:  PULMONARY A: Acute resp failure r/t volume overload +/- ALI S/p nimbex x 48h  Left basilar atelectasis with small effusion Hx OSA  P:   DNI Titrate O2 for sat of 88-92% Monitor for airway protection Ambulate  CARDIOVASCULAR A:  H/o AFib on apixaban and amio prior to admit .  admitted with bradycardia following timolol eye drop - initially required epi gtt Diastolic CHF  Cardiogenic Shock -resolved.  P:  MAP goal > 85 Amio on hold Cards recommends 30 day monitor on d/c  ? when to restart amiodarone - defer to cards  RENAL Acute on CKD IV - improving s/p CRRT P:   Renal following If needs further HD will need tunneled cath, defer to renal F/u chem  Replace electrolytes as indicated  GASTROINTESTINAL A:   NPO P:   TF, hold for anticipated extubation later today PPI  HEMATOLOGIC A:   Mild anemia of critical illness P:  F/u CBC  SCD's Warfarin started 10/1 (instead of preadmission apixaban given potential for iHD).  INFECTIOUS A:   MSSA PCR + and PCT c/w Septic shock - likely aspiration pneumonia - s/p zosyn x 10 days P: Monitor clinically  ENDOCRINE A:   DM-2 Hypothyroid P:   SSI Continue home synthroid - TSH 2.561  NEUROLOGIC A:   Acute encephalopathy noted post admission - improving slowly.  CT head neg P:   RASS goal: 0 D/C all sedation  FAMILY  - Updates:  Patient updated bedside  - Inter-disciplinary family meet or Palliative Care meeting due by:  DAy 7  Transfer to med-surg and to [facility] service with PCCM off 10/4.  Discussed with TRH MD  Rush Farmer, M.D. [facility] Pulmonary/Critical Care Medicine. Pager: [phone]. After hours pager: [phone].

## 2016-11-07 NOTE — Progress Notes (Signed)
ANTICOAGULATION CONSULT NOTE - Follow Up Consult  Pharmacy Consult for Coumadin Indication: atrial fibrillation  No Known Allergies  Patient Measurements: Height: 5\' 8"  (172.7 cm) Weight: 194 lb 7.1 oz (88.2 kg) IBW/kg (Calculated) : 68.4  Vital Signs: Temp: 98.6 F (37 C) (10/03 1000) BP: 131/72 (10/03 1100) Pulse Rate: 76 (10/03 1100)  Labs:  Recent Labs  11/05/16 0356 11/05/16 1545 11/06/16 0319 11/07/16 0313  HGB 10.6*  --  10.5* 11.5*  HCT 32.9*  --  33.3* 35.4*  PLT 246  --  256 284  LABPROT  --  14.6 15.5* 15.5*  INR  --  1.15 1.24 1.24  CREATININE 5.36*  --  3.59* 4.12*    Estimated Creatinine Clearance: 16.2 mL/min (A) (by C-G formula based on SCr of 4.12 mg/dL (H)).  Assessment: 76yom on apixaban pta for afib, started on coumadin 10/1 since now on dialysis. Coumadin score is 8 but lower dose started given elevated LFTs. Dose not given 10/2 due to patient being NPO. He passed his swallow eval today. INR 1.24. CBC stable.   Goal of Therapy:  INR 2-3 Monitor platelets by anticoagulation protocol: Yes   Plan:  1) Coumadin 2.5mg  tonight 2) Daily INR  Deboraha Sprang 11/07/2016,12:51 PM

## 2016-11-07 NOTE — Progress Notes (Signed)
Physical Therapy Treatment Patient Details Name: Omar Lambert MRN: [id number] DOB: Jul 03, 1940 Today's Date: 11/07/2016    History of Present Illness  76 yo male with PMHX CHF, CKD STAGE III/IV ( seen by Nephro this week and discussing possible dialysis), Afib RVR, Aflutter, DM presents after opthalmic surgery on 9/21 AM. Wife found him unresponsive at home at midnight. Bradycardic and unresponsive never lost pulse. Intubated, given atropine, started on Epinephrine, BP 74J systolic highest Hr in 28N.    PT Comments    Pt admitted with above diagnosis. Pt currently with functional limitations due to balance and endurance deficits. Pt was able to ambualte on unit with Clarise Cruz plus with mod assist of 2 persons.  Overall progressing.  Will continue to follow pt. Pt will benefit from skilled PT to increase their independence and safety with mobility to allow discharge to the venue listed below.     Follow Up Recommendations  Home health PT;Supervision for mobility/OOB     Equipment Recommendations  Rolling walker with 5" wheels    Recommendations for Other Services       Precautions / Restrictions Precautions Precautions: Fall Restrictions Weight Bearing Restrictions: No    Mobility  Bed Mobility Overal bed mobility: Needs Assistance Bed Mobility: Rolling;Sidelying to Sit;Sit to Supine Rolling: Min guard Sidelying to sit: Min assist       General bed mobility comments: pt able to roll with use of rail with min guard A, incr time to come to EOB. A little use of pad to come to EOB.   Transfers Overall transfer level: Needs assistance Equipment used: Ambulation equipment used Transfers: Sit to/from Stand Sit to Stand: +2 safety/equipment;From elevated surface;Min assist;Mod assist         General transfer comment: Clarise Cruz lift used to stand pt. Upon standing noted some BM on pad therefore cleaned pt.  LEft knee plate on but removed foot plate.  Ambulation/Gait Ambulation/Gait  assistance: Min assist;Mod assist;+2 safety/equipment Ambulation Distance (Feet): 190 Feet Assistive device:  (Sara plus) Gait Pattern/deviations: Step-through pattern;Decreased stride length;Drifts right/left;Trunk flexed   Gait velocity interpretation: Below normal speed for age/gender General Gait Details: Pt was able to push Clarise Cruz plus with occasional assist for straight path and to control the [place] and cues.  Pt followed with chair but did not need to sit down.  Pt needed cues to stand tall as he leaned at times posturally.  Overall pt did very well.     Stairs            Wheelchair Mobility    Modified Rankin (Stroke Patients Only)       Balance Overall balance assessment: Needs assistance Sitting-balance support: Bilateral upper extremity supported;Feet supported Sitting balance-Leahy Scale: Poor Sitting balance - Comments: relied on UE support and had left lateral lean.     Standing balance support: Bilateral upper extremity supported Standing balance-Leahy Scale: Poor Standing balance comment: heavy reliance on UE support                            Cognition Arousal/Alertness: Awake/alert Behavior During Therapy: WFL for tasks assessed/performed Overall Cognitive Status: Within Functional Limits for tasks assessed                                        Exercises      General Comments General comments (skin integrity,  edema, etc.): O2 sats maintained at 94% and > on 2LO2 with ambulaation.  Other VSS.        Pertinent Vitals/Pain Pain Assessment: No/denies pain    Home Living                      Prior Function            PT Goals (current goals can now be found in the care plan section) Progress towards PT goals: Progressing toward goals    Frequency    Min 3X/week      PT Plan Current plan remains appropriate    Co-evaluation              AM-PAC PT "6 Clicks" Daily Activity  Outcome Measure   Difficulty turning over in bed (including adjusting bedclothes, sheets and blankets)?: A Little Difficulty moving from lying on back to sitting on the side of the bed? : A Little Difficulty sitting down on and standing up from a chair with arms (e.g., wheelchair, bedside commode, etc,.)?: A Lot Help needed moving to and from a bed to chair (including a wheelchair)?: A Lot Help needed walking in hospital room?: A Lot Help needed climbing 3-5 steps with a railing? : A Lot 6 Click Score: 14    End of Session Equipment Utilized During Treatment: Oxygen;Gait belt Activity Tolerance: Patient tolerated treatment well Patient left: with call bell/phone within reach;in chair;with family/visitor present Nurse Communication: Mobility status PT Visit Diagnosis: Unsteadiness on feet (R26.81);Muscle weakness (generalized) (M62.81)     Time: 1027-2536 PT Time Calculation (min) (ACUTE ONLY): 42 min  Charges:  $Gait Training: 23-37 mins $Self Care/Home Management: 8-22                    G Codes:       Arvind Mexicano,PT Acute Rehabilitation [phone] (pager)    [place] 11/07/2016, 1:15 PM

## 2016-11-08 ENCOUNTER — Encounter (HOSPITAL_COMMUNITY): Payer: Self-pay | Admitting: General Practice

## 2016-11-08 LAB — CBC
HEMATOCRIT: 35.3 % — AB (ref 39.0–52.0)
Hemoglobin: 11.3 g/dL — ABNORMAL LOW (ref 13.0–17.0)
MCH: 31 pg (ref 26.0–34.0)
MCHC: 32 g/dL (ref 30.0–36.0)
MCV: 96.7 fL (ref 78.0–100.0)
Platelets: 359 10*3/uL (ref 150–400)
RBC: 3.65 MIL/uL — ABNORMAL LOW (ref 4.22–5.81)
RDW: 15.6 % — AB (ref 11.5–15.5)
WBC: 9.5 10*3/uL (ref 4.0–10.5)

## 2016-11-08 LAB — GLUCOSE, CAPILLARY
Glucose-Capillary: 95 mg/dL (ref 65–99)
Glucose-Capillary: 97 mg/dL (ref 65–99)
Glucose-Capillary: 98 mg/dL (ref 65–99)
Glucose-Capillary: 118 mg/dL — ABNORMAL HIGH (ref 65–99)
Glucose-Capillary: 92 mg/dL (ref 65–99)

## 2016-11-08 LAB — BASIC METABOLIC PANEL
Anion gap: 15 (ref 5–15)
BUN: 63 mg/dL — ABNORMAL HIGH (ref 6–20)
CALCIUM: 8.6 mg/dL — AB (ref 8.9–10.3)
CO2: 26 mmol/L (ref 22–32)
CREATININE: 4.08 mg/dL — AB (ref 0.61–1.24)
Chloride: 100 mmol/L — ABNORMAL LOW (ref 101–111)
GFR calc non Af Amer: 13 mL/min — ABNORMAL LOW (ref >60)
GFR, EST AFRICAN AMERICAN: 15 mL/min — AB (ref >60)
GLUCOSE: 88 mg/dL (ref 65–99)
Potassium: 3.6 mmol/L (ref 3.5–5.1)
Sodium: 141 mmol/L (ref 135–145)

## 2016-11-08 LAB — PROTIME-INR
INR: 1.26
Prothrombin Time: 15.7 seconds — ABNORMAL HIGH (ref 11.4–15.2)

## 2016-11-08 LAB — PHOSPHORUS: Phosphorus: 5 mg/dL — ABNORMAL HIGH (ref 2.5–4.6)

## 2016-11-08 MED ORDER — WARFARIN SODIUM 4 MG PO TABS
4.0000 mg | ORAL_TABLET | Freq: Once | ORAL | Status: AC
  Administered 2016-11-08: 4 mg via ORAL
  Filled 2016-11-08: qty 1

## 2016-11-08 MED ORDER — ALLOPURINOL 300 MG PO TABS
300.0000 mg | ORAL_TABLET | Freq: Every day | ORAL | Status: DC
  Administered 2016-11-08 – 2016-11-09 (×2): 300 mg via ORAL
  Filled 2016-11-08: qty 3
  Filled 2016-11-08: qty 1

## 2016-11-08 MED ORDER — SODIUM CHLORIDE 0.9 % IV SOLN
INTRAVENOUS | Status: DC
  Administered 2016-11-08: 17:00:00 via INTRAVENOUS

## 2016-11-08 MED ORDER — INSULIN ASPART 100 UNIT/ML ~~LOC~~ SOLN: 0.0000 [IU] | Freq: Three times a day (TID) | SUBCUTANEOUS | Status: DC

## 2016-11-08 MED ORDER — AMIODARONE HCL 200 MG PO TABS: 200.0000 mg | ORAL_TABLET | Freq: Two times a day (BID) | ORAL | Status: DC

## 2016-11-08 MED ORDER — DIPHENOXYLATE-ATROPINE 2.5-0.025 MG PO TABS
2.0000 | ORAL_TABLET | Freq: Once | ORAL | Status: AC
  Administered 2016-11-08: 2 via ORAL
  Filled 2016-11-08: qty 2

## 2016-11-08 MED ORDER — PANTOPRAZOLE SODIUM 40 MG PO TBEC
40.0000 mg | DELAYED_RELEASE_TABLET | Freq: Every day | ORAL | Status: DC
  Administered 2016-11-08 – 2016-11-09 (×2): 40 mg via ORAL
  Filled 2016-11-08 (×2): qty 1

## 2016-11-08 NOTE — Progress Notes (Signed)
  Speech Language Pathology Treatment: Dysphagia  Patient Details Name: Omar Lambert MRN: [id number] DOB: 02/19/1940 Today's Date: 11/08/2016 Time: 1202-1212 SLP Time Calculation (min) (ACUTE ONLY): 10 min  Assessment / Plan / Recommendation Clinical Impression  Pt consumed regular textures and thin liquids with mildly prolonged mastication even with dentures in place. He had immediate throat clearing x1 in the setting of rapid intake with solids. SLP provided Min cues for smaller bites and slower pacing with no further signs of difficulty observed. Recommend to continue current diet. SLP will f/u acutely to ensure tolerance.    HPI HPI: Pt is a 76 yo male with PMHX CHF, CKD STAGE III/IV, Afib RVR, Aflutter, DM presents after opthalmic surgery on 9/21 AM. Wife found him unresponsive at home that night around midnight. Pt was bradycardic and unresponsive but never lost pulse. He was intubated 9/23-10/2.      SLP Plan  Continue with current plan of care       Recommendations  Diet recommendations: Regular;Thin liquid Liquids provided via: Cup;Straw Medication Administration: Whole meds with liquid Supervision: Patient able to self feed;Intermittent supervision to cue for compensatory strategies Compensations: Slow rate;Small sips/bites Postural Changes and/or Swallow Maneuvers: Seated upright 90 degrees                Oral Care Recommendations: Oral care BID Follow up Recommendations: None SLP Visit Diagnosis: Dysphagia, pharyngeal phase (R13.13) Plan: Continue with current plan of care       GO                Omar Lambert 11/08/2016, 1:42 PM  Omar Lambert, M.A. CCC-SLP [phone]

## 2016-11-08 NOTE — Progress Notes (Signed)
[facility] Rounding Note  Subjective:    CRRT 9/25-9/28 Hemodialysis 10/1 Fem cathHD 10/1 - 1 liter off Fem cath removed after that HD  Successfully extubated Transferred to floor 10/3 Continues with spontaneous UOP/no diuretics (was on lasix at home - will clarify home dose in records)  Looks well, no CP, SOB, nausea Wonders when will go home (has been up on side of bed and also walked some yesterday)   Objective Vital signs in last 24 hours: Vitals:   11/07/16 1900 11/07/16 2000 11/07/16 2242 11/08/16 0408  BP: 128/80 118/67 125/64 125/65  Pulse:  73 79 74  Resp: (!) 26 (!) 30 20 20   Temp:  (!) 97.4 F (36.3 C) 99 F (37.2 C) 98.7 F (37.1 C)  TempSrc:  Axillary Axillary Axillary  SpO2: 92% 98% 99% 100%  Weight:   90.7 kg (200 lb)   Height:   5\' 8"  (1.727 m)    Weight change: 2.519 kg (5 lb 8.9 oz)  Intake/Output Summary (Last 24 hours) at 11/08/16 0709 Last data filed at 11/08/16 0600  Gross per 24 hour  Intake          1050.17 ml  Output             1250 ml  Net          -199.83 ml   Physical Exam: Family in with patient Awake, alert, quite pleasant Wife in with him Nasal cannula Foley/R IJ 3L S1S2 No S3 Distant heart sounds Lungs ant clear Abdomen: obese, soft, non tender Extremities no edema whatsoever  Dialysis Access: femoral vascath has been removed    Recent Labs Lab 11/06/16 0319 11/07/16 0313 11/08/16 0423  NA 142 144 141  K 3.7 4.0 3.6  CL 106 106 100*  CO2 27 25 26   GLUCOSE 99 90 88  BUN 55* 68* 63*  CREATININE 3.59* 4.12* 4.08*  CALCIUM 8.7* 9.0 8.6*  PHOS 5.0* 7.3* 5.0*   Lab Results  Component Value Date   INR 1.26 11/08/2016   INR 1.24 11/07/2016   INR 1.24 11/06/2016    Recent Labs Lab 11/05/16 0356 11/06/16 0319 11/07/16 0313  AST  --  72*  --   ALT  --  183*  --   ALKPHOS  --  185*  --   BILITOT  --  2.4*  --   PROT  --  6.9  --   ALBUMIN 2.6* 2.6*  2.6* 2.7*    Recent Labs Lab 11/02/16 0251 11/03/16 0512  11/04/16 0415 11/05/16 0356 11/06/16 0319 11/07/16 0313 11/08/16 0423  WBC 5.9 7.6 8.7 10.1 11.1* 10.8* 9.5  NEUTROABS 4.5 5.6  --   --   --   --   --   HGB 11.6* 11.5* 10.9* 10.6* 10.5* 11.5* 11.3*  HCT 35.6* 35.1* 33.5* 32.9* 33.3* 35.4* 35.3*  MCV 94.4 93.9 94.9 95.9 96.5 97.5 96.7  PLT 144* 171 210 246 256 284 359    Recent Labs Lab 11/07/16 1409 11/07/16 1706 11/07/16 2031 11/08/16 0004 11/08/16 0404  GLUCAP 85 83 86 119* 95   Medications: Infusions: . sodium chloride Stopped (10/27/16 1515)  . sodium chloride    . sodium chloride Stopped (11/06/16 2001)  . dextrose 30 mL/hr at 11/06/16 2001    Scheduled Medications: . Chlorhexidine Gluconate Cloth  6 each Topical Daily  . Influenza vac split quadrivalent PF  0.5 mL Intramuscular Tomorrow-1000  . insulin aspart  0-15 Units Subcutaneous Q4H  . levothyroxine  175 mcg Oral QAC breakfast  . mouth rinse  15 mL Mouth Rinse BID  . pantoprazole sodium  40 mg Per Tube Q1200  . patient's guide to using coumadin book   Does not apply Once  . sodium chloride flush  10-40 mL Intracatheter Q12H  . Warfarin - Pharmacist Dosing Inpatient   Does not apply q1800    Assessment/ Plan: 76 y.o. yo male PMH DM, CHF, T4 ds, gout, HLD, AFib//flutter, baseline CKD Stage 4 (followed at [facility]/Dr. Deterding), AVF plans and CKD planning had been underway week prior to admission).  Admitted 10/27/2016 poorly responsive after eye surgery w/profound bradycardia/hypotension. AKI on CKD. CRRT 9/25-9/28.IHD 10/1. Spontaneous recovery.    1. Non oliguric AKU on CKD4 - hemodynamically driven. Pre HD planning had started as outpt prior to this event. At time of initial consult was uremic. CRRT inititated  in setting of critical illness 9/25-9/28. Wt decreased 10 kg. Hemodialysis 10/1 for azotemia. No further HD required. Temp cath removed. Since that time still good UOP and today creatinine appears to have reached a plateau! Hope to see trend down now. Will  clarify most recent baseline in our office records. Has not required resumption of diuretics yet (was on lasix at home PTA - will clarify dosing). YAY!  2. Anemia- hgb still >10. No intervention yet needed.  3. Acute bradycardic event post eye surgery - nuclear EF 47%, low risk for ischemia. Trops neg. No other inpt w/u planned. For 30 day monitor after discharge. Cards has signed off. 4. HCAP zosyn completed 5. VDRF - Vol xs/ALI/HCAP. resolved. No issues post extubation. 6. DM per primary team 7. Hypothyroidism getting levothyroxine 8. HLD 9. AFF - warfarin per pharmacy  Jamal Maes, MD [facility] [phone] Pager 11/08/2016, 7:09 AM

## 2016-11-08 NOTE — Progress Notes (Signed)
ANTICOAGULATION CONSULT NOTE - Follow Up Consult  Pharmacy Consult for Coumadin Indication: atrial fibrillation  No Known Allergies  Patient Measurements: Height: 5\' 8"  (172.7 cm) Weight: 200 lb (90.7 kg) IBW/kg (Calculated) : 68.4  Vital Signs: Temp: 98.4 F (36.9 C) (10/04 0900) Temp Source: Axillary (10/04 0900) BP: 111/61 (10/04 0900) Pulse Rate: 81 (10/04 0900)  Labs:  Recent Labs  11/06/16 0319 11/07/16 0313 11/08/16 0423  HGB 10.5* 11.5* 11.3*  HCT 33.3* 35.4* 35.3*  PLT 256 284 359  LABPROT 15.5* 15.5* 15.7*  INR 1.24 1.24 1.26  CREATININE 3.59* 4.12* 4.08*    Estimated Creatinine Clearance: 16.6 mL/min (A) (by C-G formula based on SCr of 4.08 mg/dL (H)).  Assessment: 47 YOM on apixaban PTA for Afib however transitioned to warfarin on 10/1 given AoCKD IV requiring IHD this admission. Pharmacy is on board for warfarin dosing. The patient was started on lower doses initially given elevated LFTs - though trending down  INR today remains SUBtherapeutic (INR 1.26 << 1.24, goal of 2-3) however noted missed dose on 10/4.   Goal of Therapy:  INR 2-3 Monitor platelets by anticoagulation protocol: Yes   Plan:  1. Warfarin 4 mg x 1 dose at 1800 today 2. Will continue to monitor for any signs/symptoms of bleeding and will follow up with PT/INR in the a.m.   Thank you for allowing pharmacy to be a part of this patient's care.  Alycia Rossetti, PharmD, BCPS Clinical Pharmacist Pager: [phone] Clinical phone for 11/08/2016 from 7a-3:30p: [phone] If after 3:30p, please call main pharmacy at: [phone] 11/08/2016 2:42 PM

## 2016-11-08 NOTE — Care Management Important Message (Signed)
Important Message  Patient Details  Name: Omar Lambert MRN: [id number] Date of Birth: 09-25-1940   Medicare Important Message Given:  Yes    Satonya Lux Abena 11/08/2016, 10:21 AM

## 2016-11-08 NOTE — Progress Notes (Signed)
PROGRESS NOTE    Omar Lambert   WRU:[id number]  DOB: [birth year]  DOA: 10/27/2016 PCP: System, Pcp Not In   Brief Narrative:  Omar Lambert 76 y/o with CKD 3-4, A-fib/flutter, DM who underwent opthalmic surgery on 9/21 and  presented on 9/22 for unresponsiveness and bradycardia. Intubated, given atropine, started on Epinephrine. ENDOTRACHEAL INTUBATION 9/23>>ETT 9/23 RIGHT IJ CVC 9/23>> A-LINE 9/23 >>  Subjective: Wanting to walk. Has no other complaints. Noted to have stool on the bed. Not aware rectal tube has come out. ROS: no complaints of nausea, vomiting, constipation diarrhea, cough, dyspnea or dysuria. No other complaints.   Assessment & Plan:   Principal Problem:   Cardiogenic Shock due to Bradycardia - h/o A-fib - suspected due to Timolol eye drops - treated with Epi infusion - Cards recommends 30 day monitor on d/c  - will discuss with cardiology today if Amio should be resumed - now on Coumadin- previously on Eliquis    Acute kidney injury superimposed on chronic kidney disease  - s/p CRRT - Cr now stable, making adequate urine and per renal, currently no need to continue dialysis  Acute respiratory failure/ aspiration pneumonia - finished a course of Zosyn- on room air and breathing well  Acute encephalopathy - apparently is improving per PCCM notes- follow - head CT unrevealing  Right ankle wound - cont recommendations per wound care- followed by wound care center as outpt  Acute on chronic diastolic CHF - resolved with CVVHD   NOTE: remove foley and rectal tube today.  PT recommends HHPT   DVT prophylaxis: SCDs Code Status: DNR Family Communication:  Disposition Plan: home with HHPT Consultants:   EP  Cardiology  Nephrology  PCCM  Antimicrobials:  Anti-infectives    Start     Dose/Rate Route Frequency Ordered Stop   11/03/16 1400  piperacillin-tazobactam (ZOSYN) IVPB 2.25 g  Status:  Discontinued     2.25 g 100 mL/hr over 30  Minutes Intravenous Every 8 hours 11/03/16 1003 11/05/16 1456   10/29/16 1800  piperacillin-tazobactam (ZOSYN) IVPB 2.25 g  Status:  Discontinued     2.25 g 100 mL/hr over 30 Minutes Intravenous Every 6 hours 10/29/16 1535 11/03/16 1003   10/27/16 1000  piperacillin-tazobactam (ZOSYN) IVPB 3.375 g  Status:  Discontinued     3.375 g 100 mL/hr over 30 Minutes Intravenous Every 12 hours 10/27/16 0306 10/27/16 0320   10/27/16 0400  piperacillin-tazobactam (ZOSYN) IVPB 2.25 g  Status:  Discontinued     2.25 g 100 mL/hr over 30 Minutes Intravenous Every 8 hours 10/27/16 0320 10/29/16 1535   10/27/16 0315  vancomycin (VANCOCIN) IVPB 1000 mg/200 mL premix     1,000 mg 200 mL/hr over 60 Minutes Intravenous  Once 10/27/16 0306 10/27/16 0608       Objective: Vitals:   11/07/16 1900 11/07/16 2000 11/07/16 2242 11/08/16 0408  BP: 128/80 118/67 125/64 125/65  Pulse:  73 79 74  Resp: (!) 26 (!) 30 20 20   Temp:  (!) 97.4 F (36.3 C) 99 F (37.2 C) 98.7 F (37.1 C)  TempSrc:  Axillary Axillary Axillary  SpO2: 92% 98% 99% 100%  Weight:   90.7 kg (200 lb)   Height:   5\' 8"  (1.727 m)     Intake/Output Summary (Last 24 hours) at 11/08/16 0747 Last data filed at 11/08/16 0600  Gross per 24 hour  Intake          1050.17 ml  Output  1250 ml  Net          -199.83 ml   Filed Weights   11/06/16 0332 11/07/16 0500 11/07/16 2242  Weight: 90.2 kg (198 lb 13.7 oz) 88.2 kg (194 lb 7.1 oz) 90.7 kg (200 lb)    Examination: General exam: Appears comfortable  HEENT: PERRLA, oral mucosa moist, no sclera icterus or thrush Respiratory system: Clear to auscultation. Respiratory effort normal. Cardiovascular system: S1 & S2 heard, RRR.  No murmurs  Gastrointestinal system: Abdomen soft, non-tender, nondistended. Normal bowel sound. No organomegaly Central nervous system: Alert and oriented. No focal neurological deficits. Extremities: No cyanosis, clubbing or edema Skin: No rashes or  ulcers Psychiatry:  Mood & affect appropriate.     Data Reviewed: I have personally reviewed following labs and imaging studies  CBC:  Recent Labs Lab 11/02/16 0251 11/03/16 0512 11/04/16 0415 11/05/16 0356 11/06/16 0319 11/07/16 0313 11/08/16 0423  WBC 5.9 7.6 8.7 10.1 11.1* 10.8* 9.5  NEUTROABS 4.5 5.6  --   --   --   --   --   HGB 11.6* 11.5* 10.9* 10.6* 10.5* 11.5* 11.3*  HCT 35.6* 35.1* 33.5* 32.9* 33.3* 35.4* 35.3*  MCV 94.4 93.9 94.9 95.9 96.5 97.5 96.7  PLT 144* 171 210 246 256 284 710   Basic Metabolic Panel:  Recent Labs Lab 11/03/16 0512 11/04/16 0415 11/05/16 0356 11/06/16 0319 11/07/16 0313 11/08/16 0423  NA 139 142 147* 142 144 141  K 3.7 3.6 3.7 3.7 4.0 3.6  CL 102 107 109 106 106 100*  CO2 25 24 25 27 25 26   GLUCOSE 103* 92 101* 99 90 88  BUN 65* 87* 104* 55* 68* 63*  CREATININE 3.82* 4.75* 5.36* 3.59* 4.12* 4.08*  CALCIUM 9.0 9.0 8.9 8.7* 9.0 8.6*  MG 2.5* 2.6* 2.7* 2.2 2.4  --   PHOS 4.1 5.5* 6.9* 5.0* 7.3* 5.0*   GFR: Estimated Creatinine Clearance: 16.6 mL/min (A) (by C-G formula based on SCr of 4.08 mg/dL (H)). Liver Function Tests:  Recent Labs Lab 11/03/16 0512 11/04/16 0415 11/05/16 0356 11/06/16 0319 11/07/16 0313  AST  --   --   --  72*  --   ALT  --   --   --  183*  --   ALKPHOS  --   --   --  185*  --   BILITOT  --   --   --  2.4*  --   PROT  --   --   --  6.9  --   ALBUMIN 2.7* 2.5* 2.6* 2.6*  2.6* 2.7*   No results for input(s): LIPASE, AMYLASE in the last 168 hours. No results for input(s): AMMONIA in the last 168 hours. Coagulation Profile:  Recent Labs Lab 11/05/16 1545 11/06/16 0319 11/07/16 0313 11/08/16 0423  INR 1.15 1.24 1.24 1.26   Cardiac Enzymes: No results for input(s): CKTOTAL, CKMB, CKMBINDEX, TROPONINI in the last 168 hours. BNP (last 3 results) No results for input(s): PROBNP in the last 8760 hours. HbA1C: No results for input(s): HGBA1C in the last 72 hours. CBG:  Recent Labs Lab  11/07/16 1409 11/07/16 1706 11/07/16 2031 11/08/16 0004 11/08/16 0404  GLUCAP 85 83 86 119* 95   Lipid Profile:  Recent Labs  11/06/16 0319  TRIG 88   Thyroid Function Tests: No results for input(s): TSH, T4TOTAL, FREET4, T3FREE, THYROIDAB in the last 72 hours. Anemia Panel:  Recent Labs  11/05/16 1225  TIBC 305  IRON 33*  Urine analysis:    Component Value Date/Time   COLORURINE YELLOW 10/27/2016 0113   APPEARANCEUR CLEAR 10/27/2016 0113   LABSPEC 1.013 10/27/2016 0113   PHURINE 6.0 10/27/2016 0113   GLUCOSEU NEGATIVE 10/27/2016 0113   HGBUR NEGATIVE 10/27/2016 0113   BILIRUBINUR NEGATIVE 10/27/2016 0113   KETONESUR NEGATIVE 10/27/2016 0113   PROTEINUR 100 (A) 10/27/2016 0113   NITRITE NEGATIVE 10/27/2016 0113   LEUKOCYTESUR NEGATIVE 10/27/2016 0113   Sepsis Labs: @LABRCNTIP (procalcitonin:4,lacticidven:4) )No results found for this or any previous visit (from the past 240 hour(s)).       Radiology Studies: No results found.    Scheduled Meds: . Chlorhexidine Gluconate Cloth  6 each Topical Daily  . Influenza vac split quadrivalent PF  0.5 mL Intramuscular Tomorrow-1000  . insulin aspart  0-15 Units Subcutaneous Q4H  . levothyroxine  175 mcg Oral QAC breakfast  . mouth rinse  15 mL Mouth Rinse BID  . pantoprazole sodium  40 mg Per Tube Q1200  . patient's guide to using coumadin book   Does not apply Once  . sodium chloride flush  10-40 mL Intracatheter Q12H  . Warfarin - Pharmacist Dosing Inpatient   Does not apply q1800   Continuous Infusions: . sodium chloride Stopped (10/27/16 1515)  . sodium chloride    . sodium chloride Stopped (11/06/16 2001)  . dextrose 30 mL/hr at 11/06/16 2001     LOS: 12 days    Time spent in minutes: [place], MD [facility] Pager: [email or web address] [phone] [email or web address] 11/08/2016, 7:47 AM

## 2016-11-08 NOTE — Progress Notes (Signed)
Nutrition Follow-up  DOCUMENTATION CODES:   Obesity unspecified  INTERVENTION:   Continue to encourage PO intake Further diet education prior to d/c as needed.    NUTRITION DIAGNOSIS:   Inadequate oral intake related to inability to eat as evidenced by NPO status. Resolved.   GOAL:   Patient will meet greater than or equal to 90% of their needs Met.   MONITOR:   Diet advancement, I & O's  ASSESSMENT:   Pt with PMH significant for CHF, CKD stage IV (seen by nephrologist this week, discussing possible dialysis), thyroid disease, and DM. Presents this admission with being poorly responsive after opthalmic surgery (9/21). Pt renal function worsening at this time, may possibly need CRRT.   10/2 pt extubated 10/3 passed swallow eval   HD was being considered PTA but not yet started CRRT 9/25-9/28 HD 10/1 but no further treatments scheduled  Pt is - 4.8 L UOP 1250 ml x 24 hours  Medications and labs reviewed and include: PO4 5 (H)   Diet Order:  Diet Carb Modified Fluid consistency: Thin; Room service appropriate? Yes  Skin:   (DM R ankle wound)  Last BM:  10/2 small  Height:   Ht Readings from Last 1 Encounters:  11/07/16 '5\' 8"'  (1.727 m)    Weight:   Wt Readings from Last 1 Encounters:  11/07/16 200 lb (90.7 kg)    Ideal Body Weight:  70 kg  BMI:  Body mass index is 30.41 kg/m.  Estimated Nutritional Needs:   Kcal:  2000-2200  Protein:  110-125 grams  Fluid:  2 L/day  EDUCATION NEEDS:   No education needs identified at this time  [place], [place], [place] Pager [phone] After Hours Pager

## 2016-11-08 NOTE — Progress Notes (Signed)
It appears Omar Lambert has been maintaining sinus rhythm. Given his recent bradycardia, I would hold off on restarting amiodarone and recommend the outpatient 30 day monitor after discharge. He should follow-up with Dr. Oval Linsey.  Pixie Casino, MD, [place]  Attending Cardiologist  Direct Dial: [phone]  Fax: [phone]  Website:  [email or web address]

## 2016-11-09 ENCOUNTER — Encounter: Payer: Self-pay | Admitting: Physician Assistant

## 2016-11-09 DIAGNOSIS — Z23 Encounter for immunization: Secondary | ICD-10-CM | POA: Diagnosis not present

## 2016-11-09 LAB — GLUCOSE, CAPILLARY
GLUCOSE-CAPILLARY: 75 mg/dL (ref 65–99)
GLUCOSE-CAPILLARY: 70 mg/dL (ref 65–99)
Glucose-Capillary: 95 mg/dL (ref 65–99)
Glucose-Capillary: 67 mg/dL (ref 65–99)

## 2016-11-09 LAB — PROTIME-INR
INR: 1.18
PROTHROMBIN TIME: 14.9 s (ref 11.4–15.2)

## 2016-11-09 LAB — RENAL FUNCTION PANEL
ALBUMIN: 2.8 g/dL — AB (ref 3.5–5.0)
ANION GAP: 10 (ref 5–15)
BUN: 61 mg/dL — AB (ref 6–20)
CALCIUM: 8.8 mg/dL — AB (ref 8.9–10.3)
CO2: 24 mmol/L (ref 22–32)
CREATININE: 4.31 mg/dL — AB (ref 0.61–1.24)
Chloride: 103 mmol/L (ref 101–111)
GFR calc Af Amer: 14 mL/min — ABNORMAL LOW (ref >60)
GFR calc non Af Amer: 12 mL/min — ABNORMAL LOW (ref >60)
GLUCOSE: 71 mg/dL (ref 65–99)
PHOSPHORUS: 4.6 mg/dL (ref 2.5–4.6)
Potassium: 3.6 mmol/L (ref 3.5–5.1)
SODIUM: 137 mmol/L (ref 135–145)

## 2016-11-09 MED ORDER — WARFARIN SODIUM 5 MG PO TABS: 5.0000 mg | ORAL_TABLET | Freq: Once | ORAL | Status: DC

## 2016-11-09 MED ORDER — WARFARIN SODIUM 5 MG PO TABS: 5.0000 mg | ORAL_TABLET | Freq: Once | ORAL | 0 refills | Status: DC

## 2016-11-09 MED ORDER — SODIUM CHLORIDE 0.9 % IV SOLN
510.0000 mg | Freq: Once | INTRAVENOUS | Status: AC
  Administered 2016-11-09: 510 mg via INTRAVENOUS
  Filled 2016-11-09: qty 17

## 2016-11-09 NOTE — Progress Notes (Signed)
[facility] Rounding Note  Subjective:    CRRT 9/25-9/28 Hemodialysis 10/1 HD 10/1  Fem cath removed  UOP has dropped off some Creatinine "stuck" in the 4's (2.4 on 10/22/16 at [facility] appt with Dr. Jimmy Footman) Getting IVF at 59 cc/hour since late yesterday (?)  Objective Vital signs in last 24 hours: Vitals:   11/08/16 0900 11/08/16 1835 11/08/16 2137 11/09/16 0415  BP: 111/61 (!) 112/58 (!) 128/58 132/70  Pulse: 81 78 77 71  Resp: 19 18 17 18   Temp: 98.4 F (36.9 C) 98.6 F (37 C) 98.7 F (37.1 C) 98.4 F (36.9 C)  TempSrc: Axillary Oral Oral Oral  SpO2: 95% 96% 94% 93%  Weight:   90.2 kg (198 lb 12.8 oz)   Height:       Weight change: -0.544 kg (-1 lb 3.2 oz)  Intake/Output Summary (Last 24 hours) at 11/09/16 0816 Last data filed at 11/09/16 0600  Gross per 24 hour  Intake             1570 ml  Output              765 ml  Net              805 ml   Physical Exam: Family in with patient Awake, alert, quite pleasant Says he "feels fine" Just cleaned his plate at breakfast [id number] No S3 Distant heart sounds Lungs are clear Obese soft non tender abdomen No LE edema  IVF infusing at 75 cc/hour   Recent Labs Lab 11/07/16 0313 11/08/16 0423 11/09/16 0621  NA 144 141 137  K 4.0 3.6 3.6  CL 106 100* 103  CO2 25 26 24   GLUCOSE 90 88 71  BUN 68* 63* 61*  CREATININE 4.12* 4.08* 4.31*  CALCIUM 9.0 8.6* 8.8*  PHOS 7.3* 5.0* 4.6   Lab Results  Component Value Date   INR 1.18 11/09/2016   INR 1.26 11/08/2016   INR 1.24 11/07/2016    Recent Labs Lab 11/06/16 0319 11/07/16 0313 11/09/16 0621  AST 72*  --   --   ALT 183*  --   --   ALKPHOS 185*  --   --   BILITOT 2.4*  --   --   PROT 6.9  --   --   ALBUMIN 2.6*  2.6* 2.7* 2.8*    Recent Labs Lab 11/03/16 0512 11/04/16 0415 11/05/16 0356 11/06/16 0319 11/07/16 0313 11/08/16 0423  WBC 7.6 8.7 10.1 11.1* 10.8* 9.5  NEUTROABS 5.6  --   --   --   --   --   HGB 11.5* 10.9* 10.6* 10.5* 11.5* 11.3*   HCT 35.1* 33.5* 32.9* 33.3* 35.4* 35.3*  MCV 93.9 94.9 95.9 96.5 97.5 96.7  PLT 171 210 246 256 284 359    Recent Labs Lab 11/08/16 0834 11/08/16 1210 11/08/16 1736 11/08/16 2232 11/09/16 0150  GLUCAP 97 98 118* 92 95   Medications: Infusions: . sodium chloride Stopped (10/27/16 1515)  . sodium chloride    . sodium chloride Stopped (11/06/16 2001)  . sodium chloride 75 mL/hr at 11/08/16 1716    Scheduled Medications: . allopurinol  300 mg Oral Daily  . Chlorhexidine Gluconate Cloth  6 each Topical Daily  . insulin aspart  0-15 Units Subcutaneous TID WC  . levothyroxine  175 mcg Oral QAC breakfast  . mouth rinse  15 mL Mouth Rinse BID  . pantoprazole  40 mg Oral Daily  . patient's guide to using coumadin  book   Does not apply Once  . sodium chloride flush  10-40 mL Intracatheter Q12H  . Warfarin - Pharmacist Dosing Inpatient   Does not apply q1800    Assessment/ Plan: 76 y.o. yo male PMH DM, CHF, T4 ds, gout, HLD, AFib//flutter, baseline CKD Stage 4 (followed at [facility]/Dr. Deterding), AVF plans and CKD planning had been underway week prior to admission).  Admitted 10/27/2016 poorly responsive after eye surgery w/profound bradycardia/hypotension. AKI on CKD. CRRT 9/25-9/28.IHD 10/1. Some spontaneous recovery.  (Baseline creatinine 2.4 on 10/22/16 at [facility] prior to current events).  1. Non oliguric AKI on CKD4 - hemodynamically driven. BL creatinine 2.4 as recently as 10/22/16 ([facility]). At time of initial consult was uremic. CRRT inititated  in setting of critical illness 9/25-9/28. Wt decreased 10 kg. Hemodialysis 10/1 for azotemia. No further HD was required. Temp cath removed. Since that time still making urine. Hope to see clear  trend down but not yet - "stuck in the 4's".  1. Don't see need for additional IVF's at this time (initiated by primary team yesterday, will reduce rate to 50 pending their seeing pt this AM) 2. Continue to trend creatinine 2. Anemia- hgb still  >10. No intervention needed  3. Acute bradycardic event post eye surgery - nuclear EF 47%, low risk for ischemia. Trops neg. No other inpt w/u planned. For 30 day monitor after discharge. Cards has signed off. 4. HCAP zosyn completed 5. VDRF - Vol xs/ALI/HCAP. resolved. No issues post extubation. 6. DM per primary team 7. Hypothyroidism getting levothyroxine 8. HLD 9. AFF - warfarin per pharmacy.  1. I think once CLEAR trending down of creatinine would be OK to use attenuated dose of Eliquis.   Jamal Maes, MD [facility] [phone] Pager 11/09/2016, 8:16 AM

## 2016-11-09 NOTE — Discharge Summary (Addendum)
Physician Discharge Summary  Omar Lambert VFI:[id number] DOB: [birth year] DOA: 10/27/2016  PCP: [name]   Admit date: 10/27/2016 Discharge date: 11/09/2016  Admitted From: home  Disposition:  home   Recommendations for Outpatient Follow-up:  1.  determine when Eliquis can be resumed base on renal function- currently on Coumadin 2. 30 day event monitor per cardiology  Home Health:  HHPT    Discharge Condition:  stable   CODE STATUS:  DNR   Consultations:  EP  Cardiology  Nephrology  PCCM   Discharge Diagnoses:  Principal Problem:   Acute kidney injury superimposed on chronic kidney disease ([place]) Active Problems:   Shock ([place])   Bradycardia with 41-50 beats per minute   Acute on chronic diastolic CHF (congestive heart failure), NYHA class 4 ([place])   Atrial fibrillation with slow ventricular response ([place])   Encounter for central line placement   Acute respiratory failure ([place])    Subjective: No complaints today.   Brief Summary: Omar Lambert 76 y/o with CKD 3-4, A-fib/flutter, DM who underwent opthalmic surgery on 9/21 and  presented on 9/22 for unresponsiveness and bradycardia. Intubated, given atropine, started on Epinephrine and admitted to the ICU and intubated. Required CVVHD while in ICU due to AKI,  ENDOTRACHEAL INTUBATION 9/23>>ETT 9/23  Hospital Course:  Principal Problem:   Cardiogenic Shock due to Bradycardia - h/o A-fib - suspected due to Timolol eye drops in setting of Amio use - treated with Epi infusion - 2 D ECHO report below - Cards recommends 30 day monitor on d/c and to continue to hold Amiodarone as he has been maintaining sinus rhythm - now on Coumadin due to worsening renal function- previously on Eliquis- if renal function improves to his baseline which was ~ 2, Eliquis can be resumed - have arranged f/u at Coumadin clinic on Monday- INR 1.18 today- cont Coumadin at 5 mg daily until then    Acute kidney injury superimposed on chronic  kidney disease  - s/p CRRT - Cr now stable at 4 but has not improved to his baseline yet which is ~ 2 - making adequate urine and per renal, currently no need to continue dialysis - Demedex stopped by renal team for now  Acute respiratory failure/ aspiration pneumonia - finished a course of Zosyn- on room air and breathing well  Acute encephalopathy - head CT unrevealing - cognitively back to baseline  Right ankle wound - cont recommendations per wound care- followed by wound care center as outpt which I have told him he should continue to do  Acute on chronic diastolic CHF - resolved with CVVHD - Nephrology recommends holding Torsemide   Discharge Instructions  Discharge Instructions    Diet - low sodium heart healthy    Complete by:  As directed    And Carb modified   Increase activity slowly    Complete by:  As directed      Allergies as of 11/09/2016   No Known Allergies     Medication List    STOP taking these medications   amiodarone 200 MG tablet Commonly known as:  PACERONE   ELIQUIS 5 MG Tabs tablet Generic drug:  apixaban   torsemide 20 MG tablet Commonly known as:  DEMADEX     TAKE these medications   acetaminophen 325 MG tablet Commonly known as:  TYLENOL Take 650 mg by mouth every 6 (six) hours as needed for mild pain.   allopurinol 300 MG tablet Commonly known as:  ZYLOPRIM  Take 300 mg by mouth daily.   fluticasone 50 MCG/ACT nasal spray Commonly known as:  FLONASE Place 1 spray into both nostrils 2 (two) times daily.   levothyroxine 175 MCG tablet Commonly known as:  SYNTHROID, LEVOTHROID Take 175 mcg by mouth daily before breakfast.   linagliptin 5 MG Tabs tablet Commonly known as:  TRADJENTA Take 5 mg by mouth daily.   pantoprazole 40 MG tablet Commonly known as:  PROTONIX Take 40 mg by mouth daily.   pravastatin 20 MG tablet Commonly known as:  PRAVACHOL Take 20 mg by mouth daily.   warfarin 5 MG tablet Commonly known  as:  COUMADIN Take 1 tablet (5 mg total) by mouth one time only at 6 PM. This dose will need to be adjusted based on INR results      Follow-up Information    Royal, Rory Percy, RN Follow up.        [facility] Follow up.   Why:  Coumadin Clinic : Monday Nov 12, 2016 at [facility] information: [address] [place], [place] [address] Ph [phone]       Skeet Latch, MD Follow up.   Specialty:  Cardiology Why:  you will need to go to their office to pick up an event monitor and to f/u with Dr Robynn Pane about your heart issues.  Contact information: [address] [place] [address] [phone]        [name] L, DO Follow up.   Specialty:  Geriatric Medicine Contact information: [place]. [place] [address] [phone]          No Known Allergies   Procedures/Studies: Intubation/ Extubation RIGHT IJ CVC 9/23>> A-LINE 9/23 >>9/28  2 D ECHO 9/22 Left ventricle: The cavity size was normal. Wall thickness was   increased in a pattern of moderate LVH. Systolic function was   vigorous. The estimated ejection fraction was in the range of 65%   to 70%. Wall motion was normal; there were no regional wall   motion abnormalities. - Ventricular septum: The contour showed diastolic flattening. - Right ventricle: The cavity size was moderately dilated. - Right atrium: The atrium was moderately dilated. - Tricuspid valve: There was severe regurgitation. - Pulmonary arteries: Systolic pressure was mildly to moderately   increased. PA peak pressure: 38 mm Hg (S).  Ct Head Wo Contrast  Result Date: 10/27/2016 CLINICAL DATA:  Altered level of consciousness. EXAM: CT HEAD WITHOUT CONTRAST TECHNIQUE: Contiguous axial images were obtained from the base of the skull through the vertex without intravenous contrast. COMPARISON:  None. FINDINGS: BRAIN: No intraparenchymal hemorrhage, mass effect nor midline shift. The  ventricles and sulci are normal for age. No acute large vascular territory infarcts. No abnormal extra-axial fluid collections. Basal cisterns are patent. VASCULAR: Mild calcific atherosclerosis of the carotid siphons. SKULL: No skull fracture. No significant scalp soft tissue swelling. SINUSES/ORBITS: The mastoid air-cells and included paranasal sinuses are well-aerated.The included ocular globes and orbital contents are non-suspicious. Status post RIGHT ocular lens implant. Prominent bilateral superior ophthalmic veins seen with bowel solid. OTHER: LEFT nasogastric tube. IMPRESSION: Negative noncontrast CT HEAD for age. Nasogastric tube terminates in pharynx.  Recommend advancement. Electronically Signed   By: Elon Alas M.D.   On: 10/27/2016 01:14   Dg Chest Portable 1 View  Result Date: 11/06/2016 CLINICAL DATA:  76 year old male with respiratory failure. Subsequent encounter. EXAM: PORTABLE CHEST 1 VIEW COMPARISON:  11/05/2016. FINDINGS: Rotation to  the left. Endotracheal tube tip 4.2 cm above the carina. Nasogastric tube courses below the diaphragm. Tip is not included on the present exam. Right central line tip proximal superior vena cava level. Heart may be top-normal to slightly enlarged. Left base opacification suggestive of atelectasis with small pleural effusion. Pulmonary vascular congestion/mild congestive heart failure. No gross pneumothorax. Calcified slightly tortuous aorta. IMPRESSION: Rotated exam. Left base atelectasis/small pleural effusion. Pulmonary vascular congestion/mild pulmonary edema similar to prior exam. Aortic Atherosclerosis (ICD10-I70.0). Electronically Signed   By: Genia Del M.D.   On: 11/06/2016 07:52   Dg Chest Port 1 View  Result Date: 11/05/2016 CLINICAL DATA:  Respiratory failure, intubated patient. History of CHF, bradycardia, acute renal insufficiency. EXAM: PORTABLE CHEST 1 VIEW COMPARISON:  Portable chest x-ray of November 02, 2016 FINDINGS: The lungs  are adequately inflated. The interstitial markings remain mildly increased. The retrocardiac region on the left remains dense and there is persistent obscuration of the left hemidiaphragm. The cardiac silhouette is top-normal to mildly enlarged. The pulmonary vascularity is mildly engorged. There is calcification in the wall of the thoracic aorta. The endotracheal tube tip lies 3.7 cm above the carina. The esophagogastric tube tip projects below the inferior margin of the image. The right internal jugular Cordis sheath tip projects over the junction of the proximal and midportions of the SVC. IMPRESSION: Persistent bibasilar subsegmental atelectasis. No significant pleural effusion. Mild interstitial prominence consistent with low-grade CHF. The support tubes are in reasonable position. Thoracic aortic atherosclerosis. Electronically Signed   By: David  Martinique M.D.   On: 11/05/2016 07:44   Dg Chest Port 1 View  Result Date: 11/02/2016 CLINICAL DATA:  Adjustment of endotracheal tube EXAM: PORTABLE CHEST 1 VIEW COMPARISON:  11/01/2016, 10/27/2016 FINDINGS: Endotracheal tube tip is about 3.2 cm superior to the carina. Esophageal tube tip extends below diaphragm. Cardiomegaly with continued small pleural effusions and left lower lobe consolidation. Vascular congestion and mild perihilar edema. Aortic atherosclerosis. No pneumothorax. IMPRESSION: 1. Endotracheal tube tip about 3.2 cm superior to the carina 2. Cardiomegaly with vascular congestion and continued small pleural effusions, mild pulmonary edema, and left lung base consolidation. Electronically Signed   By: Donavan Foil M.D.   On: 11/02/2016 18:21   Dg Chest Port 1 View  Result Date: 11/01/2016 CLINICAL DATA:  Acute respiratory failure EXAM: PORTABLE CHEST 1 VIEW COMPARISON:  10/31/2016 FINDINGS: Cardiomegaly with vascular congestion. Layering bilateral effusions with bibasilar atelectasis, similar to prior study. Mild interstitial prominence could  reflect interstitial edema. IMPRESSION: Probable mild interstitial edema. Continued layering bilateral effusions with bibasilar atelectasis. Electronically Signed   By: Rolm Baptise M.D.   On: 11/01/2016 07:59   Dg Chest Port 1 View  Result Date: 10/31/2016 CLINICAL DATA:  Endotracheal tube.  Respiratory failure EXAM: PORTABLE CHEST 1 VIEW COMPARISON:  Yesterday FINDINGS: Endotracheal tube tip between the clavicular heads and carina. Right IJ central line with tip at the upper SVC. An orogastric tube reaches the stomach at least. Haziness of the lower chest that is likely combination of atelectasis and pleural fluid. No Kerley lines or air bronchograms. No pneumothorax. Large lung volumes. Likely stable heart size, accentuated by rotation. IMPRESSION: 1. Stable positioning of tubes and central line. 2. Layering pleural effusions and presumed atelectasis. Obscured left more than right lower lobes. Electronically Signed   By: Monte Fantasia M.D.   On: 10/31/2016 07:30   Dg Chest Portable 1 View  Result Date: 10/30/2016 CLINICAL DATA:  Respiratory failure. EXAM: PORTABLE CHEST 1 VIEW  COMPARISON:  Radiograph of October 29, 2016. FINDINGS: Stable cardiomegaly. Atherosclerosis of thoracic aorta is noted. Endotracheal and nasogastric tubes are unchanged in position. Right internal jugular catheter is unchanged in position. Stable bibasilar atelectasis or edema is noted with mild associated pleural effusions. Bony thorax is unremarkable. IMPRESSION: Aortic atherosclerosis. Stable support apparatus. Stable bibasilar atelectasis or edema is noted with mild associated pleural effusions. Electronically Signed   By: Marijo Conception, M.D.   On: 10/30/2016 07:28   Dg Chest Port 1 View  Result Date: 10/29/2016 CLINICAL DATA:  Attempted placement of left internal jugular venous catheter with inability to advance the catheter. EXAM: PORTABLE CHEST 1 VIEW COMPARISON:  Portable chest x-ray of earlier today. FINDINGS: The  lungs are well-expanded. There is no postprocedure pneumothorax. There remain increased lung markings at both bases likely reflecting pleural fluid layering posteriorly. The cardiac silhouette remains mildly enlarged. The central pulmonary vascularity is mildly prominent. There is calcification in the wall of the aortic arch. The endotracheal tube tip measures approximately 5.6 cm above the carina. The esophagogastric tube tip projects below the inferior margin of the image. The right internal jugular venous catheter tip projects over the proximal SVC. IMPRESSION: No postprocedure pneumothorax or hemothorax following left internal jugular catheter placement attempt. Stable underlying mild CHF. Bilateral pleural effusions layering posteriorly. Thoracic aortic atherosclerosis. The support tubes are in stable position. Electronically Signed   By: David  Martinique M.D.   On: 10/29/2016 15:32   Dg Chest Port 1 View  Result Date: 10/29/2016 CLINICAL DATA:  Respiratory failure.  Endotracheal tube present. EXAM: PORTABLE CHEST 1 VIEW COMPARISON:  10/27/2016 FINDINGS: Endotracheal tube terminates 3.3 cm above carina.Nasogastric extends beyond the inferior aspect of the film. Right internal jugular line tip at mid SVC. Midline trachea. Cardiomegaly accentuated by AP portable technique. Atherosclerosis in the transverse aorta. Probable small bilateral pleural effusions. Mild pulmonary venous congestion. Persistent bibasilar airspace disease. IMPRESSION: Given differences in technique, similar cardiomegaly with mild pulmonary venous congestion and small bilateral pleural effusions. Electronically Signed   By: Abigail Miyamoto M.D.   On: 10/29/2016 07:10   Dg Chest Portable 1 View  Result Date: 10/27/2016 CLINICAL DATA:  Central line placement EXAM: PORTABLE CHEST 1 VIEW COMPARISON:  10/27/2016 FINDINGS: Endotracheal tube tip measures 3.8 cm above the carina. Enteric tube tip is off the field of view but below the left  hemidiaphragm. A right central venous catheter has been placed with tip over the mid SVC region. No pneumothorax. Cardiac enlargement. No vascular congestion. Small bilateral pleural effusions, greater on the right. Atelectasis or infiltration in both bases, also greater on the right. Calcification of the aorta. IMPRESSION: Appliances appear in satisfactory present shin. Persistent bilateral pleural effusions with infiltration or atelectasis in the lung bases, greater on the right. Electronically Signed   By: Lucienne Capers M.D.   On: 10/27/2016 03:02   Dg Chest Port 1 View  Result Date: 10/27/2016 CLINICAL DATA:  Post intubation. EXAM: PORTABLE CHEST 1 VIEW COMPARISON:  None. FINDINGS: Endotracheal tube tip projects 3.1 cm above the carina. Nasogastric tube past proximal stomach, distal tip not imaged. Cardiac silhouette is mild-to-moderately enlarged. Calcified aortic knob. Pulmonary vascular congestion. Layering small to moderate RIGHT pleural effusion with interstitial to lesser extent faint alveolar airspace opacities. No pneumothorax. Soft tissue planes included osseous structures are nonsuspicious. IMPRESSION: Endotracheal tube tip projects 3.1 cm above the carina. Nasogastric tube past proximal stomach. Cardiomegaly, interstitial and to lesser extent alveolar edema with small to moderate layering  RIGHT pleural effusion. Aortic Atherosclerosis (ICD10-I70.0). Electronically Signed   By: Elon Alas M.D.   On: 10/27/2016 01:18   Dg Abd Portable 1v  Result Date: 10/27/2016 CLINICAL DATA:  Verify orogastric tube placement. EXAM: PORTABLE ABDOMEN - 1 VIEW COMPARISON:  None. FINDINGS: The bowel gas pattern is normal. No radio-opaque calculi or other significant radiographic abnormality are seen. Nasogastric tube tip projects in distal stomach. IMPRESSION: Nasogastric tube tip projects in distal stomach. Nonspecific bowel gas pattern. Electronically Signed   By: Elon Alas M.D.   On:  10/27/2016 01:24       Discharge Exam: Vitals:   11/09/16 0415 11/09/16 1024  BP: 132/70 124/62  Pulse: 71 72  Resp: 18 17  Temp: 98.4 F (36.9 C) 98 F (36.7 C)  SpO2: 93% 100%   Vitals:   11/08/16 1835 11/08/16 2137 11/09/16 0415 11/09/16 1024  BP: (!) 112/58 (!) 128/58 132/70 124/62  Pulse: 78 77 71 72  Resp: 18 17 18 17   Temp: 98.6 F (37 C) 98.7 F (37.1 C) 98.4 F (36.9 C) 98 F (36.7 C)  TempSrc: Oral Oral Oral Oral  SpO2: 96% 94% 93% 100%  Weight:  90.2 kg (198 lb 12.8 oz)    Height:        General: Pt is alert, awake, not in acute distress Cardiovascular: RRR, S1/S2 +, no rubs, no gallops Respiratory: CTA bilaterally, no wheezing, no rhonchi Abdominal: Soft, NT, ND, bowel sounds + Extremities: no edema, no cyanosis    The results of significant diagnostics from this hospitalization (including imaging, microbiology, ancillary and laboratory) are listed below for reference.     Microbiology: No results found for this or any previous visit (from the past 240 hour(s)).   Labs: BNP (last 3 results) No results for input(s): BNP in the last 8760 hours. Basic Metabolic Panel:  Recent Labs Lab 11/03/16 0512 11/04/16 0415 11/05/16 0356 11/06/16 0319 11/07/16 0313 11/08/16 0423 11/09/16 0621  NA 139 142 147* 142 144 141 137  K 3.7 3.6 3.7 3.7 4.0 3.6 3.6  CL 102 107 109 106 106 100* 103  CO2 25 24 25 27 25 26 24   GLUCOSE 103* 92 101* 99 90 88 71  BUN 65* 87* 104* 55* 68* 63* 61*  CREATININE 3.82* 4.75* 5.36* 3.59* 4.12* 4.08* 4.31*  CALCIUM 9.0 9.0 8.9 8.7* 9.0 8.6* 8.8*  MG 2.5* 2.6* 2.7* 2.2 2.4  --   --   PHOS 4.1 5.5* 6.9* 5.0* 7.3* 5.0* 4.6   Liver Function Tests:  Recent Labs Lab 11/04/16 0415 11/05/16 0356 11/06/16 0319 11/07/16 0313 11/09/16 0621  AST  --   --  72*  --   --   ALT  --   --  183*  --   --   ALKPHOS  --   --  185*  --   --   BILITOT  --   --  2.4*  --   --   PROT  --   --  6.9  --   --   ALBUMIN 2.5* 2.6* 2.6*   2.6* 2.7* 2.8*   No results for input(s): LIPASE, AMYLASE in the last 168 hours. No results for input(s): AMMONIA in the last 168 hours. CBC:  Recent Labs Lab 11/03/16 0512 11/04/16 0415 11/05/16 0356 11/06/16 0319 11/07/16 0313 11/08/16 0423  WBC 7.6 8.7 10.1 11.1* 10.8* 9.5  NEUTROABS 5.6  --   --   --   --   --  HGB 11.5* 10.9* 10.6* 10.5* 11.5* 11.3*  HCT 35.1* 33.5* 32.9* 33.3* 35.4* 35.3*  MCV 93.9 94.9 95.9 96.5 97.5 96.7  PLT 171 210 246 256 284 359   Cardiac Enzymes: No results for input(s): CKTOTAL, CKMB, CKMBINDEX, TROPONINI in the last 168 hours. BNP: Invalid input(s): POCBNP CBG:  Recent Labs Lab 11/08/16 1736 11/08/16 2232 11/09/16 0150 11/09/16 0821 11/09/16 0923  GLUCAP 118* 92 95 67 75   D-Dimer No results for input(s): DDIMER in the last 72 hours. Hgb A1c No results for input(s): HGBA1C in the last 72 hours. Lipid Profile No results for input(s): CHOL, HDL, LDLCALC, TRIG, CHOLHDL, LDLDIRECT in the last 72 hours. Thyroid function studies No results for input(s): TSH, T4TOTAL, T3FREE, THYROIDAB in the last 72 hours.  Invalid input(s): FREET3 Anemia work up No results for input(s): VITAMINB12, FOLATE, FERRITIN, TIBC, IRON, RETICCTPCT in the last 72 hours. Urinalysis    Component Value Date/Time   COLORURINE YELLOW 10/27/2016 0113   APPEARANCEUR CLEAR 10/27/2016 0113   LABSPEC 1.013 10/27/2016 0113   PHURINE 6.0 10/27/2016 0113   GLUCOSEU NEGATIVE 10/27/2016 0113   HGBUR NEGATIVE 10/27/2016 0113   BILIRUBINUR NEGATIVE 10/27/2016 0113   KETONESUR NEGATIVE 10/27/2016 0113   PROTEINUR 100 (A) 10/27/2016 0113   NITRITE NEGATIVE 10/27/2016 0113   LEUKOCYTESUR NEGATIVE 10/27/2016 0113   Sepsis Labs Invalid input(s): PROCALCITONIN,  WBC,  LACTICIDVEN Microbiology No results found for this or any previous visit (from the past 240 hour(s)).   Time coordinating discharge: Over 30 minutes  SIGNED:   Debbe Odea, MD  [facility] 11/09/2016, 11:49 AM Pager   If 7PM-7AM, please contact night-coverage [email or web address] Password [email or web address]

## 2016-11-09 NOTE — Progress Notes (Signed)
ANTICOAGULATION CONSULT NOTE - Follow Up Consult  Pharmacy Consult for Coumadin Indication: atrial fibrillation  No Known Allergies  Patient Measurements: Height: 5\' 8"  (172.7 cm) Weight: 198 lb 12.8 oz (90.2 kg) IBW/kg (Calculated) : 68.4  Vital Signs: Temp: 98 F (36.7 C) (10/05 1024) Temp Source: Oral (10/05 1024) BP: 124/62 (10/05 1024) Pulse Rate: 72 (10/05 1024)  Labs:  Recent Labs  11/07/16 0313 11/08/16 0423 11/09/16 0621  HGB 11.5* 11.3*  --   HCT 35.4* 35.3*  --   PLT 284 359  --   LABPROT 15.5* 15.7* 14.9  INR 1.24 1.26 1.18  CREATININE 4.12* 4.08* 4.31*    Estimated Creatinine Clearance: 15.7 mL/min (A) (by C-G formula based on SCr of 4.31 mg/dL (H)).  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 Pharmacy is on board for warfarin dosing. The patient was started on lower doses initially given elevated LFTs - though trending down  INR today remains SUBtherapeutic (INR 1.18 << 1.26, goal of 2-3). Drop in INR likely related to missed dose 10/2 as the patient was given a higher dose yesterday evening.    Noted possible plans for discharge today - if discharged would recommend Warfarin 5 mg daily with a INR check scheduled for Monday, 10/8 given the patient is still a "new-start" on warfarin and full trends in INR have yet to be seen.   Goal of Therapy:  INR 2-3 Monitor platelets by anticoagulation protocol: Yes   Plan:  1. Warfarin 5 mg x 1 dose at 1800 today 2. Will continue to monitor for any signs/symptoms of bleeding and will follow up with PT/INR in the a.m.   Thank you for allowing pharmacy to be a part of this patient's care.  Alycia Rossetti, PharmD, BCPS Clinical Pharmacist Pager: [phone] Clinical phone for 11/09/2016 from 7a-3:30p: [phone] If after 3:30p, please call main pharmacy at: [phone] 11/09/2016 10:29 AM

## 2016-11-09 NOTE — Discharge Summary (Signed)
Pt given discharge instructions, prescriptions, and follow up info. Wife to drive pt home. Education about coumadin and follow up with coumadin clinic and cardiology clinic for heart monitor reviewed.

## 2016-11-09 NOTE — Discharge Instructions (Signed)

## 2016-11-09 NOTE — Care Management Note (Signed)
Case Management Note  Patient Details  Name: Omar Lambert MRN: [id number] Date of Birth: 1940/06/17  Subjective/Objective:   CM following for progression and d/c planning.                  Action/Plan: 11/09/2016 Met with pt and wife re d/c needs. Pt appointment scheduled for Coumadin Clinic on Monday, Nov 12, 2016 @ 11am at [facility] on [address] . Pt and his wife are familiar with the office. CM will arrange [facility] services, await MD orders.  Expected Discharge Date:       11/09/2016           Expected Discharge Plan:  [place]  In-House Referral:     Discharge planning Services  CM Consult  Post Acute Care Choice:  Home Health Choice offered to:  Spouse  DME Arranged:    DME Agency:     HH Arranged:  RN, PT HH Agency:   Nanine Means)  Status of Service:  In process, will continue to follow  If discussed at Long Length of Stay Meetings, dates discussed:    Additional Comments:  Adron Bene, RN 11/09/2016, 11:10 AM

## 2016-11-09 NOTE — Plan of Care (Signed)
Problem: Nutrition: Goal: Adequate nutrition will be maintained Outcome: Progressing Patient educated on importance of nutrition and ate all of his breakfast.

## 2016-11-12 ENCOUNTER — Ambulatory Visit (INDEPENDENT_AMBULATORY_CARE_PROVIDER_SITE_OTHER): Payer: Medicare HMO | Admitting: Pharmacist Clinician (PhC)/ Clinical Pharmacy Specialist

## 2016-11-12 ENCOUNTER — Telehealth: Payer: Self-pay

## 2016-11-12 DIAGNOSIS — I4891 Unspecified atrial fibrillation: Secondary | ICD-10-CM

## 2016-11-12 DIAGNOSIS — Z7901 Long term (current) use of anticoagulants: Secondary | ICD-10-CM | POA: Insufficient documentation

## 2016-11-12 LAB — POCT INR: INR: 1.3

## 2016-11-12 NOTE — Telephone Encounter (Signed)
I have made the 1st attempt to contact the patient or family member in charge, in order to follow up from recently being discharged from the hospital. I left a message on voicemail but I will make another attempt at a different time.  I have made the 2nd attempt to contact the patient or family member in charge, in order to follow up from recently being discharged from the hospital. I left a message on voicemail.

## 2016-11-13 DIAGNOSIS — R531 Weakness: Secondary | ICD-10-CM | POA: Diagnosis not present

## 2016-11-13 DIAGNOSIS — M1990 Unspecified osteoarthritis, unspecified site: Secondary | ICD-10-CM | POA: Diagnosis not present

## 2016-11-13 DIAGNOSIS — I5032 Chronic diastolic (congestive) heart failure: Secondary | ICD-10-CM | POA: Diagnosis not present

## 2016-11-13 DIAGNOSIS — N189 Chronic kidney disease, unspecified: Secondary | ICD-10-CM | POA: Diagnosis not present

## 2016-11-13 DIAGNOSIS — Z9181 History of falling: Secondary | ICD-10-CM | POA: Diagnosis not present

## 2016-11-13 DIAGNOSIS — Z5181 Encounter for therapeutic drug level monitoring: Secondary | ICD-10-CM | POA: Diagnosis not present

## 2016-11-13 DIAGNOSIS — Z7901 Long term (current) use of anticoagulants: Secondary | ICD-10-CM | POA: Diagnosis not present

## 2016-11-13 DIAGNOSIS — I4891 Unspecified atrial fibrillation: Secondary | ICD-10-CM | POA: Diagnosis not present

## 2016-11-16 DIAGNOSIS — Z7901 Long term (current) use of anticoagulants: Secondary | ICD-10-CM | POA: Diagnosis not present

## 2016-11-16 DIAGNOSIS — Z9181 History of falling: Secondary | ICD-10-CM | POA: Diagnosis not present

## 2016-11-16 DIAGNOSIS — R531 Weakness: Secondary | ICD-10-CM | POA: Diagnosis not present

## 2016-11-16 DIAGNOSIS — Z5181 Encounter for therapeutic drug level monitoring: Secondary | ICD-10-CM | POA: Diagnosis not present

## 2016-11-16 DIAGNOSIS — I5032 Chronic diastolic (congestive) heart failure: Secondary | ICD-10-CM | POA: Diagnosis not present

## 2016-11-16 DIAGNOSIS — N189 Chronic kidney disease, unspecified: Secondary | ICD-10-CM | POA: Diagnosis not present

## 2016-11-16 DIAGNOSIS — I4891 Unspecified atrial fibrillation: Secondary | ICD-10-CM | POA: Diagnosis not present

## 2016-11-16 DIAGNOSIS — M1990 Unspecified osteoarthritis, unspecified site: Secondary | ICD-10-CM | POA: Diagnosis not present

## 2016-11-19 ENCOUNTER — Ambulatory Visit: Payer: Medicare HMO | Admitting: Nurse Practitioner

## 2016-11-19 DIAGNOSIS — I4891 Unspecified atrial fibrillation: Secondary | ICD-10-CM | POA: Diagnosis not present

## 2016-11-19 DIAGNOSIS — Z7901 Long term (current) use of anticoagulants: Secondary | ICD-10-CM | POA: Diagnosis not present

## 2016-11-19 DIAGNOSIS — Z5181 Encounter for therapeutic drug level monitoring: Secondary | ICD-10-CM | POA: Diagnosis not present

## 2016-11-19 DIAGNOSIS — I5032 Chronic diastolic (congestive) heart failure: Secondary | ICD-10-CM | POA: Diagnosis not present

## 2016-11-19 DIAGNOSIS — Z9181 History of falling: Secondary | ICD-10-CM | POA: Diagnosis not present

## 2016-11-19 DIAGNOSIS — M1990 Unspecified osteoarthritis, unspecified site: Secondary | ICD-10-CM | POA: Diagnosis not present

## 2016-11-19 DIAGNOSIS — R531 Weakness: Secondary | ICD-10-CM | POA: Diagnosis not present

## 2016-11-19 DIAGNOSIS — N189 Chronic kidney disease, unspecified: Secondary | ICD-10-CM | POA: Diagnosis not present

## 2016-11-20 ENCOUNTER — Ambulatory Visit: Payer: Medicare HMO | Admitting: Cardiovascular Disease

## 2016-11-21 DIAGNOSIS — Z7901 Long term (current) use of anticoagulants: Secondary | ICD-10-CM | POA: Diagnosis not present

## 2016-11-21 DIAGNOSIS — I4891 Unspecified atrial fibrillation: Secondary | ICD-10-CM | POA: Diagnosis not present

## 2016-11-21 DIAGNOSIS — M1990 Unspecified osteoarthritis, unspecified site: Secondary | ICD-10-CM | POA: Diagnosis not present

## 2016-11-21 DIAGNOSIS — I5032 Chronic diastolic (congestive) heart failure: Secondary | ICD-10-CM | POA: Diagnosis not present

## 2016-11-21 DIAGNOSIS — R531 Weakness: Secondary | ICD-10-CM | POA: Diagnosis not present

## 2016-11-21 DIAGNOSIS — Z5181 Encounter for therapeutic drug level monitoring: Secondary | ICD-10-CM | POA: Diagnosis not present

## 2016-11-21 DIAGNOSIS — Z9181 History of falling: Secondary | ICD-10-CM | POA: Diagnosis not present

## 2016-11-21 DIAGNOSIS — N189 Chronic kidney disease, unspecified: Secondary | ICD-10-CM | POA: Diagnosis not present

## 2016-11-22 ENCOUNTER — Telehealth: Payer: Self-pay | Admitting: Cardiovascular Disease

## 2016-11-22 ENCOUNTER — Telehealth: Payer: Self-pay | Admitting: *Deleted

## 2016-11-22 NOTE — Telephone Encounter (Signed)
Nurse calling asking for verbal orders for nursing for patient. Order was already signed by Dr.[name] for PT, gave verbal for nursing and also scheduled pt for 12/03/2016.

## 2016-11-22 NOTE — Telephone Encounter (Signed)
Received records from [facility] for upcoming appointment on 11/29/16 @ 9am with Dr. Claiborne Billings. Records given to [facility] in Medical Records. 11/22/16 ab

## 2016-11-26 ENCOUNTER — Ambulatory Visit: Payer: Medicare HMO | Admitting: Internal Medicine

## 2016-11-26 DIAGNOSIS — T8131XA Disruption of external operation (surgical) wound, not elsewhere classified, initial encounter: Secondary | ICD-10-CM | POA: Diagnosis not present

## 2016-11-26 DIAGNOSIS — T8131XS Disruption of external operation (surgical) wound, not elsewhere classified, sequela: Secondary | ICD-10-CM | POA: Diagnosis not present

## 2016-11-29 ENCOUNTER — Ambulatory Visit: Payer: Medicare HMO | Admitting: Cardiovascular Disease

## 2016-11-29 ENCOUNTER — Encounter: Payer: Self-pay | Admitting: *Deleted

## 2016-12-03 ENCOUNTER — Encounter: Payer: Self-pay | Admitting: Physician Assistant

## 2016-12-03 ENCOUNTER — Encounter: Payer: Self-pay | Admitting: Internal Medicine

## 2016-12-03 ENCOUNTER — Ambulatory Visit (INDEPENDENT_AMBULATORY_CARE_PROVIDER_SITE_OTHER): Payer: Medicare HMO | Admitting: Physician Assistant

## 2016-12-03 ENCOUNTER — Ambulatory Visit (INDEPENDENT_AMBULATORY_CARE_PROVIDER_SITE_OTHER): Payer: Medicare HMO | Admitting: Pharmacist

## 2016-12-03 ENCOUNTER — Ambulatory Visit (INDEPENDENT_AMBULATORY_CARE_PROVIDER_SITE_OTHER): Payer: Medicare HMO | Admitting: Internal Medicine

## 2016-12-03 VITALS — BP 130/64 | HR 72 | Ht 65.0 in | Wt 220.8 lb

## 2016-12-03 VITALS — BP 120/60 | HR 69 | Temp 97.7°F | Wt 208.0 lb

## 2016-12-03 DIAGNOSIS — I10 Essential (primary) hypertension: Secondary | ICD-10-CM | POA: Diagnosis not present

## 2016-12-03 DIAGNOSIS — N183 Chronic kidney disease, stage 3 (moderate): Secondary | ICD-10-CM | POA: Diagnosis not present

## 2016-12-03 DIAGNOSIS — J449 Chronic obstructive pulmonary disease, unspecified: Secondary | ICD-10-CM | POA: Diagnosis not present

## 2016-12-03 DIAGNOSIS — D689 Coagulation defect, unspecified: Secondary | ICD-10-CM | POA: Diagnosis not present

## 2016-12-03 DIAGNOSIS — I129 Hypertensive chronic kidney disease with stage 1 through stage 4 chronic kidney disease, or unspecified chronic kidney disease: Secondary | ICD-10-CM | POA: Diagnosis not present

## 2016-12-03 DIAGNOSIS — E039 Hypothyroidism, unspecified: Secondary | ICD-10-CM

## 2016-12-03 DIAGNOSIS — N189 Chronic kidney disease, unspecified: Secondary | ICD-10-CM | POA: Diagnosis not present

## 2016-12-03 DIAGNOSIS — Z7901 Long term (current) use of anticoagulants: Secondary | ICD-10-CM | POA: Diagnosis not present

## 2016-12-03 DIAGNOSIS — N2581 Secondary hyperparathyroidism of renal origin: Secondary | ICD-10-CM | POA: Diagnosis not present

## 2016-12-03 DIAGNOSIS — E119 Type 2 diabetes mellitus without complications: Secondary | ICD-10-CM

## 2016-12-03 DIAGNOSIS — I4891 Unspecified atrial fibrillation: Secondary | ICD-10-CM

## 2016-12-03 DIAGNOSIS — R001 Bradycardia, unspecified: Secondary | ICD-10-CM

## 2016-12-03 DIAGNOSIS — N289 Disorder of kidney and ureter, unspecified: Secondary | ICD-10-CM

## 2016-12-03 DIAGNOSIS — Z7189 Other specified counseling: Secondary | ICD-10-CM

## 2016-12-03 DIAGNOSIS — D631 Anemia in chronic kidney disease: Secondary | ICD-10-CM | POA: Diagnosis not present

## 2016-12-03 DIAGNOSIS — N1832 Chronic kidney disease, stage 3b: Secondary | ICD-10-CM

## 2016-12-03 DIAGNOSIS — I5032 Chronic diastolic (congestive) heart failure: Secondary | ICD-10-CM

## 2016-12-03 DIAGNOSIS — E1129 Type 2 diabetes mellitus with other diabetic kidney complication: Secondary | ICD-10-CM | POA: Diagnosis not present

## 2016-12-03 DIAGNOSIS — N184 Chronic kidney disease, stage 4 (severe): Secondary | ICD-10-CM | POA: Diagnosis not present

## 2016-12-03 DIAGNOSIS — M109 Gout, unspecified: Secondary | ICD-10-CM | POA: Diagnosis not present

## 2016-12-03 DIAGNOSIS — E785 Hyperlipidemia, unspecified: Secondary | ICD-10-CM | POA: Diagnosis not present

## 2016-12-03 DIAGNOSIS — I509 Heart failure, unspecified: Secondary | ICD-10-CM | POA: Diagnosis not present

## 2016-12-03 LAB — POCT INR: INR: 2.5

## 2016-12-03 NOTE — Progress Notes (Signed)
Location:   [place]   Place of Service:   Clinic  Provider: Keasha Malkiewicz L. Mariea Clonts, D.O., C.M.D.  Code Status: DNR, reviewed code status today with patient and his wife in advance care planning.  They have discussed this in the past and both are quite clear that Omar Lambert would not want CPR in the event his heart stopped or he stopped breathing.  He does not want to be kept alive on machines.  They have strong faith in God and believe that that would mean it was time to "go home."      Goals of Care:  Advanced Directives 12/08/2016  Does Patient Have a Medical Advance Directive? Yes  Type of Advance Directive Out of facility DNR (pink MOST or yellow form)  Copy of [place] in Chart? -  Would patient like information on creating a medical advance directive? -  Pre-existing out of facility DNR order (yellow form or pink MOST form) Yellow form placed in chart (order not valid for inpatient use)     Chief Complaint  Patient presents with  . Hospitalization Follow-up    10/27/2016 - 11/09/2016 (13 days)    HPI: Patient is a 76 y.o. male seen today for hospital f/u and medical mgt of his chronic diseases.  He was discharged several weeks ago and has already seen cardiology since, but did not go to coumadin clinic as was advised at discharge (not clear what happened there).  Has since gotten his INR checked which miraculously was perfect at 2.5, fortunately.    Was admitted to the hospital on 9/22 after he passed out.  Had eye surgery on 9/21 and did not wake well after anesthesia. His wife took him to the ED that night. Was diagnosed with acute kidney injury and was on CRRT for a few days. Creatinine at discharge was 4. Says that he is making urine now. Saw a nephrologist today and was told everything was OK (I don't have this note this soon and wonder how his labs from today will be).  The nephrologist does not anticipate hemodialysis any time soon--he would be a poor candidate with  his multimorbidty and nonadherence to medical regimen. Was septic according to his wife, but records do not indicate this.  Was previously on Eliquis but was started on warfarin in the hospital. Was seen in the Coumadin clinic today and no changes were made. Currently on Warfarin 5mg  daily. Says he has had no bleeding anywhere. May switch back to elliquis at some point but not sure when.   Atrial Fibrillation with slow response- Cardiology Dr. Autumn Messing.  On coumadin for prophylaxis. Amiodarone was stopped after hospitalization due to bradycardia.  CHF- takes torsemide 20mg  twice daily for fluid retention. Says he does not weigh himself daily but is down to 208lbs from 220lbs when he was in the hospital. Wife reports he was down to 198 in hospital at one point.  Denies any shortness of breath or swelling in his ankles. He is eating a low sodium diet and following the fluid restriction, they report.  Diabetes- Managed well on tradjenta last A1C 5.6 in hospital on 9/23.   Hypothyroid- TSH was 2.561 in hospital. He continues on levothyroxine. It's finally at goal.    Hypertension- BP 120/60, On torsemide for fluid retention.  Not on other meds for BP due to bradycardia.  COPD- Denies SOB does not use his inhaler/nebs.  No wheezing.  Wants to go back to work soon,  works as a Sports coach, but it's very light duty per his wife.  He reports he needs to get out of the house b/c he's going stir-crazy.     Starts PT on Monday  Past Medical History:  Diagnosis Date  . Acute kidney injury superimposed on chronic kidney disease ([place]) 11/2016  . Acute respiratory failure ([place]) 11/2016  . Allergy   . Asthma   . Atrial fibrillation ([place])   . Atrophic gastritis   . Benign essential hypertension   . CHF (congestive heart failure) ([place])   . COPD (chronic obstructive pulmonary disease) ([place])   . Diabetes ([place])   . Diabetes mellitus without complication ([place])   . GERD (gastroesophageal reflux disease)    . Gout attack 09/2012  . Sinusitis, maxillary, chronic   . Thyroid disease   . Type II or unspecified type diabetes mellitus without mention of complication, not stated as uncontrolled     Past Surgical History:  Procedure Laterality Date  . COLONOSCOPY  2003  . EYE SURGERY    . HERNIA REPAIR  1980    No Known Allergies  Outpatient Encounter Prescriptions as of 12/03/2016  Medication Sig  . acetaminophen (TYLENOL) 325 MG tablet Take 650 mg by mouth every 6 (six) hours as needed for mild pain.  Marland Kitchen albuterol (PROVENTIL) (2.5 MG/3ML) 0.083% nebulizer solution INHALE CONTENTS OF 1 VIAL IN NEBULIZER EVERY 4 HOURS AS NEEDED FOR WHEEZING AND ASTHMA  . allopurinol (ZYLOPRIM) 300 MG tablet Take 300 mg by mouth daily.  . bacitracin ophthalmic ointment Place 1 application into the right eye 2 (two) times daily.  . fluticasone (FLONASE) 50 MCG/ACT nasal spray PLACE 2 SPRAYS INTO BOTH NOSTRILS DAILY.  Marland Kitchen KLOR-CON M20 20 MEQ tablet TAKE 1 TABLET BY MOUTH EVERY DAY  . levothyroxine (SYNTHROID, LEVOTHROID) 175 MCG tablet Take 175 mcg by mouth daily before breakfast.  . linagliptin (TRADJENTA) 5 MG TABS tablet Take 1 tablet (5 mg total) by mouth daily.  . montelukast (SINGULAIR) 10 MG tablet Take one tablet by mouth at bedtime as needed for allergies and asthma  . Multiple Vitamins-Minerals (CENTRUM SILVER ADULT 50+ PO) Take 1 tablet by mouth daily.  . pantoprazole (PROTONIX) 40 MG tablet TAKE 1 TABLET BY MOUTH EVERY DAY BEFORE SUPPER  . pravastatin (PRAVACHOL) 20 MG tablet Take 20 mg by mouth daily.  . silver sulfADIAZINE (SILVADENE) 1 % cream   . torsemide (DEMADEX) 20 MG tablet TAKE 1 TABLET TWICE A DAY  . warfarin (COUMADIN) 5 MG tablet Take 1 tablet (5 mg total) by mouth one time only at 6 PM. This dose will need to be adjusted based on INR results  . [DISCONTINUED] fluticasone (FLONASE) 50 MCG/ACT nasal spray Place 1 spray into both nostrils 2 (two) times daily.  . [DISCONTINUED] linagliptin  (TRADJENTA) 5 MG TABS tablet Take 5 mg by mouth daily.   No facility-administered encounter medications on file as of 12/03/2016.     Review of Systems:  Review of Systems  Constitutional: Positive for weight loss. Negative for chills and fever.  HENT: Negative for congestion.   Eyes: Negative for blurred vision.  Respiratory: Negative for cough, shortness of breath and wheezing.   Cardiovascular: Positive for palpitations. Negative for chest pain and leg swelling.  Gastrointestinal: Positive for constipation and diarrhea.  Genitourinary: Negative for dysuria, frequency and urgency.  Musculoskeletal: Negative for back pain and joint pain.  Skin: Negative for itching and rash.  Neurological: Positive for loss of consciousness and weakness. Negative  for dizziness and headaches.       Syncope took him to hospital  Endo/Heme/Allergies: Negative for polydipsia.  Psychiatric/Behavioral: The patient is not nervous/anxious and does not have insomnia.     Health Maintenance  Topic Date Due  . URINE MICROALBUMIN  05/29/2016  . OPHTHALMOLOGY EXAM  12/12/2016  . FOOT EXAM  03/02/2017  . HEMOGLOBIN A1C  04/27/2017  . TETANUS/TDAP  02/05/2021  . INFLUENZA VACCINE  Completed  . PNA vac Low Risk Adult  Completed    Physical Exam: Vitals:   12/03/16 1425  BP: 120/60  Pulse: 69  Temp: 97.7 F (36.5 C)  TempSrc: Oral  SpO2: 94%  Weight: 208 lb (94.3 kg)   Body mass index is 34.61 kg/m. Physical Exam  Constitutional: He is oriented to person, place, and time. He appears well-developed. No distress.  Chronically ill appearing   Cardiovascular: Normal rate, regular rhythm and normal heart sounds.   Pulmonary/Chest: Effort normal and breath sounds normal. No respiratory distress. He has no wheezes. He has no rales.  Abdominal: Soft. Bowel sounds are normal.  Musculoskeletal: Normal range of motion.  Neurological: He is alert and oriented to person, place, and time.  Slow to answer  which is not new  Skin: Skin is warm and dry. Capillary refill takes less than 2 seconds.  Psychiatric: He has a normal mood and affect. His behavior is normal. Judgment and thought content normal.    Labs reviewed: Basic Metabolic Panel:  Recent Labs  03/28/16 1015  07/20/16 0935  10/31/16 1355  11/05/16 0356 11/06/16 0319 11/07/16 0313 11/08/16 0423 11/09/16 0621  NA 141  < > 144  < >  --   < > 147* 142 144 141 137  K 4.4  < > 4.1  < >  --   < > 3.7 3.7 4.0 3.6 3.6  CL 97*  < > 101  < >  --   < > 109 106 106 100* 103  CO2 35*  < > 27  < >  --   < > 25 27 25 26 24   GLUCOSE 78  < > 111*  < >  --   < > 101* 99 90 88 71  BUN 30*  < > 59*  < >  --   < > 104* 55* 68* 63* 61*  CREATININE 2.19*  < > 3.43*  < >  --   < > 5.36* 3.59* 4.12* 4.08* 4.31*  CALCIUM 8.6  < > 8.8  < >  --   < > 8.9 8.7* 9.0 8.6* 8.8*  MG  --   --   --   < >  --   < > 2.7* 2.2 2.4  --   --   PHOS  --   --   --   < >  --   < > 6.9* 5.0* 7.3* 5.0* 4.6  TSH 5.71*  --  4.00  --  2.561  --   --   --   --   --   --   < > = values in this interval not displayed. Liver Function Tests:  Recent Labs  07/20/16 0935 10/27/16 0025  11/06/16 0319 11/07/16 0313 11/09/16 0621  AST 29 361*  --  72*  --   --   ALT 25 290*  --  183*  --   --   ALKPHOS 163* 169*  --  185*  --   --   BILITOT  0.9 1.9*  --  2.4*  --   --   PROT 6.6 6.9  --  6.9  --   --   ALBUMIN 4.0 3.4*  < > 2.6*  2.6* 2.7* 2.8*  < > = values in this interval not displayed. No results for input(s): LIPASE, AMYLASE in the last 8760 hours. No results for input(s): AMMONIA in the last 8760 hours. CBC:  Recent Labs  11/01/16 0341 11/02/16 0251 11/03/16 0512  11/06/16 0319 11/07/16 0313 11/08/16 0423  WBC 5.4 5.9 7.6  < > 11.1* 10.8* 9.5  NEUTROABS 3.9 4.5 5.6  --   --   --   --   HGB 11.7* 11.6* 11.5*  < > 10.5* 11.5* 11.3*  HCT 35.0* 35.6* 35.1*  < > 33.3* 35.4* 35.3*  MCV 92.3 94.4 93.9  < > 96.5 97.5 96.7  PLT 152 144* 171  < > 256 284 359    < > = values in this interval not displayed. Lipid Panel:  Recent Labs  03/01/16 0819 03/28/16 1015 11/06/16 0319  CHOL 161 162  --   HDL 69 84  --   LDLCALC 80 70  --   TRIG 62 41 88  CHOLHDL 2.3 1.9  --    Lab Results  Component Value Date   HGBA1C 5.6 10/28/2016    Assessment/Plan 1. Chronic kidney disease (CKD) stage G3b/A2, moderately decreased glomerular filtration rate (GFR) between 30-44 mL/min/1.73 square meter and albuminuria creatinine ratio between 30-299 mg/g (HCC) Follow up with nephology, need latest creatinine from Dr. Deterding's note today  2. DM type 2, goal HbA1c < 7% (HCC) A1C 5.6 Continue current Tradjenta, renal function prohibits many alternatives, cont to work on healthy diet and exercise  3. Essential hypertension, benign Continue off bp meds, is within goal  4. Chronic diastolic CHF (congestive heart failure) (HCC) Continue torsemide and weigh daily, weight up slightly from discharge, but no signs of acute volume overload, keep regular f/u with Dr. Claiborne Billings, et al Has historically been nonadherent with diet and exercise and medications  5. Atrial fibrillation with slow ventricular response (HCC) Regular rhythm today purely by exam and pulse check. Continue to follow with cardiology.  Off amiodarone due to bradycardia and syncope.    6. COPD GOLD II Not having any issue at this time and not using any inhalers.  Remains on singulair orally and has prn albuterol nebs.  Nonadherent.   Also does not seem highly educated--has been taught several times about need for regular use of inhaler despite absence of symptoms and then albuterol prn and albuterol nebs prn when unable to use inhaler.  Apparently all of these also came off of his list during hospitalization and other visits--only albuterol nebs remain.  7. Hypothyroidism, unspecified type Continue on levothyroxine Check TSH in 1 year.  Finally stable.  8.  Blood clotting disorder Due to afib, on  warfarin and being managed by cardiology coumadin clinic--importance of f/u with them emphasized today  9.  Advance care planning -encouraged HCPOA/living will completion, form provided and asked that we receive a copy when completed and notarized -reviewed code status with is DNR--see top of note for details -18 mins spent reviewing this with him and his wife and an additional 25 mins was spent on med mgt/review of hospital records and cardiology notes  Labs/tests ordered:  No orders of the defined types were placed in this encounter.  Next appt:  12/10/2016 med mgt--keep as scheduled  Ndia Sampath L.  Sharonlee Nine, D.O. [facility] [address], [place] [address] Cell Phone (Mon-Fri 8am-5pm):  [phone] On Call:  [phone] & follow prompts after 5pm & weekends Office Phone:  [phone] Office Fax:  [phone]

## 2016-12-03 NOTE — Patient Instructions (Addendum)
Medication Instructions:  STOP- Eliquis and Amiodarone  If you need a refill on your cardiac medications before your next appointment, please call your pharmacy.  Labwork: None Ordered   Testing/Procedures: Your physician has recommended that you wear an event monitor for 30 days. Event monitors are medical devices that record the heart's electrical activity. Doctors most often [place] these monitors to diagnose arrhythmias. Arrhythmias are problems with the speed or rhythm of the heartbeat. The monitor is a small, portable device. You can wear one while you do your normal daily activities. This is usually used to diagnose what is causing palpitations/syncope (passing out).   Follow-Up: Your physician wants you to follow-up in: 2 Months with [name].    Thank you for choosing [facility] at [facility]!!

## 2016-12-03 NOTE — Progress Notes (Signed)
Cardiology Office Note    Date:  12/05/2016   ID:  Omar, Lambert Sep 07, 1940, MRN [id number]  PCP:  Gayland Curry, DO  Cardiologist:  Dr. Claiborne Billings  Primary nephrologist: Dr. Jimmy Footman   Chief Complaint  Patient presents with  . Follow-up    post hospital. Seen for Dr. Claiborne Billings    History of Present Illness:  Omar Lambert is a 76 y.o. male with PMH of severe COPD, DM II, CKD, HTN, OSA, hypothyroidism and atrial flutter. He has a history of lower extremity venous insufficiency. Previous venous Doppler did not show any evidence of thrombosis or thrombophlebitis, but he had bilateral deep venous insufficiency within the femoral and popliteal vein. Last echocardiogram obtained on 08/18/2015 showed EF 60-65%, moderate TR, PA peak pressure 40 mmHg. He was previously on Xarelto for atrial flutter, this has later transitioned to eliquis 5 mg twice a day for blood in the stool. His aspirin was discontinued around the same time. He was admitted to [facility] [address] in March 2018 with worsening shortness breath, orthopnea and weight gain. He was treated with IV Lasix and diuresed 7 pounds. He was transitioned to torsemide 20 mg twice a day. He was also empirically treated for possible CPAP. He was last seen by Dr. Claiborne Billings on 04/26/2016, he was doing well at the time.  I recently saw the patient on 10/17/2016 for chest pain, outpatient Lexiscan Myoview obtained on 10/23/2016 showed EF 47%, low risk study with normal perfusion and the mildly reduced LV function. He underwent eye procedure on 10/27/2016, after he went home he gradually became lethargic and around 10 PM found to be unresponsive by his wife. On EMS arrival, he was noted to be in atrial fibrillation with slow ventricular response. He was on amiodarone but no other AV nodal blocking agent as outpatient. He was also noted to be volume overloaded requiring intubation. He received IV diuresis. He is volume overloaded and was felt to be related to  worsening renal function. He did have elevated troponin, this was felt secondary to cardiogenic shock and acute on chronic renal failure. Outpatient 30 day event monitor was recommended after discharge to monitor for significant bradycardia. During this admission, he had a temporary femoral catheter placed and underwent hemodialysis on 11/05/2016. His creatinine remained elevated in the 4 range prior to discharge. His previous eliquis has been switched to Coumadin until his renal function Improved.  He presents today to cardiology office visit along with his wife. He has been feeling well since discharge. He is making urine by this point. On physical exam, he continued to have mildly diminished breath sound in bilateral bases, but no obvious crackle, wheezing or rhonchi. He is still taking amiodarone, EKG showed a first-degree AV block with sinus rhythm. I will discontinue his amiodarone as recommended by Dr. Debara Pickett during the hospitalization. Although eliquis is listed on the medication list, he is not taking it anymore and has been transitioned to Coumadin. He checked his Coumadin in early October, however failed to follow-up on this. We have asked our clinical pharmacist to check his Coumadin level. He will need a 30 day monitor to check for recurrence of atrial fibrillation or bradycardiac spell.     Past Medical History:  Diagnosis Date  . Acute kidney injury superimposed on chronic kidney disease ([place]) 11/2016  . Acute respiratory failure ([place]) 11/2016  . Allergy   . Asthma   . Atrial fibrillation ([name])   . Atrophic gastritis   .  Benign essential hypertension   . CHF (congestive heart failure) ([place])   . COPD (chronic obstructive pulmonary disease) ([place])   . Diabetes ([name])   . Diabetes mellitus without complication ([name])   . GERD (gastroesophageal reflux disease)   . Gout attack 09/2012  . Sinusitis, maxillary, chronic   . Thyroid disease   . Type II or unspecified type diabetes mellitus  without mention of complication, not stated as uncontrolled     Past Surgical History:  Procedure Laterality Date  . COLONOSCOPY  2003  . EYE SURGERY    . HERNIA REPAIR  1980    Current Medications: Outpatient Medications Prior to Visit  Medication Sig Dispense Refill  . acetaminophen (TYLENOL) 325 MG tablet Take 650 mg by mouth every 6 (six) hours as needed for mild pain.    Marland Kitchen albuterol (PROVENTIL) (2.5 MG/3ML) 0.083% nebulizer solution INHALE CONTENTS OF 1 VIAL IN NEBULIZER EVERY 4 HOURS AS NEEDED FOR WHEEZING AND ASTHMA 75 mL 5  . allopurinol (ZYLOPRIM) 300 MG tablet Take 300 mg by mouth daily.    . bacitracin ophthalmic ointment Place 1 application into the right eye 2 (two) times daily.    . fluticasone (FLONASE) 50 MCG/ACT nasal spray PLACE 2 SPRAYS INTO BOTH NOSTRILS DAILY. 16 g 3  . KLOR-CON M20 20 MEQ tablet TAKE 1 TABLET BY MOUTH EVERY DAY 30 tablet 1  . levothyroxine (SYNTHROID, LEVOTHROID) 175 MCG tablet Take 175 mcg by mouth daily before breakfast.    . linagliptin (TRADJENTA) 5 MG TABS tablet Take 1 tablet (5 mg total) by mouth daily. 90 tablet 3  . montelukast (SINGULAIR) 10 MG tablet Take one tablet by mouth at bedtime as needed for allergies and asthma 90 tablet 0  . Multiple Vitamins-Minerals (CENTRUM SILVER ADULT 50+ PO) Take 1 tablet by mouth daily.    . pantoprazole (PROTONIX) 40 MG tablet TAKE 1 TABLET BY MOUTH EVERY DAY BEFORE SUPPER 30 tablet 6  . pravastatin (PRAVACHOL) 20 MG tablet Take 20 mg by mouth daily.    . silver sulfADIAZINE (SILVADENE) 1 % cream     . torsemide (DEMADEX) 20 MG tablet TAKE 1 TABLET TWICE A DAY 60 tablet 3  . warfarin (COUMADIN) 5 MG tablet Take 1 tablet (5 mg total) by mouth one time only at 6 PM. This dose will need to be adjusted based on INR results 30 tablet 0  . amiodarone (PACERONE) 200 MG tablet TAKE 1 TABLET TWICE A DAY 60 tablet 11  . apixaban (ELIQUIS) 5 MG TABS tablet Take 1 tablet (5 mg total) by mouth 2 (two) times daily. 60  tablet 1  . fluticasone (FLONASE) 50 MCG/ACT nasal spray Place 1 spray into both nostrils 2 (two) times daily.    Marland Kitchen linagliptin (TRADJENTA) 5 MG TABS tablet Take 5 mg by mouth daily.    Marland Kitchen acetaminophen (TYLENOL) 325 MG tablet Take 650 mg by mouth every 6 (six) hours as needed for mild pain.    Marland Kitchen allopurinol (ZYLOPRIM) 300 MG tablet Take 1 tablet by mouth daily as needed.    Marland Kitchen levothyroxine (SYNTHROID, LEVOTHROID) 175 MCG tablet TAKE 1 TABLET EVERY DAY BEFORE BREAKFAST (Patient not taking: Reported on 12/03/2016) 30 tablet 3  . pantoprazole (PROTONIX) 40 MG tablet Take 40 mg by mouth daily.    . pravastatin (PRAVACHOL) 20 MG tablet TAKE 1 TABLET BY MOUTH EVERY DAY WITH SUPPER (Patient not taking: Reported on 12/03/2016) 30 tablet 3   No facility-administered medications prior to visit.  Allergies:   Patient has no known allergies.   Social History   Social History  . Marital status: Married    Spouse name: N/A  . Number of children: 3  . Years of education: N/A   Occupational History  . Retired    Social History Main Topics  . Smoking status: Former Smoker    Packs/day: 1.00    Years: 20.00    Types: Cigarettes  . Smokeless tobacco: Never Used     Comment: QUIT SMOKING IN 2000  . Alcohol use No  . Drug use: No  . Sexual activity: Not Currently   Other Topics Concern  . None   Social History Narrative   ** Merged History Encounter **       Lives in 1 story apt with his wife, Berdell Hostetler.  Sometimes exercises.  Drinks coffee.       Family History:  The patient's family history includes COPD in his sister and sister; Cancer in his sister; Diabetes in his sister; Stroke in his mother.   ROS:   Please see the history of present illness.    ROS All other systems reviewed and are negative.   PHYSICAL EXAM:   VS:  BP 130/64   Pulse 72   Ht 5\' 5"  (1.651 m)   Wt 220 lb 12.8 oz (100.2 kg)   SpO2 95%   BMI 36.74 kg/m    GEN: Well nourished, well developed, in no  acute distress  HEENT: normal  Neck: no JVD, carotid bruits, or masses Cardiac: RRR; no murmurs, rubs, or gallops,no edema  Respiratory:  Mildly diminished breath sounds in bilateral bases GI: soft, nontender, nondistended, + BS MS: no deformity or atrophy  Skin: warm and dry, no rash Neuro:  Alert and Oriented x 3, Strength and sensation are intact Psych: euthymic mood, full affect  Wt Readings from Last 3 Encounters:  12/03/16 208 lb (94.3 kg)  12/03/16 220 lb 12.8 oz (100.2 kg)  11/08/16 198 lb 12.8 oz (90.2 kg)      Studies/Labs Reviewed:   EKG:  EKG is ordered today.  The ekg ordered today demonstrates Normal sinus rhythm, poor progression anterior lead, first degree AV block.  Recent Labs: 04/09/2016: B Natriuretic Peptide 460.5 10/31/2016: TSH 2.561 11/06/2016: ALT 183 11/07/2016: Magnesium 2.4 11/08/2016: Hemoglobin 11.3; Platelets 359 11/09/2016: BUN 61; Creatinine, Ser 4.31; Potassium 3.6; Sodium 137   Lipid Panel    Component Value Date/Time   CHOL 162 03/28/2016 1015   CHOL 151 05/25/2015 0950   TRIG 88 11/06/2016 0319   HDL 84 03/28/2016 1015   HDL 68 05/25/2015 0950   CHOLHDL 1.9 03/28/2016 1015   VLDL 8 03/28/2016 1015   LDLCALC 70 03/28/2016 1015   LDLCALC 68 05/25/2015 0950    Additional studies/ records that were reviewed today include:   Myoview 10/23/2016 Study Highlights     There was no ST segment deviation noted during stress.  The study is normal.  This is a low risk study.  The left ventricular ejection fraction is mildly decreased (45-54%).  Nuclear stress EF: 47%.   Low risk stress nuclear study with normal perfusion and mildly reduced left ventricularsystolic function. Prominent paradoxical septal motion, consider right heart overload. Suggest corroboration with echo.      Echo 10/27/2016 LV EF: 65% -   70%  ------------------------------------------------------------------- Indications:      Abnormal EKG  794.31.  ------------------------------------------------------------------- History:   Risk factors:  Diabetes mellitus.  ------------------------------------------------------------------- Study Conclusions  -  Left ventricle: The cavity size was normal. Wall thickness was   increased in a pattern of moderate LVH. Systolic function was   vigorous. The estimated ejection fraction was in the range of 65%   to 70%. Wall motion was normal; there were no regional wall   motion abnormalities. - Ventricular septum: The contour showed diastolic flattening. - Right ventricle: The cavity size was moderately dilated. - Right atrium: The atrium was moderately dilated. - Tricuspid valve: There was severe regurgitation. - Pulmonary arteries: Systolic pressure was mildly to moderately   increased. PA peak pressure: 38 mm Hg (S).   ASSESSMENT:    1. Bradycardia   2. Atrial fibrillation with slow ventricular response ([place])   3. Chronic obstructive pulmonary disease, unspecified COPD type ([place])   4. Essential hypertension   5. Controlled type 2 diabetes mellitus without complication, without long-term current use of insulin ([place])   6. Acute on chronic renal insufficiency      PLAN:  In order of problems listed above:  1. Atrial fibrillation with slow ventricular response: Currently maintaining sinus rhythm. As recommended by Dr. Debara Pickett during recent hospitalization, we will discontinue amiodarone. He appears to be in sinus rhythm with first-degree AV block on today's EKG. He is off of all AV nodal blocking agent as well. Will arrange 30 day event monitor as outpatient. He has not checked Coumadin level for the past 3 weeks, I have asked our clinical pharmacist to check it today.  2. Chronic diastolic heart failure: Euvolemic on physical exam. Fluid accumulation related to renal issue. He is making urine again, on torsemide 20 mg twice a day  3. Acute on chronic renal insufficiency  insufficiency: Require temporary dialysis via femoral dialysis catheter during the recent hospitalization. Followed by nephrology as outpatient  4. Hypertension: Blood pressure stable on current medication  5. DM 2: Managed by primary care provider  6. Hypothyroidism: Will defer to primary care provider.  7. Severe COPD: No acute exacerbation based on physical exam    Medication Adjustments/Labs and Tests Ordered: Current medicines are reviewed at length with the patient today.  Concerns regarding medicines are outlined above.  Medication changes, Labs and Tests ordered today are listed in the Patient Instructions below. Patient Instructions  Medication Instructions:  STOP- Eliquis and Amiodarone  If you need a refill on your cardiac medications before your next appointment, please call your pharmacy.  Labwork: None Ordered   Testing/Procedures: Your physician has recommended that you wear an event monitor for 30 days. Event monitors are medical devices that record the heart's electrical activity. Doctors most often [place] these monitors to diagnose arrhythmias. Arrhythmias are problems with the speed or rhythm of the heartbeat. The monitor is a small, portable device. You can wear one while you do your normal daily activities. This is usually used to diagnose what is causing palpitations/syncope (passing out).   Follow-Up: Your physician wants you to follow-up in: 2 Months with [name].    Thank you for choosing [facility], [place], [place]  12/05/2016 5:38 AM    [name], [place], [place]  [address] Phone: [phone]; Fax: [phone]

## 2016-12-04 DIAGNOSIS — H16211 Exposure keratoconjunctivitis, right eye: Secondary | ICD-10-CM | POA: Diagnosis not present

## 2016-12-04 DIAGNOSIS — S01111S Laceration without foreign body of right eyelid and periocular area, sequela: Secondary | ICD-10-CM | POA: Diagnosis not present

## 2016-12-04 DIAGNOSIS — H11131 Conjunctival pigmentations, right eye: Secondary | ICD-10-CM | POA: Diagnosis not present

## 2016-12-04 DIAGNOSIS — D3101 Benign neoplasm of right conjunctiva: Secondary | ICD-10-CM | POA: Diagnosis not present

## 2016-12-04 DIAGNOSIS — S0181XA Laceration without foreign body of other part of head, initial encounter: Secondary | ICD-10-CM | POA: Diagnosis not present

## 2016-12-05 ENCOUNTER — Encounter: Payer: Self-pay | Admitting: Physician Assistant

## 2016-12-05 DIAGNOSIS — G4733 Obstructive sleep apnea (adult) (pediatric): Secondary | ICD-10-CM | POA: Diagnosis not present

## 2016-12-07 DIAGNOSIS — I4891 Unspecified atrial fibrillation: Secondary | ICD-10-CM | POA: Diagnosis not present

## 2016-12-07 DIAGNOSIS — Z5181 Encounter for therapeutic drug level monitoring: Secondary | ICD-10-CM | POA: Diagnosis not present

## 2016-12-07 DIAGNOSIS — I5032 Chronic diastolic (congestive) heart failure: Secondary | ICD-10-CM | POA: Diagnosis not present

## 2016-12-07 DIAGNOSIS — N189 Chronic kidney disease, unspecified: Secondary | ICD-10-CM | POA: Diagnosis not present

## 2016-12-07 DIAGNOSIS — R531 Weakness: Secondary | ICD-10-CM | POA: Diagnosis not present

## 2016-12-07 DIAGNOSIS — Z9181 History of falling: Secondary | ICD-10-CM | POA: Diagnosis not present

## 2016-12-07 DIAGNOSIS — Z7901 Long term (current) use of anticoagulants: Secondary | ICD-10-CM | POA: Diagnosis not present

## 2016-12-07 DIAGNOSIS — M1990 Unspecified osteoarthritis, unspecified site: Secondary | ICD-10-CM | POA: Diagnosis not present

## 2016-12-10 ENCOUNTER — Ambulatory Visit (INDEPENDENT_AMBULATORY_CARE_PROVIDER_SITE_OTHER): Payer: Medicare HMO | Admitting: Pharmacist Clinician (PhC)/ Clinical Pharmacy Specialist

## 2016-12-10 ENCOUNTER — Ambulatory Visit: Payer: Medicare HMO | Admitting: Internal Medicine

## 2016-12-10 DIAGNOSIS — Z7901 Long term (current) use of anticoagulants: Secondary | ICD-10-CM | POA: Diagnosis not present

## 2016-12-10 DIAGNOSIS — I4891 Unspecified atrial fibrillation: Secondary | ICD-10-CM

## 2016-12-10 LAB — PROTIME-INR
INR: 1.2 (ref 0.8–1.2)
Prothrombin Time: 12.1 s — ABNORMAL HIGH (ref 9.1–12.0)

## 2016-12-11 ENCOUNTER — Telehealth: Payer: Self-pay | Admitting: Cardiovascular Disease

## 2016-12-11 NOTE — Telephone Encounter (Signed)
Returned call to Kreig Parson (wife)(DPR)-pt wife notified. Went over scholed appts with Percy Winterrowd (wife)

## 2016-12-11 NOTE — Telephone Encounter (Signed)
New Message  Pt wife call requesting to speak with RN. She states pt does not know the name of the medication that he was instructed to take 2 tablets today and one tablet to morrow. Please call back to discuss

## 2016-12-17 ENCOUNTER — Telehealth: Payer: Self-pay | Admitting: *Deleted

## 2016-12-17 ENCOUNTER — Telehealth: Payer: Self-pay | Admitting: Cardiovascular Disease

## 2016-12-17 NOTE — Telephone Encounter (Signed)
New Message      *STAT* If patient is at the pharmacy, call can be transferred to refill team.   1. Which medications need to be refilled? (please list name of each medication and dose if known)   warfarin (COUMADIN) 5 MG tablet Take 1 tablet (5 mg total) by mouth one time only at 6 PM. This dose will need to be adjusted based on INR results     2. Which pharmacy/location (including street and city if local pharmacy) is medication to be sent to  [facility]  3. Do they need a 30 day or 90 day supply? [place]

## 2016-12-17 NOTE — Telephone Encounter (Signed)
[facility] calling asking for verbal ok to discharge from physical therapy.

## 2016-12-18 ENCOUNTER — Ambulatory Visit (INDEPENDENT_AMBULATORY_CARE_PROVIDER_SITE_OTHER): Payer: Medicare HMO

## 2016-12-18 DIAGNOSIS — R001 Bradycardia, unspecified: Secondary | ICD-10-CM

## 2016-12-18 DIAGNOSIS — I4891 Unspecified atrial fibrillation: Secondary | ICD-10-CM

## 2016-12-18 MED ORDER — WARFARIN SODIUM 5 MG PO TABS: ORAL_TABLET | ORAL | 1 refills | Status: DC

## 2016-12-18 NOTE — Telephone Encounter (Signed)
F/U Call:  Patient wife calling about warfarin refill. Patient is currently out of the medication.  Patient wife requesting a f/u call on whether prescription was sent to pharmacy.

## 2016-12-24 ENCOUNTER — Ambulatory Visit (INDEPENDENT_AMBULATORY_CARE_PROVIDER_SITE_OTHER): Payer: Medicare HMO | Admitting: Pharmacist Clinician (PhC)/ Clinical Pharmacy Specialist

## 2016-12-24 DIAGNOSIS — Z7901 Long term (current) use of anticoagulants: Secondary | ICD-10-CM | POA: Diagnosis not present

## 2016-12-24 DIAGNOSIS — I4891 Unspecified atrial fibrillation: Secondary | ICD-10-CM | POA: Diagnosis not present

## 2016-12-24 LAB — POCT INR: INR: 5

## 2016-12-31 ENCOUNTER — Ambulatory Visit (INDEPENDENT_AMBULATORY_CARE_PROVIDER_SITE_OTHER): Payer: Medicare HMO | Admitting: Pharmacist

## 2016-12-31 DIAGNOSIS — I4891 Unspecified atrial fibrillation: Secondary | ICD-10-CM

## 2016-12-31 DIAGNOSIS — Z7901 Long term (current) use of anticoagulants: Secondary | ICD-10-CM

## 2016-12-31 LAB — POCT INR: INR: 3.9

## 2017-01-09 ENCOUNTER — Ambulatory Visit (INDEPENDENT_AMBULATORY_CARE_PROVIDER_SITE_OTHER): Payer: Medicare HMO | Admitting: Pharmacist

## 2017-01-09 DIAGNOSIS — I4891 Unspecified atrial fibrillation: Secondary | ICD-10-CM | POA: Diagnosis not present

## 2017-01-09 DIAGNOSIS — Z7901 Long term (current) use of anticoagulants: Secondary | ICD-10-CM

## 2017-01-09 LAB — POCT INR: INR: 3.3

## 2017-01-09 NOTE — Patient Instructions (Signed)
Decrease dose to 1 tablet daily except for 1/2 tablet every Wednesday.  Repeat INR in 3 weeks

## 2017-01-30 ENCOUNTER — Ambulatory Visit (INDEPENDENT_AMBULATORY_CARE_PROVIDER_SITE_OTHER): Payer: Medicare HMO | Admitting: Pharmacist Clinician (PhC)/ Clinical Pharmacy Specialist

## 2017-01-30 DIAGNOSIS — Z7901 Long term (current) use of anticoagulants: Secondary | ICD-10-CM | POA: Diagnosis not present

## 2017-01-30 DIAGNOSIS — I4891 Unspecified atrial fibrillation: Secondary | ICD-10-CM | POA: Diagnosis not present

## 2017-01-30 LAB — POCT INR: INR: 5.7

## 2017-01-31 ENCOUNTER — Encounter: Payer: Self-pay | Admitting: *Deleted

## 2017-02-07 DIAGNOSIS — E119 Type 2 diabetes mellitus without complications: Secondary | ICD-10-CM | POA: Diagnosis not present

## 2017-02-07 DIAGNOSIS — H04123 Dry eye syndrome of bilateral lacrimal glands: Secondary | ICD-10-CM | POA: Diagnosis not present

## 2017-02-07 DIAGNOSIS — H2512 Age-related nuclear cataract, left eye: Secondary | ICD-10-CM | POA: Diagnosis not present

## 2017-02-07 DIAGNOSIS — Z961 Presence of intraocular lens: Secondary | ICD-10-CM | POA: Diagnosis not present

## 2017-02-07 DIAGNOSIS — H40013 Open angle with borderline findings, low risk, bilateral: Secondary | ICD-10-CM | POA: Diagnosis not present

## 2017-02-07 DIAGNOSIS — H10413 Chronic giant papillary conjunctivitis, bilateral: Secondary | ICD-10-CM | POA: Diagnosis not present

## 2017-02-07 LAB — HM DIABETES EYE EXAM

## 2017-02-11 ENCOUNTER — Ambulatory Visit (INDEPENDENT_AMBULATORY_CARE_PROVIDER_SITE_OTHER): Payer: Medicare HMO | Admitting: Pharmacist

## 2017-02-11 DIAGNOSIS — I4891 Unspecified atrial fibrillation: Secondary | ICD-10-CM

## 2017-02-11 DIAGNOSIS — Z7901 Long term (current) use of anticoagulants: Secondary | ICD-10-CM | POA: Diagnosis not present

## 2017-02-11 LAB — POCT INR: INR: 2.9

## 2017-02-14 ENCOUNTER — Encounter: Payer: Self-pay | Admitting: *Deleted

## 2017-02-18 ENCOUNTER — Ambulatory Visit: Payer: Medicare HMO | Admitting: Cardiovascular Disease

## 2017-02-18 ENCOUNTER — Encounter: Payer: Self-pay | Admitting: Cardiovascular Disease

## 2017-02-18 VITALS — BP 141/73 | HR 71 | Ht 65.0 in | Wt 221.2 lb

## 2017-02-18 DIAGNOSIS — E1169 Type 2 diabetes mellitus with other specified complication: Secondary | ICD-10-CM

## 2017-02-18 DIAGNOSIS — I5032 Chronic diastolic (congestive) heart failure: Secondary | ICD-10-CM

## 2017-02-18 DIAGNOSIS — E118 Type 2 diabetes mellitus with unspecified complications: Secondary | ICD-10-CM | POA: Diagnosis not present

## 2017-02-18 DIAGNOSIS — I481 Persistent atrial fibrillation: Secondary | ICD-10-CM | POA: Diagnosis not present

## 2017-02-18 DIAGNOSIS — I4892 Unspecified atrial flutter: Secondary | ICD-10-CM | POA: Diagnosis not present

## 2017-02-18 DIAGNOSIS — I480 Paroxysmal atrial fibrillation: Secondary | ICD-10-CM | POA: Diagnosis not present

## 2017-02-18 DIAGNOSIS — E785 Hyperlipidemia, unspecified: Secondary | ICD-10-CM | POA: Diagnosis not present

## 2017-02-18 DIAGNOSIS — I130 Hypertensive heart and chronic kidney disease with heart failure and stage 1 through stage 4 chronic kidney disease, or unspecified chronic kidney disease: Secondary | ICD-10-CM | POA: Diagnosis not present

## 2017-02-18 DIAGNOSIS — I10 Essential (primary) hypertension: Secondary | ICD-10-CM

## 2017-02-18 DIAGNOSIS — J449 Chronic obstructive pulmonary disease, unspecified: Secondary | ICD-10-CM | POA: Diagnosis not present

## 2017-02-18 DIAGNOSIS — N184 Chronic kidney disease, stage 4 (severe): Secondary | ICD-10-CM

## 2017-02-18 DIAGNOSIS — Z7901 Long term (current) use of anticoagulants: Secondary | ICD-10-CM

## 2017-02-18 DIAGNOSIS — E039 Hypothyroidism, unspecified: Secondary | ICD-10-CM | POA: Diagnosis not present

## 2017-02-18 NOTE — Patient Instructions (Signed)
Medication Instructions:  Your physician recommends that you continue on your current medications as directed. Please refer to the Current Medication list given to you today.  Labwork: Your physician recommends that you return for lab work in: [place]   Testing/Procedures: none  Follow-Up: Your physician recommends that you schedule a follow-up appointment in: 6 months with Dr. Claiborne Billings.   Any Other Special Instructions Will Be Listed Below (If Applicable).     If you need a refill on your cardiac medications before your next appointment, please call your pharmacy.

## 2017-02-18 NOTE — Progress Notes (Addendum)
Patient ID: Omar Lambert, male   DOB: Nov 27, 1940, 77 y.o.   MRN: [id number]     HPI:   Omar Lambert is a 77 year old African American male who has a history of severe COPD, diabetes mellitus, chronic kidney disease, hypertension, obstructive sleep apnea and atrial flutter.  He presents for a 78-monthfollow-up evaluation.    [name] a history of lower extremity venous insufficiency. A lower extremity venous Doppler study  did not show evidence for thrombus or thrombophlebitis but he had bilateral deep venous insufficiency within the femoral and popliteal veins. No venous insufficiency was noted in the right and left greater saphenous vein or small saphenous vein. A 2-D echo Doppler study on 12/17/2012 demonstrated normal systolic function with an ejection fraction of 55-60% as well as normal diastolic function. He had mild biatrial enlargement and mild dilatation of his right ventricle. There is mild pulmonary hypertension with a PA pressure 39 mm.  He has a history of obstructive sleep apnea.  After not having seen him in over 2 years when I recently saw him he was only using his CPAP at most 30% of the time.  He needed new supplies.  He admitted to experiencing some shortness of breath with activity.  He states he recently had a bad cold.  He continues to note leg swelling.  He is unaware of any rhythm issues.  He has a history of significant obesity.  The last several weeks he has been using a diet supplement Hydroxycut, of which there is significant amount of coffee extract in this preparation.   When I saw him, his ECG suggested atrial flutter with variable block.  At that time, I advised he discontinue his over-the-counter supplement.  With his elevated [id number]  score, I recommend initiation of anticoagulation.  Since he had had renal insufficiency with creatinine in April 2016 at1.72 and creatinine in July at 1.48,  I initially started him on Xarelto 15 mg for anticoagulation.  He  underwent an echo Doppler study which revealed an EF of 60-65%.  His left atrium was only at the upper limits of normal in size.  However, his right atrium was severely dilated.  There was mild tricuspid regurgitation.  I also initiated metoprolol, initially at 12.5 mg since his resting pulse was already in the 60s.  I also discussed the importance of improved CPAP compliance.  He has been using CPAP with 100% compliance. Blood work in 2016 showed stable hemoglobin and hematocrit.  His current renal function continued to improve and was now 1.36.  Lipid studies revealed a total cholesterol 174, triglycerides 63, HDL 55, and LDL 106.  He had a normal magnesium.  TSH was 3.53.  He states he has not taken Xarelto for at least 10 days when he began to notice some flecks of blood in stool.  He denied any overt GI bleeding.   When I saw him in November 2016.  I discontinued his Xarelto and changed him to Eliquis 5 mg twice a day.  His renal function had improved.  I recommended that he discontinue aspirin.  I entered amiodarone at 2 mg daily in attempt to possibly pharmacologically cardiovert him.  He presents now for follow-up evaluation.  [facility] with worsening shortness of breath, orthopnea, and weight gain, after his diuretic dose was reduced due to concerns for worsening renal function.  e was treated with IV Lasix and diuresed 7 pounds.  He  was  transitioned to his prior dose of torsemide 20 mg twice a day.  His chest x-ray was concerning for a possible community-acquired pneumonia and he was empirically placed on IV Levaquin and transition to oral therapy.  He has continued to be on eliquis  for anticoagulation.    Since I last saw him in March 2018.  He developed episodes of chest pain and was seen by Omar Lambert on 10/17/2016.  A Lexiscan Myoview study was low risk with an EF 47%.  He underwent procedure on 10/27/2016 shortly after going home, became  lethargic and was found unresponsive by his wife.  Upon EMS arrival, he was in atrial fibrillation with slow ventricular response.  He was found to be volume overloaded was hospitalized and required intubation.  He was treated with IV diuresis.  He developed worsening renal function with acute on chronic renal failure.  He had temporary hemodialysis and his eliquis was switched to Coumadin.  An echo Doppler study showed an EF of 65-70% with moderate LVH and without regional wall motion abnormalities.  PA pressure was increased at 38 mm.  There was severe TR, moderate artery dilation.  He was seen by Omar Lambert last in 12/03/2016, which time he still had diminished breath sounds bilaterally at his bases.  His ECG showed first-degree AV block with sinus rhythm.  He had been on amiodarone, which was discontinued since he saw Omar Lambert.  He wore a 30 day event monitor which showed predominant sinus rhythm with no recurrent AF and isolated PVCs.  Presently, he has been feeling well.  He has sleep apnea and has been on CPAP therapy. Marland Kitchen  He denies recurrent chest pain.  He is followed by nephrology for his chronic kidney disease.  He denies PND, orthopnea.  He presents for reevaluation.   Past Medical History:  Diagnosis Date  . Acute kidney injury superimposed on chronic kidney disease ([place]) 11/2016  . Acute respiratory failure ([place]) 11/2016  . Allergy   . Asthma   . Atrial fibrillation ([place])   . Atrophic gastritis   . Benign essential hypertension   . CHF (congestive heart failure) ([place])   . COPD (chronic obstructive pulmonary disease) ([place])   . Diabetes ([place])   . Diabetes mellitus without complication ([place])   . GERD (gastroesophageal reflux disease)   . Gout attack 09/2012  . Sinusitis, maxillary, chronic   . Thyroid disease   . Type II or unspecified type diabetes mellitus without mention of complication, not stated as uncontrolled     Past Surgical History:  Procedure Laterality Date  . COLONOSCOPY  2003    . EYE SURGERY    . HERNIA REPAIR  1980    No Known Allergies  Current Outpatient Medications  Medication Sig Dispense Refill  . acetaminophen (TYLENOL) 325 MG tablet Take 650 mg by mouth every 6 (six) hours as needed for mild pain.    Marland Kitchen albuterol (PROVENTIL) (2.5 MG/3ML) 0.083% nebulizer solution INHALE CONTENTS OF 1 VIAL IN NEBULIZER EVERY 4 HOURS AS NEEDED FOR WHEEZING AND ASTHMA 75 mL 5  . allopurinol (ZYLOPRIM) 300 MG tablet Take 300 mg by mouth daily.    . bacitracin ophthalmic ointment Place 1 application into the right eye 2 (two) times daily.    . fluticasone (FLONASE) 50 MCG/ACT nasal spray PLACE 2 SPRAYS INTO BOTH NOSTRILS DAILY. 16 g 3  . KLOR-CON M20 20 MEQ tablet TAKE 1 TABLET BY MOUTH EVERY DAY 30 tablet 1  . levothyroxine (SYNTHROID, LEVOTHROID)  175 MCG tablet Take 175 mcg by mouth daily before breakfast.    . linagliptin (TRADJENTA) 5 MG TABS tablet Take 1 tablet (5 mg total) by mouth daily. 90 tablet 3  . montelukast (SINGULAIR) 10 MG tablet Take one tablet by mouth at bedtime as needed for allergies and asthma 90 tablet 0  . Multiple Vitamins-Minerals (CENTRUM SILVER ADULT 50+ PO) Take 1 tablet by mouth daily.    . pantoprazole (PROTONIX) 40 MG tablet TAKE 1 TABLET BY MOUTH EVERY DAY BEFORE SUPPER 30 tablet 6  . pravastatin (PRAVACHOL) 20 MG tablet Take 20 mg by mouth daily.    . silver sulfADIAZINE (SILVADENE) 1 % cream     . torsemide (DEMADEX) 20 MG tablet TAKE 1 TABLET TWICE A DAY 60 tablet 3  . warfarin (COUMADIN) 5 MG tablet Take 1 to 1.5 tablets by mouth daily as directed by coumadin clinic 40 tablet 1   No current facility-administered medications for this visit.     Social history is notable in that he is married for 55 years. He has 3 children 8 grandchildren 6 great-grandchildren. He is retired from [employer]. He completed ninth grade education. He smoked until 1999.  Family History  Problem Relation Age of Onset  . Stroke Mother   . Cancer  Sister   . Diabetes Sister   . COPD Sister   . COPD Sister     ROS General: Negative; No fevers, chills, or night sweats; no 6S with weight loss HEENT: Negative; No changes in vision or hearing, sinus congestion, difficulty swallowing Pulmonary: Positive for severe COPD.  Positive for shortness of breath. Cardiovascular: Negative; No chest pain, presyncope, syncope, palpitations Positive for leg swelling GI: Negative; No nausea, vomiting, diarrhea, or abdominal pain GU: Negative; No dysuria, hematuria, or difficulty voiding Musculoskeletal: Negative; no myalgias, joint pain, or weakness Rheumatologic: Positive for gout  Hematologic/Oncology: Negative; no easy bruising, bleeding Endocrine: Positive for diabetes mellitus Neuro: Negative; no changes in balance, headaches Skin: Negative; No rashes or skin lesions Psychiatric: Negative; No behavioral problems, depression Sleep: Positive for obstructive sleep apnea; now with improved compliance.  No snoring, daytime sleepiness, hypersomnolence, bruxism, restless legs, hypnogognic hallucinations, no cataplexy Other comprehensive 14 point system review is negative.   PE BP (!) 141/73   Pulse 71   Ht _0  (1.651 m)   Wt 221 lb 3.2 oz (100.3 kg)   BMI 36.81 kg/m    Repeat blood pressure by me was 124/78.  Wt Readings from Last 3 Encounters:  02/18/17 221 lb 3.2 oz (100.3 kg)  12/03/16 208 lb (94.3 kg)  12/03/16 220 lb 12.8 oz (100.2 kg)   General: Alert, oriented, no distress.  Skin: normal turgor, no rashes, warm and dry HEENT: Normocephalic, atraumatic. Pupils equal round and reactive to light; sclera anicteric; extraocular muscles intact;  Nose without nasal septal hypertrophy Mouth/Parynx benign; Mallinpatti scale 4 Neck: No JVD, no carotid bruits; normal carotid upstroke Lungs: Slightly decreased breath sounds at bases, no wheezing or rales Chest wall: without tenderness to palpitation Heart: PMI not displaced, RRR, s1 s2  normal, 1/6 systolic murmur, no diastolic murmur, no rubs, gallops, thrills, or heaves Abdomen: Moderate diastases recti; soft, nontender; no hepatosplenomehaly, BS+; abdominal aorta nontender and not dilated by palpation. Back: no CVA tenderness Pulses 2+ Musculoskeletal: full range of motion, normal strength, no joint deformities Extremities: Trace bilateral lower extremity edema; no clubbing cyanosis , Homan's sign negative  Neurologic: grossly nonfocal; Cranial nerves grossly wnl Psychologic: Normal  mood and affect   ECG (independently read by me):  Probable sinus rhythm with borderline first-degree AV block.  No significant ST changes.  Left axis deviation with left anterior hemiblock.  March 2018 ECG (independently read by me): Normal sinus rhythm at 66 bpm.  First-degree AV block with a PR interval at 280 ms.  QTc interval 461.  January 2018 ECG (independently read by me): Normal sinus rhythm at 67 bpm with first-degree AV block.  PR interval 252 ms.  Nonspecific T changes.  March 2017 ECG (independently read by me): Atrial fibrillation at 92 bpm with occasional unifocal PVCs  November 2016 ECG (independently read by me): Atrial fibrillation at 80 bpm with 1 PVC.  11/10/2014 ECG (independently read by me): Atrial flutter with variable block.  Left axis deviation.  Nonspecific T changes.  QTc interval 458 ms.  January 2015 ECG (independently read by me): Sinus rhythm with frequent atrial premature conk contractions as well as occasional unifocal premature ventricular contractions. Nonspecific ST and T wave changes.  Prior ECG of 11/25/2012: Sinus rhythm with first-degree AV block with occasional PVC. The patient is entirely asymptomatic with reference to this ectopy.  LABS: BMP Latest Ref Rng & Units 02/18/2017 11/09/2016 11/08/2016  Glucose 65 - 99 mg/dL 72 71 88  BUN 8 - 27 mg/dL 28(H) 61(H) 63(H)  Creatinine 0.76 - 1.27 mg/dL 2.34(H) 4.31(H) 4.08(H)  BUN/Creat Ratio 10 - 24 12 -  -  Sodium 134 - 144 mmol/L 147(H) 137 141  Potassium 3.5 - 5.2 mmol/L 4.1 3.6 3.6  Chloride 96 - 106 mmol/L 103 103 100(L)  CO2 20 - 29 mmol/L _0 Calcium 8.6 - 10.2 mg/dL 8.9 8.8(L) 8.6(L)   Hepatic Function Latest Ref Rng & Units 02/18/2017 11/09/2016 11/07/2016  Total Protein 6.0 - 8.5 g/dL 7.2 - -  Albumin 3.5 - 4.8 g/dL 4.0 2.8(L) 2.7(L)  AST 0 - 40 IU/L 22 - -  ALT 0 - 44 IU/L 15 - -  Alk Phosphatase 39 - 117 IU/L 116 - -  Total Bilirubin 0.0 - 1.2 mg/dL 0.5 - -  Bilirubin, Direct 0.1 - 0.5 mg/dL - - -   CBC Latest Ref Rng & Units 02/18/2017 11/08/2016 11/07/2016  WBC 3.4 - 10.8 x10E3/uL 3.6 9.5 10.8(H)  Hemoglobin 13.0 - 17.7 g/dL 10.4(L) 11.3(L) 11.5(L)  Hematocrit 37.5 - 51.0 % 32.5(L) 35.3(L) 35.4(L)  Platelets 150 - 379 x10E3/uL 219 359 284   Lab Results  Component Value Date   MCV 96 02/18/2017   MCV 96.7 11/08/2016   MCV 97.5 11/07/2016   Lab Results  Component Value Date   TSH 0.609 02/18/2017   Lab Results  Component Value Date   HGBA1C 5.6 10/28/2016   Lipid Panel     Component Value Date/Time   CHOL 214 (H) 02/18/2017 0944   TRIG 46 02/18/2017 0944   HDL 81 02/18/2017 0944   CHOLHDL 2.6 02/18/2017 0944   CHOLHDL 1.9 03/28/2016 1015   VLDL 8 03/28/2016 1015   LDLCALC 124 (H) 02/18/2017 0944      RADIOLOGY: US Abdomen Complete  11/25/2012   CLINICAL DATA:  Abdominal distention  EXAM: ULTRASOUND ABDOMEN COMPLETE  COMPARISON:  None.  FINDINGS: Gallbladder  No gallstones or wall thickening visualized. No sonographic Murphy sign noted.  Common bile duct  Diameter: 4.2 mm  Liver  No focal lesion identified. Within normal limits in parenchymal echogenicity.  IVC  No abnormality visualized.  Pancreas  Visualized portion unremarkable.  Spleen  Size and appearance within normal limits.  Right Kidney  Length: 9.8 cm in length. A 1.8 cm cyst is noted in the parapelvic region.  Left Kidney  Length: 10.7 cm in length. Echogenicity within normal limits. No mass  or hydronephrosis visualized.  Abdominal aorta  No aneurysm visualized.  IMPRESSION: Right renal cyst. No other focal abnormality is noted.   Electronically Signed   By: Inez Catalina M.D.   On: 11/25/2012 08:12   IMPRESSION:  1. Essential hypertension   2. Paroxysmal atrial fibrillation (HCC)   3. Atrial flutter, unspecified type ([place])   4. Chronic diastolic heart failure ([place])   5. Hyperlipidemia associated with type 2 diabetes mellitus ([place])   6. Chronic obstructive pulmonary disease, unspecified COPD type ([place])   7. CKD (chronic kidney disease) stage 4, GFR 15-29 ml/min (HCC)   8. Hypothyroidism, unspecified type   9. Long term (current) use of anticoagulants   10. Type 2 diabetes mellitus with complication, without long-term current use of insulin ([place])     ASSESSMENT AND PLAN: Omar Lambert is a 77 year old African American male with a long history of prior tobacco use but quit over 18 years ago. He has COPD. A prior echo Doppler study demonstrated normal systolic and diastolic function with mild biatrial enlargement as well as mild dilatation of his right ventricle and mild pulmonary hypertension with an estimated PA pressure 39 mm.  An echo in 2016 showed ejection fraction at 60-65% with moderate left ventricular hypertrophy,  trivial MR, upper normal LA size but severely dilated, RA with mild tricuspid regurgitation and mild pulmonary hypertension with a PA pressure at 38 mm.  When I saw him in November 2016  after not having seen him in over 2 years, he was in atrial flutter with variable block.  He was using an over-the-counter supplement which had a significant amount of caffeine.  When I saw him one month later, he was still in atrial fibrillation. He was admitted to the hospital with acute hypoxic and hypercarbic respiratory failure in the setting of his underlying COPD as well as pulmonary edema due to acute on chronic diastolic heart failure in July 2017.  He had previously  undergone cardioversion and was maintaining sinus rhythm.  Echo Doppler study showed an EF of 60-65%.  He was readmitted to the hospital in March 2018 with worsening shortness of breath, orthopnea and weight gain.  Following eye surgery again required readmission was found to be in A. fib with slow ventricular response and had been on amiodarone.  He has chronic kidney disease, and during that hospitalization developed acute on chronic renal failure requiring hemodialysis 1.  He is followed by Dr. Jimmy Footman for his chronic kidney disease.  Creatinine in October, remained elevated at 4.  Presently, he is fairly well compensated on his current regimen without overt CHF.  He has a history of chronic diastolic heart failure.  He is now taking torsemide 20 mg twice a day with improvement in prior edema.  2.  His renal disease.  He is on warfarin for anticoagulation.  He has hypothyroidism on levothyroxine 175 g.  He has a history of hyperlipidemia, currently on pravastatin 20 mg daily.  He is diabetic on linagliptin 5 mg daily.  His blood pressure today is stable.  He is on CPAP for obstructive sleep apnea and I reiterated the importance of 100% compliance and to use therapy for the entire sleep duration.  I will repeat fasting laboratory today since an  has been done for 3 months.  He will have nephrology follow-up with Dr. Jimmy Footman.  I will see him in 6 months for cardiology reevaluation.  Time spent: 30 minutes Troy Sine, MD, [facility] 02/25/2017 12:23 PM

## 2017-02-19 LAB — COMPREHENSIVE METABOLIC PANEL
ALBUMIN: 4 g/dL (ref 3.5–4.8)
ALK PHOS: 116 IU/L (ref 39–117)
ALT: 15 IU/L (ref 0–44)
AST: 22 IU/L (ref 0–40)
Albumin/Globulin Ratio: 1.3 (ref 1.2–2.2)
BILIRUBIN TOTAL: 0.5 mg/dL (ref 0.0–1.2)
BUN / CREAT RATIO: 12 (ref 10–24)
BUN: 28 mg/dL — ABNORMAL HIGH (ref 8–27)
CHLORIDE: 103 mmol/L (ref 96–106)
CO2: 26 mmol/L (ref 20–29)
CREATININE: 2.34 mg/dL — AB (ref 0.76–1.27)
Calcium: 8.9 mg/dL (ref 8.6–10.2)
GFR calc Af Amer: 30 mL/min/{1.73_m2} — ABNORMAL LOW (ref >59)
GFR calc non Af Amer: 26 mL/min/{1.73_m2} — ABNORMAL LOW (ref >59)
GLOBULIN, TOTAL: 3.2 g/dL (ref 1.5–4.5)
Glucose: 72 mg/dL (ref 65–99)
Potassium: 4.1 mmol/L (ref 3.5–5.2)
Sodium: 147 mmol/L — ABNORMAL HIGH (ref 134–144)
Total Protein: 7.2 g/dL (ref 6.0–8.5)

## 2017-02-19 LAB — CBC WITH DIFFERENTIAL/PLATELET
Basophils Absolute: 0 10*3/uL (ref 0.0–0.2)
Basos: 1 %
EOS (ABSOLUTE): 0.3 10*3/uL (ref 0.0–0.4)
EOS: 8 %
HEMOGLOBIN: 10.4 g/dL — AB (ref 13.0–17.7)
Hematocrit: 32.5 % — ABNORMAL LOW (ref 37.5–51.0)
IMMATURE GRANS (ABS): 0 10*3/uL (ref 0.0–0.1)
Immature Granulocytes: 0 %
LYMPHS ABS: 0.8 10*3/uL (ref 0.7–3.1)
Lymphs: 22 %
MCH: 30.6 pg (ref 26.6–33.0)
MCHC: 32 g/dL (ref 31.5–35.7)
MCV: 96 fL (ref 79–97)
Monocytes Absolute: 0.4 10*3/uL (ref 0.1–0.9)
Monocytes: 10 %
NEUTROS ABS: 2.1 10*3/uL (ref 1.4–7.0)
Neutrophils: 59 %
Platelets: 219 10*3/uL (ref 150–379)
RBC: 3.4 x10E6/uL — ABNORMAL LOW (ref 4.14–5.80)
RDW: 16.4 % — ABNORMAL HIGH (ref 12.3–15.4)
WBC: 3.6 10*3/uL (ref 3.4–10.8)

## 2017-02-19 LAB — LIPID PANEL
CHOLESTEROL TOTAL: 214 mg/dL — AB (ref 100–199)
Chol/HDL Ratio: 2.6 ratio (ref 0.0–5.0)
HDL: 81 mg/dL (ref >39)
LDL CALC: 124 mg/dL — AB (ref 0–99)
Triglycerides: 46 mg/dL (ref 0–149)
VLDL Cholesterol Cal: 9 mg/dL (ref 5–40)

## 2017-02-19 LAB — TSH: TSH: 0.609 u[IU]/mL (ref 0.450–4.500)

## 2017-02-25 ENCOUNTER — Encounter: Payer: Self-pay | Admitting: Cardiovascular Disease

## 2017-02-25 ENCOUNTER — Ambulatory Visit (INDEPENDENT_AMBULATORY_CARE_PROVIDER_SITE_OTHER): Payer: Medicare HMO | Admitting: Pharmacist Clinician (PhC)/ Clinical Pharmacy Specialist

## 2017-02-25 DIAGNOSIS — Z7901 Long term (current) use of anticoagulants: Secondary | ICD-10-CM

## 2017-02-25 DIAGNOSIS — I4891 Unspecified atrial fibrillation: Secondary | ICD-10-CM

## 2017-02-25 LAB — POCT INR: INR: 2.7

## 2017-02-25 NOTE — Patient Instructions (Signed)
Description   Continue taking 1/2 tablet daily except for 1 tablet every Friday, Saturday and Sunday.  Repeat INR in 4 weeks.

## 2017-03-07 ENCOUNTER — Ambulatory Visit (INDEPENDENT_AMBULATORY_CARE_PROVIDER_SITE_OTHER): Payer: Medicare HMO | Admitting: Internal Medicine

## 2017-03-07 ENCOUNTER — Encounter: Payer: Self-pay | Admitting: Internal Medicine

## 2017-03-07 VITALS — BP 132/76 | HR 65 | Temp 97.5°F | Ht 65.0 in | Wt 218.0 lb

## 2017-03-07 DIAGNOSIS — E785 Hyperlipidemia, unspecified: Secondary | ICD-10-CM | POA: Diagnosis not present

## 2017-03-07 DIAGNOSIS — G4733 Obstructive sleep apnea (adult) (pediatric): Secondary | ICD-10-CM | POA: Diagnosis not present

## 2017-03-07 DIAGNOSIS — J449 Chronic obstructive pulmonary disease, unspecified: Secondary | ICD-10-CM | POA: Diagnosis not present

## 2017-03-07 DIAGNOSIS — E119 Type 2 diabetes mellitus without complications: Secondary | ICD-10-CM

## 2017-03-07 DIAGNOSIS — E1169 Type 2 diabetes mellitus with other specified complication: Secondary | ICD-10-CM | POA: Diagnosis not present

## 2017-03-07 DIAGNOSIS — N1832 Chronic kidney disease, stage 3b: Secondary | ICD-10-CM

## 2017-03-07 DIAGNOSIS — I10 Essential (primary) hypertension: Secondary | ICD-10-CM | POA: Diagnosis not present

## 2017-03-07 DIAGNOSIS — E039 Hypothyroidism, unspecified: Secondary | ICD-10-CM | POA: Diagnosis not present

## 2017-03-07 DIAGNOSIS — I4891 Unspecified atrial fibrillation: Secondary | ICD-10-CM

## 2017-03-07 DIAGNOSIS — N183 Chronic kidney disease, stage 3 (moderate): Secondary | ICD-10-CM | POA: Diagnosis not present

## 2017-03-07 DIAGNOSIS — I5032 Chronic diastolic (congestive) heart failure: Secondary | ICD-10-CM

## 2017-03-07 MED ORDER — ROSUVASTATIN CALCIUM 10 MG PO TABS: 10.0000 mg | ORAL_TABLET | Freq: Every day | ORAL | 3 refills | Status: DC

## 2017-03-07 NOTE — Progress Notes (Signed)
Location:  [facility] clinic Provider:  Yenifer Saccente L. Mariea Clonts, D.O., C.M.D.  Code Status: DNR Goals of Care:  Advanced Directives 12/08/2016  Does Patient Have a Medical Advance Directive? Yes  Type of Advance Directive Out of facility DNR (pink MOST or yellow form)  Copy of [place] in Chart? -  Would patient like information on creating a medical advance directive? -  Pre-existing out of facility DNR order (yellow form or pink MOST form) Yellow form placed in chart (order not valid for inpatient use)   Chief Complaint  Patient presents with  . Follow-up    Follow-up  . Medication Refill    No Refills    HPI: Patient is a 77 y.o. male seen today for medical management of chronic diseases.  He was last seen in October and supposed to return just a few days later, but did not show.  It's now two months later.    He has now been attending the coumadin clinic regularly (had not followed through previously after a hospitalization).  He was last seen 02/25/17 there with INR 2.7 with dose of 43m on sun, fri and sat and 2.5mg  other days.  Next INR is 03/25/17.  He's having some tremor of both hands with action like opening something or drinking something.  Can happen if he lays on his arm, too.    Is getting swollen in his breast areas. Says it was doing it before, but comes and goes.  Not sob like he was but a little bit.     Weight is down 3 lbs from 1/19 at 218 today.    Past Medical History:  Diagnosis Date  . Acute kidney injury superimposed on chronic kidney disease ([place]) 11/2016  . Acute respiratory failure ([place]) 11/2016  . Allergy   . Asthma   . Atrial fibrillation ([place])   . Atrophic gastritis   . Benign essential hypertension   . CHF (congestive heart failure) ([place])   . COPD (chronic obstructive pulmonary disease) ([place])   . Diabetes ([place])   . Diabetes mellitus without complication ([place])   . GERD (gastroesophageal reflux disease)   . Gout attack 09/2012  .  Sinusitis, maxillary, chronic   . Thyroid disease   . Type II or unspecified type diabetes mellitus without mention of complication, not stated as uncontrolled     Past Surgical History:  Procedure Laterality Date  . COLONOSCOPY  2003  . EYE SURGERY    . HERNIA REPAIR  1980    No Known Allergies  Outpatient Encounter Medications as of 03/07/2017  Medication Sig  . albuterol (PROVENTIL) (2.5 MG/3ML) 0.083% nebulizer solution INHALE CONTENTS OF 1 VIAL IN NEBULIZER EVERY 4 HOURS AS NEEDED FOR WHEEZING AND ASTHMA  . acetaminophen (TYLENOL) 325 MG tablet Take 650 mg by mouth every 6 (six) hours as needed for mild pain.  Marland Kitchen allopurinol (ZYLOPRIM) 300 MG tablet Take 300 mg by mouth daily.  . bacitracin ophthalmic ointment Place 1 application into the right eye 2 (two) times daily.  . fluticasone (FLONASE) 50 MCG/ACT nasal spray PLACE 2 SPRAYS INTO BOTH NOSTRILS DAILY.  Marland Kitchen KLOR-CON M20 20 MEQ tablet TAKE 1 TABLET BY MOUTH EVERY DAY  . levothyroxine (SYNTHROID, LEVOTHROID) 175 MCG tablet Take 175 mcg by mouth daily before breakfast.  . linagliptin (TRADJENTA) 5 MG TABS tablet Take 1 tablet (5 mg total) by mouth daily.  . montelukast (SINGULAIR) 10 MG tablet Take one tablet by mouth at bedtime as needed for  allergies and asthma  . Multiple Vitamins-Minerals (CENTRUM SILVER ADULT 50+ PO) Take 1 tablet by mouth daily.  . pantoprazole (PROTONIX) 40 MG tablet TAKE 1 TABLET BY MOUTH EVERY DAY BEFORE SUPPER  . pravastatin (PRAVACHOL) 20 MG tablet Take 20 mg by mouth daily.  . silver sulfADIAZINE (SILVADENE) 1 % cream   . torsemide (DEMADEX) 20 MG tablet TAKE 1 TABLET TWICE A DAY  . warfarin (COUMADIN) 5 MG tablet Take 1 to 1.5 tablets by mouth daily as directed by coumadin clinic   No facility-administered encounter medications on file as of 03/07/2017.     Review of Systems:  Review of Systems  Constitutional: Negative for chills, fever and malaise/fatigue.  HENT: Negative for congestion.   Eyes:  Negative for blurred vision.  Respiratory: Positive for shortness of breath. Negative for cough and wheezing.   Cardiovascular: Negative for chest pain, palpitations and leg swelling.  Gastrointestinal: Negative for abdominal pain, blood in stool, constipation and melena.  Genitourinary: Negative for dysuria.  Musculoskeletal: Negative for falls.  Skin: Negative for itching and rash.  Neurological: Negative for dizziness, loss of consciousness and weakness.  Endo/Heme/Allergies: Does not bruise/bleed easily.  Psychiatric/Behavioral: Negative for depression and memory loss. The patient is not nervous/anxious and does not have insomnia.     Health Maintenance  Topic Date Due  . URINE MICROALBUMIN  05/29/2016  . FOOT EXAM  03/02/2017  . HEMOGLOBIN A1C  04/27/2017  . OPHTHALMOLOGY EXAM  02/07/2018  . TETANUS/TDAP  02/05/2021  . INFLUENZA VACCINE  Completed  . PNA vac Low Risk Adult  Completed    Physical Exam: Vitals:   03/07/17 1137  Weight: 218 lb (98.9 kg)  Height: 5\' 5"  (1.651 m)   Body mass index is 36.28 kg/m. Physical Exam  Constitutional: He is oriented to person, place, and time. He appears well-developed and well-nourished. No distress.  Cardiovascular:  irreg irreg; gynecomastia noted  Pulmonary/Chest: Effort normal and breath sounds normal. He has no rales.  Abdominal: Soft. Bowel sounds are normal.  Musculoskeletal: Normal range of motion. He exhibits tenderness.  Over anterior aspect of knee  Neurological: He is alert and oriented to person, place, and time.  Skin: Skin is warm and dry. Capillary refill takes less than 2 seconds.  Psychiatric: He has a normal mood and affect.    Labs reviewed: Basic Metabolic Panel: Recent Labs    07/20/16 0935  10/31/16 1355  11/05/16 0356 11/06/16 0319 11/07/16 0313 11/08/16 0423 11/09/16 0621 02/18/17 0944  NA 144   < >  --    < > 147* 142 144 141 137 147*  K 4.1   < >  --    < > 3.7 3.7 4.0 3.6 3.6 4.1  CL 101    < >  --    < > 109 106 106 100* 103 103  CO2 27   < >  --    < > 25 27 25 26 24 26   GLUCOSE 111*   < >  --    < > 101* 99 90 88 71 72  BUN 59*   < >  --    < > 104* 55* 68* 63* 61* 28*  CREATININE 3.43*   < >  --    < > 5.36* 3.59* 4.12* 4.08* 4.31* 2.34*  CALCIUM 8.8   < >  --    < > 8.9 8.7* 9.0 8.6* 8.8* 8.9  MG  --    < >  --    < >  2.7* 2.2 2.4  --   --   --   PHOS  --    < >  --    < > 6.9* 5.0* 7.3* 5.0* 4.6  --   TSH 4.00  --  2.561  --   --   --   --   --   --  0.609   < > = values in this interval not displayed.   Liver Function Tests: Recent Labs    10/27/16 0025  11/06/16 0319 11/07/16 0313 11/09/16 0621 02/18/17 0944  AST 361*  --  72*  --   --  22  ALT 290*  --  183*  --   --  15  ALKPHOS 169*  --  185*  --   --  116  BILITOT 1.9*  --  2.4*  --   --  0.5  PROT 6.9  --  6.9  --   --  7.2  ALBUMIN 3.4*   < > 2.6*  2.6* 2.7* 2.8* 4.0   < > = values in this interval not displayed.   No results for input(s): LIPASE, AMYLASE in the last 8760 hours. No results for input(s): AMMONIA in the last 8760 hours. CBC: Recent Labs    11/02/16 0251 11/03/16 0512  11/07/16 0313 11/08/16 0423 02/18/17 0944  WBC 5.9 7.6   < > 10.8* 9.5 3.6  NEUTROABS 4.5 5.6  --   --   --  2.1  HGB 11.6* 11.5*   < > 11.5* 11.3* 10.4*  HCT 35.6* 35.1*   < > 35.4* 35.3* 32.5*  MCV 94.4 93.9   < > 97.5 96.7 96  PLT 144* 171   < > 284 359 219   < > = values in this interval not displayed.   Lipid Panel: Recent Labs    03/28/16 1015 11/06/16 0319 02/18/17 0944  CHOL 162  --  214*  HDL 84  --  81  LDLCALC 70  --  124*  TRIG 41 88 46  CHOLHDL 1.9  --  2.6   Lab Results  Component Value Date   HGBA1C 5.6 10/28/2016    Assessment/Plan 1. Atrial fibrillation with slow ventricular response (HCC) -doing fine right now, cont warfarin with INR goal 2-3 per coumadin clinic  2. Chronic kidney disease (CKD) stage G3b/A2, moderately decreased glomerular filtration rate (GFR) between 30-44  mL/min/1.73 square meter and albuminuria creatinine ratio between 30-299 mg/g (HCC) -renal function has improved considerably on his last labs this month  3. DM type 2, goal HbA1c < 7% (HCC) -hba1c excellent last check, cont to work on diet and exercise  4. Essential hypertension, benign -bp at goal with current regimen, no changes  5. Chronic diastolic CHF (congestive heart failure), NYHA class 2 ([place]) -oddly, pt reports he is taking spironolactone, but med list shows only torsemide and potassium for this--if he is taking this, it was to be stopped; also it should be stopped now due to gynecomastia  6. COPD GOLD II -cont singulair, should also keep albuterol on hand, but not on list again this time probably b/c he reports not using it and it gets removed  7. Hypothyroidism, unspecified type -cont levothyroxine, tsh wnl last three checks  8. Hyperlipidemia associated with type 2 diabetes mellitus [facility]) -cardiology had advised he change his cholesterol medication 2 wks ago, but appears it was never sent into the pharmacy and pt does not seem to know anything about it - rosuvastatin (CRESTOR)  10 MG tablet; Take 1 tablet (10 mg total) by mouth daily.  Dispense: 30 tablet; Refill: 3 instead of pravachol (but crestor may be too expensive)  Labs/tests ordered:  No new (just had labs with cardiology which I reviewed) Next appt:  4 mos annual exam  Esty Ahuja L. Chon Buhl, D.O. [facility] [address], [place] [address] Cell Phone (Mon-Fri 8am-5pm):  [phone] On Call:  [phone] & follow prompts after 5pm & weekends Office Phone:  [phone] Office Fax:  [phone]

## 2017-03-07 NOTE — Patient Instructions (Addendum)
Keep moving to prevent stiffness. Use topical ointments like aspercreme on your left knee and your tylenol for pain.  Stop aldactone/spironolactone.  We have down that you are not taking it anymore.  This is the medication that can cause breast enlargement.  I'll change your cholesterol medication as Dr. Claiborne Billings suggested (stop pravachol/pravastatin and start crestor 10mg  daily).  If this is too expensive, please call me and let me know so we can try an alternative.

## 2017-03-23 ENCOUNTER — Other Ambulatory Visit: Payer: Self-pay | Admitting: Cardiovascular Disease

## 2017-03-25 ENCOUNTER — Ambulatory Visit (INDEPENDENT_AMBULATORY_CARE_PROVIDER_SITE_OTHER): Payer: Medicare HMO | Admitting: Pharmacist

## 2017-03-25 DIAGNOSIS — I4891 Unspecified atrial fibrillation: Secondary | ICD-10-CM

## 2017-03-25 DIAGNOSIS — Z7901 Long term (current) use of anticoagulants: Secondary | ICD-10-CM | POA: Diagnosis not present

## 2017-03-25 LAB — POCT INR: INR: 2.4

## 2017-03-29 DIAGNOSIS — M109 Gout, unspecified: Secondary | ICD-10-CM | POA: Diagnosis not present

## 2017-03-29 DIAGNOSIS — D631 Anemia in chronic kidney disease: Secondary | ICD-10-CM | POA: Diagnosis not present

## 2017-03-29 DIAGNOSIS — I509 Heart failure, unspecified: Secondary | ICD-10-CM | POA: Diagnosis not present

## 2017-03-29 DIAGNOSIS — E1122 Type 2 diabetes mellitus with diabetic chronic kidney disease: Secondary | ICD-10-CM | POA: Diagnosis not present

## 2017-03-29 DIAGNOSIS — I129 Hypertensive chronic kidney disease with stage 1 through stage 4 chronic kidney disease, or unspecified chronic kidney disease: Secondary | ICD-10-CM | POA: Diagnosis not present

## 2017-03-29 DIAGNOSIS — N184 Chronic kidney disease, stage 4 (severe): Secondary | ICD-10-CM | POA: Diagnosis not present

## 2017-03-29 DIAGNOSIS — I4891 Unspecified atrial fibrillation: Secondary | ICD-10-CM | POA: Diagnosis not present

## 2017-03-29 DIAGNOSIS — N2581 Secondary hyperparathyroidism of renal origin: Secondary | ICD-10-CM | POA: Diagnosis not present

## 2017-03-29 DIAGNOSIS — E785 Hyperlipidemia, unspecified: Secondary | ICD-10-CM | POA: Diagnosis not present

## 2017-04-11 ENCOUNTER — Other Ambulatory Visit: Payer: Self-pay | Admitting: *Deleted

## 2017-04-11 DIAGNOSIS — E785 Hyperlipidemia, unspecified: Principal | ICD-10-CM

## 2017-04-11 DIAGNOSIS — E1169 Type 2 diabetes mellitus with other specified complication: Secondary | ICD-10-CM

## 2017-04-11 MED ORDER — ROSUVASTATIN CALCIUM 20 MG PO TABS: 20.0000 mg | ORAL_TABLET | Freq: Every day | ORAL | 3 refills | Status: DC

## 2017-04-21 ENCOUNTER — Other Ambulatory Visit: Payer: Self-pay | Admitting: Internal Medicine

## 2017-04-22 ENCOUNTER — Ambulatory Visit (INDEPENDENT_AMBULATORY_CARE_PROVIDER_SITE_OTHER): Payer: Medicare HMO | Admitting: Pharmacist Clinician (PhC)/ Clinical Pharmacy Specialist

## 2017-04-22 DIAGNOSIS — Z7901 Long term (current) use of anticoagulants: Secondary | ICD-10-CM | POA: Diagnosis not present

## 2017-04-22 DIAGNOSIS — I4891 Unspecified atrial fibrillation: Secondary | ICD-10-CM | POA: Diagnosis not present

## 2017-04-22 LAB — POCT INR: INR: 2.4

## 2017-04-29 ENCOUNTER — Other Ambulatory Visit: Payer: Self-pay | Admitting: Internal Medicine

## 2017-04-30 DIAGNOSIS — H2512 Age-related nuclear cataract, left eye: Secondary | ICD-10-CM | POA: Diagnosis not present

## 2017-05-06 DIAGNOSIS — H2512 Age-related nuclear cataract, left eye: Secondary | ICD-10-CM | POA: Diagnosis not present

## 2017-05-06 HISTORY — PX: EYE SURGERY: SHX253

## 2017-05-20 ENCOUNTER — Ambulatory Visit (INDEPENDENT_AMBULATORY_CARE_PROVIDER_SITE_OTHER): Payer: Medicare HMO | Admitting: Pharmacist Clinician (PhC)/ Clinical Pharmacy Specialist

## 2017-05-20 DIAGNOSIS — I4891 Unspecified atrial fibrillation: Secondary | ICD-10-CM

## 2017-05-20 DIAGNOSIS — Z7901 Long term (current) use of anticoagulants: Secondary | ICD-10-CM

## 2017-05-20 LAB — POCT INR: INR: 2.7

## 2017-05-22 ENCOUNTER — Other Ambulatory Visit: Payer: Self-pay | Admitting: Cardiovascular Disease

## 2017-05-28 DIAGNOSIS — N2581 Secondary hyperparathyroidism of renal origin: Secondary | ICD-10-CM | POA: Diagnosis not present

## 2017-05-28 DIAGNOSIS — N184 Chronic kidney disease, stage 4 (severe): Secondary | ICD-10-CM | POA: Diagnosis not present

## 2017-05-29 ENCOUNTER — Ambulatory Visit (INDEPENDENT_AMBULATORY_CARE_PROVIDER_SITE_OTHER): Payer: Medicare HMO

## 2017-05-29 VITALS — BP 140/68 | HR 66 | Temp 97.8°F | Ht 65.0 in | Wt 229.0 lb

## 2017-05-29 DIAGNOSIS — Z0000 Encounter for general adult medical examination without abnormal findings: Secondary | ICD-10-CM | POA: Diagnosis not present

## 2017-05-29 NOTE — Progress Notes (Signed)
Subjective:   Omar Lambert is a 77 y.o. male who presents for Medicare Annual/Subsequent preventive examination.  Last AWV-03/01/2016    Objective:    Vitals: BP 140/68 (BP Location: Left Arm, Patient Position: Sitting)   Pulse 66   Temp 97.8 F (36.6 C) (Oral)   Ht 5\' 5"  (1.651 m)   Wt 229 lb (103.9 kg)   SpO2 93%   BMI 38.11 kg/m   Body mass index is 38.11 kg/m.  Advanced Directives 05/29/2017 12/08/2016 11/04/2016 07/20/2016 05/28/2016 05/10/2016 04/10/2016  Does Patient Have a Medical Advance Directive? No Yes No No Yes Yes No  Type of Advance Directive - Out of facility DNR (pink MOST or yellow form) - - Press photographer;Living will [place];Living will -  Copy of [place] in Chart? - - - - Yes Yes -  Would patient like information on creating a medical advance directive? Yes (MAU/Ambulatory/Procedural Areas - Information given) - No - Patient declined No - Patient declined - - No - Patient declined  Pre-existing out of facility DNR order (yellow form or pink MOST form) - Yellow form placed in chart (order not valid for inpatient use) - - - - -    Tobacco Social History   Tobacco Use  Smoking Status Former Smoker  . Packs/day: 1.00  . Years: 20.00  . Pack years: 20.00  . Types: Cigarettes  Smokeless Tobacco Never Used  Tobacco Comment   QUIT SMOKING IN 2000     Counseling given: Not Answered Comment: QUIT SMOKING IN 2000   Clinical Intake:  Pre-visit preparation completed: No  Pain : No/denies pain     Nutritional Risks: None Diabetes: Yes CBG done?: No Did pt. bring in CBG monitor from home?: No  How often do you need to have someone help you when you read instructions, pamphlets, or other written materials from your doctor or pharmacy?: 1 - Never What is the last grade level you completed in school?: 9th grade   Interpreter Needed?: No  Information entered by :: Tyson Dense, RN  Past Medical  History:  Diagnosis Date  . Acute kidney injury superimposed on chronic kidney disease ([place]) 11/2016  . Acute respiratory failure ([place]) 11/2016  . Allergy   . Asthma   . Atrial fibrillation ([place])   . Atrophic gastritis   . Benign essential hypertension   . CHF (congestive heart failure) ([place])   . COPD (chronic obstructive pulmonary disease) ([place])   . Diabetes ([place])   . Diabetes mellitus without complication ([place])   . GERD (gastroesophageal reflux disease)   . Gout attack 09/2012  . Sinusitis, maxillary, chronic   . Thyroid disease   . Type II or unspecified type diabetes mellitus without mention of complication, not stated as uncontrolled    Past Surgical History:  Procedure Laterality Date  . COLONOSCOPY  2003  . EYE SURGERY Left 05/06/2017   Cataract removed  . EYE SURGERY Right 02/05/2013   Cataract removed  . HERNIA REPAIR  1980   Family History  Problem Relation Age of Onset  . Stroke Mother   . Cancer Sister   . Diabetes Sister   . COPD Sister   . COPD Sister    Social History   Socioeconomic History  . Marital status: Married    Spouse name: Not on file  . Number of children: 3  . Years of education: Not on file  . Highest education level: Not on  file  Occupational History  . Occupation: Retired  Scientific laboratory technician  . Financial resource strain: Not hard at all  . Food insecurity:    Worry: Never true    Inability: Never true  . Transportation needs:    Medical: No    Non-medical: No  Tobacco Use  . Smoking status: Former Smoker    Packs/day: 1.00    Years: 20.00    Pack years: 20.00    Types: Cigarettes  . Smokeless tobacco: Never Used  . Tobacco comment: QUIT SMOKING IN 2000  Substance and Sexual Activity  . Alcohol use: No  . Drug use: No  . Sexual activity: Not Currently  Lifestyle  . Physical activity:    Days per week: 0 days    Minutes per session: 0 min  . Stress: Not at all  Relationships  . Social connections:    Talks on phone: Once  a week    Gets together: Twice a week    Attends religious service: More than 4 times per year    Active member of club or organization: No    Attends meetings of clubs or organizations: Never    Relationship status: Married  Other Topics Concern  . Not on file  Social History Narrative   ** Merged History Encounter **       Lives in 1 story apt with his wife, Omar Lambert.  Sometimes exercises.  Drinks coffee.      Outpatient Encounter Medications as of 05/29/2017  Medication Sig  . acetaminophen (TYLENOL) 325 MG tablet Take 650 mg by mouth every 6 (six) hours as needed for mild pain.  Marland Kitchen allopurinol (ZYLOPRIM) 300 MG tablet Take 300 mg by mouth daily.  . bacitracin ophthalmic ointment Place 1 application into the right eye 2 (two) times daily.  Marland Kitchen KLOR-CON M20 20 MEQ tablet TAKE 1 TABLET BY MOUTH EVERY DAY  . levothyroxine (SYNTHROID, LEVOTHROID) 175 MCG tablet Take 175 mcg by mouth daily before breakfast.  . linagliptin (TRADJENTA) 5 MG TABS tablet Take 1 tablet (5 mg total) by mouth daily.  . montelukast (SINGULAIR) 10 MG tablet Take one tablet by mouth at bedtime as needed for allergies and asthma  . Multiple Vitamins-Minerals (CENTRUM SILVER ADULT 50+ PO) Take 1 tablet by mouth daily.  . pantoprazole (PROTONIX) 40 MG tablet TAKE 1 TABLET BY MOUTH EVERY DAY BEFORE SUPPER  . rosuvastatin (CRESTOR) 20 MG tablet Take 1 tablet (20 mg total) by mouth daily.  Marland Kitchen torsemide (DEMADEX) 20 MG tablet TAKE 1 TABLET TWICE A DAY  . warfarin (COUMADIN) 5 MG tablet TAKE 1 TO 1.5 TABLETS BY MOUTH DAILY AS DIRECTED BY COUMADIN CLINIC  . [DISCONTINUED] fluticasone (FLONASE) 50 MCG/ACT nasal spray PLACE 2 SPRAYS INTO BOTH NOSTRILS DAILY.  . [DISCONTINUED] levothyroxine (SYNTHROID, LEVOTHROID) 175 MCG tablet TAKE 1 TABLET EVERY DAY BEFORE BREAKFAST   No facility-administered encounter medications on file as of 05/29/2017.     Activities of Daily Living In your present state of health, do you have any  difficulty performing the following activities: 05/29/2017 11/04/2016  Hearing? N N  Vision? N Y  Difficulty concentrating or making decisions? N N  Walking or climbing stairs? N N  Dressing or bathing? N N  Doing errands, shopping? N N  Preparing Food and eating ? N -  Using the Toilet? N -  In the past six months, have you accidently leaked urine? N -  Do you have problems with loss of bowel control? N -  Managing your Medications? N -  Managing your Finances? N -  Housekeeping or managing your Housekeeping? N -  Some recent data might be hidden    Patient Care Team: Gayland Curry, DO as PCP - General (Geriatric Medicine) Troy Sine, MD as PCP - Cardiology (Cardiology) Pyrtle, Lajuan Lines, MD as Consulting Physician (Gastroenterology) Tanda Rockers, MD as Consulting Physician (Pulmonary Disease) Clent Jacks, MD as Consulting Physician (Ophthalmology) Gayland Curry, DO (Geriatric Medicine)   Assessment:   This is a routine wellness examination for Ayyan.  Exercise Activities and Dietary recommendations Current Exercise Habits: The patient does not participate in regular exercise at present, Exercise limited by: None identified  Goals    . Weight (lb) < 215 lb (97.5 kg)     Starting 03/01/16, I will attempt to decrease my current weight of 235 lb down to 215 lb.        Fall Risk Fall Risk  05/29/2017 03/07/2017 07/20/2016 05/17/2016 05/10/2016  Falls in the past year? No No No No No  Risk for fall due to : - - - - -   Is the patient's home free of loose throw rugs in walkways, pet beds, electrical cords, etc?   yes      Grab bars in the bathroom? no      Handrails on the stairs?   yes      Adequate lighting?   yes  Depression Screen PHQ 2/9 Scores 05/29/2017 03/07/2017 07/20/2016 05/17/2016  PHQ - 2 Score 0 0 0 0    Cognitive Function MMSE - Mini Mental State Exam 05/29/2017 03/01/2016 02/28/2015  Not completed: - - (No Data)  Orientation to time 5 5 5   Orientation to  Place 5 5 5   Registration 3 3 3   Attention/ Calculation 0 4 0  Recall 1 2 1   Language- name 2 objects 2 2 2   Language- repeat 1 1 1   Language- follow 3 step command 3 3 3   Language- read & follow direction 1 1 1   Write a sentence 1 1 0  Copy design 1 0 1  Total score 23 27 22         Immunization History  Administered Date(s) Administered  . Influenza, High Dose Seasonal PF 11/08/2016  . Influenza,inj,Quad PF,6+ Mos 11/22/2014, 10/13/2015  . Pneumococcal Conjugate-13 01/28/2014  . Pneumococcal Polysaccharide-23 02/05/2005  . Td 02/06/2011    Qualifies for Shingles Vaccine? Yes, educated and declined  Screening Tests Health Maintenance  Topic Date Due  . URINE MICROALBUMIN  05/29/2016  . FOOT EXAM  03/02/2017  . HEMOGLOBIN A1C  04/27/2017  . INFLUENZA VACCINE  09/05/2017  . OPHTHALMOLOGY EXAM  02/07/2018  . TETANUS/TDAP  02/05/2021  . PNA vac Low Risk Adult  Completed   Cancer Screenings: Lung: Low Dose CT Chest recommended if Age 61-80 years, 30 pack-year currently smoking OR have quit w/in 15years. Patient does not qualify. Colorectal: up to date  Additional Screenings:  Hepatitis C Screening:declined      Plan:    I have personally reviewed and addressed the Medicare Annual Wellness questionnaire and have noted the following in the patient's chart:  A. Medical and social history B. Use of alcohol, tobacco or illicit drugs  C. Current medications and supplements D. Functional ability and status E.  Nutritional status F.  Physical activity G. Advance directives H. List of other physicians I.  Hospitalizations, surgeries, and ER visits in previous 12 months J.  Vitals K. Screenings to include  hearing, vision, cognitive, depression L. Referrals and appointments - none  In addition, I have reviewed and discussed with patient certain preventive protocols, quality metrics, and best practice recommendations. A written personalized care plan for preventive services  as well as general preventive health recommendations were provided to patient.  See attached scanned questionnaire for additional information.   Signed,   Tyson Dense, RN Nurse Health Advisor  Patient Concerns: None

## 2017-05-29 NOTE — Patient Instructions (Addendum)
Omar Lambert , Thank you for taking time to come for your Medicare Wellness Visit. I appreciate your ongoing commitment to your health goals. Please review the following plan we discussed and let me know if I can assist you in the future.   Screening recommendations/referrals: Colonoscopy excluded, you are over age 78 Recommended yearly ophthalmology/optometry visit for glaucoma screening and checkup Recommended yearly dental visit for hygiene and checkup  Vaccinations: Influenza vaccine up to date, due 2019 fall season Pneumococcal vaccine up to date, completed Tdap vaccine up to date, due 02/06/2011 Shingles vaccine due, declined    Advanced directives: Advance directive discussed with you today. I have provided a copy for you to complete at home and have notarized. Once this is complete please bring a copy in to our office so we can scan it into your chart.  Conditions/risks identified: none  Next appointment: Dr. Mariea Clonts 07/18/2017 @ [place], RN 06/06/2018 @ 8:30am  Preventive Care 65 Years and Older, Male Preventive care refers to lifestyle choices and visits with your health care provider that can promote health and wellness. What does preventive care include?  A yearly physical exam. This is also called an annual well check.  Dental exams once or twice a year.  Routine eye exams. Ask your health care provider how often you should have your eyes checked.  Personal lifestyle choices, including:  Daily care of your teeth and gums.  Regular physical activity.  Eating a healthy diet.  Avoiding tobacco and drug use.  Limiting alcohol use.  Practicing safe sex.  Taking low doses of aspirin every day.  Taking vitamin and mineral supplements as recommended by your health care provider. What happens during an annual well check? The services and screenings done by your health care provider during your annual well check will depend on your age, overall health,  lifestyle risk factors, and family history of disease. Counseling  Your health care provider may ask you questions about your:  Alcohol use.  Tobacco use.  Drug use.  Emotional well-being.  Home and relationship well-being.  Sexual activity.  Eating habits.  History of falls.  Memory and ability to understand (cognition).  Work and work Statistician. Screening  You may have the following tests or measurements:  Height, weight, and BMI.  Blood pressure.  Lipid and cholesterol levels. These may be checked every 5 years, or more frequently if you are over 97 years old.  Skin check.  Lung cancer screening. You may have this screening every year starting at age 25 if you have a 30-pack-year history of smoking and currently smoke or have quit within the past 15 years.  Fecal occult blood test (FOBT) of the stool. You may have this test every year starting at age 63.  Flexible sigmoidoscopy or colonoscopy. You may have a sigmoidoscopy every 5 years or a colonoscopy every 10 years starting at age 50.  Prostate cancer screening. Recommendations will vary depending on your family history and other risks.  Hepatitis C blood test.  Hepatitis B blood test.  Sexually transmitted disease (STD) testing.  Diabetes screening. This is done by checking your blood sugar (glucose) after you have not eaten for a while (fasting). You may have this done every 1-3 years.  Abdominal aortic aneurysm (AAA) screening. You may need this if you are a current or former smoker.  Osteoporosis. You may be screened starting at age 32 if you  are at high risk. Talk with your health care provider about your test results, treatment options, and if necessary, the need for more tests. Vaccines  Your health care provider may recommend certain vaccines, such as:  Influenza vaccine. This is recommended every year.  Tetanus, diphtheria, and acellular pertussis (Tdap, Td) vaccine. You may need a Td booster  every 10 years.  Zoster vaccine. You may need this after age 50.  Pneumococcal 13-valent conjugate (PCV13) vaccine. One dose is recommended after age 52.  Pneumococcal polysaccharide (PPSV23) vaccine. One dose is recommended after age 48. Talk to your health care provider about which screenings and vaccines you need and how often you need them. This information is not intended to replace advice given to you by your health care provider. Make sure you discuss any questions you have with your health care provider. Document Released: 02/18/2015 Document Revised: 10/12/2015 Document Reviewed: 11/23/2014 Elsevier Interactive Patient Education  2017 [employer] Prevention in the Home Falls can cause injuries. They can happen to people of all ages. There are many things you can do to make your home safe and to help prevent falls. What can I do on the outside of my home?  Regularly fix the edges of walkways and driveways and fix any cracks.  Remove anything that might make you trip as you walk through a door, such as a raised step or threshold.  Trim any bushes or trees on the path to your home.  Use bright outdoor lighting.  Clear any walking paths of anything that might make someone trip, such as rocks or tools.  Regularly check to see if handrails are loose or broken. Make sure that both sides of any steps have handrails.  Any raised decks and porches should have guardrails on the edges.  Have any leaves, snow, or ice cleared regularly.  Use sand or salt on walking paths during winter.  Clean up any spills in your garage right away. This includes oil or grease spills. What can I do in the bathroom?  Use night lights.  Install grab bars by the toilet and in the tub and shower. Do not use towel bars as grab bars.  Use non-skid mats or decals in the tub or shower.  If you need to sit down in the shower, use a plastic, non-slip stool.  Keep the floor dry. Clean up any  water that spills on the floor as soon as it happens.  Remove soap buildup in the tub or shower regularly.  Attach bath mats securely with double-sided non-slip rug tape.  Do not have throw rugs and other things on the floor that can make you trip. What can I do in the bedroom?  Use night lights.  Make sure that you have a light by your bed that is easy to reach.  Do not use any sheets or blankets that are too big for your bed. They should not hang down onto the floor.  Have a firm chair that has side arms. You can use this for support while you get dressed.  Do not have throw rugs and other things on the floor that can make you trip. What can I do in the kitchen?  Clean up any spills right away.  Avoid walking on wet floors.  Keep items that you use a lot in easy-to-reach places.  If you need to reach something above you, use a strong step stool that has a grab bar.  Keep electrical cords out  of the way.  Do not use floor polish or wax that makes floors slippery. If you must use wax, use non-skid floor wax.  Do not have throw rugs and other things on the floor that can make you trip. What can I do with my stairs?  Do not leave any items on the stairs.  Make sure that there are handrails on both sides of the stairs and use them. Fix handrails that are broken or loose. Make sure that handrails are as long as the stairways.  Check any carpeting to make sure that it is firmly attached to the stairs. Fix any carpet that is loose or worn.  Avoid having throw rugs at the top or bottom of the stairs. If you do have throw rugs, attach them to the floor with carpet tape.  Make sure that you have a light switch at the top of the stairs and the bottom of the stairs. If you do not have them, ask someone to add them for you. What else can I do to help prevent falls?  Wear shoes that:  Do not have high heels.  Have rubber bottoms.  Are comfortable and fit you well.  Are closed  at the toe. Do not wear sandals.  If you use a stepladder:  Make sure that it is fully opened. Do not climb a closed stepladder.  Make sure that both sides of the stepladder are locked into place.  Ask someone to hold it for you, if possible.  Clearly mark and make sure that you can see:  Any grab bars or handrails.  First and last steps.  Where the edge of each step is.  Use tools that help you move around (mobility aids) if they are needed. These include:  Canes.  Walkers.  Scooters.  Crutches.  Turn on the lights when you go into a dark area. Replace any light bulbs as soon as they burn out.  Set up your furniture so you have a clear path. Avoid moving your furniture around.  If any of your floors are uneven, fix them.  If there are any pets around you, be aware of where they are.  Review your medicines with your doctor. Some medicines can make you feel dizzy. This can increase your chance of falling. Ask your doctor what other things that you can do to help prevent falls. This information is not intended to replace advice given to you by your health care provider. Make sure you discuss any questions you have with your health care provider. Document Released: 11/18/2008 Document Revised: 06/30/2015 Document Reviewed: 02/26/2014 Elsevier Interactive Patient Education  2017 [employer].

## 2017-06-06 DIAGNOSIS — G4733 Obstructive sleep apnea (adult) (pediatric): Secondary | ICD-10-CM | POA: Diagnosis not present

## 2017-06-13 DIAGNOSIS — H52209 Unspecified astigmatism, unspecified eye: Secondary | ICD-10-CM | POA: Diagnosis not present

## 2017-06-13 DIAGNOSIS — H524 Presbyopia: Secondary | ICD-10-CM | POA: Diagnosis not present

## 2017-06-13 DIAGNOSIS — H5213 Myopia, bilateral: Secondary | ICD-10-CM | POA: Diagnosis not present

## 2017-06-17 ENCOUNTER — Ambulatory Visit (INDEPENDENT_AMBULATORY_CARE_PROVIDER_SITE_OTHER): Payer: Medicare HMO | Admitting: Pharmacist

## 2017-06-17 DIAGNOSIS — Z7901 Long term (current) use of anticoagulants: Secondary | ICD-10-CM

## 2017-06-17 DIAGNOSIS — I4891 Unspecified atrial fibrillation: Secondary | ICD-10-CM | POA: Diagnosis not present

## 2017-06-17 LAB — POCT INR: INR: 2.2

## 2017-06-19 ENCOUNTER — Other Ambulatory Visit: Payer: Self-pay | Admitting: Internal Medicine

## 2017-06-19 DIAGNOSIS — M1A9XX0 Chronic gout, unspecified, without tophus (tophi): Secondary | ICD-10-CM

## 2017-06-28 ENCOUNTER — Encounter (HOSPITAL_BASED_OUTPATIENT_CLINIC_OR_DEPARTMENT_OTHER): Payer: Medicare HMO | Attending: Internal Medicine

## 2017-06-28 DIAGNOSIS — I130 Hypertensive heart and chronic kidney disease with heart failure and stage 1 through stage 4 chronic kidney disease, or unspecified chronic kidney disease: Secondary | ICD-10-CM | POA: Diagnosis not present

## 2017-06-28 DIAGNOSIS — Z87891 Personal history of nicotine dependence: Secondary | ICD-10-CM | POA: Diagnosis not present

## 2017-06-28 DIAGNOSIS — E1122 Type 2 diabetes mellitus with diabetic chronic kidney disease: Secondary | ICD-10-CM | POA: Insufficient documentation

## 2017-06-28 DIAGNOSIS — N183 Chronic kidney disease, stage 3 (moderate): Secondary | ICD-10-CM | POA: Diagnosis not present

## 2017-06-28 DIAGNOSIS — G4730 Sleep apnea, unspecified: Secondary | ICD-10-CM | POA: Diagnosis not present

## 2017-06-28 DIAGNOSIS — E1151 Type 2 diabetes mellitus with diabetic peripheral angiopathy without gangrene: Secondary | ICD-10-CM | POA: Diagnosis not present

## 2017-06-28 DIAGNOSIS — E11622 Type 2 diabetes mellitus with other skin ulcer: Secondary | ICD-10-CM | POA: Insufficient documentation

## 2017-06-28 DIAGNOSIS — I509 Heart failure, unspecified: Secondary | ICD-10-CM | POA: Insufficient documentation

## 2017-06-28 DIAGNOSIS — J449 Chronic obstructive pulmonary disease, unspecified: Secondary | ICD-10-CM | POA: Insufficient documentation

## 2017-06-28 DIAGNOSIS — Z7901 Long term (current) use of anticoagulants: Secondary | ICD-10-CM | POA: Diagnosis not present

## 2017-06-28 DIAGNOSIS — I87311 Chronic venous hypertension (idiopathic) with ulcer of right lower extremity: Secondary | ICD-10-CM | POA: Insufficient documentation

## 2017-06-28 DIAGNOSIS — I4891 Unspecified atrial fibrillation: Secondary | ICD-10-CM | POA: Insufficient documentation

## 2017-06-28 DIAGNOSIS — L97312 Non-pressure chronic ulcer of right ankle with fat layer exposed: Secondary | ICD-10-CM | POA: Diagnosis not present

## 2017-06-28 DIAGNOSIS — I872 Venous insufficiency (chronic) (peripheral): Secondary | ICD-10-CM | POA: Diagnosis not present

## 2017-06-28 DIAGNOSIS — L97919 Non-pressure chronic ulcer of unspecified part of right lower leg with unspecified severity: Secondary | ICD-10-CM | POA: Diagnosis not present

## 2017-06-28 DIAGNOSIS — L97311 Non-pressure chronic ulcer of right ankle limited to breakdown of skin: Secondary | ICD-10-CM | POA: Diagnosis not present

## 2017-07-02 DIAGNOSIS — N184 Chronic kidney disease, stage 4 (severe): Secondary | ICD-10-CM | POA: Diagnosis not present

## 2017-07-02 DIAGNOSIS — M109 Gout, unspecified: Secondary | ICD-10-CM | POA: Diagnosis not present

## 2017-07-02 DIAGNOSIS — I509 Heart failure, unspecified: Secondary | ICD-10-CM | POA: Diagnosis not present

## 2017-07-02 DIAGNOSIS — N189 Chronic kidney disease, unspecified: Secondary | ICD-10-CM | POA: Diagnosis not present

## 2017-07-02 DIAGNOSIS — D631 Anemia in chronic kidney disease: Secondary | ICD-10-CM | POA: Diagnosis not present

## 2017-07-02 DIAGNOSIS — E1122 Type 2 diabetes mellitus with diabetic chronic kidney disease: Secondary | ICD-10-CM | POA: Diagnosis not present

## 2017-07-02 DIAGNOSIS — I129 Hypertensive chronic kidney disease with stage 1 through stage 4 chronic kidney disease, or unspecified chronic kidney disease: Secondary | ICD-10-CM | POA: Diagnosis not present

## 2017-07-02 DIAGNOSIS — I4891 Unspecified atrial fibrillation: Secondary | ICD-10-CM | POA: Diagnosis not present

## 2017-07-02 DIAGNOSIS — E785 Hyperlipidemia, unspecified: Secondary | ICD-10-CM | POA: Diagnosis not present

## 2017-07-02 DIAGNOSIS — N2581 Secondary hyperparathyroidism of renal origin: Secondary | ICD-10-CM | POA: Diagnosis not present

## 2017-07-09 ENCOUNTER — Encounter: Payer: Self-pay | Admitting: Nurse Practitioner

## 2017-07-09 ENCOUNTER — Ambulatory Visit (INDEPENDENT_AMBULATORY_CARE_PROVIDER_SITE_OTHER): Payer: Medicare HMO | Admitting: Nurse Practitioner

## 2017-07-09 ENCOUNTER — Ambulatory Visit
Admission: RE | Admit: 2017-07-09 | Discharge: 2017-07-09 | Disposition: A | Discharge status: Home / Self Care | Payer: Medicare HMO | Source: Ambulatory Visit | Attending: Nurse Practitioner | Admitting: Nurse Practitioner

## 2017-07-09 VITALS — BP 136/74 | HR 74 | Temp 98.6°F | Ht 65.0 in | Wt 229.3 lb

## 2017-07-09 DIAGNOSIS — R05 Cough: Secondary | ICD-10-CM | POA: Diagnosis not present

## 2017-07-09 DIAGNOSIS — Z9989 Dependence on other enabling machines and devices: Secondary | ICD-10-CM

## 2017-07-09 DIAGNOSIS — I5032 Chronic diastolic (congestive) heart failure: Secondary | ICD-10-CM | POA: Diagnosis not present

## 2017-07-09 DIAGNOSIS — R0602 Shortness of breath: Secondary | ICD-10-CM

## 2017-07-09 DIAGNOSIS — N183 Chronic kidney disease, stage 3 (moderate): Secondary | ICD-10-CM | POA: Diagnosis not present

## 2017-07-09 DIAGNOSIS — G4733 Obstructive sleep apnea (adult) (pediatric): Secondary | ICD-10-CM

## 2017-07-09 DIAGNOSIS — N1832 Chronic kidney disease, stage 3b: Secondary | ICD-10-CM

## 2017-07-09 DIAGNOSIS — J449 Chronic obstructive pulmonary disease, unspecified: Secondary | ICD-10-CM | POA: Diagnosis not present

## 2017-07-09 MED ORDER — MOXIFLOXACIN HCL 400 MG PO TABS: 400.0000 mg | ORAL_TABLET | Freq: Every day | ORAL | 0 refills | Status: DC

## 2017-07-09 MED ORDER — ALBUTEROL SULFATE HFA 108 (90 BASE) MCG/ACT IN AERS
2.0000 | INHALATION_SPRAY | Freq: Four times a day (QID) | RESPIRATORY_TRACT | 2 refills | Status: DC | PRN
Start: 2017-07-09 — End: 2017-08-29

## 2017-07-09 NOTE — Progress Notes (Signed)
Careteam: Patient Care Team: Gayland Curry, DO as PCP - General (Geriatric Medicine) Troy Sine, MD as PCP - Cardiology (Cardiology) Pyrtle, Lajuan Lines, MD as Consulting Physician (Gastroenterology) Tanda Rockers, MD as Consulting Physician (Pulmonary Disease) Clent Jacks, MD as Consulting Physician (Ophthalmology) Gayland Curry, DO (Geriatric Medicine)  Advanced Directive information Does Patient Have a Medical Advance Directive?: Yes, Type of Advance Directive: Out of facility DNR (pink MOST or yellow form), Pre-existing out of facility DNR order (yellow form or pink MOST form): Yellow form placed in chart (order not valid for inpatient use)  No Known Allergies  Chief Complaint  Patient presents with  . Acute Visit    Pt is being seen due to SOB on and off for a few weeks. Pt reports that he is dizzy when he wakes up in the morning. He also reports being extremely nervous and shaking during the day.   . Other    Wife in room      HPI: Patient is a 77 y.o. male seen in the office today due to shortness of breath with wheezing. Reports he is coughing at night and now sleeping well. Has been on and off for the last few weeks.  Supposed to be sleeping with CPAP but has not been using it because it "does not do right." had been using [facility] in [place] but someone else here, unsure who they are using. Wife will call with name.  Reports occasional itchy eyes, denies runny nose. No sore throat. Productive cough, clear.  Having to sit up to sleep.  Reports swelling in legs has been up on right side only which is the leg that keeps more fluid.  Weight unchanged at 229 lb from last OV in April  Wife states RR has been up the last few days.   Review of Systems:  Review of Systems  Constitutional: Positive for malaise/fatigue. Negative for chills and fever.  HENT: Negative for congestion, ear discharge, ear pain and sore throat.   Eyes: Negative for blurred vision.    Respiratory: Positive for cough, sputum production (clear/white) and shortness of breath. Negative for wheezing.   Cardiovascular: Positive for leg swelling. Negative for chest pain and palpitations.  Gastrointestinal: Negative for abdominal pain, blood in stool, constipation and melena.  Genitourinary: Negative for dysuria.  Musculoskeletal: Negative for falls.  Skin: Negative for itching and rash.  Neurological: Negative for dizziness, loss of consciousness and weakness.  Endo/Heme/Allergies: Does not bruise/bleed easily.  Psychiatric/Behavioral: Negative for depression and memory loss. The patient is not nervous/anxious and does not have insomnia.      Past Medical History:  Diagnosis Date  . Acute kidney injury superimposed on chronic kidney disease ([place]) 11/2016  . Acute respiratory failure ([place]) 11/2016  . Allergy   . Asthma   . Atrial fibrillation ([place])   . Atrophic gastritis   . Benign essential hypertension   . CHF (congestive heart failure) ([place])   . COPD (chronic obstructive pulmonary disease) ([place])   . Diabetes ([place])   . Diabetes mellitus without complication ([place])   . GERD (gastroesophageal reflux disease)   . Gout attack 09/2012  . Sinusitis, maxillary, chronic   . Thyroid disease   . Type II or unspecified type diabetes mellitus without mention of complication, not stated as uncontrolled    Past Surgical History:  Procedure Laterality Date  . COLONOSCOPY  2003  . EYE SURGERY Left 05/06/2017   Cataract removed  . EYE  SURGERY Right 02/05/2013   Cataract removed  . HERNIA REPAIR  1980   Social History:   reports that he has quit smoking. His smoking use included cigarettes. He has a 20.00 pack-year smoking history. He has never used smokeless tobacco. He reports that he does not drink alcohol or use drugs.  Family History  Problem Relation Age of Onset  . Stroke Mother   . Cancer Sister   . Diabetes Sister   . COPD Sister   . COPD Sister      Medications: Patient's Medications  New Prescriptions   No medications on file  Previous Medications   ACETAMINOPHEN (TYLENOL) 325 MG TABLET    Take 650 mg by mouth every 6 (six) hours as needed for mild pain.   ALLOPURINOL (ZYLOPRIM) 300 MG TABLET    TAKE 1 TABLET BY MOUTH EVERY DAY   BACITRACIN OPHTHALMIC OINTMENT    Place 1 application into the right eye 2 (two) times daily.   KLOR-CON M20 20 MEQ TABLET    TAKE 1 TABLET BY MOUTH EVERY DAY   LEVOTHYROXINE (SYNTHROID, LEVOTHROID) 175 MCG TABLET    Take 175 mcg by mouth daily before breakfast.   LINAGLIPTIN (TRADJENTA) 5 MG TABS TABLET    Take 1 tablet (5 mg total) by mouth daily.   MONTELUKAST (SINGULAIR) 10 MG TABLET    Take one tablet by mouth at bedtime as needed for allergies and asthma   MULTIPLE VITAMINS-MINERALS (CENTRUM SILVER ADULT 50+ PO)    Take 1 tablet by mouth daily.   PANTOPRAZOLE (PROTONIX) 40 MG TABLET    TAKE 1 TABLET BY MOUTH EVERY DAY BEFORE SUPPER   ROSUVASTATIN (CRESTOR) 20 MG TABLET    Take 1 tablet (20 mg total) by mouth daily.   TORSEMIDE (DEMADEX) 20 MG TABLET    TAKE 1 TABLET TWICE A DAY   WARFARIN (COUMADIN) 5 MG TABLET    TAKE 1 TO 1.5 TABLETS BY MOUTH DAILY AS DIRECTED BY COUMADIN CLINIC  Modified Medications   No medications on file  Discontinued Medications   No medications on file     Physical Exam:  Vitals:   07/09/17 1301  BP: 136/74  Pulse: 74  Temp: 98.6 F (37 C)  TempSrc: Oral  SpO2: 96%  Weight: 229 lb 4.8 oz (104 kg)  Height: _0  (1.651 m)   Body mass index is 38.16 kg/m.  Physical Exam  Constitutional: He appears well-developed and well-nourished.  HENT:  Head: Normocephalic and atraumatic.  Mouth/Throat: Oropharynx is clear and moist.  Eyes: Pupils are equal, round, and reactive to light. EOM are normal.  Neck: Normal range of motion. Neck supple. JVD present.  Cardiovascular: Normal rate and normal heart sounds. An irregularly irregular rhythm present.   Pulmonary/Chest: Effort normal. Tachypnea (but without distress) noted. He has wheezes (right upper lobe).  Abdominal: Soft. Bowel sounds are normal.  Musculoskeletal: He exhibits edema (1+ pitting to right LE).  Neurological: He is alert.  Skin: Skin is warm and dry.  Psychiatric: He has a normal mood and affect.    Labs reviewed: Basic Metabolic Panel: Recent Labs    07/20/16 0935  10/31/16 1355  11/05/16 0356 11/06/16 0319 11/07/16 0313 11/08/16 0423 11/09/16 0621 02/18/17 0944  NA 144   < >  --    < > 147* 142 144 141 137 147*  K 4.1   < >  --    < > 3.7 3.7 4.0 3.6 3.6 4.1  CL 101   < >  --    < >  109 106 106 100* 103 103  CO2 27   < >  --    < > _0 GLUCOSE 111*   < >  --    < > 101* 99 90 88 71 72  BUN 59*   < >  --    < > 104* 55* 68* 63* 61* 28*  CREATININE 3.43*   < >  --    < > 5.36* 3.59* 4.12* 4.08* 4.31* 2.34*  CALCIUM 8.8   < >  --    < > 8.9 8.7* 9.0 8.6* 8.8* 8.9  MG  --    < >  --    < > 2.7* 2.2 2.4  --   --   --   PHOS  --    < >  --    < > 6.9* 5.0* 7.3* 5.0* 4.6  --   TSH 4.00  --  2.561  --   --   --   --   --   --  0.609   < > = values in this interval not displayed.   Liver Function Tests: Recent Labs    10/27/16 0025  11/06/16 0319 11/07/16 0313 11/09/16 0621 02/18/17 0944  AST 361*  --  72*  --   --  22  ALT 290*  --  183*  --   --  15  ALKPHOS 169*  --  185*  --   --  116  BILITOT 1.9*  --  2.4*  --   --  0.5  PROT 6.9  --  6.9  --   --  7.2  ALBUMIN 3.4*   < > 2.6*  2.6* 2.7* 2.8* 4.0   < > = values in this interval not displayed.   No results for input(s): LIPASE, AMYLASE in the last 8760 hours. No results for input(s): AMMONIA in the last 8760 hours. CBC: Recent Labs    11/02/16 0251 11/03/16 0512  11/07/16 0313 11/08/16 0423 02/18/17 0944  WBC 5.9 7.6   < > 10.8* 9.5 3.6  NEUTROABS 4.5 5.6  --   --   --  2.1  HGB 11.6* 11.5*   < > 11.5* 11.3* 10.4*  HCT 35.6* 35.1*   < > 35.4* 35.3* 32.5*  MCV 94.4 93.9    < > 97.5 96.7 96  PLT 144* 171   < > 284 359 219   < > = values in this interval not displayed.   Lipid Panel: Recent Labs    11/06/16 0319 02/18/17 0944  CHOL  --  214*  HDL  --  81  LDLCALC  --  124*  TRIG 88 46  CHOLHDL  --  2.6   TSH: Recent Labs    07/20/16 0935 10/31/16 1355 02/18/17 0944  TSH 4.00 2.561 0.609   A1C: Lab Results  Component Value Date   HGBA1C 5.6 10/28/2016     Assessment/Plan 1. Shortness of breath - Brain Natriuretic Peptide - CBC with Differential/Platelets - BMP with eGFR([facility]) - DG Chest 2 View rule out pneumonia vs CHF  2. Chronic kidney disease (CKD) stage G3b/A2, moderately decreased glomerular filtration rate (GFR) between 30-44 mL/min/1.73 square meter and albuminuria creatinine ratio between 30-299 mg/g (HCC) Will follow up labs at this time  3. COPD GOLD II -worsening shortness of breath, continues on singulair for COPD.  Faint wheezing noted on exam, will get chest xray at thist ime.  - albuterol (  PROVENTIL HFA;VENTOLIN HFA) 108 (90 Base) MCG/ACT inhaler; Inhale 2 puffs into the lungs every 6 (six) hours as needed for wheezing or shortness of breath.  Dispense: 1 Inhaler; Refill: 2  4. Chronic diastolic CHF (congestive heart failure), NYHA class 2 (HCC) Appears to have component of worsening heart failure. Weight up from January but stable from April.  Reports compliance with medication. Consulted with Dr Mariea Clonts, Will increase Demadex to TID today while awaiting results.    5. OSA on CPAP Will write for new CPAP, may need additional studies  Next appt: 07/18/2017 Janett Billow K. Harle Battiest  [facility] [phone]   Chest xray reveals pneumonia- avelox 400 mg by mouth daily x 7 days sent to pharmacy

## 2017-07-09 NOTE — Patient Instructions (Signed)
Increase Demadex to three times daily for today as we wait on results   Please call with information on CPAP

## 2017-07-10 LAB — BASIC METABOLIC PANEL WITH GFR
BUN/Creatinine Ratio: 18 (calc) (ref 6–22)
BUN: 39 mg/dL — ABNORMAL HIGH (ref 7–25)
CALCIUM: 9.2 mg/dL (ref 8.6–10.3)
CO2: 35 mmol/L — ABNORMAL HIGH (ref 20–32)
Chloride: 98 mmol/L (ref 98–110)
Creat: 2.11 mg/dL — ABNORMAL HIGH (ref 0.70–1.18)
GFR, EST AFRICAN AMERICAN: 34 mL/min/{1.73_m2} — AB (ref >=60)
GFR, EST NON AFRICAN AMERICAN: 29 mL/min/{1.73_m2} — AB (ref >=60)
Glucose, Bld: 83 mg/dL (ref 65–139)
Potassium: 3.9 mmol/L (ref 3.5–5.3)
Sodium: 141 mmol/L (ref 135–146)

## 2017-07-10 LAB — CBC WITH DIFFERENTIAL/PLATELET
BASOS ABS: 20 {cells}/uL (ref 0–200)
Basophils Relative: 0.2 %
EOS ABS: 41 {cells}/uL (ref 15–500)
Eosinophils Relative: 0.4 %
HEMATOCRIT: 35.6 % — AB (ref 38.5–50.0)
HEMOGLOBIN: 11.2 g/dL — AB (ref 13.2–17.1)
LYMPHS ABS: 561 {cells}/uL — AB (ref 850–3900)
MCH: 29.6 pg (ref 27.0–33.0)
MCHC: 31.5 g/dL — AB (ref 32.0–36.0)
MCV: 93.9 fL (ref 80.0–100.0)
MPV: 11.5 fL (ref 7.5–12.5)
Monocytes Relative: 5.7 %
NEUTROS ABS: 8996 {cells}/uL — AB (ref 1500–7800)
NEUTROS PCT: 88.2 %
Platelets: 184 10*3/uL (ref 140–400)
RBC: 3.79 10*6/uL — ABNORMAL LOW (ref 4.20–5.80)
RDW: 14.2 % (ref 11.0–15.0)
Total Lymphocyte: 5.5 %
WBC mixed population: 581 cells/uL (ref 200–950)
WBC: 10.2 10*3/uL (ref 3.8–10.8)

## 2017-07-10 LAB — BRAIN NATRIURETIC PEPTIDE: Brain Natriuretic Peptide: 544 pg/mL — ABNORMAL HIGH (ref <100)

## 2017-07-12 ENCOUNTER — Encounter (HOSPITAL_BASED_OUTPATIENT_CLINIC_OR_DEPARTMENT_OTHER): Payer: Medicare HMO | Attending: Internal Medicine

## 2017-07-15 ENCOUNTER — Ambulatory Visit (INDEPENDENT_AMBULATORY_CARE_PROVIDER_SITE_OTHER): Payer: Medicare HMO | Admitting: Pharmacist

## 2017-07-15 DIAGNOSIS — Z7901 Long term (current) use of anticoagulants: Secondary | ICD-10-CM

## 2017-07-15 DIAGNOSIS — I4891 Unspecified atrial fibrillation: Secondary | ICD-10-CM

## 2017-07-15 LAB — POCT INR: INR: 3.8 — AB (ref 2.0–3.0)

## 2017-07-15 NOTE — Patient Instructions (Addendum)
HOLD warfarin dose today 6/10 ONLY, then continue taking 1/2 tablet daily except for 1 tablet every Monday, Wednesday and Friday.  Repeat INR in 3 weeks. *CALL clinic at [phone] if new medication started*

## 2017-07-17 ENCOUNTER — Telehealth: Payer: Self-pay | Admitting: *Deleted

## 2017-07-17 NOTE — Telephone Encounter (Signed)
Patient wife, Earlie Server called and stated that we need to send a Rx for CPAP to [facility] [phone]. Stated that Janett Billow saw patient and ordered and asked for them to call back with the information.  Please Advise.

## 2017-07-17 NOTE — Telephone Encounter (Signed)
I sent an order for CPAP replacement machine and supplies to [facility] at fax number [phone].   Huey Romans main number: [phone]  CPAP line: [phone]  Resupply dept: [phone] option 2   I have spoke with Omar Lambert and he verbalized understanding.

## 2017-07-18 ENCOUNTER — Other Ambulatory Visit: Payer: Self-pay | Admitting: Cardiovascular Disease

## 2017-07-18 ENCOUNTER — Encounter: Payer: Medicare HMO | Admitting: Internal Medicine

## 2017-07-18 ENCOUNTER — Other Ambulatory Visit: Payer: Self-pay | Admitting: Internal Medicine

## 2017-07-18 NOTE — Telephone Encounter (Signed)
A fax was received from [facility] stating that the following information is needed to order CPAP for patient.   Pressure settings  Pap unit type: CPAP, BiPAP, BiPAP ST, BiPAP ASV  This information is not in the patient's chart so per Janett Billow, patient will need a new sleep study and referral to pulmonology.   Patient has declined to have another sleep study done. He stated that he would find another way to get what he needs.

## 2017-07-22 DIAGNOSIS — N189 Chronic kidney disease, unspecified: Secondary | ICD-10-CM | POA: Diagnosis not present

## 2017-07-29 ENCOUNTER — Encounter: Payer: Self-pay | Admitting: Internal Medicine

## 2017-07-29 ENCOUNTER — Ambulatory Visit
Admission: RE | Admit: 2017-07-29 | Discharge: 2017-07-29 | Disposition: A | Discharge status: Home / Self Care | Payer: Medicare HMO | Source: Ambulatory Visit | Attending: Internal Medicine | Admitting: Internal Medicine

## 2017-07-29 ENCOUNTER — Ambulatory Visit (INDEPENDENT_AMBULATORY_CARE_PROVIDER_SITE_OTHER): Payer: Medicare HMO | Admitting: Internal Medicine

## 2017-07-29 VITALS — BP 120/70 | HR 61 | Temp 98.5°F | Ht 65.0 in | Wt 224.0 lb

## 2017-07-29 DIAGNOSIS — I5033 Acute on chronic diastolic (congestive) heart failure: Secondary | ICD-10-CM | POA: Insufficient documentation

## 2017-07-29 DIAGNOSIS — Z8701 Personal history of pneumonia (recurrent): Secondary | ICD-10-CM

## 2017-07-29 DIAGNOSIS — Z0000 Encounter for general adult medical examination without abnormal findings: Secondary | ICD-10-CM | POA: Diagnosis not present

## 2017-07-29 DIAGNOSIS — N183 Chronic kidney disease, stage 3 unspecified: Secondary | ICD-10-CM

## 2017-07-29 DIAGNOSIS — I5032 Chronic diastolic (congestive) heart failure: Secondary | ICD-10-CM | POA: Diagnosis not present

## 2017-07-29 DIAGNOSIS — E1122 Type 2 diabetes mellitus with diabetic chronic kidney disease: Secondary | ICD-10-CM | POA: Diagnosis not present

## 2017-07-29 DIAGNOSIS — E039 Hypothyroidism, unspecified: Secondary | ICD-10-CM

## 2017-07-29 DIAGNOSIS — I87311 Chronic venous hypertension (idiopathic) with ulcer of right lower extremity: Secondary | ICD-10-CM | POA: Diagnosis not present

## 2017-07-29 DIAGNOSIS — J449 Chronic obstructive pulmonary disease, unspecified: Secondary | ICD-10-CM | POA: Diagnosis not present

## 2017-07-29 DIAGNOSIS — R0602 Shortness of breath: Secondary | ICD-10-CM | POA: Diagnosis not present

## 2017-07-29 DIAGNOSIS — I4819 Other persistent atrial fibrillation: Secondary | ICD-10-CM

## 2017-07-29 DIAGNOSIS — G250 Essential tremor: Secondary | ICD-10-CM

## 2017-07-29 DIAGNOSIS — I481 Persistent atrial fibrillation: Secondary | ICD-10-CM

## 2017-07-29 DIAGNOSIS — E1169 Type 2 diabetes mellitus with other specified complication: Secondary | ICD-10-CM

## 2017-07-29 DIAGNOSIS — E785 Hyperlipidemia, unspecified: Secondary | ICD-10-CM

## 2017-07-29 DIAGNOSIS — G4733 Obstructive sleep apnea (adult) (pediatric): Secondary | ICD-10-CM

## 2017-07-29 DIAGNOSIS — Z9989 Dependence on other enabling machines and devices: Secondary | ICD-10-CM

## 2017-07-29 DIAGNOSIS — N1832 Chronic kidney disease, stage 3b: Secondary | ICD-10-CM

## 2017-07-29 MED ORDER — PANTOPRAZOLE SODIUM 40 MG PO TBEC: DELAYED_RELEASE_TABLET | ORAL | 0 refills | Status: DC

## 2017-07-29 NOTE — Progress Notes (Signed)
Location:  [facility] clinic Provider:  Nikita Surman L. Mariea Clonts, D.O., C.M.D.  Code Status: DNR Goals of Care:  Advanced Directives 07/29/2017  Does Patient Have a Medical Advance Directive? Yes  Type of Advance Directive Out of facility DNR (pink MOST or yellow form)  Does patient want to make changes to medical advance directive? No - Patient declined  Copy of [place] in Chart? -  Would patient like information on creating a medical advance directive? -  Pre-existing out of facility DNR order (yellow form or pink MOST form) Yellow form placed in chart (order not valid for inpatient use)     Chief Complaint  Patient presents with  . Annual Exam    CPE    HPI: Patient is a 77 y.o. male seen today for CPE/annual exam.    He was seen on 07/09/17 for shortness of breath by NP E[place].  CXR showed:  Small right pleural effusion with right lower lobe pneumonia. Followup PA and lateral chest X-ray is recommended in 3-4 weeks following trial of antibiotic therapy to ensure resolution and exclude underlying malignancy. Cardiomegaly with mild pulmonary vascular congestion. No definite pulmonary edema. Thoracic aortic atherosclerosis.  He was treated with albuterol and moxifloxacin therapy.  His weight was stable at that visit.  He was not using his CPAP as directed due to it "not doing right". We attempted to get him CPAP thru [facility], but when pt was called to get new referral for sleep study b/c we do not have the information about his past sleep study results or settings, he refused it.  He thinks he got his current cpap working now.  He's willing to do another test if needed.    He does not think his pneumonia is fully resolved. When he's chewing, he has to stop and catch his breath.  He wakes up some mornings with a bad headache.    Dr. Jimmy Footman has sent him to GI for a colonoscopy due to his anemia and that is being set up.  Anemia has been improved recently per labs he's had  here.  Weight today is down 5 lbs from three weeks ago when he was here with pneumonia.  Appetite is ok.  He is getting weak in his legs.  He notices this when he goes to get up out of a chair (legs are stiff) and when he's walking.  No falls, but has come close.  Hands have been shaky.  He sees this if he goes to drink something.  Not anxious kind of nervous, just shaky in his hand.  He was too weak to look after his dog anymore and had to find someone else to look after him.  Past Medical History:  Diagnosis Date  . Acute kidney injury superimposed on chronic kidney disease ([place]) 11/2016  . Acute respiratory failure ([place]) 11/2016  . Allergy   . Asthma   . Atrial fibrillation ([place])   . Atrophic gastritis   . Benign essential hypertension   . CHF (congestive heart failure) ([place])   . COPD (chronic obstructive pulmonary disease) ([place])   . Diabetes ([place])   . Diabetes mellitus without complication ([place])   . GERD (gastroesophageal reflux disease)   . Gout attack 09/2012  . Sinusitis, maxillary, chronic   . Thyroid disease   . Type II or unspecified type diabetes mellitus without mention of complication, not stated as uncontrolled     Past Surgical History:  Procedure Laterality Date  . COLONOSCOPY  2003  . EYE SURGERY Left 05/06/2017   Cataract removed  . EYE SURGERY Right 02/05/2013   Cataract removed  . HERNIA REPAIR  1980    No Known Allergies  Outpatient Encounter Medications as of 07/29/2017  Medication Sig  . acetaminophen (TYLENOL) 325 MG tablet Take 650 mg by mouth every 6 (six) hours as needed for mild pain.  Marland Kitchen albuterol (PROVENTIL HFA;VENTOLIN HFA) 108 (90 Base) MCG/ACT inhaler Inhale 2 puffs into the lungs every 6 (six) hours as needed for wheezing or shortness of breath.  Marland Kitchen albuterol (PROVENTIL) (2.5 MG/3ML) 0.083% nebulizer solution INHALE CONTENTS OF 1 VIAL IN NEBULIZER EVERY 4 HOURS AS NEEDED FOR WHEEZING AND ASTHMA  . allopurinol (ZYLOPRIM) 300 MG tablet TAKE 1  TABLET BY MOUTH EVERY DAY  . calcitRIOL (ROCALTROL) 0.25 MCG capsule   . KLOR-CON M20 20 MEQ tablet TAKE 1 TABLET BY MOUTH EVERY DAY  . levothyroxine (SYNTHROID, LEVOTHROID) 175 MCG tablet Take 175 mcg by mouth daily before breakfast.  . linagliptin (TRADJENTA) 5 MG TABS tablet Take 1 tablet (5 mg total) by mouth daily.  Marland Kitchen moxifloxacin (AVELOX) 400 MG tablet Take 1 tablet (400 mg total) by mouth daily.  . Multiple Vitamins-Minerals (CENTRUM SILVER ADULT 50+ PO) Take 1 tablet by mouth daily.  . pantoprazole (PROTONIX) 40 MG tablet TAKE 1 TABLET BY MOUTH EVERY DAY BEFORE SUPPER  . rosuvastatin (CRESTOR) 20 MG tablet Take 1 tablet (20 mg total) by mouth daily.  Marland Kitchen torsemide (DEMADEX) 20 MG tablet TAKE 1 TABLET TWICE A DAY  . warfarin (COUMADIN) 5 MG tablet Take 1/2 to 1 tablet by mouth daily as directed by coumadin clinic  . [DISCONTINUED] pantoprazole (PROTONIX) 40 MG tablet TAKE 1 TABLET BY MOUTH EVERY DAY BEFORE SUPPER  . [DISCONTINUED] bacitracin ophthalmic ointment Place 1 application into the right eye 2 (two) times daily.  . [DISCONTINUED] montelukast (SINGULAIR) 10 MG tablet Take one tablet by mouth at bedtime as needed for allergies and asthma   No facility-administered encounter medications on file as of 07/29/2017.     Review of Systems:  Review of Systems  Constitutional: Negative for chills, fever and malaise/fatigue.  HENT: Positive for congestion.   Eyes: Negative for blurred vision.  Respiratory: Positive for cough and sputum production. Negative for shortness of breath.        White sputum  Cardiovascular: Negative for chest pain, palpitations and leg swelling.  Gastrointestinal: Negative for abdominal pain.  Genitourinary: Negative for dysuria.       Some incontinence  Musculoskeletal: Negative for falls and joint pain.  Skin:       Ankle wound still there  Neurological: Positive for tremors. Negative for dizziness.  Endo/Heme/Allergies: Does not bruise/bleed easily.    Psychiatric/Behavioral: Negative for memory loss. The patient is not nervous/anxious and does not have insomnia.     Health Maintenance  Topic Date Due  . URINE MICROALBUMIN  05/29/2016  . FOOT EXAM  03/02/2017  . HEMOGLOBIN A1C  04/27/2017  . INFLUENZA VACCINE  09/05/2017  . OPHTHALMOLOGY EXAM  02/07/2018  . TETANUS/TDAP  02/05/2021  . PNA vac Low Risk Adult  Completed    Physical Exam: Vitals:   07/29/17 0913  BP: 120/70  Pulse: 61  Temp: 98.5 F (36.9 C)  TempSrc: Oral  SpO2: 92%  Weight: 224 lb (101.6 kg)  Height: 5\' 5"  (1.651 m)   Body mass index is 37.28 kg/m. Physical Exam  Constitutional: He is oriented to person, place, and time. He appears  well-developed. No distress.  HENT:  Head: Normocephalic and atraumatic.  Right Ear: External ear normal.  Left Ear: External ear normal.  Nose: Nose normal.  Mouth/Throat: Oropharynx is clear and moist. No oropharyngeal exudate.  Eyes: Pupils are equal, round, and reactive to light. Conjunctivae and EOM are normal.  Neck: Normal range of motion. Neck supple. No JVD present. No tracheal deviation present. No thyromegaly present.  Cardiovascular:  irreg irreg  Pulmonary/Chest: Effort normal.  Still with some coarse rhonchi right greater than left  Abdominal: Soft. Bowel sounds are normal. He exhibits no distension. There is no tenderness.  Musculoskeletal: Normal range of motion.  Lymphadenopathy:    He has no cervical adenopathy.  Neurological: He is alert and oriented to person, place, and time.  Resting tremor of chin and action tremor of bilateral hands, left greater than right; no shuffling gait and no cogwheeling rigidity  Skin: Skin is warm and dry.  Venous ulcer remains very small, but does not heal  Psychiatric: He has a normal mood and affect.    Labs reviewed: Basic Metabolic Panel: Recent Labs    10/31/16 1355  11/05/16 0356 11/06/16 0319 11/07/16 0313 11/08/16 0423 11/09/16 0621 02/18/17 0944  07/09/17 1345  NA  --    < > 147* 142 144 141 137 147* 141  K  --    < > 3.7 3.7 4.0 3.6 3.6 4.1 3.9  CL  --    < > 109 106 106 100* 103 103 98  CO2  --    < > 25 27 25 26 24 26  35*  GLUCOSE  --    < > 101* 99 90 88 71 72 83  BUN  --    < > 104* 55* 68* 63* 61* 28* 39*  CREATININE  --    < > 5.36* 3.59* 4.12* 4.08* 4.31* 2.34* 2.11*  CALCIUM  --    < > 8.9 8.7* 9.0 8.6* 8.8* 8.9 9.2  MG  --    < > 2.7* 2.2 2.4  --   --   --   --   PHOS  --    < > 6.9* 5.0* 7.3* 5.0* 4.6  --   --   TSH 2.561  --   --   --   --   --   --  0.609  --    < > = values in this interval not displayed.   Liver Function Tests: Recent Labs    10/27/16 0025  11/06/16 0319 11/07/16 0313 11/09/16 0621 02/18/17 0944  AST 361*  --  72*  --   --  22  ALT 290*  --  183*  --   --  15  ALKPHOS 169*  --  185*  --   --  116  BILITOT 1.9*  --  2.4*  --   --  0.5  PROT 6.9  --  6.9  --   --  7.2  ALBUMIN 3.4*   < > 2.6*  2.6* 2.7* 2.8* 4.0   < > = values in this interval not displayed.   No results for input(s): LIPASE, AMYLASE in the last 8760 hours. No results for input(s): AMMONIA in the last 8760 hours. CBC: Recent Labs    11/03/16 0512  11/08/16 0423 02/18/17 0944 07/09/17 1345  WBC 7.6   < > 9.5 3.6 10.2  NEUTROABS 5.6  --   --  2.1 8,996*  HGB 11.5*   < > 11.3*  10.4* 11.2*  HCT 35.1*   < > 35.3* 32.5* 35.6*  MCV 93.9   < > 96.7 96 93.9  PLT 171   < > 359 219 184   < > = values in this interval not displayed.   Lipid Panel: Recent Labs    11/06/16 0319 02/18/17 0944  CHOL  --  214*  HDL  --  81  LDLCALC  --  124*  TRIG 88 46  CHOLHDL  --  2.6   Lab Results  Component Value Date   HGBA1C 5.6 10/28/2016    Procedures since last visit: Dg Chest 2 View  Result Date: 07/09/2017 CLINICAL DATA:  Cough and shortness of breath for the past several days. History of CHF and asthma-COPD. Former smoker. EXAM: CHEST - 2 VIEW COMPARISON:  Chest x-ray of November 06, 2016 FINDINGS: The left lung is  well-expanded and clear. On the right there is lower lobe infiltrate and small effusion. The cardiac silhouette is enlarged. The pulmonary vascularity is prominent centrally but there is no definite cephalization. There is calcification in the wall of the aortic arch. The bony thorax exhibits no acute abnormality. IMPRESSION: Small right pleural effusion with right lower lobe pneumonia. Followup PA and lateral chest X-ray is recommended in 3-4 weeks following trial of antibiotic therapy to ensure resolution and exclude underlying malignancy. Cardiomegaly with mild pulmonary vascular congestion. No definite pulmonary edema. Thoracic aortic atherosclerosis. Electronically Signed   By: David  [place]ique M.D.   On: 07/09/2017 16:31    Assessment/Plan Annual exam performed today.  1. Benign essential tremor -notable today, some action tremor and resting tremor of chin  2. Chronic venous hypertension (idiopathic) with ulcer of right lower extremity (CODE) (HCC) -cont to elevate feet at rest  3. Morbid obesity ([place]) -ongoing, but weight is down, still diabetic and has complications from his weight  4. COPD GOLD II -cont albuterol nebs, inhaler, apparently stopped his singulair - DG Chest 2 View  5. Chronic diastolic CHF (congestive heart failure), NYHA class 2 (HCC) -stable at this time, cont demadex, potassium  6. Persistent atrial fibrillation (HCC) -cont coumadin for anticoagulation, rate is controlled (has h/o bradycardia)  7. Type 2 diabetes mellitus with stage 3 chronic kidney disease, without long-term current use of insulin (HCC) - continue tradjenta therapy, f/u hba1c which has not been done by anyone else and check urine microalbumin as off ace/arb, followed by Dr. Jimmy Footman for his CKD - Microalbumin / creatinine urine ratio - Hemoglobin A1c  8. Hypothyroidism, unspecified type -cont levothyroxine 141mcg daily  9. Hyperlipidemia associated with type 2 diabetes mellitus ([place]) -cont  crestor therapy  10. Chronic kidney disease (CKD) stage G3b/A2, moderately decreased glomerular filtration rate (GFR) between 30-44 mL/min/1.73 square meter and albuminuria creatinine ratio between 30-299 mg/g (HCC) -cont to avoid nsaids, follow with nephrology -cont calcitriol  11. OSA on CPAP -cont cpap therapy--if still having headaches, will need retitration  12. History of pneumonia - f/u xray as recommended, but clinically seems better - DG Chest 2 View   Labs/tests ordered:   Orders Placed This Encounter  Procedures  . DG Chest 2 View    Order Specific Question:   Reason for Exam (SYMPTOM  OR DIAGNOSIS REQUIRED)    Answer:   follow up on RLL pneumonia    Order Specific Question:   Preferred imaging location?    Answer:   [facility]  . Microalbumin / creatinine urine ratio  . Hemoglobin A1c  Next appt:  4 mos med mgt, come fasting for labs  Zabdi Mis L. Mikaiya Tramble, D.O. [facility] [address], [place] [address] Cell Phone (Mon-Fri 8am-5pm):  [phone] On Call:  [phone] & follow prompts after 5pm & weekends Office Phone:  [phone] Office Fax:  [phone]

## 2017-07-30 LAB — HEMOGLOBIN A1C
Hgb A1c MFr Bld: 6 % of total Hgb — ABNORMAL HIGH (ref <5.7)
Mean Plasma Glucose: 126 (calc)
eAG (mmol/L): 7 (calc)

## 2017-07-30 LAB — MICROALBUMIN / CREATININE URINE RATIO
Creatinine, Urine: 81 mg/dL (ref 20–320)
Microalb Creat Ratio: 174 mcg/mg creat — ABNORMAL HIGH (ref <30)
Microalb, Ur: 14.1 mg/dL

## 2017-08-05 ENCOUNTER — Ambulatory Visit (INDEPENDENT_AMBULATORY_CARE_PROVIDER_SITE_OTHER): Payer: Medicare HMO | Admitting: Pharmacist

## 2017-08-05 DIAGNOSIS — I4891 Unspecified atrial fibrillation: Secondary | ICD-10-CM | POA: Diagnosis not present

## 2017-08-05 DIAGNOSIS — Z7901 Long term (current) use of anticoagulants: Secondary | ICD-10-CM | POA: Diagnosis not present

## 2017-08-05 LAB — POCT INR: INR: 3.6 — AB (ref 2.0–3.0)

## 2017-08-05 NOTE — Patient Instructions (Addendum)
HOLD warfarin dose today 08/05/17; then decrease dose to  continue taking 1/2 tablet daily except for 1 tablet everry Wednesday and Friday.  Repeat INR in 2 weeks. *CALL clinic at [phone] if new medication started*

## 2017-08-13 ENCOUNTER — Other Ambulatory Visit: Payer: Self-pay | Admitting: Cardiovascular Disease

## 2017-08-13 DIAGNOSIS — E785 Hyperlipidemia, unspecified: Principal | ICD-10-CM

## 2017-08-13 DIAGNOSIS — E1169 Type 2 diabetes mellitus with other specified complication: Secondary | ICD-10-CM

## 2017-08-13 NOTE — Telephone Encounter (Signed)
Rx sent to pharmacy   

## 2017-08-21 ENCOUNTER — Other Ambulatory Visit: Payer: Self-pay | Admitting: Internal Medicine

## 2017-08-29 ENCOUNTER — Other Ambulatory Visit: Payer: Self-pay

## 2017-08-29 ENCOUNTER — Inpatient Hospital Stay (HOSPITAL_COMMUNITY)
Admission: EM | Admit: 2017-08-29 | Discharge: 2017-09-01 | DRG: 291 | Disposition: A | Discharge status: Home / Self Care | Payer: Medicare HMO | Attending: Internal Medicine | Admitting: Internal Medicine

## 2017-08-29 ENCOUNTER — Inpatient Hospital Stay (HOSPITAL_COMMUNITY): Payer: Medicare HMO

## 2017-08-29 ENCOUNTER — Emergency Department (HOSPITAL_COMMUNITY): Payer: Medicare HMO

## 2017-08-29 ENCOUNTER — Encounter (HOSPITAL_COMMUNITY): Payer: Self-pay

## 2017-08-29 DIAGNOSIS — Z7951 Long term (current) use of inhaled steroids: Secondary | ICD-10-CM

## 2017-08-29 DIAGNOSIS — R9431 Abnormal electrocardiogram [ECG] [EKG]: Secondary | ICD-10-CM

## 2017-08-29 DIAGNOSIS — I481 Persistent atrial fibrillation: Secondary | ICD-10-CM | POA: Diagnosis not present

## 2017-08-29 DIAGNOSIS — I4892 Unspecified atrial flutter: Secondary | ICD-10-CM | POA: Diagnosis present

## 2017-08-29 DIAGNOSIS — N184 Chronic kidney disease, stage 4 (severe): Secondary | ICD-10-CM | POA: Diagnosis present

## 2017-08-29 DIAGNOSIS — I361 Nonrheumatic tricuspid (valve) insufficiency: Secondary | ICD-10-CM

## 2017-08-29 DIAGNOSIS — I5082 Biventricular heart failure: Secondary | ICD-10-CM | POA: Diagnosis not present

## 2017-08-29 DIAGNOSIS — E876 Hypokalemia: Secondary | ICD-10-CM | POA: Diagnosis present

## 2017-08-29 DIAGNOSIS — I5033 Acute on chronic diastolic (congestive) heart failure: Secondary | ICD-10-CM | POA: Diagnosis not present

## 2017-08-29 DIAGNOSIS — E1122 Type 2 diabetes mellitus with diabetic chronic kidney disease: Secondary | ICD-10-CM | POA: Diagnosis present

## 2017-08-29 DIAGNOSIS — R791 Abnormal coagulation profile: Secondary | ICD-10-CM

## 2017-08-29 DIAGNOSIS — Z87891 Personal history of nicotine dependence: Secondary | ICD-10-CM | POA: Diagnosis not present

## 2017-08-29 DIAGNOSIS — M109 Gout, unspecified: Secondary | ICD-10-CM | POA: Diagnosis present

## 2017-08-29 DIAGNOSIS — Z9119 Patient's noncompliance with other medical treatment and regimen: Secondary | ICD-10-CM | POA: Diagnosis not present

## 2017-08-29 DIAGNOSIS — I248 Other forms of acute ischemic heart disease: Secondary | ICD-10-CM | POA: Diagnosis present

## 2017-08-29 DIAGNOSIS — D689 Coagulation defect, unspecified: Secondary | ICD-10-CM | POA: Diagnosis not present

## 2017-08-29 DIAGNOSIS — R0603 Acute respiratory distress: Secondary | ICD-10-CM | POA: Diagnosis not present

## 2017-08-29 DIAGNOSIS — Z7901 Long term (current) use of anticoagulants: Secondary | ICD-10-CM

## 2017-08-29 DIAGNOSIS — G4733 Obstructive sleep apnea (adult) (pediatric): Secondary | ICD-10-CM | POA: Diagnosis present

## 2017-08-29 DIAGNOSIS — I130 Hypertensive heart and chronic kidney disease with heart failure and stage 1 through stage 4 chronic kidney disease, or unspecified chronic kidney disease: Secondary | ICD-10-CM | POA: Diagnosis not present

## 2017-08-29 DIAGNOSIS — L97909 Non-pressure chronic ulcer of unspecified part of unspecified lower leg with unspecified severity: Secondary | ICD-10-CM

## 2017-08-29 DIAGNOSIS — K219 Gastro-esophageal reflux disease without esophagitis: Secondary | ICD-10-CM | POA: Diagnosis present

## 2017-08-29 DIAGNOSIS — Z9989 Dependence on other enabling machines and devices: Secondary | ICD-10-CM | POA: Diagnosis not present

## 2017-08-29 DIAGNOSIS — N289 Disorder of kidney and ureter, unspecified: Secondary | ICD-10-CM

## 2017-08-29 DIAGNOSIS — E785 Hyperlipidemia, unspecified: Secondary | ICD-10-CM | POA: Diagnosis present

## 2017-08-29 DIAGNOSIS — E11622 Type 2 diabetes mellitus with other skin ulcer: Secondary | ICD-10-CM | POA: Diagnosis present

## 2017-08-29 DIAGNOSIS — I4819 Other persistent atrial fibrillation: Secondary | ICD-10-CM | POA: Diagnosis present

## 2017-08-29 DIAGNOSIS — R748 Abnormal levels of other serum enzymes: Secondary | ICD-10-CM

## 2017-08-29 DIAGNOSIS — L97919 Non-pressure chronic ulcer of unspecified part of right lower leg with unspecified severity: Secondary | ICD-10-CM | POA: Diagnosis present

## 2017-08-29 DIAGNOSIS — I10 Essential (primary) hypertension: Secondary | ICD-10-CM | POA: Diagnosis not present

## 2017-08-29 DIAGNOSIS — R7989 Other specified abnormal findings of blood chemistry: Secondary | ICD-10-CM | POA: Diagnosis not present

## 2017-08-29 DIAGNOSIS — N183 Chronic kidney disease, stage 3 unspecified: Secondary | ICD-10-CM | POA: Diagnosis present

## 2017-08-29 DIAGNOSIS — E039 Hypothyroidism, unspecified: Secondary | ICD-10-CM | POA: Diagnosis present

## 2017-08-29 DIAGNOSIS — Z66 Do not resuscitate: Secondary | ICD-10-CM | POA: Diagnosis present

## 2017-08-29 DIAGNOSIS — E1129 Type 2 diabetes mellitus with other diabetic kidney complication: Secondary | ICD-10-CM | POA: Diagnosis present

## 2017-08-29 DIAGNOSIS — J449 Chronic obstructive pulmonary disease, unspecified: Secondary | ICD-10-CM | POA: Diagnosis present

## 2017-08-29 DIAGNOSIS — Z79899 Other long term (current) drug therapy: Secondary | ICD-10-CM

## 2017-08-29 DIAGNOSIS — I110 Hypertensive heart disease with heart failure: Secondary | ICD-10-CM | POA: Diagnosis not present

## 2017-08-29 DIAGNOSIS — R778 Other specified abnormalities of plasma proteins: Secondary | ICD-10-CM

## 2017-08-29 DIAGNOSIS — D539 Nutritional anemia, unspecified: Secondary | ICD-10-CM

## 2017-08-29 DIAGNOSIS — Z6836 Body mass index (BMI) 36.0-36.9, adult: Secondary | ICD-10-CM | POA: Diagnosis not present

## 2017-08-29 DIAGNOSIS — R05 Cough: Secondary | ICD-10-CM | POA: Diagnosis not present

## 2017-08-29 DIAGNOSIS — I509 Heart failure, unspecified: Secondary | ICD-10-CM | POA: Insufficient documentation

## 2017-08-29 DIAGNOSIS — R0602 Shortness of breath: Secondary | ICD-10-CM | POA: Diagnosis not present

## 2017-08-29 HISTORY — DX: Chronic kidney disease, stage 3 (moderate): N18.3

## 2017-08-29 HISTORY — DX: Chronic kidney disease, stage 3 unspecified: N18.30

## 2017-08-29 HISTORY — DX: Dyspnea, unspecified: R06.00

## 2017-08-29 LAB — CBC
HCT: 36.9 % — ABNORMAL LOW (ref 39.0–52.0)
Hemoglobin: 10.9 g/dL — ABNORMAL LOW (ref 13.0–17.0)
MCH: 30.4 pg (ref 26.0–34.0)
MCHC: 29.5 g/dL — ABNORMAL LOW (ref 30.0–36.0)
MCV: 102.8 fL — AB (ref 78.0–100.0)
PLATELETS: 151 10*3/uL (ref 150–400)
RBC: 3.59 MIL/uL — AB (ref 4.22–5.81)
RDW: 18.3 % — AB (ref 11.5–15.5)
WBC: 4.6 10*3/uL (ref 4.0–10.5)

## 2017-08-29 LAB — BASIC METABOLIC PANEL
ANION GAP: 10 (ref 5–15)
BUN: 33 mg/dL — AB (ref 8–23)
CALCIUM: 9.4 mg/dL (ref 8.9–10.3)
CO2: 37 mmol/L — ABNORMAL HIGH (ref 22–32)
Chloride: 96 mmol/L — ABNORMAL LOW (ref 98–111)
Creatinine, Ser: 2.3 mg/dL — ABNORMAL HIGH (ref 0.61–1.24)
GFR calc Af Amer: 30 mL/min — ABNORMAL LOW (ref >60)
GFR, EST NON AFRICAN AMERICAN: 26 mL/min — AB (ref >60)
GLUCOSE: 102 mg/dL — AB (ref 70–99)
POTASSIUM: 3.3 mmol/L — AB (ref 3.5–5.1)
Sodium: 143 mmol/L (ref 135–145)

## 2017-08-29 LAB — GLUCOSE, CAPILLARY
GLUCOSE-CAPILLARY: 75 mg/dL (ref 70–99)
GLUCOSE-CAPILLARY: 37 mg/dL — AB (ref 70–99)
GLUCOSE-CAPILLARY: 26 mg/dL — AB (ref 70–99)
GLUCOSE-CAPILLARY: 59 mg/dL — AB (ref 70–99)
GLUCOSE-CAPILLARY: 98 mg/dL (ref 70–99)
GLUCOSE-CAPILLARY: 81 mg/dL (ref 70–99)
Glucose-Capillary: 85 mg/dL (ref 70–99)
Glucose-Capillary: 89 mg/dL (ref 70–99)
Glucose-Capillary: 96 mg/dL (ref 70–99)

## 2017-08-29 LAB — PROTIME-INR
INR: 1.82
Prothrombin Time: 20.9 seconds — ABNORMAL HIGH (ref 11.4–15.2)

## 2017-08-29 LAB — TROPONIN I
Troponin I: 0.05 ng/mL (ref <0.03)
Troponin I: 0.04 ng/mL (ref <0.03)

## 2017-08-29 LAB — ECHOCARDIOGRAM COMPLETE
Height: 65 in
Weight: 3529.6 oz

## 2017-08-29 LAB — BRAIN NATRIURETIC PEPTIDE: B Natriuretic Peptide: 438.9 pg/mL — ABNORMAL HIGH (ref 0.0–100.0)

## 2017-08-29 LAB — MAGNESIUM: Magnesium: 2 mg/dL (ref 1.7–2.4)

## 2017-08-29 MED ORDER — POTASSIUM CHLORIDE CRYS ER 20 MEQ PO TBCR
40.0000 meq | EXTENDED_RELEASE_TABLET | Freq: Once | ORAL | Status: AC
  Administered 2017-08-29: 40 meq via ORAL
  Filled 2017-08-29: qty 2

## 2017-08-29 MED ORDER — IPRATROPIUM-ALBUTEROL 0.5-2.5 (3) MG/3ML IN SOLN
3.0000 mL | Freq: Two times a day (BID) | RESPIRATORY_TRACT | Status: DC
  Administered 2017-08-29 – 2017-08-31 (×4): 3 mL via RESPIRATORY_TRACT
  Filled 2017-08-29 (×4): qty 3

## 2017-08-29 MED ORDER — IPRATROPIUM-ALBUTEROL 0.5-2.5 (3) MG/3ML IN SOLN: 3.0000 mL | Freq: Four times a day (QID) | RESPIRATORY_TRACT | Status: DC

## 2017-08-29 MED ORDER — DEXTROSE 50 % IV SOLN
INTRAVENOUS | Status: AC
  Administered 2017-08-29: 17:00:00
  Filled 2017-08-29: qty 50

## 2017-08-29 MED ORDER — IPRATROPIUM-ALBUTEROL 0.5-2.5 (3) MG/3ML IN SOLN
3.0000 mL | Freq: Four times a day (QID) | RESPIRATORY_TRACT | Status: DC
  Administered 2017-08-29: 3 mL via RESPIRATORY_TRACT
  Filled 2017-08-29: qty 3

## 2017-08-29 MED ORDER — POTASSIUM CHLORIDE CRYS ER 20 MEQ PO TBCR
20.0000 meq | EXTENDED_RELEASE_TABLET | Freq: Every day | ORAL | Status: DC
Start: 2017-08-29 — End: 2017-09-01
  Administered 2017-08-29 – 2017-09-01 (×4): 20 meq via ORAL
  Filled 2017-08-29 (×4): qty 1

## 2017-08-29 MED ORDER — ACETAMINOPHEN 325 MG PO TABS
650.0000 mg | ORAL_TABLET | ORAL | Status: DC | PRN
  Administered 2017-08-30 – 2017-08-31 (×2): 650 mg via ORAL
  Filled 2017-08-29 (×2): qty 2

## 2017-08-29 MED ORDER — DEXTROSE 50 % IV SOLN
INTRAVENOUS | Status: AC
  Filled 2017-08-29: qty 50

## 2017-08-29 MED ORDER — DEXTROSE 10 % IV SOLN
INTRAVENOUS | Status: DC
  Administered 2017-08-29: 19:00:00 via INTRAVENOUS

## 2017-08-29 MED ORDER — SODIUM CHLORIDE 0.9% FLUSH: 3.0000 mL | INTRAVENOUS | Status: DC | PRN

## 2017-08-29 MED ORDER — SODIUM CHLORIDE 0.9% FLUSH
3.0000 mL | Freq: Two times a day (BID) | INTRAVENOUS | Status: DC
  Administered 2017-08-30 – 2017-09-01 (×4): 3 mL via INTRAVENOUS

## 2017-08-29 MED ORDER — SODIUM CHLORIDE 0.9 % IV SOLN: 250.0000 mL | INTRAVENOUS | Status: DC | PRN

## 2017-08-29 MED ORDER — FUROSEMIDE 10 MG/ML IJ SOLN
40.0000 mg | Freq: Two times a day (BID) | INTRAMUSCULAR | Status: DC
  Administered 2017-08-29 – 2017-08-31 (×4): 40 mg via INTRAVENOUS
  Filled 2017-08-29 (×5): qty 4

## 2017-08-29 MED ORDER — WARFARIN SODIUM 5 MG PO TABS
5.0000 mg | ORAL_TABLET | Freq: Once | ORAL | Status: AC
  Administered 2017-08-29: 5 mg via ORAL
  Filled 2017-08-29: qty 1

## 2017-08-29 MED ORDER — ROSUVASTATIN CALCIUM 20 MG PO TABS
20.0000 mg | ORAL_TABLET | Freq: Every day | ORAL | Status: DC
  Administered 2017-08-29 – 2017-09-01 (×4): 20 mg via ORAL
  Filled 2017-08-29 (×5): qty 1

## 2017-08-29 MED ORDER — DEXTROSE 50 % IV SOLN
1.0000 | Freq: Once | INTRAVENOUS | Status: AC
  Administered 2017-08-29: 50 mL via INTRAVENOUS

## 2017-08-29 MED ORDER — WARFARIN - PHARMACIST DOSING INPATIENT: Freq: Every day | Status: DC

## 2017-08-29 MED ORDER — CALCITRIOL 0.25 MCG PO CAPS
0.2500 ug | ORAL_CAPSULE | Freq: Every day | ORAL | Status: DC
  Administered 2017-08-29 – 2017-09-01 (×4): 0.25 ug via ORAL
  Filled 2017-08-29 (×4): qty 1

## 2017-08-29 MED ORDER — FUROSEMIDE 10 MG/ML IJ SOLN
40.0000 mg | Freq: Once | INTRAMUSCULAR | Status: AC
  Administered 2017-08-29: 40 mg via INTRAVENOUS
  Filled 2017-08-29: qty 4

## 2017-08-29 MED ORDER — ALLOPURINOL 300 MG PO TABS
300.0000 mg | ORAL_TABLET | Freq: Every day | ORAL | Status: DC
  Administered 2017-08-29 – 2017-09-01 (×4): 300 mg via ORAL
  Filled 2017-08-29 (×4): qty 1

## 2017-08-29 MED ORDER — ONDANSETRON HCL 4 MG/2ML IJ SOLN: 4.0000 mg | Freq: Four times a day (QID) | INTRAMUSCULAR | Status: DC | PRN

## 2017-08-29 MED ORDER — LEVOTHYROXINE SODIUM 75 MCG PO TABS
175.0000 ug | ORAL_TABLET | Freq: Every day | ORAL | Status: DC
  Administered 2017-08-29 – 2017-09-01 (×4): 175 ug via ORAL
  Filled 2017-08-29 (×4): qty 1

## 2017-08-29 NOTE — Progress Notes (Signed)
  Echocardiogram 2D Echocardiogram has been performed.  Darlina Sicilian M 08/29/2017, 1:05 PM

## 2017-08-29 NOTE — Progress Notes (Signed)
Inpatient Diabetes Program Recommendations  AACE/ADA: New Consensus Statement on Inpatient Glycemic Control (2015)  Target Ranges:  Prepandial:   less than 140 mg/dL      Peak postprandial:   less than 180 mg/dL (1-2 hours)      Critically ill patients:  140 - 180 mg/dL   Lab Results  Component Value Date   GLUCAP 59 (L) 08/29/2017   HGBA1C 6.0 (H) 07/29/2017    Review of Glycemic Control  Diabetes history: DM2 Outpatient Diabetes medications: tradjenta 5 mg QD Current orders for Inpatient glycemic control: None  HgbA1C - 6.0% Hypoglycemia noted.   Inpatient Diabetes Program Recommendations:    CBGs tidwc Consider d/c tradjenta home med and f/u with PCP who manages his DM.  Continue to follow.  Thank you. Lorenda Peck, RD, LDN, CDE Inpatient Diabetes Coordinator [phone]

## 2017-08-29 NOTE — Progress Notes (Signed)
Patient sitting chair talking with MD, Blood sugar in the30s. Gave OJ and gram crackers with peanut butter. Paging attending to make aware.

## 2017-08-29 NOTE — Progress Notes (Signed)
ANTICOAGULATION CONSULT NOTE - Initial Consult  Pharmacy Consult for warfarin dosing Indication: atrial fibrillation  No Known Allergies  Patient Measurements: Height: 5\' 5"  (165.1 cm) Weight: 220 lb 9.6 oz (100.1 kg) IBW/kg (Calculated) : 61.5  Vital Signs: Temp: 97.8 F (36.6 C) (07/25 0758) Temp Source: Oral (07/25 0758) BP: 112/85 (07/25 0758) Pulse Rate: 69 (07/25 0913)  Labs: Recent Labs    08/29/17 0306 08/29/17 0428  HGB 10.9*  --   HCT 36.9*  --   PLT 151  --   LABPROT  --  20.9*  INR  --  1.82  CREATININE 2.30*  --   TROPONINI  --  0.05*    Estimated Creatinine Clearance: 28.8 mL/min (A) (by C-G formula based on SCr of 2.3 mg/dL (H)).   Medical History: Past Medical History:  Diagnosis Date  . Acute kidney injury superimposed on chronic kidney disease ([place]) 11/2016  . Acute respiratory failure ([place]) 11/2016  . Allergy   . Asthma   . Atrial fibrillation ([place])   . Atrophic gastritis   . Benign essential hypertension   . CHF (congestive heart failure) ([place])   . CKD (chronic kidney disease), stage III ([place])   . COPD (chronic obstructive pulmonary disease) ([place])   . Diabetes ([place])   . Diabetes mellitus without complication ([place])   . Dyspnea   . GERD (gastroesophageal reflux disease)   . Gout attack 09/2012  . Sinusitis, maxillary, chronic   . Thyroid disease   . Type II or unspecified type diabetes mellitus without mention of complication, not stated as uncontrolled     Medications:  Medications Prior to Admission  Medication Sig Dispense Refill Last Dose  . acetaminophen (TYLENOL) 325 MG tablet Take 650 mg by mouth every 6 (six) hours as needed for mild pain.   Past Week at Unknown time  . albuterol (PROVENTIL) (2.5 MG/3ML) 0.083% nebulizer solution INHALE CONTENTS OF 1 VIAL IN NEBULIZER EVERY 4 HOURS AS NEEDED FOR WHEEZING AND ASTHMA 75 mL 4 Past Week at Unknown time  . allopurinol (ZYLOPRIM) 300 MG tablet TAKE 1 TABLET BY MOUTH EVERY DAY 30  tablet 6 08/28/2017 at Unknown time  . calcitRIOL (ROCALTROL) 0.25 MCG capsule Take 0.25 mcg by mouth daily.    08/28/2017 at Unknown time  . KLOR-CON M20 20 MEQ tablet TAKE 1 TABLET BY MOUTH EVERY DAY 30 tablet 1 08/28/2017 at Unknown time  . levothyroxine (SYNTHROID, LEVOTHROID) 175 MCG tablet TAKE 1 TABLET EVERY DAY BEFORE BREAKFAST 30 tablet 3 08/28/2017 at Unknown time  . linagliptin (TRADJENTA) 5 MG TABS tablet Take 1 tablet (5 mg total) by mouth daily. 90 tablet 3 08/28/2017 at Unknown time  . Multiple Vitamins-Minerals (CENTRUM SILVER ADULT 50+ PO) Take 1 tablet by mouth daily.   08/28/2017 at Unknown time  . pantoprazole (PROTONIX) 40 MG tablet TAKE 1 TABLET BY MOUTH EVERY DAY BEFORE SUPPER 90 tablet 0 08/28/2017 at Unknown time  . rosuvastatin (CRESTOR) 20 MG tablet Take 1 tablet (20 mg total) by mouth daily. Need OV for further refills 30 tablet 2 08/28/2017 at Unknown time  . torsemide (DEMADEX) 20 MG tablet TAKE 1 TABLET TWICE A DAY 60 tablet 3 08/28/2017 at Unknown time  . warfarin (COUMADIN) 5 MG tablet Take 1/2 to 1 tablet by mouth daily as directed by coumadin clinic (Patient taking differently: Take 5 mg by mouth daily. ) 30 tablet 2 08/28/2017 at 0900    Assessment: 77 yo male on warfarin for a fib presented  with worsening shortness of breath, edema, and weight gain.   Patient reports taking 5mg  M,W,F and 2.5mg  STThSa. However, the most recent clinic visit has 2.5mg  WF and 2.5 SMTThSa. Patient reports taking 2.5mg  yesterday (7/24).   Patient's INR subtherapeutic at 1.86. Patient endorses no signs or symptoms of bleeding. Patient has no questions about his warfarin at this time.   Goal of Therapy:  INR 2-3 Monitor platelets by anticoagulation protocol: Yes   Plan:  Give warfarin 5mg  today Daily INR and CBC    Omar Lambert, Pharm D PGY1 Pharmacy Resident  Please check amion for the clinical pharmacist number 08/29/2017      10:24 AM

## 2017-08-29 NOTE — Progress Notes (Signed)
Patient sitting on the side of the bed with no assistance needed no distress.

## 2017-08-29 NOTE — H&P (Signed)
History and Physical    Omar Lambert:[id number] DOB: Feb 12, 1940 DOA: 08/29/2017  PCP: Gayland Curry, DO Patient coming from: home  Chief Complaint: sob  HPI: Omar Lambert is a 77 y.o. male with medical history significant for diabetes, hypertension, hyperlipidemia, COPD not on home oxygen, CKD, CHF, atrial fibrillation taking Coumadin presents to the emergency department with the chief complaint of persistent worsening shortness of breath. Initial evaluation reveals respiratory distress likely related to acute on chronic diastolic heart failure. [facility] are asked to admit  Information is obtained from the patient and his wife who is at the bedside. Wife states that over the last 45 weeks patient has experienced worsening shortness of breath with dyspnea as well as  LE edema and Weight gain. He does not own a scale so he does not weigh himself. She reports his abdominal girth will increase and decrease as well as his lower extremity edema. Patient states he'll take "an extra pill" when his swelling worsens. Associated symptoms include intermittent headache upon awakening cough with minor thick white sputum production. He denies chest pain palpitations abdominal pain nausea vomiting. He denies fever chills dysuria hematuria frequency or urgency. He denies diarrhea constipation melena bright red blood per rectum. He reports compliance with his medications wife says there may be some dietary indiscretions.   ED Course: in the emergency department he is afebrile hemodynamically stable with tachypnea. Oxygen saturation level 91% on room air. He is provided with Lasix 40 mg IV. He is also provided with oral potassium for mild hypokalemia  Review of Systems: As per HPI otherwise all other systems reviewed and are negative.   Ambulatory Status: ambulates independently and is independent with ADLs  Past Medical History:  Diagnosis Date  . Acute kidney injury superimposed on chronic  kidney disease ([place]) 11/2016  . Acute respiratory failure ([place]) 11/2016  . Allergy   . Asthma   . Atrial fibrillation ([place])   . Atrophic gastritis   . Benign essential hypertension   . CHF (congestive heart failure) ([place])   . COPD (chronic obstructive pulmonary disease) ([place])   . Diabetes ([place])   . Diabetes mellitus without complication ([place])   . Dyspnea   . GERD (gastroesophageal reflux disease)   . Gout attack 09/2012  . Sinusitis, maxillary, chronic   . Thyroid disease   . Type II or unspecified type diabetes mellitus without mention of complication, not stated as uncontrolled     Past Surgical History:  Procedure Laterality Date  . COLONOSCOPY  2003  . EYE SURGERY Left 05/06/2017   Cataract removed  . EYE SURGERY Right 02/05/2013   Cataract removed  . HERNIA REPAIR  1980    Social History   Socioeconomic History  . Marital status: Married    Spouse name: Not on file  . Number of children: 3  . Years of education: Not on file  . Highest education level: Not on file  Occupational History  . Occupation: Retired  Scientific laboratory technician  . Financial resource strain: Not hard at all  . Food insecurity:    Worry: Never true    Inability: Never true  . Transportation needs:    Medical: No    Non-medical: No  Tobacco Use  . Smoking status: Former Smoker    Packs/day: 1.00    Years: 20.00    Pack years: 20.00    Types: Cigarettes  . Smokeless tobacco: Never Used  . Tobacco comment: [place]  Substance and Sexual Activity  . Alcohol use: No  . Drug use: No  . Sexual activity: Not Currently  Lifestyle  . Physical activity:    Days per week: 0 days    Minutes per session: 0 min  . Stress: Not at all  Relationships  . Social connections:    Talks on phone: Once a week    Gets together: Twice a week    Attends religious service: More than 4 times per year    Active member of club or organization: No    Attends meetings of clubs or organizations: Never     Relationship status: Married  . Intimate partner violence:    Fear of current or ex partner: No    Emotionally abused: No    Physically abused: No    Forced sexual activity: No  Other Topics Concern  . Not on file  Social History Narrative   ** Merged History Encounter **       Lives in 1 story apt with his wife, Omar Lambert.  Sometimes exercises.  Drinks coffee.      No Known Allergies  Family History  Problem Relation Age of Onset  . Stroke Mother   . Cancer Sister   . Diabetes Sister   . COPD Sister   . COPD Sister     Prior to Admission medications   Medication Sig Start Date End Date Taking? Authorizing Provider  acetaminophen (TYLENOL) 325 MG tablet Take 650 mg by mouth every 6 (six) hours as needed for mild pain.   Yes [provider]  albuterol (PROVENTIL) (2.5 MG/3ML) 0.083% nebulizer solution INHALE CONTENTS OF 1 VIAL IN NEBULIZER EVERY 4 HOURS AS NEEDED FOR WHEEZING AND ASTHMA 07/18/17  Yes [name], [name] L, DO  allopurinol (ZYLOPRIM) 300 MG tablet TAKE 1 TABLET BY MOUTH EVERY DAY 06/19/17  Yes [name], [name] L, DO  calcitRIOL (ROCALTROL) 0.25 MCG capsule Take 0.25 mcg by mouth daily.  07/24/17  Yes [provider]  KLOR-CON M20 20 MEQ tablet TAKE 1 TABLET BY MOUTH EVERY DAY 04/09/16  Yes [name], [name] L, DO  levothyroxine (SYNTHROID, LEVOTHROID) 175 MCG tablet TAKE 1 TABLET EVERY DAY BEFORE BREAKFAST 08/21/17  Yes [name], [name] L, DO  linagliptin (TRADJENTA) 5 MG TABS tablet Take 1 tablet (5 mg total) by mouth daily. 09/30/15  Yes [name], [name] L, DO  Multiple Vitamins-Minerals (CENTRUM SILVER ADULT 50+ PO) Take 1 tablet by mouth daily.   Yes [provider]  pantoprazole (PROTONIX) 40 MG tablet TAKE 1 TABLET BY MOUTH EVERY DAY BEFORE SUPPER 07/29/17  Yes [name], [name] L, DO  rosuvastatin (CRESTOR) 20 MG tablet Take 1 tablet (20 mg total) by mouth daily. Need OV for further refills 08/13/17  Yes Troy Sine, MD  torsemide (DEMADEX) 20 MG tablet  TAKE 1 TABLET TWICE A DAY 06/20/16  Yes [name], [name] L, DO  warfarin (COUMADIN) 5 MG tablet Take 1/2 to 1 tablet by mouth daily as directed by coumadin clinic Patient taking differently: Take 5 mg by mouth daily.  07/18/17  Yes Troy Sine, MD    Physical Exam: Vitals:   08/29/17 0530 08/29/17 0600 08/29/17 0656 08/29/17 0758  BP: 133/83 139/78 124/84 112/85  Pulse: 79 66 62 73  Resp: (!) 23 20 20 20   Temp:   97.9 F (36.6 C) 97.8 F (36.6 C)  TempSrc:   Oral Oral  SpO2: 95% 96% 93% 93%  Weight:   100.1 kg (220 lb 9.6 oz)  Height:   5\' 5"  (1.651 m)      General:  Appears only slightly uncomfortable lying in bed in no acute distress Eyes:  PERRL, EOMI, normal lids, iris ENT:  grossly normal hearing, lips & tongue, mucous membranes of his mouth are pink but dry Neck: +JVD, no masses or thyromegaly Cardiovascular:  Irregularly irregular heart sounds are distant + murmur 2+ pitting, bilateral LE edema.  Respiratory:  Mild to moderate increased work of breathing at rest. Use of abdominal accessory muscles is able to complete short sentences. Abdomen:  soft, ntnd, NABS Skin:  no rash or induration seen on limited exam Musculoskeletal:  grossly normal tone BUE/BLE, good ROM, no bony abnormality Psychiatric:  grossly normal mood and affect, speech fluent and appropriate, AOx3 Neurologic:  CN 2-12 grossly intact, moves all extremities in coordinated fashion, sensation intact  Labs on Admission: I have personally reviewed following labs and imaging studies  CBC: Recent Labs  Lab 08/29/17 0306  WBC 4.6  HGB 10.9*  HCT 36.9*  MCV 102.8*  PLT 614   Basic Metabolic Panel: Recent Labs  Lab 08/29/17 0306 08/29/17 0431  NA 143  --   K 3.3*  --   CL 96*  --   CO2 37*  --   GLUCOSE 102*  --   BUN 33*  --   CREATININE 2.30*  --   CALCIUM 9.4  --   MG  --  2.0   GFR: Estimated Creatinine Clearance: 28.8 mL/min (A) (by C-G formula based on SCr of 2.3 mg/dL (H)). Liver  Function Tests: No results for input(s): AST, ALT, ALKPHOS, BILITOT, PROT, ALBUMIN in the last 168 hours. No results for input(s): LIPASE, AMYLASE in the last 168 hours. No results for input(s): AMMONIA in the last 168 hours. Coagulation Profile: Recent Labs  Lab 08/29/17 0428  INR 1.82   Cardiac Enzymes: Recent Labs  Lab 08/29/17 0428  TROPONINI 0.05*   BNP (last 3 results) No results for input(s): PROBNP in the last 8760 hours. HbA1C: No results for input(s): HGBA1C in the last 72 hours. CBG: Recent Labs  Lab 08/29/17 0749  GLUCAP 75   Lipid Profile: No results for input(s): CHOL, HDL, LDLCALC, TRIG, CHOLHDL, LDLDIRECT in the last 72 hours. Thyroid Function Tests: No results for input(s): TSH, T4TOTAL, FREET4, T3FREE, THYROIDAB in the last 72 hours. Anemia Panel: No results for input(s): VITAMINB12, FOLATE, FERRITIN, TIBC, IRON, RETICCTPCT in the last 72 hours. Urine analysis:    Component Value Date/Time   COLORURINE YELLOW 10/27/2016 0113   APPEARANCEUR CLEAR 10/27/2016 0113   LABSPEC 1.013 10/27/2016 0113   PHURINE 6.0 10/27/2016 0113   GLUCOSEU NEGATIVE 10/27/2016 0113   HGBUR NEGATIVE 10/27/2016 0113   BILIRUBINUR NEGATIVE 10/27/2016 0113   KETONESUR NEGATIVE 10/27/2016 0113   PROTEINUR 100 (A) 10/27/2016 0113   UROBILINOGEN 0.2 06/17/2012 0622   NITRITE NEGATIVE 10/27/2016 0113   LEUKOCYTESUR NEGATIVE 10/27/2016 0113    Creatinine Clearance: Estimated Creatinine Clearance: 28.8 mL/min (A) (by C-G formula based on SCr of 2.3 mg/dL (H)).  Sepsis Labs: @LABRCNTIP (procalcitonin:4,lacticidven:4) )No results found for this or any previous visit (from the past 240 hour(s)).   Radiological Exams on Admission: Dg Chest 2 View  Result Date: 08/29/2017 CLINICAL DATA:  Shortness of breath and cough EXAM: CHEST - 2 VIEW COMPARISON:  07/29/2017, 07/09/2017 FINDINGS: Small pleural effusions. Cardiomegaly with mild vascular congestion. Increased interstitial  opacities. Aortic atherosclerosis. No pneumothorax. IMPRESSION: 1. Tiny pleural effusions 2. Cardiomegaly with vascular congestion 3.  Mild diffuse interstitial opacity, may reflect minimal interstitial edema versus interstitial inflammation. Electronically Signed   By: Donavan Foil M.D.   On: 08/29/2017 03:18    EKG: Independently reviewed. Atrial fibrillation Left axis deviation Anterior infarct , age undetermined when compared with ECG of 04/09/2016, QT has lengthened Atrial fibrillation has replaced Sinus rhythm  Assessment/Plan Principal Problem:   Respiratory distress Active Problems:   Persistent atrial fibrillation (HCC)   Acute on chronic diastolic heart failure (HCC)   Benign essential hypertension   Diabetes mellitus with renal complications (HCC)   Hypokalemia   Diabetic ulcer of lower leg (HCC)   Morbid obesity (HCC)   Hypothyroidism   Blood clotting disorder (HCC)   CKD (chronic kidney disease), stage III ([place])   #1. Respiratory distress likely related to acute on chronic diastolic heart failure related to dietary indiscretions and/or possible progression of disease. Patient with tachypnea chest x-ray consistent with CHF, initial troponin mildly elevated likely due to demand ischemia, EKG with atrial fibrillation and QT lengthening, BNP pending at the time of admission. He is provided with 40 mg of IV Lasix in the emergency department. At the time of admission respiratory rate within the limits of normal and oxygen saturation level greater than 90% on room air -Admit to telemetry -Continue to monitor oxygen saturation level -Lasix 40 mg IV twice a day -duo nebs -Oxygen supplementation as indicated -Daily weights -Intake and output  #2. Acute on chronic diastolic heart failure. Chest x-ray consistent with CHF. Patient with JVD and 2+ bilateral pitting edema as well as respiratory distress. Echo done September 2018 reveals an EF of 65-70%, moderate LVH no regional wall  abnormalities. Home medications include Demadex. -intake and output -Daily weights -Hold Demadex -IV Lasix as noted above -Echocardiogram -Cardiology consult  #3. Hypokalemia. Mild. Magnesium within limits of normal -replete -recheck -continue daily supplement  #4. Persistent atrial fib. Rate controlled. chadvasc score 5. INR 1.8.home medications include Coumadin. Chart review indicates at one time he was on amiodarone but that was discontinued. -Coumadin per pharmacy -Defer any further management to cardiology  #5. Hypertension. Controlled in the emergency department -Monitor  #6. Diabetes. I medications include oral agents.serum glucose was 102 -Carb modified diet -Hold oral agents as appetite unreliable -Sliding scale for optimal control -Obtain a hemoglobin A1c  #7. Elevated troponin. Initial troponin 0.05. Likely related to demand ischemia. Patient denies chest pain. Chest x-ray consistent with CHF -cycle troponin -Serial EKGs -Defer further management to cardiology  #8. Chronic kidney disease stage III. Creatinine 2.4 on admission. This appears to be his baseline. -Hold nephrotoxins as able -Monitor renal function given need for IV Lasix -Monitor urine output  #9. Diabetic wound right lower extremity. Patient with history of lower extremity venous insufficiency.He is afebrile no leukocytosis. -Wound consult  #10. Hypothyroidism -Continue home meds  #11. Obesity -Nutritional consult   DVT prophylaxis: coumadin Code Status: dnr  Family Communication: wife at bedside  Disposition Plan: home  Consults called: cardmaster  Admission status: inpatient    Radene Gunning MD [facility]  If 7PM-7AM, please contact night-coverage [email or web address] Password [facility]  08/29/2017, 8:56 AM

## 2017-08-29 NOTE — ED Triage Notes (Signed)
Pt reports that for the past several weeks he has been feeling SOB, hx of COPD and CHF, rhonchi lower lobes

## 2017-08-29 NOTE — Care Management Note (Signed)
Case Management Note  Patient Details  Name: Omar Lambert MRN: [id number] Date of Birth: 1941/01/11  Subjective/Objective:  Respiratory Distress                 Action/Plan: Patient lives at home with spouse; PCP: Gayland Curry, DO; has private insurance with [facility] HMO with prescription drug coverage; pharmacy of choice is [facility]; patient reports no problem getting his medication; he continue to drive his vehicle and no DME at home at this time; he stated that he plans to buy new scales ( his scales at home is broken). He endorsed a heart healthy diet with low sodium. No needs identified at this time. CM will continue to follow for progression of care.  Expected Discharge Date:    possibly 09/03/2017              Expected Discharge Plan:  Home/Self Care  Discharge planning Services  CM Consult  Status of Service:  In process, will continue to follow  Sherrilyn Rist [phone] 08/29/2017, 11:02 AM

## 2017-08-29 NOTE — ED Provider Notes (Signed)
[facility] EMERGENCY DEPARTMENT Provider Note   CSN: [id number] Arrival date & time: 08/29/17  0246     History   Chief Complaint Chief Complaint  Patient presents with  . Shortness of Breath    HPI Omar Lambert is a 77 y.o. male.  The history is provided by the patient and the spouse.  He has history of diabetes, hypertension, hyperlipidemia, COPD, CHF, atrial fibrillation and comes in because of worsening shortness of breath over the last month.  There has been minimal cough which is nonproductive.  He denies fever or chills.  Dyspnea is worse when he lays flat, and he has had paroxysmal nocturnal dyspnea.  His wife reports that his energy level is markedly decreased.  He denies chest pain, but has had some heaviness in his chest.  There is been no nausea or vomiting or diaphoresis.  Other than take his routine medications, he has not done anything to treat this at home.  Past Medical History:  Diagnosis Date  . Acute kidney injury superimposed on chronic kidney disease ([place]) 11/2016  . Acute respiratory failure ([place]) 11/2016  . Allergy   . Asthma   . Atrial fibrillation ([place])   . Atrophic gastritis   . Benign essential hypertension   . CHF (congestive heart failure) ([place])   . COPD (chronic obstructive pulmonary disease) ([place])   . Diabetes ([place])   . Diabetes mellitus without complication ([place])   . GERD (gastroesophageal reflux disease)   . Gout attack 09/2012  . Sinusitis, maxillary, chronic   . Thyroid disease   . Type II or unspecified type diabetes mellitus without mention of complication, not stated as uncontrolled     Patient Active Problem List   Diagnosis Date Noted  . Chronic diastolic CHF (congestive heart failure), NYHA class 2 ([place]) 07/29/2017  . Chronic venous hypertension (idiopathic) with ulcer of right lower extremity (CODE) ([place]) 07/29/2017  . Blood clotting disorder ([place]) 12/03/2016  . Long term (current) use of anticoagulants  [Z79.01] 11/12/2016  . Bradycardia with 41-50 beats per minute   . Acute kidney injury superimposed on chronic kidney disease ([place])   . DOE (dyspnea on exertion) 04/10/2016  . Diabetes mellitus with renal complications ([place])   . Hypothyroidism 03/29/2016  . Essential hypertension, benign 02/28/2015  . 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 Morbid obesity ([place]) 02/28/2015  . Hyperlipidemia associated with type 2 diabetes mellitus ([place]) 02/28/2015  . Persistent atrial fibrillation ([place]) 12/18/2014  . Alteration in anticoagulation 12/18/2014  . Chronic kidney disease (CKD) stage G3b/A2, moderately decreased glomerular filtration rate (GFR) between 30-44 mL/min/1.73 square meter and albuminuria creatinine ratio between 30-299 mg/g (HCC) 08/19/2014  . Noncompliance with medication regimen 02/15/2014  . Environmental allergies 02/15/2014  . 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 Esophageal reflux 09/18/2013  . Venous insufficiency of both lower extremities 03/14/2013  . OSA on CPAP 12/22/2012  . COPD GOLD II 11/26/2012  . Asthma   . Benign essential hypertension     Past Surgical History:  Procedure Laterality Date  . COLONOSCOPY  2003  . EYE SURGERY Left 05/06/2017   Cataract removed  . EYE SURGERY Right 02/05/2013   Cataract removed  . HERNIA REPAIR  1980        Home Medications    Prior to Admission medications   Medication Sig Start Date End Date Taking? Authorizing Provider  acetaminophen (TYLENOL) 325 MG tablet Take 650 mg by mouth every 6 (six) hours as needed for mild pain.  [provider]  albuterol (PROVENTIL HFA;VENTOLIN HFA) 108 (90 Base) MCG/ACT inhaler Inhale 2 puffs into the lungs every 6 (six) hours as needed for wheezing or shortness of breath. 07/09/17   Lauree Chandler, NP  albuterol (PROVENTIL) (2.5 MG/3ML) 0.083% nebulizer solution INHALE CONTENTS OF 1 VIAL IN NEBULIZER EVERY 4 HOURS AS NEEDED FOR WHEEZING AND ASTHMA 07/18/17   [name], [name] L, DO  allopurinol  (ZYLOPRIM) 300 MG tablet TAKE 1 TABLET BY MOUTH EVERY DAY 06/19/17   [name], [name] L, DO  calcitRIOL (ROCALTROL) 0.25 MCG capsule  07/24/17   [provider]  KLOR-CON M20 20 MEQ tablet TAKE 1 TABLET BY MOUTH EVERY DAY 04/09/16   [name], [name] L, DO  levothyroxine (SYNTHROID, LEVOTHROID) 175 MCG tablet Take 175 mcg by mouth daily before breakfast.    [provider]  levothyroxine (SYNTHROID, LEVOTHROID) 175 MCG tablet TAKE 1 TABLET EVERY DAY BEFORE BREAKFAST 08/21/17   [name], [name] L, DO  linagliptin (TRADJENTA) 5 MG TABS tablet Take 1 tablet (5 mg total) by mouth daily. 09/30/15   [name], [name] L, DO  Multiple Vitamins-Minerals (CENTRUM SILVER ADULT 50+ PO) Take 1 tablet by mouth daily.    [provider]  pantoprazole (PROTONIX) 40 MG tablet TAKE 1 TABLET BY MOUTH EVERY DAY BEFORE SUPPER 07/29/17   [name], [name] L, DO  rosuvastatin (CRESTOR) 20 MG tablet Take 1 tablet (20 mg total) by mouth daily. Need OV for further refills 08/13/17   Troy Sine, MD  torsemide (DEMADEX) 20 MG tablet TAKE 1 TABLET TWICE A DAY 06/20/16   [name], [name] L, DO  warfarin (COUMADIN) 5 MG tablet Take 1/2 to 1 tablet by mouth daily as directed by coumadin clinic 07/18/17   Troy Sine, MD    Family History Family History  Problem Relation Age of Onset  . Stroke Mother   . Cancer Sister   . Diabetes Sister   . COPD Sister   . COPD Sister     Social History Social History   Tobacco Use  . Smoking status: Former Smoker    Packs/day: 1.00    Years: 20.00    Pack years: 20.00    Types: Cigarettes  . Smokeless tobacco: Never Used  . Tobacco comment: QUIT SMOKING IN 2000  Substance Use Topics  . Alcohol use: No  . Drug use: No     Allergies   Patient has no known allergies.   Review of Systems Review of Systems  All other systems reviewed and are negative.    Physical Exam Updated Vital Signs BP 125/75 (BP Location: Right Arm)   Pulse 67   Temp 98.4 F (36.9 C)  (Oral)   Resp 16   SpO2 94%   Physical Exam  Nursing note and vitals reviewed.  77 year old male, resting comfortably and in no acute distress. Vital signs are normal. Oxygen saturation is 94%, which is normal. Head is normocephalic and atraumatic. PERRLA, EOMI. Oropharynx is clear. Neck is nontender and supple without adenopathy. JVD is present at 90 degrees. Back is nontender and there is no CVA tenderness.  1-2+ pre-sacral edema present. Lungs have bibasilar rales about one third of the way up.  There are a few scattered expiratory wheezes without rhonchi. Chest is nontender. Heart has regular rate and rhythm with 2/6 systolic ejection murmur along left sternal border. Abdomen is soft, flat, nontender without masses or hepatosplenomegaly and peristalsis is normoactive. Extremities have 2+ edema, full range of motion is  present. Skin is warm and dry without rash. Neurologic: Mental status is normal, cranial nerves are intact, there are no motor or sensory deficits.  ED Treatments / Results  Labs (all labs ordered are listed, but only abnormal results are displayed) Labs Reviewed  CBC - Abnormal; Notable for the following components:      Result Value   RBC 3.59 (*)    Hemoglobin 10.9 (*)    HCT 36.9 (*)    MCV 102.8 (*)    MCHC 29.5 (*)    RDW 18.3 (*)    All other components within normal limits  BASIC METABOLIC PANEL - Abnormal; Notable for the following components:   Potassium 3.3 (*)    Chloride 96 (*)    CO2 37 (*)    Glucose, Bld 102 (*)    BUN 33 (*)    Creatinine, Ser 2.30 (*)    GFR calc non Af Amer 26 (*)    GFR calc Af Amer 30 (*)    All other components within normal limits  PROTIME-INR - Abnormal; Notable for the following components:   Prothrombin Time 20.9 (*)    All other components within normal limits  TROPONIN I - Abnormal; Notable for the following components:   Troponin I 0.05 (*)    All other components within normal limits  MAGNESIUM  BRAIN  NATRIURETIC PEPTIDE    EKG EKG Interpretation  Date/Time:  Thursday August 29 2017 03:01:40 EDT Ventricular Rate:  78 PR Interval:    QRS Duration: 98 QT Interval:  560 QTC Calculation: 638 R Axis:   -85 Text Interpretation:  ** Critical Test Result: Long QTc Atrial fibrillation Left axis deviation Anterior infarct , age undetermined Abnormal ECG When compared with ECG of 04/09/2016, QT has lengthened Atrial fibrillation has replaced Sinus rhythm Confirmed by Delora Fuel ([id number]) on 08/29/2017 4:30:28 AM   Radiology Dg Chest 2 View  Result Date: 08/29/2017 CLINICAL DATA:  Shortness of breath and cough EXAM: CHEST - 2 VIEW COMPARISON:  07/29/2017, 07/09/2017 FINDINGS: Small pleural effusions. Cardiomegaly with mild vascular congestion. Increased interstitial opacities. Aortic atherosclerosis. No pneumothorax. IMPRESSION: 1. Tiny pleural effusions 2. Cardiomegaly with vascular congestion 3. Mild diffuse interstitial opacity, may reflect minimal interstitial edema versus interstitial inflammation. Electronically Signed   By: Donavan Foil M.D.   On: 08/29/2017 03:18    Procedures Procedures  Medications Ordered in ED Medications  furosemide (LASIX) injection 40 mg (40 mg Intravenous Given 08/29/17 0426)  potassium chloride SA (K-DUR,KLOR-CON) CR tablet 40 mEq (40 mEq Oral Given 08/29/17 0426)     Initial Impression / Assessment and Plan / ED Course  I have reviewed the triage vital signs and the nursing notes.  Pertinent labs & imaging results that were available during my care of the patient were reviewed by me and considered in my medical decision making (see chart for details).  Dyspnea which appears to be CHF exacerbation as manifested by peripheral edema, neck vein distention, basilar rales.  Chest x-ray is also consistent with CHF with cardiomegaly and mild vascular congestion.  Old records are reviewed, and he does have prior hospital admissions for CHF exacerbation.  Labs do show  mild hypokalemia and is given oral potassium.  He is also given intravenous furosemide.  Anticipate need for short hospitalization to effect adequate diuresis.  He had reasonable urine output from dose of furosemide.  Renal function is stable with creatinine 2.3.  Macrocytic anemia is present but unchanged from baseline.  INR slightly  subtherapeutic at 1.82.  Troponin is mildly elevated at 0.05 and this is felt to represent demand ischemia, but will need to be followed.  ECG does show prolonged QT interval, so magnesium level was checked and is normal.  Case is discussed with Dr. Hal Hope of [facility], who agrees to admit the patient.  Final Clinical Impressions(s) / ED Diagnoses   Final diagnoses:  Acute on chronic diastolic congestive heart failure (HCC)  Elevated troponin I level  Subtherapeutic international normalized ratio (INR)  Renal insufficiency  Macrocytic anemia  Prolonged Q-T interval on ECG    ED Discharge Orders    None       Delora Fuel, MD 41/58/30 [phone]

## 2017-08-29 NOTE — Consult Note (Signed)
Cardiology Consult    Patient ID: Omar Lambert MRN: [id number], DOB/AGE: 1940/02/26   Admit date: 08/29/2017 Date of Consult: 08/29/2017  Primary Physician: Gayland Curry, DO Primary Cardiologist: Dr. Claiborne Billings Requesting Provider: Dr. Evangeline Gula Reason for Consultation: CHF  Omar Lambert is a 77 y.o. male who is being seen today for the evaluation of CHF at the request of Dr. Evangeline Gula.  Patient Profile    78 yo male with PMH of severe COPD, DM, CKD, HTN, OSA and atrial flutter who presented with worsening dyspnea and acute on chronic HF.   Past Medical History   Past Medical History:  Diagnosis Date  . Acute kidney injury superimposed on chronic kidney disease ([place]) 11/2016  . Acute respiratory failure ([place]) 11/2016  . Allergy   . Asthma   . Atrial fibrillation ([place])   . Atrophic gastritis   . Benign essential hypertension   . CHF (congestive heart failure) ([place])   . CKD (chronic kidney disease), stage III ([place])   . COPD (chronic obstructive pulmonary disease) ([place])   . Diabetes ([place])   . Diabetes mellitus without complication ([place])   . Dyspnea   . GERD (gastroesophageal reflux disease)   . Gout attack 09/2012  . Sinusitis, maxillary, chronic   . Thyroid disease   . Type II or unspecified type diabetes mellitus without mention of complication, not stated as uncontrolled     Past Surgical History:  Procedure Laterality Date  . COLONOSCOPY  2003  . EYE SURGERY Left 05/06/2017   Cataract removed  . EYE SURGERY Right 02/05/2013   Cataract removed  . HERNIA REPAIR  1980    Allergies  No Known Allergies  History of Present Illness    Omar Lambert is a 77 year old male with past medical history of severe COPD, DM, CKD, HTN, OSA and atrial flutter.  He is followed by Dr. Claiborne Billings as an outpatient.  He is followed for obstructive sleep apnea as well.  It appears that he is intermittently compliant with his CPAP at home.  He was diagnosed with atrial flutter back in April  2016 and was initially placed on Xarelto for anticoagulation.  Thereafter he had noticed some flecks of blood in his stool but denied any overt GI bleeding, and his Xarelto was changed to Eliquis 5 mg twice daily.  He underwent a Lexiscan Myoview which was low risk with an EF of 47% back in September 2018.  Shortly thereafter underwent a procedure and after going home became lethargic and unresponsive according to his wife.  On EMS arrival he was found to be in atrial fibrillation and volume overloaded.  He was hospitalized and required intubation with IV diuretics.  Echo at that time showed an EF of 65 to 70% with moderate LVH without any regional wall motion abnormalities.  He had previously been on amiodarone with attempts to chemically cardiovert him.  When he was seen in the office on 12/03/2016 he was noted to be back in a sinus rhythm and his amiodarone was discontinued.  He was last seen in the office by Dr. Claiborne Billings on 02/18/2017 and reported feeling well.  Stated he had been more compliant with this CPAP machine and his sleep apnea was better controlled.  He has not had any recurrent chest pain.  Of note he is followed by nephrology for his chronic kidney disease.  Given his declining renal function he was ultimately switched from Eliquis to Coumadin.  Reports over the past several days he  has been increasingly short of breath with worsening lower extremity edema.  Also notes orthopnea and feels as if he is been gaining weight even though he does not weigh himself daily.  He has been compliant with his diuretic and has not taken extra doses at home.  Denies any chest pain but did report intermittent tightness in his chest with increased respirations.  States he has been having to use extra pillows to be able to sleep at night given his shortness of breath.  In the ED his labs showed sodium 143, potassium 3.3, creatinine 2.3, BNP 438, troponin 0.05>> 0.04, hemoglobin 10.9, INR 1.82.  Chest x-ray showed tiny  pleural effusions with mild diffuse interstitial edema.  EKG showed atrial fibrillation rate controlled.  His O2 sats were noted to be low, he was given a dose of IV Lasix in the ED and admitted to internal medicine for further work-up.  Inpatient Medications    . allopurinol  300 mg Oral Daily  . calcitRIOL  0.25 mcg Oral Daily  . furosemide  40 mg Intravenous BID  . ipratropium-albuterol  3 mL Nebulization BID  . levothyroxine  175 mcg Oral QAC breakfast  . potassium chloride SA  20 mEq Oral Daily  . rosuvastatin  20 mg Oral Daily  . sodium chloride flush  3 mL Intravenous Q12H  . warfarin  5 mg Oral ONCE-1800  . Warfarin - Pharmacist Dosing Inpatient   Does not apply q1800   Family History    Family History  Problem Relation Age of Onset  . Stroke Mother   . Cancer Sister   . Diabetes Sister   . COPD Sister   . COPD Sister     Social History    Social History   Socioeconomic History  . Marital status: Married    Spouse name: Not on file  . Number of children: 3  . Years of education: Not on file  . Highest education level: Not on file  Occupational History  . Occupation: Retired  Scientific laboratory technician  . Financial resource strain: Not hard at all  . Food insecurity:    Worry: Never true    Inability: Never true  . Transportation needs:    Medical: No    Non-medical: No  Tobacco Use  . Smoking status: Former Smoker    Packs/day: 1.00    Years: 20.00    Pack years: 20.00    Types: Cigarettes  . Smokeless tobacco: Never Used  . Tobacco comment: QUIT SMOKING IN 2000  Substance and Sexual Activity  . Alcohol use: No  . Drug use: No  . Sexual activity: Not Currently  Lifestyle  . Physical activity:    Days per week: 0 days    Minutes per session: 0 min  . Stress: Not at all  Relationships  . Social connections:    Talks on phone: Once a week    Gets together: Twice a week    Attends religious service: More than 4 times per year    Active member of club or  organization: No    Attends meetings of clubs or organizations: Never    Relationship status: Married  . Intimate partner violence:    Fear of current or ex partner: No    Emotionally abused: No    Physically abused: No    Forced sexual activity: No  Other Topics Concern  . Not on file  Social History Narrative   ** Merged History Encounter **  Lives in 1 story apt with his wife, Augusta Hilbert.  Sometimes exercises.  Drinks coffee.       Review of Systems    See HPI  All other systems reviewed and are otherwise negative except as noted above.  Physical Exam    Blood pressure 112/85, pulse 69, temperature 97.8 F (36.6 C), temperature source Oral, resp. rate 20, height 5\' 5"  (1.651 m), weight 220 lb 9.6 oz (100.1 kg), SpO2 91 %.  General: Pleasant, older AAM, NAD Psych: Normal affect. Neuro: Alert and oriented X 3. Moves all extremities spontaneously. HEENT: Normal  Neck: Supple, no JVD. Lungs:  Resp regular and unlabored, CTA. Heart: Irreg Irreg soft systolic murmur. Abdomen: Soft, non-tender, non-distended, BS + x 4.  Extremities: No clubbing, cyanosis, 1+ LE edema. DP/PT/Radials 2+ and equal bilaterally.  Labs    Troponin (Point of Care Test) No results for input(s): TROPIPOC in the last 72 hours. Recent Labs    08/29/17 0428 08/29/17 0904  TROPONINI 0.05* 0.04*   Lab Results  Component Value Date   WBC 4.6 08/29/2017   HGB 10.9 (L) 08/29/2017   HCT 36.9 (L) 08/29/2017   MCV 102.8 (H) 08/29/2017   PLT 151 08/29/2017    Recent Labs  Lab 08/29/17 0306  NA 143  K 3.3*  CL 96*  CO2 37*  BUN 33*  CREATININE 2.30*  CALCIUM 9.4  GLUCOSE 102*   Lab Results  Component Value Date   CHOL 214 (H) 02/18/2017   HDL 81 02/18/2017   LDLCALC 124 (H) 02/18/2017   TRIG 46 02/18/2017   No results found for: [facility]   Radiology Studies    Dg Chest 2 View  Result Date: 08/29/2017 CLINICAL DATA:  Shortness of breath and cough EXAM: CHEST - 2 VIEW  COMPARISON:  07/29/2017, 07/09/2017 FINDINGS: Small pleural effusions. Cardiomegaly with mild vascular congestion. Increased interstitial opacities. Aortic atherosclerosis. No pneumothorax. IMPRESSION: 1. Tiny pleural effusions 2. Cardiomegaly with vascular congestion 3. Mild diffuse interstitial opacity, may reflect minimal interstitial edema versus interstitial inflammation. Electronically Signed   By: Donavan Foil M.D.   On: 08/29/2017 03:18    ECG & Cardiac Imaging    EKG:  The EKG was personally reviewed and demonstrates Afib, rate controlled.   Echo: 10/27/16  Study Conclusions  - Left ventricle: The cavity size was normal. Wall thickness was   increased in a pattern of moderate LVH. Systolic function was   vigorous. The estimated ejection fraction was in the range of 65%   to 70%. Wall motion was normal; there were no regional wall   motion abnormalities. - Ventricular septum: The contour showed diastolic flattening. - Right ventricle: The cavity size was moderately dilated. - Right atrium: The atrium was moderately dilated. - Tricuspid valve: There was severe regurgitation. - Pulmonary arteries: Systolic pressure was mildly to moderately   increased. PA peak pressure: 38 mm Hg (S).  Assessment & Plan    77 yo male with PMH of severe COPD, DM, CKD, HTN, OSA and atrial flutter/afib who presented with worsening dyspnea and acute on chronic HF.   1. Acute on Chronic diastolic HF: In talking with him he reports compliance with his diet and fluid intake at home prior to admission. Has not been taking extra lasix at home recently. Does not weigh himself as his scales are broken but noticed increased fluid in his LE. BNP elevated on admission and CXR with mild edema. Primary attempting to diuresis with IV lasix, and he  states he is actually feeling better this afternoon.  -- would continue with IV lasix with close following of his renal function. May need nephrology input pending his  response.   2. PAF: He has been on amiodarone in the past, but this was stopped back in 10/18 when he was noted to be in [place]. On admission is back in AFib rate controlled. Given his INR was subtherapeutic would not attempt DCCV or chemical conversion at this time. Question whether this is a playing a role in his volume overload? -- coumadin per PharmD  3. CKD: Appears baseline is around 2 most recently. Followed by nephrology as an outpatient.  -- daily BMET with diuresis.   4. OSA on Cpap: reports better compliance recently  5. DM: CBG was low on admission, and again this afternoon. Home medications on hold at this time.   6. HL: on statin  Barnet Pall, NP-C Pager [phone] 08/29/2017, 10:57 AM

## 2017-08-29 NOTE — Progress Notes (Signed)
Patient alert oriented. Fine tremors  blood sugar dropped to 26-50 today backup to 89 after Dextrose 50, Dextrose 10% running at 47ml/hr. Plant to check blood sugar every 4 hours.

## 2017-08-29 NOTE — ED Notes (Signed)
Dr. Roxanne Mins notified on pt.'s elevated Troponin result .

## 2017-08-29 NOTE — Plan of Care (Signed)
  Problem: Activity: Goal: Risk for activity intolerance will decrease Outcome: Progressing   Problem: Safety: Goal: Ability to remain free from injury will improve Outcome: Progressing   Problem: Activity: Goal: Capacity to carry out activities will improve Outcome: Progressing

## 2017-08-29 NOTE — ED Notes (Signed)
Attempted to call report

## 2017-08-29 NOTE — Care Management (Signed)
Patient with 2 episodes of hypoglycemia today.  Patient's wife reports that he routinely gets headache and has a tremor every day.  I suspect that the patient may be hypoglycemic on a daily basis.  [name] is currently on hold.  Would recommend discontinuing it and trying something else or a much lower dose prior to discharge.

## 2017-08-30 ENCOUNTER — Other Ambulatory Visit (HOSPITAL_COMMUNITY): Payer: Medicare HMO

## 2017-08-30 DIAGNOSIS — N183 Chronic kidney disease, stage 3 (moderate): Secondary | ICD-10-CM

## 2017-08-30 DIAGNOSIS — R0603 Acute respiratory distress: Secondary | ICD-10-CM

## 2017-08-30 LAB — GLUCOSE, CAPILLARY
GLUCOSE-CAPILLARY: 105 mg/dL — AB (ref 70–99)
GLUCOSE-CAPILLARY: 77 mg/dL (ref 70–99)
GLUCOSE-CAPILLARY: 82 mg/dL (ref 70–99)
GLUCOSE-CAPILLARY: 79 mg/dL (ref 70–99)
Glucose-Capillary: 168 mg/dL — ABNORMAL HIGH (ref 70–99)
Glucose-Capillary: 99 mg/dL (ref 70–99)

## 2017-08-30 LAB — BASIC METABOLIC PANEL
Anion gap: 12 (ref 5–15)
BUN: 38 mg/dL — AB (ref 8–23)
CO2: 36 mmol/L — ABNORMAL HIGH (ref 22–32)
CREATININE: 2.25 mg/dL — AB (ref 0.61–1.24)
Calcium: 9.7 mg/dL (ref 8.9–10.3)
Chloride: 95 mmol/L — ABNORMAL LOW (ref 98–111)
GFR calc Af Amer: 30 mL/min — ABNORMAL LOW (ref >60)
GFR, EST NON AFRICAN AMERICAN: 26 mL/min — AB (ref >60)
GLUCOSE: 116 mg/dL — AB (ref 70–99)
Potassium: 3.8 mmol/L (ref 3.5–5.1)
Sodium: 143 mmol/L (ref 135–145)

## 2017-08-30 LAB — CBC
HEMATOCRIT: 37.5 % — AB (ref 39.0–52.0)
Hemoglobin: 11 g/dL — ABNORMAL LOW (ref 13.0–17.0)
MCH: 30.2 pg (ref 26.0–34.0)
MCHC: 29.3 g/dL — AB (ref 30.0–36.0)
MCV: 103 fL — ABNORMAL HIGH (ref 78.0–100.0)
Platelets: 144 10*3/uL — ABNORMAL LOW (ref 150–400)
RBC: 3.64 MIL/uL — ABNORMAL LOW (ref 4.22–5.81)
RDW: 18.4 % — AB (ref 11.5–15.5)
WBC: 4.2 10*3/uL (ref 4.0–10.5)

## 2017-08-30 LAB — LIPID PANEL
CHOLESTEROL: 142 mg/dL (ref 0–200)
HDL: 87 mg/dL (ref >40)
LDL Cholesterol: 51 mg/dL (ref 0–99)
TRIGLYCERIDES: 21 mg/dL (ref <150)
Total CHOL/HDL Ratio: 1.6 RATIO
VLDL: 4 mg/dL (ref 0–40)

## 2017-08-30 LAB — PROTIME-INR
INR: 1.81
Prothrombin Time: 20.8 seconds — ABNORMAL HIGH (ref 11.4–15.2)

## 2017-08-30 MED ORDER — WARFARIN SODIUM 5 MG PO TABS
5.0000 mg | ORAL_TABLET | Freq: Once | ORAL | Status: AC
  Administered 2017-08-30: 5 mg via ORAL
  Filled 2017-08-30: qty 1

## 2017-08-30 MED ORDER — ISOSORBIDE MONONITRATE ER 30 MG PO TB24
30.0000 mg | ORAL_TABLET | Freq: Every day | ORAL | Status: DC
  Administered 2017-08-30 – 2017-09-01 (×3): 30 mg via ORAL
  Filled 2017-08-30 (×3): qty 1

## 2017-08-30 NOTE — Progress Notes (Signed)
Heart Failure Navigator Consult Note  Presentation: TAKASHI KOROL is a 77 yo male with PMH of severe COPD, DM, CKD, HTN, OSA and atrial flutter who presented with worsening dyspnea and acute on chronic HF.   Past Medical History:  Diagnosis Date  . Acute kidney injury superimposed on chronic kidney disease ([place]) 11/2016  . Acute respiratory failure ([place]) 11/2016  . Allergy   . Asthma   . Atrial fibrillation ([place])   . Atrophic gastritis   . Benign essential hypertension   . CHF (congestive heart failure) ([place])   . CKD (chronic kidney disease), stage III ([place])   . COPD (chronic obstructive pulmonary disease) ([place])   . Diabetes ([place])   . Diabetes mellitus without complication ([place])   . Dyspnea   . GERD (gastroesophageal reflux disease)   . Gout attack 09/2012  . Sinusitis, maxillary, chronic   . Thyroid disease   . Type II or unspecified type diabetes mellitus without mention of complication, not stated as uncontrolled     Social History   Socioeconomic History  . Marital status: Married    Spouse name: Not on file  . Number of children: 3  . Years of education: Not on file  . Highest education level: Not on file  Occupational History  . Occupation: Retired  Scientific laboratory technician  . Financial resource strain: Not hard at all  . Food insecurity:    Worry: Never true    Inability: Never true  . Transportation needs:    Medical: No    Non-medical: No  Tobacco Use  . Smoking status: Former Smoker    Packs/day: 1.00    Years: 20.00    Pack years: 20.00    Types: Cigarettes  . Smokeless tobacco: Never Used  . Tobacco comment: QUIT SMOKING IN 2000  Substance and Sexual Activity  . Alcohol use: No  . Drug use: No  . Sexual activity: Not Currently  Lifestyle  . Physical activity:    Days per week: 0 days    Minutes per session: 0 min  . Stress: Not at all  Relationships  . Social connections:    Talks on phone: Once a week    Gets together: Twice a week    Attends  religious service: More than 4 times per year    Active member of club or organization: No    Attends meetings of clubs or organizations: Never    Relationship status: Married  Other Topics Concern  . Not on file  Social History Narrative   ** Merged History Encounter **       Lives in 1 story apt with his wife, Kayshaun Polanco.  Sometimes exercises.  Drinks coffee.      ECHO:Study Conclusions--08/29/17  - Left ventricle: The cavity size was normal. There was moderate   concentric hypertrophy. Systolic function was normal. The   estimated ejection fraction was in the range of 50% to 55%. Wall   motion was normal; there were no regional wall motion   abnormalities. Left ventricular diastolic function parameters   were normal. - Ventricular septum: The contour showed diastolic flattening   consistent with right ventricular volume overload. - Aortic valve: Transvalvular velocity was within the normal range.   There was no stenosis. There was trivial regurgitation. - Right ventricle: The cavity size was mildly dilated. Wall   thickness was normal. Systolic function was normal. - Right atrium: The atrium was severely dilated. - Tricuspid valve: There was severe regurgitation. -  Pulmonary arteries: Systolic pressure was mildly increased. PA   peak pressure: 47 mm Hg (S).  BNP    Component Value Date/Time   BNP 438.9 (H) 08/29/2017 0306   BNP 544 (H) 07/09/2017 1345    ProBNP No results found for: PROBNP   Education Assessment and Provision:  Detailed education and instructions provided on heart failure disease management including the following:  Signs and symptoms of Heart Failure When to call the physician Importance of daily weights Low sodium diet Fluid restriction Medication management Anticipated future follow-up appointments  Patient education given on each of the above topics.  Patient acknowledges understanding and acceptance of all instructions.  I spoke with  Mr. Wachsmuth regarding his HF and current hospitalization.  He tells me that his scale is broken.  I have provided a new scale for his home use.   I reviewed the importance of daily weights and when to contact the physician.  I also reviewed a low sodium diet and high sodium foods to avoid.  He tells me his wife loves pizza and they eat that often.  I discouraged that and other high sodium foods.  He denies any issues getting or taking prescribed medications. He will follow with Dr. Claiborne [facility] after discharge.  Education Materials:  "Living Better With Heart Failure" Booklet, Daily Weight Tracker Tool .   High Risk Criteria for Readmission and/or Poor Patient Outcomes:   EF <30%-No 50-55%  2 or more admissions in 6 months- No 1/87mo  Difficult social situation- No denies  Demonstrates medication noncompliance- No    Barriers of Care:  Knowledge and compliance  Discharge Planning:   Plans to return home with wife in [place]

## 2017-08-30 NOTE — Plan of Care (Signed)
Nutrition Education Note  RD consulted for nutrition education regarding heart failure and DM.  Lab Results  Component Value Date   HGBA1C 6.0 (H) 07/29/2017   CBGS: 77-168. Pt on no DM medications currently. PTA DM medications 5 mg tradjenta daily.   Spoke with pt and wife at bedside, both who are in good spirits. Pt always has a great appetite, consumes 2 meals and 2 snacks daily. Breakfast consists of grits and eggs OR cereal (raisin bran, corn flakes, or rice krispies), Dinner: baked chicken or fish on a salad (occasionally will eat grits or french). Pt will snack on left over from the night before at lunch and before bedtime. Pt wife only uses Mrs. Dash to season foods. Pt wife does the cooking and is very knowledgeable, mainly requesting an educational handout to reinforce that what she prepares at home is consistent with recommendations.    RD provided "Heart Healthy, Consistent Carbohydrate Nutrition Therapy" handout from the Academy of [employer] and Dietetics. Reviewed patient's dietary recall. Provided examples on ways to decrease sodium intake in diet. Discouraged intake of processed foods and use of salt shaker. Encouraged fresh fruits and vegetables as well as whole grain sources of carbohydrates to maximize fiber intake.   RD discussed why it is important for patient to adhere to diet recommendations, and emphasized the role of fluids, foods to avoid, and importance of weighing self daily.   Discussed different food groups and their effects on blood sugar, emphasizing carbohydrate-containing foods. Provided list of carbohydrates and recommended serving sizes of common foods.  Discussed importance of controlled and consistent carbohydrate intake throughout the day. Provided examples of ways to balance meals/snacks and encouraged intake of high-fiber, whole grain complex carbohydrates. Teach back method used.  Expect good compliance.  Body mass index is 36.78 kg/m. Pt meets  criteria for obesity, class II based on current BMI.  Current diet order is heart healthy/ carb modified, patient is consuming approximately 100% of meals at this time. Labs and medications reviewed. No further nutrition interventions warranted at this time. RD contact information provided. If additional nutrition issues arise, please re-consult RD.   Nalia Honeycutt A. Jimmye Norman, RD, LDN, CDE Pager: [phone] After hours Pager: [phone]

## 2017-08-30 NOTE — Discharge Instructions (Signed)

## 2017-08-30 NOTE — Progress Notes (Signed)
PROGRESS NOTE    Omar Lambert  ZOX:[id number] DOB: 03/14/1940 DOA: 08/29/2017 PCP: Gayland Curry, DO  Outpatient Specialists:     Brief Narrative:  Patient is a 77 year old African-American male, morbidly obese, with past medical history significant for COPD, asthma, atrial fibrillation, hypertension, chronic kidney disease stage III/IV, pulmonary hypertension, right ventricular failure.  Patient is also documented to have diastolic heart failure.  Patient has OSA, been noncompliant with the CPAP machine at home.  Patient was admitted with heart failure exacerbation.    Assessment & Plan:   Principal Problem:   Respiratory distress Active Problems:   Benign essential hypertension   Persistent atrial fibrillation (HCC)   Morbid obesity (HCC)   Hypothyroidism   Diabetes mellitus with renal complications (HCC)   Blood clotting disorder (HCC)   Acute on chronic diastolic heart failure (HCC)   Hypokalemia   Diabetic ulcer of lower leg (HCC)   CKD (chronic kidney disease), stage III (HCC)   Acute on chronic diastolic congestive heart failure (HCC)   Elevated troponin I level   Acute on chronic diastolic heart failure:  -intake and output -Daily weights -IV Lasix as noted above -Echocardiogram -Cardiology input is appreciated  OSA not compliant with CPAP/Right heart failure: Patient counseled regarding need to comply with CPAP at home. -Follow-up with pulmonary team on discharge. -Will defer further work-up of right heart failure to cardiology and pulmonary team.  Hypokalemia: Monitor electrolytes and replete.  Persistent atrial fib: Rate controlled.  chadvasc score 5.  INR 1.8. Optimize INR (Goal is 2-3) -Coumadin per pharmacy  Hypertension: .Controlled  -Monitor  Diabetes: - Low blood sugar noted on presentation - Initially on Dextrose infusion -Blood sugar this morning was 105. - Hold Fluid - Monitor blood sugar closely -Obtain a hemoglobin  A1c  Elevated troponin:  Initial troponin 0.05. Repeat Troponin is down to 0.04. Likely type II elevation  Chronic kidney disease stage III/IV: -Stable.  Obesity: Diet and exercise.   DVT prophylaxis: coumadin Code Status: DNR Family Communication: wife at bedside  Disposition Plan: home  Consults called: Cardiology Admission status: inpatient    Antimicrobials:   None    Subjective: SOB is improving.  Objective: Vitals:   08/29/17 1943 08/30/17 0357 08/30/17 0730 08/30/17 1133  BP:  135/74  110/75  Pulse:  70  73  Resp:  18  20  Temp:  97.6 F (36.4 C)  97.8 F (36.6 C)  TempSrc:  Oral  Oral  SpO2: 93% 95% 95% (!) 70%  Weight:  100.2 kg (221 lb)    Height:        Intake/Output Summary (Last 24 hours) at 08/30/2017 1357 Last data filed at 08/30/2017 1100 Gross per 24 hour  Intake 2095.73 ml  Output 2025 ml  Net 70.73 ml   Filed Weights   08/29/17 0656 08/30/17 0357  Weight: 100.1 kg (220 lb 9.6 oz) 100.2 kg (221 lb)    Examination:  General exam: Obese. Appears calm and comfortable  Respiratory system: Clear to auscultation. Cardiovascular system: S1 & S2 hear Gastrointestinal system: Abdomen is obese, soft and nontender. Organs are difficult to assess.  Central nervous system: Alert and oriented. No focal neurological deficits. Extremities: 2-2+ leg edema.    Data Reviewed: I have personally reviewed following labs and imaging studies  CBC: Recent Labs  Lab 08/29/17 0306 08/30/17 0556  WBC 4.6 4.2  HGB 10.9* 11.0*  HCT 36.9* 37.5*  MCV 102.8* 103.0*  PLT 151 144*   Basic  Metabolic Panel: Recent Labs  Lab 08/29/17 0306 08/29/17 0431 08/30/17 0556  NA 143  --  143  K 3.3*  --  3.8  CL 96*  --  95*  CO2 37*  --  36*  GLUCOSE 102*  --  116*  BUN 33*  --  38*  CREATININE 2.30*  --  2.25*  CALCIUM 9.4  --  9.7  MG  --  2.0  --    GFR: Estimated Creatinine Clearance: 29.5 mL/min (A) (by C-G formula based on SCr of 2.25  mg/dL (H)). Liver Function Tests: No results for input(s): AST, ALT, ALKPHOS, BILITOT, PROT, ALBUMIN in the last 168 hours. No results for input(s): LIPASE, AMYLASE in the last 168 hours. No results for input(s): AMMONIA in the last 168 hours. Coagulation Profile: Recent Labs  Lab 08/29/17 0428 08/30/17 0719  INR 1.82 1.81   Cardiac Enzymes: Recent Labs  Lab 08/29/17 0428 08/29/17 0904  TROPONINI 0.05* 0.04*   BNP (last 3 results) No results for input(s): PROBNP in the last 8760 hours. HbA1C: No results for input(s): HGBA1C in the last 72 hours. CBG: Recent Labs  Lab 08/29/17 1949 08/30/17 0041 08/30/17 0400 08/30/17 0717 08/30/17 1134  GLUCAP 81 99 168* 105* 77   Lipid Profile: Recent Labs    08/30/17 0556  CHOL 142  HDL 87  LDLCALC 51  TRIG 21  CHOLHDL 1.6   Thyroid Function Tests: No results for input(s): TSH, T4TOTAL, FREET4, T3FREE, THYROIDAB in the last 72 hours. Anemia Panel: No results for input(s): VITAMINB12, FOLATE, FERRITIN, TIBC, IRON, RETICCTPCT in the last 72 hours. Urine analysis:    Component Value Date/Time   COLORURINE YELLOW 10/27/2016 0113   APPEARANCEUR CLEAR 10/27/2016 0113   LABSPEC 1.013 10/27/2016 0113   PHURINE 6.0 10/27/2016 0113   GLUCOSEU NEGATIVE 10/27/2016 0113   HGBUR NEGATIVE 10/27/2016 0113   BILIRUBINUR NEGATIVE 10/27/2016 0113   KETONESUR NEGATIVE 10/27/2016 0113   PROTEINUR 100 (A) 10/27/2016 0113   UROBILINOGEN 0.2 06/17/2012 0622   NITRITE NEGATIVE 10/27/2016 0113   LEUKOCYTESUR NEGATIVE 10/27/2016 0113   Sepsis Labs: @LABRCNTIP (procalcitonin:4,lacticidven:4)  )No results found for this or any previous visit (from the past 240 hour(s)).       Radiology Studies: Dg Chest 2 View  Result Date: 08/29/2017 CLINICAL DATA:  Shortness of breath and cough EXAM: CHEST - 2 VIEW COMPARISON:  07/29/2017, 07/09/2017 FINDINGS: Small pleural effusions. Cardiomegaly with mild vascular congestion. Increased interstitial  opacities. Aortic atherosclerosis. No pneumothorax. IMPRESSION: 1. Tiny pleural effusions 2. Cardiomegaly with vascular congestion 3. Mild diffuse interstitial opacity, may reflect minimal interstitial edema versus interstitial inflammation. Electronically Signed   By: Donavan Foil M.D.   On: 08/29/2017 03:18        Scheduled Meds: . allopurinol  300 mg Oral Daily  . calcitRIOL  0.25 mcg Oral Daily  . furosemide  40 mg Intravenous BID  . ipratropium-albuterol  3 mL Nebulization BID  . isosorbide mononitrate  30 mg Oral Daily  . levothyroxine  175 mcg Oral QAC breakfast  . potassium chloride SA  20 mEq Oral Daily  . rosuvastatin  20 mg Oral Daily  . sodium chloride flush  3 mL Intravenous Q12H  . warfarin  5 mg Oral ONCE-1800  . Warfarin - Pharmacist Dosing Inpatient   Does not apply q1800   Continuous Infusions: . sodium chloride       LOS: 1 day    Time spent: [address],  MD  [facility] Pager #: [phone] 7PM-7AM contact night coverage as above

## 2017-08-30 NOTE — Progress Notes (Signed)
ANTICOAGULATION CONSULT NOTE   Pharmacy Consult for warfarin dosing Indication: atrial fibrillation  No Known Allergies  Patient Measurements: Height: 5\' 5"  (165.1 cm) Weight: 221 lb (100.2 kg) IBW/kg (Calculated) : 61.5  Vital Signs: Temp: 97.6 F (36.4 C) (07/26 0357) Temp Source: Oral (07/26 0357) BP: 135/74 (07/26 0357) Pulse Rate: 70 (07/26 0357)  Labs: Recent Labs    08/29/17 0306 08/29/17 0428 08/29/17 0904 08/30/17 0556 08/30/17 0719  HGB 10.9*  --   --  11.0*  --   HCT 36.9*  --   --  37.5*  --   PLT 151  --   --  144*  --   LABPROT  --  20.9*  --   --  20.8*  INR  --  1.82  --   --  1.81  CREATININE 2.30*  --   --  2.25*  --   TROPONINI  --  0.05* 0.04*  --   --     Estimated Creatinine Clearance: 29.5 mL/min (A) (by C-G formula based on SCr of 2.25 mg/dL (H)).   Medical History: Past Medical History:  Diagnosis Date  . Acute kidney injury superimposed on chronic kidney disease ([place]) 11/2016  . Acute respiratory failure ([place]) 11/2016  . Allergy   . Asthma   . Atrial fibrillation ([place])   . Atrophic gastritis   . Benign essential hypertension   . CHF (congestive heart failure) ([place])   . CKD (chronic kidney disease), stage III ([place])   . COPD (chronic obstructive pulmonary disease) ([place])   . Diabetes ([place])   . Diabetes mellitus without complication ([place])   . Dyspnea   . GERD (gastroesophageal reflux disease)   . Gout attack 09/2012  . Sinusitis, maxillary, chronic   . Thyroid disease   . Type II or unspecified type diabetes mellitus without mention of complication, not stated as uncontrolled     Medications:  Medications Prior to Admission  Medication Sig Dispense Refill Last Dose  . acetaminophen (TYLENOL) 325 MG tablet Take 650 mg by mouth every 6 (six) hours as needed for mild pain.   Past Week at Unknown time  . albuterol (PROVENTIL) (2.5 MG/3ML) 0.083% nebulizer solution INHALE CONTENTS OF 1 VIAL IN NEBULIZER EVERY 4 HOURS AS NEEDED FOR  WHEEZING AND ASTHMA 75 mL 4 Past Week at Unknown time  . allopurinol (ZYLOPRIM) 300 MG tablet TAKE 1 TABLET BY MOUTH EVERY DAY 30 tablet 6 08/28/2017 at Unknown time  . calcitRIOL (ROCALTROL) 0.25 MCG capsule Take 0.25 mcg by mouth daily.    08/28/2017 at Unknown time  . KLOR-CON M20 20 MEQ tablet TAKE 1 TABLET BY MOUTH EVERY DAY 30 tablet 1 08/28/2017 at Unknown time  . levothyroxine (SYNTHROID, LEVOTHROID) 175 MCG tablet TAKE 1 TABLET EVERY DAY BEFORE BREAKFAST 30 tablet 3 08/28/2017 at Unknown time  . linagliptin (TRADJENTA) 5 MG TABS tablet Take 1 tablet (5 mg total) by mouth daily. 90 tablet 3 08/28/2017 at Unknown time  . Multiple Vitamins-Minerals (CENTRUM SILVER ADULT 50+ PO) Take 1 tablet by mouth daily.   08/28/2017 at Unknown time  . pantoprazole (PROTONIX) 40 MG tablet TAKE 1 TABLET BY MOUTH EVERY DAY BEFORE SUPPER 90 tablet 0 08/28/2017 at Unknown time  . rosuvastatin (CRESTOR) 20 MG tablet Take 1 tablet (20 mg total) by mouth daily. Need OV for further refills 30 tablet 2 08/28/2017 at Unknown time  . torsemide (DEMADEX) 20 MG tablet TAKE 1 TABLET TWICE A DAY 60 tablet 3 08/28/2017 at  Unknown time  . warfarin (COUMADIN) 5 MG tablet Take 1/2 to 1 tablet by mouth daily as directed by coumadin clinic (Patient taking differently: Take 2.5-5 mg by mouth daily. ) 30 tablet 2 08/28/2017 at 0900    Assessment: 77 yo male on warfarin for a fib presented with worsening shortness of breath, edema, and weight gain.   Patient reports taking 5mg  M,W,F and 2.5mg  STThSa. However, the most recent clinic visit has 5mg  WF and 2.5 SMTThSa.    Patient's INR subtherapeutic at 1.81. CBC stable.   Goal of Therapy:  INR 2-3 Monitor platelets by anticoagulation protocol: Yes   Plan:  Give warfarin 5mg  today Daily INR and CBC    [name], Pharm D PGY1 Pharmacy Resident  Please check amion for the clinical pharmacist number 08/30/2017      11:29 AM

## 2017-08-30 NOTE — Progress Notes (Signed)
Progress Note  Patient Name: Omar Lambert Date of Encounter: 08/30/2017  Primary Cardiologist:  [name], MD  Subjective   Breathing better today, wife does the cooking, scales are broken.  Says she would appreciated nutritional info  Currently on D10 at 50 cc/hr due to low CBG last pm>>RN contacting IM on this  Inpatient Medications    Scheduled Meds: . allopurinol  300 mg Oral Daily  . calcitRIOL  0.25 mcg Oral Daily  . furosemide  40 mg Intravenous BID  . ipratropium-albuterol  3 mL Nebulization BID  . levothyroxine  175 mcg Oral QAC breakfast  . potassium chloride SA  20 mEq Oral Daily  . rosuvastatin  20 mg Oral Daily  . sodium chloride flush  3 mL Intravenous Q12H  . Warfarin - Pharmacist Dosing Inpatient   Does not apply q1800   Continuous Infusions: . sodium chloride    . dextrose 50 mL/hr at 08/29/17 1905   PRN Meds: sodium chloride, acetaminophen, ondansetron (ZOFRAN) IV, sodium chloride flush   Vital Signs    Vitals:   08/29/17 1656 08/29/17 1943 08/30/17 0357 08/30/17 0730  BP: 128/70  135/74   Pulse: 66  70   Resp: 20  18   Temp: 97.9 F (36.6 C)  97.6 F (36.4 C)   TempSrc: Oral  Oral   SpO2: 94% 93% 95% 95%  Weight:   221 lb (100.2 kg)   Height:        Intake/Output Summary (Last 24 hours) at 08/30/2017 0831 Last data filed at 08/30/2017 0820 Gross per 24 hour  Intake 2919.87 ml  Output 2450 ml  Net 469.87 ml   Filed Weights   08/29/17 0656 08/30/17 0357  Weight: 220 lb 9.6 oz (100.1 kg) 221 lb (100.2 kg)    Telemetry    Atrial fib, rate controlled, no sig pauses - Personally Reviewed  ECG    07/26, Atrial fib, hr 78, no acute ischemic changes - Personally Reviewed  Physical Exam   General: Well developed, well nourished, male appearing in no acute distress. Head: Normocephalic, atraumatic.  Neck: Supple without bruits, JVD 10 cm. Lungs:  Resp regular and unlabored, some rales, + exp wheeze. Heart: Irreg R&R, S1, S2,  no S3, S4, 2-3/6 murmur; no rub. Abdomen: Soft, non-tender, non-distended with normoactive bowel sounds. No hepatomegaly. No rebound/guarding. No obvious abdominal masses. Extremities: No clubbing, cyanosis, no edema. Distal pedal pulses are 2+ bilaterally. Neuro: Alert and oriented X 3. Moves all extremities spontaneously. Psych: Normal affect.  Labs    Hematology Recent Labs  Lab 08/29/17 0306 08/30/17 0556  WBC 4.6 4.2  RBC 3.59* 3.64*  HGB 10.9* 11.0*  HCT 36.9* 37.5*  MCV 102.8* 103.0*  MCH 30.4 30.2  MCHC 29.5* 29.3*  RDW 18.3* 18.4*  PLT 151 144*    Chemistry Recent Labs  Lab 08/29/17 0306 08/30/17 0556  NA 143 143  K 3.3* 3.8  CL 96* 95*  CO2 37* 36*  GLUCOSE 102* 116*  BUN 33* 38*  CREATININE 2.30* 2.25*  CALCIUM 9.4 9.7  GFRNONAA 26* 26*  GFRAA 30* 30*  ANIONGAP 10 12     Cardiac Enzymes Recent Labs  Lab 08/29/17 0428 08/29/17 0904  TROPONINI 0.05* 0.04*     BNP Recent Labs  Lab 08/29/17 0306  BNP 438.9*     Radiology    Dg Chest 2 View  Result Date: 08/29/2017 CLINICAL DATA:  Shortness of breath and cough EXAM: CHEST - 2 VIEW  COMPARISON:  07/29/2017, 07/09/2017 FINDINGS: Small pleural effusions. Cardiomegaly with mild vascular congestion. Increased interstitial opacities. Aortic atherosclerosis. No pneumothorax. IMPRESSION: 1. Tiny pleural effusions 2. Cardiomegaly with vascular congestion 3. Mild diffuse interstitial opacity, may reflect minimal interstitial edema versus interstitial inflammation. Electronically Signed   By: Donavan Foil M.D.   On: 08/29/2017 03:18     Cardiac Studies   ECHO:  ordered  Patient Profile     77 y.o. male w/ hx severe COPD, DM, CKD, HTN, OSA, D-CHF, and atrial flutter who presented 07/25 with worsening dyspnea and acute on chronic HF.   Assessment & Plan    1.  Acute on chronic diastolic CHF -His weight was 221 pounds at last office visit in January.  Weight on admit 220 pounds>>221 lbs -He only  appears mildly volume overloaded on exam with some mild lower extremity edema.  He still though has shortness of breath chest x-ray was consistent with mild volume overload He is essentially at his baseline weight compared to January. -He reports compliance with his medications as well as sodium and fluid intake.  -He had been taking extra Lasix recently at home. -I/O 2306 Marland Kitchen/2450, po intake was 1920 cc yesterday -Creatinine was 4.31 on 11/09/2016 but is down to 2.3 on admission. -Continue IV Lasix 40 mg bid and watch renal function and I&O's closely. -Repeat 2D echocardiogram to make sure LV function is not declined - try to obtain scales - educated on diet/sodium/fluid restrictions  2.  Paroxysmal atrial fibrillation -He was in normal sinus rhythm by EKG in January 2019. -He was on amiodarone in the past but it was stopped in the fall because he was maintaining sinus rhythm. -Rate is controlled today -Cannot perform cardioversion at this time as his INR is subtherapeutic.  He would need TEE/DCCV. -Continue on Coumadin with dosing per pharmacy (INR 1.82 today) -Once INR greater than 2 for 4 weeks, consider restarting amiodarone with subsequent cardioversion  3.  Chronic kidney disease -Appears around 2 although it was as high as 4 last fall  4.  Obstructive sleep apnea on CPAP -He is not fully compliant and only uses a few nights a week -Urged him to be more compliant as this will likely decrease the recurrence of atrial fibrillation  5.  Hyperlipidemia -LDL goal less than 70 given underlying diabetes -LDL was 124 in January and Crestor was increased to 20 mg daily -Repeat FLP in am w/ LFTs  6.  Elevated troponin -Troponin mildly elevated at 0.05>>0.04 -Likely related to demand ischemia in the setting of new onset atrial fibrillation as well as CHF in setting of chronic kidney disease -No ischemia on nuclear stress test last fall -He had some mild chest pressure over the  past few days but likely related to his CHF -Repeat 2D echocardiogram to assess for wall motion abnormalities. - w/ elevated Cr, initial trial of med rx indicated, add Imdur 30 mg for both CHF and ?CAD  Otherwise, per IM Principal Problem:   Respiratory distress Active Problems:   Benign essential hypertension   Persistent atrial fibrillation (HCC)   Morbid obesity (HCC)   Hypothyroidism   Diabetes mellitus with renal complications (HCC)   Blood clotting disorder (HCC)   Acute on chronic diastolic heart failure (HCC)   Hypokalemia   Diabetic ulcer of lower leg (HCC)   CKD (chronic kidney disease), stage III (HCC)   Acute on chronic diastolic congestive heart failure (HCC)   Elevated troponin I level  Jonetta Speak , PA-C 8:31 AM 08/30/2017 Pager: [phone]

## 2017-08-30 NOTE — Progress Notes (Signed)
Pt has been every 4 hours and order expired, blood sugar 105 this am, paged MD to advise re orders for blood sugars

## 2017-08-30 NOTE — Progress Notes (Signed)
Inpatient Diabetes Program Recommendations  AACE/ADA: New Consensus Statement on Inpatient Glycemic Control (2015)  Target Ranges:  Prepandial:   less than 140 mg/dL      Peak postprandial:   less than 180 mg/dL (1-2 hours)      Critically ill patients:  140 - 180 mg/dL   Lab Results  Component Value Date   GLUCAP 105 (H) 08/30/2017   HGBA1C 6.0 (H) 07/29/2017   Review of Glycemic Control  Consult for Fluctuating blood sugar  Per DM coordinators notes from yesterday. Patient's renal function was elevated resulting in the patients hypoglycemia. A1c 6%, indicating patient having hypoglycemia at home as well. A target more toward 7-8% is more appropriate in this population especially if he lives by himself. Consider d/c tradjenta home med and f/u with PCP who manages his DM.  Thanks,  Tama Headings RN, MSN, BC-ADM, [facility] Inpatient Diabetes Coordinator Team Pager [phone] (8a-5p)

## 2017-08-31 DIAGNOSIS — E876 Hypokalemia: Secondary | ICD-10-CM

## 2017-08-31 LAB — LIPID PANEL
CHOL/HDL RATIO: 1.6 ratio
Cholesterol: 135 mg/dL (ref 0–200)
HDL: 84 mg/dL (ref >40)
LDL Cholesterol: 44 mg/dL (ref 0–99)
Triglycerides: 34 mg/dL (ref <150)
VLDL: 7 mg/dL (ref 0–40)

## 2017-08-31 LAB — GLUCOSE, CAPILLARY
GLUCOSE-CAPILLARY: 87 mg/dL (ref 70–99)
GLUCOSE-CAPILLARY: 71 mg/dL (ref 70–99)
Glucose-Capillary: 111 mg/dL — ABNORMAL HIGH (ref 70–99)
Glucose-Capillary: 92 mg/dL (ref 70–99)
Glucose-Capillary: 83 mg/dL (ref 70–99)
Glucose-Capillary: 79 mg/dL (ref 70–99)

## 2017-08-31 LAB — CBC
HEMATOCRIT: 36.6 % — AB (ref 39.0–52.0)
Hemoglobin: 10.7 g/dL — ABNORMAL LOW (ref 13.0–17.0)
MCH: 30 pg (ref 26.0–34.0)
MCHC: 29.2 g/dL — AB (ref 30.0–36.0)
MCV: 102.5 fL — AB (ref 78.0–100.0)
Platelets: 158 10*3/uL (ref 150–400)
RBC: 3.57 MIL/uL — ABNORMAL LOW (ref 4.22–5.81)
RDW: 18.2 % — AB (ref 11.5–15.5)
WBC: 4.2 10*3/uL (ref 4.0–10.5)

## 2017-08-31 LAB — BASIC METABOLIC PANEL
Anion gap: 11 (ref 5–15)
BUN: 40 mg/dL — AB (ref 8–23)
CHLORIDE: 96 mmol/L — AB (ref 98–111)
CO2: 35 mmol/L — ABNORMAL HIGH (ref 22–32)
Calcium: 9.3 mg/dL (ref 8.9–10.3)
Creatinine, Ser: 2.37 mg/dL — ABNORMAL HIGH (ref 0.61–1.24)
GFR calc Af Amer: 29 mL/min — ABNORMAL LOW (ref >60)
GFR, EST NON AFRICAN AMERICAN: 25 mL/min — AB (ref >60)
Glucose, Bld: 101 mg/dL — ABNORMAL HIGH (ref 70–99)
POTASSIUM: 4 mmol/L (ref 3.5–5.1)
Sodium: 142 mmol/L (ref 135–145)

## 2017-08-31 LAB — PROTIME-INR
INR: 1.87
Prothrombin Time: 21.4 seconds — ABNORMAL HIGH (ref 11.4–15.2)

## 2017-08-31 MED ORDER — WARFARIN SODIUM 5 MG PO TABS
5.0000 mg | ORAL_TABLET | Freq: Once | ORAL | Status: AC
  Administered 2017-08-31: 5 mg via ORAL
  Filled 2017-08-31: qty 1

## 2017-08-31 MED ORDER — IPRATROPIUM-ALBUTEROL 0.5-2.5 (3) MG/3ML IN SOLN: 3.0000 mL | RESPIRATORY_TRACT | Status: DC | PRN

## 2017-08-31 MED ORDER — TORSEMIDE 20 MG PO TABS
20.0000 mg | ORAL_TABLET | Freq: Two times a day (BID) | ORAL | Status: DC
  Administered 2017-09-01: 20 mg via ORAL
  Filled 2017-08-31: qty 1

## 2017-08-31 NOTE — Evaluation (Signed)
Physical Therapy Evaluation Patient Details Name: Omar Lambert MRN: [id number] DOB: 1940/02/16 Today's Date: 08/31/2017   History of Present Illness  Patient is a 77 year old African-American male, morbidly obese, with past medical history significant for COPD, asthma, atrial fibrillation, hypertension, chronic kidney disease stage III/IV, pulmonary hypertension, right ventricular failure.  Patient is also documented to have diastolic heart failure.  Patient has OSA, been noncompliant with the CPAP machine at home.  Patient was admitted with heart failure exacerbation.      Clinical Impression  Patient evaluated by Physical Therapy with no further acute PT needs identified. All education has been completed and the patient has no further questions. Patient ambulating unit without AD, no SOB or DOE on RA, HRmax 95. DGI score 23/24, pt reports he is near baseline mobility. See below for any follow-up Physical Therapy or equipment needs. PT is signing off. Thank you for this referral.     Follow Up Recommendations No PT follow up;Supervision for mobility/OOB    Equipment Recommendations  None recommended by PT    Recommendations for Other Services       Precautions / Restrictions Restrictions Weight Bearing Restrictions: No      Mobility  Bed Mobility               General bed mobility comments: OOB at entry  Transfers Overall transfer level: Modified independent Equipment used: None                Ambulation/Gait Ambulation/Gait assistance: Supervision Gait Distance (Feet): 300 Feet Assistive device: None Gait Pattern/deviations: Step-through pattern Gait velocity: decrease   General Gait Details: mild decrease in speed, 23/24 DGI score, no DOE or SOB, no overt LOB.   Stairs            Wheelchair Mobility    Modified Rankin (Stroke Patients Only)       Balance Overall balance assessment: Mild deficits observed, not formally tested                                            Pertinent Vitals/Pain Pain Assessment: No/denies pain    Home Living Family/patient expects to be discharged to:: Private residence Living Arrangements: Spouse/significant other Available Help at Discharge: Family;Available 24 hours/day Type of Home: House Home Access: Level entry     Home Layout: One level Home Equipment: Cane - single point;Walker - 2 wheels      Prior Function Level of Independence: Independent         Comments: Pt reports independence with mobility and ADL, no AD, denies falls.      Hand Dominance        Extremity/Trunk Assessment   Upper Extremity Assessment Upper Extremity Assessment: Overall WFL for tasks assessed    Lower Extremity Assessment Lower Extremity Assessment: Overall WFL for tasks assessed       Communication   Communication: No difficulties  Cognition Arousal/Alertness: Awake/alert Behavior During Therapy: WFL for tasks assessed/performed Overall Cognitive Status: Within Functional Limits for tasks assessed                                        General Comments      Exercises     Assessment/Plan    PT Assessment Patent does not need any further  PT services  PT Problem List         PT Treatment Interventions      PT Goals (Current goals can be found in the Care Plan section)  Acute Rehab PT Goals Patient Stated Goal: go home when ready PT Goal Formulation: With patient Time For Goal Achievement: 09/07/17 Potential to Achieve Goals: Good    Frequency     Barriers to discharge        Co-evaluation               AM-PAC PT "6 Clicks" Daily Activity  Outcome Measure Difficulty turning over in bed (including adjusting bedclothes, sheets and blankets)?: A Little Difficulty moving from lying on back to sitting on the side of the bed? : A Little Difficulty sitting down on and standing up from a chair with arms (e.g., wheelchair, bedside  commode, etc,.)?: A Little Help needed moving to and from a bed to chair (including a wheelchair)?: None Help needed walking in hospital room?: None Help needed climbing 3-5 steps with a railing? : A Little 6 Click Score: 20    End of Session Equipment Utilized During Treatment: Gait belt Activity Tolerance: Patient tolerated treatment well Patient left: in chair;with call bell/phone within reach Nurse Communication: Mobility status PT Visit Diagnosis: Unsteadiness on feet (R26.81)    Time: 6712-4580 PT Time Calculation (min) (ACUTE ONLY): 19 min   Charges:   PT Evaluation $PT Eval Low Complexity: 1 Low          Reinaldo Berber, PT, DPT Acute Rehab Services Pager: [phone]    Reinaldo Berber 08/31/2017, 11:36 AM

## 2017-08-31 NOTE — Plan of Care (Signed)
  Problem: Education: Goal: Knowledge of General Education information will improve Description Including pain rating scale, medication(s)/side effects and non-pharmacologic comfort measures Outcome: Progressing Note:  POC reviewed with pt.   

## 2017-08-31 NOTE — Progress Notes (Signed)
ANTICOAGULATION CONSULT NOTE   Pharmacy Consult for warfarin dosing Indication: atrial fibrillation  No Known Allergies  Patient Measurements: Height: 5\' 5"  (165.1 cm) Weight: 221 lb 9.6 oz (100.5 kg) IBW/kg (Calculated) : 61.5  Vital Signs: Temp: 97.6 F (36.4 C) (07/27 0920) Temp Source: Oral (07/27 0920) BP: 120/75 (07/27 0920) Pulse Rate: 78 (07/27 0920)  Labs: Recent Labs    08/29/17 0306 08/29/17 0428 08/29/17 0904 08/30/17 0556 08/30/17 0719 08/31/17 0508  HGB 10.9*  --   --  11.0*  --  10.7*  HCT 36.9*  --   --  37.5*  --  36.6*  PLT 151  --   --  144*  --  158  LABPROT  --  20.9*  --   --  20.8* 21.4*  INR  --  1.82  --   --  1.81 1.87  CREATININE 2.30*  --   --  2.25*  --  2.37*  TROPONINI  --  0.05* 0.04*  --   --   --     Estimated Creatinine Clearance: 28 mL/min (A) (by C-G formula based on SCr of 2.37 mg/dL (H)).   Medical History: Past Medical History:  Diagnosis Date  . Acute kidney injury superimposed on chronic kidney disease ([place]) 11/2016  . Acute respiratory failure ([place]) 11/2016  . Allergy   . Asthma   . Atrial fibrillation ([place])   . Atrophic gastritis   . Benign essential hypertension   . CHF (congestive heart failure) ([place])   . CKD (chronic kidney disease), stage III ([place])   . COPD (chronic obstructive pulmonary disease) ([place])   . Diabetes ([place])   . Diabetes mellitus without complication ([place])   . Dyspnea   . GERD (gastroesophageal reflux disease)   . Gout attack 09/2012  . Sinusitis, maxillary, chronic   . Thyroid disease   . Type II or unspecified type diabetes mellitus without mention of complication, not stated as uncontrolled     Medications:  Medications Prior to Admission  Medication Sig Dispense Refill Last Dose  . acetaminophen (TYLENOL) 325 MG tablet Take 650 mg by mouth every 6 (six) hours as needed for mild pain.   Past Week at Unknown time  . albuterol (PROVENTIL) (2.5 MG/3ML) 0.083% nebulizer solution INHALE  CONTENTS OF 1 VIAL IN NEBULIZER EVERY 4 HOURS AS NEEDED FOR WHEEZING AND ASTHMA 75 mL 4 Past Week at Unknown time  . allopurinol (ZYLOPRIM) 300 MG tablet TAKE 1 TABLET BY MOUTH EVERY DAY 30 tablet 6 08/28/2017 at Unknown time  . calcitRIOL (ROCALTROL) 0.25 MCG capsule Take 0.25 mcg by mouth daily.    08/28/2017 at Unknown time  . KLOR-CON M20 20 MEQ tablet TAKE 1 TABLET BY MOUTH EVERY DAY 30 tablet 1 08/28/2017 at Unknown time  . levothyroxine (SYNTHROID, LEVOTHROID) 175 MCG tablet TAKE 1 TABLET EVERY DAY BEFORE BREAKFAST 30 tablet 3 08/28/2017 at Unknown time  . linagliptin (TRADJENTA) 5 MG TABS tablet Take 1 tablet (5 mg total) by mouth daily. 90 tablet 3 08/28/2017 at Unknown time  . Multiple Vitamins-Minerals (CENTRUM SILVER ADULT 50+ PO) Take 1 tablet by mouth daily.   08/28/2017 at Unknown time  . pantoprazole (PROTONIX) 40 MG tablet TAKE 1 TABLET BY MOUTH EVERY DAY BEFORE SUPPER 90 tablet 0 08/28/2017 at Unknown time  . rosuvastatin (CRESTOR) 20 MG tablet Take 1 tablet (20 mg total) by mouth daily. Need OV for further refills 30 tablet 2 08/28/2017 at Unknown time  . torsemide (DEMADEX) 20  MG tablet TAKE 1 TABLET TWICE A DAY 60 tablet 3 08/28/2017 at Unknown time  . warfarin (COUMADIN) 5 MG tablet Take 1/2 to 1 tablet by mouth daily as directed by coumadin clinic (Patient taking differently: Take 2.5-5 mg by mouth daily. ) 30 tablet 2 08/28/2017 at 0900    Assessment: 77 yo male on warfarin for a fib presented with worsening shortness of breath, edema, and weight gain.   Patient reports taking 5mg  M,W,F and 2.5mg  STThSa. However, the most recent clinic visit has 5mg  WF and 2.5 SMTThSa.    Patient's INR subtherapeutic at 1.87. CBC stable.   Goal of Therapy:  INR 2-3 Monitor platelets by anticoagulation protocol: Yes   Plan:  Give warfarin 5mg  today Daily INR and CBC    [name], Pharm D PGY1 Pharmacy Resident  Please check amion for the clinical pharmacist number 08/31/2017      10:14  AM

## 2017-08-31 NOTE — Progress Notes (Addendum)
DAILY PROGRESS NOTE   Patient Name: Omar Lambert Date of Encounter: 08/31/2017  Chief Complaint   I feel fine  Patient Profile   77 yo male with PMH of severe COPD, DM, CKD, HTN, OSA and atrial flutter who presented with worsening dyspnea and acute on chronic HF.  Subjective   Recorded net positive overnight. Weight has remained stable. Creatinine rose from 2.25 to 2.37. INR up to 1.87 today.  Objective   Vitals:   08/30/17 1932 08/30/17 2137 08/31/17 0524 08/31/17 0920  BP:  120/79 135/76 120/75  Pulse:  76 79 78  Resp:  18 18   Temp:  97.6 F (36.4 C) 97.9 F (36.6 C) 97.6 F (36.4 C)  TempSrc:  Oral Oral Oral  SpO2: 94% 92% 93% 94%  Weight:   221 lb 9.6 oz (100.5 kg)   Height:        Intake/Output Summary (Last 24 hours) at 08/31/2017 1057 Last data filed at 08/31/2017 0926 Gross per 24 hour  Intake 1575 ml  Output 1850 ml  Net -275 ml   Filed Weights   08/29/17 0656 08/30/17 0357 08/31/17 0524  Weight: 220 lb 9.6 oz (100.1 kg) 221 lb (100.2 kg) 221 lb 9.6 oz (100.5 kg)    Physical Exam   General appearance: alert and no distress Lungs: diminished breath sounds RLL and rales RLL Heart: irregularly irregular rhythm Extremities: extremities normal, atraumatic, no cyanosis or edema Neurologic: Grossly normal  Inpatient Medications    Scheduled Meds: . allopurinol  300 mg Oral Daily  . calcitRIOL  0.25 mcg Oral Daily  . furosemide  40 mg Intravenous BID  . ipratropium-albuterol  3 mL Nebulization BID  . isosorbide mononitrate  30 mg Oral Daily  . levothyroxine  175 mcg Oral QAC breakfast  . potassium chloride SA  20 mEq Oral Daily  . rosuvastatin  20 mg Oral Daily  . sodium chloride flush  3 mL Intravenous Q12H  . warfarin  5 mg Oral ONCE-1800  . Warfarin - Pharmacist Dosing Inpatient   Does not apply q1800    Continuous Infusions: . sodium chloride      PRN Meds: sodium chloride, acetaminophen, ondansetron (ZOFRAN) IV, sodium chloride  flush   Labs   Results for orders placed or performed during the hospital encounter of 08/29/17 (from the past 48 hour(s))  Glucose, capillary     Status: Abnormal   Collection Time: 08/29/17 12:06 PM  Result Value Ref Range   Glucose-Capillary 37 (LL) 70 - 99 mg/dL   Comment 1 Notify RN   Glucose, capillary     Status: Abnormal   Collection Time: 08/29/17 12:30 PM  Result Value Ref Range   Glucose-Capillary 26 (LL) 70 - 99 mg/dL   Comment 1 Notify RN   Glucose, capillary     Status: None   Collection Time: 08/29/17 12:47 PM  Result Value Ref Range   Glucose-Capillary 85 70 - 99 mg/dL  Glucose, capillary     Status: None   Collection Time: 08/29/17  1:42 PM  Result Value Ref Range   Glucose-Capillary 89 70 - 99 mg/dL  Glucose, capillary     Status: Abnormal   Collection Time: 08/29/17  4:14 PM  Result Value Ref Range   Glucose-Capillary 59 (L) 70 - 99 mg/dL  Glucose, capillary     Status: None   Collection Time: 08/29/17  5:30 PM  Result Value Ref Range   Glucose-Capillary 98 70 - 99 mg/dL  Glucose, capillary     Status: None   Collection Time: 08/29/17  7:20 PM  Result Value Ref Range   Glucose-Capillary 96 70 - 99 mg/dL  Glucose, capillary     Status: None   Collection Time: 08/29/17  7:49 PM  Result Value Ref Range   Glucose-Capillary 81 70 - 99 mg/dL  Glucose, capillary     Status: None   Collection Time: 08/30/17 12:41 AM  Result Value Ref Range   Glucose-Capillary 99 70 - 99 mg/dL   Comment 1 Notify RN    Comment 2 Document in Chart   Glucose, capillary     Status: Abnormal   Collection Time: 08/30/17  4:00 AM  Result Value Ref Range   Glucose-Capillary 168 (H) 70 - 99 mg/dL   Comment 1 Notify RN    Comment 2 Document in Chart   Basic metabolic panel     Status: Abnormal   Collection Time: 08/30/17  5:56 AM  Result Value Ref Range   Sodium 143 135 - 145 mmol/L   Potassium 3.8 3.5 - 5.1 mmol/L   Chloride 95 (L) 98 - 111 mmol/L   CO2 36 (H) 22 - 32 mmol/L     Glucose, Bld 116 (H) 70 - 99 mg/dL   BUN 38 (H) 8 - 23 mg/dL   Creatinine, Ser 2.25 (H) 0.61 - 1.24 mg/dL   Calcium 9.7 8.9 - 10.3 mg/dL   GFR calc non Af Amer 26 (L) >60 mL/min   GFR calc Af Amer 30 (L) >60 mL/min    Comment: (NOTE) The eGFR has been calculated using the CKD EPI equation. This calculation has not been validated in all clinical situations. eGFR's persistently <60 mL/min signify possible Chronic Kidney Disease.    Anion gap 12 5 - 15    Comment: Performed at [facility] [address], [place], [place] [address]  CBC     Status: Abnormal   Collection Time: 08/30/17  5:56 AM  Result Value Ref Range   WBC 4.2 4.0 - 10.5 K/uL   RBC 3.64 (L) 4.22 - 5.81 MIL/uL   Hemoglobin 11.0 (L) 13.0 - 17.0 g/dL   HCT 37.5 (L) 39.0 - 52.0 %   MCV 103.0 (H) 78.0 - 100.0 fL   MCH 30.2 26.0 - 34.0 pg   MCHC 29.3 (L) 30.0 - 36.0 g/dL   RDW 18.4 (H) 11.5 - 15.5 %   Platelets 144 (L) 150 - 400 K/uL    Comment: Performed at [facility], [place] [address] [place], [place] [address]  Lipid panel     Status: None   Collection Time: 08/30/17  5:56 AM  Result Value Ref Range   Cholesterol 142 0 - 200 mg/dL   Triglycerides 21 <150 mg/dL   HDL 87 >40 mg/dL   Total CHOL/HDL Ratio 1.6 RATIO   VLDL 4 0 - 40 mg/dL   LDL Cholesterol 51 0 - 99 mg/dL    Comment:        Total Cholesterol/HDL:CHD Risk Coronary Heart Disease Risk Table                     Men   Women  1/2 Average Risk   3.4   3.3  Average Risk       5.0   4.4  2 X Average Risk   9.6   7.1  3 X Average Risk  23.4   11.0  Use the calculated Patient Ratio above and the CHD Risk Table to determine the patient's CHD Risk.        ATP III CLASSIFICATION (LDL):  <100     mg/dL   Optimal  100-129  mg/dL   Near or Above                    Optimal  130-159  mg/dL   Borderline  160-189  mg/dL   High  >190     mg/dL   Very High Performed at [facility] [address] [place], [place] [address]    Glucose, capillary     Status: Abnormal   Collection Time: 08/30/17  7:17 AM  Result Value Ref Range   Glucose-Capillary 105 (H) 70 - 99 mg/dL  Protime-INR     Status: Abnormal   Collection Time: 08/30/17  7:19 AM  Result Value Ref Range   Prothrombin Time 20.8 (H) 11.4 - 15.2 seconds   INR 1.81     Comment: Performed at [facility], [place] [address] [place], [place] [address]  Glucose, capillary     Status: None   Collection Time: 08/30/17 11:34 AM  Result Value Ref Range   Glucose-Capillary 77 70 - 99 mg/dL  Glucose, capillary     Status: None   Collection Time: 08/30/17  4:28 PM  Result Value Ref Range   Glucose-Capillary 82 70 - 99 mg/dL  Glucose, capillary     Status: None   Collection Time: 08/30/17  9:07 PM  Result Value Ref Range   Glucose-Capillary 79 70 - 99 mg/dL  Glucose, capillary     Status: Abnormal   Collection Time: 08/31/17  1:09 AM  Result Value Ref Range   Glucose-Capillary 111 (H) 70 - 99 mg/dL  Basic metabolic panel     Status: Abnormal   Collection Time: 08/31/17  5:08 AM  Result Value Ref Range   Sodium 142 135 - 145 mmol/L   Potassium 4.0 3.5 - 5.1 mmol/L   Chloride 96 (L) 98 - 111 mmol/L   CO2 35 (H) 22 - 32 mmol/L   Glucose, Bld 101 (H) 70 - 99 mg/dL   BUN 40 (H) 8 - 23 mg/dL   Creatinine, Ser 2.37 (H) 0.61 - 1.24 mg/dL   Calcium 9.3 8.9 - 10.3 mg/dL   GFR calc non Af Amer 25 (L) >60 mL/min   GFR calc Af Amer 29 (L) >60 mL/min    Comment: (NOTE) The eGFR has been calculated using the CKD EPI equation. This calculation has not been validated in all clinical situations. eGFR's persistently <60 mL/min signify possible Chronic Kidney Disease.    Anion gap 11 5 - 15    Comment: Performed at [facility] [address] [place], [place] [address]  CBC     Status: Abnormal   Collection Time: 08/31/17  5:08 AM  Result Value Ref Range   WBC 4.2 4.0 - 10.5 K/uL   RBC 3.57 (L) 4.22 - 5.81 MIL/uL   Hemoglobin 10.7 (L) 13.0 - 17.0 g/dL    HCT 36.6 (L) 39.0 - 52.0 %   MCV 102.5 (H) 78.0 - 100.0 fL   MCH 30.0 26.0 - 34.0 pg   MCHC 29.2 (L) 30.0 - 36.0 g/dL   RDW 18.2 (H) 11.5 - 15.5 %   Platelets 158 150 - 400 K/uL    Comment: Performed at [facility], [place] [address],  [place], [place] [address]  Protime-INR     Status: Abnormal   Collection Time: 08/31/17  5:08 AM  Result Value Ref Range   Prothrombin Time 21.4 (H) 11.4 - 15.2 seconds   INR 1.87     Comment: Performed at [facility] [address], [place], [place] [address]  Lipid panel     Status: None   Collection Time: 08/31/17  5:08 AM  Result Value Ref Range   Cholesterol 135 0 - 200 mg/dL   Triglycerides 34 <150 mg/dL   HDL 84 >40 mg/dL   Total CHOL/HDL Ratio 1.6 RATIO   VLDL 7 0 - 40 mg/dL   LDL Cholesterol 44 0 - 99 mg/dL    Comment:        Total Cholesterol/HDL:CHD Risk Coronary Heart Disease Risk Table                     Men   Women  1/2 Average Risk   3.4   3.3  Average Risk       5.0   4.4  2 X Average Risk   9.6   7.1  3 X Average Risk  23.4   11.0        Use the calculated Patient Ratio above and the CHD Risk Table to determine the patient's CHD Risk.        ATP III CLASSIFICATION (LDL):  <100     mg/dL   Optimal  100-129  mg/dL   Near or Above                    Optimal  130-159  mg/dL   Borderline  160-189  mg/dL   High  >190     mg/dL   Very High Performed at [facility] [address], [place], [place] [address]   Glucose, capillary     Status: None   Collection Time: 08/31/17  5:22 AM  Result Value Ref Range   Glucose-Capillary 92 70 - 99 mg/dL  Glucose, capillary     Status: None   Collection Time: 08/31/17  7:23 AM  Result Value Ref Range   Glucose-Capillary 87 70 - 99 mg/dL   Comment 1 Notify RN    Comment 2 Document in Chart     ECG   N/A  Telemetry   Afib with CVR - Personally Reviewed  Radiology    No results found.  Cardiac Studies   Echo pending  Assessment   1. Principal  Problem: 2.   Respiratory distress 3. Active Problems: 4.   Benign essential hypertension 5.   Persistent atrial fibrillation ([place]) 6.   Morbid obesity ([place]) 7.   Hypothyroidism 8.   Diabetes mellitus with renal complications ([place]) 9.   Blood clotting disorder ([place]) 10.   Acute on chronic diastolic heart failure ([place]) 11.   Hypokalemia 12.   Diabetic ulcer of lower leg ([place]) 13.   CKD (chronic kidney disease), stage III ([place]) 14.   Acute on chronic diastolic congestive heart failure ([place]) 15.   Elevated troponin I level 16.   Plan   1. Creatinine rising with diuresis. Hold IV lasix today- No clinical change in basilar rales. Restart home torsemide tomorrow. Echo pending.  Time Spent Directly with Patient:  I have spent a total of 25 minutes with the patient reviewing hospital notes, telemetry, EKGs, labs and examining the patient as well as establishing an assessment and plan that was  discussed personally with the patient.  > 50% of time was spent in direct patient care.  Length of Stay:  LOS: 2 days   Pixie Casino, MD, [facility], [place] Director of the Advanced Lipid Disorders &  Cardiovascular Risk Reduction Clinic Diplomate of the American Board of Clinical Lipidology Attending Cardiologist  Direct Dial: [phone]  Fax: [phone]  Website:  [email or web address] 08/31/2017, 10:57 AM

## 2017-08-31 NOTE — Progress Notes (Signed)
PROGRESS NOTE    Omar Lambert  KTG:[id number] DOB: 1940-11-16 DOA: 08/29/2017 PCP: Gayland Curry, DO  Outpatient Specialists:     Brief Narrative:  Patient is a 77 year old African-American male, morbidly obese, with past medical history significant for COPD, asthma, atrial fibrillation, hypertension, chronic kidney disease stage III/IV, pulmonary hypertension, right ventricular failure.  Patient is also documented to have diastolic heart failure.  Patient has OSA, been noncompliant with the CPAP machine at home.  Patient was admitted with heart failure exacerbation.  08/31/2017: Patient seen alongside patient's wife.  Patient is improving.  Edema is improving.  Cardiology input is highly appreciated.  Assessment & Plan:   Principal Problem:   Respiratory distress Active Problems:   Benign essential hypertension   Persistent atrial fibrillation (HCC)   Morbid obesity (HCC)   Hypothyroidism   Diabetes mellitus with renal complications (HCC)   Blood clotting disorder (HCC)   Acute on chronic diastolic heart failure (HCC)   Hypokalemia   Diabetic ulcer of lower leg (HCC)   CKD (chronic kidney disease), stage III (HCC)   Acute on chronic diastolic congestive heart failure (HCC)   Elevated troponin I level   Acute on chronic diastolic heart failure:  -intake and output -Daily weights -I continue V Lasix. -Echocardiogram result is noted. -Cardiology input is appreciated  OSA not compliant with CPAP/Right heart failure: Patient counseled regarding need to comply with CPAP at home. -Follow-up with pulmonary team on discharge. -Will defer further work-up of right heart failure to cardiology and pulmonary team.  Hypokalemia: Resolved. Monitor electrolytes and replete.  Persistent atrial fib: Rate controlled.  chadvasc score 5.  INR 1.87. Optimize INR (Goal is 2-3) -Coumadin per pharmacy  Hypertension: .Controlled  -Monitor  Diabetes: - Low blood sugar noted  on presentation - Initially on Dextrose infusion -Blood sugar this morning was 105. - Hold Fluid - Monitor blood sugar closely -Obtain a hemoglobin A1c  Elevated troponin:  Initial troponin 0.05. Repeat Troponin is down to 0.04. Likely type II elevation  Chronic kidney disease stage III/IV: -Stable.  Obesity: Diet and exercise.   DVT prophylaxis: coumadin Code Status: DNR Family Communication: wife at bedside  Disposition Plan: home  Consults called: Cardiology Admission status: inpatient    Antimicrobials:   None    Subjective: SOB is improving.  Objective: Vitals:   08/30/17 2137 08/31/17 0524 08/31/17 0920 08/31/17 1153  BP: 120/79 135/76 120/75 (!) 131/94  Pulse: 76 79 78 76  Resp: 18 18  20   Temp: 97.6 F (36.4 C) 97.9 F (36.6 C) 97.6 F (36.4 C) (!) 97.5 F (36.4 C)  TempSrc: Oral Oral Oral Oral  SpO2: 92% 93% 94% 99%  Weight:  100.5 kg (221 lb 9.6 oz)    Height:        Intake/Output Summary (Last 24 hours) at 08/31/2017 1511 Last data filed at 08/31/2017 1200 Gross per 24 hour  Intake 855 ml  Output 2225 ml  Net -1370 ml   Filed Weights   08/29/17 0656 08/30/17 0357 08/31/17 0524  Weight: 100.1 kg (220 lb 9.6 oz) 100.2 kg (221 lb) 100.5 kg (221 lb 9.6 oz)    Examination:  General exam: Obese. Appears calm and comfortable  Respiratory system: Clear to auscultation. Cardiovascular system: S1 & S2 hear Gastrointestinal system: Abdomen is obese, soft and nontender. Organs are difficult to assess.  Central nervous system: Alert and oriented. No focal neurological deficits. Extremities: 2-2+ leg edema.    Data Reviewed: I have personally  reviewed following labs and imaging studies  CBC: Recent Labs  Lab 08/29/17 0306 08/30/17 0556 08/31/17 0508  WBC 4.6 4.2 4.2  HGB 10.9* 11.0* 10.7*  HCT 36.9* 37.5* 36.6*  MCV 102.8* 103.0* 102.5*  PLT 151 144* 891   Basic Metabolic Panel: Recent Labs  Lab 08/29/17 0306 08/29/17 0431  08/30/17 0556 08/31/17 0508  NA 143  --  143 142  K 3.3*  --  3.8 4.0  CL 96*  --  95* 96*  CO2 37*  --  36* 35*  GLUCOSE 102*  --  116* 101*  BUN 33*  --  38* 40*  CREATININE 2.30*  --  2.25* 2.37*  CALCIUM 9.4  --  9.7 9.3  MG  --  2.0  --   --    GFR: Estimated Creatinine Clearance: 28 mL/min (A) (by C-G formula based on SCr of 2.37 mg/dL (H)). Liver Function Tests: No results for input(s): AST, ALT, ALKPHOS, BILITOT, PROT, ALBUMIN in the last 168 hours. No results for input(s): LIPASE, AMYLASE in the last 168 hours. No results for input(s): AMMONIA in the last 168 hours. Coagulation Profile: Recent Labs  Lab 08/29/17 0428 08/30/17 0719 08/31/17 0508  INR 1.82 1.81 1.87   Cardiac Enzymes: Recent Labs  Lab 08/29/17 0428 08/29/17 0904  TROPONINI 0.05* 0.04*   BNP (last 3 results) No results for input(s): PROBNP in the last 8760 hours. HbA1C: No results for input(s): HGBA1C in the last 72 hours. CBG: Recent Labs  Lab 08/30/17 2107 08/31/17 0109 08/31/17 0522 08/31/17 0723 08/31/17 1203  GLUCAP 79 111* 92 87 71   Lipid Profile: Recent Labs    08/30/17 0556 08/31/17 0508  CHOL 142 135  HDL 87 84  LDLCALC 51 44  TRIG 21 34  CHOLHDL 1.6 1.6   Thyroid Function Tests: No results for input(s): TSH, T4TOTAL, FREET4, T3FREE, THYROIDAB in the last 72 hours. Anemia Panel: No results for input(s): VITAMINB12, FOLATE, FERRITIN, TIBC, IRON, RETICCTPCT in the last 72 hours. Urine analysis:    Component Value Date/Time   COLORURINE YELLOW 10/27/2016 0113   APPEARANCEUR CLEAR 10/27/2016 0113   LABSPEC 1.013 10/27/2016 0113   PHURINE 6.0 10/27/2016 0113   GLUCOSEU NEGATIVE 10/27/2016 0113   HGBUR NEGATIVE 10/27/2016 0113   BILIRUBINUR NEGATIVE 10/27/2016 0113   KETONESUR NEGATIVE 10/27/2016 0113   PROTEINUR 100 (A) 10/27/2016 0113   UROBILINOGEN 0.2 06/17/2012 0622   NITRITE NEGATIVE 10/27/2016 0113   LEUKOCYTESUR NEGATIVE 10/27/2016 0113   Sepsis  Labs: @LABRCNTIP (procalcitonin:4,lacticidven:4)  )No results found for this or any previous visit (from the past 240 hour(s)).       Radiology Studies: No results found.      Scheduled Meds: . allopurinol  300 mg Oral Daily  . calcitRIOL  0.25 mcg Oral Daily  . ipratropium-albuterol  3 mL Nebulization BID  . isosorbide mononitrate  30 mg Oral Daily  . levothyroxine  175 mcg Oral QAC breakfast  . potassium chloride SA  20 mEq Oral Daily  . rosuvastatin  20 mg Oral Daily  . sodium chloride flush  3 mL Intravenous Q12H  . [START ON 09/01/2017] torsemide  20 mg Oral BID  . warfarin  5 mg Oral ONCE-1800  . Warfarin - Pharmacist Dosing Inpatient   Does not apply q1800   Continuous Infusions: . sodium chloride       LOS: 2 days    Time spent: 72 Minutes    Dana Allan, MD  [facility] Pager #:  [phone] 7PM-7AM contact night coverage as above

## 2017-09-01 LAB — PROTIME-INR
INR: 1.97
Prothrombin Time: 22.2 seconds — ABNORMAL HIGH (ref 11.4–15.2)

## 2017-09-01 LAB — GLUCOSE, CAPILLARY
GLUCOSE-CAPILLARY: 111 mg/dL — AB (ref 70–99)
GLUCOSE-CAPILLARY: 75 mg/dL (ref 70–99)
Glucose-Capillary: 109 mg/dL — ABNORMAL HIGH (ref 70–99)
Glucose-Capillary: 89 mg/dL (ref 70–99)

## 2017-09-01 LAB — BASIC METABOLIC PANEL
Anion gap: 13 (ref 5–15)
BUN: 40 mg/dL — ABNORMAL HIGH (ref 8–23)
CALCIUM: 9.3 mg/dL (ref 8.9–10.3)
CHLORIDE: 95 mmol/L — AB (ref 98–111)
CO2: 31 mmol/L (ref 22–32)
CREATININE: 2.3 mg/dL — AB (ref 0.61–1.24)
GFR calc non Af Amer: 26 mL/min — ABNORMAL LOW (ref >60)
GFR, EST AFRICAN AMERICAN: 30 mL/min — AB (ref >60)
Glucose, Bld: 96 mg/dL (ref 70–99)
POTASSIUM: 4.9 mmol/L (ref 3.5–5.1)
SODIUM: 139 mmol/L (ref 135–145)

## 2017-09-01 MED ORDER — WARFARIN SODIUM 5 MG PO TABS: 5.0000 mg | ORAL_TABLET | Freq: Once | ORAL | Status: DC

## 2017-09-01 MED ORDER — ISOSORBIDE MONONITRATE ER 30 MG PO TB24: 30.0000 mg | ORAL_TABLET | Freq: Every day | ORAL | 0 refills | Status: DC

## 2017-09-01 NOTE — Progress Notes (Signed)
avs given to pt and wife, instructed on meds to stop and start and when due, also to make appt with pcp and cardiologist as it is Sunday, all questions entertained and answered, pt says he does not have glucometer at home, spokke to  [facility] who said we do not provide glucometers, discussed with pt and wife to go to [facility] etc and discuss which machine would be most cost efficient as strips are usually the most expensive cost, pt stable for discharge

## 2017-09-01 NOTE — Progress Notes (Signed)
ANTICOAGULATION CONSULT NOTE   Pharmacy Consult for warfarin dosing Indication: atrial fibrillation  No Known Allergies  Patient Measurements: Height: 5\' 5"  (165.1 cm) Weight: 223 lb 9.6 oz (101.4 kg) IBW/kg (Calculated) : 61.5  Vital Signs: Temp: 98 F (36.7 C) (07/28 0929) Temp Source: Oral (07/28 0929) BP: 121/59 (07/28 0929) Pulse Rate: 70 (07/28 0929)  Labs: Recent Labs    08/30/17 0556 08/30/17 0719 08/31/17 0508 09/01/17 0736  HGB 11.0*  --  10.7*  --   HCT 37.5*  --  36.6*  --   PLT 144*  --  158  --   LABPROT  --  20.8* 21.4* 22.2*  INR  --  1.81 1.87 1.97  CREATININE 2.25*  --  2.37*  --     Estimated Creatinine Clearance: 28.2 mL/min (A) (by C-G formula based on SCr of 2.37 mg/dL (H)).   Medical History: Past Medical History:  Diagnosis Date  . Acute kidney injury superimposed on chronic kidney disease ([place]) 11/2016  . Acute respiratory failure ([place]) 11/2016  . Allergy   . Asthma   . Atrial fibrillation ([place])   . Atrophic gastritis   . Benign essential hypertension   . CHF (congestive heart failure) ([place])   . CKD (chronic kidney disease), stage III ([place])   . COPD (chronic obstructive pulmonary disease) ([place])   . Diabetes ([place])   . Diabetes mellitus without complication ([place])   . Dyspnea   . GERD (gastroesophageal reflux disease)   . Gout attack 09/2012  . Sinusitis, maxillary, chronic   . Thyroid disease   . Type II or unspecified type diabetes mellitus without mention of complication, not stated as uncontrolled     Medications:  Medications Prior to Admission  Medication Sig Dispense Refill Last Dose  . acetaminophen (TYLENOL) 325 MG tablet Take 650 mg by mouth every 6 (six) hours as needed for mild pain.   Past Week at Unknown time  . albuterol (PROVENTIL) (2.5 MG/3ML) 0.083% nebulizer solution INHALE CONTENTS OF 1 VIAL IN NEBULIZER EVERY 4 HOURS AS NEEDED FOR WHEEZING AND ASTHMA 75 mL 4 Past Week at Unknown time  . allopurinol (ZYLOPRIM)  300 MG tablet TAKE 1 TABLET BY MOUTH EVERY DAY 30 tablet 6 08/28/2017 at Unknown time  . calcitRIOL (ROCALTROL) 0.25 MCG capsule Take 0.25 mcg by mouth daily.    08/28/2017 at Unknown time  . KLOR-CON M20 20 MEQ tablet TAKE 1 TABLET BY MOUTH EVERY DAY 30 tablet 1 08/28/2017 at Unknown time  . levothyroxine (SYNTHROID, LEVOTHROID) 175 MCG tablet TAKE 1 TABLET EVERY DAY BEFORE BREAKFAST 30 tablet 3 08/28/2017 at Unknown time  . linagliptin (TRADJENTA) 5 MG TABS tablet Take 1 tablet (5 mg total) by mouth daily. 90 tablet 3 08/28/2017 at Unknown time  . Multiple Vitamins-Minerals (CENTRUM SILVER ADULT 50+ PO) Take 1 tablet by mouth daily.   08/28/2017 at Unknown time  . pantoprazole (PROTONIX) 40 MG tablet TAKE 1 TABLET BY MOUTH EVERY DAY BEFORE SUPPER 90 tablet 0 08/28/2017 at Unknown time  . rosuvastatin (CRESTOR) 20 MG tablet Take 1 tablet (20 mg total) by mouth daily. Need OV for further refills 30 tablet 2 08/28/2017 at Unknown time  . torsemide (DEMADEX) 20 MG tablet TAKE 1 TABLET TWICE A DAY 60 tablet 3 08/28/2017 at Unknown time  . warfarin (COUMADIN) 5 MG tablet Take 1/2 to 1 tablet by mouth daily as directed by coumadin clinic (Patient taking differently: Take 2.5-5 mg by mouth daily. ) 30 tablet 2  08/28/2017 at 0900    Assessment: 77 yo male on warfarin for a fib presented with worsening shortness of breath, edema, and weight gain.   Patient reports taking 5mg  M,W,F and 2.5mg  STThSa. However, the most recent clinic visit has 5mg  WF and 2.5 SMTThSa.    Patient's INR slightly subtherapeutic at 1.97. CBC stable.   Goal of Therapy:  INR 2-3 Monitor platelets by anticoagulation protocol: Yes   Plan:  Give warfarin 5mg  today Daily INR   [name], Pharm D PGY1 Pharmacy Resident  Please check amion for the clinical pharmacist number 09/01/2017      9:30 AM

## 2017-09-01 NOTE — Discharge Summary (Signed)
Physician Discharge Summary  Patient ID: Omar Lambert MRN: [id number] DOB/AGE: 08-25-1940 77 y.o.  Admit date: 08/29/2017 Discharge date: 09/01/2017  Admission Diagnoses:  Discharge Diagnoses:  Principal Problem:   Acute on chronic diastolic heart failure   Active Problems:   Benign essential hypertension   Persistent atrial fibrillation (HCC)   Morbid obesity (HCC)   Hypothyroidism   Diabetes mellitus with renal complications (HCC)   Blood clotting disorder (HCC)   respiratory distress   Hypokalemia   Diabetic ulcer of lower leg (HCC)   CKD (chronic kidney disease), stage III (HCC)   Acute on chronic diastolic congestive heart failure (HCC)   Elevated troponin I level   Discharged Condition: stable  Hospital Course: Patient is a 77 year old African-American male, morbidly obese, with past medical history significant for COPD, asthma, atrial fibrillation, hypertension, chronic kidney disease stage III/IV, pulmonary hypertension, right ventricular failure.  Patient is also documented to have diastolic heart failure.  Patient has OSA, been noncompliant with the CPAP machine at home.  Patient was admitted with heart failure exacerbation.   Acute on chronic diastolic heart failure:  Patient was adequately diuresed.  Apparently, the patient was on torsemide prior to admission.  Patient was treated with IV Lasix.  Edema is improved significantly.  Respiratory symptoms have improved significantly.  Echocardiogram revealed moderate concentric left ventricular hypertrophy, EF of 50 to 92%, diastolic flattening of the ventricular septum that is consistent with right ventricular volume overload and PA peak pressure 47 mmHg.  Cardiology team was consulted to assist with patient's management.  Cardiology team is cleared patient for discharge.  Patient will be discharged on torsemide 20 mg p.o. twice daily.  There will be need for the primary care provider and the cardiology team to continue  monitoring patient's volume status, renal function and electrolytes.  Patient has also been advised to comply with CPAP.  OSA not compliant with CPAP/Right heart failure: Patient counseled regarding need to comply with CPAP at home. Follow-up with pulmonary team on discharge.  Hypokalemia: Resolved. Monitor electrolytes and replete.  Persistent atrial fib: Rate controlled.  chadvasc score 5. Optimize INR (Goal is 2-3)  Hypertension: .Controlled   Diabetes: - Low blood sugar noted on presentation - Initially on Dextrose infusion -Patient [name] will be on hold.  Patient has been advised to check blood sugar at least 4 times daily, document readings, and reviewed readings with the primary care provider.    Elevated troponin:  Initial troponin 0.05. Repeat Troponin is down to 0.04. Likely type II elevation  Chronic kidney disease stage III/IV: -Stable.  Obesity: Diet and exercise.  Consults: cardiology  Significant Diagnostic Studies: cardiac graphics: Echocardiogram: See above.   Discharge Exam: Blood pressure 127/78, pulse 65, temperature 97.7 F (36.5 C), temperature source Oral, resp. rate 20, height 5\' 5"  (1.651 m), weight 101.4 kg (223 lb 9.6 oz), SpO2 98 %.   Disposition: Discharge disposition: 01-Home or Self Care   Discharge Instructions    Call MD for:   Complete by:  As directed    Please call MD for worsening of symptoms   Diet - low sodium heart healthy   Complete by:  As directed    Discharge instructions   Complete by:  As directed    Check blood sugar at least 4 times daily for the next 1 week, document readings, review readings with your primary care provider.   Increase activity slowly   Complete by:  As directed      Allergies  as of 09/01/2017   No Known Allergies     Medication List    STOP taking these medications   linagliptin 5 MG Tabs tablet Commonly known as:  TRADJENTA     TAKE these medications   acetaminophen  325 MG tablet Commonly known as:  TYLENOL Take 650 mg by mouth every 6 (six) hours as needed for mild pain.   albuterol (2.5 MG/3ML) 0.083% nebulizer solution Commonly known as:  PROVENTIL INHALE CONTENTS OF 1 VIAL IN NEBULIZER EVERY 4 HOURS AS NEEDED FOR WHEEZING AND ASTHMA   allopurinol 300 MG tablet Commonly known as:  ZYLOPRIM TAKE 1 TABLET BY MOUTH EVERY DAY   calcitRIOL 0.25 MCG capsule Commonly known as:  ROCALTROL Take 0.25 mcg by mouth daily.   CENTRUM SILVER ADULT 50+ PO Take 1 tablet by mouth daily.   isosorbide mononitrate 30 MG 24 hr tablet Commonly known as:  IMDUR Take 1 tablet (30 mg total) by mouth daily. Start taking on:  09/02/2017   KLOR-CON M20 20 MEQ tablet Generic drug:  potassium chloride SA TAKE 1 TABLET BY MOUTH EVERY DAY   levothyroxine 175 MCG tablet Commonly known as:  SYNTHROID, LEVOTHROID TAKE 1 TABLET EVERY DAY BEFORE BREAKFAST   pantoprazole 40 MG tablet Commonly known as:  PROTONIX TAKE 1 TABLET BY MOUTH EVERY DAY BEFORE SUPPER   rosuvastatin 20 MG tablet Commonly known as:  CRESTOR Take 1 tablet (20 mg total) by mouth daily. Need OV for further refills   torsemide 20 MG tablet Commonly known as:  DEMADEX TAKE 1 TABLET TWICE A DAY   warfarin 5 MG tablet Commonly known as:  COUMADIN Take as directed. If you are unsure how to take this medication, talk to your nurse or doctor. Original instructions:  Take 1/2 to 1 tablet by mouth daily as directed by coumadin clinic What changed:    how much to take  how to take this  when to take this  additional instructions      32 minutes spent discharging patient  Signed: Bonnell Public 09/01/2017, 11:46 AM

## 2017-09-01 NOTE — Progress Notes (Signed)
 DAILY PROGRESS NOTE   Patient Name: Omar Lambert Date of Encounter: 09/01/2017  Chief Complaint     Patient Profile   77 yo male with PMH of severe COPD, DM, CKD, HTN, OSA and atrial flutter who presented with worsening dyspnea and acute on chronic HF.  Subjective   Labs pending today. INR up to 1.97. Echo showed LVEF 50-55%, moderate LVH, severe TR, RVSP 47 mmHg. Switched to oral torsemide starting today. Weight up 2 lbs.  Objective   Vitals:   08/31/17 1153 08/31/17 2006 09/01/17 0444 09/01/17 0929  BP: (!) 131/94 127/77 123/68 (!) 121/59  Pulse: 76 68 78 70  Resp: 20 16 18   Temp: (!) 97.5 F (36.4 C) 98.1 F (36.7 C) 98.4 F (36.9 C) 98 F (36.7 C)  TempSrc: Oral Oral Oral Oral  SpO2: 99% 95% 94% 96%  Weight:   223 lb 9.6 oz (101.4 kg)   Height:        Intake/Output Summary (Last 24 hours) at 09/01/2017 1011 Last data filed at 09/01/2017 0935 Gross per 24 hour  Intake 843 ml  Output 925 ml  Net -82 ml   Filed Weights   08/30/17 0357 08/31/17 0524 09/01/17 0444  Weight: 221 lb (100.2 kg) 221 lb 9.6 oz (100.5 kg) 223 lb 9.6 oz (101.4 kg)    Physical Exam   General appearance: alert and no distress Lungs: clear to auscultation bilaterally Heart: irregularly irregular rhythm Extremities: extremities normal, atraumatic, no cyanosis or edema Neurologic: Grossly normal  Inpatient Medications    Scheduled Meds: . allopurinol  300 mg Oral Daily  . calcitRIOL  0.25 mcg Oral Daily  . isosorbide mononitrate  30 mg Oral Daily  . levothyroxine  175 mcg Oral QAC breakfast  . potassium chloride SA  20 mEq Oral Daily  . rosuvastatin  20 mg Oral Daily  . sodium chloride flush  3 mL Intravenous Q12H  . torsemide  20 mg Oral BID  . warfarin  5 mg Oral ONCE-1800  . Warfarin - Pharmacist Dosing Inpatient   Does not apply q1800    Continuous Infusions: . sodium chloride      PRN Meds: sodium chloride, acetaminophen, ipratropium-albuterol, ondansetron  (ZOFRAN) IV, sodium chloride flush   Labs   Results for orders placed or performed during the hospital encounter of 08/29/17 (from the past 48 hour(s))  Glucose, capillary     Status: None   Collection Time: 08/30/17 11:34 AM  Result Value Ref Range   Glucose-Capillary 77 70 - 99 mg/dL  Glucose, capillary     Status: None   Collection Time: 08/30/17  4:28 PM  Result Value Ref Range   Glucose-Capillary 82 70 - 99 mg/dL  Glucose, capillary     Status: None   Collection Time: 08/30/17  9:07 PM  Result Value Ref Range   Glucose-Capillary 79 70 - 99 mg/dL  Glucose, capillary     Status: Abnormal   Collection Time: 08/31/17  1:09 AM  Result Value Ref Range   Glucose-Capillary 111 (H) 70 - 99 mg/dL  Basic metabolic panel     Status: Abnormal   Collection Time: 08/31/17  5:08 AM  Result Value Ref Range   Sodium 142 135 - 145 mmol/L   Potassium 4.0 3.5 - 5.1 mmol/L   Chloride 96 (L) 98 - 111 mmol/L   CO2 35 (H) 22 - 32 mmol/L   Glucose, Bld 101 (H) 70 - 99 mg/dL   BUN 40 (H)   8 - 23 mg/dL   Creatinine, Ser 2.37 (H) 0.61 - 1.24 mg/dL   Calcium 9.3 8.9 - 10.3 mg/dL   GFR calc non Af Amer 25 (L) >60 mL/min   GFR calc Af Amer 29 (L) >60 mL/min    Comment: (NOTE) The eGFR has been calculated using the CKD EPI equation. This calculation has not been validated in all clinical situations. eGFR's persistently <60 mL/min signify possible Chronic Kidney Disease.    Anion gap 11 5 - 15    Comment: Performed at [facility] [address] [place], [place] [address]  CBC     Status: Abnormal   Collection Time: 08/31/17  5:08 AM  Result Value Ref Range   WBC 4.2 4.0 - 10.5 K/uL   RBC 3.57 (L) 4.22 - 5.81 MIL/uL   Hemoglobin 10.7 (L) 13.0 - 17.0 g/dL   HCT 36.6 (L) 39.0 - 52.0 %   MCV 102.5 (H) 78.0 - 100.0 fL   MCH 30.0 26.0 - 34.0 pg   MCHC 29.2 (L) 30.0 - 36.0 g/dL   RDW 18.2 (H) 11.5 - 15.5 %   Platelets 158 150 - 400 K/uL    Comment: Performed at [facility], [place] [address] [place], [place] [address]  Protime-INR     Status: Abnormal   Collection Time: 08/31/17  5:08 AM  Result Value Ref Range   Prothrombin Time 21.4 (H) 11.4 - 15.2 seconds   INR 1.87     Comment: Performed at [facility] [address] [place], [place] [address]  Lipid panel     Status: None   Collection Time: 08/31/17  5:08 AM  Result Value Ref Range   Cholesterol 135 0 - 200 mg/dL   Triglycerides 34 <150 mg/dL   HDL 84 >40 mg/dL   Total CHOL/HDL Ratio 1.6 RATIO   VLDL 7 0 - 40 mg/dL   LDL Cholesterol 44 0 - 99 mg/dL    Comment:        Total Cholesterol/HDL:CHD Risk Coronary Heart Disease Risk Table                     Men   Women  1/2 Average Risk   3.4   3.3  Average Risk       5.0   4.4  2 X Average Risk   9.6   7.1  3 X Average Risk  23.4   11.0        Use the calculated Patient Ratio above and the CHD Risk Table to determine the patient's CHD Risk.        ATP III CLASSIFICATION (LDL):  <100     mg/dL   Optimal  100-129  mg/dL   Near or Above                    Optimal  130-159  mg/dL   Borderline  160-189  mg/dL   High  >190     mg/dL   Very High Performed at [facility] [address], [place], [place] [address]   Glucose, capillary     Status: None   Collection Time: 08/31/17  5:22 AM  Result Value Ref Range   Glucose-Capillary 92 70 - 99 mg/dL  Glucose, capillary     Status: None   Collection Time: 08/31/17  7:23 AM  Result Value Ref Range   Glucose-Capillary 87 70 - 99 mg/dL  Comment 1 Notify RN    Comment 2 Document in Chart   Glucose, capillary     Status: None   Collection Time: 08/31/17 12:03 PM  Result Value Ref Range   Glucose-Capillary 71 70 - 99 mg/dL   Comment 1 Notify RN    Comment 2 Document in Chart   Glucose, capillary     Status: None   Collection Time: 08/31/17  5:15 PM  Result Value Ref Range   Glucose-Capillary 83 70 - 99 mg/dL   Comment 1 Notify RN    Comment 2 Document in Chart   Glucose, capillary      Status: None   Collection Time: 08/31/17  8:09 PM  Result Value Ref Range   Glucose-Capillary 79 70 - 99 mg/dL  Glucose, capillary     Status: None   Collection Time: 09/01/17 12:47 AM  Result Value Ref Range   Glucose-Capillary 89 70 - 99 mg/dL  Glucose, capillary     Status: Abnormal   Collection Time: 09/01/17  4:42 AM  Result Value Ref Range   Glucose-Capillary 111 (H) 70 - 99 mg/dL  Glucose, capillary     Status: Abnormal   Collection Time: 09/01/17  7:08 AM  Result Value Ref Range   Glucose-Capillary 109 (H) 70 - 99 mg/dL   Comment 1 Notify RN    Comment 2 Document in Chart   Protime-INR     Status: Abnormal   Collection Time: 09/01/17  7:36 AM  Result Value Ref Range   Prothrombin Time 22.2 (H) 11.4 - 15.2 seconds   INR 1.97     Comment: Performed at [facility], [place] [address], [place], [place] [address]    ECG   N/A  Telemetry   Afib with CVR - Personally Reviewed  Radiology    No results found.  Cardiac Studies   LV EF: 50% -   55%  ------------------------------------------------------------------- Indications:      Dyspnea 786.09.  ------------------------------------------------------------------- History:   PMH:   Atrial flutter.  Congestive heart failure. Chronic obstructive pulmonary disease.  Risk factors:  Diabetes mellitus.  ------------------------------------------------------------------- Study Conclusions  - Left ventricle: The cavity size was normal. There was moderate   concentric hypertrophy. Systolic function was normal. The   estimated ejection fraction was in the range of 50% to 55%. Wall   motion was normal; there were no regional wall motion   abnormalities. Left ventricular diastolic function parameters   were normal. - Ventricular septum: The contour showed diastolic flattening   consistent with right ventricular volume overload. - Aortic valve: Transvalvular velocity was within the normal range.   There was no  stenosis. There was trivial regurgitation. - Right ventricle: The cavity size was mildly dilated. Wall   thickness was normal. Systolic function was normal. - Right atrium: The atrium was severely dilated. - Tricuspid valve: There was severe regurgitation. - Pulmonary arteries: Systolic pressure was mildly increased. PA   peak pressure: 47 mm Hg (S).  Assessment   Principal Problem:   Respiratory distress Active Problems:   Benign essential hypertension   Persistent atrial fibrillation (HCC)   Morbid obesity (HCC)   Hypothyroidism   Diabetes mellitus with renal complications (HCC)   Blood clotting disorder (HCC)   Acute on chronic diastolic heart failure (HCC)   Hypokalemia   Diabetic ulcer of lower leg (HCC)   CKD (chronic kidney disease), stage III (HCC)   Acute on chronic diastolic congestive heart failure (HCC)   Elevated  troponin I level   Plan   1. Breathing improved today -ambulated without difficulty yesterday. Awaiting labs today to see if Cr stabilized. Plan to restart home torsemide today. Can likely be discharged home today from a cardiac standpoint.  Time Spent Directly with Patient:  I have spent a total of 15 minutes with the patient reviewing hospital notes, telemetry, EKGs, labs and examining the patient as well as establishing an assessment and plan that was discussed personally with the patient.  > 50% of time was spent in direct patient care.  Length of Stay:  LOS: 3 days    C. , MD, FACC, FACP  [place]  [facility]  Medical Director of the [facility] Diplomate of the American Board of Clinical Lipidology Attending Cardiologist  Direct Dial: [phone]  Fax: [phone]  Website:  [email or web address]   C  09/01/2017, 10:11 AM  

## 2017-09-02 ENCOUNTER — Telehealth: Payer: Self-pay

## 2017-09-02 NOTE — Telephone Encounter (Signed)
Transition Care Management Follow-up Telephone Call  Date of discharge and from where: [facility] on 09/01/2017  How have you been since you were released from the hospital? Doing well. No problems with breathing and wore CPAP last night  Any questions or concerns? No   Items Reviewed:  Did the pt receive and understand the discharge instructions provided? Yes   Medications obtained and verified? N/A  Any new allergies since your discharge? Yes   Dietary orders reviewed? Yes  Do you have support at home? Yes , wife  Other (ie: DME, Home Health, etc) No  Functional Questionnaire: (I = Independent and D = Dependent) ADL's: I  Bathing/Dressing- I    Meal Prep- D  Eating- I  Maintaining continence- I  Transferring/Ambulation- I  Managing Meds- I   Follow up appointments reviewed:    PCP Hospital f/u appt confirmed? Yes  Scheduled to see Dr. Mariea Clonts on 10/09/2017 @ 8:30am. This was the soonest opening in our office  [facility] f/u appt confirmed? Yes  Scheduled to see Dr. Arlyss Gandy on 09/10/2017 @ 10:30.  Are transportation arrangements needed? No   If their condition worsens, is the pt aware to call  their PCP or go to the ED? Yes  Was the patient provided with contact information for the PCP's office or ED? Yes  Was the pt encouraged to call back with questions or concerns? Yes

## 2017-09-04 DIAGNOSIS — H1131 Conjunctival hemorrhage, right eye: Secondary | ICD-10-CM | POA: Diagnosis not present

## 2017-09-06 DIAGNOSIS — G4733 Obstructive sleep apnea (adult) (pediatric): Secondary | ICD-10-CM | POA: Diagnosis not present

## 2017-09-10 ENCOUNTER — Encounter: Payer: Self-pay | Admitting: Internal Medicine

## 2017-09-10 ENCOUNTER — Ambulatory Visit: Payer: Medicare HMO | Admitting: Internal Medicine

## 2017-09-10 VITALS — BP 120/72 | HR 67 | Ht 59.0 in | Wt 229.0 lb

## 2017-09-10 DIAGNOSIS — I2781 Cor pulmonale (chronic): Secondary | ICD-10-CM | POA: Diagnosis not present

## 2017-09-10 DIAGNOSIS — J9611 Chronic respiratory failure with hypoxia: Secondary | ICD-10-CM | POA: Diagnosis not present

## 2017-09-10 DIAGNOSIS — Z9989 Dependence on other enabling machines and devices: Secondary | ICD-10-CM

## 2017-09-10 DIAGNOSIS — J9612 Chronic respiratory failure with hypercapnia: Secondary | ICD-10-CM | POA: Diagnosis not present

## 2017-09-10 DIAGNOSIS — G4733 Obstructive sleep apnea (adult) (pediatric): Secondary | ICD-10-CM | POA: Diagnosis not present

## 2017-09-10 DIAGNOSIS — J449 Chronic obstructive pulmonary disease, unspecified: Secondary | ICD-10-CM

## 2017-09-10 MED ORDER — UMECLIDINIUM-VILANTEROL 62.5-25 MCG/INH IN AEPB: 1.0000 | INHALATION_SPRAY | Freq: Every day | RESPIRATORY_TRACT | 11 refills | Status: DC

## 2017-09-10 MED ORDER — UMECLIDINIUM-VILANTEROL 62.5-25 MCG/INH IN AEPB: 1.0000 | INHALATION_SPRAY | Freq: Every day | RESPIRATORY_TRACT | 0 refills | Status: DC

## 2017-09-10 NOTE — Assessment & Plan Note (Signed)
HC03   08/25/17  = 31  Whereas had been as high as 37 earlier in July 2019  09/10/2017   Walked RA x one lap @ 185 stopped due to  desats to mid 80's declined amb 02 -  ONO RA on cpap ordered 09/10/2017    Advised to walk slower for now and let the anoro start working and return for repeat amb 02 and pfts in 4 weeks   Total time devoted to counseling  > 50 % of initial 60 min office visit:  review case with pt/wife discussion of options/alternatives/ personally creating written customized instructions  in presence of pt  then going over those specific  Instructions directly with the pt including how to use all of the meds but in particular covering each new medication in detail and the difference between the maintenance= "automatic" meds and the prns using an action plan format for the latter (If this problem/symptom => do that organization reading Left to right).  Please see AVS from this visit for a full list of these instructions which I personally wrote for this pt and  are unique to this visit.   See device teaching which extended face to face time for this visit

## 2017-09-10 NOTE — Assessment & Plan Note (Signed)
See echo 08/29/17  PAS 47   rx is to treat the copd/ hypoxemia and keep optimum vol status per cards

## 2017-09-10 NOTE — Patient Instructions (Addendum)
Plan A = Automatic = ANORO one click each  Pm - take two deep smooth drags off it  Work on inhaler technique:  relax and gently blow all the way out then take a nice smooth deep breath back in,  Hold for up to 5 seconds if you can.  Rinse and gargle with water when done   Plan B = Backup Only use your albuterol as a rescue medication to be used if you can't catch your breath by resting or doing a relaxed purse lip breathing pattern.  - The less you use it, the better it will work when you need it. - Ok to use the inhaler  Up to  every 4 hours if you must but call for appointment if use goes up over your usual need  Walk at slow pace than you did today to avoid low oxygen  levels walking   Please see patient coordinator before you leave today  to schedule ono on CPAP     Please schedule a follow up office visit in  4 weeks, call sooner if needed with PFT's on return

## 2017-09-10 NOTE — Progress Notes (Signed)
Subjective:    Patient ID: Omar Lambert, male    DOB: Mar 20, 1940   MRN: [id number]   Brief patient profile:  77 yobm quit smoking 1999 at wt 200 lb with onset of breathing problems around 2005 but worse since so referred by Omar Lambert for evaluation of copd with GOLD II criteria 12/25/2012     History of Present Illness  11/24/2012 1st [facility] office visit/ Omar Lambert cc indolent onset progresssively worsening doe to point where only goes about  100 ft ? No better p saba no cough, breathing worse bending over. No better on advair hfa rec Breo one puff each am and stop all other respiratory medications Only use your albuterol   Bring all active medications to each visit separated into two bags : the ones you take automatically vs the ones you take as needed      12/25/2012 f/u ov/Omar Lambert re: copd GOLD II plus obesity effects  Chief Complaint  Patient presents with  . Follow-up    PFT done today.  Breathing has improved since last OV.  rec Breo F/u prn     Admit date: 08/29/2017 Discharge date: 09/01/2017  Discharge Diagnoses:  Principal Problem:   Acute on chronic diastolic heart failure    Benign essential hypertension   Persistent atrial fibrillation (HCC)   Morbid obesity (HCC)   Hypothyroidism   Diabetes mellitus with renal complications (HCC)   Blood clotting disorder (HCC)   respiratory distress   Hypokalemia   Diabetic ulcer of lower leg (HCC)   CKD (chronic kidney disease), stage III (HCC)   Acute on chronic diastolic congestive heart failure (HCC)   Elevated troponin I level      Hospital Course: Patient is a 77 year old African-American male, morbidly obese, with past medical history significant for COPD, asthma, atrial fibrillation, hypertension, chronic kidney disease stage III/IV, pulmonary hypertension, right ventricular failure. Patient is also documented to have diastolic heart failure. Patient has OSA, been noncompliant with the CPAP  machine at home. Patient was admitted with heart failure exacerbation.   Acute on chronic diastolic heart failure: Patient was adequately diuresed.  Apparently, the patient was on torsemide prior to admission.  Patient was treated with IV Lasix.  Edema is improved significantly.  Respiratory symptoms have improved significantly.  Echocardiogram revealed moderate concentric left ventricular hypertrophy, EF of 50 to 06%, diastolic flattening of the ventricular septum that is consistent with right ventricular volume overload and PA peak pressure 47 mmHg.  Cardiology team was consulted to assist with patient's management.  Cardiology team is cleared patient for discharge.  Patient will be discharged on torsemide 20 mg p.o. twice daily.  There will be need for the primary care provider and the cardiology team to continue monitoring patient's volume status, renal function and electrolytes.  Patient has also been advised to comply with CPAP.  OSA not compliant with CPAP/Right heart failure: Patient counseled regarding need to comply with CPAP at home. Follow-up with pulmonary team on discharge.  Hypokalemia: Resolved. Monitor electrolytes and replete.  Persistent atrial fib: Rate controlled.  chadvasc score 5. Optimize INR (Goal is 2-3)  Hypertension: .Controlled   Diabetes: - Low blood sugar noted on presentation - Initially on Dextrose infusion -Patient Omar Lambert will be on hold.  Patient has been ad provider.   vised to check blood sugar at least 4 times daily, document readings, and reviewed readings with the primary  Care  Elevated troponin:  Initial troponin 0.05. Repeat Troponin is down to 0.04.  Likely type II elevation  Chronic kidney disease stage III/IV: -Stable.  Obesity: Diet and exercise.  Consults: cardiology  Significant Diagnostic Studies: cardiac graphics: Echocardiogram: See above.     09/10/2017  Re-establish ov/ Omar Lambert re  GOLD II COPD on no maint  rx  Chief Complaint  Patient presents with  . Pulmonary Consult    Self referral. Recent hospital admit for heart failure and SOB. He wanted to f/u since he was last seen back in 2014. He feels like his breathing is overall doing well today.   Dyspnea much worse x one month prior to above admit with clear evidence vol overload on cxr but even with max rx as inpt doe still limiting:  MMRC3 = can't walk 100 yards even at a slow pace at a flat grade s stopping due to sob   Cough: none now Sleeping: ok cpap 2-3 pillows monitored by Omar Lambert  SABA PYP:[id number] twice daily  02: none    No obvious day to day or daytime variability or assoc excess/ purulent sputum or mucus plugs or hemoptysis or cp or chest tightness, subjective wheeze or overt sinus or hb symptoms.   Sleeping on cpap  without nocturnal  or early am exacerbation  of respiratory  c/o's or need for noct saba. Also denies any obvious fluctuation of symptoms with weather or environmental changes or other aggravating or alleviating factors except as outlined above   No unusual exposure hx or h/o childhood pna/ asthma or knowledge of premature birth.  Current Allergies, Complete Past Medical History, Past Surgical History, Family History, and Social History were reviewed in [employer] record.  ROS  The following are not active complaints unless bolded Hoarseness, sore throat, dysphagia, dental problems, itching, sneezing,  nasal congestion or discharge of excess mucus or purulent secretions, ear ache,   fever, chills, sweats, unintended wt loss or wt gain, classically pleuritic or exertional cp,  orthopnea pnd or arm/hand swelling  or leg swelling, presyncope, palpitations, abdominal pain, anorexia, nausea, vomiting, diarrhea  or change in bowel habits or change in bladder habits, change in stools or change in urine, dysuria, hematuria,  rash, arthralgias, visual complaints, headache, numbness, weakness or ataxia or  problems with walking or coordination,  change in mood or  memory.        Current Meds  Medication Sig  . acetaminophen (TYLENOL) 325 MG tablet Take 650 mg by mouth every 6 (six) hours as needed for mild pain.  Marland Kitchen albuterol (PROVENTIL) (2.5 MG/3ML) 0.083% nebulizer solution INHALE CONTENTS OF 1 VIAL IN NEBULIZER EVERY 4 HOURS AS NEEDED FOR WHEEZING AND ASTHMA  . allopurinol (ZYLOPRIM) 300 MG tablet TAKE 1 TABLET BY MOUTH EVERY DAY  . calcitRIOL (ROCALTROL) 0.25 MCG capsule Take 0.25 mcg by mouth daily.   . isosorbide mononitrate (IMDUR) 30 MG 24 hr tablet Take 1 tablet (30 mg total) by mouth daily.  Marland Kitchen levothyroxine (SYNTHROID, LEVOTHROID) 175 MCG tablet TAKE 1 TABLET EVERY DAY BEFORE BREAKFAST  . Multiple Vitamins-Minerals (CENTRUM SILVER ADULT 50+ PO) Take 1 tablet by mouth daily.  . pantoprazole (PROTONIX) 40 MG tablet TAKE 1 TABLET BY MOUTH EVERY DAY BEFORE SUPPER  . rosuvastatin (CRESTOR) 20 MG tablet Take 1 tablet (20 mg total) by mouth daily. Need OV for further refills  . torsemide (DEMADEX) 20 MG tablet TAKE 1 TABLET TWICE A DAY  . warfarin (COUMADIN) 5 MG tablet Take 1/2 to 1 tablet by mouth daily as directed by coumadin clinic (Patient taking  differently: Take 2.5-5 mg by mouth daily. )                    .        Objective:   Physical Exam  12/25/2012     252 > 09/10/2017  229     11/24/12 254 lb (115.214 kg)  11/18/12 256 lb (116.121 kg)  10/16/12 265 lb (120.203 kg)      amb obese bm feq tc  Vital signs reviewed - Note on arrival 02 sats  93% on RA    Full dentures    HEENT: nl   oropharynx. Nl external ear canals without cough reflex - mild bilateral non-specific turbinate edema  / full dentures    NECK :  without JVD/Nodes/TM/ nl carotid upstrokes bilaterally   LUNGS: no acc muscle use,  Mild barrel  contour chest wall with bilateral  Distant bs s audible wheeze and  without cough on insp or exp maneuver and mild   Hyperresonant  to  percussion  bilaterally     CV:  RRR  no s3 or murmur or increase in P2, and no edema   ABD:  soft and nontender with pos late  insp Hoover's  in the supine position. No bruits or organomegaly appreciated, bowel sounds nl  MS:   Nl gait/  ext warm without deformities, calf tenderness, cyanosis or clubbing No obvious joint restrictions   SKIN: warm and dry without lesions    NEURO:  alert, approp, nl sensorium with  no motor or cerebellar deficits apparent.          I personally reviewed images and agree with radiology impression as follows:  CXR:   08/29/17 1. Tiny pleural effusions 2. Cardiomegaly with vascular congestion 3. Mild diffuse interstitial opacity, may reflect minimal interstitial edema versus interstitial inflammation.    Assessment & Plan:

## 2017-09-10 NOTE — Assessment & Plan Note (Signed)
Need to confirm does not needs 02 with cpap so if desats on RA plus cpap will first need cpap titration.  Note assoc with prob cor pulmonale so goal is to keep sats > 90% 24/7

## 2017-09-10 NOTE — Assessment & Plan Note (Signed)
-   trial of Breo 11/25/12 > improved 12/25/2012  - PFTs 12/25/2012 FEV1  1.44 (58%) ratio 69 and no change p B2 p last Breo am of pft's  - Desats on walking noted 09/10/2017 off breo > see resp failure  - 09/10/2017  After extensive coaching inhaler device  effectiveness =  90% with Elipta  >  try anoro one click each am   Not clear how much copd is contributing to his symptoms at this point but if it is then Pt is Group B in terms of symptom/risk and laba/lama therefore appropriate rx at this point and already familiar with BREO so reasonable to try [facility] one click each am and return for full pfts to sort out

## 2017-09-13 ENCOUNTER — Encounter (HOSPITAL_BASED_OUTPATIENT_CLINIC_OR_DEPARTMENT_OTHER): Payer: Medicare HMO | Attending: Internal Medicine

## 2017-09-17 ENCOUNTER — Encounter: Payer: Self-pay | Admitting: *Deleted

## 2017-10-01 ENCOUNTER — Encounter: Payer: Self-pay | Admitting: Internal Medicine

## 2017-10-01 DIAGNOSIS — M109 Gout, unspecified: Secondary | ICD-10-CM | POA: Diagnosis not present

## 2017-10-01 DIAGNOSIS — I509 Heart failure, unspecified: Secondary | ICD-10-CM | POA: Diagnosis not present

## 2017-10-01 DIAGNOSIS — N2581 Secondary hyperparathyroidism of renal origin: Secondary | ICD-10-CM | POA: Diagnosis not present

## 2017-10-01 DIAGNOSIS — I4891 Unspecified atrial fibrillation: Secondary | ICD-10-CM | POA: Diagnosis not present

## 2017-10-01 DIAGNOSIS — E1122 Type 2 diabetes mellitus with diabetic chronic kidney disease: Secondary | ICD-10-CM | POA: Diagnosis not present

## 2017-10-01 DIAGNOSIS — E785 Hyperlipidemia, unspecified: Secondary | ICD-10-CM | POA: Diagnosis not present

## 2017-10-01 DIAGNOSIS — I129 Hypertensive chronic kidney disease with stage 1 through stage 4 chronic kidney disease, or unspecified chronic kidney disease: Secondary | ICD-10-CM | POA: Diagnosis not present

## 2017-10-01 DIAGNOSIS — N184 Chronic kidney disease, stage 4 (severe): Secondary | ICD-10-CM | POA: Diagnosis not present

## 2017-10-01 DIAGNOSIS — N189 Chronic kidney disease, unspecified: Secondary | ICD-10-CM | POA: Diagnosis not present

## 2017-10-01 DIAGNOSIS — D631 Anemia in chronic kidney disease: Secondary | ICD-10-CM | POA: Diagnosis not present

## 2017-10-08 DIAGNOSIS — K219 Gastro-esophageal reflux disease without esophagitis: Secondary | ICD-10-CM | POA: Diagnosis not present

## 2017-10-09 ENCOUNTER — Other Ambulatory Visit: Payer: Self-pay | Admitting: Cardiovascular Disease

## 2017-10-09 ENCOUNTER — Ambulatory Visit: Payer: Medicare HMO | Admitting: Internal Medicine

## 2017-10-09 NOTE — Telephone Encounter (Signed)
Pt answered phone, explained who I was and that we needed to schedule INR.  Phone disconnected, tried to call back but no answer. LMOM.

## 2017-10-10 ENCOUNTER — Ambulatory Visit (INDEPENDENT_AMBULATORY_CARE_PROVIDER_SITE_OTHER): Payer: Medicare HMO | Admitting: Internal Medicine

## 2017-10-10 ENCOUNTER — Ambulatory Visit: Payer: Medicare HMO | Admitting: Internal Medicine

## 2017-10-10 ENCOUNTER — Telehealth: Payer: Self-pay | Admitting: Internal Medicine

## 2017-10-10 ENCOUNTER — Ambulatory Visit (INDEPENDENT_AMBULATORY_CARE_PROVIDER_SITE_OTHER)
Admission: RE | Admit: 2017-10-10 | Discharge: 2017-10-10 | Disposition: A | Discharge status: Home / Self Care | Payer: Medicare HMO | Source: Ambulatory Visit | Attending: Internal Medicine | Admitting: Internal Medicine

## 2017-10-10 ENCOUNTER — Encounter: Payer: Self-pay | Admitting: Internal Medicine

## 2017-10-10 DIAGNOSIS — J90 Pleural effusion, not elsewhere classified: Secondary | ICD-10-CM | POA: Diagnosis not present

## 2017-10-10 DIAGNOSIS — J9612 Chronic respiratory failure with hypercapnia: Secondary | ICD-10-CM | POA: Diagnosis not present

## 2017-10-10 DIAGNOSIS — J449 Chronic obstructive pulmonary disease, unspecified: Secondary | ICD-10-CM

## 2017-10-10 DIAGNOSIS — R0602 Shortness of breath: Secondary | ICD-10-CM | POA: Diagnosis not present

## 2017-10-10 DIAGNOSIS — J9611 Chronic respiratory failure with hypoxia: Secondary | ICD-10-CM

## 2017-10-10 DIAGNOSIS — I2781 Cor pulmonale (chronic): Secondary | ICD-10-CM

## 2017-10-10 LAB — PULMONARY FUNCTION TEST
DL/VA % pred: 108 %
DL/VA: 4.74 ml/min/mmHg/L
DLCO COR: 15 ml/min/mmHg
DLCO UNC % PRED: 46 %
DLCO UNC: 13.04 ml/min/mmHg
DLCO cor % pred: 53 %
FEF 25-75 PRE: 0.88 L/s
FEF 25-75 Post: 0.7 L/sec
FEF2575-%CHANGE-POST: -19 %
FEF2575-%PRED-PRE: 47 %
FEF2575-%Pred-Post: 37 %
FEV1-%Change-Post: -8 %
FEV1-%PRED-POST: 38 %
FEV1-%PRED-PRE: 42 %
FEV1-POST: 0.91 L
FEV1-PRE: 1 L
FEV1FVC-%CHANGE-POST: -19 %
FEV1FVC-%Pred-Pre: 107 %
FEV6-%CHANGE-POST: 11 %
FEV6-%PRED-POST: 45 %
FEV6-%PRED-PRE: 40 %
FEV6-Post: 1.38 L
FEV6-Pre: 1.24 L
FEV6FVC-%CHANGE-POST: -1 %
FEV6FVC-%PRED-PRE: 106 %
FEV6FVC-%Pred-Post: 104 %
FVC-%Change-Post: 12 %
FVC-%Pred-Post: 43 %
FVC-%Pred-Pre: 38 %
FVC-Post: 1.4 L
FVC-Pre: 1.24 L
POST FEV6/FVC RATIO: 98 %
PRE FEV1/FVC RATIO: 81 %
Post FEV1/FVC ratio: 65 %
Pre FEV6/FVC Ratio: 100 %
RV % PRED: 190 %
RV: 4.69 L
TLC % PRED: 93 %
TLC: 6.03 L

## 2017-10-10 NOTE — Patient Instructions (Addendum)
We will arrange for  overnight oximetry on CPAP    Please remember to go to the  x-ray department downstairs in the basement  for your tests - we will call you with the results when they are available.       Please schedule a follow up visit in 3 months but call sooner if needed  with all medications /inhalers/ solutions in hand so we can verify exactly what you are taking. This includes all medications from all doctors and over the counters

## 2017-10-10 NOTE — Progress Notes (Signed)
Subjective:    Patient ID: Omar Lambert, male    DOB: 1940-03-11   MRN: [id number]   Brief patient profile:  77 yobm quit smoking 1999 at wt 200 lb with onset of breathing problems around 2005 but worse since so referred by [name] for evaluation of copd with GOLD II criteria 12/25/2012     History of Present Illness  11/24/2012 1st [facility] office visit/ Omar Lambert cc indolent onset progresssively worsening doe to point where only goes about  100 ft ? No better p saba no cough, breathing worse bending over. No better on advair hfa rec Breo one puff each am and stop all other respiratory medications Only use your albuterol   Bring all active medications to each visit separated into two bags : the ones you take automatically vs the ones you take as needed      12/25/2012 f/u ov/Omar Lambert re: copd GOLD II plus obesity effects  Chief Complaint  Patient presents with  . Follow-up    PFT done today.  Breathing has improved since last OV.  rec Breo F/u prn     Admit date: 08/29/2017 Discharge date: 09/01/2017  Discharge Diagnoses:  Principal Problem:   Acute on chronic diastolic heart failure    Benign essential hypertension   Persistent atrial fibrillation (HCC)   Morbid obesity (HCC)   Hypothyroidism   Diabetes mellitus with renal complications (HCC)   Blood clotting disorder (HCC)   respiratory distress   Hypokalemia   Diabetic ulcer of lower leg (HCC)   CKD (chronic kidney disease), stage III (HCC)   Acute on chronic diastolic congestive heart failure (HCC)   Elevated troponin I level      Hospital Course: Patient is a 77 year old African-American male, morbidly obese, with past medical history significant for COPD, asthma, atrial fibrillation, hypertension, chronic kidney disease stage III/IV, pulmonary hypertension, right ventricular failure. Patient is also documented to have diastolic heart failure. Patient has OSA, been noncompliant with the CPAP  machine at home. Patient was admitted with heart failure exacerbation.   Acute on chronic diastolic heart failure: Patient was adequately diuresed.  Apparently, the patient was on torsemide prior to admission.  Patient was treated with IV Lasix.  Edema is improved significantly.  Respiratory symptoms have improved significantly.  Echocardiogram revealed moderate concentric left ventricular hypertrophy, EF of 50 to 34%, diastolic flattening of the ventricular septum that is consistent with right ventricular volume overload and PA peak pressure 47 mmHg.  Cardiology team was consulted to assist with patient's management.  Cardiology team is cleared patient for discharge.  Patient will be discharged on torsemide 20 mg p.o. twice daily.  There will be need for the primary care provider and the cardiology team to continue monitoring patient's volume status, renal function and electrolytes.  Patient has also been advised to comply with CPAP.  OSA not compliant with CPAP/Right heart failure: Patient counseled regarding need to comply with CPAP at home. Follow-up with pulmonary team on discharge.  Hypokalemia: Resolved. Monitor electrolytes and replete.  Persistent atrial fib: Rate controlled.  chadvasc score 5. Optimize INR (Goal is 2-3)  Hypertension: .Controlled   Diabetes: - Low blood sugar noted on presentation - Initially on Dextrose infusion -Patient [name] will be on hold.  Patient has been ad provider.   vised to check blood sugar at least 4 times daily, document readings, and reviewed readings with the primary  Care  Elevated troponin:  Initial troponin 0.05. Repeat Troponin is down to 0.04.  Likely type II elevation  Chronic kidney disease stage III/IV: -Stable.  Obesity: Diet and exercise.  Consults: cardiology  Significant Diagnostic Studies: cardiac graphics: Echocardiogram: See above.     09/10/2017  Re-establish ov/ Omar Lambert re  GOLD II COPD on no maint  rx  Chief Complaint  Patient presents with  . Pulmonary Consult    Self referral. Recent hospital admit for heart failure and SOB. He wanted to f/u since he was last seen back in 2014. He feels like his breathing is overall doing well today.   Dyspnea much worse x one month prior to above admit with clear evidence vol overload on cxr but even with max rx as inpt doe still limiting:  MMRC3 = can't walk 100 yards even at a slow pace at a flat grade s stopping due to sob  Cough: none now Sleeping: ok cpap 2-3 pillows monitored by [name]  SABA [id number] twice daily  02: none  rec Plan A = Automatic = ANORO one click each  Pm - take two deep smooth drags off it Work on inhaler technique:   Plan B = Backup Only use your albuterol as a rescue medication Walk at slow pace than you did today to avoid low oxygen  levels walking  Please see patient coordinator before you leave today  to schedule ono on CPAP   Please schedule a follow up office visit in  4 weeks, call sooner if needed with PFT's on return      10/10/2017  f/u ov/Omar Lambert re:  Copd GOLD III / confused with details of care/ name of meds / did not bring  Chief Complaint  Patient presents with  . Follow-up    Breathing is some better since last visit. He rarely uses his neb.   Dyspnea:  [employer] pushes cart around = MMRC2 = can't walk a nl pace on a flat grade s sob but does fine slow and flat  Cough: none Sleeping: on cpap  SABA use: rare neb, no hfa    No obvious day to day or daytime variability or assoc excess/ purulent sputum or mucus plugs or hemoptysis or cp or chest tightness, subjective wheeze or overt sinus or hb symptoms.   Sleeping fine on cpap  without nocturnal  or early am exacerbation  of respiratory  c/o's or need for noct saba. Also denies any obvious fluctuation of symptoms with weather or environmental changes or other aggravating or alleviating factors except as outlined above   No unusual exposure hx or h/o  childhood pna/ asthma or knowledge of premature birth.  Current Allergies, Complete Past Medical History, Past Surgical History, Family History, and Social History were reviewed in [employer] record.  ROS  The following are not active complaints unless bolded Hoarseness, sore throat, dysphagia, dental problems, itching, sneezing,  nasal congestion or discharge of excess mucus or purulent secretions, ear ache,   fever, chills, sweats, unintended wt loss or wt gain, classically pleuritic or exertional cp,  orthopnea pnd or arm/hand swelling  or leg swelling, presyncope, palpitations, abdominal pain, anorexia, nausea, vomiting, diarrhea  or change in bowel habits or change in bladder habits, change in stools or change in urine, dysuria, hematuria,  rash, arthralgias, visual complaints, headache, numbness, weakness or ataxia or problems with walking or coordination,  change in mood or  memory.        Current Meds -   - NOTE:   Unable to verify as accurately reflecting what pt  takes     Medication Sig  . acetaminophen (TYLENOL) 325 MG tablet Take 650 mg by mouth every 6 (six) hours as needed for mild pain.  Marland Kitchen albuterol (PROVENTIL) (2.5 MG/3ML) 0.083% nebulizer solution INHALE CONTENTS OF 1 VIAL IN NEBULIZER EVERY 4 HOURS AS NEEDED FOR WHEEZING AND ASTHMA  . allopurinol (ZYLOPRIM) 300 MG tablet TAKE 1 TABLET BY MOUTH EVERY DAY  . calcitRIOL (ROCALTROL) 0.25 MCG capsule Take 0.25 mcg by mouth daily.   . isosorbide mononitrate (IMDUR) 30 MG 24 hr tablet Take 1 tablet (30 mg total) by mouth daily.  Marland Kitchen KLOR-CON M20 20 MEQ tablet TAKE 1 TABLET BY MOUTH EVERY DAY  . levothyroxine (SYNTHROID, LEVOTHROID) 175 MCG tablet TAKE 1 TABLET EVERY DAY BEFORE BREAKFAST  . Multiple Vitamins-Minerals (CENTRUM SILVER ADULT 50+ PO) Take 1 tablet by mouth daily.  . pantoprazole (PROTONIX) 40 MG tablet TAKE 1 TABLET BY MOUTH EVERY DAY BEFORE SUPPER  . rosuvastatin (CRESTOR) 20 MG tablet Take 1 tablet  (20 mg total) by mouth daily. Need OV for further refills  . torsemide (DEMADEX) 20 MG tablet TAKE 1 TABLET TWICE A DAY  . umeclidinium-vilanterol (ANORO ELLIPTA) 62.5-25 MCG/INH AEPB Inhale 1 puff into the lungs daily.  Marland Kitchen UNABLE TO FIND CPAP  Unknown DME- prescribed by doc in [place]  . warfarin (COUMADIN) 5 MG tablet TAKE 1/2 TO 1 TABLET BY MOUTH DAILY AS DIRECTED BY COUMADIN CLINIC              Objective:   Physical Exam  12/25/2012     252 > 09/10/2017  229 > 10/10/2017   231     11/24/12 254 lb (115.214 kg)  11/18/12 256 lb (116.121 kg)  10/16/12 265 lb (120.203 kg)     amb mildly hoarse  bm nad  Vital signs reviewed - Note on arrival 02 sats  95% on RA      HEENT: nl  oropharynx. Nl external ear canals without cough reflex -  Mild bilateral non-specific turbinate edema  / full dentures    NECK :  without JVD/Nodes/TM/ nl carotid upstrokes bilaterally   LUNGS: no acc muscle use,  Mod barrel  contour chest wall with bilateral  Distant bs s audible wheeze and  without cough on insp or exp maneuver and mod  Hyperresonant  to  percussion bilaterally except dull at R base     CV:  RRR  no s3 or murmur or increase in P2, and trace bilateral ankle edema   ABD:  Mod  obese but  nontender with pos mid insp Hoover's  in the supine position. No bruits or organomegaly appreciated, bowel sounds nl  MS:   Nl gait/  ext warm without deformities, calf tenderness, cyanosis or clubbing No obvious joint restrictions   SKIN: warm and dry without lesions    NEURO:  alert, approp, nl sensorium with  no motor or cerebellar deficits apparent.          CXR PA and Lateral:   10/10/2017 :    I personally reviewed images and agree with radiology impression as follows:   Consolidation felt to represent pneumonia lateral right base with small right pleural effusion. Lungs elsewhere clear. Stable cardiac prominence. There is aortic atherosclerosis. My impression:  This cxr is very similar to  previous study 07/29/17 and was felt to be R effusion as cleared with diuretics but has now recurred       Assessment & Plan:

## 2017-10-10 NOTE — Telephone Encounter (Signed)
Call report received from [facility], about Patient CXR. CXR showed right side pneumonia and right pleural effusion.    Will route to Dr. Melvyn Novas

## 2017-10-10 NOTE — Progress Notes (Signed)
Patient completed full PFT today. Patient had a difficult time blowing out one breath for pre spiro, that is why only two is available for review.

## 2017-10-11 ENCOUNTER — Encounter: Payer: Self-pay | Admitting: Internal Medicine

## 2017-10-11 DIAGNOSIS — J90 Pleural effusion, not elsewhere classified: Secondary | ICD-10-CM | POA: Insufficient documentation

## 2017-10-11 NOTE — Assessment & Plan Note (Addendum)
-   trial of Breo 11/25/12 > improved 12/25/2012  - PFTs 12/25/2012 FEV1  1.44 (58%) ratio 69 and no change p B2 p last Breo am of pft's  - Desats on walking noted 09/10/2017 off breo > see resp failure  - 09/10/2017  After extensive coaching inhaler device  effectiveness =  90% with Elipta  >  try anoro one click each am   PFT's  10/10/2017  FEV1 1.00 (42 % ) ratio 81  p no % improvement from saba p anoro prior to study with DLCO  46/53c % corrects to 108  % for alv volume    Pt is Group B in terms of symptom/risk and laba/lama therefore appropriate rx at this point > continue anoro   - The proper method of use, as well as anticipated side effects, of a metered-dose inhaler are discussed and demonstrated to the patient using teachback method.

## 2017-10-11 NOTE — Assessment & Plan Note (Addendum)
See echo 08/29/17  PAS 47 with nl LA  Classic Group III PAH  rx is to keep vol status down and 02 sats up/ reviewed  Re-ordered ono on CPAP

## 2017-10-11 NOTE — Telephone Encounter (Signed)
Discussed with Dr Jasmine December, agrees no real change since 07/09/17 c/w atypical pattern for effusion rather than pna so no change rx

## 2017-10-11 NOTE — Assessment & Plan Note (Signed)
HC03   08/25/17  = 31  Whereas had been as high as 37 earlier in July 2019  09/10/2017   Walked RA x one lap @ 185 stopped due to  desats to mid 80's declined amb 02 -  ONO RA on cpap 09/10/2017 > not done so repeated 10/10/2017   Based on concerns re chf/ cor pulmonale need to be sure cpap has corrected any noct desats

## 2017-10-11 NOTE — Assessment & Plan Note (Signed)
This is a chronic recurrent problem with present cxr identical to the one 07/09/17 and not suggestive of pna at all clinically so rec continue diuretic rx and planned f/u  - can do US guided t centesis if symptoms or cxr worsen.   I had an extended discussion with the patient reviewing all relevant studies completed to date and  lasting 15 to 20 minutes of a 25 minute summary office visit  With f/u  In 3 months planned with all meds in hand using a trust but verify approach to confirm accurate Medication  Reconciliation The principal here is that until we are certain that the  patients are doing what we've asked, it makes no sense to ask them to do more.   See device teaching which extended face to face time for this visit   Each maintenance medication was reviewed in detail including most importantly the difference between maintenance and prns and under what circumstances the prns are to be triggered using an action plan format that is not reflected in the computer generated alphabetically organized AVS.     Please see AVS for specific instructions unique to this visit that I personally wrote and verbalized to the the pt in detail and then reviewed with pt  by my nurse highlighting any  changes in therapy recommended at today's visit to their plan of care.

## 2017-10-14 ENCOUNTER — Ambulatory Visit (INDEPENDENT_AMBULATORY_CARE_PROVIDER_SITE_OTHER): Payer: Medicare HMO | Admitting: Pharmacist Clinician (PhC)/ Clinical Pharmacy Specialist

## 2017-10-14 ENCOUNTER — Other Ambulatory Visit: Payer: Self-pay | Admitting: Cardiovascular Disease

## 2017-10-14 DIAGNOSIS — I481 Persistent atrial fibrillation: Secondary | ICD-10-CM | POA: Diagnosis not present

## 2017-10-14 DIAGNOSIS — I4891 Unspecified atrial fibrillation: Secondary | ICD-10-CM

## 2017-10-14 DIAGNOSIS — Z7901 Long term (current) use of anticoagulants: Secondary | ICD-10-CM

## 2017-10-14 DIAGNOSIS — I4819 Other persistent atrial fibrillation: Secondary | ICD-10-CM

## 2017-10-14 LAB — POCT INR: INR: 2.4 (ref 2.0–3.0)

## 2017-10-14 NOTE — Patient Instructions (Signed)
Description   Take 1/2 tablet daily except for 1 tablet everry Wednesday and Friday.  Repeat INR in 4 weeks. *CALL clinic at [phone] if new medication started*

## 2017-10-17 DIAGNOSIS — E876 Hypokalemia: Secondary | ICD-10-CM | POA: Diagnosis not present

## 2017-10-18 ENCOUNTER — Encounter: Payer: Self-pay | Admitting: *Deleted

## 2017-10-21 ENCOUNTER — Other Ambulatory Visit (HOSPITAL_COMMUNITY): Payer: Self-pay

## 2017-10-22 ENCOUNTER — Other Ambulatory Visit: Payer: Self-pay | Admitting: Internal Medicine

## 2017-10-22 ENCOUNTER — Ambulatory Visit (HOSPITAL_COMMUNITY)
Admission: RE | Admit: 2017-10-22 | Discharge: 2017-10-22 | Disposition: A | Discharge status: Home / Self Care | Payer: Medicare HMO | Source: Ambulatory Visit | Attending: Nephrology | Admitting: Nephrology

## 2017-10-22 DIAGNOSIS — D631 Anemia in chronic kidney disease: Secondary | ICD-10-CM | POA: Diagnosis not present

## 2017-10-22 DIAGNOSIS — N183 Chronic kidney disease, stage 3 (moderate): Secondary | ICD-10-CM | POA: Insufficient documentation

## 2017-10-22 MED ORDER — SODIUM CHLORIDE 0.9 % IV SOLN
510.0000 mg | INTRAVENOUS | Status: DC
  Administered 2017-10-22: 510 mg via INTRAVENOUS
  Filled 2017-10-22: qty 17

## 2017-10-22 NOTE — Discharge Instructions (Signed)

## 2017-10-24 DIAGNOSIS — I129 Hypertensive chronic kidney disease with stage 1 through stage 4 chronic kidney disease, or unspecified chronic kidney disease: Secondary | ICD-10-CM | POA: Diagnosis not present

## 2017-10-28 ENCOUNTER — Encounter: Payer: Self-pay | Admitting: *Deleted

## 2017-10-29 ENCOUNTER — Encounter (HOSPITAL_COMMUNITY): Payer: Medicare HMO

## 2017-10-29 ENCOUNTER — Ambulatory Visit: Payer: Medicare HMO | Admitting: Internal Medicine

## 2017-10-29 ENCOUNTER — Telehealth: Payer: Self-pay | Admitting: *Deleted

## 2017-10-29 ENCOUNTER — Encounter: Payer: Self-pay | Admitting: Internal Medicine

## 2017-10-29 VITALS — BP 132/60 | HR 72 | Ht 67.0 in | Wt 233.0 lb

## 2017-10-29 DIAGNOSIS — K2940 Chronic atrophic gastritis without bleeding: Secondary | ICD-10-CM | POA: Diagnosis not present

## 2017-10-29 DIAGNOSIS — Z1211 Encounter for screening for malignant neoplasm of colon: Secondary | ICD-10-CM | POA: Diagnosis not present

## 2017-10-29 DIAGNOSIS — D509 Iron deficiency anemia, unspecified: Secondary | ICD-10-CM | POA: Diagnosis not present

## 2017-10-29 NOTE — Telephone Encounter (Signed)
Request for surgical clearance:     Endoscopy Procedure  What type of surgery is being performed?     Endoscopy/colonoscopy  When is this surgery scheduled?     12/19/17  What type of clearance is required ?   Pharmacy  Are there any medications that need to be held prior to surgery and how long? Coumadin, 5 days  Practice name and name of physician performing surgery?      [facility]  What is your office phone and fax number?      Phone- [phone]  [phone]  Anesthesia type (None, local, MAC, general) ?       MAC

## 2017-10-29 NOTE — Patient Instructions (Signed)
You have been scheduled for an endoscopy and colonoscopy. Please follow the written instructions given to you at your visit today. Please pick up your prep supplies at the pharmacy within the next 1-3 days. If you use inhalers (even only as needed), please bring them with you on the day of your procedure. Your physician has requested that you go to www.startemmi.com and enter the access code given to you at your visit today. This web site gives a general overview about your procedure. However, you should still follow specific instructions given to you by our office regarding your preparation for the procedure.  You will be contaced by our office prior to your procedure for directions on holding your Coumadin/Warfarin.  If you do not hear from our office 1 week prior to your scheduled procedure, please call [phone] to discuss.  If you are age 71 or older, your body mass index should be between 23-30. Your Body mass index is 36.49 kg/m. If this is out of the aforementioned range listed, please consider follow up with your Primary Care Provider.  If you are age 67 or younger, your body mass index should be between 19-25. Your Body mass index is 36.49 kg/m. If this is out of the aformentioned range listed, please consider follow up with your Primary Care Provider.

## 2017-10-29 NOTE — Progress Notes (Signed)
Subjective:    Patient ID: Omar Lambert, male    DOB: 1940-08-04, 77 y.o.   MRN: [id number]  HPI Omar Lambert is a 77 year old male with a past medical history of atrophic gastritis with intestinal metaplasia, GERD, stage IV kidney disease, atrial fibrillation on warfarin, history of CHF with last EF 50 to 55%, COPD not requiring oxygen, diabetes, hypertension, hypothyroidism, gout who is seen to evaluate iron deficiency anemia.  He is here today with his wife.  The patient sees Dr. Jimmy Footman with nephrology and has had an anemia which was felt initially to be secondary to chronic kidney disease.  Hemoglobin was in the 10-11 range.  Iron studies were performed in May and revealed a low iron saturation at 8%, low serum iron 23, TIBC was in the normal range at 274 and serum ferritin was 162.  He has received Feraheme last week and is scheduled for his second dose in 2 days.  He has not seen any blood in his stool or melena.  Occasionally when he strains he will have minor nosebleeding which is usually self-limited.  He reports a good appetite without nausea or vomiting.  No dysphagia or odynophagia.  No early satiety.  No change in bowel habits including no diarrhea or constipation.  He reports of remote colonoscopy about 10 years ago with out a history of polyps.  I performed an upper endoscopy in August 2016 which showed normal esophagus, moderate to severe atrophic gastritis and topographic mapping biopsies were obtained.  Pathology results showed mild chronic inactive atrophic gastritis and intestinal metaplasia.  No dysplasia.  He was empirically treated for H. pylori in 2015 with Prevpac given the decreased sensitivity of finding H. pylori and atrophic gastritis.   Review of Systems As per HPI, otherwise negative  Current Medications, Allergies, Past Medical History, Past Surgical History, Family History and Social History were reviewed in [employer] record.        Objective:   Physical Exam BP 132/60   Pulse 72   Ht 5\' 7"  (1.702 m)   Wt 233 lb (105.7 kg)   BMI 36.49 kg/m  Constitutional: Well-developed and well-nourished. No distress. HEENT: Normocephalic and atraumatic. Conjunctivae are normal.  No scleral icterus. Neck: Neck supple. Trachea midline. Cardiovascular: Irregularly irregular Pulmonary/chest: Effort normal and breath sounds normal. No wheezing, rales or rhonchi. Abdominal: Soft, nontender, nondistended. Bowel sounds active throughout.  Extremities: no clubbing, cyanosis, or edema Neurological: Alert and oriented to person place and time. Skin: Skin is warm and dry. Psychiatric: Normal mood and affect. Behavior is normal.  CBC    Component Value Date/Time   WBC 4.2 08/31/2017 0508   RBC 3.57 (L) 08/31/2017 0508   HGB 10.7 (L) 08/31/2017 0508   HGB 10.4 (L) 02/18/2017 0944   HCT 36.6 (L) 08/31/2017 0508   HCT 32.5 (L) 02/18/2017 0944   PLT 158 08/31/2017 0508   PLT 219 02/18/2017 0944   MCV 102.5 (H) 08/31/2017 0508   MCV 96 02/18/2017 0944   MCH 30.0 08/31/2017 0508   MCHC 29.2 (L) 08/31/2017 0508   RDW 18.2 (H) 08/31/2017 0508   RDW 16.4 (H) 02/18/2017 0944   LYMPHSABS 561 (L) 07/09/2017 1345   LYMPHSABS 0.8 02/18/2017 0944   MONOABS 1.2 (H) 11/03/2016 0512   EOSABS 41 07/09/2017 1345   EOSABS 0.3 02/18/2017 0944   BASOSABS 20 07/09/2017 1345   BASOSABS 0.0 02/18/2017 0944   Iron/TIBC/Ferritin/ %Sat    Component Value Date/Time  IRON 33 (L) 11/05/2016 1225   TIBC 305 11/05/2016 1225   FERRITIN 62.5 11/18/2012 1459   IRONPCTSAT 11 (L) 11/05/2016 1225         Assessment & Plan:  77 year old male with a past medical history of atrophic gastritis with intestinal metaplasia, GERD, stage IV kidney disease, atrial fibrillation on warfarin, history of CHF with last EF 50 to 55%, COPD not requiring oxygen, diabetes, hypertension, hypothyroidism, gout who is seen to evaluate iron deficiency anemia.  1.  Anemia,  mixed picture/question iron deficiency --iron studies were mixed with iron and percent iron saturation low but ferritin within normal limits as well as TIBC normal.  No overt bleeding or evidence of GI blood loss.  He is due for screening colonoscopy given his last exam was 10 years ago.  I also recommend colonoscopy at this time.  Given his history of atrophic gastritis with metaplasia and mixed picture anemia I feel that repeating upper endoscopy at the same time as his colonoscopy is very reasonable.  We discussed the risk, benefits and alternatives and he is agreeable and wishes to proceed  He will complete IV iron therapy IV and should have follow-up iron stores about 8 weeks after his second dose of Feraheme.  Defer anemia management to primary care and nephrology, with more information coming from upper endoscopy and colonoscopy  We will need to hold his warfarin under the direction of his managing cardiologist 5 days before Endo and colon.  Will hold warfarin 5 days prior to endoscopic procedures - will instruct when and how to resume after procedure. Benefits and risks of procedure explained including risks of bleeding, perforation, infection, missed lesions, reactions to medications and possible need for hospitalization and surgery for complications. Additional rare but real risk of stroke or other vascular clotting events off warfarin also explained and need to seek urgent help if any signs of these problems occur. Will communicate by phone or EMR with patient's  prescribing provider to confirm that holding warfarin is reasonable in this case.

## 2017-10-30 ENCOUNTER — Other Ambulatory Visit (HOSPITAL_COMMUNITY): Payer: Self-pay | Admitting: *Deleted

## 2017-10-30 MED ORDER — SUPREP BOWEL PREP KIT 17.5-3.13-1.6 GM/177ML PO SOLN: 1.0000 | ORAL | 0 refills | Status: DC

## 2017-10-30 NOTE — Telephone Encounter (Signed)
Please address anticoagulation for endo/colonscopy thanks

## 2017-10-30 NOTE — Addendum Note (Signed)
Addended by: Larina Bras on: 10/30/2017 10:53 AM   Modules accepted: Orders

## 2017-10-31 ENCOUNTER — Ambulatory Visit (HOSPITAL_COMMUNITY)
Admission: RE | Admit: 2017-10-31 | Discharge: 2017-10-31 | Disposition: A | Discharge status: Home / Self Care | Payer: Medicare HMO | Source: Ambulatory Visit | Attending: Nephrology | Admitting: Nephrology

## 2017-10-31 DIAGNOSIS — N184 Chronic kidney disease, stage 4 (severe): Secondary | ICD-10-CM | POA: Diagnosis not present

## 2017-10-31 DIAGNOSIS — D631 Anemia in chronic kidney disease: Secondary | ICD-10-CM | POA: Insufficient documentation

## 2017-10-31 MED ORDER — SODIUM CHLORIDE 0.9 % IV SOLN
510.0000 mg | INTRAVENOUS | Status: AC
  Administered 2017-10-31: 510 mg via INTRAVENOUS
  Filled 2017-10-31: qty 17

## 2017-10-31 NOTE — Telephone Encounter (Signed)
Pt takes warfarin for afib with CHADS2VASc score of 5 (age x2, HTN, CHF, DM). Ok to hold warfarin for 5 days prior to procedure.

## 2017-11-01 NOTE — Telephone Encounter (Signed)
please note pt has significant COPD as well as CAD.

## 2017-11-01 NOTE — Telephone Encounter (Signed)
Dr Hilarie Fredrickson, please see recent notes from both [place], [place] and Cecilie Kicks, NP and advise... Still ok for procedure in [place], correct?

## 2017-11-02 ENCOUNTER — Other Ambulatory Visit: Payer: Self-pay | Admitting: Cardiovascular Disease

## 2017-11-02 ENCOUNTER — Other Ambulatory Visit: Payer: Self-pay | Admitting: Internal Medicine

## 2017-11-02 DIAGNOSIS — M1A9XX0 Chronic gout, unspecified, without tophus (tophi): Secondary | ICD-10-CM

## 2017-11-05 NOTE — Telephone Encounter (Signed)
While he technically meets criteria for [facility]; on review of Dr. Gustavus Bryant note written recently I recommend we move the procedures to the outpatient hospital setting, next available

## 2017-11-07 ENCOUNTER — Other Ambulatory Visit: Payer: Self-pay | Admitting: Internal Medicine

## 2017-11-07 DIAGNOSIS — Z1211 Encounter for screening for malignant neoplasm of colon: Secondary | ICD-10-CM

## 2017-11-07 DIAGNOSIS — D509 Iron deficiency anemia, unspecified: Secondary | ICD-10-CM

## 2017-11-07 DIAGNOSIS — K2940 Chronic atrophic gastritis without bleeding: Secondary | ICD-10-CM

## 2017-11-07 NOTE — Telephone Encounter (Signed)
Patient has been rescheduled to [facility] on 12/30/17 at 10:00 am for endoscopy/colonoscopy with propofol. His [place] appointment has been cancelleed. Orders have been placed in Epic for hospital and new written hospital prep instructions using Suprep have been created for patient. I have contacted patient and have advised him that due to his significant COPD and CAD, he needs to be rescheduled to hospital setting for his procedures. He verbalizes understanding and is agreeable to this. I have sent updated written instructions for hospital prep to his home address and have also advised patient that per cardiology, he may hold his coumadin 5 days prior to his upcoming procedure. He also verbalizes understanding of this.

## 2017-11-11 ENCOUNTER — Ambulatory Visit (INDEPENDENT_AMBULATORY_CARE_PROVIDER_SITE_OTHER): Payer: Medicare HMO | Admitting: Pharmacist

## 2017-11-11 DIAGNOSIS — I4891 Unspecified atrial fibrillation: Secondary | ICD-10-CM

## 2017-11-11 DIAGNOSIS — Z7901 Long term (current) use of anticoagulants: Secondary | ICD-10-CM

## 2017-11-11 LAB — POCT INR: INR: 1.9 — AB (ref 2.0–3.0)

## 2017-11-12 ENCOUNTER — Other Ambulatory Visit: Payer: Self-pay | Admitting: Cardiovascular Disease

## 2017-11-12 ENCOUNTER — Other Ambulatory Visit: Payer: Self-pay | Admitting: Internal Medicine

## 2017-11-12 DIAGNOSIS — E785 Hyperlipidemia, unspecified: Principal | ICD-10-CM

## 2017-11-12 DIAGNOSIS — E1169 Type 2 diabetes mellitus with other specified complication: Secondary | ICD-10-CM

## 2017-11-15 ENCOUNTER — Other Ambulatory Visit: Payer: Self-pay | Admitting: Internal Medicine

## 2017-11-23 ENCOUNTER — Other Ambulatory Visit: Payer: Self-pay | Admitting: Internal Medicine

## 2017-11-23 DIAGNOSIS — J918 Pleural effusion in other conditions classified elsewhere: Secondary | ICD-10-CM | POA: Diagnosis not present

## 2017-11-23 DIAGNOSIS — R0602 Shortness of breath: Secondary | ICD-10-CM | POA: Diagnosis not present

## 2017-11-23 DIAGNOSIS — J441 Chronic obstructive pulmonary disease with (acute) exacerbation: Secondary | ICD-10-CM | POA: Diagnosis not present

## 2017-11-24 ENCOUNTER — Emergency Department (HOSPITAL_COMMUNITY): Payer: Medicare HMO

## 2017-11-24 ENCOUNTER — Other Ambulatory Visit: Payer: Self-pay

## 2017-11-24 ENCOUNTER — Inpatient Hospital Stay (HOSPITAL_COMMUNITY)
Admission: EM | Admit: 2017-11-24 | Discharge: 2017-12-06 | DRG: 291 | Disposition: A | Discharge status: Home / Self Care | Payer: Medicare HMO | Attending: Internal Medicine | Admitting: Internal Medicine

## 2017-11-24 ENCOUNTER — Encounter (HOSPITAL_COMMUNITY): Payer: Self-pay | Admitting: Emergency Medicine

## 2017-11-24 DIAGNOSIS — E1122 Type 2 diabetes mellitus with diabetic chronic kidney disease: Secondary | ICD-10-CM | POA: Diagnosis present

## 2017-11-24 DIAGNOSIS — J181 Lobar pneumonia, unspecified organism: Secondary | ICD-10-CM

## 2017-11-24 DIAGNOSIS — R7989 Other specified abnormal findings of blood chemistry: Secondary | ICD-10-CM | POA: Diagnosis present

## 2017-11-24 DIAGNOSIS — J90 Pleural effusion, not elsewhere classified: Secondary | ICD-10-CM | POA: Diagnosis present

## 2017-11-24 DIAGNOSIS — Z833 Family history of diabetes mellitus: Secondary | ICD-10-CM

## 2017-11-24 DIAGNOSIS — J9612 Chronic respiratory failure with hypercapnia: Secondary | ICD-10-CM | POA: Diagnosis not present

## 2017-11-24 DIAGNOSIS — L8990 Pressure ulcer of unspecified site, unspecified stage: Secondary | ICD-10-CM

## 2017-11-24 DIAGNOSIS — I130 Hypertensive heart and chronic kidney disease with heart failure and stage 1 through stage 4 chronic kidney disease, or unspecified chronic kidney disease: Secondary | ICD-10-CM | POA: Diagnosis not present

## 2017-11-24 DIAGNOSIS — J189 Pneumonia, unspecified organism: Secondary | ICD-10-CM | POA: Diagnosis not present

## 2017-11-24 DIAGNOSIS — J9611 Chronic respiratory failure with hypoxia: Secondary | ICD-10-CM | POA: Diagnosis not present

## 2017-11-24 DIAGNOSIS — J948 Other specified pleural conditions: Secondary | ICD-10-CM | POA: Diagnosis not present

## 2017-11-24 DIAGNOSIS — I2781 Cor pulmonale (chronic): Secondary | ICD-10-CM | POA: Diagnosis not present

## 2017-11-24 DIAGNOSIS — Z79899 Other long term (current) drug therapy: Secondary | ICD-10-CM

## 2017-11-24 DIAGNOSIS — N179 Acute kidney failure, unspecified: Secondary | ICD-10-CM | POA: Diagnosis not present

## 2017-11-24 DIAGNOSIS — R4182 Altered mental status, unspecified: Secondary | ICD-10-CM | POA: Diagnosis not present

## 2017-11-24 DIAGNOSIS — J440 Chronic obstructive pulmonary disease with acute lower respiratory infection: Secondary | ICD-10-CM | POA: Diagnosis present

## 2017-11-24 DIAGNOSIS — Z79890 Hormone replacement therapy (postmenopausal): Secondary | ICD-10-CM

## 2017-11-24 DIAGNOSIS — K2940 Chronic atrophic gastritis without bleeding: Secondary | ICD-10-CM

## 2017-11-24 DIAGNOSIS — N184 Chronic kidney disease, stage 4 (severe): Secondary | ICD-10-CM | POA: Diagnosis not present

## 2017-11-24 DIAGNOSIS — Z7901 Long term (current) use of anticoagulants: Secondary | ICD-10-CM

## 2017-11-24 DIAGNOSIS — I5033 Acute on chronic diastolic (congestive) heart failure: Secondary | ICD-10-CM | POA: Diagnosis not present

## 2017-11-24 DIAGNOSIS — I700 Atherosclerosis of aorta: Secondary | ICD-10-CM | POA: Diagnosis present

## 2017-11-24 DIAGNOSIS — G9341 Metabolic encephalopathy: Secondary | ICD-10-CM | POA: Diagnosis not present

## 2017-11-24 DIAGNOSIS — E039 Hypothyroidism, unspecified: Secondary | ICD-10-CM | POA: Diagnosis present

## 2017-11-24 DIAGNOSIS — G9340 Encephalopathy, unspecified: Secondary | ICD-10-CM | POA: Diagnosis not present

## 2017-11-24 DIAGNOSIS — E872 Acidosis: Secondary | ICD-10-CM | POA: Diagnosis present

## 2017-11-24 DIAGNOSIS — E1165 Type 2 diabetes mellitus with hyperglycemia: Secondary | ICD-10-CM | POA: Diagnosis present

## 2017-11-24 DIAGNOSIS — M109 Gout, unspecified: Secondary | ICD-10-CM | POA: Diagnosis present

## 2017-11-24 DIAGNOSIS — R109 Unspecified abdominal pain: Secondary | ICD-10-CM | POA: Diagnosis not present

## 2017-11-24 DIAGNOSIS — G4733 Obstructive sleep apnea (adult) (pediatric): Secondary | ICD-10-CM | POA: Diagnosis not present

## 2017-11-24 DIAGNOSIS — Y95 Nosocomial condition: Secondary | ICD-10-CM | POA: Diagnosis present

## 2017-11-24 DIAGNOSIS — Z9889 Other specified postprocedural states: Secondary | ICD-10-CM

## 2017-11-24 DIAGNOSIS — R0602 Shortness of breath: Secondary | ICD-10-CM

## 2017-11-24 DIAGNOSIS — J9621 Acute and chronic respiratory failure with hypoxia: Secondary | ICD-10-CM | POA: Diagnosis not present

## 2017-11-24 DIAGNOSIS — I480 Paroxysmal atrial fibrillation: Secondary | ICD-10-CM | POA: Diagnosis present

## 2017-11-24 DIAGNOSIS — I509 Heart failure, unspecified: Secondary | ICD-10-CM | POA: Diagnosis not present

## 2017-11-24 DIAGNOSIS — J9622 Acute and chronic respiratory failure with hypercapnia: Secondary | ICD-10-CM | POA: Diagnosis not present

## 2017-11-24 DIAGNOSIS — R778 Other specified abnormalities of plasma proteins: Secondary | ICD-10-CM | POA: Diagnosis present

## 2017-11-24 DIAGNOSIS — Z9842 Cataract extraction status, left eye: Secondary | ICD-10-CM

## 2017-11-24 DIAGNOSIS — Z825 Family history of asthma and other chronic lower respiratory diseases: Secondary | ICD-10-CM

## 2017-11-24 DIAGNOSIS — E876 Hypokalemia: Secondary | ICD-10-CM | POA: Diagnosis present

## 2017-11-24 DIAGNOSIS — D649 Anemia, unspecified: Secondary | ICD-10-CM

## 2017-11-24 DIAGNOSIS — I2729 Other secondary pulmonary hypertension: Secondary | ICD-10-CM | POA: Diagnosis present

## 2017-11-24 DIAGNOSIS — J9690 Respiratory failure, unspecified, unspecified whether with hypoxia or hypercapnia: Secondary | ICD-10-CM

## 2017-11-24 DIAGNOSIS — D509 Iron deficiency anemia, unspecified: Secondary | ICD-10-CM

## 2017-11-24 DIAGNOSIS — J918 Pleural effusion in other conditions classified elsewhere: Secondary | ICD-10-CM | POA: Diagnosis present

## 2017-11-24 DIAGNOSIS — Z1211 Encounter for screening for malignant neoplasm of colon: Secondary | ICD-10-CM

## 2017-11-24 DIAGNOSIS — Z6837 Body mass index (BMI) 37.0-37.9, adult: Secondary | ICD-10-CM

## 2017-11-24 DIAGNOSIS — J441 Chronic obstructive pulmonary disease with (acute) exacerbation: Secondary | ICD-10-CM | POA: Diagnosis not present

## 2017-11-24 DIAGNOSIS — J449 Chronic obstructive pulmonary disease, unspecified: Secondary | ICD-10-CM | POA: Diagnosis not present

## 2017-11-24 DIAGNOSIS — D631 Anemia in chronic kidney disease: Secondary | ICD-10-CM | POA: Diagnosis present

## 2017-11-24 DIAGNOSIS — N183 Chronic kidney disease, stage 3 (moderate): Secondary | ICD-10-CM | POA: Diagnosis not present

## 2017-11-24 DIAGNOSIS — E662 Morbid (severe) obesity with alveolar hypoventilation: Secondary | ICD-10-CM | POA: Diagnosis present

## 2017-11-24 DIAGNOSIS — I872 Venous insufficiency (chronic) (peripheral): Secondary | ICD-10-CM | POA: Diagnosis present

## 2017-11-24 DIAGNOSIS — N1832 Chronic kidney disease, stage 3b: Secondary | ICD-10-CM | POA: Diagnosis present

## 2017-11-24 DIAGNOSIS — E785 Hyperlipidemia, unspecified: Secondary | ICD-10-CM | POA: Diagnosis present

## 2017-11-24 DIAGNOSIS — K219 Gastro-esophageal reflux disease without esophagitis: Secondary | ICD-10-CM | POA: Diagnosis present

## 2017-11-24 DIAGNOSIS — I248 Other forms of acute ischemic heart disease: Secondary | ICD-10-CM | POA: Diagnosis present

## 2017-11-24 DIAGNOSIS — Z87891 Personal history of nicotine dependence: Secondary | ICD-10-CM

## 2017-11-24 DIAGNOSIS — K567 Ileus, unspecified: Secondary | ICD-10-CM | POA: Diagnosis not present

## 2017-11-24 DIAGNOSIS — E875 Hyperkalemia: Secondary | ICD-10-CM | POA: Diagnosis not present

## 2017-11-24 DIAGNOSIS — E1129 Type 2 diabetes mellitus with other diabetic kidney complication: Secondary | ICD-10-CM | POA: Diagnosis present

## 2017-11-24 DIAGNOSIS — Z9841 Cataract extraction status, right eye: Secondary | ICD-10-CM

## 2017-11-24 DIAGNOSIS — Z9119 Patient's noncompliance with other medical treatment and regimen: Secondary | ICD-10-CM

## 2017-11-24 LAB — GLUCOSE, CAPILLARY
Glucose-Capillary: 77 mg/dL (ref 70–99)
Glucose-Capillary: 85 mg/dL (ref 70–99)
Glucose-Capillary: 93 mg/dL (ref 70–99)
Glucose-Capillary: 82 mg/dL (ref 70–99)

## 2017-11-24 LAB — BRAIN NATRIURETIC PEPTIDE: B Natriuretic Peptide: 538.4 pg/mL — ABNORMAL HIGH (ref 0.0–100.0)

## 2017-11-24 LAB — INFLUENZA PANEL BY PCR (TYPE A & B)
INFLBPCR: NEGATIVE
Influenza A By PCR: NEGATIVE

## 2017-11-24 LAB — BASIC METABOLIC PANEL
Anion gap: 11 (ref 5–15)
BUN: 32 mg/dL — AB (ref 8–23)
CALCIUM: 9.4 mg/dL (ref 8.9–10.3)
CO2: 35 mmol/L — AB (ref 22–32)
CREATININE: 2.46 mg/dL — AB (ref 0.61–1.24)
Chloride: 98 mmol/L (ref 98–111)
GFR calc Af Amer: 27 mL/min — ABNORMAL LOW (ref >60)
GFR calc non Af Amer: 24 mL/min — ABNORMAL LOW (ref >60)
Glucose, Bld: 131 mg/dL — ABNORMAL HIGH (ref 70–99)
Potassium: 3.2 mmol/L — ABNORMAL LOW (ref 3.5–5.1)
Sodium: 144 mmol/L (ref 135–145)

## 2017-11-24 LAB — HIV ANTIBODY (ROUTINE TESTING W REFLEX): HIV Screen 4th Generation wRfx: NONREACTIVE

## 2017-11-24 LAB — I-STAT ARTERIAL BLOOD GAS, ED
ACID-BASE EXCESS: 14 mmol/L — AB (ref 0.0–2.0)
BICARBONATE: 42.3 mmol/L — AB (ref 20.0–28.0)
O2 Saturation: 97 %
PCO2 ART: 78 mmHg — AB (ref 32.0–48.0)
PO2 ART: 105 mmHg (ref 83.0–108.0)
Patient temperature: 100.1
TCO2: 45 mmol/L — AB (ref 22–32)
pH, Arterial: 7.346 — ABNORMAL LOW (ref 7.350–7.450)

## 2017-11-24 LAB — RESPIRATORY PANEL BY PCR
ADENOVIRUS-RVPPCR: NOT DETECTED
Bordetella pertussis: NOT DETECTED
CHLAMYDOPHILA PNEUMONIAE-RVPPCR: NOT DETECTED
CORONAVIRUS 229E-RVPPCR: NOT DETECTED
CORONAVIRUS NL63-RVPPCR: NOT DETECTED
Coronavirus HKU1: NOT DETECTED
Coronavirus OC43: NOT DETECTED
INFLUENZA A-RVPPCR: NOT DETECTED
INFLUENZA B-RVPPCR: NOT DETECTED
MYCOPLASMA PNEUMONIAE-RVPPCR: NOT DETECTED
Metapneumovirus: NOT DETECTED
PARAINFLUENZA VIRUS 4-RVPPCR: NOT DETECTED
Parainfluenza Virus 1: NOT DETECTED
Parainfluenza Virus 2: NOT DETECTED
Parainfluenza Virus 3: NOT DETECTED
Respiratory Syncytial Virus: NOT DETECTED
Rhinovirus / Enterovirus: NOT DETECTED

## 2017-11-24 LAB — CBC
HCT: 39 % (ref 39.0–52.0)
Hemoglobin: 11.3 g/dL — ABNORMAL LOW (ref 13.0–17.0)
MCH: 31 pg (ref 26.0–34.0)
MCHC: 29 g/dL — AB (ref 30.0–36.0)
MCV: 107.1 fL — AB (ref 80.0–100.0)
PLATELETS: 157 10*3/uL (ref 150–400)
RBC: 3.64 MIL/uL — ABNORMAL LOW (ref 4.22–5.81)
RDW: 19.2 % — AB (ref 11.5–15.5)
WBC: 3.9 10*3/uL — ABNORMAL LOW (ref 4.0–10.5)
nRBC: 0 % (ref 0.0–0.2)

## 2017-11-24 LAB — CK TOTAL AND CKMB (NOT AT ARMC)
CK TOTAL: 287 U/L (ref 49–397)
CK, MB: 6 ng/mL — AB (ref 0.5–5.0)
Relative Index: 2.1 (ref 0.0–2.5)

## 2017-11-24 LAB — PROTIME-INR
INR: 1.88
PROTHROMBIN TIME: 21.4 s — AB (ref 11.4–15.2)

## 2017-11-24 LAB — I-STAT TROPONIN, ED: Troponin i, poc: 0.09 ng/mL (ref 0.00–0.08)

## 2017-11-24 LAB — TROPONIN I: Troponin I: 0.08 ng/mL (ref <0.03)

## 2017-11-24 MED ORDER — POTASSIUM CHLORIDE CRYS ER 20 MEQ PO TBCR: 20.0000 meq | EXTENDED_RELEASE_TABLET | Freq: Every day | ORAL | Status: DC

## 2017-11-24 MED ORDER — INSULIN ASPART 100 UNIT/ML ~~LOC~~ SOLN: 0.0000 [IU] | Freq: Three times a day (TID) | SUBCUTANEOUS | Status: DC

## 2017-11-24 MED ORDER — ALBUTEROL SULFATE (2.5 MG/3ML) 0.083% IN NEBU: 2.5000 mg | INHALATION_SOLUTION | RESPIRATORY_TRACT | Status: DC | PRN

## 2017-11-24 MED ORDER — FUROSEMIDE 10 MG/ML IJ SOLN
40.0000 mg | Freq: Two times a day (BID) | INTRAMUSCULAR | Status: DC
  Administered 2017-11-24 – 2017-11-25 (×3): 40 mg via INTRAVENOUS
  Filled 2017-11-24 (×3): qty 4

## 2017-11-24 MED ORDER — WARFARIN SODIUM 3 MG PO TABS
6.0000 mg | ORAL_TABLET | Freq: Once | ORAL | Status: AC
  Administered 2017-11-24: 6 mg via ORAL
  Filled 2017-11-24: qty 2

## 2017-11-24 MED ORDER — ORAL CARE MOUTH RINSE
15.0000 mL | Freq: Two times a day (BID) | OROMUCOSAL | Status: DC
  Administered 2017-11-24 – 2017-12-06 (×19): 15 mL via OROMUCOSAL

## 2017-11-24 MED ORDER — INSULIN ASPART 100 UNIT/ML ~~LOC~~ SOLN
0.0000 [IU] | Freq: Three times a day (TID) | SUBCUTANEOUS | Status: DC
  Administered 2017-11-27: 1 [IU] via SUBCUTANEOUS

## 2017-11-24 MED ORDER — VANCOMYCIN HCL 10 G IV SOLR: 1500.0000 mg | INTRAVENOUS | Status: DC

## 2017-11-24 MED ORDER — CALCITRIOL 0.25 MCG PO CAPS
0.2500 ug | ORAL_CAPSULE | Freq: Every day | ORAL | Status: DC
  Administered 2017-11-24 – 2017-12-06 (×13): 0.25 ug via ORAL
  Filled 2017-11-24 (×13): qty 1

## 2017-11-24 MED ORDER — WARFARIN - PHARMACIST DOSING INPATIENT
Freq: Every day | Status: DC
  Administered 2017-11-27 – 2017-12-01 (×3)

## 2017-11-24 MED ORDER — SODIUM CHLORIDE 0.9 % IV SOLN
1.0000 g | INTRAVENOUS | Status: DC
  Administered 2017-11-24 – 2017-11-25 (×2): 1 g via INTRAVENOUS
  Filled 2017-11-24 (×2): qty 1

## 2017-11-24 MED ORDER — ISOSORBIDE MONONITRATE ER 30 MG PO TB24
30.0000 mg | ORAL_TABLET | Freq: Every day | ORAL | Status: DC
  Administered 2017-11-24: 30 mg via ORAL
  Filled 2017-11-24: qty 1

## 2017-11-24 MED ORDER — ADULT MULTIVITAMIN W/MINERALS CH
1.0000 | ORAL_TABLET | Freq: Every day | ORAL | Status: DC
  Administered 2017-11-24 – 2017-12-02 (×9): 1 via ORAL
  Filled 2017-11-24 (×10): qty 1

## 2017-11-24 MED ORDER — SODIUM CHLORIDE 0.9 % IV SOLN
250.0000 mL | INTRAVENOUS | Status: DC | PRN
  Administered 2017-11-24: 250 mL via INTRAVENOUS

## 2017-11-24 MED ORDER — SODIUM CHLORIDE 0.9 % IV SOLN
1.0000 g | Freq: Three times a day (TID) | INTRAVENOUS | Status: DC
  Filled 2017-11-24: qty 1

## 2017-11-24 MED ORDER — INSULIN ASPART 100 UNIT/ML ~~LOC~~ SOLN: 0.0000 [IU] | Freq: Every day | SUBCUTANEOUS | Status: DC

## 2017-11-24 MED ORDER — LEVOTHYROXINE SODIUM 75 MCG PO TABS
175.0000 ug | ORAL_TABLET | Freq: Every day | ORAL | Status: DC
  Administered 2017-11-25 – 2017-12-06 (×11): 175 ug via ORAL
  Filled 2017-11-24 (×14): qty 1

## 2017-11-24 MED ORDER — ROSUVASTATIN CALCIUM 10 MG PO TABS
20.0000 mg | ORAL_TABLET | Freq: Every day | ORAL | Status: DC
  Administered 2017-11-24 – 2017-12-06 (×13): 20 mg via ORAL
  Filled 2017-11-24: qty 2
  Filled 2017-11-24: qty 1
  Filled 2017-11-24 (×3): qty 2
  Filled 2017-11-24: qty 1
  Filled 2017-11-24: qty 2
  Filled 2017-11-24 (×2): qty 1
  Filled 2017-11-24: qty 2
  Filled 2017-11-24: qty 1
  Filled 2017-11-24 (×8): qty 2
  Filled 2017-11-24: qty 1

## 2017-11-24 MED ORDER — POTASSIUM CHLORIDE CRYS ER 20 MEQ PO TBCR
20.0000 meq | EXTENDED_RELEASE_TABLET | Freq: Once | ORAL | Status: AC
  Administered 2017-11-24: 20 meq via ORAL
  Filled 2017-11-24: qty 1

## 2017-11-24 MED ORDER — UMECLIDINIUM-VILANTEROL 62.5-25 MCG/INH IN AEPB
1.0000 | INHALATION_SPRAY | Freq: Every day | RESPIRATORY_TRACT | Status: DC
  Administered 2017-11-24 – 2017-11-30 (×7): 1 via RESPIRATORY_TRACT
  Filled 2017-11-24: qty 14

## 2017-11-24 MED ORDER — IPRATROPIUM-ALBUTEROL 0.5-2.5 (3) MG/3ML IN SOLN
3.0000 mL | Freq: Once | RESPIRATORY_TRACT | Status: AC
  Administered 2017-11-24: 3 mL via RESPIRATORY_TRACT
  Filled 2017-11-24: qty 3

## 2017-11-24 MED ORDER — POTASSIUM CHLORIDE CRYS ER 20 MEQ PO TBCR
40.0000 meq | EXTENDED_RELEASE_TABLET | Freq: Two times a day (BID) | ORAL | Status: DC
  Administered 2017-11-24 – 2017-11-27 (×8): 40 meq via ORAL
  Filled 2017-11-24 (×9): qty 2

## 2017-11-24 MED ORDER — PANTOPRAZOLE SODIUM 40 MG PO TBEC
40.0000 mg | DELAYED_RELEASE_TABLET | Freq: Every day | ORAL | Status: DC
  Administered 2017-11-24 – 2017-12-01 (×8): 40 mg via ORAL
  Filled 2017-11-24 (×8): qty 1

## 2017-11-24 MED ORDER — SODIUM CHLORIDE 0.9 % IV SOLN
1.0000 g | Freq: Once | INTRAVENOUS | Status: AC
  Administered 2017-11-24: 1 g via INTRAVENOUS
  Filled 2017-11-24: qty 10

## 2017-11-24 MED ORDER — FUROSEMIDE 10 MG/ML IJ SOLN: 20.0000 mg | Freq: Two times a day (BID) | INTRAMUSCULAR | Status: DC

## 2017-11-24 MED ORDER — FUROSEMIDE 10 MG/ML IJ SOLN
40.0000 mg | Freq: Once | INTRAMUSCULAR | Status: AC
  Administered 2017-11-24: 40 mg via INTRAVENOUS
  Filled 2017-11-24: qty 4

## 2017-11-24 MED ORDER — VANCOMYCIN HCL 10 G IV SOLR
2000.0000 mg | Freq: Once | INTRAVENOUS | Status: AC
  Administered 2017-11-24: 2000 mg via INTRAVENOUS
  Filled 2017-11-24: qty 2000

## 2017-11-24 MED ORDER — SODIUM CHLORIDE 0.9 % IV SOLN
500.0000 mg | Freq: Once | INTRAVENOUS | Status: AC
  Administered 2017-11-24: 500 mg via INTRAVENOUS
  Filled 2017-11-24: qty 500

## 2017-11-24 MED ORDER — SODIUM CHLORIDE 0.9% FLUSH: 3.0000 mL | INTRAVENOUS | Status: DC | PRN

## 2017-11-24 MED ORDER — SODIUM CHLORIDE 0.9% FLUSH
3.0000 mL | Freq: Two times a day (BID) | INTRAVENOUS | Status: DC
  Administered 2017-11-24 – 2017-12-06 (×23): 3 mL via INTRAVENOUS

## 2017-11-24 NOTE — ED Triage Notes (Signed)
Pt has hx of CHF. Feels he has "fluid"  Has had increasing SHOB x 2 weeks. No more swelling than usual per pt. Pt states he feels weak

## 2017-11-24 NOTE — ED Notes (Signed)
Dr Kem Boroughs informed of troponin results .09

## 2017-11-24 NOTE — Progress Notes (Signed)
Patient declined CPAP at this time. States he does not wear it at home. No distress noted.

## 2017-11-24 NOTE — ED Provider Notes (Signed)
[facility] EMERGENCY DEPARTMENT Provider Note  CSN: [id number] Arrival date & time: 11/24/17 0023  Chief Complaint(s) Shortness of Breath  HPI Omar Lambert is a 77 y.o. male with extensive past medical history listed below including COPD, chronic diastolic heart failure who presents to the emergency department with worsening shortness of breath.  The history is provided by the patient.  Shortness of Breath  This is a recurrent problem. Duration: 3-4 weeks. The problem has been gradually worsening. Associated symptoms include cough, sputum production, chest pain (exertional) and leg swelling. Pertinent negatives include no fever and no rhinorrhea. Associated medical issues include COPD and heart failure.    Past Medical History Past Medical History:  Diagnosis Date  . Acute kidney injury superimposed on chronic kidney disease ([place]) 11/2016  . Acute respiratory failure ([place]) 11/2016  . Allergy   . Asthma   . Atrial fibrillation ([place])   . 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 Benign essential hypertension   . CHF (congestive heart failure) ([place])   . CKD (chronic kidney disease), stage III ([place])   . COPD (chronic obstructive pulmonary disease) ([place])   . Diabetes ([place])   . Diabetes mellitus without complication ([place])   . Dyspnea   . Gastritis   . GERD (gastroesophageal reflux disease)   . Gout attack 09/2012  . Hypertension   . Sinusitis, maxillary, chronic   . Thyroid disease   . Type II or unspecified type diabetes mellitus without mention of complication, not stated as uncontrolled    Patient Active Problem List   Diagnosis Date Noted  . Pleural effusion on right 10/11/2017  . Chronic respiratory failure with hypoxia and hypercapnia ([place]) 09/10/2017  . Chronic cor pulmonale ([place]) 09/10/2017  . Acute CHF (congestive heart failure) ([place]) 08/29/2017  . Respiratory distress 08/29/2017  . Hypokalemia 08/29/2017  . Diabetic ulcer of  lower leg ([place]) 08/29/2017  . CKD (chronic kidney disease), stage III ([place])   . Acute on chronic diastolic congestive heart failure ([place])   . Elevated troponin I level   . Acute on chronic diastolic heart failure ([place]) 07/29/2017  . Chronic venous hypertension (idiopathic) with ulcer of right lower extremity (CODE) ([place]) 07/29/2017  . Blood clotting disorder ([place]) 12/03/2016  . Long term (current) use of anticoagulants [Z79.01] 11/12/2016  . Bradycardia with 41-50 beats per minute   . Acute kidney injury superimposed on chronic kidney disease ([place])   . DOE (dyspnea on exertion) 04/10/2016  . Diabetes mellitus with renal complications ([place])   . Hypothyroidism 03/29/2016  . Essential hypertension, benign 02/28/2015  . 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 Morbid obesity ([place]) 02/28/2015  . Hyperlipidemia associated with type 2 diabetes mellitus ([place]) 02/28/2015  . Persistent atrial fibrillation 12/18/2014  . Alteration in anticoagulation 12/18/2014  . Chronic kidney disease (CKD) stage G3b/A2, moderately decreased glomerular filtration rate (GFR) between 30-44 mL/min/1.73 square meter and albuminuria creatinine ratio between 30-299 mg/g (H[facility]) 08/19/2014  . Noncompliance with medication regimen 02/15/2014  . Environmental allergies 02/15/2014  . 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 Esophageal reflux 09/18/2013  . Venous insufficiency of both lower extremities 03/14/2013  . OSA on CPAP 12/22/2012  . COPD GOLD II 11/26/2012  . Asthma   . Benign essential hypertension    Home Medication(s) Prior to Admission medications   Medication Sig Start Date End Date Taking? Authorizing Provider  acetaminophen (TYLENOL) 325 MG tablet Take 650 mg by mouth every 6 (six) hours as needed for mild  pain.    [provider]  albuterol (PROVENTIL) (2.5 MG/3ML) 0.083% nebulizer solution INHALE CONTENTS OF 1 VIAL IN NEBULIZER EVERY 4 HOURS AS NEEDED FOR WHEEZING AND ASTHMA 07/18/17   [name], [name] L, DO    allopurinol (ZYLOPRIM) 300 MG tablet TAKE 1 TABLET BY MOUTH EVERY DAY 11/04/17   [name], [name] L, DO  calcitRIOL (ROCALTROL) 0.25 MCG capsule Take 0.25 mcg by mouth daily.  07/24/17   [provider]  isosorbide mononitrate (IMDUR) 30 MG 24 hr tablet Take 1 tablet (30 mg total) by mouth daily. 11/12/17   Troy Sine, MD  KLOR-CON M20 20 MEQ tablet TAKE 1 TABLET BY MOUTH EVERY DAY 04/09/16   [name], [name] L, DO  levothyroxine (SYNTHROID, LEVOTHROID) 175 MCG tablet TAKE 1 TABLET EVERY DAY BEFORE BREAKFAST 11/12/17   [name], [name] L, DO  Multiple Vitamins-Minerals (CENTRUM SILVER ADULT 50+ PO) Take 1 tablet by mouth daily.    [provider]  pantoprazole (PROTONIX) 40 MG tablet TAKE 1 TABLET BY MOUTH EVERY DAY BEFORE SUPPER 11/15/17   [name], [name] L, DO  rosuvastatin (CRESTOR) 20 MG tablet Take 1 tablet (20 mg total) by mouth daily. 11/12/17   Troy Sine, MD  SUPREP BOWEL PREP KIT 17.5-3.13-1.6 GM/177ML SOLN Take 1 kit by mouth as directed. For colonoscopy prep 10/30/17   Jerene Bears, MD  torsemide (DEMADEX) 20 MG tablet TAKE 1 TABLET TWICE A DAY 06/20/16   [name], [name] L, DO  umeclidinium-vilanterol (ANORO ELLIPTA) 62.5-25 MCG/INH AEPB Inhale 1 puff into the lungs daily. 09/10/17   Tanda Rockers, MD  UNABLE TO FIND CPAP  Unknown DME- prescribed by doc in [facility]    [provider]  warfarin (COUMADIN) 5 MG tablet TAKE 1/2 TO 1 TABLET BY MOUTH DAILY AS DIRECTED BY COUMADIN CLINIC 11/04/17   Troy Sine, MD                                                                                                                                    Past Surgical History Past Surgical History:  Procedure Laterality Date  . COLONOSCOPY  2003  . EYE SURGERY Left 05/06/2017   Cataract removed  . EYE SURGERY Right 02/05/2013   Cataract removed  . HERNIA REPAIR  1980   Family History Family History  Problem Relation Age of Onset  . Stroke Mother   . Diabetes Mother   . Cancer  Sister   . Diabetes Sister   . COPD Sister   . COPD Sister   . Diabetes Father     Social History Social History   Tobacco Use  . Smoking status: Former Smoker    Packs/day: 1.00    Years: 20.00    Pack years: 20.00    Types: Cigarettes    Last attempt to quit: 02/05/1997    Years since quitting: 20.8  . Smokeless tobacco: Never Used  Substance  Use Topics  . Alcohol use: No  . Drug use: No   Allergies Patient has no known allergies.  Review of Systems Review of Systems  Constitutional: Negative for fever.  HENT: Negative for rhinorrhea.   Respiratory: Positive for cough, sputum production and shortness of breath.   Cardiovascular: Positive for chest pain (exertional) and leg swelling.   All other systems are reviewed and are negative for acute change except as noted in the HPI  Physical Exam Vital Signs  I have reviewed the triage vital signs BP 140/76   Pulse 80   Temp 100.1 F (37.8 C) (Oral)   Resp (!) 32   SpO2 98%   Physical Exam  Constitutional: He is oriented to person, place, and time. He appears well-developed and well-nourished. No distress.  HENT:  Head: Normocephalic and atraumatic.  Nose: Nose normal.  Eyes: Pupils are equal, round, and reactive to light. Conjunctivae and EOM are normal. Right eye exhibits no discharge. Left eye exhibits no discharge. No scleral icterus.  Neck: Normal range of motion. Neck supple.  Cardiovascular: Normal rate and regular rhythm. Exam reveals no gallop and no friction rub.  No murmur heard. Pulmonary/Chest: Effort normal. No stridor. No respiratory distress. He has decreased breath sounds in the right lower field. He has wheezes (diffuse, exp). He has no rales.  Abdominal: Soft. He exhibits no distension. There is no tenderness.  Musculoskeletal: He exhibits no edema or tenderness.  1+BLE edema   Neurological: He is alert and oriented to person, place, and time.  Skin: Skin is warm and dry. No rash noted. He is  not diaphoretic. No erythema.  Psychiatric: He has a normal mood and affect.  Vitals reviewed.   ED Results and Treatments Labs (all labs ordered are listed, but only abnormal results are displayed) Labs Reviewed  BASIC METABOLIC PANEL - Abnormal; Notable for the following components:      Result Value   Potassium 3.2 (*)    CO2 35 (*)    Glucose, Bld 131 (*)    BUN 32 (*)    Creatinine, Ser 2.46 (*)    GFR calc non Af Amer 24 (*)    GFR calc Af Amer 27 (*)    All other components within normal limits  CBC - Abnormal; Notable for the following components:   WBC 3.9 (*)    RBC 3.64 (*)    Hemoglobin 11.3 (*)    MCV 107.1 (*)    MCHC 29.0 (*)    RDW 19.2 (*)    All other components within normal limits  I-STAT TROPONIN, ED - Abnormal; Notable for the following components:   Troponin i, poc 0.09 (*)    All other components within normal limits  PROTIME-INR  BRAIN NATRIURETIC PEPTIDE  BLOOD GAS, ARTERIAL  EKG  EKG Interpretation  Date/Time:  Sunday November 24 2017 00:40:20 EDT Ventricular Rate:  86 PR Interval:    QRS Duration: 98 QT Interval:  396 QTC Calculation: 473 R Axis:   -95 Text Interpretation:  Atrial fibrillation Right superior axis deviation Incomplete right bundle branch block Abnormal ECG no stemi Otherwise no significant change Confirmed by Addison Lank [phone]) on 11/24/2017 1:49:54 AM      Radiology Dg Chest 2 View  Result Date: 11/24/2017 CLINICAL DATA:  CHF with feeling of fluid buildup and increased dyspnea. EXAM: CHEST - 2 VIEW COMPARISON:  [facility] dating back through 11/06/2016. FINDINGS: Chronic obscuration of the right costophrenic angle and portions of the right hemidiaphragm from presumed pulmonary consolidation and effusion are redemonstrated. Slight interval increase in size of the consolidation and/or fluid is seen at the  right lung base with slight increase in interstitial edema. Stable cardiomegaly with moderate aortic atherosclerosis. Acute osseous abnormality. IMPRESSION: Slight interval increase in pulmonary consolidation and effusion at the right base with interval increase in interstitial edema, suggesting slight interval worsening of pulmonary edema. Cardiomegaly with aortic atherosclerosis is stable. Electronically Signed   By: Ashley Royalty M.D.   On: 11/24/2017 01:14   Pertinent labs & imaging results that were available during my care of the patient were reviewed by me and considered in my medical decision making (see chart for details).  Medications Ordered in ED Medications  cefTRIAXone (ROCEPHIN) 1 g in sodium chloride 0.9 % 100 mL IVPB (has no administration in time range)  azithromycin (ZITHROMAX) 500 mg in sodium chloride 0.9 % 250 mL IVPB (has no administration in time range)  ipratropium-albuterol (DUONEB) 0.5-2.5 (3) MG/3ML nebulizer solution 3 mL (has no administration in time range)  furosemide (LASIX) injection 40 mg (has no administration in time range)                                                                                                                                    Procedures Procedures  (including critical care time)  Medical Decision Making / ED Course I have reviewed the nursing notes for this encounter and the patient's prior records (if available in EHR or on provided paperwork).    Work-up notable for right lower lobe opacity with pleural effusion.  Thought to be localized pulmonary edema versus pneumonia.  Given mildly elevated temperature, patient was treated empirically for community-acquired pneumonia.  ABG notable for compensated respiratory acidosis.  Patient is requiring oxygen for comfort.  Admitted to medicine for continued management.  Final Clinical Impression(s) / ED Diagnoses Final diagnoses:  COPD exacerbation ([place])  Pleural effusion on right  SOB  (shortness of breath)      This chart was dictated using voice recognition software.  Despite best efforts to proofread,  errors can occur which can change the documentation meaning.   Fatima Blank, MD 11/24/17 [phone]

## 2017-11-24 NOTE — ED Notes (Signed)
Pt's SpO2 86% on room air, 2L [place] applied, improved to 94%.

## 2017-11-24 NOTE — Progress Notes (Signed)
PROGRESS NOTE    Omar Lambert  AJO:[id number] DOB: [birth year] DOA: 11/24/2017 PCP: Gayland Curry, DO  Brief Narrative: 77 year old male with history of type 2 diabetes mellitus, COPD, hypothyroidism, hypertension, chronic kidney disease stage III, paroxysmal atrial fibrillation, diabetes mellitus type 2 presented to the emergency room with progressive dyspnea on exertion, productive cough, orthopnea, lower extremity edema for 2 weeks. -He denies fevers but temp was 100.1 in the emergency room, chest x-ray in the ED noted interval increase in pulmonary consolidation of the right base with increased interstitial edema, worsening pulmonary edema   Assessment & Plan:   Acute hypoxic respiratory failure -Primarily secondary to acute on chronic diastolic CHF and pulmonary hypertension -Improving but remains volume overloaded with 2+ edema, JVD, abdominal wall edema and crackles on lung exam, remains hypoxic requiring 2 L O2 -Has underlying COPD -Has chronic scarring at the right base, clinically low suspicion for pneumonia at this time despite temp of 100 on admission -Will discontinue vancomycin, continue cefepime today and de-escalate to oral antibiotics tomorrow for a 5-day course -Continue IV Lasix today -Monitor urine output -To Bmet closely  Acute on chronic diastolic CHF/Pulm HTN -Last echo 7/201 9 with EF of 50%,, moderate pulmonary hypertension PA pressures of 47 mmHg -Diuretics as noted above  COPD -Chronic respiratory failure -No wheezing at this time, continue nebs  Chronic kidney disease stage III  -Monitor with diuresis, stable and close to baseline  Diabetes mellitus -Likely diet controlled, not on medications for this -Check hemoglobin A1c, continue sliding scale insulin  Elevated troponin -This is mild with a flat trend, no evidence of ACS  Paroxysmal atrial fibrillation -Rate controlled, continue Coumadin  Hypothyroidism -Continue Synthroid  DVT  prophylaxis: On warfarin Code Status: Full code Family Communication: No family at bedside Disposition Plan: Home in 48 to 72 hours pending adequate diuresis  Consultants:    Procedures:   Antimicrobials:    Subjective: -breathing a little better, still dyspnea on exertion with limited activity -not Back to baseline yet -Denies any fevers or chills, reports compliance with torsemide  Objective: Vitals:   11/24/17 0430 11/24/17 0600 11/24/17 0732 11/24/17 1000  BP: (!) 129/59  (!) 126/99   Pulse: 77  82 83  Resp: (!) 25  16 16   Temp:   97.8 F (36.6 C)   TempSrc:   Oral   SpO2: 94%  97% 97%  Weight:  105.7 kg    Height:  5\' 7"  (1.702 m)      Intake/Output Summary (Last 24 hours) at 11/24/2017 1056 Last data filed at 11/24/2017 0900 Gross per 24 hour  Intake 621.4 ml  Output 277 ml  Net 344.4 ml   Filed Weights   11/24/17 0600  Weight: 105.7 kg    Examination:  Gen: Awake, Alert, Oriented X 3, elderly obese chronically ill-appearing male HEENT: PERRLA, positive JVD Lungs: Crackles at both lung bases CVS: S1-S2/regular rate rhythm Abd: soft, Non tender, non distended, BS present, abdominal wall edema noted Extremities: 2+ edema Skin: no new rashes Psychiatry: Judgement and insight appear normal. Mood & affect appropriate.     Data Reviewed:   CBC: Recent Labs  Lab 11/24/17 0041  WBC 3.9*  HGB 11.3*  HCT 39.0  MCV 107.1*  PLT 947   Basic Metabolic Panel: Recent Labs  Lab 11/24/17 0041  NA 144  K 3.2*  CL 98  CO2 35*  GLUCOSE 131*  BUN 32*  CREATININE 2.46*  CALCIUM 9.4   GFR: Estimated  Creatinine Clearance: 28.7 mL/min (A) (by C-G formula based on SCr of 2.46 mg/dL (H)). Liver Function Tests: No results for input(s): AST, ALT, ALKPHOS, BILITOT, PROT, ALBUMIN in the last 168 hours. No results for input(s): LIPASE, AMYLASE in the last 168 hours. No results for input(s): AMMONIA in the last 168 hours. Coagulation Profile: Recent Labs    Lab 11/24/17 0147  INR 1.88   Cardiac Enzymes: Recent Labs  Lab 11/24/17 0546  CKTOTAL 287  CKMB 6.0*  TROPONINI 0.08*   BNP (last 3 results) No results for input(s): PROBNP in the last 8760 hours. HbA1C: No results for input(s): HGBA1C in the last 72 hours. CBG: Recent Labs  Lab 11/24/17 0727  GLUCAP 77   Lipid Profile: No results for input(s): CHOL, HDL, LDLCALC, TRIG, CHOLHDL, LDLDIRECT in the last 72 hours. Thyroid Function Tests: No results for input(s): TSH, T4TOTAL, FREET4, T3FREE, THYROIDAB in the last 72 hours. Anemia Panel: No results for input(s): VITAMINB12, FOLATE, FERRITIN, TIBC, IRON, RETICCTPCT in the last 72 hours. Urine analysis:    Component Value Date/Time   COLORURINE YELLOW 10/27/2016 0113   APPEARANCEUR CLEAR 10/27/2016 0113   LABSPEC 1.013 10/27/2016 0113   PHURINE 6.0 10/27/2016 0113   GLUCOSEU NEGATIVE 10/27/2016 0113   HGBUR NEGATIVE 10/27/2016 0113   BILIRUBINUR NEGATIVE 10/27/2016 0113   KETONESUR NEGATIVE 10/27/2016 0113   PROTEINUR 100 (A) 10/27/2016 0113   UROBILINOGEN 0.2 06/17/2012 0622   NITRITE NEGATIVE 10/27/2016 0113   LEUKOCYTESUR NEGATIVE 10/27/2016 0113   Sepsis Labs: @LABRCNTIP (procalcitonin:4,lacticidven:4)  )No results found for this or any previous visit (from the past 240 hour(s)).       Radiology Studies: Dg Chest 2 View  Result Date: 11/24/2017 CLINICAL DATA:  CHF with feeling of fluid buildup and increased dyspnea. EXAM: CHEST - 2 VIEW COMPARISON:  Priors dating back through 11/06/2016. FINDINGS: Chronic obscuration of the right costophrenic angle and portions of the right hemidiaphragm from presumed pulmonary consolidation and effusion are redemonstrated. Slight interval increase in size of the consolidation and/or fluid is seen at the right lung base with slight increase in interstitial edema. Stable cardiomegaly with moderate aortic atherosclerosis. Acute osseous abnormality. IMPRESSION: Slight interval  increase in pulmonary consolidation and effusion at the right base with interval increase in interstitial edema, suggesting slight interval worsening of pulmonary edema. Cardiomegaly with aortic atherosclerosis is stable. Electronically Signed   By: Ashley Royalty M.D.   On: 11/24/2017 01:14        Scheduled Meds: . calcitRIOL  0.25 mcg Oral Daily  . furosemide  40 mg Intravenous BID  . insulin aspart  0-5 Units Subcutaneous QHS  . insulin aspart  0-9 Units Subcutaneous TID WC  . isosorbide mononitrate  30 mg Oral Daily  . [START ON 11/25/2017] levothyroxine  175 mcg Oral QAC breakfast  . mouth rinse  15 mL Mouth Rinse BID  . multivitamin with minerals  1 tablet Oral Daily  . pantoprazole  40 mg Oral Daily  . potassium chloride  40 mEq Oral BID  . rosuvastatin  20 mg Oral Daily  . sodium chloride flush  3 mL Intravenous Q12H  . umeclidinium-vilanterol  1 puff Inhalation Daily  . warfarin  6 mg Oral ONCE-1800  . Warfarin - Pharmacist Dosing Inpatient   Does not apply q1800   Continuous Infusions: . sodium chloride 250 mL (11/24/17 0634)  . ceFEPime (MAXIPIME) IV 1 g (11/24/17 0555)     LOS: 0 days    Time spent: 41min  Domenic Polite, MD [facility] Page via [email or web address], password [email or web address] After 7PM please contact night-coverage  11/24/2017, 10:56 AM

## 2017-11-24 NOTE — H&P (Signed)
TRH H&P   Patient Demographics:    Omar Lambert, is a 77 y.o. male  MRN: [id number]   DOB - [birth year]  Admit Date - 11/24/2017  Outpatient Primary MD for the patient is Gayland Curry, DO  Referring MD/NP/PA: Addison Lank  Outpatient Specialists:     Patient coming from: home  Chief Complaint  Patient presents with  . Shortness of Breath      HPI:    Omar Lambert  is a 77 y.o. male, w hypothyroidism,  hypertension, hyperlipidemia, dm2, ckd stage3, Pafib, CHF (diastolic), Copd apparently c/o dyspnea. Pt states has been going on for about 2 weeks but worse last nite. + cough with brown sputum.  Denies fever, chills, cp, palp, n/v, diarrhea, brbpr, dysuria, hematuria.  Pt presented to ED for evaluation.  In Ed,  T 100.1  P 77 R 25 Bp  129/59 pox 94%  On 2L [place]  Na 144, K 3.2,  Bun 32, Creatinine 2.46 Wbc 3.9, Hgb 11.3, Plt 157 Trop 0.09 BNP 538.4 INR 1.88  ABG ph 7.346, PCo2 78, po2 105  CXR IMPRESSION: Slight interval increase in pulmonary consolidation and effusion at the right base with interval increase in interstitial edema, suggesting slight interval worsening of pulmonary edema. Cardiomegaly with aortic atherosclerosis is stable.  Pt will be admitted for Hcap and ? Effusion and mild acute CHF (diastolic),        Review of systems:    In addition to the HPI above, No Fever-chills, No Headache, No changes with Vision or hearing, No problems swallowing food or Liquids, No Chest pain, No Abdominal pain, No Nausea or Vommitting, Bowel movements are regular, No Blood in stool or Urine, No dysuria, No new skin rashes or bruises, No new joints pains-aches,  No new weakness, tingling, numbness in any extremity, No recent weight gain or loss, No polyuria, polydypsia or polyphagia, No significant Mental Stressors.  A full 10 point Review of Systems was  done, except as stated above, all other Review of Systems were negative.   With Past History of the following :    Past Medical History:  Diagnosis Date  . Acute kidney injury superimposed on chronic kidney disease ([place]) 11/2016  . Acute respiratory failure ([place]) 11/2016  . Allergy   . Asthma   . Atrial fibrillation ([place])   . 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 Benign essential hypertension   . CHF (congestive heart failure) ([place])   . CKD (chronic kidney disease), stage III ([place])   . COPD (chronic obstructive pulmonary disease) ([place])   . Diabetes ([place])   . Diabetes mellitus without complication ([place])   . Dyspnea   . Gastritis   . GERD (gastroesophageal reflux disease)   . Gout attack 09/2012  . Hypertension   . Sinusitis, maxillary, chronic   . Thyroid disease   . Type  II or unspecified type diabetes mellitus without mention of complication, not stated as uncontrolled       Past Surgical History:  Procedure Laterality Date  . COLONOSCOPY  2003  . EYE SURGERY Left 05/06/2017   Cataract removed  . EYE SURGERY Right 02/05/2013   Cataract removed  . HERNIA REPAIR  1980      Social History:     Social History   Tobacco Use  . Smoking status: Former Smoker    Packs/day: 1.00    Years: 20.00    Pack years: 20.00    Types: Cigarettes    Last attempt to quit: 02/05/1997    Years since quitting: 20.8  . Smokeless tobacco: Never Used  Substance Use Topics  . Alcohol use: No     Lives - at home  Mobility - walks by self   Family History :     Family History  Problem Relation Age of Onset  . Stroke Mother   . Diabetes Mother   . Cancer Sister   . Diabetes Sister   . COPD Sister   . COPD Sister   . Diabetes Father        Home Medications:   Prior to Admission medications   Medication Sig Start Date End Date Taking? Authorizing Provider  acetaminophen (TYLENOL) 325 MG tablet Take 650 mg by mouth every 6 (six) hours as needed for  mild pain.   Yes [provider]  albuterol (PROVENTIL) (2.5 MG/3ML) 0.083% nebulizer solution INHALE CONTENTS OF 1 VIAL IN NEBULIZER EVERY 4 HOURS AS NEEDED FOR WHEEZING AND ASTHMA Patient taking differently: Take 2.5 mg by nebulization every 4 (four) hours as needed for wheezing.  07/18/17  Yes [name], [name] L, DO  allopurinol (ZYLOPRIM) 300 MG tablet TAKE 1 TABLET BY MOUTH EVERY DAY Patient taking differently: Take 300 mg by mouth daily.  11/04/17  Yes [name], [name] L, DO  calcitRIOL (ROCALTROL) 0.25 MCG capsule Take 0.25 mcg by mouth daily.  07/24/17  Yes [provider]  isosorbide mononitrate (IMDUR) 30 MG 24 hr tablet Take 1 tablet (30 mg total) by mouth daily. 11/12/17  Yes Troy Sine, MD  KLOR-CON M20 20 MEQ tablet TAKE 1 TABLET BY MOUTH EVERY DAY Patient taking differently: Take 20 mEq by mouth daily.  04/09/16  Yes [name], [name] L, DO  levothyroxine (SYNTHROID, LEVOTHROID) 175 MCG tablet TAKE 1 TABLET EVERY DAY BEFORE BREAKFAST Patient taking differently: Take 175 mcg by mouth daily before breakfast.  11/12/17  Yes [name], [name] L, DO  Multiple Vitamins-Minerals (CENTRUM SILVER ADULT 50+ PO) Take 1 tablet by mouth daily.   Yes [provider]  pantoprazole (PROTONIX) 40 MG tablet TAKE 1 TABLET BY MOUTH EVERY DAY BEFORE SUPPER Patient taking differently: Take 40 mg by mouth daily.  11/15/17  Yes [name], [name] L, DO  rosuvastatin (CRESTOR) 20 MG tablet Take 1 tablet (20 mg total) by mouth daily. 11/12/17  Yes Troy Sine, MD  torsemide (DEMADEX) 20 MG tablet TAKE 1 TABLET TWICE A DAY Patient taking differently: Take 20 mg by mouth 2 (two) times daily.  06/20/16  Yes [name], [name] L, DO  umeclidinium-vilanterol (ANORO ELLIPTA) 62.5-25 MCG/INH AEPB Inhale 1 puff into the lungs daily. 09/10/17  Yes Tanda Rockers, MD  warfarin (COUMADIN) 5 MG tablet TAKE 1/2 TO 1 TABLET BY MOUTH DAILY AS DIRECTED BY COUMADIN CLINIC Patient taking differently: Take 5 mg by mouth  every evening.  11/04/17  Yes Troy Sine, MD  SUPREP BOWEL PREP KIT 17.5-3.13-1.6 GM/177ML SOLN Take 1 kit by mouth as directed. For colonoscopy prep Patient not taking: Reported on 11/24/2017 10/30/17   Pyrtle, Lajuan Lines, MD  UNABLE TO FIND CPAP  Unknown DME- prescribed by doc in [facility]    [provider]     Allergies:    No Known Allergies   Physical Exam:   Vitals  Blood pressure (!) 129/59, pulse 77, temperature 100.1 F (37.8 C), temperature source Oral, resp. rate (!) 25, SpO2 94 %.   1. General  lying in bed in NAD,    2. Normal affect and insight, Not Suicidal or Homicidal, Awake Alert, Oriented X 3.  3. No F.N deficits, ALL C.Nerves Intact, Strength 5/5 all 4 extremities, Sensation intact all 4 extremities, Plantars down going.  4. Ears and Eyes appear Normal, Conjunctivae clear, PERRLA. Moist Oral Mucosa.  5. Supple Neck, No JVD, No cervical lymphadenopathy appriciated, No Carotid Bruits.  6. Symmetrical Chest wall movement,slight decrease in bs in right lung base, and few bibasilar crackles  7. RRR, No Gallops, Rubs or Murmurs, No Parasternal Heave.  8. Positive Bowel Sounds, Abdomen Soft, No tenderness, No organomegaly appriciated,No rebound -guarding or rigidity.  9.  No Cyanosis, Normal Skin Turgor, No Skin Rash or Bruise.  10. Good muscle tone,  joints appear normal , no effusions, Normal ROM.  11. No Palpable Lymph Nodes in Neck or Axillae     Data Review:    CBC Recent Labs  Lab 11/24/17 0041  WBC 3.9*  HGB 11.3*  HCT 39.0  PLT 157  MCV 107.1*  MCH 31.0  MCHC 29.0*  RDW 19.2*   ------------------------------------------------------------------------------------------------------------------  Chemistries  Recent Labs  Lab 11/24/17 0041  NA 144  K 3.2*  CL 98  CO2 35*  GLUCOSE 131*  BUN 32*  CREATININE 2.46*  CALCIUM 9.4    ------------------------------------------------------------------------------------------------------------------ CrCl cannot be calculated (Unknown ideal weight.). ------------------------------------------------------------------------------------------------------------------ No results for input(s): TSH, T4TOTAL, T3FREE, THYROIDAB in the last 72 hours.  Invalid input(s): FREET3  Coagulation profile Recent Labs  Lab 11/24/17 0147  INR 1.88   ------------------------------------------------------------------------------------------------------------------- No results for input(s): DDIMER in the last 72 hours. -------------------------------------------------------------------------------------------------------------------  Cardiac Enzymes No results for input(s): CKMB, TROPONINI, MYOGLOBIN in the last 168 hours.  Invalid input(s): CK ------------------------------------------------------------------------------------------------------------------    Component Value Date/Time   BNP 538.4 (H) 11/24/2017 0147   BNP 544 (H) 07/09/2017 1345     ---------------------------------------------------------------------------------------------------------------  Urinalysis    Component Value Date/Time   COLORURINE YELLOW 10/27/2016 0113   APPEARANCEUR CLEAR 10/27/2016 0113   LABSPEC 1.013 10/27/2016 0113   PHURINE 6.0 10/27/2016 0113   GLUCOSEU NEGATIVE 10/27/2016 0113   HGBUR NEGATIVE 10/27/2016 0113   BILIRUBINUR NEGATIVE 10/27/2016 0113   KETONESUR NEGATIVE 10/27/2016 0113   PROTEINUR 100 (A) 10/27/2016 0113   UROBILINOGEN 0.2 06/17/2012 0622   NITRITE NEGATIVE 10/27/2016 0113   LEUKOCYTESUR NEGATIVE 10/27/2016 0113    ----------------------------------------------------------------------------------------------------------------   Imaging Results:    Dg Chest 2 View  Result Date: 11/24/2017 CLINICAL DATA:  CHF with feeling of fluid buildup and increased dyspnea.  EXAM: CHEST - 2 VIEW COMPARISON:  Priors dating back through 11/06/2016. FINDINGS: Chronic obscuration of the right costophrenic angle and portions of the right hemidiaphragm from presumed pulmonary consolidation and effusion are redemonstrated. Slight interval increase in size of the consolidation and/or fluid is seen at the right lung base with slight increase in interstitial edema. Stable cardiomegaly with moderate aortic atherosclerosis. Acute osseous abnormality. IMPRESSION:  Slight interval increase in pulmonary consolidation and effusion at the right base with interval increase in interstitial edema, suggesting slight interval worsening of pulmonary edema. Cardiomegaly with aortic atherosclerosis is stable. Electronically Signed   By: Ashley Royalty M.D.   On: 11/24/2017 01:14    ekg afib at 10, lad, poor R progression   Assessment & Plan:    Principal Problem:   Pneumonia Active Problems:   COPD with acute exacerbation (HCC)   Chronic kidney disease (CKD) stage G3b/A2, moderately decreased glomerular filtration rate (GFR) between 30-44 mL/min/1.73 square meter and albuminuria creatinine ratio between 30-299 mg/g (HCC)   Hypothyroidism   Diabetes mellitus with renal complications (HCC)   Elevated troponin I level   Anemia   HCAP (healthcare-associated pneumonia)    Hcap Blood culture x2 Sputum culture Urine strep antigen Urine legionella antigen vanco iv, cefepime iv pharmacy to dose  Pleural effusion (right) Check CT chest w/o contrast  Acute mild CHF (diastolic) Tele Trop I M6Y x3 Check cardiac echo HOLD Torsemide Lasix 68m iv bid Cont Imdur 367mpo qday Cardiology consult requested via email  Elevated troponin Trend trop as above Check cpk, mb  Hypokalemia Replete Check cmp in am  Copd Cont Anoro Cont albuterol neb q4h prn   Pafib Cont coumadin pharmacy to dose  Hypothyroidism Cont Levothyroxine  Gerd Con PPI  Hyperlipidemia Cont Crestor 2012mo  qhs  Gout Cont Allopurinol  CKD stage3 Cont Rocaltrol Check cmp in am    DVT Prophylaxis coumadin - SCDs   AM Labs Ordered, also please review Full Orders  Family Communication: Admission, patients condition and plan of care including tests being ordered have been discussed with the patient  who indicate understanding and agree with the plan and Code Status.  Code Status  FULL CODE  Likely DC to  home  Condition GUARDED   Consults called:  none  Admission status:  Inpatient, pt will require hospitalization for iv abx for hcap, and iv lasix for acute diastolic chf, and close monitoring of renal function due to ckd stage3. Pt will need inpatient status 2+ nites stay to await culture results and treatment of hcap  Time spent in minutes : 70   JamJani GravelD on 11/24/2017 at 5:11 AM  Between 7am to 7pm - Pager - [phone] [phone] go to [email or web address] - password [email or web address] - Office  [phone]

## 2017-11-24 NOTE — Progress Notes (Signed)
ANTICOAGULATION and ANTIBIOTIC CONSULT NOTE - Initial Consult  Pharmacy Consult for Coumadin and Vancocin Indication: atrial fibrillation and pneumonia  No Known Allergies  Patient Measurements: Height: '5\' 7"'  (170.2 cm) Weight: 233 lb (105.7 kg) IBW/kg (Calculated) : 66.1  Vital Signs: Temp: 100.1 F (37.8 C) (10/20 0032) Temp Source: Oral (10/20 0032) BP: 129/59 (10/20 0430) Pulse Rate: 77 (10/20 0430)  Labs: Recent Labs    11/24/17 0041 11/24/17 0147  HGB 11.3*  --   HCT 39.0  --   PLT 157  --   LABPROT  --  21.4*  INR  --  1.88  CREATININE 2.46*  --     Estimated Creatinine Clearance: 28.7 mL/min (A) (by C-G formula based on SCr of 2.46 mg/dL (H)).   Medical History: Past Medical History:  Diagnosis Date  . Acute kidney injury superimposed on chronic kidney disease ([place]) 11/2016  . Acute respiratory failure ([place]) 11/2016  . Allergy   . Asthma   . Atrial fibrillation ([place])   . 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 Benign essential hypertension   . CHF (congestive heart failure) ([place])   . CKD (chronic kidney disease), stage III ([place])   . COPD (chronic obstructive pulmonary disease) ([place])   . Diabetes ([place])   . Diabetes mellitus without complication ([place])   . Dyspnea   . Gastritis   . GERD (gastroesophageal reflux disease)   . Gout attack 09/2012  . Hypertension   . Sinusitis, maxillary, chronic   . Thyroid disease   . Type II or unspecified type diabetes mellitus without mention of complication, not stated as uncontrolled     Medications:  Medications Prior to Admission  Medication Sig Dispense Refill Last Dose  . acetaminophen (TYLENOL) 325 MG tablet Take 650 mg by mouth every 6 (six) hours as needed for mild pain.   Past Week at Unknown time  . albuterol (PROVENTIL) (2.5 MG/3ML) 0.083% nebulizer solution INHALE CONTENTS OF 1 VIAL IN NEBULIZER EVERY 4 HOURS AS NEEDED FOR WHEEZING AND ASTHMA (Patient taking differently: Take 2.5 mg  by nebulization every 4 (four) hours as needed for wheezing. ) 75 mL 4 11/23/2017 at Unknown time  . allopurinol (ZYLOPRIM) 300 MG tablet TAKE 1 TABLET BY MOUTH EVERY DAY (Patient taking differently: Take 300 mg by mouth daily. ) 30 tablet 6 11/23/2017 at Unknown time  . calcitRIOL (ROCALTROL) 0.25 MCG capsule Take 0.25 mcg by mouth daily.    11/23/2017 at Unknown time  . isosorbide mononitrate (IMDUR) 30 MG 24 hr tablet Take 1 tablet (30 mg total) by mouth daily. 30 tablet 3 11/23/2017 at Unknown time  . KLOR-CON M20 20 MEQ tablet TAKE 1 TABLET BY MOUTH EVERY DAY (Patient taking differently: Take 20 mEq by mouth daily. ) 30 tablet 1 11/23/2017 at Unknown time  . levothyroxine (SYNTHROID, LEVOTHROID) 175 MCG tablet TAKE 1 TABLET EVERY DAY BEFORE BREAKFAST (Patient taking differently: Take 175 mcg by mouth daily before breakfast. ) 30 tablet 3 11/23/2017 at Unknown time  . Multiple Vitamins-Minerals (CENTRUM SILVER ADULT 50+ PO) Take 1 tablet by mouth daily.   11/23/2017 at Unknown time  . pantoprazole (PROTONIX) 40 MG tablet TAKE 1 TABLET BY MOUTH EVERY DAY BEFORE SUPPER (Patient taking differently: Take 40 mg by mouth daily. ) 90 tablet 0 11/23/2017 at Unknown time  . rosuvastatin (CRESTOR) 20 MG tablet Take 1 tablet (20 mg total) by mouth daily. 30 tablet 3 11/23/2017 at Unknown time  . torsemide (DEMADEX)  20 MG tablet TAKE 1 TABLET TWICE A DAY (Patient taking differently: Take 20 mg by mouth 2 (two) times daily. ) 60 tablet 3 11/23/2017 at Unknown time  . umeclidinium-vilanterol (ANORO ELLIPTA) 62.5-25 MCG/INH AEPB Inhale 1 puff into the lungs daily. 30 each 11 11/23/2017 at Unknown time  . warfarin (COUMADIN) 5 MG tablet TAKE 1/2 TO 1 TABLET BY MOUTH DAILY AS DIRECTED BY COUMADIN CLINIC (Patient taking differently: Take 5 mg by mouth every evening. ) 30 tablet 0 11/23/2017 at 2130  . SUPREP BOWEL PREP KIT 17.5-3.13-1.6 GM/177ML SOLN Take 1 kit by mouth as directed. For colonoscopy prep (Patient not  taking: Reported on 11/24/2017) 2 Bottle 0 Completed Course at Unknown time  . UNABLE TO FIND CPAP  Unknown DME- prescribed by doc in [facility]   Taking   Scheduled:  . furosemide  20 mg Intravenous BID  . insulin aspart  0-5 Units Subcutaneous QHS  . insulin aspart  0-9 Units Subcutaneous TID WC  . mouth rinse  15 mL Mouth Rinse BID  . sodium chloride flush  3 mL Intravenous Q12H   Infusions:  . sodium chloride    . ceFEPime (MAXIPIME) IV 1 g (11/24/17 0555)  . vancomycin      Assessment: 77yo male c/o worsening SOB over 2wk, admitted for PNA and IV ABX, to continue Coumadin for Afib; current INR below goal with last Coumadin dose taken 10/19.  Goal of Therapy:  INR 2-3   Plan:  Will give vancomycin 2022m IV x1 followed by 15050mIV Q48H for goal trough 15-20.  Will give boosted Coumadin dose of 25m65mnd monitor INR for dose adjustments.  VerWynona NeatharmD, BCPS  11/24/2017,6:09 AM

## 2017-11-25 DIAGNOSIS — J90 Pleural effusion, not elsewhere classified: Secondary | ICD-10-CM

## 2017-11-25 LAB — CBC
HEMATOCRIT: 36.9 % — AB (ref 39.0–52.0)
HEMOGLOBIN: 10.4 g/dL — AB (ref 13.0–17.0)
MCH: 30.2 pg (ref 26.0–34.0)
MCHC: 28.2 g/dL — ABNORMAL LOW (ref 30.0–36.0)
MCV: 107.3 fL — AB (ref 80.0–100.0)
Platelets: 150 10*3/uL (ref 150–400)
RBC: 3.44 MIL/uL — AB (ref 4.22–5.81)
RDW: 19.1 % — ABNORMAL HIGH (ref 11.5–15.5)
WBC: 3.9 10*3/uL — ABNORMAL LOW (ref 4.0–10.5)
nRBC: 0 % (ref 0.0–0.2)

## 2017-11-25 LAB — BASIC METABOLIC PANEL
Anion gap: 9 (ref 5–15)
BUN: 33 mg/dL — ABNORMAL HIGH (ref 8–23)
CHLORIDE: 98 mmol/L (ref 98–111)
CO2: 34 mmol/L — AB (ref 22–32)
Calcium: 8.8 mg/dL — ABNORMAL LOW (ref 8.9–10.3)
Creatinine, Ser: 2.31 mg/dL — ABNORMAL HIGH (ref 0.61–1.24)
GFR calc Af Amer: 29 mL/min — ABNORMAL LOW (ref >60)
GFR calc non Af Amer: 25 mL/min — ABNORMAL LOW (ref >60)
Glucose, Bld: 107 mg/dL — ABNORMAL HIGH (ref 70–99)
POTASSIUM: 3.7 mmol/L (ref 3.5–5.1)
SODIUM: 141 mmol/L (ref 135–145)

## 2017-11-25 LAB — GLUCOSE, CAPILLARY
Glucose-Capillary: 79 mg/dL (ref 70–99)
Glucose-Capillary: 87 mg/dL (ref 70–99)
Glucose-Capillary: 96 mg/dL (ref 70–99)
Glucose-Capillary: 125 mg/dL — ABNORMAL HIGH (ref 70–99)

## 2017-11-25 LAB — PROTIME-INR
INR: 1.83
Prothrombin Time: 20.9 seconds — ABNORMAL HIGH (ref 11.4–15.2)

## 2017-11-25 MED ORDER — WARFARIN SODIUM 5 MG PO TABS
5.0000 mg | ORAL_TABLET | Freq: Once | ORAL | Status: AC
  Administered 2017-11-25: 5 mg via ORAL
  Filled 2017-11-25: qty 1

## 2017-11-25 MED ORDER — CEFDINIR 300 MG PO CAPS
300.0000 mg | ORAL_CAPSULE | Freq: Two times a day (BID) | ORAL | Status: DC
  Administered 2017-11-25 – 2017-11-27 (×4): 300 mg via ORAL
  Filled 2017-11-25 (×4): qty 1

## 2017-11-25 MED ORDER — FUROSEMIDE 10 MG/ML IJ SOLN
80.0000 mg | Freq: Two times a day (BID) | INTRAMUSCULAR | Status: DC
  Administered 2017-11-25 – 2017-11-26 (×3): 80 mg via INTRAVENOUS
  Filled 2017-11-25 (×3): qty 8

## 2017-11-25 NOTE — Progress Notes (Addendum)
PROGRESS NOTE    Omar Lambert  PFX:[id number] DOB: 1940-07-20 DOA: 11/24/2017 PCP: Gayland Curry, DO  Brief Narrative: 77 year old male with history of type 2 diabetes mellitus, COPD, hypothyroidism, hypertension, chronic kidney disease stage III, paroxysmal atrial fibrillation, diabetes mellitus type 2 presented to the emergency room with progressive dyspnea on exertion, productive cough, orthopnea, lower extremity edema for 2 weeks. -He denies fevers but temp was 100.1 in the emergency room, chest x-ray in the ED noted interval increase in pulmonary consolidation of the right base with increased interstitial edema, worsening pulmonary edema   Assessment & Plan:   Acute hypoxic respiratory failure -Primarily secondary to acute on chronic diastolic CHF and pulmonary hypertension -Improving but remains volume overloaded with 2+ edema, JVD, abdominal wall edema and crackles on lung exam, remains hypoxic requiring 2 L O2 -Has underlying COPD -Has chronic scarring at the right base, clinically low suspicion for pneumonia  -stopped IV AVnc, stop cefepime, change to Po Abx for 5days -continue IV lasix needs -Will discontinue vancomycin, continue cefepime today and de-escalate to oral antibiotics tomorrow for a 5-day course -Continue IV Lasix today, increase dose to 80mg  BID -KCL, monitor Bmet  Acute on chronic diastolic CHF/Pulm HTN -Last echo 7/201 9 with EF of 50%,, moderate pulmonary hypertension PA pressures of 47 mmHg -Diuretics as noted above  COPD -Chronic respiratory failure -No wheezing at this time, continue nebs  OSA -continue CPAP QHS -pt was using and benefiting from his CPAP at home but needs a new one as the old one keeps cutting off   Chronic kidney disease stage 4 -baseline creatinine 2.3-2.5 -Monitor with diuresis, stable and close to baseline  Diabetes mellitus -Likely diet controlled, not on medications for this -Check hemoglobin A1c, continue sliding scale  insulin  Elevated troponin -This is mild with a flat trend, no evidence of ACS  Paroxysmal atrial fibrillation -Rate controlled, continue Coumadin  Hypothyroidism -Continue Synthroid  DVT prophylaxis: On warfarin Code Status: Full code Family Communication: wife at bedside Disposition Plan: Home in 3-4 pending adequate diuresis  Consultants:    Procedures:   Antimicrobials:    Subjective: -breathing improving, not back to baseline yet -some cough  Objective: Vitals:   11/24/17 1610 11/24/17 2204 11/25/17 0749 11/25/17 0809  BP: 137/80 116/88  119/80  Pulse: 83 71  80  Resp: 15 16  16   Temp: 98 F (36.7 C) 97.7 F (36.5 C)  97.9 F (36.6 C)  TempSrc: Oral Oral  Oral  SpO2: 93% 91% 97% 96%  Weight:      Height:        Intake/Output Summary (Last 24 hours) at 11/25/2017 1229 Last data filed at 11/25/2017 0900 Gross per 24 hour  Intake 100 ml  Output 550 ml  Net -450 ml   Filed Weights   11/24/17 0600  Weight: 105.7 kg    Examination:  Gen: Awake, Alert, Oriented X 3, elderly obese chronically ill male HEENT: PERRLA, Neck supple, + JVD Lungs: crackles in lower lungs CVS: S1S2/RRR Abd: soft, Non tender, non distended, BS present Extremities: 1 plus edema Skin: no new rashes Psychiatry: Judgement and insight appear normal. Mood & affect appropriate.     Data Reviewed:   CBC: Recent Labs  Lab 11/24/17 0041 11/25/17 0302  WBC 3.9* 3.9*  HGB 11.3* 10.4*  HCT 39.0 36.9*  MCV 107.1* 107.3*  PLT 157 329   Basic Metabolic Panel: Recent Labs  Lab 11/24/17 0041 11/25/17 0302  NA 144 141  K 3.2* 3.7  CL 98 98  CO2 35* 34*  GLUCOSE 131* 107*  BUN 32* 33*  CREATININE 2.46* 2.31*  CALCIUM 9.4 8.8*   GFR: Estimated Creatinine Clearance: 30.5 mL/min (A) (by C-G formula based on SCr of 2.31 mg/dL (H)). Liver Function Tests: No results for input(s): AST, ALT, ALKPHOS, BILITOT, PROT, ALBUMIN in the last 168 hours. No results for input(s):  LIPASE, AMYLASE in the last 168 hours. No results for input(s): AMMONIA in the last 168 hours. Coagulation Profile: Recent Labs  Lab 11/24/17 0147 11/25/17 0302  INR 1.88 1.83   Cardiac Enzymes: Recent Labs  Lab 11/24/17 0546  CKTOTAL 287  CKMB 6.0*  TROPONINI 0.08*   BNP (last 3 results) No results for input(s): PROBNP in the last 8760 hours. HbA1C: No results for input(s): HGBA1C in the last 72 hours. CBG: Recent Labs  Lab 11/24/17 1154 11/24/17 1607 11/24/17 2147 11/25/17 0806 11/25/17 1128  GLUCAP 85 93 82 79 87   Lipid Profile: No results for input(s): CHOL, HDL, LDLCALC, TRIG, CHOLHDL, LDLDIRECT in the last 72 hours. Thyroid Function Tests: No results for input(s): TSH, T4TOTAL, FREET4, T3FREE, THYROIDAB in the last 72 hours. Anemia Panel: No results for input(s): VITAMINB12, FOLATE, FERRITIN, TIBC, IRON, RETICCTPCT in the last 72 hours. Urine analysis:    Component Value Date/Time   COLORURINE YELLOW 10/27/2016 0113   APPEARANCEUR CLEAR 10/27/2016 0113   LABSPEC 1.013 10/27/2016 0113   PHURINE 6.0 10/27/2016 0113   GLUCOSEU NEGATIVE 10/27/2016 0113   HGBUR NEGATIVE 10/27/2016 0113   BILIRUBINUR NEGATIVE 10/27/2016 0113   KETONESUR NEGATIVE 10/27/2016 0113   PROTEINUR 100 (A) 10/27/2016 0113   UROBILINOGEN 0.2 06/17/2012 0622   NITRITE NEGATIVE 10/27/2016 0113   LEUKOCYTESUR NEGATIVE 10/27/2016 0113   Sepsis Labs: @LABRCNTIP (procalcitonin:4,lacticidven:4)  ) Recent Results (from the past 240 hour(s))  Respiratory Panel by PCR     Status: None   Collection Time: 11/24/17  5:25 AM  Result Value Ref Range Status   Adenovirus NOT DETECTED NOT DETECTED Final   Coronavirus 229E NOT DETECTED NOT DETECTED Final   Coronavirus HKU1 NOT DETECTED NOT DETECTED Final   Coronavirus NL63 NOT DETECTED NOT DETECTED Final   Coronavirus OC43 NOT DETECTED NOT DETECTED Final   Metapneumovirus NOT DETECTED NOT DETECTED Final   Rhinovirus / Enterovirus NOT DETECTED  NOT DETECTED Final   Influenza A NOT DETECTED NOT DETECTED Final   Influenza B NOT DETECTED NOT DETECTED Final   Parainfluenza Virus 1 NOT DETECTED NOT DETECTED Final   Parainfluenza Virus 2 NOT DETECTED NOT DETECTED Final   Parainfluenza Virus 3 NOT DETECTED NOT DETECTED Final   Parainfluenza Virus 4 NOT DETECTED NOT DETECTED Final   Respiratory Syncytial Virus NOT DETECTED NOT DETECTED Final   Bordetella pertussis NOT DETECTED NOT DETECTED Final   Chlamydophila pneumoniae NOT DETECTED NOT DETECTED Final   Mycoplasma pneumoniae NOT DETECTED NOT DETECTED Final    Comment: Performed at [facility], [place]. [address] [place], [place] [address]         Radiology Studies: Dg Chest 2 View  Result Date: 11/24/2017 CLINICAL DATA:  CHF with feeling of fluid buildup and increased dyspnea. EXAM: CHEST - 2 VIEW COMPARISON:  Priors dating back through 11/06/2016. FINDINGS: Chronic obscuration of the right costophrenic angle and portions of the right hemidiaphragm from presumed pulmonary consolidation and effusion are redemonstrated. Slight interval increase in size of the consolidation and/or fluid is seen at the right lung base with slight increase  in interstitial edema. Stable cardiomegaly with moderate aortic atherosclerosis. Acute osseous abnormality. IMPRESSION: Slight interval increase in pulmonary consolidation and effusion at the right base with interval increase in interstitial edema, suggesting slight interval worsening of pulmonary edema. Cardiomegaly with aortic atherosclerosis is stable. Electronically Signed   By: Ashley Royalty M.D.   On: 11/24/2017 01:14        Scheduled Meds: . calcitRIOL  0.25 mcg Oral Daily  . cefdinir  300 mg Oral Q12H  . furosemide  40 mg Intravenous BID  . insulin aspart  0-5 Units Subcutaneous QHS  . insulin aspart  0-9 Units Subcutaneous TID WC  . levothyroxine  175 mcg Oral QAC breakfast  . mouth rinse  15 mL Mouth Rinse BID  . multivitamin with  minerals  1 tablet Oral Daily  . pantoprazole  40 mg Oral Daily  . potassium chloride  40 mEq Oral BID  . rosuvastatin  20 mg Oral Daily  . sodium chloride flush  3 mL Intravenous Q12H  . umeclidinium-vilanterol  1 puff Inhalation Daily  . warfarin  5 mg Oral ONCE-1800  . Warfarin - Pharmacist Dosing Inpatient   Does not apply q1800   Continuous Infusions: . sodium chloride Stopped (11/24/17 0700)     LOS: 1 day    Time spent: 63min    Domenic Polite, MD [facility] Page via [email or web address], password [email or web address] After 7PM please contact night-coverage  11/25/2017, 12:29 PM

## 2017-11-25 NOTE — Progress Notes (Addendum)
ANTICOAGULATION and ANTIBIOTIC CONSULT NOTE - Initial Consult  Pharmacy Consult for Coumadin Indication: Afib  No Known Allergies  Patient Measurements: Height: 5' 7" (170.2 cm) Weight: 233 lb (105.7 kg) IBW/kg (Calculated) : 66.1  Vital Signs: Temp: 97.9 F (36.6 C) (10/21 0809) Temp Source: Oral (10/21 0809) BP: 119/80 (10/21 0809) Pulse Rate: 80 (10/21 0809)  Labs: Recent Labs    11/24/17 0041 11/24/17 0147 11/24/17 0546 11/25/17 0302  HGB 11.3*  --   --  10.4*  HCT 39.0  --   --  36.9*  PLT 157  --   --  150  LABPROT  --  21.4*  --  20.9*  INR  --  1.88  --  1.83  CREATININE 2.46*  --   --  2.31*  CKTOTAL  --   --  287  --   CKMB  --   --  6.0*  --   TROPONINI  --   --  0.08*  --     Estimated Creatinine Clearance: 30.5 mL/min (A) (by C-G formula based on SCr of 2.31 mg/dL (H)).   Medical History: Past Medical History:  Diagnosis Date  . Acute kidney injury superimposed on chronic kidney disease (HCC) 11/2016  . Acute respiratory failure (HCC) 11/2016  . Allergy   . Asthma   . Atrial fibrillation (HCC)   . 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 Benign essential hypertension   . CHF (congestive heart failure) (HCC)   . CKD (chronic kidney disease), stage III (HCC)   . COPD (chronic obstructive pulmonary disease) (HCC)   . Diabetes (HCC)   . Diabetes mellitus without complication (HCC)   . Dyspnea   . Gastritis   . GERD (gastroesophageal reflux disease)   . Gout attack 09/2012  . Hypertension   . Sinusitis, maxillary, chronic   . Thyroid disease   . Type II or unspecified type diabetes mellitus without mention of complication, not stated as uncontrolled     Medications:  Medications Prior to Admission  Medication Sig Dispense Refill Last Dose  . acetaminophen (TYLENOL) 325 MG tablet Take 650 mg by mouth every 6 (six) hours as needed for mild pain.   Past Week at Unknown time  . albuterol (PROVENTIL) (2.5 MG/3ML) 0.083% nebulizer  solution INHALE CONTENTS OF 1 VIAL IN NEBULIZER EVERY 4 HOURS AS NEEDED FOR WHEEZING AND ASTHMA (Patient taking differently: Take 2.5 mg by nebulization every 4 (four) hours as needed for wheezing. ) 75 mL 4 11/23/2017 at Unknown time  . allopurinol (ZYLOPRIM) 300 MG tablet TAKE 1 TABLET BY MOUTH EVERY DAY (Patient taking differently: Take 300 mg by mouth daily. ) 30 tablet 6 11/23/2017 at Unknown time  . calcitRIOL (ROCALTROL) 0.25 MCG capsule Take 0.25 mcg by mouth daily.    11/23/2017 at Unknown time  . isosorbide mononitrate (IMDUR) 30 MG 24 hr tablet Take 1 tablet (30 mg total) by mouth daily. 30 tablet 3 11/23/2017 at Unknown time  . KLOR-CON M20 20 MEQ tablet TAKE 1 TABLET BY MOUTH EVERY DAY (Patient taking differently: Take 20 mEq by mouth daily. ) 30 tablet 1 11/23/2017 at Unknown time  . levothyroxine (SYNTHROID, LEVOTHROID) 175 MCG tablet TAKE 1 TABLET EVERY DAY BEFORE BREAKFAST (Patient taking differently: Take 175 mcg by mouth daily before breakfast. ) 30 tablet 3 11/23/2017 at Unknown time  . Multiple Vitamins-Minerals (CENTRUM SILVER ADULT 50+ PO) Take 1 tablet by mouth daily.   11/23/2017 at Unknown time  .   pantoprazole (PROTONIX) 40 MG tablet TAKE 1 TABLET BY MOUTH EVERY DAY BEFORE SUPPER (Patient taking differently: Take 40 mg by mouth daily. ) 90 tablet 0 11/23/2017 at Unknown time  . rosuvastatin (CRESTOR) 20 MG tablet Take 1 tablet (20 mg total) by mouth daily. 30 tablet 3 11/23/2017 at Unknown time  . torsemide (DEMADEX) 20 MG tablet TAKE 1 TABLET TWICE A DAY (Patient taking differently: Take 20 mg by mouth 2 (two) times daily. ) 60 tablet 3 11/23/2017 at Unknown time  . umeclidinium-vilanterol (ANORO ELLIPTA) 62.5-25 MCG/INH AEPB Inhale 1 puff into the lungs daily. 30 each 11 11/23/2017 at Unknown time  . warfarin (COUMADIN) 5 MG tablet TAKE 1/2 TO 1 TABLET BY MOUTH DAILY AS DIRECTED BY COUMADIN CLINIC (Patient taking differently: Take 5 mg by mouth every evening. ) 30 tablet 0  11/23/2017 at 2130  . SUPREP BOWEL PREP KIT 17.5-3.13-1.6 GM/177ML SOLN Take 1 kit by mouth as directed. For colonoscopy prep (Patient not taking: Reported on 11/24/2017) 2 Bottle 0 Completed Course at Unknown time  . UNABLE TO FIND CPAP  Unknown DME- prescribed by doc in [place]   Taking   Scheduled:  . calcitRIOL  0.25 mcg Oral Daily  . furosemide  40 mg Intravenous BID  . insulin aspart  0-5 Units Subcutaneous QHS  . insulin aspart  0-9 Units Subcutaneous TID WC  . levothyroxine  175 mcg Oral QAC breakfast  . mouth rinse  15 mL Mouth Rinse BID  . multivitamin with minerals  1 tablet Oral Daily  . pantoprazole  40 mg Oral Daily  . potassium chloride  40 mEq Oral BID  . rosuvastatin  20 mg Oral Daily  . sodium chloride flush  3 mL Intravenous Q12H  . umeclidinium-vilanterol  1 puff Inhalation Daily  . Warfarin - Pharmacist Dosing Inpatient   Does not apply q1800   Infusions:  . sodium chloride Stopped (11/24/17 0700)  . ceFEPime (MAXIPIME) IV 1 g (11/25/17 0628)    Assessment: 77yo male c/o worsening SOB over 2wk, admitted for PNA and IV ABX, to continue Coumadin for Afib; INR still came back slightly low this AM. We will repeat higher dose today.   Goal of Therapy:  INR 2-3   Plan:   Coumadin 5mg PO x1 Daily INR  Minh Pham, PharmD, BCIDP, AAHIVP, CPP Infectious Disease Pharmacist Pager: [phone] 11/25/2017 8:49 AM  Addendum:  MD is ok with changing cefepime to cefdinir x 5d. CrCr~30 ml/min  Cefdinir 300mg PO BID x5d  Minh Pham, PharmD, BCIDP, AAHIVP, CPP Infectious Disease Pharmacist Pager: [phone] 11/25/2017 11:44 AM      

## 2017-11-25 NOTE — Progress Notes (Signed)
Pt asleep when RT entered room. Woke pt up to see if he was interested in wearing CPAP while admitted. Pt shook his head no. Advised pt to notify for RT if he changed his mind. Pt nodded okay. RT will continue to monitor.

## 2017-11-25 NOTE — Care Management Note (Addendum)
Case Management Note  Patient Details  Name: Omar Lambert MRN: [id number] Date of Birth: [birth year]  Subjective/Objective:  From home, presents with acute hypoxic resp failure, a/c chf, pulm htn, cope, ckd3, paf (coumadin pta), plan to dc in 24 to 48 hrs per MD note.   He will need HHRN for disease management , wife chose [facility] from [employer].  Referral given to [place] with [facility], soc will begin 24-48 hrs post dc.  Patient also will need a new cpap machine, NCM contacted [employer] who patient has cpap with, they state they will call me back with information they need and that MD needs to note that he is benefitting from the cpap that he has. Rep states she will be faxing me some information for MD to fill out and sign, then will have to go thru other depts to see if he can get another cpap.   10/22 Tomi Bamberger RN, BSN - NCM spoke with Bebi at [facility], fax [phone],  She states they need the cpap setting and MD notes to be sent at one time, patient did not wear the cpap last pm but will wear it tonight, so tomorrow NCM will fax the settings with the MD notes.     10/25 Tomi Bamberger RN, BSN - NCM did receive the order from [facility] for MD to sign and date, this was done and refaxed back on 10/24. NCM was told everything is looking good and now the QC dept is reviewing to make sure everything is ok from Medicare standpoint, they will call NCM when cpap has been approved and most likely when approved it will be delivered to his home next week around Thursday or so.  [facility] added HHPT to [facility] services.  He will be here over weekend, had co2 retention.   Contact at [facility] is [employer] [phone], will need to contact him to see if cpap has been approved.              Action/Plan: NCM will follow for transition of care needs.  Expected Discharge Date:  11/27/17               Expected Discharge Plan:     In-House Referral:     Discharge planning Services  CM  Consult  Post Acute Care Choice:    Choice offered to:     DME Arranged:    DME Agency:     HH Arranged:   RN,PT  [place] Agency:   [facility]  Status of Service:  In process, will continue to follow  If discussed at Long Length of Stay Meetings, dates discussed:    Additional Comments:  Omar Mayo, RN 11/25/2017, 9:49 AM

## 2017-11-26 LAB — HEMOGLOBIN A1C
HEMOGLOBIN A1C: 6.2 % — AB (ref 4.8–5.6)
MEAN PLASMA GLUCOSE: 131.24 mg/dL

## 2017-11-26 LAB — BASIC METABOLIC PANEL
ANION GAP: 7 (ref 5–15)
BUN: 34 mg/dL — AB (ref 8–23)
CHLORIDE: 99 mmol/L (ref 98–111)
CO2: 35 mmol/L — ABNORMAL HIGH (ref 22–32)
Calcium: 9 mg/dL (ref 8.9–10.3)
Creatinine, Ser: 2.51 mg/dL — ABNORMAL HIGH (ref 0.61–1.24)
GFR, EST AFRICAN AMERICAN: 27 mL/min — AB (ref >60)
GFR, EST NON AFRICAN AMERICAN: 23 mL/min — AB (ref >60)
Glucose, Bld: 87 mg/dL (ref 70–99)
POTASSIUM: 4.7 mmol/L (ref 3.5–5.1)
SODIUM: 141 mmol/L (ref 135–145)

## 2017-11-26 LAB — PROTIME-INR
INR: 1.68
Prothrombin Time: 19.6 seconds — ABNORMAL HIGH (ref 11.4–15.2)

## 2017-11-26 LAB — GLUCOSE, CAPILLARY
GLUCOSE-CAPILLARY: 92 mg/dL (ref 70–99)
GLUCOSE-CAPILLARY: 91 mg/dL (ref 70–99)
GLUCOSE-CAPILLARY: 89 mg/dL (ref 70–99)
GLUCOSE-CAPILLARY: 105 mg/dL — AB (ref 70–99)

## 2017-11-26 MED ORDER — SODIUM CHLORIDE 0.9 % IV SOLN
INTRAVENOUS | Status: DC
  Administered 2017-11-26: 10:00:00 via INTRAVENOUS

## 2017-11-26 MED ORDER — WARFARIN SODIUM 7.5 MG PO TABS
7.5000 mg | ORAL_TABLET | Freq: Once | ORAL | Status: AC
  Administered 2017-11-26: 7.5 mg via ORAL
  Filled 2017-11-26: qty 1

## 2017-11-26 NOTE — Discharge Instructions (Signed)

## 2017-11-26 NOTE — Progress Notes (Signed)
PROGRESS NOTE    Omar Lambert  IEP:[id number] DOB: [birth year] DOA: 11/24/2017 PCP: Gayland Curry, DO  Brief Narrative: 78 year old male with history of type 2 diabetes mellitus, COPD, hypothyroidism, hypertension, chronic kidney disease stage III, paroxysmal atrial fibrillation, diabetes mellitus type 2 presented to the emergency room with progressive dyspnea on exertion, productive cough, orthopnea, lower extremity edema for 2 weeks. -He denies fevers but temp was 100.1 in the emergency room, chest x-ray in the ED noted ? consolidation of the right base with increased interstitial edema, worsening pulmonary edema  Assessment & Plan:   Acute hypoxic respiratory failure -Primarily secondary to acute on chronic diastolic CHF and pulmonary hypertension -Improving but remains volume overloaded with 2+ edema, JVD, abdominal wall edema and crackles on lung exam, remains hypoxic requiring 2 L O2 -Has underlying COPD -Has chronic scarring at the right base, clinically low suspicion for pneumonia -hence stopped Vancomycin, changed Cefepime to Ceftin for 5days -Continue IV Lasix, dose increased to 80mg  BID -I/O inaccurate, d/w staff -Supplement KCL, monitor Bmet  Acute on chronic diastolic CHF/Pulm HTN -Last echo 7/201 9 with EF of 50%,, moderate pulmonary hypertension PA pressures of 47 mmHg -as above  COPD -Chronic respiratory failure -No wheezing at this time, continue nebs  OSA -continue CPAP QHS -pt was using and benefiting from his CPAP at home but needs a new one as the old one keeps cutting off  -CM to FU on this  Chronic kidney disease stage 4 -baseline creatinine 2.3-2.5 -Monitor with diuresis, stable and close to baseline  Diabetes mellitus -Likely diet controlled, not on medications for this -Check hemoglobin A1c, continue sliding scale insulin  Elevated troponin -This is mild with a flat trend, no evidence of ACS  Paroxysmal atrial fibrillation -Rate controlled,  continue Coumadin  Hypothyroidism -Continue Synthroid  DVT prophylaxis: On warfarin Code Status: Full code Family Communication: wife at bedside Disposition Plan: Home in 2-3 pending adequate diuresis  Consultants:    Procedures:   Antimicrobials:    Subjective: -continues to feel better, breathing slowly improving, some abd itching  Objective: Vitals:   11/25/17 1722 11/25/17 2324 11/26/17 0733 11/26/17 0749  BP: (!) 148/84 121/69 128/84   Pulse: 83 80 72   Resp: 16 18    Temp: 97.7 F (36.5 C) 98.4 F (36.9 C) 97.9 F (36.6 C)   TempSrc: Oral Oral Oral   SpO2: 97% 100% 96% 97%  Weight:      Height:        Intake/Output Summary (Last 24 hours) at 11/26/2017 1318 Last data filed at 11/26/2017 1050 Gross per 24 hour  Intake 1040 ml  Output 1150 ml  Net -110 ml   Filed Weights   11/24/17 0600  Weight: 105.7 kg    Examination:  Gen: Awake, Alert, Oriented X 3, obese, elderly male, sitting in bed, no distress HEENT: PERRLA, Neck supple, + JVD Lungs: bibasilar crackles CVS: S1S2/RRR Abd: soft, Non tender, non distended, BS present Extremities: 2plus edema Skin: no new rashes Psychiatry: Judgement and insight appear normal. Mood & affect appropriate.     Data Reviewed:   CBC: Recent Labs  Lab 11/24/17 0041 11/25/17 0302  WBC 3.9* 3.9*  HGB 11.3* 10.4*  HCT 39.0 36.9*  MCV 107.1* 107.3*  PLT 157 660   Basic Metabolic Panel: Recent Labs  Lab 11/24/17 0041 11/25/17 0302 11/26/17 0812  NA 144 141 141  K 3.2* 3.7 4.7  CL 98 98 99  CO2 35* 34* 35*  GLUCOSE 131* 107* 87  BUN 32* 33* 34*  CREATININE 2.46* 2.31* 2.51*  CALCIUM 9.4 8.8* 9.0   GFR: Estimated Creatinine Clearance: 28.1 mL/min (A) (by C-G formula based on SCr of 2.51 mg/dL (H)). Liver Function Tests: No results for input(s): AST, ALT, ALKPHOS, BILITOT, PROT, ALBUMIN in the last 168 hours. No results for input(s): LIPASE, AMYLASE in the last 168 hours. No results for  input(s): AMMONIA in the last 168 hours. Coagulation Profile: Recent Labs  Lab 11/24/17 0147 11/25/17 0302 11/26/17 0217  INR 1.88 1.83 1.68   Cardiac Enzymes: Recent Labs  Lab 11/24/17 0546  CKTOTAL 287  CKMB 6.0*  TROPONINI 0.08*   BNP (last 3 results) No results for input(s): PROBNP in the last 8760 hours. HbA1C: No results for input(s): HGBA1C in the last 72 hours. CBG: Recent Labs  Lab 11/25/17 0806 11/25/17 1128 11/25/17 1722 11/25/17 2214 11/26/17 0733  GLUCAP 79 87 96 125* 92   Lipid Profile: No results for input(s): CHOL, HDL, LDLCALC, TRIG, CHOLHDL, LDLDIRECT in the last 72 hours. Thyroid Function Tests: No results for input(s): TSH, T4TOTAL, FREET4, T3FREE, THYROIDAB in the last 72 hours. Anemia Panel: No results for input(s): VITAMINB12, FOLATE, FERRITIN, TIBC, IRON, RETICCTPCT in the last 72 hours. Urine analysis:    Component Value Date/Time   COLORURINE YELLOW 10/27/2016 0113   APPEARANCEUR CLEAR 10/27/2016 0113   LABSPEC 1.013 10/27/2016 0113   PHURINE 6.0 10/27/2016 0113   GLUCOSEU NEGATIVE 10/27/2016 0113   HGBUR NEGATIVE 10/27/2016 0113   BILIRUBINUR NEGATIVE 10/27/2016 0113   KETONESUR NEGATIVE 10/27/2016 0113   PROTEINUR 100 (A) 10/27/2016 0113   UROBILINOGEN 0.2 06/17/2012 0622   NITRITE NEGATIVE 10/27/2016 0113   LEUKOCYTESUR NEGATIVE 10/27/2016 0113   Sepsis Labs: @LABRCNTIP (procalcitonin:4,lacticidven:4)  ) Recent Results (from the past 240 hour(s))  Respiratory Panel by PCR     Status: None   Collection Time: 11/24/17  5:25 AM  Result Value Ref Range Status   Adenovirus NOT DETECTED NOT DETECTED Final   Coronavirus 229E NOT DETECTED NOT DETECTED Final   Coronavirus HKU1 NOT DETECTED NOT DETECTED Final   Coronavirus NL63 NOT DETECTED NOT DETECTED Final   Coronavirus OC43 NOT DETECTED NOT DETECTED Final   Metapneumovirus NOT DETECTED NOT DETECTED Final   Rhinovirus / Enterovirus NOT DETECTED NOT DETECTED Final   Influenza A  NOT DETECTED NOT DETECTED Final   Influenza B NOT DETECTED NOT DETECTED Final   Parainfluenza Virus 1 NOT DETECTED NOT DETECTED Final   Parainfluenza Virus 2 NOT DETECTED NOT DETECTED Final   Parainfluenza Virus 3 NOT DETECTED NOT DETECTED Final   Parainfluenza Virus 4 NOT DETECTED NOT DETECTED Final   Respiratory Syncytial Virus NOT DETECTED NOT DETECTED Final   Bordetella pertussis NOT DETECTED NOT DETECTED Final   Chlamydophila pneumoniae NOT DETECTED NOT DETECTED Final   Mycoplasma pneumoniae NOT DETECTED NOT DETECTED Final    Comment: Performed at [facility], [place]. [address] [place],  [address]         Radiology Studies: No results found.      Scheduled Meds: . calcitRIOL  0.25 mcg Oral Daily  . cefdinir  300 mg Oral Q12H  . furosemide  80 mg Intravenous BID  . insulin aspart  0-5 Units Subcutaneous QHS  . insulin aspart  0-9 Units Subcutaneous TID WC  . levothyroxine  175 mcg Oral QAC breakfast  . mouth rinse  15 mL Mouth Rinse BID  . multivitamin with minerals  1 tablet  Oral Daily  . pantoprazole  40 mg Oral Daily  . potassium chloride  40 mEq Oral BID  . rosuvastatin  20 mg Oral Daily  . sodium chloride flush  3 mL Intravenous Q12H  . umeclidinium-vilanterol  1 puff Inhalation Daily  . warfarin  7.5 mg Oral ONCE-1800  . Warfarin - Pharmacist Dosing Inpatient   Does not apply q1800   Continuous Infusions: . sodium chloride Stopped (11/24/17 0700)     LOS: 2 days    Time spent: 60min    Domenic Polite, MD [facility] Page via [email or web address], password [email or web address] After 7PM please contact night-coverage  11/26/2017, 1:18 PM

## 2017-11-26 NOTE — Progress Notes (Signed)
ANTICOAGULATION and ANTIBIOTIC CONSULT NOTE - Initial Consult  Pharmacy Consult for Coumadin Indication: Afib  No Known Allergies  Patient Measurements: Height: '5\' 7"'  (170.2 cm) Weight: 233 lb (105.7 kg) IBW/kg (Calculated) : 66.1  Vital Signs: Temp: 97.9 F (36.6 C) (10/22 0733) Temp Source: Oral (10/22 0733) BP: 128/84 (10/22 0733) Pulse Rate: 72 (10/22 0733)  Labs: Recent Labs    11/24/17 0041 11/24/17 0147 11/24/17 0546 11/25/17 0302 11/26/17 0217  HGB 11.3*  --   --  10.4*  --   HCT 39.0  --   --  36.9*  --   PLT 157  --   --  150  --   LABPROT  --  21.4*  --  20.9* 19.6*  INR  --  1.88  --  1.83 1.68  CREATININE 2.46*  --   --  2.31*  --   CKTOTAL  --   --  287  --   --   CKMB  --   --  6.0*  --   --   TROPONINI  --   --  0.08*  --   --     Estimated Creatinine Clearance: 30.5 mL/min (A) (by C-G formula based on SCr of 2.31 mg/dL (H)).   Medical History: Past Medical History:  Diagnosis Date  . Acute kidney injury superimposed on chronic kidney disease ([place]) 11/2016  . Acute respiratory failure ([place]) 11/2016  . Allergy   . Asthma   . Atrial fibrillation ([place])   . 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 Benign essential hypertension   . CHF (congestive heart failure) ([place])   . CKD (chronic kidney disease), stage III ([place])   . COPD (chronic obstructive pulmonary disease) ([place])   . Diabetes ([place])   . Diabetes mellitus without complication ([place])   . Dyspnea   . Gastritis   . GERD (gastroesophageal reflux disease)   . Gout attack 09/2012  . Hypertension   . Sinusitis, maxillary, chronic   . Thyroid disease   . Type II or unspecified type diabetes mellitus without mention of complication, not stated as uncontrolled     Medications:  Medications Prior to Admission  Medication Sig Dispense Refill Last Dose  . acetaminophen (TYLENOL) 325 MG tablet Take 650 mg by mouth every 6 (six) hours as needed for mild pain.   Past Week at Unknown  time  . albuterol (PROVENTIL) (2.5 MG/3ML) 0.083% nebulizer solution INHALE CONTENTS OF 1 VIAL IN NEBULIZER EVERY 4 HOURS AS NEEDED FOR WHEEZING AND ASTHMA (Patient taking differently: Take 2.5 mg by nebulization every 4 (four) hours as needed for wheezing. ) 75 mL 4 11/23/2017 at Unknown time  . allopurinol (ZYLOPRIM) 300 MG tablet TAKE 1 TABLET BY MOUTH EVERY DAY (Patient taking differently: Take 300 mg by mouth daily. ) 30 tablet 6 11/23/2017 at Unknown time  . calcitRIOL (ROCALTROL) 0.25 MCG capsule Take 0.25 mcg by mouth daily.    11/23/2017 at Unknown time  . isosorbide mononitrate (IMDUR) 30 MG 24 hr tablet Take 1 tablet (30 mg total) by mouth daily. 30 tablet 3 11/23/2017 at Unknown time  . KLOR-CON M20 20 MEQ tablet TAKE 1 TABLET BY MOUTH EVERY DAY (Patient taking differently: Take 20 mEq by mouth daily. ) 30 tablet 1 11/23/2017 at Unknown time  . levothyroxine (SYNTHROID, LEVOTHROID) 175 MCG tablet TAKE 1 TABLET EVERY DAY BEFORE BREAKFAST (Patient taking differently: Take 175 mcg by mouth daily before breakfast. ) 30 tablet 3 11/23/2017  at Unknown time  . Multiple Vitamins-Minerals (CENTRUM SILVER ADULT 50+ PO) Take 1 tablet by mouth daily.   11/23/2017 at Unknown time  . pantoprazole (PROTONIX) 40 MG tablet TAKE 1 TABLET BY MOUTH EVERY DAY BEFORE SUPPER (Patient taking differently: Take 40 mg by mouth daily. ) 90 tablet 0 11/23/2017 at Unknown time  . rosuvastatin (CRESTOR) 20 MG tablet Take 1 tablet (20 mg total) by mouth daily. 30 tablet 3 11/23/2017 at Unknown time  . torsemide (DEMADEX) 20 MG tablet TAKE 1 TABLET TWICE A DAY (Patient taking differently: Take 20 mg by mouth 2 (two) times daily. ) 60 tablet 3 11/23/2017 at Unknown time  . umeclidinium-vilanterol (ANORO ELLIPTA) 62.5-25 MCG/INH AEPB Inhale 1 puff into the lungs daily. 30 each 11 11/23/2017 at Unknown time  . warfarin (COUMADIN) 5 MG tablet TAKE 1/2 TO 1 TABLET BY MOUTH DAILY AS DIRECTED BY COUMADIN CLINIC (Patient taking  differently: Take 5 mg by mouth every evening. ) 30 tablet 0 11/23/2017 at 2130  . SUPREP BOWEL PREP KIT 17.5-3.13-1.6 GM/177ML SOLN Take 1 kit by mouth as directed. For colonoscopy prep (Patient not taking: Reported on 11/24/2017) 2 Bottle 0 Completed Course at Unknown time  . UNABLE TO FIND CPAP  Unknown DME- prescribed by doc in [facility]   Taking   Scheduled:  . calcitRIOL  0.25 mcg Oral Daily  . cefdinir  300 mg Oral Q12H  . furosemide  80 mg Intravenous BID  . insulin aspart  0-5 Units Subcutaneous QHS  . insulin aspart  0-9 Units Subcutaneous TID WC  . levothyroxine  175 mcg Oral QAC breakfast  . mouth rinse  15 mL Mouth Rinse BID  . multivitamin with minerals  1 tablet Oral Daily  . pantoprazole  40 mg Oral Daily  . potassium chloride  40 mEq Oral BID  . rosuvastatin  20 mg Oral Daily  . sodium chloride flush  3 mL Intravenous Q12H  . umeclidinium-vilanterol  1 puff Inhalation Daily  . Warfarin - Pharmacist Dosing Inpatient   Does not apply q1800   Infusions:  . sodium chloride Stopped (11/24/17 0700)  . sodium chloride      Assessment: 77yo male c/o worsening SOB over 2wk, admitted for PNA and IV ABX, to continue Coumadin for Afib; INR dropped slightly today. We will give a higher dose.    Goal of Therapy:  INR 2-3   Plan:   Coumadin 7.36m PO x1 Daily INR  MOnnie Boer PharmD, [place] CPP Infectious Disease Pharmacist Pager: [phone]/22/2019 8:32 AM

## 2017-11-27 DIAGNOSIS — L8990 Pressure ulcer of unspecified site, unspecified stage: Secondary | ICD-10-CM

## 2017-11-27 LAB — BASIC METABOLIC PANEL
Anion gap: 11 (ref 5–15)
BUN: 39 mg/dL — AB (ref 8–23)
CO2: 34 mmol/L — ABNORMAL HIGH (ref 22–32)
CREATININE: 2.63 mg/dL — AB (ref 0.61–1.24)
Calcium: 9.4 mg/dL (ref 8.9–10.3)
Chloride: 95 mmol/L — ABNORMAL LOW (ref 98–111)
GFR calc Af Amer: 25 mL/min — ABNORMAL LOW (ref >60)
GFR, EST NON AFRICAN AMERICAN: 22 mL/min — AB (ref >60)
Glucose, Bld: 90 mg/dL (ref 70–99)
POTASSIUM: 4.8 mmol/L (ref 3.5–5.1)
SODIUM: 140 mmol/L (ref 135–145)

## 2017-11-27 LAB — GLUCOSE, CAPILLARY
GLUCOSE-CAPILLARY: 94 mg/dL (ref 70–99)
Glucose-Capillary: 95 mg/dL (ref 70–99)
Glucose-Capillary: 122 mg/dL — ABNORMAL HIGH (ref 70–99)
Glucose-Capillary: 114 mg/dL — ABNORMAL HIGH (ref 70–99)

## 2017-11-27 LAB — PROTIME-INR
INR: 1.73
PROTHROMBIN TIME: 20 s — AB (ref 11.4–15.2)

## 2017-11-27 LAB — MAGNESIUM: Magnesium: 2.3 mg/dL (ref 1.7–2.4)

## 2017-11-27 MED ORDER — ACETAMINOPHEN 325 MG PO TABS
650.0000 mg | ORAL_TABLET | Freq: Four times a day (QID) | ORAL | Status: DC | PRN
  Administered 2017-11-27 – 2017-12-04 (×5): 650 mg via ORAL
  Filled 2017-11-27 (×5): qty 2

## 2017-11-27 MED ORDER — WARFARIN SODIUM 5 MG PO TABS
5.0000 mg | ORAL_TABLET | Freq: Once | ORAL | Status: AC
  Administered 2017-11-27: 5 mg via ORAL
  Filled 2017-11-27: qty 1

## 2017-11-27 MED ORDER — METOPROLOL SUCCINATE ER 25 MG PO TB24
12.5000 mg | ORAL_TABLET | Freq: Every day | ORAL | Status: DC
  Administered 2017-11-27 – 2017-12-02 (×7): 12.5 mg via ORAL
  Filled 2017-11-27 (×8): qty 1

## 2017-11-27 MED ORDER — FUROSEMIDE 10 MG/ML IJ SOLN
80.0000 mg | Freq: Two times a day (BID) | INTRAMUSCULAR | Status: DC
  Administered 2017-11-27: 80 mg via INTRAVENOUS
  Filled 2017-11-27 (×2): qty 8

## 2017-11-27 MED ORDER — CEFDINIR 300 MG PO CAPS
300.0000 mg | ORAL_CAPSULE | Freq: Every day | ORAL | Status: DC
  Administered 2017-11-28: 300 mg via ORAL
  Filled 2017-11-27: qty 1

## 2017-11-27 MED ORDER — TORSEMIDE 20 MG PO TABS
20.0000 mg | ORAL_TABLET | Freq: Two times a day (BID) | ORAL | Status: DC
  Administered 2017-11-27: 20 mg via ORAL
  Filled 2017-11-27: qty 1

## 2017-11-27 NOTE — Progress Notes (Signed)
ANTICOAGULATION and ANTIBIOTIC CONSULT NOTE - Follow-Up Consult  Pharmacy Consult for Coumadin Indication: Afib  No Known Allergies  Patient Measurements: Height: '5\' 7"'  (170.2 cm) Weight: 235 lb 14.3 oz (107 kg) IBW/kg (Calculated) : 66.1  Vital Signs: Temp: 98.2 F (36.8 C) (10/23 0800) Temp Source: Oral (10/23 0800) BP: 136/79 (10/23 0800) Pulse Rate: 70 (10/23 0800)  Labs: Recent Labs    11/25/17 0302 11/26/17 0217 11/26/17 0812 11/27/17 0255  HGB 10.4*  --   --   --   HCT 36.9*  --   --   --   PLT 150  --   --   --   LABPROT 20.9* 19.6*  --  20.0*  INR 1.83 1.68  --  1.73  CREATININE 2.31*  --  2.51* 2.63*    Estimated Creatinine Clearance: 27 mL/min (A) (by C-G formula based on SCr of 2.63 mg/dL (H)).   Medical History: Past Medical History:  Diagnosis Date  . Acute kidney injury superimposed on chronic kidney disease ([place]) 11/2016  . Acute respiratory failure ([place]) 11/2016  . Allergy   . Asthma   . Atrial fibrillation ([place])   . 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 Benign essential hypertension   . CHF (congestive heart failure) ([place])   . CKD (chronic kidney disease), stage III ([place])   . COPD (chronic obstructive pulmonary disease) ([place])   . Diabetes ([place])   . Diabetes mellitus without complication ([place])   . Dyspnea   . Gastritis   . GERD (gastroesophageal reflux disease)   . Gout attack 09/2012  . Hypertension   . Sinusitis, maxillary, chronic   . Thyroid disease   . Type II or unspecified type diabetes mellitus without mention of complication, not stated as uncontrolled     Medications:  Medications Prior to Admission  Medication Sig Dispense Refill Last Dose  . acetaminophen (TYLENOL) 325 MG tablet Take 650 mg by mouth every 6 (six) hours as needed for mild pain.   Past Week at Unknown time  . albuterol (PROVENTIL) (2.5 MG/3ML) 0.083% nebulizer solution INHALE CONTENTS OF 1 VIAL IN NEBULIZER EVERY 4 HOURS AS NEEDED FOR  WHEEZING AND ASTHMA (Patient taking differently: Take 2.5 mg by nebulization every 4 (four) hours as needed for wheezing. ) 75 mL 4 11/23/2017 at Unknown time  . allopurinol (ZYLOPRIM) 300 MG tablet TAKE 1 TABLET BY MOUTH EVERY DAY (Patient taking differently: Take 300 mg by mouth daily. ) 30 tablet 6 11/23/2017 at Unknown time  . calcitRIOL (ROCALTROL) 0.25 MCG capsule Take 0.25 mcg by mouth daily.    11/23/2017 at Unknown time  . isosorbide mononitrate (IMDUR) 30 MG 24 hr tablet Take 1 tablet (30 mg total) by mouth daily. 30 tablet 3 11/23/2017 at Unknown time  . KLOR-CON M20 20 MEQ tablet TAKE 1 TABLET BY MOUTH EVERY DAY (Patient taking differently: Take 20 mEq by mouth daily. ) 30 tablet 1 11/23/2017 at Unknown time  . levothyroxine (SYNTHROID, LEVOTHROID) 175 MCG tablet TAKE 1 TABLET EVERY DAY BEFORE BREAKFAST (Patient taking differently: Take 175 mcg by mouth daily before breakfast. ) 30 tablet 3 11/23/2017 at Unknown time  . Multiple Vitamins-Minerals (CENTRUM SILVER ADULT 50+ PO) Take 1 tablet by mouth daily.   11/23/2017 at Unknown time  . pantoprazole (PROTONIX) 40 MG tablet TAKE 1 TABLET BY MOUTH EVERY DAY BEFORE SUPPER (Patient taking differently: Take 40 mg by mouth daily. ) 90 tablet 0 11/23/2017 at Unknown time  .  rosuvastatin (CRESTOR) 20 MG tablet Take 1 tablet (20 mg total) by mouth daily. 30 tablet 3 11/23/2017 at Unknown time  . torsemide (DEMADEX) 20 MG tablet TAKE 1 TABLET TWICE A DAY (Patient taking differently: Take 20 mg by mouth 2 (two) times daily. ) 60 tablet 3 11/23/2017 at Unknown time  . umeclidinium-vilanterol (ANORO ELLIPTA) 62.5-25 MCG/INH AEPB Inhale 1 puff into the lungs daily. 30 each 11 11/23/2017 at Unknown time  . warfarin (COUMADIN) 5 MG tablet TAKE 1/2 TO 1 TABLET BY MOUTH DAILY AS DIRECTED BY COUMADIN CLINIC (Patient taking differently: Take 5 mg by mouth every evening. ) 30 tablet 0 11/23/2017 at 2130  . SUPREP BOWEL PREP KIT 17.5-3.13-1.6 GM/177ML SOLN Take 1  kit by mouth as directed. For colonoscopy prep (Patient not taking: Reported on 11/24/2017) 2 Bottle 0 Completed Course at Unknown time  . UNABLE TO FIND CPAP  Unknown DME- prescribed by doc in [facility]   Taking   Scheduled:  . calcitRIOL  0.25 mcg Oral Daily  . cefdinir  300 mg Oral Q12H  . furosemide  80 mg Intravenous BID  . insulin aspart  0-5 Units Subcutaneous QHS  . insulin aspart  0-9 Units Subcutaneous TID WC  . levothyroxine  175 mcg Oral QAC breakfast  . mouth rinse  15 mL Mouth Rinse BID  . multivitamin with minerals  1 tablet Oral Daily  . pantoprazole  40 mg Oral Daily  . potassium chloride  40 mEq Oral BID  . rosuvastatin  20 mg Oral Daily  . sodium chloride flush  3 mL Intravenous Q12H  . umeclidinium-vilanterol  1 puff Inhalation Daily  . Warfarin - Pharmacist Dosing Inpatient   Does not apply q1800   Infusions:  . sodium chloride Stopped (11/24/17 0700)    Assessment: 77 yo male c/o worsening SOB over 2wk, admitted for PNA and started on IV ABX.  Pt to continue Coumadin for Afib.  PTA dose = 2.70m daily except 5432mon Wed/Fri.  INR trending up after higher doses.    Goal of Therapy:  INR 2-3   Plan:   Coumadin 32m76mO x1 Daily INR  KimManpower Incharm.D., BCPS Clinical Pharmacist Pager: [phone] phone for 11/27/2017 from 8:30-4:00 is [phone]**Pharmacist phone directory can now be found on [email or web address] ([email or web address]).  Listed under [facility] [place]10/23/2019 1:32 PM

## 2017-11-27 NOTE — Consult Note (Signed)
   [facility] CM Inpatient Consult   11/27/2017  Omar Lambert Oct 21, 1940 [id number]   Patient screened for high risk for unplanned readmissions [place] [facility] services with [facility]. Patient admitted with Acute Respiratory Hypoxia, HX of but not limited to COPD, HX of HF and Diabetes.  Will follow for disposition and needs. Inpatient RNCM states patient has not been consistent with CPAP usage and to return home with CPAP machine.  Primary Care Provider:  Dr. [name]  Pharmacy:  [facility]     Please place a [place] Management consult or for questions contact:   Natividad Brood, RN BSN [facility] Liaison  [phone] business mobile phone Toll free office [phone]

## 2017-11-27 NOTE — Progress Notes (Signed)
PROGRESS NOTE    Omar Lambert  FIE:[id number] DOB: 1940/12/16 DOA: 11/24/2017 PCP: Gayland Curry, DO  Brief Narrative: 77 year old male with history of type 2 diabetes mellitus, COPD, hypothyroidism, hypertension, chronic kidney disease stage III, paroxysmal atrial fibrillation, diabetes mellitus type 2 presented to the emergency room with progressive dyspnea on exertion, productive cough, orthopnea, lower extremity edema for 2 weeks. -He denies fevers but temp was 100.1 in the emergency room, chest x-ray in the ED noted ? consolidation of the right base with increased interstitial edema, worsening pulmonary edema  Assessment & Plan:   Acute hypoxic respiratory failure -Primarily secondary to acute on chronic diastolic CHF and pulmonary hypertension -Improving but remains volume overloaded with 2+ edema, JVD, abdominal wall edema and crackles on lung exam, remains hypoxic requiring 2 L O2 -Has underlying COPD -Has chronic scarring at the right base, clinically low suspicion for pneumonia -hence stopped Vancomycin, changed Cefepime to Ceftin for 5days -Continue IV Lasix, dose increased to 80mg  BID -I/O inaccurate, d/w staff -Supplement KCL, monitor Bmet  - Unna boots  Acute on chronic diastolic CHF/Pulm HTN -Last echo 7/201 9 with EF of 50%,, moderate pulmonary hypertension PA pressures of 47 mmHg Patient's baseline weight is 101 kg, currently in the hospital 105 kg.  COPD -Chronic respiratory failure -No wheezing at this time, continue nebs  OSA -continue CPAP QHS -pt was using and benefiting from his CPAP at home but needs a new one as the old one keeps cutting off  -CM to FU on this Last CPAP study was in 2010 at which time auto CPAP was recommended.  Chronic kidney disease stage 4 -baseline creatinine 2.3-2.5 -Monitor with diuresis, stable and close to baseline Mild worsening of the renal function this morning we will continue with diuresis and add Unna boots.  Diabetes  mellitus renal complication -Likely diet controlled, not on medications for this -6.2 hemoglobin A1c, continue sliding scale insulin  Elevated troponin -This is mild with a flat trend, no evidence of ACS  Paroxysmal atrial fibrillation -Rate controlled, continue Coumadin  Hypothyroidism -Continue Synthroid  DVT prophylaxis: On warfarin Code Status: Full code Family Communication: wife at bedside Disposition Plan: Home in 2-3 pending adequate diuresis  Consultants:    Procedures:   Antimicrobials:    Subjective:  that he is ready to go home.  No nausea no vomiting no fever no chills.  Refusing to wear CPAP at night.  Objective: Vitals:   11/27/17 0303 11/27/17 0800 11/27/17 0806 11/27/17 1659  BP:  136/79  122/83  Pulse:  70  76  Resp:      Temp:  98.2 F (36.8 C)  97.6 F (36.4 C)  TempSrc:  Oral  Oral  SpO2:  97% 99% 96%  Weight: 107 kg     Height:        Intake/Output Summary (Last 24 hours) at 11/27/2017 1913 Last data filed at 11/27/2017 1660 Gross per 24 hour  Intake 360 ml  Output 1400 ml  Net -1040 ml   Filed Weights   11/24/17 0600 11/27/17 0303  Weight: 105.7 kg 107 kg    Examination:  Gen: Awake, Alert, Oriented X 3, obese, elderly male, sitting in bed, no distress HEENT: PERRLA, Neck supple, + JVD Lungs: bibasilar crackles CVS: S1S2/RRR Abd: soft, Non tender, non distended, BS present Extremities: 2plus edema Skin: no new rashes Psychiatry: Judgement and insight appear normal. Mood & affect appropriate.     Data Reviewed:   CBC: Recent Labs  Lab 11/24/17 0041 11/25/17 0302  WBC 3.9* 3.9*  HGB 11.3* 10.4*  HCT 39.0 36.9*  MCV 107.1* 107.3*  PLT 157 956   Basic Metabolic Panel: Recent Labs  Lab 11/24/17 0041 11/25/17 0302 11/26/17 0812 11/27/17 0255  NA 144 141 141 140  K 3.2* 3.7 4.7 4.8  CL 98 98 99 95*  CO2 35* 34* 35* 34*  GLUCOSE 131* 107* 87 90  BUN 32* 33* 34* 39*  CREATININE 2.46* 2.31* 2.51* 2.63*    CALCIUM 9.4 8.8* 9.0 9.4  MG  --   --   --  2.3   GFR: Estimated Creatinine Clearance: 27 mL/min (A) (by C-G formula based on SCr of 2.63 mg/dL (H)). Liver Function Tests: No results for input(s): AST, ALT, ALKPHOS, BILITOT, PROT, ALBUMIN in the last 168 hours. No results for input(s): LIPASE, AMYLASE in the last 168 hours. No results for input(s): AMMONIA in the last 168 hours. Coagulation Profile: Recent Labs  Lab 11/24/17 0147 11/25/17 0302 11/26/17 0217 11/27/17 0255  INR 1.88 1.83 1.68 1.73   Cardiac Enzymes: Recent Labs  Lab 11/24/17 0546  CKTOTAL 287  CKMB 6.0*  TROPONINI 0.08*   BNP (last 3 results) No results for input(s): PROBNP in the last 8760 hours. HbA1C: Recent Labs    11/26/17 0217  HGBA1C 6.2*   CBG: Recent Labs  Lab 11/26/17 1643 11/26/17 2127 11/27/17 0758 11/27/17 1149 11/27/17 1656  GLUCAP 89 105* 94 95 122*   Lipid Profile: No results for input(s): CHOL, HDL, LDLCALC, TRIG, CHOLHDL, LDLDIRECT in the last 72 hours. Thyroid Function Tests: No results for input(s): TSH, T4TOTAL, FREET4, T3FREE, THYROIDAB in the last 72 hours. Anemia Panel: No results for input(s): VITAMINB12, FOLATE, FERRITIN, TIBC, IRON, RETICCTPCT in the last 72 hours. Urine analysis:    Component Value Date/Time   COLORURINE YELLOW 10/27/2016 0113   APPEARANCEUR CLEAR 10/27/2016 0113   LABSPEC 1.013 10/27/2016 0113   PHURINE 6.0 10/27/2016 0113   GLUCOSEU NEGATIVE 10/27/2016 0113   HGBUR NEGATIVE 10/27/2016 0113   BILIRUBINUR NEGATIVE 10/27/2016 0113   KETONESUR NEGATIVE 10/27/2016 0113   PROTEINUR 100 (A) 10/27/2016 0113   UROBILINOGEN 0.2 06/17/2012 0622   NITRITE NEGATIVE 10/27/2016 0113   LEUKOCYTESUR NEGATIVE 10/27/2016 0113   Sepsis Labs: @LABRCNTIP (procalcitonin:4,lacticidven:4)  ) Recent Results (from the past 240 hour(s))  Respiratory Panel by PCR     Status: None   Collection Time: 11/24/17  5:25 AM  Result Value Ref Range Status   Adenovirus  NOT DETECTED NOT DETECTED Final   Coronavirus 229E NOT DETECTED NOT DETECTED Final   Coronavirus HKU1 NOT DETECTED NOT DETECTED Final   Coronavirus NL63 NOT DETECTED NOT DETECTED Final   Coronavirus OC43 NOT DETECTED NOT DETECTED Final   Metapneumovirus NOT DETECTED NOT DETECTED Final   Rhinovirus / Enterovirus NOT DETECTED NOT DETECTED Final   Influenza A NOT DETECTED NOT DETECTED Final   Influenza B NOT DETECTED NOT DETECTED Final   Parainfluenza Virus 1 NOT DETECTED NOT DETECTED Final   Parainfluenza Virus 2 NOT DETECTED NOT DETECTED Final   Parainfluenza Virus 3 NOT DETECTED NOT DETECTED Final   Parainfluenza Virus 4 NOT DETECTED NOT DETECTED Final   Respiratory Syncytial Virus NOT DETECTED NOT DETECTED Final   Bordetella pertussis NOT DETECTED NOT DETECTED Final   Chlamydophila pneumoniae NOT DETECTED NOT DETECTED Final   Mycoplasma pneumoniae NOT DETECTED NOT DETECTED Final    Comment: Performed at [facility], [place]. [address], [place], [place] [address]  Radiology Studies: No results found.      Scheduled Meds: . calcitRIOL  0.25 mcg Oral Daily  . [START ON 11/28/2017] cefdinir  300 mg Oral Daily  . furosemide  80 mg Intravenous BID  . insulin aspart  0-5 Units Subcutaneous QHS  . insulin aspart  0-9 Units Subcutaneous TID WC  . levothyroxine  175 mcg Oral QAC breakfast  . mouth rinse  15 mL Mouth Rinse BID  . metoprolol succinate  12.5 mg Oral Daily  . multivitamin with minerals  1 tablet Oral Daily  . pantoprazole  40 mg Oral Daily  . potassium chloride  40 mEq Oral BID  . rosuvastatin  20 mg Oral Daily  . sodium chloride flush  3 mL Intravenous Q12H  . umeclidinium-vilanterol  1 puff Inhalation Daily  . Warfarin - Pharmacist Dosing Inpatient   Does not apply q1800   Continuous Infusions: . sodium chloride Stopped (11/24/17 0700)     LOS: 3 days    Time spent: 25min    Xzaria Teo  [facility] Page via [email or web address], password  [email or web address] After 7PM please contact night-coverage  11/27/2017, 7:13 PM

## 2017-11-27 NOTE — Progress Notes (Signed)
Orthopedic Tech Progress Note Patient Details:  Omar Lambert Sep 14, 1940 [id number] Bi-lat Unna boots was was perform on pt did well with it. Patient ID: Omar Lambert, male   DOB: [birth year], 77 y.o.   MRN: [id number]   Ladell Pier [facility] 11/27/2017, 12:28 PM

## 2017-11-27 NOTE — Progress Notes (Signed)
RT offered to place pt on dreamstation. Pt states he is able to apply the cpap himself without help. RT assessed pt and cpap (H2O) to ensure everything was in working order. Pt comfortable and will call if he needs assistance. RT will continue to monitor.

## 2017-11-28 LAB — CBC
HCT: 38.8 % — ABNORMAL LOW (ref 39.0–52.0)
HEMOGLOBIN: 11.1 g/dL — AB (ref 13.0–17.0)
MCH: 30.7 pg (ref 26.0–34.0)
MCHC: 28.6 g/dL — ABNORMAL LOW (ref 30.0–36.0)
MCV: 107.5 fL — AB (ref 80.0–100.0)
Platelets: 144 10*3/uL — ABNORMAL LOW (ref 150–400)
RBC: 3.61 MIL/uL — AB (ref 4.22–5.81)
RDW: 18.6 % — ABNORMAL HIGH (ref 11.5–15.5)
WBC: 3.9 10*3/uL — ABNORMAL LOW (ref 4.0–10.5)
nRBC: 0 % (ref 0.0–0.2)

## 2017-11-28 LAB — PROTIME-INR
INR: 1.92
PROTHROMBIN TIME: 21.7 s — AB (ref 11.4–15.2)

## 2017-11-28 LAB — BASIC METABOLIC PANEL
ANION GAP: 6 (ref 5–15)
BUN: 42 mg/dL — ABNORMAL HIGH (ref 8–23)
CO2: 37 mmol/L — ABNORMAL HIGH (ref 22–32)
Calcium: 9.3 mg/dL (ref 8.9–10.3)
Chloride: 96 mmol/L — ABNORMAL LOW (ref 98–111)
Creatinine, Ser: 2.72 mg/dL — ABNORMAL HIGH (ref 0.61–1.24)
GFR calc Af Amer: 24 mL/min — ABNORMAL LOW (ref >60)
GFR calc non Af Amer: 21 mL/min — ABNORMAL LOW (ref >60)
GLUCOSE: 105 mg/dL — AB (ref 70–99)
POTASSIUM: 4.7 mmol/L (ref 3.5–5.1)
Sodium: 139 mmol/L (ref 135–145)

## 2017-11-28 LAB — GLUCOSE, CAPILLARY: Glucose-Capillary: 107 mg/dL — ABNORMAL HIGH (ref 70–99)

## 2017-11-28 LAB — MAGNESIUM: Magnesium: 2.4 mg/dL (ref 1.7–2.4)

## 2017-11-28 MED ORDER — WARFARIN SODIUM 2.5 MG PO TABS
2.5000 mg | ORAL_TABLET | Freq: Once | ORAL | Status: AC
  Administered 2017-11-28: 2.5 mg via ORAL
  Filled 2017-11-28: qty 1

## 2017-11-28 MED ORDER — TORSEMIDE 20 MG PO TABS: 20.0000 mg | ORAL_TABLET | Freq: Two times a day (BID) | ORAL | Status: DC

## 2017-11-28 NOTE — Progress Notes (Signed)
[facility] Progress Note  Patient: Omar Lambert [id number]:[id number]   PCP: Gayland Curry, DO DOB: April 26, 1940   DOA: 11/24/2017   DOS: 11/28/2017   Date of Service: the patient was seen and examined on 11/28/2017  Brief hospital course: Pt. with PMH of type II DM, COPD, hypothyroidism, HTN, CKD stage III, PAF,; admitted on 11/24/2017, presented with complaint of shortness of breath, was found to have acute hypoxic respiratory failure secondary to acute on chronic diastolic CHF with suspected pneumonia.  Patient was given IV antibiotics as well as aggressively diuresed with IV Lasix Currently further plan is monitor renal function.  Subjective: No acute complaint no nausea no vomiting no fever no chills.  Patient was sleepy and lethargic this morning at the time of my evaluation.  He slept with the CPAP for only half of the night.  Assessment and Plan: Acute hypoxic respiratory failure -Primarily secondary to acute on chronic diastolic CHF and pulmonary hypertension -Improving but remains volume overloaded with 2+ edema, JVD, abdominal wall edema and crackles on lung exam, remains hypoxic requiring 2 L O2 -Has underlying COPD -Has chronic scarring at the right base, clinically low suspicion for pneumonia -hence stopped Vancomycin, changed Cefepime to Ceftin for 5days -treated with IV Lasix, dose increased to 80mg  BID, now hold for today and then switch to oral torsemide.  - Unna boots  Acute on chronic diastolic CHF/Pulm HTN -Last echo 7/201 9 with EF of 50%,, moderate pulmonary hypertension PA pressures of 47 mmHg Patient's baseline weight is 101 kg, currently in the hospital 105 kg.  COPD -Chronic respiratory failure -No wheezing at this time, continue nebs  OSA -continue CPAP QHS -pt was using and benefiting from his CPAP at home but needs a new one as the old one keeps cutting off  -CM to FU on this Last CPAP study was in 2010 at which time auto CPAP was  recommended.  Chronic kidney disease stage 4 baseline creatinine 2.3-2.5 Mild worsening of the renal function this morning again  we will hold diuresis and continue Unna boots. Monitor renal function.   Type 2 Diabetes mellitus renal complication -diet controlled, not on medications for this, well controlled -6.2 hemoglobin A1c, continue sliding scale insulin  Elevated troponin -This is mild with a flat trend, no evidence of ACS  Paroxysmal atrial fibrillation -Rate controlled, continue Coumadin  Hypothyroidism -Continue Synthroid  Anemia of chronic kidney disease   Hb stable No active bleed.   morbid obesity  Body mass index is 36.81 kg/m.   Diet: cardiac and carb modified  DVT Prophylaxis: on therapeutic anticoagulation.  Advance goals of care discussion: full code  Family Communication: family was present at bedside, at the time of interview. The pt provided permission to discuss medical plan with the family. Opportunity was given to ask question and all questions were answered satisfactorily.   Disposition:  Discharge to home with home health likely tomorrow .  Consultants: none Procedures: none  Scheduled Meds: . calcitRIOL  0.25 mcg Oral Daily  . cefdinir  300 mg Oral Daily  . insulin aspart  0-5 Units Subcutaneous QHS  . insulin aspart  0-9 Units Subcutaneous TID WC  . levothyroxine  175 mcg Oral QAC breakfast  . mouth rinse  15 mL Mouth Rinse BID  . metoprolol succinate  12.5 mg Oral Daily  . multivitamin with minerals  1 tablet Oral Daily  . pantoprazole  40 mg Oral Daily  . rosuvastatin  20 mg Oral Daily  .  sodium chloride flush  3 mL Intravenous Q12H  . [START ON 11/29/2017] torsemide  20 mg Oral BID  . umeclidinium-vilanterol  1 puff Inhalation Daily  . Warfarin - Pharmacist Dosing Inpatient   Does not apply q1800   Continuous Infusions: . sodium chloride Stopped (11/24/17 0700)   PRN Meds: sodium chloride, acetaminophen, albuterol, sodium  chloride flush Antibiotics: Anti-infectives (From admission, onward)   Start     Dose/Rate Route Frequency Ordered Stop   11/28/17 1000  cefdinir (OMNICEF) capsule 300 mg     300 mg Oral Daily 11/27/17 1412 12/01/17 0959   11/26/17 0600  vancomycin (VANCOCIN) 1,500 mg in sodium chloride 0.9 % 500 mL IVPB  Status:  Discontinued     1,500 mg 250 mL/hr over 120 Minutes Intravenous Every 48 hours 11/24/17 0615 11/24/17 0852   11/25/17 1800  cefdinir (OMNICEF) capsule 300 mg  Status:  Discontinued     300 mg Oral Every 12 hours 11/25/17 1142 11/27/17 1412   11/24/17 0600  ceFEPIme (MAXIPIME) 1 g in sodium chloride 0.9 % 100 mL IVPB  Status:  Discontinued     1 g 200 mL/hr over 30 Minutes Intravenous Every 8 hours 11/24/17 0526 11/24/17 0532   11/24/17 0600  ceFEPIme (MAXIPIME) 1 g in sodium chloride 0.9 % 100 mL IVPB  Status:  Discontinued     1 g 200 mL/hr over 30 Minutes Intravenous Every 24 hours 11/24/17 0532 11/25/17 1142   11/24/17 0545  vancomycin (VANCOCIN) 2,000 mg in sodium chloride 0.9 % 500 mL IVPB     2,000 mg 250 mL/hr over 120 Minutes Intravenous  Once 11/24/17 0534 11/24/17 0700   11/24/17 0200  cefTRIAXone (ROCEPHIN) 1 g in sodium chloride 0.9 % 100 mL IVPB     1 g 200 mL/hr over 30 Minutes Intravenous  Once 11/24/17 0153 11/24/17 0237   11/24/17 0200  azithromycin (ZITHROMAX) 500 mg in sodium chloride 0.9 % 250 mL IVPB     500 mg 250 mL/hr over 60 Minutes Intravenous  Once 11/24/17 0153 11/24/17 0354       Objective: Physical Exam: Vitals:   11/27/17 1659 11/27/17 2050 11/28/17 0010 11/28/17 0448  BP: 122/83  (!) 152/93   Pulse: 76 71 65   Resp:  18 (!) 22   Temp: 97.6 F (36.4 C)  (!) 97.3 F (36.3 C)   TempSrc: Oral  Oral   SpO2: 96% 97% 95%   Weight:    106.6 kg  Height:        Intake/Output Summary (Last 24 hours) at 11/28/2017 0809 Last data filed at 11/28/2017 0600 Gross per 24 hour  Intake 720 ml  Output 450 ml  Net 270 ml   Filed Weights    11/24/17 0600 11/27/17 0303 11/28/17 0448  Weight: 105.7 kg 107 kg 106.6 kg   General: Alert, Awake and Oriented to Time, Place and Person. Appear in mild distress, affect appropriate Eyes: PERRL, Conjunctiva normal ENT: Oral Mucosa clear moist. Neck: no JVD, no Abnormal Mass Or lumps Cardiovascular: S1 and S2 Present, no Murmur, Peripheral Pulses Present Respiratory: normal respiratory effort, Bilateral Air entry equal and Decreased, no use of accessory muscle, bilateral  Crackles, no wheezes Abdomen: Bowel Sound present, Soft and no tenderness, no hernia Skin: no redness, no Rash, no induration Extremities: bilateral  Pedal edema, no calf tenderness Neurologic: Grossly no focal neuro deficit. Bilaterally Equal motor strength  Data Reviewed: CBC: Recent Labs  Lab 11/24/17 0041 11/25/17 0302 11/28/17 0246  WBC 3.9* 3.9* 3.9*  HGB 11.3* 10.4* 11.1*  HCT 39.0 36.9* 38.8*  MCV 107.1* 107.3* 107.5*  PLT 157 150 244*   Basic Metabolic Panel: Recent Labs  Lab 11/24/17 0041 11/25/17 0302 11/26/17 0812 11/27/17 0255 11/28/17 0246  NA 144 141 141 140 139  K 3.2* 3.7 4.7 4.8 4.7  CL 98 98 99 95* 96*  CO2 35* 34* 35* 34* 37*  GLUCOSE 131* 107* 87 90 105*  BUN 32* 33* 34* 39* 42*  CREATININE 2.46* 2.31* 2.51* 2.63* 2.72*  CALCIUM 9.4 8.8* 9.0 9.4 9.3  MG  --   --   --  2.3 2.4    Liver Function Tests: No results for input(s): AST, ALT, ALKPHOS, BILITOT, PROT, ALBUMIN in the last 168 hours. No results for input(s): LIPASE, AMYLASE in the last 168 hours. No results for input(s): AMMONIA in the last 168 hours. Coagulation Profile: Recent Labs  Lab 11/24/17 0147 11/25/17 0302 11/26/17 0217 11/27/17 0255 11/28/17 0246  INR 1.88 1.83 1.68 1.73 1.92   Cardiac Enzymes: Recent Labs  Lab 11/24/17 0546  CKTOTAL 287  CKMB 6.0*  TROPONINI 0.08*   BNP (last 3 results) No results for input(s): PROBNP in the last 8760 hours. CBG: Recent Labs  Lab 11/27/17 0758  11/27/17 1149 11/27/17 1656 11/27/17 2128 11/28/17 0723  GLUCAP 94 95 122* 114* 107*   Studies: No results found.   Time spent: 35 minutes  Author: Berle Mull, MD [facility] Pager: [phone] 11/28/2017 8:09 AM  Between 7PM-7AM, please contact night-coverage at [email or web address], password [facility]

## 2017-11-28 NOTE — Progress Notes (Signed)
ANTICOAGULATION CONSULT NOTE - Follow-Up Consult  Pharmacy Consult for Coumadin Indication: Afib  No Known Allergies  Patient Measurements: Height: 5' 7" (170.2 cm) Weight: 235 lb 0.2 oz (106.6 kg) IBW/kg (Calculated) : 66.1  Vital Signs: Temp: 97.3 F (36.3 C) (10/24 0010) Temp Source: Oral (10/24 0010) BP: 152/93 (10/24 0010) Pulse Rate: 65 (10/24 0010)  Labs: Recent Labs    11/26/17 0217 11/26/17 0812 11/27/17 0255 11/28/17 0246  HGB  --   --   --  11.1*  HCT  --   --   --  38.8*  PLT  --   --   --  144*  LABPROT 19.6*  --  20.0* 21.7*  INR 1.68  --  1.73 1.92  CREATININE  --  2.51* 2.63* 2.72*    Estimated Creatinine Clearance: 26.1 mL/min (A) (by C-G formula based on SCr of 2.72 mg/dL (H)).   Medical History: Past Medical History:  Diagnosis Date  . Acute kidney injury superimposed on chronic kidney disease ([place]) 11/2016  . Acute respiratory failure ([place]) 11/2016  . Allergy   . Asthma   . Atrial fibrillation ([place])   . 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 Benign essential hypertension   . CHF (congestive heart failure) ([place])   . CKD (chronic kidney disease), stage III ([place])   . COPD (chronic obstructive pulmonary disease) ([place])   . Diabetes ([place])   . Diabetes mellitus without complication ([place])   . Dyspnea   . Gastritis   . GERD (gastroesophageal reflux disease)   . Gout attack 09/2012  . Hypertension   . Sinusitis, maxillary, chronic   . Thyroid disease   . Type II or unspecified type diabetes mellitus without mention of complication, not stated as uncontrolled     Medications:  Medications Prior to Admission  Medication Sig Dispense Refill Last Dose  . acetaminophen (TYLENOL) 325 MG tablet Take 650 mg by mouth every 6 (six) hours as needed for mild pain.   Past Week at Unknown time  . albuterol (PROVENTIL) (2.5 MG/3ML) 0.083% nebulizer solution INHALE CONTENTS OF 1 VIAL IN NEBULIZER EVERY 4 HOURS AS NEEDED FOR WHEEZING AND  ASTHMA (Patient taking differently: Take 2.5 mg by nebulization every 4 (four) hours as needed for wheezing. ) 75 mL 4 11/23/2017 at Unknown time  . allopurinol (ZYLOPRIM) 300 MG tablet TAKE 1 TABLET BY MOUTH EVERY DAY (Patient taking differently: Take 300 mg by mouth daily. ) 30 tablet 6 11/23/2017 at Unknown time  . calcitRIOL (ROCALTROL) 0.25 MCG capsule Take 0.25 mcg by mouth daily.    11/23/2017 at Unknown time  . isosorbide mononitrate (IMDUR) 30 MG 24 hr tablet Take 1 tablet (30 mg total) by mouth daily. 30 tablet 3 11/23/2017 at Unknown time  . KLOR-CON M20 20 MEQ tablet TAKE 1 TABLET BY MOUTH EVERY DAY (Patient taking differently: Take 20 mEq by mouth daily. ) 30 tablet 1 11/23/2017 at Unknown time  . levothyroxine (SYNTHROID, LEVOTHROID) 175 MCG tablet TAKE 1 TABLET EVERY DAY BEFORE BREAKFAST (Patient taking differently: Take 175 mcg by mouth daily before breakfast. ) 30 tablet 3 11/23/2017 at Unknown time  . Multiple Vitamins-Minerals (CENTRUM SILVER ADULT 50+ PO) Take 1 tablet by mouth daily.   11/23/2017 at Unknown time  . pantoprazole (PROTONIX) 40 MG tablet TAKE 1 TABLET BY MOUTH EVERY DAY BEFORE SUPPER (Patient taking differently: Take 40 mg by mouth daily. ) 90 tablet 0 11/23/2017 at Unknown time  . rosuvastatin (CRESTOR)  20 MG tablet Take 1 tablet (20 mg total) by mouth daily. 30 tablet 3 11/23/2017 at Unknown time  . torsemide (DEMADEX) 20 MG tablet TAKE 1 TABLET TWICE A DAY (Patient taking differently: Take 20 mg by mouth 2 (two) times daily. ) 60 tablet 3 11/23/2017 at Unknown time  . umeclidinium-vilanterol (ANORO ELLIPTA) 62.5-25 MCG/INH AEPB Inhale 1 puff into the lungs daily. 30 each 11 11/23/2017 at Unknown time  . warfarin (COUMADIN) 5 MG tablet TAKE 1/2 TO 1 TABLET BY MOUTH DAILY AS DIRECTED BY COUMADIN CLINIC (Patient taking differently: Take 5 mg by mouth every evening. ) 30 tablet 0 11/23/2017 at 2130  . SUPREP BOWEL PREP KIT 17.5-3.13-1.6 GM/177ML SOLN Take 1 kit by mouth  as directed. For colonoscopy prep (Patient not taking: Reported on 11/24/2017) 2 Bottle 0 Completed Course at Unknown time  . UNABLE TO FIND CPAP  Unknown DME- prescribed by doc in [facility]   Taking   Scheduled:  . calcitRIOL  0.25 mcg Oral Daily  . cefdinir  300 mg Oral Daily  . levothyroxine  175 mcg Oral QAC breakfast  . mouth rinse  15 mL Mouth Rinse BID  . metoprolol succinate  12.5 mg Oral Daily  . multivitamin with minerals  1 tablet Oral Daily  . pantoprazole  40 mg Oral Daily  . rosuvastatin  20 mg Oral Daily  . sodium chloride flush  3 mL Intravenous Q12H  . [START ON 11/29/2017] torsemide  20 mg Oral BID  . umeclidinium-vilanterol  1 puff Inhalation Daily  . Warfarin - Pharmacist Dosing Inpatient   Does not apply q1800   Infusions:  . sodium chloride Stopped (11/24/17 0700)    Assessment: 77 yo male c/o worsening SOB over 2wk, admitted for PNA and started on IV ABX.  Now on PO Cefdinir through 10/26.    Pt to continue Coumadin for Afib.  PTA dose = 2.7m daily except 570mon Wed/Fri.  INR trending up after higher doses.    Goal of Therapy:  INR 2-3   Plan:  Restart home regime - Coumadin 2.40m26mO x1 Daily INR  KimManpower Incharm.D., BCPS Clinical Pharmacist Pager: [phone] phone for 11/28/2017 from 8:30-4:00 is [phone]**Pharmacist phone directory can now be found on [email or web address] ([email or web address]).  Listed under [facility] [place]10/24/2019 12:07 PM

## 2017-11-28 NOTE — Evaluation (Signed)
Physical Therapy Evaluation Patient Details Name: DONTREY SNELLGROVE MRN: [id number] DOB: [birth year] Today's Date: 11/28/2017   History of Present Illness   77 year old male with history of type 2 diabetes mellitus, COPD, hypothyroidism, hypertension, chronic kidney disease stage III, paroxysmal atrial fibrillation, diabetes mellitus type 2 presented to the emergency room with progressive dyspnea on exertion, productive cough, orthopnea, lower extremity edema for 2 weeks.Chest [place] showed pulmonary edema.    Clinical Impression  Pt admitted with above diagnosis. Pt currently with functional limitations due to the deficits listed below (see PT Problem List). Pt O2 on RA 92%.  Pt sats with activity at times not showing up on monitor and other times showing 85% and DOE 3/4.  Replaced 2LO2 with sats 95%.  May need home O2 depending on how sats progress.   Pt also reports a pain in his left side 5/10 that comes and goes and has lasted a few weeks. Did not tell PT about pain until the end of the treatment.   Pt does not need device to ambulate today and wife and pt state he is close to baseline mobility. Pt will benefit from skilled PT to increase their independence and safety with mobility to allow discharge to the venue listed below.      Follow Up Recommendations Home health PT;Supervision - Intermittent    Equipment Recommendations  None recommended by PT    Recommendations for Other Services       Precautions / Restrictions Precautions Precautions: Fall Restrictions Weight Bearing Restrictions: No      Mobility  Bed Mobility Overal bed mobility: Independent                Transfers Overall transfer level: Independent                  Ambulation/Gait Ambulation/Gait assistance: Min guard Gait Distance (Feet): 70 Feet Assistive device: None Gait Pattern/deviations: Step-through pattern;Decreased stride length   Gait velocity interpretation: <1.31 ft/sec, indicative of  household ambulator General Gait Details: No LOB with pt appearing close to baseline per wife and pt. Did need to keep O2 on to maintain sats >90%  Stairs            Wheelchair Mobility    Modified Rankin (Stroke Patients Only)       Balance Overall balance assessment: Needs assistance Sitting-balance support: No upper extremity supported;Feet supported Sitting balance-Leahy Scale: Fair     Standing balance support: No upper extremity supported;During functional activity Standing balance-Leahy Scale: Fair                               Pertinent Vitals/Pain Pain Assessment: No/denies pain    Home Living Family/patient expects to be discharged to:: Private residence Living Arrangements: Spouse/significant other Available Help at Discharge: Family;Available 24 hours/day(Part time job for wife in evenings) Type of Home: House Home Access: Level entry     Home Layout: One [place]: [place] - single point;Walker - 2 wheels;Bedside commode      Prior Function Level of Independence: Independent               Hand Dominance   Dominant Hand: Right    Extremity/Trunk Assessment   Upper Extremity Assessment Upper Extremity Assessment: Defer to OT evaluation    Lower Extremity Assessment Lower Extremity Assessment: Generalized weakness    Cervical / Trunk Assessment Cervical / Trunk Assessment: Normal  Communication   Communication:  No difficulties  Cognition Arousal/Alertness: Awake/alert Behavior During Therapy: WFL for tasks assessed/performed Overall Cognitive Status: Within Functional Limits for tasks assessed                                        General Comments      Exercises     Assessment/Plan    PT Assessment Patient needs continued PT services  PT Problem List Decreased activity tolerance;Decreased balance;Decreased mobility;Decreased knowledge of use of DME;Decreased safety awareness;Decreased  knowledge of precautions;Cardiopulmonary status limiting activity       PT Treatment Interventions DME instruction;Gait training;Functional mobility training;Therapeutic activities;Therapeutic exercise;Balance training;Patient/family education    PT Goals (Current goals can be found in the Care Plan section)  Acute Rehab PT Goals Patient Stated Goal: to go home PT Goal Formulation: With patient Time For Goal Achievement: 12/12/17 Potential to Achieve Goals: Good    Frequency Min 3X/week   Barriers to discharge        Co-evaluation               AM-PAC PT "6 Clicks" Daily Activity  Outcome Measure Difficulty turning over in bed (including adjusting bedclothes, sheets and blankets)?: None Difficulty moving from lying on back to sitting on the side of the bed? : None Difficulty sitting down on and standing up from a chair with arms (e.g., wheelchair, bedside commode, etc,.)?: None Help needed moving to and from a bed to chair (including a wheelchair)?: None Help needed walking in hospital room?: None Help needed climbing 3-5 steps with a railing? : None 6 Click Score: 24    End of Session Equipment Utilized During Treatment: Gait belt;Oxygen Activity Tolerance: Patient limited by fatigue Patient left: with call bell/phone within reach;with family/visitor present(on EOB) Nurse Communication: Mobility status PT Visit Diagnosis: Muscle weakness (generalized) (M62.81)    Time: 1130-1150 PT Time Calculation (min) (ACUTE ONLY): 20 min   Charges:   PT Evaluation $PT Eval Moderate Complexity: 1 Mod          Margaret Cockerill,PT Acute Rehabilitation Services Pager:  [phone]  Office:  [place] 11/28/2017, 1:43 PM

## 2017-11-28 NOTE — Care Management Important Message (Signed)
Important Message  Patient Details  Name: Omar Lambert MRN: [id number] Date of Birth: 08/16/1940   Medicare Important Message Given:  Yes    Vetra Shinall 11/28/2017, 11:51 AM

## 2017-11-28 NOTE — Progress Notes (Signed)
Pt placed on CPAP for the night- tolerating well at this time with 2 liters of oxygen bled in as same setting of nasal cannula at time of placement

## 2017-11-29 LAB — GLUCOSE, CAPILLARY: GLUCOSE-CAPILLARY: 115 mg/dL — AB (ref 70–99)

## 2017-11-29 LAB — BLOOD GAS, ARTERIAL
ACID-BASE EXCESS: 9.1 mmol/L — AB (ref 0.0–2.0)
Acid-Base Excess: 10.7 mmol/L — ABNORMAL HIGH (ref 0.0–2.0)
BICARBONATE: 35.1 mmol/L — AB (ref 20.0–28.0)
BICARBONATE: 37.2 mmol/L — AB (ref 20.0–28.0)
DELIVERY SYSTEMS: POSITIVE
DRAWN BY: 36529
Expiratory PAP: 7
FIO2: 35
Inspiratory PAP: 14
LHR: 8 {breaths}/min
O2 CONTENT: 3 L/min
O2 Saturation: 92.3 %
O2 Saturation: 94.6 %
PATIENT TEMPERATURE: 98.6
PH ART: 7.321 — AB (ref 7.350–7.450)
Patient temperature: 98.6
pCO2 arterial: 77.8 mmHg (ref 32.0–48.0)
pCO2 arterial: 77.8 mmHg (ref 32.0–48.0)
pH, Arterial: 7.301 — ABNORMAL LOW (ref 7.350–7.450)
pO2, Arterial: 71 mmHg — ABNORMAL LOW (ref 83.0–108.0)
pO2, Arterial: 80.6 mmHg — ABNORMAL LOW (ref 83.0–108.0)

## 2017-11-29 LAB — T4, FREE: FREE T4: 1.4 ng/dL (ref 0.82–1.77)

## 2017-11-29 LAB — HEPATIC FUNCTION PANEL
ALK PHOS: 194 U/L — AB (ref 38–126)
ALT: 24 U/L (ref 0–44)
AST: 43 U/L — ABNORMAL HIGH (ref 15–41)
Albumin: 3.6 g/dL (ref 3.5–5.0)
BILIRUBIN DIRECT: 0.5 mg/dL — AB (ref 0.0–0.2)
Indirect Bilirubin: 0.7 mg/dL (ref 0.3–0.9)
TOTAL PROTEIN: 7.8 g/dL (ref 6.5–8.1)
Total Bilirubin: 1.2 mg/dL (ref 0.3–1.2)

## 2017-11-29 LAB — BASIC METABOLIC PANEL
Anion gap: 11 (ref 5–15)
BUN: 47 mg/dL — AB (ref 8–23)
CALCIUM: 9.7 mg/dL (ref 8.9–10.3)
CO2: 34 mmol/L — AB (ref 22–32)
Chloride: 95 mmol/L — ABNORMAL LOW (ref 98–111)
Creatinine, Ser: 2.83 mg/dL — ABNORMAL HIGH (ref 0.61–1.24)
GFR calc Af Amer: 23 mL/min — ABNORMAL LOW (ref >60)
GFR, EST NON AFRICAN AMERICAN: 20 mL/min — AB (ref >60)
GLUCOSE: 85 mg/dL (ref 70–99)
Potassium: 5.6 mmol/L — ABNORMAL HIGH (ref 3.5–5.1)
Sodium: 140 mmol/L (ref 135–145)

## 2017-11-29 LAB — CBC
HCT: 39.7 % (ref 39.0–52.0)
Hemoglobin: 11.2 g/dL — ABNORMAL LOW (ref 13.0–17.0)
MCH: 30.4 pg (ref 26.0–34.0)
MCHC: 28.2 g/dL — AB (ref 30.0–36.0)
MCV: 107.9 fL — AB (ref 80.0–100.0)
PLATELETS: 153 10*3/uL (ref 150–400)
RBC: 3.68 MIL/uL — ABNORMAL LOW (ref 4.22–5.81)
RDW: 18.2 % — ABNORMAL HIGH (ref 11.5–15.5)
WBC: 4.8 10*3/uL (ref 4.0–10.5)
nRBC: 0 % (ref 0.0–0.2)

## 2017-11-29 LAB — PROTIME-INR
INR: 2.05
PROTHROMBIN TIME: 22.9 s — AB (ref 11.4–15.2)

## 2017-11-29 LAB — AMMONIA: AMMONIA: 92 umol/L — AB (ref 9–35)

## 2017-11-29 LAB — VITAMIN B12: Vitamin B-12: 451 pg/mL (ref 180–914)

## 2017-11-29 LAB — TSH: TSH: 1.258 u[IU]/mL (ref 0.350–4.500)

## 2017-11-29 MED ORDER — SODIUM POLYSTYRENE SULFONATE 15 GM/60ML PO SUSP
30.0000 g | Freq: Once | ORAL | Status: AC
  Administered 2017-11-29: 30 g via ORAL
  Filled 2017-11-29: qty 120

## 2017-11-29 MED ORDER — WARFARIN SODIUM 5 MG PO TABS
5.0000 mg | ORAL_TABLET | Freq: Once | ORAL | Status: AC
  Administered 2017-11-29: 5 mg via ORAL
  Filled 2017-11-29: qty 1

## 2017-11-29 NOTE — Progress Notes (Signed)
Pt currently off CPAP- pt has worn CPAP only for a very short time tonight

## 2017-11-29 NOTE — Progress Notes (Signed)
ANTICOAGULATION CONSULT NOTE - Follow-Up Consult  Pharmacy Consult for Coumadin Indication: Afib  No Known Allergies  Patient Measurements: Height: '5\' 7"'  (170.2 cm) Weight: 237 lb 3.2 oz (107.6 kg) IBW/kg (Calculated) : 66.1  Vital Signs: Temp: 97.7 F (36.5 C) (10/25 0710) Temp Source: Oral (10/25 0710) BP: 148/90 (10/25 0710) Pulse Rate: 95 (10/25 1134)  Labs: Recent Labs    11/27/17 0255 11/28/17 0246 11/29/17 0317  HGB  --  11.1* 11.2*  HCT  --  38.8* 39.7  PLT  --  144* 153  LABPROT 20.0* 21.7* 22.9*  INR 1.73 1.92 2.05  CREATININE 2.63* 2.72* 2.83*    Estimated Creatinine Clearance: 25.2 mL/min (A) (by C-G formula based on SCr of 2.83 mg/dL (H)).   Medical History: Past Medical History:  Diagnosis Date  . Acute kidney injury superimposed on chronic kidney disease ([place]) 11/2016  . Acute respiratory failure ([place]) 11/2016  . Allergy   . Asthma   . Atrial fibrillation ([place])   . 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 Benign essential hypertension   . CHF (congestive heart failure) ([place])   . CKD (chronic kidney disease), stage III ([place])   . COPD (chronic obstructive pulmonary disease) ([place])   . Diabetes ([place])   . Diabetes mellitus without complication ([place])   . Dyspnea   . Gastritis   . GERD (gastroesophageal reflux disease)   . Gout attack 09/2012  . Hypertension   . Sinusitis, maxillary, chronic   . Thyroid disease   . Type II or unspecified type diabetes mellitus without mention of complication, not stated as uncontrolled     Medications:  Medications Prior to Admission  Medication Sig Dispense Refill Last Dose  . acetaminophen (TYLENOL) 325 MG tablet Take 650 mg by mouth every 6 (six) hours as needed for mild pain.   Past Week at Unknown time  . albuterol (PROVENTIL) (2.5 MG/3ML) 0.083% nebulizer solution INHALE CONTENTS OF 1 VIAL IN NEBULIZER EVERY 4 HOURS AS NEEDED FOR WHEEZING AND ASTHMA (Patient taking differently: Take 2.5 mg  by nebulization every 4 (four) hours as needed for wheezing. ) 75 mL 4 11/23/2017 at Unknown time  . allopurinol (ZYLOPRIM) 300 MG tablet TAKE 1 TABLET BY MOUTH EVERY DAY (Patient taking differently: Take 300 mg by mouth daily. ) 30 tablet 6 11/23/2017 at Unknown time  . calcitRIOL (ROCALTROL) 0.25 MCG capsule Take 0.25 mcg by mouth daily.    11/23/2017 at Unknown time  . isosorbide mononitrate (IMDUR) 30 MG 24 hr tablet Take 1 tablet (30 mg total) by mouth daily. 30 tablet 3 11/23/2017 at Unknown time  . KLOR-CON M20 20 MEQ tablet TAKE 1 TABLET BY MOUTH EVERY DAY (Patient taking differently: Take 20 mEq by mouth daily. ) 30 tablet 1 11/23/2017 at Unknown time  . levothyroxine (SYNTHROID, LEVOTHROID) 175 MCG tablet TAKE 1 TABLET EVERY DAY BEFORE BREAKFAST (Patient taking differently: Take 175 mcg by mouth daily before breakfast. ) 30 tablet 3 11/23/2017 at Unknown time  . Multiple Vitamins-Minerals (CENTRUM SILVER ADULT 50+ PO) Take 1 tablet by mouth daily.   11/23/2017 at Unknown time  . pantoprazole (PROTONIX) 40 MG tablet TAKE 1 TABLET BY MOUTH EVERY DAY BEFORE SUPPER (Patient taking differently: Take 40 mg by mouth daily. ) 90 tablet 0 11/23/2017 at Unknown time  . rosuvastatin (CRESTOR) 20 MG tablet Take 1 tablet (20 mg total) by mouth daily. 30 tablet 3 11/23/2017 at Unknown time  . torsemide (DEMADEX) 20 MG tablet  TAKE 1 TABLET TWICE A DAY (Patient taking differently: Take 20 mg by mouth 2 (two) times daily. ) 60 tablet 3 11/23/2017 at Unknown time  . umeclidinium-vilanterol (ANORO ELLIPTA) 62.5-25 MCG/INH AEPB Inhale 1 puff into the lungs daily. 30 each 11 11/23/2017 at Unknown time  . warfarin (COUMADIN) 5 MG tablet TAKE 1/2 TO 1 TABLET BY MOUTH DAILY AS DIRECTED BY COUMADIN CLINIC (Patient taking differently: Take 5 mg by mouth every evening. ) 30 tablet 0 11/23/2017 at 2130  . SUPREP BOWEL PREP KIT 17.5-3.13-1.6 GM/177ML SOLN Take 1 kit by mouth as directed. For colonoscopy prep (Patient not  taking: Reported on 11/24/2017) 2 Bottle 0 Completed Course at Unknown time  . UNABLE TO FIND CPAP  Unknown DME- prescribed by doc in [facility]   Taking   Scheduled:  . calcitRIOL  0.25 mcg Oral Daily  . levothyroxine  175 mcg Oral QAC breakfast  . mouth rinse  15 mL Mouth Rinse BID  . metoprolol succinate  12.5 mg Oral Daily  . multivitamin with minerals  1 tablet Oral Daily  . pantoprazole  40 mg Oral Daily  . rosuvastatin  20 mg Oral Daily  . sodium chloride flush  3 mL Intravenous Q12H  . umeclidinium-vilanterol  1 puff Inhalation Daily  . Warfarin - Pharmacist Dosing Inpatient   Does not apply q1800   Infusions:  . sodium chloride Stopped (11/24/17 0700)    Assessment: 77 yo male c/o worsening SOB over 2wk, admitted for PNA and started on IV ABX.  Now on PO Cefdinir through 10/26.    Pt to continue Coumadin for Afib.  PTA dose = 2.90m daily except 546mon Wed/Fri.  INR now therapeutic.  Goal of Therapy:  INR 2-3   Plan:  Coumadin 7m59mO x1 Daily INR  KimManpower Incharm.D., BCPS Clinical Pharmacist Pager: [phone] phone for 11/29/2017 from 8:30-4:00 is [phone]**Pharmacist phone directory can now be found on [email or web address] ([email or web address]).  Listed under [facility] [place]10/25/2019 1:31 PM

## 2017-11-29 NOTE — Progress Notes (Signed)
[facility] Progress Note  Patient: Omar Lambert SWF:[id number]   PCP: Gayland Curry, DO DOB: November 17, 1940   DOA: 11/24/2017   DOS: 11/29/2017   Date of Service: the patient was seen and examined on 11/29/2017  Brief hospital course: Pt. with PMH of type II DM, COPD, hypothyroidism, HTN, CKD stage III, PAF,; admitted on 11/24/2017, presented with complaint of shortness of breath, was found to have acute hypoxic respiratory failure secondary to acute on chronic diastolic CHF with suspected pneumonia.  Patient was given IV antibiotics as well as aggressively diuresed with IV Lasix Currently further plan is continue respiratory support with BiPAP.  Subjective: More sleepy this morning.  Unable to follow any significant commands.  No acute events overnight.  Refused CPAP last night.  Assessment and Plan: 1.  Acute hypoxic and hypercarbic respiratory failure. Acute on chronic diastolic CHF. Pulmonary hypertension. OSA-noncompliant with CPAP. COPD exacerbation  Presents with shortness of breath, was hypoxic initially on 2 L of oxygen now. Last echocardiogram shows 50% EF, moderate pulmonary hypertension. Baseline weight is 101 kg currently running around 105 kg. Received IV Lasix with renal function worsening. Received IV antibiotics for concern for pneumonia-culture negative completed the antibiotic course so far. Unna boots were placed. On 11/28/2017 patient becomes more lethargic, ABG showed severe hypercarbia with respiratory acidosis. Patient will be placed on BiPAP and will recheck the gas. Continue BiPAP for next 24 to 48 hours and transition to CPAP again. Hold diuresis. Continue nebulizers. pt was using and benefiting from his CPAP at home but needs a new one as the old one keeps cutting off  CM to FU on this Last CPAP study was in 2010 at which time auto CPAP was recommended.  2.  Acute on chronic kidney disease stage IV. Hyperkalemia Baseline renal function is  2.3-2.5.  Currently running around 2.7. Also has hyperkalemia. We will give Kayexalate. Monitor BMP. Continue to monitor on telemetry. Holding diuresis for now.  3.  Elevated troponin. Mild associated with chronic kidney disease as well as demand ischemia. No evidence of ACS.  4.  Paroxysmal A. fib. Currently rate controlled. Continue Coumadin.  5. Type 2 Diabetes Mellitus, uncontroled with hyperglycemia and renal complication Diet controlled. last hemoglobin A1c was 6.2  6. Anemia of chronic kidney disease Hemoglobin stable. Monitor.  7.  Morbid obesity  Body mass index is 37.15 kg/m.   8. Hypothyroidism -Continue Synthroid  Diet: cardiac diet DVT Prophylaxis: on therapeutic anticoagulation.  Advance goals of care discussion: full code  Family Communication: family was present at bedside, at the time of interview. The pt provided permission to discuss medical plan with the family. Opportunity was given to ask question and all questions were answered satisfactorily.   Disposition:  Discharge to be determined.  Consultants: none Procedures: noen  Scheduled Meds: . calcitRIOL  0.25 mcg Oral Daily  . levothyroxine  175 mcg Oral QAC breakfast  . mouth rinse  15 mL Mouth Rinse BID  . metoprolol succinate  12.5 mg Oral Daily  . multivitamin with minerals  1 tablet Oral Daily  . pantoprazole  40 mg Oral Daily  . rosuvastatin  20 mg Oral Daily  . sodium chloride flush  3 mL Intravenous Q12H  . umeclidinium-vilanterol  1 puff Inhalation Daily  . warfarin  5 mg Oral ONCE-1800  . Warfarin - Pharmacist Dosing Inpatient   Does not apply q1800   Continuous Infusions: . sodium chloride Stopped (11/24/17 0700)   PRN Meds: sodium chloride, acetaminophen,  albuterol, sodium chloride flush Antibiotics: Anti-infectives (From admission, onward)   Start     Dose/Rate Route Frequency Ordered Stop   11/28/17 1000  cefdinir (OMNICEF) capsule 300 mg  Status:  Discontinued     300  mg Oral Daily 11/27/17 1412 11/28/17 1222   11/26/17 0600  vancomycin (VANCOCIN) 1,500 mg in sodium chloride 0.9 % 500 mL IVPB  Status:  Discontinued     1,500 mg 250 mL/hr over 120 Minutes Intravenous Every 48 hours 11/24/17 0615 11/24/17 0852   11/25/17 1800  cefdinir (OMNICEF) capsule 300 mg  Status:  Discontinued     300 mg Oral Every 12 hours 11/25/17 1142 11/27/17 1412   11/24/17 0600  ceFEPIme (MAXIPIME) 1 g in sodium chloride 0.9 % 100 mL IVPB  Status:  Discontinued     1 g 200 mL/hr over 30 Minutes Intravenous Every 8 hours 11/24/17 0526 11/24/17 0532   11/24/17 0600  ceFEPIme (MAXIPIME) 1 g in sodium chloride 0.9 % 100 mL IVPB  Status:  Discontinued     1 g 200 mL/hr over 30 Minutes Intravenous Every 24 hours 11/24/17 0532 11/25/17 1142   11/24/17 0545  vancomycin (VANCOCIN) 2,000 mg in sodium chloride 0.9 % 500 mL IVPB     2,000 mg 250 mL/hr over 120 Minutes Intravenous  Once 11/24/17 0534 11/24/17 0700   11/24/17 0200  cefTRIAXone (ROCEPHIN) 1 g in sodium chloride 0.9 % 100 mL IVPB     1 g 200 mL/hr over 30 Minutes Intravenous  Once 11/24/17 0153 11/24/17 0237   11/24/17 0200  azithromycin (ZITHROMAX) 500 mg in sodium chloride 0.9 % 250 mL IVPB     500 mg 250 mL/hr over 60 Minutes Intravenous  Once 11/24/17 0153 11/24/17 0354       Objective: Physical Exam: Vitals:   11/29/17 0615 11/29/17 0710 11/29/17 0732 11/29/17 1134  BP:  (!) 148/90    Pulse:  95 95 95  Resp:  16 (!) 21 (!) 36  Temp:  97.7 F (36.5 C)    TempSrc:  Oral    SpO2:  94% 95% 97%  Weight: 107.6 kg     Height:        Intake/Output Summary (Last 24 hours) at 11/29/2017 1503 Last data filed at 11/29/2017 0600 Gross per 24 hour  Intake 360 ml  Output -  Net 360 ml   Filed Weights   11/27/17 0303 11/28/17 0448 11/29/17 0615  Weight: 107 kg 106.6 kg 107.6 kg   General: Alert, Awake and Oriented to perrson. Appear in marked distress, affect difficult to assess Eyes: PERRL, Conjunctiva  normal ENT: Oral Mucosa clear moist. Neck: difficult to assess  JVD, no Abnormal Mass Or lumps Cardiovascular: S1 and S2 Present, no Murmur, Peripheral Pulses Present Respiratory: increased respiratory effort, Bilateral Air entry equal and Decreased, no use of accessory muscle, bilateral  Crackles, no wheezes Abdomen: Bowel Sound present, Soft and no tenderness, no hernia Skin: no redness, no Rash, no induration Extremities: bilateral  Pedal edema, no calf tenderness Neurologic: Grossly no focal neuro deficit. Bilaterally Equal motor strength  Data Reviewed: CBC: Recent Labs  Lab 11/24/17 0041 11/25/17 0302 11/28/17 0246 11/29/17 0317  WBC 3.9* 3.9* 3.9* 4.8  HGB 11.3* 10.4* 11.1* 11.2*  HCT 39.0 36.9* 38.8* 39.7  MCV 107.1* 107.3* 107.5* 107.9*  PLT 157 150 144* 338   Basic Metabolic Panel: Recent Labs  Lab 11/25/17 0302 11/26/17 0812 11/27/17 0255 11/28/17 0246 11/29/17 0317  NA 141 141  140 139 140  K 3.7 4.7 4.8 4.7 5.6*  CL 98 99 95* 96* 95*  CO2 34* 35* 34* 37* 34*  GLUCOSE 107* 87 90 105* 85  BUN 33* 34* 39* 42* 47*  CREATININE 2.31* 2.51* 2.63* 2.72* 2.83*  CALCIUM 8.8* 9.0 9.4 9.3 9.7  MG  --   --  2.3 2.4  --     Liver Function Tests: Recent Labs  Lab 11/29/17 0317  AST 43*  ALT 24  ALKPHOS 194*  BILITOT 1.2  PROT 7.8  ALBUMIN 3.6   No results for input(s): LIPASE, AMYLASE in the last 168 hours. No results for input(s): AMMONIA in the last 168 hours. Coagulation Profile: Recent Labs  Lab 11/25/17 0302 11/26/17 0217 11/27/17 0255 11/28/17 0246 11/29/17 0317  INR 1.83 1.68 1.73 1.92 2.05   Cardiac Enzymes: Recent Labs  Lab 11/24/17 0546  CKTOTAL 287  CKMB 6.0*  TROPONINI 0.08*   BNP (last 3 results) No results for input(s): PROBNP in the last 8760 hours. CBG: Recent Labs  Lab 11/27/17 0758 11/27/17 1149 11/27/17 1656 11/27/17 2128 11/28/17 0723  GLUCAP 94 95 122* 114* 107*   Studies: No results found.   Time spent: The  patient is critically ill with multiple organ systems failure and requires high complexity decision making for assessment and support, frequent evaluation and titration of therapies. Critical Care Time devoted to patient care services described in this note is 35 minutes  Author: Berle Mull, MD [facility] Pager: [phone] 11/29/2017 3:03 PM  Between 7PM-7AM, please contact night-coverage at [email or web address], password [facility]

## 2017-11-30 LAB — COMPREHENSIVE METABOLIC PANEL
ALT: 22 U/L (ref 0–44)
AST: 42 U/L — ABNORMAL HIGH (ref 15–41)
Albumin: 3.5 g/dL (ref 3.5–5.0)
Alkaline Phosphatase: 171 U/L — ABNORMAL HIGH (ref 38–126)
Anion gap: 10 (ref 5–15)
BUN: 49 mg/dL — ABNORMAL HIGH (ref 8–23)
CHLORIDE: 97 mmol/L — AB (ref 98–111)
CO2: 33 mmol/L — AB (ref 22–32)
CREATININE: 2.74 mg/dL — AB (ref 0.61–1.24)
Calcium: 9.2 mg/dL (ref 8.9–10.3)
GFR, EST AFRICAN AMERICAN: 24 mL/min — AB (ref >60)
GFR, EST NON AFRICAN AMERICAN: 21 mL/min — AB (ref >60)
Glucose, Bld: 89 mg/dL (ref 70–99)
POTASSIUM: 4.8 mmol/L (ref 3.5–5.1)
SODIUM: 140 mmol/L (ref 135–145)
Total Bilirubin: 1.5 mg/dL — ABNORMAL HIGH (ref 0.3–1.2)
Total Protein: 7.4 g/dL (ref 6.5–8.1)

## 2017-11-30 LAB — GLUCOSE, CAPILLARY
GLUCOSE-CAPILLARY: 110 mg/dL — AB (ref 70–99)
Glucose-Capillary: 74 mg/dL (ref 70–99)
Glucose-Capillary: 89 mg/dL (ref 70–99)
Glucose-Capillary: 95 mg/dL (ref 70–99)

## 2017-11-30 LAB — CBC WITH DIFFERENTIAL/PLATELET
Abs Immature Granulocytes: 0.01 10*3/uL (ref 0.00–0.07)
BASOS PCT: 1 %
Basophils Absolute: 0 10*3/uL (ref 0.0–0.1)
EOS ABS: 0.1 10*3/uL (ref 0.0–0.5)
Eosinophils Relative: 1 %
HCT: 37.7 % — ABNORMAL LOW (ref 39.0–52.0)
Hemoglobin: 11.1 g/dL — ABNORMAL LOW (ref 13.0–17.0)
IMMATURE GRANULOCYTES: 0 %
Lymphocytes Relative: 10 %
Lymphs Abs: 0.4 10*3/uL — ABNORMAL LOW (ref 0.7–4.0)
MCH: 31.4 pg (ref 26.0–34.0)
MCHC: 29.4 g/dL — ABNORMAL LOW (ref 30.0–36.0)
MCV: 106.8 fL — ABNORMAL HIGH (ref 80.0–100.0)
Monocytes Absolute: 0.6 10*3/uL (ref 0.1–1.0)
Monocytes Relative: 13 %
NEUTROS ABS: 3.3 10*3/uL (ref 1.7–7.7)
NEUTROS PCT: 75 %
NRBC: 0 % (ref 0.0–0.2)
PLATELETS: 147 10*3/uL — AB (ref 150–400)
RBC: 3.53 MIL/uL — AB (ref 4.22–5.81)
RDW: 18.4 % — AB (ref 11.5–15.5)
WBC: 4.4 10*3/uL (ref 4.0–10.5)

## 2017-11-30 LAB — MAGNESIUM: MAGNESIUM: 2.7 mg/dL — AB (ref 1.7–2.4)

## 2017-11-30 LAB — PROTIME-INR
INR: 2.15
Prothrombin Time: 23.8 seconds — ABNORMAL HIGH (ref 11.4–15.2)

## 2017-11-30 LAB — LACTIC ACID, PLASMA: Lactic Acid, Venous: 1.1 mmol/L (ref 0.5–1.9)

## 2017-11-30 MED ORDER — WARFARIN SODIUM 2.5 MG PO TABS
2.5000 mg | ORAL_TABLET | Freq: Once | ORAL | Status: AC
  Administered 2017-11-30: 2.5 mg via ORAL
  Filled 2017-11-30: qty 1

## 2017-11-30 MED ORDER — LACTULOSE 10 GM/15ML PO SOLN
30.0000 g | Freq: Three times a day (TID) | ORAL | Status: DC
  Administered 2017-11-30 (×3): 30 g via ORAL
  Filled 2017-11-30 (×3): qty 45

## 2017-11-30 NOTE — Progress Notes (Signed)
ANTICOAGULATION CONSULT NOTE - Follow-Up Consult  Pharmacy Consult for Coumadin Indication: Afib  No Known Allergies  Patient Measurements: Height: '5\' 7"'  (170.2 cm) Weight: 237 lb 3.2 oz (107.6 kg) IBW/kg (Calculated) : 66.1  Vital Signs: Temp: 98.2 F (36.8 C) (10/26 0312) Temp Source: Axillary (10/26 0312) BP: 125/69 (10/26 0846) Pulse Rate: 86 (10/26 0305)  Labs: Recent Labs    11/28/17 0246 11/29/17 0317 11/30/17 0303  HGB 11.1* 11.2* 11.1*  HCT 38.8* 39.7 37.7*  PLT 144* 153 147*  LABPROT 21.7* 22.9* 23.8*  INR 1.92 2.05 2.15  CREATININE 2.72* 2.83* 2.74*    Estimated Creatinine Clearance: 26 mL/min (A) (by C-G formula based on SCr of 2.74 mg/dL (H)).   Medical History: Past Medical History:  Diagnosis Date  . Acute kidney injury superimposed on chronic kidney disease ([place]) 11/2016  . Acute respiratory failure ([place]) 11/2016  . Allergy   . Asthma   . Atrial fibrillation ([place])   . 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 Benign essential hypertension   . CHF (congestive heart failure) ([place])   . CKD (chronic kidney disease), stage III ([place])   . COPD (chronic obstructive pulmonary disease) ([place])   . Diabetes ([place])   . Diabetes mellitus without complication ([place])   . Dyspnea   . Gastritis   . GERD (gastroesophageal reflux disease)   . Gout attack 09/2012  . Hypertension   . Sinusitis, maxillary, chronic   . Thyroid disease   . Type II or unspecified type diabetes mellitus without mention of complication, not stated as uncontrolled     Medications:  Medications Prior to Admission  Medication Sig Dispense Refill Last Dose  . acetaminophen (TYLENOL) 325 MG tablet Take 650 mg by mouth every 6 (six) hours as needed for mild pain.   Past Week at Unknown time  . albuterol (PROVENTIL) (2.5 MG/3ML) 0.083% nebulizer solution INHALE CONTENTS OF 1 VIAL IN NEBULIZER EVERY 4 HOURS AS NEEDED FOR WHEEZING AND ASTHMA (Patient taking differently: Take  2.5 mg by nebulization every 4 (four) hours as needed for wheezing. ) 75 mL 4 11/23/2017 at Unknown time  . allopurinol (ZYLOPRIM) 300 MG tablet TAKE 1 TABLET BY MOUTH EVERY DAY (Patient taking differently: Take 300 mg by mouth daily. ) 30 tablet 6 11/23/2017 at Unknown time  . calcitRIOL (ROCALTROL) 0.25 MCG capsule Take 0.25 mcg by mouth daily.    11/23/2017 at Unknown time  . isosorbide mononitrate (IMDUR) 30 MG 24 hr tablet Take 1 tablet (30 mg total) by mouth daily. 30 tablet 3 11/23/2017 at Unknown time  . KLOR-CON M20 20 MEQ tablet TAKE 1 TABLET BY MOUTH EVERY DAY (Patient taking differently: Take 20 mEq by mouth daily. ) 30 tablet 1 11/23/2017 at Unknown time  . levothyroxine (SYNTHROID, LEVOTHROID) 175 MCG tablet TAKE 1 TABLET EVERY DAY BEFORE BREAKFAST (Patient taking differently: Take 175 mcg by mouth daily before breakfast. ) 30 tablet 3 11/23/2017 at Unknown time  . Multiple Vitamins-Minerals (CENTRUM SILVER ADULT 50+ PO) Take 1 tablet by mouth daily.   11/23/2017 at Unknown time  . pantoprazole (PROTONIX) 40 MG tablet TAKE 1 TABLET BY MOUTH EVERY DAY BEFORE SUPPER (Patient taking differently: Take 40 mg by mouth daily. ) 90 tablet 0 11/23/2017 at Unknown time  . rosuvastatin (CRESTOR) 20 MG tablet Take 1 tablet (20 mg total) by mouth daily. 30 tablet 3 11/23/2017 at Unknown time  . torsemide (DEMADEX) 20 MG tablet TAKE 1 TABLET TWICE A DAY (  Patient taking differently: Take 20 mg by mouth 2 (two) times daily. ) 60 tablet 3 11/23/2017 at Unknown time  . umeclidinium-vilanterol (ANORO ELLIPTA) 62.5-25 MCG/INH AEPB Inhale 1 puff into the lungs daily. 30 each 11 11/23/2017 at Unknown time  . warfarin (COUMADIN) 5 MG tablet TAKE 1/2 TO 1 TABLET BY MOUTH DAILY AS DIRECTED BY COUMADIN CLINIC (Patient taking differently: Take 5 mg by mouth every evening. ) 30 tablet 0 11/23/2017 at 2130  . SUPREP BOWEL PREP KIT 17.5-3.13-1.6 GM/177ML SOLN Take 1 kit by mouth as directed. For colonoscopy prep  (Patient not taking: Reported on 11/24/2017) 2 Bottle 0 Completed Course at Unknown time  . UNABLE TO FIND CPAP  Unknown DME- prescribed by doc in [facility]   Taking   Scheduled:  . calcitRIOL  0.25 mcg Oral Daily  . lactulose  30 g Oral TID  . levothyroxine  175 mcg Oral QAC breakfast  . mouth rinse  15 mL Mouth Rinse BID  . metoprolol succinate  12.5 mg Oral Daily  . multivitamin with minerals  1 tablet Oral Daily  . pantoprazole  40 mg Oral Daily  . rosuvastatin  20 mg Oral Daily  . sodium chloride flush  3 mL Intravenous Q12H  . umeclidinium-vilanterol  1 puff Inhalation Daily  . Warfarin - Pharmacist Dosing Inpatient   Does not apply q1800   Infusions:  . sodium chloride Stopped (11/24/17 0700)    Assessment: 77 yo male c/o worsening SOB over 2wk, admitted for PNA and completed a course of antibiotics.  Pt to continue Coumadin for Afib.  PTA dose = 2.69m daily except 52mon Wed/Fri.  INR now therapeutic.  Goal of Therapy:  INR 2-3   Plan:  Coumadin 2.24m32mO x1 Daily INR  MicLegrand Comoharm.D., BCPS, BCIDP Clinical Pharmacist Phone: [phone] check AMION for all [facility] 11/30/2017, 9:33 AM

## 2017-11-30 NOTE — Progress Notes (Signed)
[facility] Progress Note  Patient: Omar Lambert YBW:[id number]   PCP: Gayland Curry, DO DOB: 03/05/1940   DOA: 11/24/2017   DOS: 11/30/2017   Date of Service: the patient was seen and examined on 11/30/2017  Brief hospital course: Pt. with PMH of type II DM, COPD, hypothyroidism, HTN, CKD stage III, PAF,; admitted on 11/24/2017, presented with complaint of shortness of breath, was found to have acute hypoxic respiratory failure secondary to acute on chronic diastolic CHF with suspected pneumonia.  Patient was given IV antibiotics as well as aggressively diuresed with IV Lasix Currently further plan is continue respiratory support with BiPAP.  Subjective: Significantly better mentation.  No nausea no vomiting.  No abdominal pain.  No BM.  Assessment and Plan: 1.  Acute hypoxic and hypercarbic respiratory failure. Acute on chronic diastolic CHF. Pulmonary hypertension. OSA-noncompliant with CPAP. COPD exacerbation  Presents with shortness of breath, was hypoxic initially on 2 L of oxygen now. Last echocardiogram shows 50% EF, moderate pulmonary hypertension. Baseline weight is 101 kg currently running around 105 kg. Received IV Lasix with renal function worsening. Received IV antibiotics for concern for pneumonia-culture negative completed the antibiotic course so far. Unna boots were placed. On 11/28/2017 patient becomes more lethargic, ABG showed severe hypercarbia with respiratory acidosis. Tolerated BiPAP very well. No significant improvement in mentation. Will change BiPAP to as needed and switch to CPAP overnight on scheduled basis. Continue nebulizers. pt was using and benefiting from his CPAP at home but needs a new one as the old one keeps cutting off  CM to FU on this Last CPAP study was in 2010 at which time auto CPAP was recommended.  2.  Acute on chronic kidney disease stage IV. Hyperkalemia Baseline renal function is 2.3-2.5.  Currently running around  2.7. Also has hyperkalemia. We will give Kayexalate. Monitor BMP. Continue to monitor on telemetry. Holding diuresis for now.  3.  Elevated troponin. Mild associated with chronic kidney disease as well as demand ischemia. No evidence of ACS.  4.  Paroxysmal A. fib. Currently rate controlled. Continue Coumadin.  5. Type 2 Diabetes Mellitus, uncontroled with hyperglycemia and renal complication Diet controlled. last hemoglobin A1c was 6.2  6. Anemia of chronic kidney disease Hemoglobin stable. Monitor.  7.  Morbid obesity  Body mass index is 37.15 kg/m.  Dietary consultation  8. Hypothyroidism -Continue Synthroid  9.  Acute metabolic encephalopathy. From hypercarbia. As well as ammonemia No evidence of liver dysfunction. We will use scheduled lactulose for now. Asterixis have resolved.  Diet: cardiac diet DVT Prophylaxis: on therapeutic anticoagulation.  Advance goals of care discussion: full code  Family Communication: family was present at bedside, at the time of interview. The pt provided permission to discuss medical plan with the family. Opportunity was given to ask question and all questions were answered satisfactorily.   Disposition:  Discharge to be determined.  Consultants: none Procedures: noen  Scheduled Meds: . calcitRIOL  0.25 mcg Oral Daily  . lactulose  30 g Oral TID  . levothyroxine  175 mcg Oral QAC breakfast  . mouth rinse  15 mL Mouth Rinse BID  . metoprolol succinate  12.5 mg Oral Daily  . multivitamin with minerals  1 tablet Oral Daily  . pantoprazole  40 mg Oral Daily  . rosuvastatin  20 mg Oral Daily  . sodium chloride flush  3 mL Intravenous Q12H  . umeclidinium-vilanterol  1 puff Inhalation Daily  . warfarin  2.5 mg Oral ONCE-1800  .  Warfarin - Pharmacist Dosing Inpatient   Does not apply q1800   Continuous Infusions: . sodium chloride Stopped (11/24/17 0700)   PRN Meds: sodium chloride, acetaminophen, albuterol, sodium  chloride flush Antibiotics: Anti-infectives (From admission, onward)   Start     Dose/Rate Route Frequency Ordered Stop   11/28/17 1000  cefdinir (OMNICEF) capsule 300 mg  Status:  Discontinued     300 mg Oral Daily 11/27/17 1412 11/28/17 1222   11/26/17 0600  vancomycin (VANCOCIN) 1,500 mg in sodium chloride 0.9 % 500 mL IVPB  Status:  Discontinued     1,500 mg 250 mL/hr over 120 Minutes Intravenous Every 48 hours 11/24/17 0615 11/24/17 0852   11/25/17 1800  cefdinir (OMNICEF) capsule 300 mg  Status:  Discontinued     300 mg Oral Every 12 hours 11/25/17 1142 11/27/17 1412   11/24/17 0600  ceFEPIme (MAXIPIME) 1 g in sodium chloride 0.9 % 100 mL IVPB  Status:  Discontinued     1 g 200 mL/hr over 30 Minutes Intravenous Every 8 hours 11/24/17 0526 11/24/17 0532   11/24/17 0600  ceFEPIme (MAXIPIME) 1 g in sodium chloride 0.9 % 100 mL IVPB  Status:  Discontinued     1 g 200 mL/hr over 30 Minutes Intravenous Every 24 hours 11/24/17 0532 11/25/17 1142   11/24/17 0545  vancomycin (VANCOCIN) 2,000 mg in sodium chloride 0.9 % 500 mL IVPB     2,000 mg 250 mL/hr over 120 Minutes Intravenous  Once 11/24/17 0534 11/24/17 0700   11/24/17 0200  cefTRIAXone (ROCEPHIN) 1 g in sodium chloride 0.9 % 100 mL IVPB     1 g 200 mL/hr over 30 Minutes Intravenous  Once 11/24/17 0153 11/24/17 0237   11/24/17 0200  azithromycin (ZITHROMAX) 500 mg in sodium chloride 0.9 % 250 mL IVPB     500 mg 250 mL/hr over 60 Minutes Intravenous  Once 11/24/17 0153 11/24/17 0354       Objective: Physical Exam: Vitals:   11/30/17 0312 11/30/17 0746 11/30/17 0846 11/30/17 1221  BP: 129/74  125/69 (!) 135/106  Pulse:      Resp: (!) 35  17 19  Temp: 98.2 F (36.8 C)     TempSrc: Axillary     SpO2: 94% 91% 96% 97%  Weight:      Height:       No intake or output data in the 24 hours ending 11/30/17 1433 Filed Weights   11/27/17 0303 11/28/17 0448 11/29/17 0615  Weight: 107 kg 106.6 kg 107.6 kg   General: Alert, Awake  and Oriented to person, place . Appear in marked distress, affect difficult to assess Eyes: PERRL, Conjunctiva normal ENT: Oral Mucosa clear moist. Neck: difficult to assess  JVD, no Abnormal Mass Or lumps Cardiovascular: S1 and S2 Present, no Murmur, Peripheral Pulses Present Respiratory: increased respiratory effort, Bilateral Air entry equal and Decreased, no use of accessory muscle, bilateral  Crackles, no wheezes Abdomen: Bowel Sound present, Soft and no tenderness, no hernia Skin: no redness, no Rash, no induration Extremities: bilateral  Pedal edema, no calf tenderness Neurologic: Grossly no focal neuro deficit. Bilaterally Equal motor strength  Data Reviewed: CBC: Recent Labs  Lab 11/24/17 0041 11/25/17 0302 11/28/17 0246 11/29/17 0317 11/30/17 0303  WBC 3.9* 3.9* 3.9* 4.8 4.4  NEUTROABS  --   --   --   --  3.3  HGB 11.3* 10.4* 11.1* 11.2* 11.1*  HCT 39.0 36.9* 38.8* 39.7 37.7*  MCV 107.1* 107.3* 107.5* 107.9* 106.8*  PLT 157 150 144* 153 010*   Basic Metabolic Panel: Recent Labs  Lab 11/26/17 0812 11/27/17 0255 11/28/17 0246 11/29/17 0317 11/30/17 0303  NA 141 140 139 140 140  K 4.7 4.8 4.7 5.6* 4.8  CL 99 95* 96* 95* 97*  CO2 35* 34* 37* 34* 33*  GLUCOSE 87 90 105* 85 89  BUN 34* 39* 42* 47* 49*  CREATININE 2.51* 2.63* 2.72* 2.83* 2.74*  CALCIUM 9.0 9.4 9.3 9.7 9.2  MG  --  2.3 2.4  --  2.7*    Liver Function Tests: Recent Labs  Lab 11/29/17 0317 11/30/17 0303  AST 43* 42*  ALT 24 22  ALKPHOS 194* 171*  BILITOT 1.2 1.5*  PROT 7.8 7.4  ALBUMIN 3.6 3.5   No results for input(s): LIPASE, AMYLASE in the last 168 hours. Recent Labs  Lab 11/29/17 1425  AMMONIA 92*   Coagulation Profile: Recent Labs  Lab 11/26/17 0217 11/27/17 0255 11/28/17 0246 11/29/17 0317 11/30/17 0303  INR 1.68 1.73 1.92 2.05 2.15   Cardiac Enzymes: Recent Labs  Lab 11/24/17 0546  CKTOTAL 287  CKMB 6.0*  TROPONINI 0.08*   BNP (last 3 results) No results for  input(s): PROBNP in the last 8760 hours. CBG: Recent Labs  Lab 11/27/17 2128 11/28/17 0723 11/29/17 2329 11/30/17 0817 11/30/17 1220  GLUCAP 114* 107* 115* 74 89   Studies: No results found.   Time spent: 35 minutes  Author: Berle Mull, MD [facility] Pager: [phone] 11/30/2017 2:33 PM  Between 7PM-7AM, please contact night-coverage at [email or web address], password [facility]

## 2017-12-01 DIAGNOSIS — I2781 Cor pulmonale (chronic): Secondary | ICD-10-CM

## 2017-12-01 DIAGNOSIS — J9612 Chronic respiratory failure with hypercapnia: Secondary | ICD-10-CM

## 2017-12-01 DIAGNOSIS — J9611 Chronic respiratory failure with hypoxia: Secondary | ICD-10-CM

## 2017-12-01 DIAGNOSIS — J441 Chronic obstructive pulmonary disease with (acute) exacerbation: Secondary | ICD-10-CM

## 2017-12-01 LAB — BASIC METABOLIC PANEL
Anion gap: 10 (ref 5–15)
BUN: 46 mg/dL — ABNORMAL HIGH (ref 8–23)
CALCIUM: 9.4 mg/dL (ref 8.9–10.3)
CO2: 33 mmol/L — AB (ref 22–32)
CREATININE: 2.63 mg/dL — AB (ref 0.61–1.24)
Chloride: 98 mmol/L (ref 98–111)
GFR calc Af Amer: 25 mL/min — ABNORMAL LOW (ref >60)
GFR calc non Af Amer: 22 mL/min — ABNORMAL LOW (ref >60)
GLUCOSE: 87 mg/dL (ref 70–99)
Potassium: 4.2 mmol/L (ref 3.5–5.1)
Sodium: 141 mmol/L (ref 135–145)

## 2017-12-01 LAB — BLOOD GAS, ARTERIAL
Acid-Base Excess: 11.2 mmol/L — ABNORMAL HIGH (ref 0.0–2.0)
Bicarbonate: 38 mmol/L — ABNORMAL HIGH (ref 20.0–28.0)
DRAWN BY: 244801
O2 CONTENT: 2 L/min
O2 SAT: 92 %
PATIENT TEMPERATURE: 98.6
pCO2 arterial: 82.9 mmHg (ref 32.0–48.0)
pH, Arterial: 7.283 — ABNORMAL LOW (ref 7.350–7.450)
pO2, Arterial: 71.4 mmHg — ABNORMAL LOW (ref 83.0–108.0)

## 2017-12-01 LAB — CBC
HEMATOCRIT: 37.7 % — AB (ref 39.0–52.0)
HEMOGLOBIN: 10.9 g/dL — AB (ref 13.0–17.0)
MCH: 31.1 pg (ref 26.0–34.0)
MCHC: 28.9 g/dL — ABNORMAL LOW (ref 30.0–36.0)
MCV: 107.4 fL — ABNORMAL HIGH (ref 80.0–100.0)
NRBC: 0 % (ref 0.0–0.2)
Platelets: 147 10*3/uL — ABNORMAL LOW (ref 150–400)
RBC: 3.51 MIL/uL — ABNORMAL LOW (ref 4.22–5.81)
RDW: 18.4 % — ABNORMAL HIGH (ref 11.5–15.5)
WBC: 4.3 10*3/uL (ref 4.0–10.5)

## 2017-12-01 LAB — GLUCOSE, CAPILLARY
GLUCOSE-CAPILLARY: 79 mg/dL (ref 70–99)
GLUCOSE-CAPILLARY: 73 mg/dL (ref 70–99)
GLUCOSE-CAPILLARY: 116 mg/dL — AB (ref 70–99)
Glucose-Capillary: 103 mg/dL — ABNORMAL HIGH (ref 70–99)

## 2017-12-01 LAB — PROTIME-INR
INR: 2.33
PROTHROMBIN TIME: 25.2 s — AB (ref 11.4–15.2)

## 2017-12-01 LAB — AMMONIA: Ammonia: 68 umol/L — ABNORMAL HIGH (ref 9–35)

## 2017-12-01 MED ORDER — IPRATROPIUM-ALBUTEROL 0.5-2.5 (3) MG/3ML IN SOLN
3.0000 mL | Freq: Four times a day (QID) | RESPIRATORY_TRACT | Status: DC
  Administered 2017-12-01 – 2017-12-02 (×4): 3 mL via RESPIRATORY_TRACT
  Filled 2017-12-01 (×4): qty 3

## 2017-12-01 MED ORDER — WARFARIN SODIUM 2.5 MG PO TABS
2.5000 mg | ORAL_TABLET | Freq: Once | ORAL | Status: AC
  Administered 2017-12-01: 2.5 mg via ORAL
  Filled 2017-12-01: qty 1

## 2017-12-01 MED ORDER — LACTULOSE 10 GM/15ML PO SOLN
10.0000 g | Freq: Every day | ORAL | Status: DC
  Administered 2017-12-01: 10 g via ORAL
  Filled 2017-12-01: qty 15

## 2017-12-01 MED ORDER — LACTULOSE 10 GM/15ML PO SOLN
20.0000 g | Freq: Every day | ORAL | Status: DC
  Administered 2017-12-02: 20 g via ORAL

## 2017-12-01 MED ORDER — PANTOPRAZOLE SODIUM 40 MG PO TBEC
40.0000 mg | DELAYED_RELEASE_TABLET | Freq: Two times a day (BID) | ORAL | Status: DC
  Administered 2017-12-02 – 2017-12-06 (×9): 40 mg via ORAL
  Filled 2017-12-01 (×9): qty 1

## 2017-12-01 MED ORDER — LACTULOSE 10 GM/15ML PO SOLN
20.0000 g | Freq: Three times a day (TID) | ORAL | Status: AC
  Administered 2017-12-01 (×3): 20 g via ORAL
  Filled 2017-12-01 (×3): qty 30

## 2017-12-01 MED ORDER — HALOPERIDOL LACTATE 5 MG/ML IJ SOLN
2.0000 mg | Freq: Once | INTRAMUSCULAR | Status: AC
  Administered 2017-12-01: 2 mg via INTRAVENOUS
  Filled 2017-12-01: qty 1

## 2017-12-01 NOTE — Assessment & Plan Note (Addendum)
Note that pleural effusion and copd have the same effect on insp muscles/mechanics (both shorten their length prior to inspiration making them weaker with less force reserve) so they are synergistic in causing sob.   >>> rec tap dry for dx/ therapeutic purposes

## 2017-12-01 NOTE — Plan of Care (Signed)
Pt without s/s of pain and denies pain.

## 2017-12-01 NOTE — Assessment & Plan Note (Addendum)
  When respiratory symptoms begin or become refractory well after a patient reports complete smoking cessation,  Especially when this wasn't the case while they were smoking, a red flag is raised based on the work of Dr Kris Mouton which states:  if you quit smoking when your best day FEV1 is still well preserved it is highly unlikely you will progress to severe disease.  That is to say, once the smoking stops,  the symptoms should not suddenly erupt or markedly worsen.  If so, the differential diagnosis should include  obesity/deconditioning,  LPR/Reflux/Aspiration syndromes,  occult CHF esp if assoc with pleural effusion (see below) , or  especially side effect of medications commonly used in this population ( of which I don't see any examples on his list)  Clearly wt gain has been the main problem here, not progressive airflow obst related to copd, based on the most recent pfts.   Howevere, he could have AB component though wonder how much it may be driven by chf or gerd (esp given how tensely obese he has become)   >>>  For now rec duoneb qid, max gerd rx,  and address the R effusion (see separate a/p)

## 2017-12-01 NOTE — Progress Notes (Signed)
CRITICAL VALUE ALERT  Critical Value:  ABG Results PH 7.28, pCO2 82.9, pO2 71, Bicarb 38  Date & Time Notied: 12/01/17 0800  Provider Notified: Marlowe Sax  Orders Received/Actions taken: Placing pt on Bipap now.

## 2017-12-01 NOTE — Progress Notes (Signed)
ANTICOAGULATION CONSULT NOTE - Follow-Up Consult  Pharmacy Consult for Coumadin Indication: Afib  No Known Allergies  Patient Measurements: Height: _0  (170.2 cm) Weight: 232 lb 2.3 oz (105.3 kg) IBW/kg (Calculated) : 66.1  Vital Signs: Temp: 98.1 F (36.7 C) (10/27 0353) Temp Source: Axillary (10/27 0353) BP: 133/79 (10/27 0802) Pulse Rate: 83 (10/27 0802)  Labs: Recent Labs    11/29/17 0317 11/30/17 0303 12/01/17 0309  HGB 11.2* 11.1* 10.9*  HCT 39.7 37.7* 37.7*  PLT 153 147* 147*  LABPROT 22.9* 23.8* 25.2*  INR 2.05 2.15 2.33  CREATININE 2.83* 2.74* 2.63*    Estimated Creatinine Clearance: 26.8 mL/min (A) (by C-G formula based on SCr of 2.63 mg/dL (H)).   Medical History: Past Medical History:  Diagnosis Date  . Acute kidney injury superimposed on chronic kidney disease ([place]) 11/2016  . Acute respiratory failure ([place]) 11/2016  . Allergy   . Asthma   . Atrial fibrillation ([place])   . 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 Benign essential hypertension   . CHF (congestive heart failure) ([place])   . CKD (chronic kidney disease), stage III ([place])   . COPD (chronic obstructive pulmonary disease) ([place])   . Diabetes ([place])   . Diabetes mellitus without complication ([place])   . Dyspnea   . Gastritis   . GERD (gastroesophageal reflux disease)   . Gout attack 09/2012  . Hypertension   . Sinusitis, maxillary, chronic   . Thyroid disease   . Type II or unspecified type diabetes mellitus without mention of complication, not stated as uncontrolled     Medications:  Medications Prior to Admission  Medication Sig Dispense Refill Last Dose  . acetaminophen (TYLENOL) 325 MG tablet Take 650 mg by mouth every 6 (six) hours as needed for mild pain.   Past Week at Unknown time  . albuterol (PROVENTIL) (2.5 MG/3ML) 0.083% nebulizer solution INHALE CONTENTS OF 1 VIAL IN NEBULIZER EVERY 4 HOURS AS NEEDED FOR WHEEZING AND ASTHMA (Patient taking differently: Take  2.5 mg by nebulization every 4 (four) hours as needed for wheezing. ) 75 mL 4 11/23/2017 at Unknown time  . allopurinol (ZYLOPRIM) 300 MG tablet TAKE 1 TABLET BY MOUTH EVERY DAY (Patient taking differently: Take 300 mg by mouth daily. ) 30 tablet 6 11/23/2017 at Unknown time  . calcitRIOL (ROCALTROL) 0.25 MCG capsule Take 0.25 mcg by mouth daily.    11/23/2017 at Unknown time  . isosorbide mononitrate (IMDUR) 30 MG 24 hr tablet Take 1 tablet (30 mg total) by mouth daily. 30 tablet 3 11/23/2017 at Unknown time  . KLOR-CON M20 20 MEQ tablet TAKE 1 TABLET BY MOUTH EVERY DAY (Patient taking differently: Take 20 mEq by mouth daily. ) 30 tablet 1 11/23/2017 at Unknown time  . levothyroxine (SYNTHROID, LEVOTHROID) 175 MCG tablet TAKE 1 TABLET EVERY DAY BEFORE BREAKFAST (Patient taking differently: Take 175 mcg by mouth daily before breakfast. ) 30 tablet 3 11/23/2017 at Unknown time  . Multiple Vitamins-Minerals (CENTRUM SILVER ADULT 50+ PO) Take 1 tablet by mouth daily.   11/23/2017 at Unknown time  . pantoprazole (PROTONIX) 40 MG tablet TAKE 1 TABLET BY MOUTH EVERY DAY BEFORE SUPPER (Patient taking differently: Take 40 mg by mouth daily. ) 90 tablet 0 11/23/2017 at Unknown time  . rosuvastatin (CRESTOR) 20 MG tablet Take 1 tablet (20 mg total) by mouth daily. 30 tablet 3 11/23/2017 at Unknown time  . torsemide (DEMADEX) 20 MG tablet TAKE 1 TABLET TWICE A DAY (  Patient taking differently: Take 20 mg by mouth 2 (two) times daily. ) 60 tablet 3 11/23/2017 at Unknown time  . umeclidinium-vilanterol (ANORO ELLIPTA) 62.5-25 MCG/INH AEPB Inhale 1 puff into the lungs daily. 30 each 11 11/23/2017 at Unknown time  . warfarin (COUMADIN) 5 MG tablet TAKE 1/2 TO 1 TABLET BY MOUTH DAILY AS DIRECTED BY COUMADIN CLINIC (Patient taking differently: Take 5 mg by mouth every evening. ) 30 tablet 0 11/23/2017 at 2130  . SUPREP BOWEL PREP KIT 17.5-3.13-1.6 GM/177ML SOLN Take 1 kit by mouth as directed. For colonoscopy prep  (Patient not taking: Reported on 11/24/2017) 2 Bottle 0 Completed Course at Unknown time  . UNABLE TO FIND CPAP  Unknown DME- prescribed by doc in [facility]   Taking   Scheduled:  . calcitRIOL  0.25 mcg Oral Daily  . lactulose  10 g Oral Daily  . lactulose  20 g Oral TID  . levothyroxine  175 mcg Oral QAC breakfast  . mouth rinse  15 mL Mouth Rinse BID  . metoprolol succinate  12.5 mg Oral Daily  . multivitamin with minerals  1 tablet Oral Daily  . pantoprazole  40 mg Oral Daily  . rosuvastatin  20 mg Oral Daily  . sodium chloride flush  3 mL Intravenous Q12H  . umeclidinium-vilanterol  1 puff Inhalation Daily  . Warfarin - Pharmacist Dosing Inpatient   Does not apply q1800   Infusions:  . sodium chloride Stopped (11/24/17 0700)    Assessment: 77 yo male c/o worsening SOB over 2wk, admitted for PNA and completed a course of antibiotics.  Pt to continue Coumadin for Afib.  PTA dose = 2.63m daily except 578mon Wed/Fri.  INR remains therapeutic today. His Coumadin doses over the past several days have been the same as his home regimen.  Goal of Therapy:  INR 2-3   Plan:  Coumadin 2.61m67mO x1 Daily INR  MicLegrand Comoharm.D., BCPS, BCIDP Clinical Pharmacist Phone: [phone] check AMION for all [facility] 12/01/2017, 10:22 AM

## 2017-12-01 NOTE — Progress Notes (Signed)
[facility] Progress Note  Patient: Omar Lambert [id number]:[id number]   PCP: Gayland Curry, DO DOB: 08/29/1940   DOA: 11/24/2017   DOS: 12/01/2017   Date of Service: the patient was seen and examined on 12/01/2017  Brief hospital course: Pt. with PMH of type II DM, COPD, hypothyroidism, HTN, CKD stage III, PAF,; admitted on 11/24/2017, presented with complaint of shortness of breath, was found to have acute hypoxic respiratory failure secondary to acute on chronic diastolic CHF with suspected pneumonia.  Patient was given IV antibiotics as well as aggressively diuresed with IV Lasix Currently further plan is continue respiratory support with BiPAP.  Subjective: Use the BiPAP off and on last night and in the morning become confused.  No nausea no vomiting no fever no chills.  Assessment and Plan: 1.  Acute hypoxic and hypercarbic respiratory failure. Acute on chronic diastolic CHF. Pulmonary hypertension. OSA-noncompliant with CPAP. COPD exacerbation Right-sided pleural effusion  Presents with shortness of breath, was hypoxic initially on 2 L of oxygen now. Last echocardiogram shows 50% EF, moderate pulmonary hypertension. Baseline weight is 101 kg currently running around 105 kg. Received IV Lasix with renal function worsening. Received IV antibiotics for concern for pneumonia-culture negative completed the antibiotic course so far. Unna boots were placed. On 11/28/2017 patient becomes more lethargic, ABG showed severe hypercarbia with respiratory acidosis.  Exline acidotic again on 12/01/2017, pulmonary consulted. Tolerated BiPAP very well. No significant improvement in mentation. Will change BiPAP to as needed and switch to CPAP overnight on scheduled basis. Continue nebulizers. Pulmonary recommending ultrasound-guided thoracentesis. We will monitor the results.  pt was using and benefiting from his CPAP at home but needs a new one as the old one keeps cutting off  CM to FU  on this Last CPAP study was in 2010 at which time auto CPAP was recommended.  2.  Acute on chronic kidney disease stage IV. Hyperkalemia Baseline renal function is 2.3-2.5.  Currently running around 2.7. Also has hyperkalemia. Resolved with Kayexalate. Monitor BMP. Continue to monitor on telemetry. Holding diuresis for now.  3.  Elevated troponin. Mild associated with chronic kidney disease as well as demand ischemia. No evidence of ACS.  4.  Paroxysmal A. fib. Currently rate controlled. Continue Coumadin.  5. Type 2 Diabetes Mellitus, uncontroled with hyperglycemia and renal complication Diet controlled. last hemoglobin A1c was 6.2  6. Anemia of chronic kidney disease Hemoglobin stable. Monitor.  7.  Morbid obesity  Body mass index is 36.36 kg/m.  Dietary consultation  8. Hypothyroidism -Continue Synthroid  9.  Acute metabolic encephalopathy. From hypercarbia. As well as ammonemia No evidence of liver dysfunction. We will use scheduled lactulose for now. Asterixis have resolved.  Diet: cardiac diet DVT Prophylaxis: on therapeutic anticoagulation.  Advance goals of care discussion: full code  Family Communication: family was present at bedside, at the time of interview. The pt provided permission to discuss medical plan with the family. Opportunity was given to ask question and all questions were answered satisfactorily.   Disposition:  Discharge to be determined.  Consultants: Pulmonary Procedures: BiPAP  Scheduled Meds: . calcitRIOL  0.25 mcg Oral Daily  . ipratropium-albuterol  3 mL Nebulization QID  . lactulose  10 g Oral Daily  . lactulose  20 g Oral TID  . levothyroxine  175 mcg Oral QAC breakfast  . mouth rinse  15 mL Mouth Rinse BID  . metoprolol succinate  12.5 mg Oral Daily  . multivitamin with minerals  1 tablet Oral Daily  . [  START ON 12/02/2017] pantoprazole  40 mg Oral BID AC  . rosuvastatin  20 mg Oral Daily  . sodium chloride flush  3  mL Intravenous Q12H  . warfarin  2.5 mg Oral ONCE-1800  . Warfarin - Pharmacist Dosing Inpatient   Does not apply q1800   Continuous Infusions: . sodium chloride Stopped (11/24/17 0700)   PRN Meds: sodium chloride, acetaminophen, albuterol, sodium chloride flush Antibiotics: Anti-infectives (From admission, onward)   Start     Dose/Rate Route Frequency Ordered Stop   11/28/17 1000  cefdinir (OMNICEF) capsule 300 mg  Status:  Discontinued     300 mg Oral Daily 11/27/17 1412 11/28/17 1222   11/26/17 0600  vancomycin (VANCOCIN) 1,500 mg in sodium chloride 0.9 % 500 mL IVPB  Status:  Discontinued     1,500 mg 250 mL/hr over 120 Minutes Intravenous Every 48 hours 11/24/17 0615 11/24/17 0852   11/25/17 1800  cefdinir (OMNICEF) capsule 300 mg  Status:  Discontinued     300 mg Oral Every 12 hours 11/25/17 1142 11/27/17 1412   11/24/17 0600  ceFEPIme (MAXIPIME) 1 g in sodium chloride 0.9 % 100 mL IVPB  Status:  Discontinued     1 g 200 mL/hr over 30 Minutes Intravenous Every 8 hours 11/24/17 0526 11/24/17 0532   11/24/17 0600  ceFEPIme (MAXIPIME) 1 g in sodium chloride 0.9 % 100 mL IVPB  Status:  Discontinued     1 g 200 mL/hr over 30 Minutes Intravenous Every 24 hours 11/24/17 0532 11/25/17 1142   11/24/17 0545  vancomycin (VANCOCIN) 2,000 mg in sodium chloride 0.9 % 500 mL IVPB     2,000 mg 250 mL/hr over 120 Minutes Intravenous  Once 11/24/17 0534 11/24/17 0700   11/24/17 0200  cefTRIAXone (ROCEPHIN) 1 g in sodium chloride 0.9 % 100 mL IVPB     1 g 200 mL/hr over 30 Minutes Intravenous  Once 11/24/17 0153 11/24/17 0237   11/24/17 0200  azithromycin (ZITHROMAX) 500 mg in sodium chloride 0.9 % 250 mL IVPB     500 mg 250 mL/hr over 60 Minutes Intravenous  Once 11/24/17 0153 11/24/17 0354       Objective: Physical Exam: Vitals:   12/01/17 0500 12/01/17 0802 12/01/17 1139 12/01/17 1511  BP:  133/79    Pulse:  83 88   Resp:  (!) 33 (!) 35   Temp:      TempSrc:      SpO2:  98% 100%  100%  Weight: 105.3 kg     Height:       No intake or output data in the 24 hours ending 12/01/17 1522 Filed Weights   11/28/17 0448 11/29/17 0615 12/01/17 0500  Weight: 106.6 kg 107.6 kg 105.3 kg   General: Alert, Awake and Oriented to person, place . Appear in marked distress, affect difficult to assess Eyes: PERRL, Conjunctiva normal ENT: Oral Mucosa clear moist. Neck: difficult to assess  JVD, no Abnormal Mass Or lumps Cardiovascular: S1 and S2 Present, no Murmur, Peripheral Pulses Present Respiratory: increased respiratory effort, Bilateral Air entry equal and Decreased, no use of accessory muscle, bilateral  Crackles, no wheezes Abdomen: Bowel Sound present, Soft and no tenderness, no hernia Skin: no redness, no Rash, no induration Extremities: bilateral  Pedal edema, no calf tenderness Neurologic: Grossly no focal neuro deficit. Bilaterally Equal motor strength  Data Reviewed: CBC: Recent Labs  Lab 11/25/17 0302 11/28/17 0246 11/29/17 0317 11/30/17 0303 12/01/17 0309  WBC 3.9* 3.9* 4.8 4.4 4.3  NEUTROABS  --   --   --  3.3  --   HGB 10.4* 11.1* 11.2* 11.1* 10.9*  HCT 36.9* 38.8* 39.7 37.7* 37.7*  MCV 107.3* 107.5* 107.9* 106.8* 107.4*  PLT 150 144* 153 147* 953*   Basic Metabolic Panel: Recent Labs  Lab 11/27/17 0255 11/28/17 0246 11/29/17 0317 11/30/17 0303 12/01/17 0309  NA 140 139 140 140 141  K 4.8 4.7 5.6* 4.8 4.2  CL 95* 96* 95* 97* 98  CO2 34* 37* 34* 33* 33*  GLUCOSE 90 105* 85 89 87  BUN 39* 42* 47* 49* 46*  CREATININE 2.63* 2.72* 2.83* 2.74* 2.63*  CALCIUM 9.4 9.3 9.7 9.2 9.4  MG 2.3 2.4  --  2.7*  --     Liver Function Tests: Recent Labs  Lab 11/29/17 0317 11/30/17 0303  AST 43* 42*  ALT 24 22  ALKPHOS 194* 171*  BILITOT 1.2 1.5*  PROT 7.8 7.4  ALBUMIN 3.6 3.5   No results for input(s): LIPASE, AMYLASE in the last 168 hours. Recent Labs  Lab 11/29/17 1425 12/01/17 0309  AMMONIA 92* 68*   Coagulation Profile: Recent Labs    Lab 11/27/17 0255 11/28/17 0246 11/29/17 0317 11/30/17 0303 12/01/17 0309  INR 1.73 1.92 2.05 2.15 2.33   Cardiac Enzymes: No results for input(s): CKTOTAL, CKMB, CKMBINDEX, TROPONINI in the last 168 hours. BNP (last 3 results) No results for input(s): PROBNP in the last 8760 hours. CBG: Recent Labs  Lab 11/30/17 1220 11/30/17 1644 11/30/17 2110 12/01/17 0813 12/01/17 1224  GLUCAP 89 95 110* 79 73   Studies: No results found.   Time spent: 35 minutes  Author: Berle Mull, MD [facility] Pager: [phone] 12/01/2017 3:22 PM  Between 7PM-7AM, please contact night-coverage at [email or web address], password [facility]

## 2017-12-01 NOTE — Assessment & Plan Note (Addendum)
HC03   08/25/17  = 31  Whereas had been as high as 37 earlier in July 2019  09/10/2017   Walked RA x one lap @ 185 stopped due to  desats to mid 80's declined amb 02 -  ONO RA on cpap 09/10/2017 > not done so repeated 10/10/2017 > not completed - HOC3 up to 33 12/01/17   >>>  He might be better off with NIV at this point but because he already has cpap per [name] and failing it will ask one of our sleep medicine specialists for a second opion and to see if we can help with compliance issues that seem to be holding him back

## 2017-12-01 NOTE — Consult Note (Signed)
11/24/2017  Pulmonary consultation / Melvyn Novas Chief Complaint  Patient presents with  . Shortness of Breath   77 yobm quit smoking 1999 at wt 200 lb and followed in pulmonary office since 11/2012 with h/o progressive wt gain and min copd but significant restrictive changes on pfts and osa followed by Dr Ellouise Newer  - trial of Breo 11/25/12 > improved 12/25/2012  - PFTs 12/25/2012 FEV1  1.44 (58%) ratio 69 and no change p B2 p last Breo am of pft's  - Desats on walking noted 09/10/2017 off breo > see resp failure  - 09/10/2017  After extensive coaching inhaler device  effectiveness =  90% with Elipta  >  try anoro one click each am - PFT's  10/10/2017  FEV1 1.00 (42 % ) ratio 81  p no % improvement from saba p anoro prior to study with DLCO  46/53c % corrects to 108  % for alv volume    Last seen in office 10/10/17 confused with details of care and did not bring meds as req  rec - recheck 0NO on RA  Continue cpap > apparently neither done    Readmit 11/24/17 with worse sob x 2 weeks (did not contact my offie ) assoc with cough with brown mucus but has done poorly with admit / rx for copd and hypercarbic despite bipap though compliance has been an issue.    No obvious day to day or daytime variability or assoc ongoing  excess/ purulent sputum or mucus plugs or hemoptysis or cp or chest tightness, subjective wheeze or overt sinus or hb symptoms.     Also denies any obvious fluctuation of symptoms with weather or environmental changes or other aggravating or alleviating factors except as outlined above   No unusual exposure hx or h/o childhood pna/ asthma or knowledge of premature birth.  Current Allergies, Complete Past Medical History, Past Surgical History, Family History, and Social History were reviewed in [employer] record.  ROS  The following are not active complaints unless bolded Hoarseness, sore throat, dysphagia, dental problems, itching, sneezing,  nasal congestion or  discharge of excess mucus or purulent secretions, ear ache,   fever, chills, sweats, unintended wt loss or wt gain, classically pleuritic or exertional cp,  orthopnea pnd or arm/hand swelling  or leg swelling, presyncope, palpitations, abdominal pain, anorexia, nausea, vomiting, diarrhea  or change in bowel habits or change in bladder habits, change in stools or change in urine, dysuria, hematuria,  rash, arthralgias, visual complaints, headache, numbness, weakness or ataxia or problems with walking or coordination,  change in mood or  memory.        Current Meds  Medication Sig  . acetaminophen (TYLENOL) 325 MG tablet Take 650 mg by mouth every 6 (six) hours as needed for mild pain.  Marland Kitchen albuterol (PROVENTIL) (2.5 MG/3ML) 0.083% nebulizer solution INHALE CONTENTS OF 1 VIAL IN NEBULIZER EVERY 4 HOURS AS NEEDED FOR WHEEZING AND ASTHMA (Patient taking differently: Take 2.5 mg by nebulization every 4 (four) hours as needed for wheezing. )  . allopurinol (ZYLOPRIM) 300 MG tablet TAKE 1 TABLET BY MOUTH EVERY DAY (Patient taking differently: Take 300 mg by mouth daily. )  . calcitRIOL (ROCALTROL) 0.25 MCG capsule Take 0.25 mcg by mouth daily.   . isosorbide mononitrate (IMDUR) 30 MG 24 hr tablet Take 1 tablet (30 mg total) by mouth daily.  Marland Kitchen KLOR-CON M20 20 MEQ tablet TAKE 1 TABLET BY MOUTH EVERY DAY (Patient taking differently: Take 20  mEq by mouth daily. )  . levothyroxine (SYNTHROID, LEVOTHROID) 175 MCG tablet TAKE 1 TABLET EVERY DAY BEFORE BREAKFAST (Patient taking differently: Take 175 mcg by mouth daily before breakfast. )  . Multiple Vitamins-Minerals (CENTRUM SILVER ADULT 50+ PO) Take 1 tablet by mouth daily.  . pantoprazole (PROTONIX) 40 MG tablet TAKE 1 TABLET BY MOUTH EVERY DAY BEFORE SUPPER (Patient taking differently: Take 40 mg by mouth daily. )  . rosuvastatin (CRESTOR) 20 MG tablet Take 1 tablet (20 mg total) by mouth daily.  Marland Kitchen torsemide (DEMADEX) 20 MG tablet TAKE 1 TABLET TWICE A DAY  (Patient taking differently: Take 20 mg by mouth 2 (two) times daily. )  . umeclidinium-vilanterol (ANORO ELLIPTA) 62.5-25 MCG/INH AEPB Inhale 1 puff into the lungs daily.  Marland Kitchen warfarin (COUMADIN) 5 MG tablet TAKE 1/2 TO 1 TABLET BY MOUTH DAILY AS DIRECTED BY COUMADIN CLINIC (Patient taking differently: Take 5 mg by mouth every evening. )             Objective:     BP 133/79 (BP Location: Right Arm)   Pulse 88   Temp 98.1 F (36.7 C) (Axillary)   Resp (!) 35   Ht 5\' 7"  (1.702 m)   Wt 105.3 kg   SpO2 100%   BMI 36.36 kg/m   SpO2: 100 % O2 Flow Rate (L/min): 2 L/min     HEENT: edentulous/ nl oropharynx.    NECK :  without JVD/Nodes/TM/ nl carotid upstrokes bilaterally   LUNGS: no acc muscle use,  Mild barrel  contour chest wall with bilateral  Distant bs s audible wheeze and  without cough on insp or exp maneuver and no  Hyperresonant  to  percussion bilaterally     CV:  RRR  no s3 or murmur or increase in P2, and no edema   ABD: tensely obese but  nontender with poor excursion   in the supine position. No bruits or organomegaly appreciated, bowel sounds nl  MS:   Nl gait/  ext warm without deformities, calf tenderness, cyanosis or clubbing No obvious joint restrictions   SKIN: warm and dry without lesions    NEURO:  alert, approp, nl sensorium with  no motor or cerebellar deficits apparent.      I personally reviewed images and agree with radiology impression as follows:  CXR:  11/24/17 Slight interval increase in pulmonary consolidation and effusion at the right base with interval increase in interstitial edema, suggesting slight interval worsening of pulmonary edema. Cardiomegaly with aortic atherosclerosis is stable.    Labs ordered/ reviewed:    2.lpm >>>  7.28/pC02 83/  p02 71   Lab Results  Component Value Date   WBC 4.3 12/01/2017   HGB 10.9 (L) 12/01/2017   HCT 37.7 (L) 12/01/2017   MCV 107.4 (H) 12/01/2017   PLT 147 (L) 12/01/2017          Lab Results  Component Value Date   TSH 1.258 11/29/2017      BNP  538   11/24/17   Lab Results  Component Value Date   CREATININE 2.63 (H) 12/01/2017   CREATININE 2.74 (H) 11/30/2017   CREATININE 2.83 (H) 11/29/2017   CREATININE 2.11 (H) 07/09/2017   CREATININE 3.43 (H) 07/20/2016   CREATININE 2.53 (H) 05/17/2016          Assessment   COPD with acute exacerbation (HCC)  When respiratory symptoms begin or become refractory well after a patient reports complete smoking cessation,  Especially when this  wasn't the case while they were smoking, a red flag is raised based on the work of Dr Kris Mouton which states:  if you quit smoking when your best day FEV1 is still well preserved it is highly unlikely you will progress to severe disease.  That is to say, once the smoking stops,  the symptoms should not suddenly erupt or markedly worsen.  If so, the differential diagnosis should include  obesity/deconditioning,  LPR/Reflux/Aspiration syndromes,  occult CHF esp if assoc with pleural effusion (see below) , or  especially side effect of medications commonly used in this population ( of which I don't see any examples on his list)  Clearly wt gain has been the main problem here, not progressive airflow obst related to copd, based on the most recent pfts.   Howevere, he could have AB component though wonder how much it may be driven by chf or gerd (esp given how tensely obese he has become)   >>>  For now rec duoneb qid, max gerd rx,  and address the R effusion (see separate a/p)   Pleural effusion on right Note that pleural effusion and copd have the same effect on insp muscles/mechanics (both shorten their length prior to inspiration making them weaker with less force reserve) so they are synergistic in causing sob.   >>> rec tap dry for dx/ therapeutic purposes   Chronic respiratory failure with hypoxia and hypercapnia ([place]) HC03   08/25/17  = 31  Whereas had been as high as 37 earlier  in July 2019  09/10/2017   Walked RA x one lap @ 185 stopped due to  desats to mid 80's declined amb 02 -  ONO RA on cpap 09/10/2017 > not done so repeated 10/10/2017 > not completed - HOC3 up to 33 12/01/17   >>>  He might be better off with NIV at this point but because he already has cpap per [name] and failing it will ask one of our sleep medicine specialists for a second opion and to see if we can help with compliance issues that seem to be holding him back   Chronic cor pulmonale ([place])    >>>  C/w WHO III, rx is to treat the underlying problem , no role for PAH meds at this point         Christinia Gully, MD 12/01/2017

## 2017-12-01 NOTE — Assessment & Plan Note (Addendum)
 >>>    C/w WHO III, rx is to treat the underlying problem , no role for PAH meds at this point

## 2017-12-01 NOTE — Progress Notes (Signed)
RT Note: ABG was obtained from the patient. He was on nasal cannula at that time. Day shift RT was not notified that the ABG was ordered STAT in report but it was drawn as soon as it was realized. Patient is stable presently. ABG results are pending.

## 2017-12-02 ENCOUNTER — Inpatient Hospital Stay (HOSPITAL_COMMUNITY): Payer: Medicare HMO

## 2017-12-02 ENCOUNTER — Encounter (HOSPITAL_COMMUNITY): Payer: Self-pay | Admitting: Student

## 2017-12-02 DIAGNOSIS — I5033 Acute on chronic diastolic (congestive) heart failure: Secondary | ICD-10-CM

## 2017-12-02 DIAGNOSIS — J9622 Acute and chronic respiratory failure with hypercapnia: Secondary | ICD-10-CM

## 2017-12-02 HISTORY — PX: IR THORACENTESIS ASP PLEURAL SPACE W/IMG GUIDE: IMG5380

## 2017-12-02 LAB — BASIC METABOLIC PANEL
ANION GAP: 6 (ref 5–15)
Anion gap: 7 (ref 5–15)
BUN: 37 mg/dL — ABNORMAL HIGH (ref 8–23)
BUN: 34 mg/dL — ABNORMAL HIGH (ref 8–23)
CO2: 37 mmol/L — ABNORMAL HIGH (ref 22–32)
CO2: 38 mmol/L — ABNORMAL HIGH (ref 22–32)
Calcium: 9.6 mg/dL (ref 8.9–10.3)
Calcium: 9.7 mg/dL (ref 8.9–10.3)
Chloride: 99 mmol/L (ref 98–111)
Chloride: 99 mmol/L (ref 98–111)
Creatinine, Ser: 2.55 mg/dL — ABNORMAL HIGH (ref 0.61–1.24)
Creatinine, Ser: 2.61 mg/dL — ABNORMAL HIGH (ref 0.61–1.24)
GFR calc Af Amer: 26 mL/min — ABNORMAL LOW (ref >60)
GFR calc Af Amer: 25 mL/min — ABNORMAL LOW (ref >60)
GFR calc non Af Amer: 22 mL/min — ABNORMAL LOW (ref >60)
GFR, EST NON AFRICAN AMERICAN: 23 mL/min — AB (ref >60)
GLUCOSE: 100 mg/dL — AB (ref 70–99)
Glucose, Bld: 96 mg/dL (ref 70–99)
Potassium: 4.1 mmol/L (ref 3.5–5.1)
Potassium: 3.8 mmol/L (ref 3.5–5.1)
Sodium: 142 mmol/L (ref 135–145)
Sodium: 144 mmol/L (ref 135–145)

## 2017-12-02 LAB — CBC
HCT: 39.8 % (ref 39.0–52.0)
Hemoglobin: 11.5 g/dL — ABNORMAL LOW (ref 13.0–17.0)
MCH: 31.3 pg (ref 26.0–34.0)
MCHC: 28.9 g/dL — ABNORMAL LOW (ref 30.0–36.0)
MCV: 108.2 fL — ABNORMAL HIGH (ref 80.0–100.0)
NRBC: 0 % (ref 0.0–0.2)
PLATELETS: 155 10*3/uL (ref 150–400)
RBC: 3.68 MIL/uL — AB (ref 4.22–5.81)
RDW: 18.2 % — ABNORMAL HIGH (ref 11.5–15.5)
WBC: 4.2 10*3/uL (ref 4.0–10.5)

## 2017-12-02 LAB — PROTIME-INR
INR: 2.23
Prothrombin Time: 24.4 seconds — ABNORMAL HIGH (ref 11.4–15.2)

## 2017-12-02 LAB — BLOOD GAS, ARTERIAL
Acid-Base Excess: 11.3 mmol/L — ABNORMAL HIGH (ref 0.0–2.0)
Acid-Base Excess: 10.4 mmol/L — ABNORMAL HIGH (ref 0.0–2.0)
Acid-Base Excess: 11 mmol/L — ABNORMAL HIGH (ref 0.0–2.0)
BICARBONATE: 37.3 mmol/L — AB (ref 20.0–28.0)
Bicarbonate: 37.6 mmol/L — ABNORMAL HIGH (ref 20.0–28.0)
Bicarbonate: 37.1 mmol/L — ABNORMAL HIGH (ref 20.0–28.0)
Delivery systems: POSITIVE
Delivery systems: POSITIVE
Delivery systems: POSITIVE
Drawn by: 511551
Drawn by: 42624
Drawn by: 236041
EXPIRATORY PAP: 6
Expiratory PAP: 8
Expiratory PAP: 8
FIO2: 30
FIO2: 40
FIO2: 40
INSPIRATORY PAP: 18
Inspiratory PAP: 18
Inspiratory PAP: 20
O2 SAT: 92.3 %
O2 Saturation: 92.7 %
O2 Saturation: 96.1 %
PATIENT TEMPERATURE: 98.6
PCO2 ART: 70.2 mmHg — AB (ref 32.0–48.0)
PH ART: 7.345 — AB (ref 7.350–7.450)
PO2 ART: 67.3 mmHg — AB (ref 83.0–108.0)
Patient temperature: 98.6
Patient temperature: 98.6
pCO2 arterial: 86.8 mmHg (ref 32.0–48.0)
pCO2 arterial: 71.9 mmHg (ref 32.0–48.0)
pH, Arterial: 7.259 — ABNORMAL LOW (ref 7.350–7.450)
pH, Arterial: 7.333 — ABNORMAL LOW (ref 7.350–7.450)
pO2, Arterial: 73.5 mmHg — ABNORMAL LOW (ref 83.0–108.0)
pO2, Arterial: 84.3 mmHg (ref 83.0–108.0)

## 2017-12-02 LAB — BODY FLUID CELL COUNT WITH DIFFERENTIAL
Eos, Fluid: 0 %
Lymphs, Fluid: 9 %
Monocyte-Macrophage-Serous Fluid: 79 % (ref 50–90)
Neutrophil Count, Fluid: 12 % (ref 0–25)
Total Nucleated Cell Count, Fluid: 185 cu mm (ref 0–1000)

## 2017-12-02 LAB — AMMONIA: Ammonia: 55 umol/L — ABNORMAL HIGH (ref 9–35)

## 2017-12-02 LAB — PROTEIN, PLEURAL OR PERITONEAL FLUID: Total protein, fluid: 4.1 g/dL

## 2017-12-02 LAB — GLUCOSE, CAPILLARY
GLUCOSE-CAPILLARY: 93 mg/dL (ref 70–99)
Glucose-Capillary: 81 mg/dL (ref 70–99)
Glucose-Capillary: 106 mg/dL — ABNORMAL HIGH (ref 70–99)

## 2017-12-02 LAB — GLUCOSE, PLEURAL OR PERITONEAL FLUID: GLUCOSE FL: 101 mg/dL

## 2017-12-02 LAB — MAGNESIUM
MAGNESIUM: 3.1 mg/dL — AB (ref 1.7–2.4)
Magnesium: 3.2 mg/dL — ABNORMAL HIGH (ref 1.7–2.4)

## 2017-12-02 LAB — LACTATE DEHYDROGENASE, PLEURAL OR PERITONEAL FLUID: LD, Fluid: 144 U/L — ABNORMAL HIGH (ref 3–23)

## 2017-12-02 LAB — LACTATE DEHYDROGENASE: LDH: 285 U/L — AB (ref 98–192)

## 2017-12-02 MED ORDER — LIDOCAINE HCL (PF) 1 % IJ SOLN
INTRAMUSCULAR | Status: DC | PRN
  Administered 2017-12-02: 10 mL

## 2017-12-02 MED ORDER — LACTULOSE 10 GM/15ML PO SOLN
20.0000 g | Freq: Three times a day (TID) | ORAL | Status: DC
  Administered 2017-12-02 – 2017-12-06 (×12): 20 g via ORAL
  Filled 2017-12-02 (×12): qty 30

## 2017-12-02 MED ORDER — IPRATROPIUM-ALBUTEROL 0.5-2.5 (3) MG/3ML IN SOLN
3.0000 mL | Freq: Four times a day (QID) | RESPIRATORY_TRACT | Status: DC
  Administered 2017-12-02 – 2017-12-05 (×13): 3 mL via RESPIRATORY_TRACT
  Filled 2017-12-02 (×12): qty 3

## 2017-12-02 MED ORDER — LIDOCAINE HCL 1 % IJ SOLN
INTRAMUSCULAR | Status: AC
  Filled 2017-12-02: qty 20

## 2017-12-02 MED ORDER — WARFARIN SODIUM 2.5 MG PO TABS
2.5000 mg | ORAL_TABLET | Freq: Once | ORAL | Status: DC
  Filled 2017-12-02: qty 1

## 2017-12-02 MED ORDER — METOPROLOL SUCCINATE ER 25 MG PO TB24
25.0000 mg | ORAL_TABLET | Freq: Every day | ORAL | Status: DC
  Administered 2017-12-03 – 2017-12-06 (×4): 25 mg via ORAL
  Filled 2017-12-02 (×4): qty 1

## 2017-12-02 MED ORDER — LORAZEPAM 2 MG/ML IJ SOLN
0.5000 mg | Freq: Once | INTRAMUSCULAR | Status: AC
  Administered 2017-12-02: 0.5 mg via INTRAVENOUS
  Filled 2017-12-02: qty 1

## 2017-12-02 NOTE — Progress Notes (Signed)
[facility] Progress Note  Patient: Omar Lambert ZJI:[id number]   PCP: Gayland Curry, DO DOB: July 28, 1940   DOA: 11/24/2017   DOS: 12/02/2017   Date of Service: the patient was seen and examined on 12/02/2017  Brief hospital course: Pt. with PMH of type II DM, COPD, hypothyroidism, HTN, CKD stage III, PAF,; admitted on 11/24/2017, presented with complaint of shortness of breath, was found to have acute hypoxic respiratory failure secondary to acute on chronic diastolic CHF with suspected pneumonia.  Patient was given IV antibiotics as well as aggressively diuresed with IV Lasix Currently further plan is continue respiratory support with BiPAP.  Subjective: Breathing okay.  No nausea no vomiting.  Still somewhat confused.  No fever no chills.  Assessment and Plan: 1.  Acute hypoxic and hypercarbic respiratory failure. Acute on chronic diastolic CHF. Pulmonary hypertension. OSA-noncompliant with CPAP. COPD exacerbation Right-sided pleural effusion  Presents with shortness of breath, was hypoxic initially on 2 L of oxygen now. Last echocardiogram shows 50% EF, moderate pulmonary hypertension. Baseline weight is 101 kg currently running around 105 kg. Received IV Lasix with renal function worsening. Received IV antibiotics for concern for pneumonia-culture negative completed the antibiotic course so far. Unna boots were placed. On 11/28/2017 patient becomes more lethargic, ABG showed severe hypercarbia with respiratory acidosis.  Exline acidotic again on 12/01/2017, pulmonary consulted. Tolerated BiPAP very well. No significant improvement in mentation. Will change BiPAP to as needed and BiPAP overnight on scheduled basis. Continue nebulizers. Pulmonary recommending ultrasound-guided thoracentesis. To be exudative.  Follow-up on cultures and cytology.  pt was using and benefiting from his CPAP at home but needs a new one as the old one keeps cutting off  CM to FU on this Last  CPAP study was in 2010 at which time auto CPAP was recommended.  2.  Acute on chronic kidney disease stage IV. Hyperkalemia Baseline renal function is 2.3-2.5.  Currently running around 2.7. Also has hyperkalemia. Resolved with Kayexalate. Monitor BMP. Continue to monitor on telemetry. Holding diuresis for now.  3.  Elevated troponin. Mild associated with chronic kidney disease as well as demand ischemia. No evidence of ACS.  4.  Paroxysmal A. fib. Currently rate controlled. Continue Coumadin.  5. Type 2 Diabetes Mellitus, uncontroled with hyperglycemia and renal complication Diet controlled. last hemoglobin A1c was 6.2  6. Anemia of chronic kidney disease Hemoglobin stable. Monitor.  7.  Morbid obesity  Body mass index is 37.33 kg/m.  Dietary consultation  8. Hypothyroidism -Continue Synthroid  9.  Acute metabolic encephalopathy. From hypercarbia. As well as ammonemia No evidence of liver dysfunction. We will use scheduled lactulose for now. Asterixis have resolved. CT head unremarkable for any acute abnormality as well.  Diet: cardiac diet DVT Prophylaxis: on therapeutic anticoagulation.  Advance goals of care discussion: full code  Family Communication: family was present at bedside, at the time of interview. The pt provided permission to discuss medical plan with the family. Opportunity was given to ask question and all questions were answered satisfactorily.   Disposition:  Discharge to be determined.  Consultants: Pulmonary Procedures: BiPAP  Scheduled Meds: . calcitRIOL  0.25 mcg Oral Daily  . ipratropium-albuterol  3 mL Nebulization Q6H  . lactulose  20 g Oral TID  . levothyroxine  175 mcg Oral QAC breakfast  . lidocaine      . mouth rinse  15 mL Mouth Rinse BID  . metoprolol succinate  12.5 mg Oral Daily  . multivitamin with minerals  1  tablet Oral Daily  . pantoprazole  40 mg Oral BID AC  . rosuvastatin  20 mg Oral Daily  . sodium chloride  flush  3 mL Intravenous Q12H  . warfarin  2.5 mg Oral ONCE-1800  . Warfarin - Pharmacist Dosing Inpatient   Does not apply q1800   Continuous Infusions: . sodium chloride Stopped (11/24/17 0700)   PRN Meds: sodium chloride, acetaminophen, albuterol, lidocaine (PF), sodium chloride flush Antibiotics: Anti-infectives (From admission, onward)   Start     Dose/Rate Route Frequency Ordered Stop   11/28/17 1000  cefdinir (OMNICEF) capsule 300 mg  Status:  Discontinued     300 mg Oral Daily 11/27/17 1412 11/28/17 1222   11/26/17 0600  vancomycin (VANCOCIN) 1,500 mg in sodium chloride 0.9 % 500 mL IVPB  Status:  Discontinued     1,500 mg 250 mL/hr over 120 Minutes Intravenous Every 48 hours 11/24/17 0615 11/24/17 0852   11/25/17 1800  cefdinir (OMNICEF) capsule 300 mg  Status:  Discontinued     300 mg Oral Every 12 hours 11/25/17 1142 11/27/17 1412   11/24/17 0600  ceFEPIme (MAXIPIME) 1 g in sodium chloride 0.9 % 100 mL IVPB  Status:  Discontinued     1 g 200 mL/hr over 30 Minutes Intravenous Every 8 hours 11/24/17 0526 11/24/17 0532   11/24/17 0600  ceFEPIme (MAXIPIME) 1 g in sodium chloride 0.9 % 100 mL IVPB  Status:  Discontinued     1 g 200 mL/hr over 30 Minutes Intravenous Every 24 hours 11/24/17 0532 11/25/17 1142   11/24/17 0545  vancomycin (VANCOCIN) 2,000 mg in sodium chloride 0.9 % 500 mL IVPB     2,000 mg 250 mL/hr over 120 Minutes Intravenous  Once 11/24/17 0534 11/24/17 0700   11/24/17 0200  cefTRIAXone (ROCEPHIN) 1 g in sodium chloride 0.9 % 100 mL IVPB     1 g 200 mL/hr over 30 Minutes Intravenous  Once 11/24/17 0153 11/24/17 0237   11/24/17 0200  azithromycin (ZITHROMAX) 500 mg in sodium chloride 0.9 % 250 mL IVPB     500 mg 250 mL/hr over 60 Minutes Intravenous  Once 11/24/17 0153 11/24/17 0354       Objective: Physical Exam: Vitals:   12/02/17 0759 12/02/17 1122 12/02/17 1601 12/02/17 1705  BP: 127/68  (!) 160/90 136/77  Pulse: 82  (!) 104   Resp: (!) 30   (!) 38    Temp:    97.6 F (36.4 C)  TempSrc:    Axillary  SpO2: 99% 94%  91%  Weight:      Height:        Intake/Output Summary (Last 24 hours) at 12/02/2017 1750 Last data filed at 12/02/2017 0400 Gross per 24 hour  Intake 0 ml  Output 400 ml  Net -400 ml   Filed Weights   11/29/17 0615 12/01/17 0500 12/02/17 0500  Weight: 107.6 kg 105.3 kg 108.1 kg   General: Alert, Awake and Oriented to person, place . Appear in marked distress, affect difficult to assess Eyes: PERRL, Conjunctiva normal ENT: Oral Mucosa clear moist. Neck: difficult to assess  JVD, no Abnormal Mass Or lumps Cardiovascular: S1 and S2 Present, no Murmur, Peripheral Pulses Present Respiratory: increased respiratory effort, Bilateral Air entry equal and Decreased, no use of accessory muscle, bilateral  Crackles, no wheezes Abdomen: Bowel Sound present, Soft and no tenderness, no hernia Skin: no redness, no Rash, no induration Extremities: bilateral  Pedal edema, no calf tenderness Neurologic: Grossly no focal neuro  deficit. Bilaterally Equal motor strength  Data Reviewed: CBC: Recent Labs  Lab 11/28/17 0246 11/29/17 0317 11/30/17 0303 12/01/17 0309 12/02/17 0344  WBC 3.9* 4.8 4.4 4.3 4.2  NEUTROABS  --   --  3.3  --   --   HGB 11.1* 11.2* 11.1* 10.9* 11.5*  HCT 38.8* 39.7 37.7* 37.7* 39.8  MCV 107.5* 107.9* 106.8* 107.4* 108.2*  PLT 144* 153 147* 147* 240   Basic Metabolic Panel: Recent Labs  Lab 11/27/17 0255 11/28/17 0246 11/29/17 0317 11/30/17 0303 12/01/17 0309 12/02/17 0344 12/02/17 1520  NA 140 139 140 140 141 142 144  K 4.8 4.7 5.6* 4.8 4.2 4.1 3.8  CL 95* 96* 95* 97* 98 99 99  CO2 34* 37* 34* 33* 33* 37* 38*  GLUCOSE 90 105* 85 89 87 100* 96  BUN 39* 42* 47* 49* 46* 37* 34*  CREATININE 2.63* 2.72* 2.83* 2.74* 2.63* 2.55* 2.61*  CALCIUM 9.4 9.3 9.7 9.2 9.4 9.6 9.7  MG 2.3 2.4  --  2.7*  --  3.1* 3.2*    Liver Function Tests: Recent Labs  Lab 11/29/17 0317 11/30/17 0303  AST 43*  42*  ALT 24 22  ALKPHOS 194* 171*  BILITOT 1.2 1.5*  PROT 7.8 7.4  ALBUMIN 3.6 3.5   No results for input(s): LIPASE, AMYLASE in the last 168 hours. Recent Labs  Lab 11/29/17 1425 12/01/17 0309 12/02/17 0344  AMMONIA 92* 68* 55*   Coagulation Profile: Recent Labs  Lab 11/28/17 0246 11/29/17 0317 11/30/17 0303 12/01/17 0309 12/02/17 0344  INR 1.92 2.05 2.15 2.33 2.23   Cardiac Enzymes: No results for input(s): CKTOTAL, CKMB, CKMBINDEX, TROPONINI in the last 168 hours. BNP (last 3 results) No results for input(s): PROBNP in the last 8760 hours. CBG: Recent Labs  Lab 12/01/17 1224 12/01/17 1659 12/01/17 2136 12/02/17 0731 12/02/17 1658  GLUCAP 73 116* 103* 81 93   Studies: Dg Chest 1 View  Result Date: 12/02/2017 CLINICAL DATA:  77 year old male status post ultrasound-guided right thoracentesis this afternoon. EXAM: CHEST  1 VIEW COMPARISON:  Chest radiograph 11/24/2017. FINDINGS: Portable AP upright view at 1316 hours. Decreased blunting of the right lung base and mildly improved right lung base ventilation. No pneumothorax. Stable cardiomegaly and mediastinal contours. Stable diffuse increased interstitial opacity. No areas of worsening ventilation. IMPRESSION: Decreased right pleural effusion and no pneumothorax following right side thoracentesis. Electronically Signed   By: Genevie Ann M.D.   On: 12/02/2017 13:31   Ct Head Wo Contrast  Result Date: 12/02/2017 CLINICAL DATA:  77 year old male with altered mental status. Status post right thoracentesis today. EXAM: CT HEAD WITHOUT CONTRAST TECHNIQUE: Contiguous axial images were obtained from the base of the skull through the vertex without intravenous contrast. COMPARISON:  Head CT 10/27/2016. FINDINGS: Study is intermittently degraded by motion artifact despite repeated imaging attempts. Brain: Cerebral volume remains normal for age. No midline shift, ventriculomegaly, mass effect, evidence of mass lesion, intracranial  hemorrhage or evidence of cortically based acute infarction. Gray-white matter differentiation is within normal limits throughout the brain. Vascular: Calcified atherosclerosis at the skull base. Skull: No acute osseous abnormality identified. Sinuses/Orbits: Visualized paranasal sinuses and mastoids are stable and well pneumatized. Other: Postoperative changes to the left globe since 2018. No acute orbit or scalp s the oft tissue finding. IMPRESSION: Motion degraded exam. Grossly stable and normal for age non contrast CT appearance of the brain. Electronically Signed   By: Genevie Ann M.D.   On: 12/02/2017 13:50  Ir Thoracentesis Asp Pleural Space W/img Guide  Result Date: 12/02/2017 INDICATION: Patient with right pleural effusion. Request is made for diagnostic and therapeutic thoracentesis. EXAM: ULTRASOUND GUIDED DIAGNOSTIC AND THERAPEUTIC RIGHT THORACENTESIS MEDICATIONS: 10 mL 1% lidocaine COMPLICATIONS: None immediate. PROCEDURE: An ultrasound guided thoracentesis was thoroughly discussed with the patient and questions answered. The benefits, risks, alternatives and complications were also discussed. The patient understands and wishes to proceed with the procedure. Written consent was obtained. Ultrasound was performed to localize and mark an adequate pocket of fluid in the right chest. The area was then prepped and draped in the normal sterile fashion. 1% Lidocaine was used for local anesthesia. Under ultrasound guidance a 6 Fr Safe-T-Centesis catheter was introduced. Thoracentesis was performed. The catheter was removed and a dressing applied. FINDINGS: A total of approximately 1.2 L of cloudy, amber fluid was removed. Samples were sent to the laboratory as requested by the clinical team. IMPRESSION: Successful ultrasound guided diagnostic and therapeutic right thoracentesis yielding 1.2 L of pleural fluid. Read by: Brynda Greathouse PA-C Electronically Signed   By: Jerilynn Mages.  Shick M.D.   On: 12/02/2017 13:50      Time spent: 35 minutes  Author: Berle Mull, MD [facility] Pager: [phone] 12/02/2017 5:50 PM  Between 7PM-7AM, please contact night-coverage at [email or web address], password [facility]

## 2017-12-02 NOTE — Progress Notes (Signed)
PT Cancellation Note  Patient Details Name: Omar Lambert MRN: [id number] DOB: 1940-02-27   Cancelled Treatment:    Reason Eval/Treat Not Completed: Other (comment) Patient currently on BiPAP. Nursing requesting to hold mobility at this time. Will follow-up as time allows.   Lanney Gins, PT, DPT Supplemental Physical Therapist 12/02/17 9:39 AM Pager: [phone] Office: [phone]

## 2017-12-02 NOTE — Procedures (Signed)
PROCEDURE SUMMARY:  Successful US guided diagnostic and therapeutic right thoracentesis. Yielded 1.2 liters of cloudy, amber fluid. Pt tolerated procedure well. No immediate complications.  Specimen was sent for labs. CXR ordered.  Docia Barrier PA-C 12/02/2017 1:04 PM

## 2017-12-02 NOTE — Progress Notes (Signed)
ANTICOAGULATION CONSULT NOTE - [facility] for Coumadin Indication: Afib  No Known Allergies  Patient Measurements: Height: _0  (170.2 cm) Weight: 238 lb 5.1 oz (108.1 kg) IBW/kg (Calculated) : 66.1  Vital Signs: Temp: 98.7 F (37.1 C) (10/28 0735) Temp Source: Axillary (10/28 0735) BP: 127/68 (10/28 0759) Pulse Rate: 82 (10/28 0759)  Labs: Recent Labs    11/30/17 0303 12/01/17 0309 12/02/17 0344  HGB 11.1* 10.9* 11.5*  HCT 37.7* 37.7* 39.8  PLT 147* 147* 155  LABPROT 23.8* 25.2* 24.4*  INR 2.15 2.33 2.23  CREATININE 2.74* 2.63* 2.55*    Estimated Creatinine Clearance: 28 mL/min (A) (by C-G formula based on SCr of 2.55 mg/dL (H)).   Medical History: Past Medical History:  Diagnosis Date  . Acute kidney injury superimposed on chronic kidney disease ([place]) 11/2016  . Acute respiratory failure ([place]) 11/2016  . Allergy   . Asthma   . Atrial fibrillation ([place])   . 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 Benign essential hypertension   . CHF (congestive heart failure) ([place])   . CKD (chronic kidney disease), stage III ([place])   . COPD (chronic obstructive pulmonary disease) ([place])   . Diabetes ([place])   . Diabetes mellitus without complication ([place])   . Dyspnea   . Gastritis   . GERD (gastroesophageal reflux disease)   . Gout attack 09/2012  . Hypertension   . Sinusitis, maxillary, chronic   . Thyroid disease   . Type II or unspecified type diabetes mellitus without mention of complication, not stated as uncontrolled     Medications:  Medications Prior to Admission  Medication Sig Dispense Refill Last Dose  . acetaminophen (TYLENOL) 325 MG tablet Take 650 mg by mouth every 6 (six) hours as needed for mild pain.   Past Week at Unknown time  . albuterol (PROVENTIL) (2.5 MG/3ML) 0.083% nebulizer solution INHALE CONTENTS OF 1 VIAL IN NEBULIZER EVERY 4 HOURS AS NEEDED FOR WHEEZING AND ASTHMA (Patient taking differently: Take  2.5 mg by nebulization every 4 (four) hours as needed for wheezing. ) 75 mL 4 11/23/2017 at Unknown time  . allopurinol (ZYLOPRIM) 300 MG tablet TAKE 1 TABLET BY MOUTH EVERY DAY (Patient taking differently: Take 300 mg by mouth daily. ) 30 tablet 6 11/23/2017 at Unknown time  . calcitRIOL (ROCALTROL) 0.25 MCG capsule Take 0.25 mcg by mouth daily.    11/23/2017 at Unknown time  . isosorbide mononitrate (IMDUR) 30 MG 24 hr tablet Take 1 tablet (30 mg total) by mouth daily. 30 tablet 3 11/23/2017 at Unknown time  . KLOR-CON M20 20 MEQ tablet TAKE 1 TABLET BY MOUTH EVERY DAY (Patient taking differently: Take 20 mEq by mouth daily. ) 30 tablet 1 11/23/2017 at Unknown time  . levothyroxine (SYNTHROID, LEVOTHROID) 175 MCG tablet TAKE 1 TABLET EVERY DAY BEFORE BREAKFAST (Patient taking differently: Take 175 mcg by mouth daily before breakfast. ) 30 tablet 3 11/23/2017 at Unknown time  . Multiple Vitamins-Minerals (CENTRUM SILVER ADULT 50+ PO) Take 1 tablet by mouth daily.   11/23/2017 at Unknown time  . pantoprazole (PROTONIX) 40 MG tablet TAKE 1 TABLET BY MOUTH EVERY DAY BEFORE SUPPER (Patient taking differently: Take 40 mg by mouth daily. ) 90 tablet 0 11/23/2017 at Unknown time  . rosuvastatin (CRESTOR) 20 MG tablet Take 1 tablet (20 mg total) by mouth daily. 30 tablet 3 11/23/2017 at Unknown time  . torsemide (DEMADEX) 20 MG tablet TAKE 1 TABLET TWICE A DAY (  Patient taking differently: Take 20 mg by mouth 2 (two) times daily. ) 60 tablet 3 11/23/2017 at Unknown time  . umeclidinium-vilanterol (ANORO ELLIPTA) 62.5-25 MCG/INH AEPB Inhale 1 puff into the lungs daily. 30 each 11 11/23/2017 at Unknown time  . warfarin (COUMADIN) 5 MG tablet TAKE 1/2 TO 1 TABLET BY MOUTH DAILY AS DIRECTED BY COUMADIN CLINIC (Patient taking differently: Take 5 mg by mouth every evening. ) 30 tablet 0 11/23/2017 at 2130  . SUPREP BOWEL PREP KIT 17.5-3.13-1.6 GM/177ML SOLN Take 1 kit by mouth as directed. For colonoscopy prep  (Patient not taking: Reported on 11/24/2017) 2 Bottle 0 Completed Course at Unknown time  . UNABLE TO FIND CPAP  Unknown DME- prescribed by doc in [facility]   Taking   Scheduled:  . lidocaine      . calcitRIOL  0.25 mcg Oral Daily  . ipratropium-albuterol  3 mL Nebulization QID  . lactulose  20 g Oral TID  . levothyroxine  175 mcg Oral QAC breakfast  . mouth rinse  15 mL Mouth Rinse BID  . metoprolol succinate  12.5 mg Oral Daily  . multivitamin with minerals  1 tablet Oral Daily  . pantoprazole  40 mg Oral BID AC  . rosuvastatin  20 mg Oral Daily  . sodium chloride flush  3 mL Intravenous Q12H  . Warfarin - Pharmacist Dosing Inpatient   Does not apply q1800   Infusions:  . sodium chloride Stopped (11/24/17 0700)    Assessment: 77 yo male c/o worsening SOB over 2wk, admitted for PNA and completed a course of antibiotics.  Pt to continue Coumadin for Afib.  PTA dose = 2.376m daily except 571mon Wed/Fri.  INR remains therapeutic today. His Coumadin doses over the past several days have been the same as his home regimen.  Goal of Therapy:  INR 2-3   Plan:  Coumadin 2.76m14mO x1 Daily INR  KimManpower Incharm.D., BCPS Clinical Pharmacist Pager: [phone] phone for 12/02/2017 from 8:30-4:00 is [phone]**Pharmacist phone directory can now be found on [email or web address] ([email or web address]).  Listed under [facility] [place]10/28/2019 11:38 AM

## 2017-12-02 NOTE — Progress Notes (Signed)
11/24/2017  Pulmonary consultation / Melvyn Novas Chief Complaint  Patient presents with  . Shortness of Breath   77 yobm quit smoking 1999 at wt 200 lb and followed in pulmonary office since 11/2012 with h/o progressive wt gain and min copd but significant restrictive changes on pfts and osa followed by Dr Ellouise Newer  - trial of Breo 11/25/12 > improved 12/25/2012  - PFTs 12/25/2012 FEV1  1.44 (58%) ratio 69 and no change p B2 p last Breo am of pft's  - Desats on walking noted 09/10/2017 off breo > see resp failure  - 09/10/2017  After extensive coaching inhaler device  effectiveness =  90% with Elipta  >  try anoro one click each am - PFT's  10/10/2017  FEV1 1.00 (42 % ) ratio 81  p no % improvement from saba p anoro prior to study with DLCO  46/53c % corrects to 108  % for alv volume    Last seen in office 10/10/17 confused with details of care and did not bring meds as req  rec - recheck 0NO on RA  Continue cpap > apparently neither done    Readmit 11/24/17 with worse sob x 2 weeks (did not contact my offie ) assoc with cough with brown mucus but has done poorly with admit / rx for copd and hypercarbic despite bipap though compliance has been an issue.    No obvious day to day or daytime variability or assoc ongoing  excess/ purulent sputum or mucus plugs or hemoptysis or cp or chest tightness, subjective wheeze or overt sinus or hb symptoms.     Also denies any obvious fluctuation of symptoms with weather or environmental changes or other aggravating or alleviating factors except as outlined above   No unusual exposure hx or h/o childhood pna/ asthma or knowledge of premature birth.          Objective:     BP 127/68   Pulse 82   Temp 98.7 F (37.1 C) (Axillary)   Resp (!) 30   Ht 5\' 7"  (1.702 m)   Wt 108.1 kg   SpO2 94%   BMI 37.33 kg/m   SpO2: 94 % O2 Flow Rate (L/min): 2 L/min    General: Obese male who is currently off BiPAP he was lethargic HEENT: No JVD lymphadenopathy is  appreciated Neuro: Lethargic but follows commands.  Note he received Ativan at 0 400 this morning CV: Heart sounds are distant PULM: Coarse rhonchi bilaterally with congested cough GI: Abdomen is large protuberant positive bowel sounds  Extremities: warm/dry, 2+ edema  Skin: no rashes or lesions   I personally reviewed images and agree with radiology impression as follows:  CXR:  11/24/17 Slight interval increase in pulmonary consolidation and effusion at the right base with interval increase in interstitial edema, suggesting slight interval worsening of pulmonary edema. Cardiomegaly with aortic atherosclerosis is stable.   12/02/2017 chest x-ray is been ordered>> Labs ordered/ reviewed:    2.lpm >>>  7.28/pC02 83/  p02 71   Lab Results  Component Value Date   WBC 4.3 12/01/2017   HGB 10.9 (L) 12/01/2017   HCT 37.7 (L) 12/01/2017   MCV 107.4 (H) 12/01/2017   PLT 147 (L) 12/01/2017         Lab Results  Component Value Date   TSH 1.258 11/29/2017      BNP  538   11/24/17   Lab Results  Component Value Date   CREATININE 2.63 (H) 12/01/2017  CREATININE 2.74 (H) 11/30/2017   CREATININE 2.83 (H) 11/29/2017   CREATININE 2.11 (H) 07/09/2017   CREATININE 3.43 (H) 07/20/2016   CREATININE 2.53 (H) 05/17/2016          Assessment   Acute respiratory distress in the setting of congestive heart failure in conjunction with noncompliance with CPAP and OSA-OHS -Nocturnal BiPAP -Diuresis as tolerated -Stop given him Ativan at 0 400 a.m.  -Bronchodilators -Monitor weight gain  Right pleural effusion on chest x-ray on 11/24/2017 -Repeat chest x-ray to evaluate pleural effusion -Questionable need for thoracentesis  Elevated ammonia level in the setting of lethargy -Treatment per triad -Stop giving Ativan  Acute on chronic kidney disease Lab Results  Component Value Date   CREATININE 2.55 (H) 12/02/2017   CREATININE 2.63 (H) 12/01/2017   CREATININE 2.74 (H)  11/30/2017   CREATININE 2.11 (H) 07/09/2017   CREATININE 3.43 (H) 07/20/2016   CREATININE 2.53 (H) 05/17/2016   -Per triad  Diabetes mellitus type 2 CBG (last 3)  Recent Labs    12/01/17 1659 12/01/17 2136 12/02/17 0731  GLUCAP 116* 103* 81   -Per triad  Chronic anemia Recent Labs    12/01/17 0309 12/02/17 0344  HGB 10.9* 11.5*   -Per triad  Morbid obesity Filed Weights   11/29/17 0615 12/01/17 0500 12/02/17 0500  Weight: 107.6 kg 105.3 kg 108.1 kg  -Per triad  Hypothyroidism -Per triad    Richardson Landry Minor ACNP Maryanna Shape PCCM Pager [phone] till 1 pm If no answer page [phone] 12/02/2017, 12:03 PM

## 2017-12-03 ENCOUNTER — Inpatient Hospital Stay (HOSPITAL_COMMUNITY): Payer: Medicare HMO

## 2017-12-03 ENCOUNTER — Other Ambulatory Visit: Payer: Self-pay | Admitting: Cardiovascular Disease

## 2017-12-03 DIAGNOSIS — G9340 Encephalopathy, unspecified: Secondary | ICD-10-CM

## 2017-12-03 LAB — BASIC METABOLIC PANEL
Anion gap: 8 (ref 5–15)
BUN: 34 mg/dL — AB (ref 8–23)
CO2: 35 mmol/L — ABNORMAL HIGH (ref 22–32)
CREATININE: 2.49 mg/dL — AB (ref 0.61–1.24)
Calcium: 9.5 mg/dL (ref 8.9–10.3)
Chloride: 100 mmol/L (ref 98–111)
GFR calc Af Amer: 27 mL/min — ABNORMAL LOW (ref >60)
GFR calc non Af Amer: 23 mL/min — ABNORMAL LOW (ref >60)
GLUCOSE: 95 mg/dL (ref 70–99)
Potassium: 4 mmol/L (ref 3.5–5.1)
Sodium: 143 mmol/L (ref 135–145)

## 2017-12-03 LAB — CBC
HEMATOCRIT: 41.5 % (ref 39.0–52.0)
Hemoglobin: 11.6 g/dL — ABNORMAL LOW (ref 13.0–17.0)
MCH: 30.5 pg (ref 26.0–34.0)
MCHC: 28 g/dL — AB (ref 30.0–36.0)
MCV: 109.2 fL — ABNORMAL HIGH (ref 80.0–100.0)
Platelets: 164 10*3/uL (ref 150–400)
RBC: 3.8 MIL/uL — ABNORMAL LOW (ref 4.22–5.81)
RDW: 18 % — AB (ref 11.5–15.5)
WBC: 5.7 10*3/uL (ref 4.0–10.5)
nRBC: 0 % (ref 0.0–0.2)

## 2017-12-03 LAB — GLUCOSE, CAPILLARY
GLUCOSE-CAPILLARY: 77 mg/dL (ref 70–99)
GLUCOSE-CAPILLARY: 102 mg/dL — AB (ref 70–99)
Glucose-Capillary: 103 mg/dL — ABNORMAL HIGH (ref 70–99)
Glucose-Capillary: 106 mg/dL — ABNORMAL HIGH (ref 70–99)

## 2017-12-03 LAB — PROTIME-INR
INR: 2.35
Prothrombin Time: 25.4 seconds — ABNORMAL HIGH (ref 11.4–15.2)

## 2017-12-03 MED ORDER — WARFARIN SODIUM 5 MG PO TABS
5.0000 mg | ORAL_TABLET | Freq: Once | ORAL | Status: AC
  Administered 2017-12-03: 5 mg via ORAL
  Filled 2017-12-03: qty 1

## 2017-12-03 NOTE — Progress Notes (Signed)
EEG Completed; Results Pending  

## 2017-12-03 NOTE — Progress Notes (Signed)
Physical Therapy Treatment Patient Details Name: Omar Lambert MRN: [id number] DOB: 11/13/1940 Today's Date: 12/03/2017    History of Present Illness  77 year old male with history of type 2 diabetes mellitus, COPD, hypothyroidism, hypertension, chronic kidney disease stage III, paroxysmal atrial fibrillation, diabetes mellitus type 2 presented to the emergency room with progressive dyspnea on exertion, productive cough, orthopnea, lower extremity edema for 2 weeks.Chest [place] showed pulmonary edema.      PT Comments    Pt was seen for trip to chair from side of bed via walker and was able to maneuver with cues and help with lines.  Given his recent changes with bipap he may need to go home on O2. Will work on progression of strength and balance as tolerated with home being his plan.  He is safe with supervised gait and wife is aware of his limitations as she is present during therapy session.  Working toward this final goal acutely with transfer and balance training negotiating obstacles.  Follow Up Recommendations  Home health PT;Supervision - Intermittent     Equipment Recommendations  None recommended by PT    Recommendations for Other Services       Precautions / Restrictions Precautions Precautions: Fall Restrictions Weight Bearing Restrictions: No    Mobility  Bed Mobility Overal bed mobility: Needs Assistance             General bed mobility comments: up on side of bed when PT arrived  Transfers Overall transfer level: Needs assistance Equipment used: Rolling walker (2 wheeled);1 person hand held assist Transfers: Sit to/from Stand Sit to Stand: Min guard         General transfer comment: min guard for safety  Ambulation/Gait Ambulation/Gait assistance: Min guard Gait Distance (Feet): 16 Feet Assistive device: Rolling walker (2 wheeled) Gait Pattern/deviations: Step-through pattern;Decreased stride length;Wide base of support(extra time for turns on  walker with vc's) Gait velocity: reduced Gait velocity interpretation: <1.31 ft/sec, indicative of household ambulator     Marine scientist [place] (Stroke Patients Only)       Balance Overall balance assessment: Needs assistance Sitting-balance support: Feet supported Sitting balance-Leahy Scale: Good     Standing balance support: Bilateral upper extremity supported;During functional activity Standing balance-Leahy Scale: Fair Standing balance comment: tolerating standing with good O2 sats up to 98% with effort                            Cognition Arousal/Alertness: Awake/alert Behavior During Therapy: Flat affect Overall Cognitive Status: Within Functional Limits for tasks assessed                                 General Comments: answering questions appropriately and is with wife during therapy session      Exercises      General Comments        Pertinent Vitals/Pain Pain Assessment: No/denies pain    Home Living                      Prior Function            PT Goals (current goals can now be found in the care plan section) Acute Rehab PT Goals Patient Stated Goal: to go home Progress towards PT goals: Progressing toward goals  Frequency    Min 3X/week      PT Plan Current plan remains appropriate    Co-evaluation              AM-PAC PT "6 Clicks" Daily Activity  Outcome Measure  Difficulty turning over in bed (including adjusting bedclothes, sheets and blankets)?: None Difficulty moving from lying on back to sitting on the side of the bed? : Unable Difficulty sitting down on and standing up from a chair with arms (e.g., wheelchair, bedside commode, etc,.)?: Unable Help needed moving to and from a bed to chair (including a wheelchair)?: A Little Help needed walking in hospital room?: A Little Help needed climbing 3-5 steps with a railing? : A Little 6 Click  Score: 15    End of Session Equipment Utilized During Treatment: Gait belt;Oxygen Activity Tolerance: Patient limited by fatigue Patient left: in chair;with call bell/phone within reach;with chair alarm set;with family/visitor present;with nursing/sitter in room Nurse Communication: Mobility status PT Visit Diagnosis: Muscle weakness (generalized) (M62.81)     Time: 4270-6237 PT Time Calculation (min) (ACUTE ONLY): 27 min  Charges:  $Gait Training: 8-22 mins $Therapeutic Activity: 8-22 mins                    Ramond Dial 12/03/2017, 1:22 PM   Mee Hives, PT MS Acute Rehab Dept. Number: [place] and [place]

## 2017-12-03 NOTE — Progress Notes (Signed)
11/24/2017  Pulmonary consultation / Omar Lambert Chief Complaint  Patient presents with  . Shortness of Breath   77 yobm quit smoking 1999 at wt 200 lb and followed in pulmonary office since 11/2012 with h/o progressive wt gain and min copd but significant restrictive changes on pfts and osa followed by Dr Ellouise Newer  - trial of Breo 11/25/12 > improved 12/25/2012  - PFTs 12/25/2012 FEV1  1.44 (58%) ratio 69 and no change p B2 p last Breo am of pft's  - Desats on walking noted 09/10/2017 off breo > see resp failure  - 09/10/2017  After extensive coaching inhaler device  effectiveness =  90% with Elipta  >  try anoro one click each am - PFT's  10/10/2017  FEV1 1.00 (42 % ) ratio 81  p no % improvement from saba p anoro prior to study with DLCO  46/53c % corrects to 108  % for alv volume    Last seen in office 10/10/17 confused with details of care and did not bring meds as req  rec - recheck 0NO on RA  Continue cpap > apparently neither done    Readmit 11/24/17 with worse sob x 2 weeks (did not contact my offie ) assoc with cough with brown mucus but has done poorly with admit / rx for copd and hypercarbic despite bipap though compliance has been an issue.    No obvious day to day or daytime variability or assoc ongoing  excess/ purulent sputum or mucus plugs or hemoptysis or cp or chest tightness, subjective wheeze or overt sinus or hb symptoms.     Also denies any obvious fluctuation of symptoms with weather or environmental changes or other aggravating or alleviating factors except as outlined above   No unusual exposure hx or h/o childhood pna/ asthma or knowledge of premature birth.          Objective:     BP 126/78   Pulse 74   Temp 98.2 F (36.8 C) (Axillary)   Resp 16   Ht 5\' 7"  (1.702 m)   Wt 108.3 kg   SpO2 100%   BMI 37.39 kg/m   SpO2: 100 % O2 Flow Rate (L/min): 2 L/min    General: Obese male currently on BiPAP HEENT: BiPAP mask in place, good fit good air  movement. Neuro:  CV: Heart sounds regular PULM: even/non-labored, lungs bilaterally diminished PJ:ASNK, non-tender, bsx4 active  Extremities: warm/dry, plus edema lower extremity wraps Skin: no rashes or lesions    I personally reviewed images and agree with radiology impression as follows:  CXR:  11/24/17 Slight interval increase in pulmonary consolidation and effusion at the right base with interval increase in interstitial edema, suggesting slight interval worsening of pulmonary edema. Cardiomegaly with aortic atherosclerosis is stable.   12/02/2017 chest x-ray is been ordered>> Labs ordered/ reviewed:    2.lpm >>>  7.28/pC02 83/  p02 71   Lab Results  Component Value Date   WBC 4.3 12/01/2017   HGB 10.9 (L) 12/01/2017   HCT 37.7 (L) 12/01/2017   MCV 107.4 (H) 12/01/2017   PLT 147 (L) 12/01/2017         Lab Results  Component Value Date   TSH 1.258 11/29/2017      BNP  538   11/24/17   Lab Results  Component Value Date   CREATININE 2.63 (H) 12/01/2017   CREATININE 2.74 (H) 11/30/2017   CREATININE 2.83 (H) 11/29/2017   CREATININE 2.11 (H) 07/09/2017  CREATININE 3.43 (H) 07/20/2016   CREATININE 2.53 (H) 05/17/2016        Intake/Output Summary (Last 24 hours) at 12/03/2017 0927 Last data filed at 12/03/2017 0042 Gross per 24 hour  Intake 3 ml  Output -  Net 3 ml      Assessment   Acute respiratory distress in the setting of congestive heart failure in conjunction with noncompliance with CPAP and OSA-OHS.  Arterial blood gases are consistent with obesity hyperventilation syndrome. -Continue nocturnal BiPAP although does report he tore the mask off during the night -Diuresis as tolerated although he does not have an accurate INR -Ativan has been stopped -Continue bronchodilators -Monitor weight -Status post right thoracentesis 12/02/2017 and 1.2 L of cloudy amber fluid follow labs  Right pleural effusion on chest x-ray on 11/24/2017 -Thoracentesis  performed on 12/02/2017 with 1.2 L of cloudy amber fluid  Paroxysmal atrial fibrillation -Coumadin per triad  Elevated ammonia level in the setting of lethargy -Currently on treatment per triad -He has been taken off Ativan  Acute on chronic kidney disease Lab Results  Component Value Date   CREATININE 2.49 (H) 12/03/2017   CREATININE 2.61 (H) 12/02/2017   CREATININE 2.55 (H) 12/02/2017   CREATININE 2.11 (H) 07/09/2017   CREATININE 3.43 (H) 07/20/2016   CREATININE 2.53 (H) 05/17/2016   -Per triad  Diabetes mellitus type 2 CBG (last 3)  Recent Labs    12/02/17 1658 12/02/17 2126 12/03/17 0720  GLUCAP 93 106* 77   -Per triad  Chronic anemia Recent Labs    12/02/17 0344 12/03/17 0311  HGB 11.5* 11.6*   -Per triad  Morbid obesity Filed Weights   12/01/17 0500 12/02/17 0500 12/03/17 0433  Weight: 105.3 kg 108.1 kg 108.3 kg  -Per triad  Hypothyroidism -Per triad    Richardson Landry Alfonzia Woolum ACNP Maryanna Shape PCCM Pager [phone] till 1 pm If no answer page [phone] 12/03/2017, 9:26 AM

## 2017-12-03 NOTE — Progress Notes (Signed)
ANTICOAGULATION CONSULT NOTE - Follow-Up Consult  Pharmacy Consult for Coumadin Indication: Afib  No Known Allergies  Patient Measurements: Height: '5\' 7"'  (170.2 cm) Weight: 238 lb 12.1 oz (108.3 kg) IBW/kg (Calculated) : 66.1  Vital Signs: Temp: 98.3 F (36.8 C) (10/29 1139) Temp Source: Oral (10/29 1139) BP: 130/73 (10/29 1139) Pulse Rate: 95 (10/29 1139)  Labs: Recent Labs    12/01/17 0309 12/02/17 0344 12/02/17 1520 12/03/17 0311  HGB 10.9* 11.5*  --  11.6*  HCT 37.7* 39.8  --  41.5  PLT 147* 155  --  164  LABPROT 25.2* 24.4*  --  25.4*  INR 2.33 2.23  --  2.35  CREATININE 2.63* 2.55* 2.61* 2.49*    Estimated Creatinine Clearance: 28.7 mL/min (A) (by C-G formula based on SCr of 2.49 mg/dL (H)).   Medical History: Past Medical History:  Diagnosis Date  . Acute kidney injury superimposed on chronic kidney disease ([place]) 11/2016  . Acute respiratory failure ([place]) 11/2016  . Allergy   . Asthma   . Atrial fibrillation ([place])   . 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 Benign essential hypertension   . CHF (congestive heart failure) ([place])   . CKD (chronic kidney disease), stage III ([place])   . COPD (chronic obstructive pulmonary disease) ([place])   . Diabetes ([place])   . Diabetes mellitus without complication ([place])   . Dyspnea   . Gastritis   . GERD (gastroesophageal reflux disease)   . Gout attack 09/2012  . Hypertension   . Sinusitis, maxillary, chronic   . Thyroid disease   . Type II or unspecified type diabetes mellitus without mention of complication, not stated as uncontrolled     Medications:  Medications Prior to Admission  Medication Sig Dispense Refill Last Dose  . acetaminophen (TYLENOL) 325 MG tablet Take 650 mg by mouth every 6 (six) hours as needed for mild pain.   Past Week at Unknown time  . albuterol (PROVENTIL) (2.5 MG/3ML) 0.083% nebulizer solution INHALE CONTENTS OF 1 VIAL IN NEBULIZER EVERY 4 HOURS AS NEEDED FOR WHEEZING AND  ASTHMA (Patient taking differently: Take 2.5 mg by nebulization every 4 (four) hours as needed for wheezing. ) 75 mL 4 11/23/2017 at Unknown time  . allopurinol (ZYLOPRIM) 300 MG tablet TAKE 1 TABLET BY MOUTH EVERY DAY (Patient taking differently: Take 300 mg by mouth daily. ) 30 tablet 6 11/23/2017 at Unknown time  . calcitRIOL (ROCALTROL) 0.25 MCG capsule Take 0.25 mcg by mouth daily.    11/23/2017 at Unknown time  . isosorbide mononitrate (IMDUR) 30 MG 24 hr tablet Take 1 tablet (30 mg total) by mouth daily. 30 tablet 3 11/23/2017 at Unknown time  . KLOR-CON M20 20 MEQ tablet TAKE 1 TABLET BY MOUTH EVERY DAY (Patient taking differently: Take 20 mEq by mouth daily. ) 30 tablet 1 11/23/2017 at Unknown time  . levothyroxine (SYNTHROID, LEVOTHROID) 175 MCG tablet TAKE 1 TABLET EVERY DAY BEFORE BREAKFAST (Patient taking differently: Take 175 mcg by mouth daily before breakfast. ) 30 tablet 3 11/23/2017 at Unknown time  . Multiple Vitamins-Minerals (CENTRUM SILVER ADULT 50+ PO) Take 1 tablet by mouth daily.   11/23/2017 at Unknown time  . pantoprazole (PROTONIX) 40 MG tablet TAKE 1 TABLET BY MOUTH EVERY DAY BEFORE SUPPER (Patient taking differently: Take 40 mg by mouth daily. ) 90 tablet 0 11/23/2017 at Unknown time  . rosuvastatin (CRESTOR) 20 MG tablet Take 1 tablet (20 mg total) by mouth daily. 30 tablet  3 11/23/2017 at Unknown time  . torsemide (DEMADEX) 20 MG tablet TAKE 1 TABLET TWICE A DAY (Patient taking differently: Take 20 mg by mouth 2 (two) times daily. ) 60 tablet 3 11/23/2017 at Unknown time  . umeclidinium-vilanterol (ANORO ELLIPTA) 62.5-25 MCG/INH AEPB Inhale 1 puff into the lungs daily. 30 each 11 11/23/2017 at Unknown time  . SUPREP BOWEL PREP KIT 17.5-3.13-1.6 GM/177ML SOLN Take 1 kit by mouth as directed. For colonoscopy prep (Patient not taking: Reported on 11/24/2017) 2 Bottle 0 Completed Course at Unknown time  . UNABLE TO FIND CPAP  Unknown DME- prescribed by doc in [facility]   Taking    Scheduled:  . calcitRIOL  0.25 mcg Oral Daily  . ipratropium-albuterol  3 mL Nebulization Q6H  . lactulose  20 g Oral TID  . levothyroxine  175 mcg Oral QAC breakfast  . mouth rinse  15 mL Mouth Rinse BID  . metoprolol succinate  25 mg Oral Daily  . pantoprazole  40 mg Oral BID AC  . rosuvastatin  20 mg Oral Daily  . sodium chloride flush  3 mL Intravenous Q12H  . warfarin  2.5 mg Oral ONCE-1800  . Warfarin - Pharmacist Dosing Inpatient   Does not apply q1800   Infusions:  . sodium chloride Stopped (11/24/17 0700)    Assessment: 77 yo male c/o worsening SOB over 2wk, admitted for PNA and completed a course of antibiotics.  Pt to continue Coumadin for Afib.  PTA dose = 2.51m daily except 548mon Wed/Fri.  INR remains therapeutic today. Coumadin dose not given 10/28 as patient was requiring BiPAP and RN unable to safely give oral meds.  Spoke with RN and MD today.  Will attempt to remove BiPAP long enough to administer oral meds.  Goal of Therapy:  INR 2-3   Plan:  Coumadin 9m48mO x1 (increased dose due to missed dose on 10/28) Daily INR  KimManpower Incharm.D., BCPS Clinical Pharmacist Pager: [phone] phone for 12/03/2017 from 8:30-4:00 is [phone]**Pharmacist phone directory can now be found on [email or web address] ([email or web address]).  Listed under [facility] [place]10/29/2019 1:20 PM

## 2017-12-03 NOTE — Progress Notes (Signed)
RT NOTES: Patient has torn bipap mask apart and can no longer obtain a seal. Replaced bipap mask. RN aware.

## 2017-12-03 NOTE — Progress Notes (Signed)
[facility] Progress Note  Patient: Omar Lambert XHB:[id number]   PCP: Gayland Curry, DO DOB: Apr 17, 1940   DOA: 11/24/2017   DOS: 12/03/2017   Date of Service: the patient was seen and examined on 12/03/2017  Brief hospital course: Pt. with PMH of type II DM, COPD, hypothyroidism, HTN, CKD stage III, PAF,; admitted on 11/24/2017, presented with complaint of shortness of breath, was found to have acute hypoxic respiratory failure secondary to acute on chronic diastolic CHF with suspected pneumonia.  Patient was given IV antibiotics as well as aggressively diuresed with IV Lasix Currently further plan is continue respiratory support with BiPAP.  Subjective: Confused in the morning.  Was able to follow commands later in the afternoon.  Breathing better.  No nausea no vomiting.  Able to tolerate oral medicines.  Assessment and Plan: 1.  Acute hypoxic and hypercarbic respiratory failure. Acute on chronic diastolic CHF. Pulmonary hypertension. OSA-noncompliant with CPAP. COPD exacerbation Right-sided pleural effusion  Presents with shortness of breath, was hypoxic initially on 2 L of oxygen now. Last echocardiogram shows 50% EF, moderate pulmonary hypertension. Baseline weight is 101 kg currently running around 105 kg. Received IV Lasix with renal function worsening. Received IV antibiotics for concern for pneumonia-culture negative completed the antibiotic course so far. Unna boots were placed. On 11/28/2017 patient becomes more lethargic, ABG showed severe hypercarbia with respiratory acidosis.  Exline acidotic again on 12/01/2017, pulmonary consulted. Tolerated BiPAP very well. Pulmonary recommending to optimizing the patient on continuous BiPAP and stabilizing for nightly BiPAP before discharging. Continue nebulizers. Underwent ultrasound-guided thoracentesis with 1.2 L of fluid removed. To be exudative.  Follow-up on cultures and cytology.  pt was using and benefiting from  his CPAP at home but needs a new one as the old one keeps cutting off  CM to FU on this Last CPAP study was in 2010 at which time auto CPAP was recommended.  2.  Acute on chronic kidney disease stage IV. Hyperkalemia Baseline renal function is 2.3-2.5.  Currently running around 2.7. Also has hyperkalemia. Resolved with Kayexalate. Monitor BMP. Continue to monitor on telemetry. Holding diuresis for now.  3.  Elevated troponin. Mild associated with chronic kidney disease as well as demand ischemia. No evidence of ACS.  4.  Paroxysmal A. fib. Currently rate controlled. Continue Coumadin.  5. Type 2 Diabetes Mellitus, uncontroled with hyperglycemia and renal complication Diet controlled. last hemoglobin A1c was 6.2  6. Anemia of chronic kidney disease Hemoglobin stable. Monitor.  7.  Morbid obesity  Body mass index is 37.39 kg/m.  Dietary consultation  8. Hypothyroidism -Continue Synthroid  9.  Acute metabolic encephalopathy. From hypercarbia. As well as ammonemia No evidence of liver dysfunction. We will use scheduled lactulose for now. Asterixis have resolved. CT head unremarkable for any acute abnormality as well.  Diet: cardiac diet DVT Prophylaxis: on therapeutic anticoagulation.  Advance goals of care discussion: full code  Family Communication: family was present at bedside, at the time of interview. The pt provided permission to discuss medical plan with the family. Opportunity was given to ask question and all questions were answered satisfactorily.   Disposition:  Discharge to be determined.  Consultants: Pulmonary Procedures: BiPAP  Scheduled Meds: . calcitRIOL  0.25 mcg Oral Daily  . ipratropium-albuterol  3 mL Nebulization Q6H  . lactulose  20 g Oral TID  . levothyroxine  175 mcg Oral QAC breakfast  . mouth rinse  15 mL Mouth Rinse BID  . metoprolol succinate  25 mg  Oral Daily  . pantoprazole  40 mg Oral BID AC  . rosuvastatin  20 mg Oral  Daily  . sodium chloride flush  3 mL Intravenous Q12H  . Warfarin - Pharmacist Dosing Inpatient   Does not apply q1800   Continuous Infusions: . sodium chloride Stopped (11/24/17 0700)   PRN Meds: sodium chloride, acetaminophen, albuterol, sodium chloride flush Antibiotics: Anti-infectives (From admission, onward)   Start     Dose/Rate Route Frequency Ordered Stop   11/28/17 1000  cefdinir (OMNICEF) capsule 300 mg  Status:  Discontinued     300 mg Oral Daily 11/27/17 1412 11/28/17 1222   11/26/17 0600  vancomycin (VANCOCIN) 1,500 mg in sodium chloride 0.9 % 500 mL IVPB  Status:  Discontinued     1,500 mg 250 mL/hr over 120 Minutes Intravenous Every 48 hours 11/24/17 0615 11/24/17 0852   11/25/17 1800  cefdinir (OMNICEF) capsule 300 mg  Status:  Discontinued     300 mg Oral Every 12 hours 11/25/17 1142 11/27/17 1412   11/24/17 0600  ceFEPIme (MAXIPIME) 1 g in sodium chloride 0.9 % 100 mL IVPB  Status:  Discontinued     1 g 200 mL/hr over 30 Minutes Intravenous Every 8 hours 11/24/17 0526 11/24/17 0532   11/24/17 0600  ceFEPIme (MAXIPIME) 1 g in sodium chloride 0.9 % 100 mL IVPB  Status:  Discontinued     1 g 200 mL/hr over 30 Minutes Intravenous Every 24 hours 11/24/17 0532 11/25/17 1142   11/24/17 0545  vancomycin (VANCOCIN) 2,000 mg in sodium chloride 0.9 % 500 mL IVPB     2,000 mg 250 mL/hr over 120 Minutes Intravenous  Once 11/24/17 0534 11/24/17 0700   11/24/17 0200  cefTRIAXone (ROCEPHIN) 1 g in sodium chloride 0.9 % 100 mL IVPB     1 g 200 mL/hr over 30 Minutes Intravenous  Once 11/24/17 0153 11/24/17 0237   11/24/17 0200  azithromycin (ZITHROMAX) 500 mg in sodium chloride 0.9 % 250 mL IVPB     500 mg 250 mL/hr over 60 Minutes Intravenous  Once 11/24/17 0153 11/24/17 0354       Objective: Physical Exam: Vitals:   12/03/17 0700 12/03/17 0744 12/03/17 1139 12/03/17 1421  BP: 126/78 126/78 130/73   Pulse: 91 74 95 74  Resp: (!) 24 16  12   Temp: 98.2 F (36.8 C)  98.3 F  (36.8 C)   TempSrc: Axillary  Oral   SpO2: 98% 100% 97% 95%  Weight:      Height:        Intake/Output Summary (Last 24 hours) at 12/03/2017 1810 Last data filed at 12/03/2017 1216 Gross per 24 hour  Intake 6 ml  Output -  Net 6 ml   Filed Weights   12/01/17 0500 12/02/17 0500 12/03/17 0433  Weight: 105.3 kg 108.1 kg 108.3 kg   General: Alert, Awake and Oriented to person, place . Appear in marked distress, affect difficult to assess Eyes: PERRL, Conjunctiva normal ENT: Oral Mucosa clear moist. Neck: difficult to assess  JVD, no Abnormal Mass Or lumps Cardiovascular: S1 and S2 Present, no Murmur, Peripheral Pulses Present Respiratory: increased respiratory effort, Bilateral Air entry equal and Decreased, no use of accessory muscle, bilateral  Crackles, no wheezes Abdomen: Bowel Sound present, Soft and no tenderness, no hernia Skin: no redness, no Rash, no induration Extremities: bilateral  Pedal edema, no calf tenderness Neurologic: Grossly no focal neuro deficit. Bilaterally Equal motor strength  Data Reviewed: CBC: Recent Labs  Lab  11/29/17 0317 11/30/17 0303 12/01/17 0309 12/02/17 0344 12/03/17 0311  WBC 4.8 4.4 4.3 4.2 5.7  NEUTROABS  --  3.3  --   --   --   HGB 11.2* 11.1* 10.9* 11.5* 11.6*  HCT 39.7 37.7* 37.7* 39.8 41.5  MCV 107.9* 106.8* 107.4* 108.2* 109.2*  PLT 153 147* 147* 155 585   Basic Metabolic Panel: Recent Labs  Lab 11/27/17 0255 11/28/17 0246  11/30/17 0303 12/01/17 0309 12/02/17 0344 12/02/17 1520 12/03/17 0311  NA 140 139   < > 140 141 142 144 143  K 4.8 4.7   < > 4.8 4.2 4.1 3.8 4.0  CL 95* 96*   < > 97* 98 99 99 100  CO2 34* 37*   < > 33* 33* 37* 38* 35*  GLUCOSE 90 105*   < > 89 87 100* 96 95  BUN 39* 42*   < > 49* 46* 37* 34* 34*  CREATININE 2.63* 2.72*   < > 2.74* 2.63* 2.55* 2.61* 2.49*  CALCIUM 9.4 9.3   < > 9.2 9.4 9.6 9.7 9.5  MG 2.3 2.4  --  2.7*  --  3.1* 3.2*  --    < > = values in this interval not displayed.     Liver Function Tests: Recent Labs  Lab 11/29/17 0317 11/30/17 0303  AST 43* 42*  ALT 24 22  ALKPHOS 194* 171*  BILITOT 1.2 1.5*  PROT 7.8 7.4  ALBUMIN 3.6 3.5   No results for input(s): LIPASE, AMYLASE in the last 168 hours. Recent Labs  Lab 11/29/17 1425 12/01/17 0309 12/02/17 0344  AMMONIA 92* 68* 55*   Coagulation Profile: Recent Labs  Lab 11/29/17 0317 11/30/17 0303 12/01/17 0309 12/02/17 0344 12/03/17 0311  INR 2.05 2.15 2.33 2.23 2.35   Cardiac Enzymes: No results for input(s): CKTOTAL, CKMB, CKMBINDEX, TROPONINI in the last 168 hours. BNP (last 3 results) No results for input(s): PROBNP in the last 8760 hours. CBG: Recent Labs  Lab 12/02/17 1658 12/02/17 2126 12/03/17 0720 12/03/17 1138 12/03/17 1653  GLUCAP 93 106* 77 102* 103*   Studies: No results found.   Time spent: 35 minutes  Author: Berle Mull, MD [facility] Pager: [phone] 12/03/2017 6:10 PM  Between 7PM-7AM, please contact night-coverage at [email or web address], password [facility]

## 2017-12-03 NOTE — Procedures (Signed)
History: 77 year old male being evaluated for encephalopathy.  Sedation: None  Technique: This is a 21 channel routine scalp EEG performed at the bedside with bipolar and monopolar montages arranged in accordance to the international 10/20 system of electrode placement. One channel was dedicated to EKG recording.    Background: There is a posterior dominant rhythm of 8 Hz which is seen symmetrically.  In addition there is generalized irregular delta and theta slow activity throughout the study. Tremor is noted by the tech and facial tremor is seen on video without EEG correlate.  Photic stimulation: Physiologic driving is not performed  EEG Abnormalities: 1) generalized irregular slow activity  Clinical Interpretation: This EEG is consistent with a mild generalized nonspecific cerebral dysfunction (encephalopathy). There was no seizure or seizure predisposition recorded on this study. Please note that a normal EEG does not preclude the possibility of epilepsy.   Roland Rack, MD [facility] [phone]  If 7pm- 7am, please page neurology on call as listed in [place].

## 2017-12-04 ENCOUNTER — Inpatient Hospital Stay (HOSPITAL_COMMUNITY): Payer: Medicare HMO

## 2017-12-04 ENCOUNTER — Other Ambulatory Visit (HOSPITAL_COMMUNITY): Payer: Self-pay | Admitting: Radiology

## 2017-12-04 DIAGNOSIS — K2940 Chronic atrophic gastritis without bleeding: Secondary | ICD-10-CM

## 2017-12-04 DIAGNOSIS — N183 Chronic kidney disease, stage 3 (moderate): Secondary | ICD-10-CM

## 2017-12-04 LAB — BASIC METABOLIC PANEL
Anion gap: 8 (ref 5–15)
BUN: 34 mg/dL — AB (ref 8–23)
CALCIUM: 9.4 mg/dL (ref 8.9–10.3)
CO2: 36 mmol/L — ABNORMAL HIGH (ref 22–32)
CREATININE: 2.63 mg/dL — AB (ref 0.61–1.24)
Chloride: 98 mmol/L (ref 98–111)
GFR, EST AFRICAN AMERICAN: 25 mL/min — AB (ref >60)
GFR, EST NON AFRICAN AMERICAN: 22 mL/min — AB (ref >60)
Glucose, Bld: 109 mg/dL — ABNORMAL HIGH (ref 70–99)
Potassium: 4.1 mmol/L (ref 3.5–5.1)
SODIUM: 142 mmol/L (ref 135–145)

## 2017-12-04 LAB — CBC
HEMATOCRIT: 40.4 % (ref 39.0–52.0)
Hemoglobin: 11.6 g/dL — ABNORMAL LOW (ref 13.0–17.0)
MCH: 31.1 pg (ref 26.0–34.0)
MCHC: 28.7 g/dL — AB (ref 30.0–36.0)
MCV: 108.3 fL — ABNORMAL HIGH (ref 80.0–100.0)
NRBC: 0 % (ref 0.0–0.2)
PLATELETS: 165 10*3/uL (ref 150–400)
RBC: 3.73 MIL/uL — ABNORMAL LOW (ref 4.22–5.81)
RDW: 18 % — ABNORMAL HIGH (ref 11.5–15.5)
WBC: 5.7 10*3/uL (ref 4.0–10.5)

## 2017-12-04 LAB — PROTIME-INR
INR: 2.48
Prothrombin Time: 26.5 seconds — ABNORMAL HIGH (ref 11.4–15.2)

## 2017-12-04 LAB — GLUCOSE, CAPILLARY: GLUCOSE-CAPILLARY: 85 mg/dL (ref 70–99)

## 2017-12-04 MED ORDER — WARFARIN SODIUM 2.5 MG PO TABS: 2.5000 mg | ORAL_TABLET | Freq: Once | ORAL | Status: DC

## 2017-12-04 NOTE — Care Management Note (Addendum)
Case Management Note  Patient Details  Name: Omar Lambert MRN: [id number] Date of Birth: 30-Aug-1940  Subjective/Objective:  From home, presents with acute hypoxic resp failure, a/c chf, pulm htn, cope, ckd3, paf (coumadin pta), plan to dc in 24 to 48 hrs per MD note.   He will need HHRN for disease management , wife chose [facility] from [employer].  Referral given to [place] with [facility], soc will begin 24-48 hrs post dc.  Patient also will need a new cpap machine, NCM contacted [facility] who patient has cpap with, they state they will call me back with information they need and that MD needs to note that he is benefitting from the cpap that he has. Rep states she will be faxing me some information for MD to fill out and sign, then will have to go thru other depts to see if he can get another cpap.   10/22 Tomi Bamberger RN, BSN - NCM spoke with Bebi at [facility], fax [phone],  She states they need the cpap setting and MD notes to be sent at one time, patient did not wear the cpap last pm but will wear it tonight, so tomorrow NCM will fax the settings with the MD notes.     10/25 Tomi Bamberger RN, BSN - NCM did receive the order from [facility] for MD to sign and date, this was done and refaxed back on 10/24. NCM was told everything is looking good and now the QC dept is reviewing to make sure everything is ok from Medicare standpoint, they will call NCM when cpap has been approved and most likely when approved it will be delivered to his home next week around Thursday or so.  KAH added HHPT to [facility] services.  He will be here over weekend, had co2 retention.   Contact at [facility] is [employer] [phone], will need to contact him to see if cpap has been approved.              Action/Plan: NCM will follow for transition of care needs.  Expected Discharge Date:  11/27/17               Expected Discharge Plan:  [place]  In-House Referral:     Discharge planning  Services  CM Consult  Post Acute Care Choice:  Home Health Choice offered to:  Spouse  DME Arranged:    DME Agency:     HH Arranged:  RN, Disease Management, PTRN,PT  [place] Agency:  [facility]  Status of Service:  In process, will continue to follow  If discussed at Long Length of Stay Meetings, dates discussed:    Additional Comments: 12/03/17 Per notes; pt currently requiring BIPAP.  CPAP vs BIPAP need at discharge not yet determined.  CM reached out to [place] - CPAP is arpproved - service informed that pt will likely need machine prior to discharge. [facility] to follow back up directly with pt/CM to inform of delivery date. Maryclare Labrador, RN 12/04/2017, 8:06 AM

## 2017-12-04 NOTE — Care Management Note (Signed)
Case Management Note Previous Note Created by Tomi Bamberger  Patient Details  Name: Omar Lambert MRN: [id number] Date of Birth: Aug 18, 1940  Subjective/Objective:  From home, presents with acute hypoxic resp failure, a/c chf, pulm htn, cope, ckd3, paf (coumadin pta), plan to dc in 24 to 48 hrs per MD note.   He will need HHRN for disease management , wife chose [facility] from [employer].  Referral given to [place] with [facility], soc will begin 24-48 hrs post dc.  Patient also will need a new cpap machine, NCM contacted [facility] who patient has cpap with, they state they will call me back with information they need and that MD needs to note that he is benefitting from the cpap that he has. Rep states she will be faxing me some information for MD to fill out and sign, then will have to go thru other depts to see if he can get another cpap.   10/22 Tomi Bamberger RN, BSN - NCM spoke with Bebi at [facility], fax [phone],  She states they need the cpap setting and MD notes to be sent at one time, patient did not wear the cpap last pm but will wear it tonight, so tomorrow NCM will fax the settings with the MD notes.     10/25 Tomi Bamberger RN, BSN - NCM did receive the order from [facility] for MD to sign and date, this was done and refaxed back on 10/24. NCM was told everything is looking good and now the QC dept is reviewing to make sure everything is ok from Medicare standpoint, they will call NCM when cpap has been approved and most likely when approved it will be delivered to his home next week around Thursday or so.  KAH added HHPT to [facility] services.  He will be here over weekend, had co2 retention.   Contact at [facility] is [employer] [phone], will need to contact him to see if cpap has been approved.              Action/Plan: NCM will follow for transition of care needs.  Expected Discharge Date:  11/27/17               Expected Discharge Plan:  [place]  In-House Referral:     Discharge planning Services  CM Consult  Post Acute Care Choice:  Home Health Choice offered to:  Spouse  DME Arranged:    DME Agency:     HH Arranged:  RN, Disease Management, PTRN,PT  [place] Agency:  [facility]  Status of Service:  In process, will continue to follow  If discussed at Long Length of Stay Meetings, dates discussed:    Additional Comments: 12/03/17 Per notes; pt currently requiring BIPAP.  CPAP vs BIPAP need at discharge not yet determined.  CM reached out to [place] - CPAP is arpproved - service informed that pt will likely need machine prior to discharge. [facility] to follow back up directly with pt/CM to inform of delivery date. Maryclare Labrador, RN 12/04/2017, 8:26 AM

## 2017-12-04 NOTE — Care Management Note (Addendum)
Case Management Note  Patient Details  Name: Omar Lambert MRN: [id number] Date of Birth: 20-Oct-1940  Subjective/Objective:  From home, presents with acute hypoxic resp failure, a/c chf, pulm htn, cope, ckd3, paf (coumadin pta), plan to dc in 24 to 48 hrs per MD note.   He will need HHRN for disease management , wife chose [facility] from [employer].  Referral given to [place] with [facility], soc will begin 24-48 hrs post dc.  Patient also will need a new cpap machine, NCM contacted [facility] who patient has cpap with, they state they will call me back with information they need and that MD needs to note that he is benefitting from the cpap that he has. Rep states she will be faxing me some information for MD to fill out and sign, then will have to go thru other depts to see if he can get another cpap.   10/22 Tomi Bamberger RN, BSN - NCM spoke with Bebi at [facility], fax [phone],  She states they need the cpap setting and MD notes to be sent at one time, patient did not wear the cpap last pm but will wear it tonight, so tomorrow NCM will fax the settings with the MD notes.     10/25 Tomi Bamberger RN, BSN - NCM did receive the order from [facility] for MD to sign and date, this was done and refaxed back on 10/24. NCM was told everything is looking good and now the QC dept is reviewing to make sure everything is ok from Medicare standpoint, they will call NCM when cpap has been approved and most likely when approved it will be delivered to his home next week around Thursday or so.  KAH added HHPT to [facility] services.  He will be here over weekend, had co2 retention.   Contact at [facility] is [employer] [phone], will need to contact him to see if cpap has been approved.              Action/Plan: NCM will follow for transition of care needs.  Expected Discharge Date:  11/27/17               Expected Discharge Plan:  [place]  In-House Referral:     Discharge planning  Services  CM Consult  Post Acute Care Choice:  Home Health Choice offered to:  Spouse  DME Arranged:    DME Agency:     HH Arranged:  RN, Disease Management, PTRN,PT  [place] Agency:  [facility]  Status of Service:  In process, will continue to follow  If discussed at Long Length of Stay Meetings, dates discussed:    Additional Comments: 12/04/17 Attending confimred that pt will actually need BIPAP not CPAP at discharge. Parameters for BIPAP are not know yet at this time.  CM left VM for [employer] rep  12/03/17 Per notes; pt currently requiring BIPAP.  CPAP vs BIPAP need at discharge not yet determined.  CM reached out to [place] - CPAP is arpproved - service informed that pt will likely need machine prior to discharge. [facility] to follow back up directly with pt/CM to inform of delivery date. Maryclare Labrador, RN 12/04/2017, 8:25 AM

## 2017-12-04 NOTE — Progress Notes (Signed)
ANTICOAGULATION CONSULT NOTE - Follow-Up Consult  Pharmacy Consult for Coumadin Indication: Afib  No Known Allergies  Patient Measurements: Height: 5' 7" (170.2 cm) Weight: 237 lb 7 oz (107.7 kg) IBW/kg (Calculated) : 66.1  Vital Signs: Temp: 98.1 F (36.7 C) (10/30 0715) Temp Source: Oral (10/30 0715) BP: 124/83 (10/30 1140) Pulse Rate: 72 (10/30 1140)  Labs: Recent Labs    12/02/17 0344 12/02/17 1520 12/03/17 0311 12/04/17 0245  HGB 11.5*  --  11.6* 11.6*  HCT 39.8  --  41.5 40.4  PLT 155  --  164 165  LABPROT 24.4*  --  25.4* 26.5*  INR 2.23  --  2.35 2.48  CREATININE 2.55* 2.61* 2.49* 2.63*    Estimated Creatinine Clearance: 27.1 mL/min (A) (by C-G formula based on SCr of 2.63 mg/dL (H)).   Medical History: Past Medical History:  Diagnosis Date  . Acute kidney injury superimposed on chronic kidney disease ([place]) 11/2016  . Acute respiratory failure ([place]) 11/2016  . Allergy   . Asthma   . Atrial fibrillation ([place])   . 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 Benign essential hypertension   . CHF (congestive heart failure) ([place])   . CKD (chronic kidney disease), stage III ([place])   . COPD (chronic obstructive pulmonary disease) ([place])   . Diabetes ([place])   . Diabetes mellitus without complication ([place])   . Dyspnea   . Gastritis   . GERD (gastroesophageal reflux disease)   . Gout attack 09/2012  . Hypertension   . Sinusitis, maxillary, chronic   . Thyroid disease   . Type II or unspecified type diabetes mellitus without mention of complication, not stated as uncontrolled     Medications:  Medications Prior to Admission  Medication Sig Dispense Refill Last Dose  . acetaminophen (TYLENOL) 325 MG tablet Take 650 mg by mouth every 6 (six) hours as needed for mild pain.   Past Week at Unknown time  . albuterol (PROVENTIL) (2.5 MG/3ML) 0.083% nebulizer solution INHALE CONTENTS OF 1 VIAL IN NEBULIZER EVERY 4 HOURS AS NEEDED FOR WHEEZING AND  ASTHMA (Patient taking differently: Take 2.5 mg by nebulization every 4 (four) hours as needed for wheezing. ) 75 mL 4 11/23/2017 at Unknown time  . allopurinol (ZYLOPRIM) 300 MG tablet TAKE 1 TABLET BY MOUTH EVERY DAY (Patient taking differently: Take 300 mg by mouth daily. ) 30 tablet 6 11/23/2017 at Unknown time  . calcitRIOL (ROCALTROL) 0.25 MCG capsule Take 0.25 mcg by mouth daily.    11/23/2017 at Unknown time  . isosorbide mononitrate (IMDUR) 30 MG 24 hr tablet Take 1 tablet (30 mg total) by mouth daily. 30 tablet 3 11/23/2017 at Unknown time  . KLOR-CON M20 20 MEQ tablet TAKE 1 TABLET BY MOUTH EVERY DAY (Patient taking differently: Take 20 mEq by mouth daily. ) 30 tablet 1 11/23/2017 at Unknown time  . levothyroxine (SYNTHROID, LEVOTHROID) 175 MCG tablet TAKE 1 TABLET EVERY DAY BEFORE BREAKFAST (Patient taking differently: Take 175 mcg by mouth daily before breakfast. ) 30 tablet 3 11/23/2017 at Unknown time  . Multiple Vitamins-Minerals (CENTRUM SILVER ADULT 50+ PO) Take 1 tablet by mouth daily.   11/23/2017 at Unknown time  . pantoprazole (PROTONIX) 40 MG tablet TAKE 1 TABLET BY MOUTH EVERY DAY BEFORE SUPPER (Patient taking differently: Take 40 mg by mouth daily. ) 90 tablet 0 11/23/2017 at Unknown time  . rosuvastatin (CRESTOR) 20 MG tablet Take 1 tablet (20 mg total) by mouth daily. 30 tablet  3 11/23/2017 at Unknown time  . torsemide (DEMADEX) 20 MG tablet TAKE 1 TABLET TWICE A DAY (Patient taking differently: Take 20 mg by mouth 2 (two) times daily. ) 60 tablet 3 11/23/2017 at Unknown time  . umeclidinium-vilanterol (ANORO ELLIPTA) 62.5-25 MCG/INH AEPB Inhale 1 puff into the lungs daily. 30 each 11 11/23/2017 at Unknown time  . SUPREP BOWEL PREP KIT 17.5-3.13-1.6 GM/177ML SOLN Take 1 kit by mouth as directed. For colonoscopy prep (Patient not taking: Reported on 11/24/2017) 2 Bottle 0 Completed Course at Unknown time  . UNABLE TO FIND CPAP  Unknown DME- prescribed by doc in [facility]   Taking    Scheduled:  . calcitRIOL  0.25 mcg Oral Daily  . ipratropium-albuterol  3 mL Nebulization Q6H  . lactulose  20 g Oral TID  . levothyroxine  175 mcg Oral QAC breakfast  . mouth rinse  15 mL Mouth Rinse BID  . metoprolol succinate  25 mg Oral Daily  . pantoprazole  40 mg Oral BID AC  . rosuvastatin  20 mg Oral Daily  . sodium chloride flush  3 mL Intravenous Q12H  . Warfarin - Pharmacist Dosing Inpatient   Does not apply q1800   Infusions:  . sodium chloride Stopped (11/24/17 0700)    Assessment: 77 yo male c/o worsening SOB over 2wk, admitted for PNA and completed a course of antibiotics.  Pt to continue Coumadin for Afib.  PTA dose = 2.47m daily except 530mon Wed/Fri.  INR remains therapeutic today.  Pt still requiring BiPAP at times.  Spoke with RN.  Will attempt to remove BiPAP long enough to administer oral meds.  Goal of Therapy:  INR 2-3   Plan:  Coumadin 2.42m41mO x1  Daily INR  KimManpower Incharm.D., BCPS Clinical Pharmacist Pager: [phone] phone for 12/04/2017 from 8:30-4:00 is [phone]**Pharmacist phone directory can now be found on [email or web address] (PW [email or web address]).  Listed under [facility] [place]10/30/2019 12:20 PM

## 2017-12-04 NOTE — Progress Notes (Signed)
PROGRESS NOTE  Omar Lambert DXI:[id number] DOB: [birth year] DOA: 11/24/2017 PCP: Gayland Curry, DO   LOS: 10 days   Brief Narrative / Interim history: 77 year old male with history of type 2 diabetes, COPD, hypothyroidism, hypertension, chronic kidney disease stage III, paroxysmal A. fib who was admitted on 11/24/2016 with shortness of breath.  He was found to have acute hypoxic respiratory failure secondary to acute on chronic diastolic CHF with suspected underlying pneumonia.  He was given IV antibiotics and diuresed aggressively.  Subjective: -Feels better this morning, breathing better.  Does complain of increased abdominal distention and discomfort  Assessment & Plan: Principal Problem:   Acute on chronic diastolic CHF (congestive heart failure) (HCC) Active Problems:   COPD with acute exacerbation (HCC)   Chronic kidney disease (CKD) stage G3b/A2, moderately decreased glomerular filtration rate (GFR) between 30-44 mL/min/1.73 square meter and albuminuria creatinine ratio between 30-299 mg/g (HCC)   Hypothyroidism   Diabetes mellitus with renal complications (HCC)   Pneumonia   Elevated troponin I level   Chronic respiratory failure with hypoxia and hypercapnia (HCC)   Chronic cor pulmonale (HCC)   Pleural effusion on right   Anemia   HCAP (healthcare-associated pneumonia)   Pressure injury of skin   Acute hypoxic and hypercarbic respiratory failure, Likely multifactorial due to acute on chronic diastolic CHF, pulmonary hypertension, OSA and COPD exacerbation -Continue to wean off oxygen as tolerated, he did well on BiPAP but he is on CPAP at home, discussed with respiratory therapy to transition him back to nightly CPAP in preparation for home discharge -Initially received IV Lasix and his renal function worsened, now on hold.  He appears euvolemic -He finished a course of IV antibiotics with concern for pneumonia, cultures remain negative -Underwent ultrasound-guided  thoracentesis, cultures have remained negative -Case manager assisting with home CPAP  Acute kidney injury and chronic kidney disease stage IV -Baseline renal function is 2.3-2.5, currently his creatinine is 2.6, will continue to closely monitor  Elevated troponin -Probably demand ischemia, no evidence of ACS  Abdominal distention -New today, obtain plain abdominal x-ray which showed ileus, continue bowel regimen  Paroxysmal A. fib -Currently rate controlled with metoprolol, continue Coumadin for anticoagulation  Hyperlipidemia -Continue statin  Hypothyroidism -Continue Synthroid  Acute metabolic encephalopathy -From hypercarbia, returned to baseline per wife   Scheduled Meds: . calcitRIOL  0.25 mcg Oral Daily  . ipratropium-albuterol  3 mL Nebulization Q6H  . lactulose  20 g Oral TID  . levothyroxine  175 mcg Oral QAC breakfast  . mouth rinse  15 mL Mouth Rinse BID  . metoprolol succinate  25 mg Oral Daily  . pantoprazole  40 mg Oral BID AC  . rosuvastatin  20 mg Oral Daily  . sodium chloride flush  3 mL Intravenous Q12H  . warfarin  2.5 mg Oral ONCE-1800  . Warfarin - Pharmacist Dosing Inpatient   Does not apply q1800   Continuous Infusions: . sodium chloride Stopped (11/24/17 0700)   PRN Meds:.sodium chloride, acetaminophen, albuterol, sodium chloride flush  DVT prophylaxis: On therapeutic anticoagulation Code Status: Full code Family Communication: Wife present at bedside Disposition Plan: Home when ready, will attempt to see if he stable on CPAP  Consultants:   PCCM  Procedures:   NONE   Antimicrobials:  Finished course    Objective: Vitals:   12/04/17 0740 12/04/17 1140 12/04/17 1228 12/04/17 1446  BP:  124/83 118/79   Pulse:  72 66   Resp:  (!) 25 (!) 34  Temp:   97.8 F (36.6 C)   TempSrc:      SpO2: 96% 97% 97% 99%  Weight:      Height:        Intake/Output Summary (Last 24 hours) at 12/04/2017 1512 Last data filed at 12/04/2017  0900 Gross per 24 hour  Intake 0 ml  Output 600 ml  Net -600 ml   Filed Weights   12/02/17 0500 12/03/17 0433 12/04/17 0500  Weight: 108.1 kg 108.3 kg 107.7 kg    Examination:  Constitutional: NAD Eyes: lids and conjunctivae normal ENMT: Mucous membranes are moist. No oropharyngeal exudates Neck: normal, supple, no masses, no thyromegaly Respiratory: clear to auscultation bilaterally, no wheezing, no crackles.  Decreased breath sounds at the bases Cardiovascular: Regular rate and rhythm, no murmurs / rubs / gallops. No LE edema.  Abdomen: no tenderness. Musculoskeletal: no clubbing / cyanosis.  Skin: no rashes, lesions, ulcers. No induration Neurologic: CN 2-12 grossly intact. Strength 5/5 in all 4.  Psychiatric: Normal judgment and insight. Alert and oriented x 3. Normal mood.    Data Reviewed: I have independently reviewed following labs and imaging studies   CBC: Recent Labs  Lab 11/30/17 0303 12/01/17 0309 12/02/17 0344 12/03/17 0311 12/04/17 0245  WBC 4.4 4.3 4.2 5.7 5.7  NEUTROABS 3.3  --   --   --   --   HGB 11.1* 10.9* 11.5* 11.6* 11.6*  HCT 37.7* 37.7* 39.8 41.5 40.4  MCV 106.8* 107.4* 108.2* 109.2* 108.3*  PLT 147* 147* 155 164 322   Basic Metabolic Panel: Recent Labs  Lab 11/28/17 0246  11/30/17 0303 12/01/17 0309 12/02/17 0344 12/02/17 1520 12/03/17 0311 12/04/17 0245  NA 139   < > 140 141 142 144 143 142  K 4.7   < > 4.8 4.2 4.1 3.8 4.0 4.1  CL 96*   < > 97* 98 99 99 100 98  CO2 37*   < > 33* 33* 37* 38* 35* 36*  GLUCOSE 105*   < > 89 87 100* 96 95 109*  BUN 42*   < > 49* 46* 37* 34* 34* 34*  CREATININE 2.72*   < > 2.74* 2.63* 2.55* 2.61* 2.49* 2.63*  CALCIUM 9.3   < > 9.2 9.4 9.6 9.7 9.5 9.4  MG 2.4  --  2.7*  --  3.1* 3.2*  --   --    < > = values in this interval not displayed.   GFR: Estimated Creatinine Clearance: 27.1 mL/min (A) (by C-G formula based on SCr of 2.63 mg/dL (H)). Liver Function Tests: Recent Labs  Lab 11/29/17 0317  11/30/17 0303  AST 43* 42*  ALT 24 22  ALKPHOS 194* 171*  BILITOT 1.2 1.5*  PROT 7.8 7.4  ALBUMIN 3.6 3.5   No results for input(s): LIPASE, AMYLASE in the last 168 hours. Recent Labs  Lab 11/29/17 1425 12/01/17 0309 12/02/17 0344  AMMONIA 92* 68* 55*   Coagulation Profile: Recent Labs  Lab 11/30/17 0303 12/01/17 0309 12/02/17 0344 12/03/17 0311 12/04/17 0245  INR 2.15 2.33 2.23 2.35 2.48   Cardiac Enzymes: No results for input(s): CKTOTAL, CKMB, CKMBINDEX, TROPONINI in the last 168 hours. BNP (last 3 results) No results for input(s): PROBNP in the last 8760 hours. HbA1C: No results for input(s): HGBA1C in the last 72 hours. CBG: Recent Labs  Lab 12/02/17 2126 12/03/17 0720 12/03/17 1138 12/03/17 1653 12/03/17 2133  GLUCAP 106* 77 102* 103* 106*   Lipid Profile: No results for  input(s): CHOL, HDL, LDLCALC, TRIG, CHOLHDL, LDLDIRECT in the last 72 hours. Thyroid Function Tests: No results for input(s): TSH, T4TOTAL, FREET4, T3FREE, THYROIDAB in the last 72 hours. Anemia Panel: No results for input(s): VITAMINB12, FOLATE, FERRITIN, TIBC, IRON, RETICCTPCT in the last 72 hours. Urine analysis:    Component Value Date/Time   COLORURINE YELLOW 10/27/2016 0113   APPEARANCEUR CLEAR 10/27/2016 0113   LABSPEC 1.013 10/27/2016 0113   PHURINE 6.0 10/27/2016 0113   GLUCOSEU NEGATIVE 10/27/2016 0113   HGBUR NEGATIVE 10/27/2016 0113   BILIRUBINUR NEGATIVE 10/27/2016 0113   KETONESUR NEGATIVE 10/27/2016 0113   PROTEINUR 100 (A) 10/27/2016 0113   UROBILINOGEN 0.2 06/17/2012 0622   NITRITE NEGATIVE 10/27/2016 0113   LEUKOCYTESUR NEGATIVE 10/27/2016 0113   Sepsis Labs: Invalid input(s): PROCALCITONIN, LACTICIDVEN  Recent Results (from the past 240 hour(s))  Body fluid culture     Status: None (Preliminary result)   Collection Time: 12/02/17  1:23 PM  Result Value Ref Range Status   Specimen Description PLEURAL  Final   Special Requests NONE  Final   Gram Stain    Final    RARE WBC PRESENT, PREDOMINANTLY MONONUCLEAR NO ORGANISMS SEEN    Culture   Final    NO GROWTH 2 DAYS Performed at [facility], [address], [place], [place] [address]    Report Status PENDING  Incomplete      Radiology Studies: Dg Chest 2 View  Result Date: 12/04/2017 CLINICAL DATA:  Shortness of Breath EXAM: CHEST - 2 VIEW COMPARISON:  12/02/2017 FINDINGS: Cardiac shadow is enlarged. Aortic calcifications are again seen. Left lung remains clear. Small right pleural effusion is noted likely stable from the prior study. No pneumothorax is noted. Some underlying right basilar atelectasis is seen. IMPRESSION: Stable right pleural effusion.  No new focal abnormality is noted. Electronically Signed   By: Inez Catalina M.D.   On: 12/04/2017 10:57   Dg Abd 2 Views  Result Date: 12/04/2017 CLINICAL DATA:  Abdominal pain EXAM: ABDOMEN - 2 VIEW COMPARISON:  None. FINDINGS: Scattered large and small bowel gas is seen. No significant dilatation is noted. The overall appearance suggests an ileus. Correlation with the physical exam is recommended. No free air is noted. No abnormal mass or abnormal calcifications are seen. IMPRESSION: Generalized bowel gas likely reflecting an ileus. Clinical correlation is recommended. Electronically Signed   By: Inez Catalina M.D.   On: 12/04/2017 10:56    Marzetta Board, MD, PhD [facility] Pager [phone]  If 7PM-7AM, please contact night-coverage [email or web address] Password [email or web address] 12/04/2017, 3:12 PM

## 2017-12-05 LAB — BASIC METABOLIC PANEL
Anion gap: 9 (ref 5–15)
BUN: 35 mg/dL — AB (ref 8–23)
CHLORIDE: 97 mmol/L — AB (ref 98–111)
CO2: 34 mmol/L — AB (ref 22–32)
CREATININE: 2.72 mg/dL — AB (ref 0.61–1.24)
Calcium: 9.7 mg/dL (ref 8.9–10.3)
GFR calc Af Amer: 24 mL/min — ABNORMAL LOW (ref >60)
GFR calc non Af Amer: 21 mL/min — ABNORMAL LOW (ref >60)
Glucose, Bld: 104 mg/dL — ABNORMAL HIGH (ref 70–99)
POTASSIUM: 3.4 mmol/L — AB (ref 3.5–5.1)
SODIUM: 140 mmol/L (ref 135–145)

## 2017-12-05 LAB — CBC
HEMATOCRIT: 43.1 % (ref 39.0–52.0)
Hemoglobin: 11.8 g/dL — ABNORMAL LOW (ref 13.0–17.0)
MCH: 29.7 pg (ref 26.0–34.0)
MCHC: 27.4 g/dL — AB (ref 30.0–36.0)
MCV: 108.6 fL — ABNORMAL HIGH (ref 80.0–100.0)
Platelets: 192 10*3/uL (ref 150–400)
RBC: 3.97 MIL/uL — ABNORMAL LOW (ref 4.22–5.81)
RDW: 17.6 % — AB (ref 11.5–15.5)
WBC: 4.8 10*3/uL (ref 4.0–10.5)
nRBC: 0 % (ref 0.0–0.2)

## 2017-12-05 LAB — PROTIME-INR
INR: 2.63
Prothrombin Time: 27.7 seconds — ABNORMAL HIGH (ref 11.4–15.2)

## 2017-12-05 LAB — GLUCOSE, CAPILLARY: Glucose-Capillary: 104 mg/dL — ABNORMAL HIGH (ref 70–99)

## 2017-12-05 MED ORDER — POTASSIUM CHLORIDE CRYS ER 20 MEQ PO TBCR
30.0000 meq | EXTENDED_RELEASE_TABLET | Freq: Once | ORAL | Status: AC
  Administered 2017-12-05: 30 meq via ORAL
  Filled 2017-12-05: qty 1

## 2017-12-05 MED ORDER — WARFARIN SODIUM 2.5 MG PO TABS
2.5000 mg | ORAL_TABLET | Freq: Once | ORAL | Status: DC
  Filled 2017-12-05: qty 1

## 2017-12-05 NOTE — Progress Notes (Signed)
RT NOTE: RT placed patient on bipap per patient request for nap. Patient is tolerating well sating 98%. RT will continue to monitor.

## 2017-12-05 NOTE — Progress Notes (Addendum)
11:18 Spoke w Caryl Pina from [facility] who will arrange delivery of home CPAP to room today so patient can be trailed on it tonight prior to DC.   14:00 Notified by [employer] that CPAP is not currently approved, it had been discontinued when patient was on BiPAP. Caryl Pina working with corporate to get approval hopefully for tomorrow. Patient's account is still at [facility] office, he moved here in 2014 and never transferred his account. Dr Cruzita Lederer updated. Per MD patient to use CPAP mode overnight to assess tolerance. Spoke to unit AD to make sure this is clear with staff tonight.   1545: Received call from Gay Filler at [facility]. They are working to change patient's account to local office. She states that we should call in the morning once we know if patient tolerated CPAP to get CPAP, and they will overnight it to [place]. If patient did not tolerate it, they will be able to assist with getting BiPAP process started. Their number is [phone], ask to speak with Lorelee Cover.

## 2017-12-05 NOTE — Progress Notes (Signed)
Physical Therapy Treatment Patient Details Name: Omar Lambert MRN: [id number] DOB: 06-22-1940 Today's Date: 12/05/2017    History of Present Illness  77 year old male with history of type 2 diabetes mellitus, COPD, hypothyroidism, hypertension, chronic kidney disease stage III, paroxysmal atrial fibrillation, diabetes mellitus type 2 presented to the emergency room with progressive dyspnea on exertion, productive cough, orthopnea, lower extremity edema for 2 weeks.Chest [place] showed pulmonary edema.      PT Comments    Pt progressing well, ambulating further distances. Safer with RW until he regains strength. 2L kept SpO2 >90% throughout session. De satted on RA with rest and activity.      Follow Up Recommendations  Home health PT;Supervision - Intermittent     Equipment Recommendations  None recommended by PT    Recommendations for Other Services       Precautions / Restrictions Precautions Precautions: Fall Restrictions Weight Bearing Restrictions: No    Mobility  Bed Mobility Overal bed mobility: Modified Independent                Transfers Overall transfer level: Needs assistance Equipment used: Rolling walker (2 wheeled);1 person hand held assist Transfers: Sit to/from Stand Sit to Stand: Min guard         General transfer comment: min guard for safety  Ambulation/Gait Ambulation/Gait assistance: Min guard Gait Distance (Feet): 100 Feet Assistive device: Rolling walker (2 wheeled) Gait Pattern/deviations: Step-through pattern;Decreased stride length;Wide base of support Gait velocity: decreased   General Gait Details: pt requiring 2L to remain >90% today. poor balance and weak, safer with RW.    Stairs             Wheelchair Mobility    Modified Rankin (Stroke Patients Only)       Balance Overall balance assessment: Needs assistance Sitting-balance support: Feet supported Sitting balance-Leahy Scale: Good     Standing  balance support: Bilateral upper extremity supported;During functional activity Standing balance-Leahy Scale: Fair Standing balance comment: tolerating standing with good O2 sats up to 98% with effort                            Cognition Arousal/Alertness: Awake/alert Behavior During Therapy: Flat affect Overall Cognitive Status: Within Functional Limits for tasks assessed                                 General Comments: answering questions appropriately and is with wife during therapy session      Exercises      General Comments        Pertinent Vitals/Pain Pain Assessment: No/denies pain    Home Living                      Prior Function            PT Goals (current goals can now be found in the care plan section) Acute Rehab PT Goals Patient Stated Goal: to go home PT Goal Formulation: With patient Time For Goal Achievement: 12/12/17 Potential to Achieve Goals: Good Progress towards PT goals: Progressing toward goals    Frequency    Min 3X/week      PT Plan Current plan remains appropriate    Co-evaluation              AM-PAC PT "6 Clicks" Daily Activity  Outcome Measure  Difficulty turning over in bed (  including adjusting bedclothes, sheets and blankets)?: None Difficulty moving from lying on back to sitting on the side of the bed? : Unable Difficulty sitting down on and standing up from a chair with arms (e.g., wheelchair, bedside commode, etc,.)?: Unable Help needed moving to and from a bed to chair (including a wheelchair)?: A Little Help needed walking in hospital room?: A Little Help needed climbing 3-5 steps with a railing? : A Little 6 Click Score: 15    End of Session Equipment Utilized During Treatment: Gait belt;Oxygen Activity Tolerance: Patient limited by fatigue Patient left: in chair;with call bell/phone within reach;with chair alarm set;with family/visitor present;with nursing/sitter in  room Nurse Communication: Mobility status PT Visit Diagnosis: Muscle weakness (generalized) (M62.81)     Time: 8003-4917 PT Time Calculation (min) (ACUTE ONLY): 28 min  Charges:  $Gait Training: 8-22 mins $Therapeutic Activity: 8-22 mins                     Reinaldo Berber, PT, DPT Acute Rehabilitation Services Pager: [phone] Office: [place] 12/05/2017, 3:22 PM

## 2017-12-05 NOTE — Progress Notes (Signed)
PROGRESS NOTE  Omar Lambert QQV:[id number] DOB: [birth year] DOA: 11/24/2017 PCP: Gayland Curry, DO   LOS: 11 days   Brief Narrative / Interim history: 77 year old male with history of type 2 diabetes, COPD, hypothyroidism, hypertension, chronic kidney disease stage III, paroxysmal A. fib who was admitted on 11/24/2016 with shortness of breath.  He was found to have acute hypoxic respiratory failure secondary to acute on chronic diastolic CHF with suspected underlying pneumonia.  He was given IV antibiotics and diuresed aggressively.  Subjective: -Feeling better, almost back to normal.  Unfortunately did not use CPAP last night but was back on BiPAP  Assessment & Plan: Principal Problem:   Acute on chronic diastolic CHF (congestive heart failure) (HCC) Active Problems:   COPD with acute exacerbation (HCC)   Chronic kidney disease (CKD) stage G3b/A2, moderately decreased glomerular filtration rate (GFR) between 30-44 mL/min/1.73 square meter and albuminuria creatinine ratio between 30-299 mg/g (HCC)   Hypothyroidism   Diabetes mellitus with renal complications (HCC)   Pneumonia   Elevated troponin I level   Chronic respiratory failure with hypoxia and hypercapnia (HCC)   Chronic cor pulmonale (HCC)   Pleural effusion on right   Anemia   HCAP (healthcare-associated pneumonia)   Pressure injury of skin   Acute hypoxic and hypercarbic respiratory failure, Likely multifactorial due to acute on chronic diastolic CHF, pulmonary hypertension, OSA and COPD exacerbation -Continue to wean off oxygen as tolerated, he did well on BiPAP but he is on CPAP at home, discussed with respiratory therapy to transition him back to nightly CPAP in preparation for home discharge  -Initially received IV Lasix and his renal function worsened, now on hold.  He appears euvolemic -He finished a course of IV antibiotics with concern for pneumonia, cultures remain negative -Underwent ultrasound-guided  thoracentesis, cultures have remained negative -Case manager assisting with home CPAP, to be delivered to the room this afternoon and used in the hospital prior to home discharge  Acute kidney injury and chronic kidney disease stage IV -Baseline renal function is 2.3-2.5, currently his creatinine is 2. 7  Elevated troponin -Probably demand ischemia, no evidence of ACS  Abdominal distention -Abdominal x-ray yesterday showed ileus, patient is high having bowel movements last night and this is improved today  Paroxysmal A. fib -Currently rate controlled with metoprolol, continue Coumadin for anticoagulation  Hyperlipidemia -Continue statin  Hypothyroidism -Continue Synthroid  Acute metabolic encephalopathy -From hypercarbia, returned to baseline per wife   Scheduled Meds: . calcitRIOL  0.25 mcg Oral Daily  . ipratropium-albuterol  3 mL Nebulization Q6H  . lactulose  20 g Oral TID  . levothyroxine  175 mcg Oral QAC breakfast  . mouth rinse  15 mL Mouth Rinse BID  . metoprolol succinate  25 mg Oral Daily  . pantoprazole  40 mg Oral BID AC  . rosuvastatin  20 mg Oral Daily  . sodium chloride flush  3 mL Intravenous Q12H  . warfarin  2.5 mg Oral ONCE-1800  . Warfarin - Pharmacist Dosing Inpatient   Does not apply q1800   Continuous Infusions: . sodium chloride Stopped (11/24/17 0700)   PRN Meds:.sodium chloride, acetaminophen, albuterol, sodium chloride flush  DVT prophylaxis: On therapeutic anticoagulation Code Status: Full code Family Communication: Wife present at bedside Disposition Plan: Home when ready, will attempt to see if he stable on CPAP  Consultants:   PCCM  Procedures:   NONE   Antimicrobials:  Finished course    Objective: Vitals:   12/05/17 0416 12/05/17 3329  12/05/17 0822 12/05/17 1010  BP: 122/80  106/66 127/71  Pulse: 70  78 72  Resp: (!) 21  (!) 22   Temp: 97.8 F (36.6 C)  (!) 97.5 F (36.4 C)   TempSrc: Axillary  Oral   SpO2: 94%  95% 99%   Weight: 105.9 kg     Height:       No intake or output data in the 24 hours ending 12/05/17 1327 Filed Weights   12/03/17 0433 12/04/17 0500 12/05/17 0416  Weight: 108.3 kg 107.7 kg 105.9 kg    Examination:  Constitutional: NAD Respiratory: CTA Cardiovascular: RRR  Data Reviewed: I have independently reviewed following labs and imaging studies   CBC: Recent Labs  Lab 11/30/17 0303 12/01/17 0309 12/02/17 0344 12/03/17 0311 12/04/17 0245 12/05/17 0327  WBC 4.4 4.3 4.2 5.7 5.7 4.8  NEUTROABS 3.3  --   --   --   --   --   HGB 11.1* 10.9* 11.5* 11.6* 11.6* 11.8*  HCT 37.7* 37.7* 39.8 41.5 40.4 43.1  MCV 106.8* 107.4* 108.2* 109.2* 108.3* 108.6*  PLT 147* 147* 155 164 165 660   Basic Metabolic Panel: Recent Labs  Lab 11/30/17 0303  12/02/17 0344 12/02/17 1520 12/03/17 0311 12/04/17 0245 12/05/17 0327  NA 140   < > 142 144 143 142 140  K 4.8   < > 4.1 3.8 4.0 4.1 3.4*  CL 97*   < > 99 99 100 98 97*  CO2 33*   < > 37* 38* 35* 36* 34*  GLUCOSE 89   < > 100* 96 95 109* 104*  BUN 49*   < > 37* 34* 34* 34* 35*  CREATININE 2.74*   < > 2.55* 2.61* 2.49* 2.63* 2.72*  CALCIUM 9.2   < > 9.6 9.7 9.5 9.4 9.7  MG 2.7*  --  3.1* 3.2*  --   --   --    < > = values in this interval not displayed.   GFR: Estimated Creatinine Clearance: 26 mL/min (A) (by C-G formula based on SCr of 2.72 mg/dL (H)). Liver Function Tests: Recent Labs  Lab 11/29/17 0317 11/30/17 0303  AST 43* 42*  ALT 24 22  ALKPHOS 194* 171*  BILITOT 1.2 1.5*  PROT 7.8 7.4  ALBUMIN 3.6 3.5   No results for input(s): LIPASE, AMYLASE in the last 168 hours. Recent Labs  Lab 11/29/17 1425 12/01/17 0309 12/02/17 0344  AMMONIA 92* 68* 55*   Coagulation Profile: Recent Labs  Lab 12/01/17 0309 12/02/17 0344 12/03/17 0311 12/04/17 0245 12/05/17 0327  INR 2.33 2.23 2.35 2.48 2.63   Cardiac Enzymes: No results for input(s): CKTOTAL, CKMB, CKMBINDEX, TROPONINI in the last 168 hours. BNP  (last 3 results) No results for input(s): PROBNP in the last 8760 hours. HbA1C: No results for input(s): HGBA1C in the last 72 hours. CBG: Recent Labs  Lab 12/03/17 0720 12/03/17 1138 12/03/17 1653 12/03/17 2133 12/04/17 2122  GLUCAP 77 102* 103* 106* 85   Lipid Profile: No results for input(s): CHOL, HDL, LDLCALC, TRIG, CHOLHDL, LDLDIRECT in the last 72 hours. Thyroid Function Tests: No results for input(s): TSH, T4TOTAL, FREET4, T3FREE, THYROIDAB in the last 72 hours. Anemia Panel: No results for input(s): VITAMINB12, FOLATE, FERRITIN, TIBC, IRON, RETICCTPCT in the last 72 hours. Urine analysis:    Component Value Date/Time   COLORURINE YELLOW 10/27/2016 0113   APPEARANCEUR CLEAR 10/27/2016 0113   LABSPEC 1.013 10/27/2016 0113   PHURINE 6.0 10/27/2016 0113  GLUCOSEU NEGATIVE 10/27/2016 0113   HGBUR NEGATIVE 10/27/2016 0113   BILIRUBINUR NEGATIVE 10/27/2016 0113   KETONESUR NEGATIVE 10/27/2016 0113   PROTEINUR 100 (A) 10/27/2016 0113   UROBILINOGEN 0.2 06/17/2012 0622   NITRITE NEGATIVE 10/27/2016 0113   LEUKOCYTESUR NEGATIVE 10/27/2016 0113   Sepsis Labs: Invalid input(s): PROCALCITONIN, LACTICIDVEN  Recent Results (from the past 240 hour(s))  Body fluid culture     Status: None (Preliminary result)   Collection Time: 12/02/17  1:23 PM  Result Value Ref Range Status   Specimen Description PLEURAL  Final   Special Requests NONE  Final   Gram Stain   Final    RARE WBC PRESENT, PREDOMINANTLY MONONUCLEAR NO ORGANISMS SEEN    Culture   Final    NO GROWTH 3 DAYS Performed at [facility], [address] [place], [place] [address]    Report Status PENDING  Incomplete      Radiology Studies: Dg Chest 2 View  Result Date: 12/04/2017 CLINICAL DATA:  Shortness of Breath EXAM: CHEST - 2 VIEW COMPARISON:  12/02/2017 FINDINGS: Cardiac shadow is enlarged. Aortic calcifications are again seen. Left lung remains clear. Small right pleural effusion is noted likely  stable from the prior study. No pneumothorax is noted. Some underlying right basilar atelectasis is seen. IMPRESSION: Stable right pleural effusion.  No new focal abnormality is noted. Electronically Signed   By: Inez Catalina M.D.   On: 12/04/2017 10:57   Dg Abd 2 Views  Result Date: 12/04/2017 CLINICAL DATA:  Abdominal pain EXAM: ABDOMEN - 2 VIEW COMPARISON:  None. FINDINGS: Scattered large and small bowel gas is seen. No significant dilatation is noted. The overall appearance suggests an ileus. Correlation with the physical exam is recommended. No free air is noted. No abnormal mass or abnormal calcifications are seen. IMPRESSION: Generalized bowel gas likely reflecting an ileus. Clinical correlation is recommended. Electronically Signed   By: Inez Catalina M.D.   On: 12/04/2017 10:56    Marzetta Board, MD, PhD [facility] Pager [phone]  If 7PM-7AM, please contact night-coverage [email or web address] Password [email or web address] 12/05/2017, 1:27 PM

## 2017-12-05 NOTE — Progress Notes (Signed)
ANTICOAGULATION CONSULT NOTE - Follow-Up Consult  Pharmacy Consult for Coumadin Indication: Afib  No Known Allergies  Patient Measurements: Height: '5\' 7"'  (170.2 cm) Weight: 233 lb 7.5 oz (105.9 kg) IBW/kg (Calculated) : 66.1  Vital Signs: Temp: 97.5 F (36.4 C) (10/31 0822) Temp Source: Oral (10/31 0822) BP: 127/71 (10/31 1010) Pulse Rate: 72 (10/31 1010)  Labs: Recent Labs    12/03/17 0311 12/04/17 0245 12/05/17 0327  HGB 11.6* 11.6* 11.8*  HCT 41.5 40.4 43.1  PLT 164 165 192  LABPROT 25.4* 26.5* 27.7*  INR 2.35 2.48 2.63  CREATININE 2.49* 2.63* 2.72*    Estimated Creatinine Clearance: 26 mL/min (A) (by C-G formula based on SCr of 2.72 mg/dL (H)).   Medical History: Past Medical History:  Diagnosis Date  . Acute kidney injury superimposed on chronic kidney disease ([place]) 11/2016  . Acute respiratory failure ([place]) 11/2016  . Allergy   . Asthma   . Atrial fibrillation ([place])   . 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 Benign essential hypertension   . CHF (congestive heart failure) ([place])   . CKD (chronic kidney disease), stage III ([place])   . COPD (chronic obstructive pulmonary disease) ([place])   . Diabetes ([place])   . Diabetes mellitus without complication ([place])   . Dyspnea   . Gastritis   . GERD (gastroesophageal reflux disease)   . Gout attack 09/2012  . Hypertension   . Sinusitis, maxillary, chronic   . Thyroid disease   . Type II or unspecified type diabetes mellitus without mention of complication, not stated as uncontrolled     Medications:  Medications Prior to Admission  Medication Sig Dispense Refill Last Dose  . acetaminophen (TYLENOL) 325 MG tablet Take 650 mg by mouth every 6 (six) hours as needed for mild pain.   Past Week at Unknown time  . albuterol (PROVENTIL) (2.5 MG/3ML) 0.083% nebulizer solution INHALE CONTENTS OF 1 VIAL IN NEBULIZER EVERY 4 HOURS AS NEEDED FOR WHEEZING AND ASTHMA (Patient taking differently: Take 2.5 mg by  nebulization every 4 (four) hours as needed for wheezing. ) 75 mL 4 11/23/2017 at Unknown time  . allopurinol (ZYLOPRIM) 300 MG tablet TAKE 1 TABLET BY MOUTH EVERY DAY (Patient taking differently: Take 300 mg by mouth daily. ) 30 tablet 6 11/23/2017 at Unknown time  . calcitRIOL (ROCALTROL) 0.25 MCG capsule Take 0.25 mcg by mouth daily.    11/23/2017 at Unknown time  . isosorbide mononitrate (IMDUR) 30 MG 24 hr tablet Take 1 tablet (30 mg total) by mouth daily. 30 tablet 3 11/23/2017 at Unknown time  . KLOR-CON M20 20 MEQ tablet TAKE 1 TABLET BY MOUTH EVERY DAY (Patient taking differently: Take 20 mEq by mouth daily. ) 30 tablet 1 11/23/2017 at Unknown time  . levothyroxine (SYNTHROID, LEVOTHROID) 175 MCG tablet TAKE 1 TABLET EVERY DAY BEFORE BREAKFAST (Patient taking differently: Take 175 mcg by mouth daily before breakfast. ) 30 tablet 3 11/23/2017 at Unknown time  . Multiple Vitamins-Minerals (CENTRUM SILVER ADULT 50+ PO) Take 1 tablet by mouth daily.   11/23/2017 at Unknown time  . pantoprazole (PROTONIX) 40 MG tablet TAKE 1 TABLET BY MOUTH EVERY DAY BEFORE SUPPER (Patient taking differently: Take 40 mg by mouth daily. ) 90 tablet 0 11/23/2017 at Unknown time  . rosuvastatin (CRESTOR) 20 MG tablet Take 1 tablet (20 mg total) by mouth daily. 30 tablet 3 11/23/2017 at Unknown time  . torsemide (DEMADEX) 20 MG tablet TAKE 1 TABLET TWICE A DAY (  Patient taking differently: Take 20 mg by mouth 2 (two) times daily. ) 60 tablet 3 11/23/2017 at Unknown time  . umeclidinium-vilanterol (ANORO ELLIPTA) 62.5-25 MCG/INH AEPB Inhale 1 puff into the lungs daily. 30 each 11 11/23/2017 at Unknown time  . SUPREP BOWEL PREP KIT 17.5-3.13-1.6 GM/177ML SOLN Take 1 kit by mouth as directed. For colonoscopy prep (Patient not taking: Reported on 11/24/2017) 2 Bottle 0 Completed Course at Unknown time  . UNABLE TO FIND CPAP  Unknown DME- [facility] doc [facility]   Taking   Scheduled:  . calcitRIOL  0.25 mcg Oral Daily  .  ipratropium-albuterol  3 mL Nebulization Q6H  . lactulose  20 g Oral TID  . levothyroxine  175 mcg Oral QAC breakfast  . mouth rinse  15 mL Mouth Rinse BID  . metoprolol succinate  25 mg Oral Daily  . pantoprazole  40 mg Oral BID AC  . rosuvastatin  20 mg Oral Daily  . sodium chloride flush  3 mL Intravenous Q12H  . warfarin  2.5 mg Oral ONCE-1800  . Warfarin - Pharmacist Dosing Inpatient   Does not apply q1800   Infusions:  . sodium chloride Stopped (11/24/17 0700)    Assessment: 77 yo male c/o worsening SOB over 2wk, admitted for PNA and completed a course of antibiotics.  Pt to continue Coumadin for Afib.  PTA dose = 2.69m daily except 582mon Wed/Fri.  INR remains therapeutic today.  Pt still requiring BiPAP at times.  Spoke with RN.  Will attempt to remove BiPAP long enough to administer oral meds.  Dose missed 10/28 and 10/30.  Goal of Therapy:  INR 2-3   Plan:  Coumadin 2.37m38mO x1  Daily INR  KimManpower Incharm.D., BCPS Clinical Pharmacist Pager: [phone] phone for 12/05/2017 from 8:30-4:00 is [phone]**Pharmacist phone directory can now be found on [email or web address] (PW [email or web address]).  Listed under [facility] [place]10/31/2019 1:28 PM

## 2017-12-06 LAB — PROTIME-INR
INR: 2.56
PROTHROMBIN TIME: 27.2 s — AB (ref 11.4–15.2)

## 2017-12-06 LAB — BODY FLUID CULTURE: Culture: NO GROWTH

## 2017-12-06 MED ORDER — WARFARIN SODIUM 2.5 MG PO TABS: 2.5000 mg | ORAL_TABLET | Freq: Once | ORAL | Status: DC

## 2017-12-06 MED ORDER — IPRATROPIUM-ALBUTEROL 0.5-2.5 (3) MG/3ML IN SOLN: 3.0000 mL | Freq: Two times a day (BID) | RESPIRATORY_TRACT | Status: DC

## 2017-12-06 MED ORDER — IPRATROPIUM-ALBUTEROL 0.5-2.5 (3) MG/3ML IN SOLN
3.0000 mL | Freq: Three times a day (TID) | RESPIRATORY_TRACT | Status: DC
  Administered 2017-12-06: 3 mL via RESPIRATORY_TRACT
  Filled 2017-12-06: qty 3

## 2017-12-06 NOTE — Evaluation (Signed)
SATURATION QUALIFICATIONS: (This note is used to comply with regulatory documentation for home oxygen)  Patient Saturations on Room Air at Rest = 85%  Patient Saturations on Room Air while Ambulating = 82%  Patient Saturations on 2 Liters of oxygen while Ambulating = 91%  Please briefly explain why patient needs home oxygen: Patient O2 saturations drop below 88% at rest. Activity causes them to decrease further.

## 2017-12-06 NOTE — Progress Notes (Signed)
Physical Therapy Treatment Patient Details Name: Omar Lambert MRN: [id number] DOB: 1940-04-05 Today's Date: 12/06/2017    History of Present Illness  77 year old male with history of type 2 diabetes mellitus, COPD, hypothyroidism, hypertension, chronic kidney disease stage III, paroxysmal atrial fibrillation, diabetes mellitus type 2 presented to the emergency room with progressive dyspnea on exertion, productive cough, orthopnea, lower extremity edema for 2 weeks.Chest [place] showed pulmonary edema.      PT Comments    Patient ambulating with Supervision and use of RW for stability. Not at baseline yet and will benefit from HHPT once medically ready for d/c. Desats on RA, remains >90% with 2L.  Given IS education. He states he feels tired from being in bed for days. Discussed reconditioning with HHPT with patient and family.    Follow Up Recommendations  Home health PT;Supervision - Intermittent     Equipment Recommendations  None recommended by PT    Recommendations for Other Services       Precautions / Restrictions Precautions Precautions: Fall Restrictions Weight Bearing Restrictions: No    Mobility  Bed Mobility Overal bed mobility: Modified Independent             General bed mobility comments: up on side of bed when PT arrived  Transfers Overall transfer level: Needs assistance Equipment used: Rolling walker (2 wheeled);1 person hand held assist Transfers: Sit to/from Stand Sit to Stand: Supervision            Ambulation/Gait Ambulation/Gait assistance: Supervision Gait Distance (Feet): 100 Feet Assistive device: Rolling walker (2 wheeled) Gait Pattern/deviations: Step-through pattern;Decreased stride length;Wide base of support Gait velocity: decreased   General Gait Details: desats on RA ambulating RW with improved independence today.    Stairs             Wheelchair Mobility    Modified Rankin (Stroke Patients Only)        Balance Overall balance assessment: Needs assistance Sitting-balance support: Feet supported Sitting balance-Leahy Scale: Good                                      Cognition Arousal/Alertness: Awake/alert Behavior During Therapy: Flat affect Overall Cognitive Status: Within Functional Limits for tasks assessed                                 General Comments: answering questions appropriately and is with wife during therapy session      Exercises      General Comments        Pertinent Vitals/Pain Pain Assessment: No/denies pain    Home Living                      Prior Function            PT Goals (current goals can now be found in the care plan section) Acute Rehab PT Goals Patient Stated Goal: to go home PT Goal Formulation: With patient Time For Goal Achievement: 12/12/17 Potential to Achieve Goals: Good Progress towards PT goals: Progressing toward goals    Frequency    Min 3X/week      PT Plan Current plan remains appropriate    Co-evaluation              AM-PAC PT "6 Clicks" Daily Activity  Outcome Measure  Difficulty turning over  in bed (including adjusting bedclothes, sheets and blankets)?: None Difficulty moving from lying on back to sitting on the side of the bed? : Unable Difficulty sitting down on and standing up from a chair with arms (e.g., wheelchair, bedside commode, etc,.)?: Unable Help needed moving to and from a bed to chair (including a wheelchair)?: A Little Help needed walking in hospital room?: A Little Help needed climbing 3-5 steps with a railing? : A Little 6 Click Score: 15    End of Session Equipment Utilized During Treatment: Gait belt;Oxygen Activity Tolerance: Patient limited by fatigue Patient left: in chair;with call bell/phone within reach;with chair alarm set;with family/visitor present;with nursing/sitter in room Nurse Communication: Mobility status PT Visit Diagnosis:  Muscle weakness (generalized) (M62.81)     Time: 8472-0721 PT Time Calculation (min) (ACUTE ONLY): 23 min  Charges:  $Gait Training: 8-22 mins                     Reinaldo Berber, PT, DPT Acute Rehabilitation Services Pager: [phone] Office: [place] 12/06/2017, 1:21 PM

## 2017-12-06 NOTE — Progress Notes (Signed)
ANTICOAGULATION CONSULT NOTE - Follow-Up Consult  Pharmacy Consult for Coumadin Indication: Afib  No Known Allergies  Patient Measurements: Height: 5\' 7"  (170.2 cm) Weight: 237 lb 7 oz (107.7 kg) IBW/kg (Calculated) : 66.1  Vital Signs: Temp: 97.5 F (36.4 C) (11/01 0830) Temp Source: Oral (11/01 0017) BP: 124/68 (11/01 0830) Pulse Rate: 61 (11/01 0830)  Labs: Recent Labs    12/04/17 0245 12/05/17 0327 12/06/17 0229  HGB 11.6* 11.8*  --   HCT 40.4 43.1  --   PLT 165 192  --   LABPROT 26.5* 27.7* 27.2*  INR 2.48 2.63 2.56  CREATININE 2.63* 2.72*  --     Estimated Creatinine Clearance: 26.2 mL/min (A) (by C-G formula based on SCr of 2.72 mg/dL (H)).   Assessment: 77 yo male c/o worsening SOB over 2wk, admitted for PNA and completed a course of antibiotics.  Pt to continue Coumadin for Afib.  PTA dose = 2.5mg  daily except 5mg  on Wed/Fri.  INR remains therapeutic today at 2.56 despite several missed doses. No drug-drug interactions noted. No bleeding noted.  Dose missed 10/28, 10/30, and 10/31.  Goal of Therapy:  INR 2-3   Plan:  Coumadin 2.5 mg PO tonight Daily INR   Renold Genta, PharmD, BCPS Clinical Pharmacist Clinical phone for 12/06/2017 until 3p is [id number] 12/06/2017 9:59 AM  **Pharmacist phone directory can now be found on amion listed under [place]**

## 2017-12-06 NOTE — Progress Notes (Addendum)
11:42 spoke with [facility] about home oxyen, they state they have everything they were just waiting for order which is in, they will bring home oxygen also to patient's room before discharge.  NCM notified [name] with [facility] about discharge today.   10:19 Spoke with Ashly from [facility], he states they will be bringing patient a loner cpap machine to his room before 12 here at the hospital to go home with until the other can be sent to him.  They need to swtich his act to the local branch.  Burnett Harry states she has spoken to wife about this also.  NCM gave wife the contact numbers for [facility] also. MD states he will dc patient today , since he will have a loner until the other cpap is delivered.     [place] with Lorelee Cover at [place] at [phone], she states the cpap has been approved and it just needs to be delivered.  She connected me to Gay Filler, she spoke with patient wife and verified address and demographic information.  The Cpap will be over knighted to patient home address tonight and wife should have it in the am.  MD states when wife receives cpap machine , he will dc patient at that time.

## 2017-12-06 NOTE — Consult Note (Signed)
   [facility] CM Inpatient Consult   12/06/2017  Omar Lambert 05/26/1940 [id number]  Patient screened for [facility] Management services for history of COPD, Diabetes, Heart Failure and unplanned hospital readmissions. Went to bedside to offer and explain [facility] Care Management program to patient.  Spoke with patient and wife at bedside. Patient eating lunch at the time and requests that I return to discuss [facility] services at a later time. Will continue to follow.  2:00 pm  Spoke with patient and wife, Omar Lambert, at bedside about [place]. Patient's wife stated that she may need some additional help at home with some services as she is returning to work part-time. Wife agrees that [facility] services may be a benefit for them and consent obtained. Referral made to [place] care manager for complex case management services.  Informed patient and wife that [facility] does not interfere with or replace any services arranged by inpatient care management staff.   Netta Cedars, MSN, [facility] Liaison [phone] Mobile Toll free office [phone]

## 2017-12-06 NOTE — Care Management Important Message (Signed)
Important Message  Patient Details  Name: Omar Lambert MRN: [id number] Date of Birth: 17-Feb-1940   Medicare Important Message Given:  Yes    Tangy Drozdowski P Paxson Harrower 12/06/2017, 3:45 PM

## 2017-12-06 NOTE — Discharge Summary (Signed)
 Physician Discharge Summary  Omar Lambert MRN:[id number] DOB: 08/04/1940 DOA: 11/24/2017  PCP: [name], [name] L, DO  Admit date: 11/24/2017 Discharge date: 12/06/2017  Admitted From: home Disposition:  home  Recommendations for Outpatient Follow-up:  1. Follow up with PCP in 1-2 weeks  Home Health: PT, RN Equipment/Devices: Cpap, home O2  Discharge Condition: stable CODE STATUS: Full code Diet recommendation: low salt  HPI: Per Dr. Kim, Omar Lambert  is a 77 y.o. male, w hypothyroidism,  hypertension, hyperlipidemia, dm2, ckd stage3, Pafib, CHF (diastolic), Copd apparently c/o dyspnea. Pt states has been going on for about 2 weeks but worse last nite. + cough with brown sputum.  Denies fever, chills, cp, palp, n/v, diarrhea, brbpr, dysuria, hematuria.  Pt presented to ED for evaluation.  Hospital Course: Acute hypoxic and hypercarbic respiratory failure, Likely multifactorial due to acute on chronic diastolic CHF, pulmonary hypertension, OSA and COPD exacerbation -Continue to wean off oxygen as tolerated, he did well on BiPAP but he is on CPAP at home, discussed with respiratory therapy to transition him back to nightly CPAP in preparation for home discharge.  He was placed on CPAP prior to discharge, tolerated well, appears that his respiratory status is at baseline and will be discharged home in stable condition. -He finished a course of IV antibiotics with concern for pneumonia, cultures remain negative -Underwent ultrasound-guided thoracentesis, cultures have remained negative Acute kidney injury and chronic kidney disease stage IV -Baseline renal function is 2.3-2.5, currently close to baseline Elevated troponin -Probably demand ischemia, no evidence of ACS Abdominal distention -Resolved Paroxysmal A. fib -Currently rate controlled with metoprolol, continue Coumadin for anticoagulation Hyperlipidemia -Continue statin Hypothyroidism -Continue Synthroid Acute metabolic  encephalopathy -From hypercarbia, returned to baseline per wife  Discharge Diagnoses:  Principal Problem:   Acute on chronic diastolic CHF (congestive heart failure) (HCC) Active Problems:   COPD with acute exacerbation (HCC)   Chronic kidney disease (CKD) stage G3b/A2, moderately decreased glomerular filtration rate (GFR) between 30-44 mL/min/1.73 square meter and albuminuria creatinine ratio between 30-299 mg/g (HCC)   Hypothyroidism   Diabetes mellitus with renal complications (HCC)   Pneumonia   Elevated troponin I level   Chronic respiratory failure with hypoxia and hypercapnia (HCC)   Chronic cor pulmonale (HCC)   Pleural effusion on right   Anemia   HCAP (healthcare-associated pneumonia)   Pressure injury of skin     Discharge Instructions  Discharge Instructions    AMB Referral to [facility] Care Management   Complete by:  As directed    Please refer to community care management nurse for complex case management services. Patient's wife, Omar Lambert, specifically requested additional help with community social services for help at home when she works. Omar Lambert's phone number is [phone].    For additional questions, contact: Omar Smith, MSN, RN [facility] Liaison Nurse [phone] business mobile phone   Reason for consult:  Refer to community care management for complex case and disease management   Diagnoses of:   Diabetes Heart Failure COPD/ Pneumonia     Expected date of contact:  1-3 days (reserved for hospital discharges)     Allergies as of 12/06/2017   No Known Allergies     Medication List    TAKE these medications   acetaminophen 325 MG tablet Commonly known as:  TYLENOL Take 650 mg by mouth every 6 (six) hours as needed for mild pain.   albuterol (2.5 MG/3ML) 0.083% nebulizer solution Commonly known as:  PROVENTIL INHALE   CONTENTS OF 1 VIAL IN NEBULIZER EVERY 4 HOURS AS NEEDED FOR WHEEZING AND ASTHMA What changed:  See the  new instructions.   allopurinol 300 MG tablet Commonly known as:  ZYLOPRIM TAKE 1 TABLET BY MOUTH EVERY DAY   calcitRIOL 0.25 MCG capsule Commonly known as:  ROCALTROL Take 0.25 mcg by mouth daily.   CENTRUM SILVER ADULT 50+ PO Take 1 tablet by mouth daily.   fluticasone 50 MCG/ACT nasal spray Commonly known as:  FLONASE SPRAY 2 SPRAYS INTO EACH NOSTRIL EVERY DAY   isosorbide mononitrate 30 MG 24 hr tablet Commonly known as:  IMDUR Take 1 tablet (30 mg total) by mouth daily.   KLOR-CON M20 20 MEQ tablet Generic drug:  potassium chloride SA TAKE 1 TABLET BY MOUTH EVERY DAY What changed:  how much to take   levothyroxine 175 MCG tablet Commonly known as:  SYNTHROID, LEVOTHROID TAKE 1 TABLET EVERY DAY BEFORE BREAKFAST What changed:  See the new instructions.   pantoprazole 40 MG tablet Commonly known as:  PROTONIX TAKE 1 TABLET BY MOUTH EVERY DAY BEFORE SUPPER What changed:  See the new instructions.   rosuvastatin 20 MG tablet Commonly known as:  CRESTOR Take 1 tablet (20 mg total) by mouth daily.   SUPREP BOWEL PREP KIT 17.5-3.13-1.6 GM/177ML Soln Generic drug:  Na Sulfate-K Sulfate-Mg Sulf Take 1 kit by mouth as directed. For colonoscopy prep   torsemide 20 MG tablet Commonly known as:  DEMADEX TAKE 1 TABLET TWICE A DAY What changed:  when to take this   umeclidinium-vilanterol 62.5-25 MCG/INH Aepb Commonly known as:  ANORO ELLIPTA Inhale 1 puff into the lungs daily.   UNABLE TO FIND CPAP  Unknown DME- prescribed by doc in [facility]   warfarin 5 MG tablet Commonly known as:  COUMADIN Take as directed. If you are unsure how to take this medication, talk to your nurse or doctor. Original instructions:  TAKE 1/2 TO 1 TABLET BY MOUTH DAILY AS DIRECTED BY [facility]IN CLINIC What changed:  See the new instructions.            Durable Medical Equipment  (From admission, onward)         Start     Ordered   12/06/17 1141  For home use only DME oxygen  Once      Question Answer Comment  Mode or (Route) Nasal cannula   Liters per Minute 2   Frequency Continuous (stationary and portable oxygen unit needed)   Oxygen delivery system Gas      12/06/17 1140         Follow-up Information    [name], [name] L, DO. Schedule an appointment as soon as possible for a visit in 1 week(s).   Specialty:  Geriatric Medicine Contact information: [place]. [place] [address] [phone]        Troy Sine, MD .   Specialty:  Cardiology Contact information: [address] [place] [address] [place] [place] Follow up.   Why:  cpap and home oxygen Contact information: [place] [address] [phone]        Home, [facility] Follow up.   Specialty:  Home Health Services Why:  [facility], HHPT Contact information: [address] [place] [address] [phone]           Consultations:  PCCM  Procedures/Studies:  None   Dg Chest 1  View  Result Date: 12/02/2017 CLINICAL DATA:  77 year old male status post ultrasound-guided right thoracentesis this afternoon. EXAM: CHEST  1 VIEW COMPARISON:  Chest radiograph 11/24/2017. FINDINGS: Portable AP upright view at 1316 hours. Decreased blunting of the right lung base and mildly improved right lung base ventilation. No pneumothorax. Stable cardiomegaly and mediastinal contours. Stable diffuse increased interstitial opacity. No areas of worsening ventilation. IMPRESSION: Decreased right pleural effusion and no pneumothorax following right side thoracentesis. Electronically Signed   By: Genevie Ann M.D.   On: 12/02/2017 13:31   Dg Chest 2 View  Result Date: 12/04/2017 CLINICAL DATA:  Shortness of Breath EXAM: CHEST - 2 VIEW COMPARISON:  12/02/2017 FINDINGS: Cardiac shadow is enlarged. Aortic calcifications are again seen. Left lung remains clear. Small right pleural effusion is noted likely stable from the prior study.  No pneumothorax is noted. Some underlying right basilar atelectasis is seen. IMPRESSION: Stable right pleural effusion.  No new focal abnormality is noted. Electronically Signed   By: Inez Catalina M.D.   On: 12/04/2017 10:57   Dg Chest 2 View  Result Date: 11/24/2017 CLINICAL DATA:  CHF with feeling of fluid buildup and increased dyspnea. EXAM: CHEST - 2 VIEW COMPARISON:  [facility] dating back through 11/06/2016. FINDINGS: Chronic obscuration of the right costophrenic angle and portions of the right hemidiaphragm from presumed pulmonary consolidation and effusion are redemonstrated. Slight interval increase in size of the consolidation and/or fluid is seen at the right lung base with slight increase in interstitial edema. Stable cardiomegaly with moderate aortic atherosclerosis. Acute osseous abnormality. IMPRESSION: Slight interval increase in pulmonary consolidation and effusion at the right base with interval increase in interstitial edema, suggesting slight interval worsening of pulmonary edema. Cardiomegaly with aortic atherosclerosis is stable. Electronically Signed   By: Ashley Royalty M.D.   On: 11/24/2017 01:14   Ct Head Wo Contrast  Result Date: 12/02/2017 CLINICAL DATA:  77 year old male with altered mental status. Status post right thoracentesis today. EXAM: CT HEAD WITHOUT CONTRAST TECHNIQUE: Contiguous axial images were obtained from the base of the skull through the vertex without intravenous contrast. COMPARISON:  Head CT 10/27/2016. FINDINGS: Study is intermittently degraded by motion artifact despite repeated imaging attempts. Brain: Cerebral volume remains normal for age. No midline shift, ventriculomegaly, mass effect, evidence of mass lesion, intracranial hemorrhage or evidence of cortically based acute infarction. Gray-white matter differentiation is within normal limits throughout the brain. Vascular: Calcified atherosclerosis at the skull base. Skull: No acute osseous abnormality  identified. Sinuses/Orbits: Visualized paranasal sinuses and mastoids are stable and well pneumatized. Other: Postoperative changes to the left globe since 2018. No acute orbit or scalp s the oft tissue finding. IMPRESSION: Motion degraded exam. Grossly stable and normal for age non contrast CT appearance of the brain. Electronically Signed   By: Genevie Ann M.D.   On: 12/02/2017 13:50   Dg Abd 2 Views  Result Date: 12/04/2017 CLINICAL DATA:  Abdominal pain EXAM: ABDOMEN - 2 VIEW COMPARISON:  None. FINDINGS: Scattered large and small bowel gas is seen. No significant dilatation is noted. The overall appearance suggests an ileus. Correlation with the physical exam is recommended. No free air is noted. No abnormal mass or abnormal calcifications are seen. IMPRESSION: Generalized bowel gas likely reflecting an ileus. Clinical correlation is recommended. Electronically Signed   By: Inez Catalina M.D.   On: 12/04/2017 10:56   Ir Thoracentesis Asp Pleural Space W/img Guide  Result Date: 12/02/2017 INDICATION: Patient with right pleural effusion. Request is made  for diagnostic and therapeutic thoracentesis. EXAM: ULTRASOUND GUIDED DIAGNOSTIC AND THERAPEUTIC RIGHT THORACENTESIS MEDICATIONS: 10 mL 1% lidocaine COMPLICATIONS: None immediate. PROCEDURE: An ultrasound guided thoracentesis was thoroughly discussed with the patient and questions answered. The benefits, risks, alternatives and complications were also discussed. The patient understands and wishes to proceed with the procedure. Written consent was obtained. Ultrasound was performed to localize and mark an adequate pocket of fluid in the right chest. The area was then prepped and draped in the normal sterile fashion. 1% Lidocaine was used for local anesthesia. Under ultrasound guidance a 6 Fr Safe-T-Centesis catheter was introduced. Thoracentesis was performed. The catheter was removed and a dressing applied. FINDINGS: A total of approximately 1.2 Lambert of cloudy,  amber fluid was removed. Samples were sent to the laboratory as requested by the clinical team. IMPRESSION: Successful ultrasound guided diagnostic and therapeutic right thoracentesis yielding 1.2 Lambert of pleural fluid. Read by: Kacie Matthews PA-C Electronically Signed   By: M.  Shick M.D.   On: 12/02/2017 13:50     Subjective: - no chest pain, shortness of breath, no abdominal pain, nausea or vomiting.   Discharge Exam: Vitals:   12/06/17 0724 12/06/17 0830  BP:  124/68  Pulse:  61  Resp:    Temp:  (!) 97.5 F (36.4 C)  SpO2: 98% 99%    General: Pt is alert, awake, not in acute distress Cardiovascular: RRR, S1/S2 +, no rubs, no gallops Respiratory: CTA bilaterally, no wheezing, no rhonchi Abdominal: Soft, NT, ND, bowel sounds + Extremities: trace edema, no cyanosis    The results of significant diagnostics from this hospitalization (including imaging, microbiology, ancillary and laboratory) are listed below for reference.     Microbiology: Recent Results (from the past 240 hour(s))  Body fluid culture     Status: None (Preliminary result)   Collection Time: 12/02/17  1:23 PM  Result Value Ref Range Status   Specimen Description PLEURAL  Final   Special Requests NONE  Final   Gram Stain   Final    RARE WBC PRESENT, PREDOMINANTLY MONONUCLEAR NO ORGANISMS SEEN    Culture   Final    NO GROWTH 3 DAYS Performed at [facility], [address] [place], [place] [address]    Report Status PENDING  Incomplete     Labs: BNP (last 3 results) Recent Labs    07/09/17 1345 08/29/17 0306 11/24/17 0147  BNP 544* 438.9* 538.4*   Basic Metabolic Panel: Recent Labs  Lab 11/30/17 0303  12/02/17 0344 12/02/17 1520 12/03/17 0311 12/04/17 0245 12/05/17 0327  NA 140   < > 142 144 143 142 140  K 4.8   < > 4.1 3.8 4.0 4.1 3.4*  CL 97*   < > 99 99 100 98 97*  CO2 33*   < > 37* 38* 35* 36* 34*  GLUCOSE 89   < > 100* 96 95 109* 104*  BUN 49*   < > 37* 34* 34* 34* 35*    CREATININE 2.74*   < > 2.55* 2.61* 2.49* 2.63* 2.72*  CALCIUM 9.2   < > 9.6 9.7 9.5 9.4 9.7  MG 2.7*  --  3.1* 3.2*  --   --   --    < > = values in this interval not displayed.   Liver Function Tests: Recent Labs  Lab 11/30/17 0303  AST 42*  ALT 22  ALKPHOS 171*  BILITOT 1.5*  PROT 7.4  ALBUMIN 3.5   No results for   input(s): LIPASE, AMYLASE in the last 168 hours. Recent Labs  Lab 12/01/17 0309 12/02/17 0344  AMMONIA 68* 55*   CBC: Recent Labs  Lab 11/30/17 0303 12/01/17 0309 12/02/17 0344 12/03/17 0311 12/04/17 0245 12/05/17 0327  WBC 4.4 4.3 4.2 5.7 5.7 4.8  NEUTROABS 3.3  --   --   --   --   --   HGB 11.1* 10.9* 11.5* 11.6* 11.6* 11.8*  HCT 37.7* 37.7* 39.8 41.5 40.4 43.1  MCV 106.8* 107.4* 108.2* 109.2* 108.3* 108.6*  PLT 147* 147* 155 164 165 192   Cardiac Enzymes: No results for input(s): CKTOTAL, CKMB, CKMBINDEX, TROPONINI in the last 168 hours. BNP: Invalid input(s): POCBNP CBG: Recent Labs  Lab 12/03/17 1138 12/03/17 1653 12/03/17 2133 12/04/17 2122 12/05/17 2149  GLUCAP 102* 103* 106* 85 104*   D-Dimer No results for input(s): DDIMER in the last 72 hours. Hgb A1c No results for input(s): HGBA1C in the last 72 hours. Lipid Profile No results for input(s): CHOL, HDL, LDLCALC, TRIG, CHOLHDL, LDLDIRECT in the last 72 hours. Thyroid function studies No results for input(s): TSH, T4TOTAL, T3FREE, THYROIDAB in the last 72 hours.  Invalid input(s): FREET3 Anemia work up No results for input(s): VITAMINB12, FOLATE, FERRITIN, TIBC, IRON, RETICCTPCT in the last 72 hours. Urinalysis    Component Value Date/Time   COLORURINE YELLOW 10/27/2016 0113   APPEARANCEUR CLEAR 10/27/2016 0113   LABSPEC 1.013 10/27/2016 0113   PHURINE 6.0 10/27/2016 0113   GLUCOSEU NEGATIVE 10/27/2016 0113   HGBUR NEGATIVE 10/27/2016 0113   BILIRUBINUR NEGATIVE 10/27/2016 0113   KETONESUR NEGATIVE 10/27/2016 0113   PROTEINUR 100 (A) 10/27/2016 0113   UROBILINOGEN 0.2  06/17/2012 0622   NITRITE NEGATIVE 10/27/2016 0113   LEUKOCYTESUR NEGATIVE 10/27/2016 0113   Sepsis Labs Invalid input(s): PROCALCITONIN,  WBC,  LACTICIDVEN   Time coordinating discharge: 35 minutes  SIGNED:   , MD  [facility] 12/06/2017, 3:02 PM Pager [phone]  If 7PM-7AM, please contact night-coverage [email or web address] Password [email or web address]  

## 2017-12-06 NOTE — Progress Notes (Addendum)
Placed pt on CPAP auto settings with a max pressure of 18 and min pressure of 6 and 4L bled in. Added H2O to water chamber and pt tolerating well. Will cont to monitor.

## 2017-12-08 ENCOUNTER — Other Ambulatory Visit: Payer: Self-pay | Admitting: Internal Medicine

## 2017-12-08 DIAGNOSIS — J441 Chronic obstructive pulmonary disease with (acute) exacerbation: Secondary | ICD-10-CM | POA: Diagnosis not present

## 2017-12-08 DIAGNOSIS — E1122 Type 2 diabetes mellitus with diabetic chronic kidney disease: Secondary | ICD-10-CM

## 2017-12-08 DIAGNOSIS — N184 Chronic kidney disease, stage 4 (severe): Secondary | ICD-10-CM | POA: Diagnosis not present

## 2017-12-08 DIAGNOSIS — E11622 Type 2 diabetes mellitus with other skin ulcer: Secondary | ICD-10-CM | POA: Diagnosis not present

## 2017-12-08 DIAGNOSIS — I110 Hypertensive heart disease with heart failure: Secondary | ICD-10-CM | POA: Diagnosis not present

## 2017-12-08 DIAGNOSIS — J9612 Chronic respiratory failure with hypercapnia: Secondary | ICD-10-CM

## 2017-12-08 DIAGNOSIS — L97311 Non-pressure chronic ulcer of right ankle limited to breakdown of skin: Secondary | ICD-10-CM

## 2017-12-08 DIAGNOSIS — J9611 Chronic respiratory failure with hypoxia: Secondary | ICD-10-CM

## 2017-12-08 DIAGNOSIS — I5033 Acute on chronic diastolic (congestive) heart failure: Secondary | ICD-10-CM | POA: Diagnosis not present

## 2017-12-09 ENCOUNTER — Ambulatory Visit (INDEPENDENT_AMBULATORY_CARE_PROVIDER_SITE_OTHER): Payer: Medicare HMO | Admitting: Pharmacist

## 2017-12-09 ENCOUNTER — Telehealth: Payer: Self-pay

## 2017-12-09 DIAGNOSIS — Z7901 Long term (current) use of anticoagulants: Secondary | ICD-10-CM

## 2017-12-09 DIAGNOSIS — G4733 Obstructive sleep apnea (adult) (pediatric): Secondary | ICD-10-CM | POA: Diagnosis not present

## 2017-12-09 DIAGNOSIS — I4891 Unspecified atrial fibrillation: Secondary | ICD-10-CM | POA: Diagnosis not present

## 2017-12-09 LAB — POCT INR: INR: 2.9 (ref 2.0–3.0)

## 2017-12-09 NOTE — Patient Instructions (Signed)
Continue take 1/2 tablet daily except for 1 tablet everry Wednesday and Friday.  Repeat INR in 1 week with [facility] at [phone] if new medication started*

## 2017-12-09 NOTE — Telephone Encounter (Signed)
Transition Care Management Follow-up Telephone Call  Date of discharge and from where: [facility] on 12/06/2017  How have you been since you were released from the hospital? Breathing okay and has had no more sputum   Any questions or concerns? Seems to be going to the bathroom frequently- it is not urgent and no diarrhea   Items Reviewed:  Did the pt receive and understand the discharge instructions provided? Yes   Medications obtained and verified? Yes   Any new allergies since your discharge? Yes   Dietary orders reviewed? Yes  Do you have support at home? Yes , wife lives with him  Other (ie: DME, Home Health, etc) Oxygen  Functional Questionnaire: (I = Independent and D = Dependent) ADL's: I  Bathing/Dressing- I   Meal Prep- I w/ assist  Eating- I  Maintaining continence- I  Transferring/Ambulation- I  Managing Meds- I w/ assist   Follow up appointments reviewed:    PCP Hospital f/u appt confirmed? Yes  Scheduled to see Sherrie Mustache NP on 12/13/17 @ 11:15 am.  [facility] f/u appt confirmed? N/A  Are transportation arrangements needed? No  If their condition worsens, is the pt aware to call  their PCP or go to the ED? Yes  Was the patient provided with contact information for the PCP's office or ED? Yes  Was the pt encouraged to call back with questions or concerns? Yes

## 2017-12-11 ENCOUNTER — Other Ambulatory Visit: Payer: Self-pay

## 2017-12-11 NOTE — Patient Outreach (Signed)
[facility]) Care Management  12/11/2017  Omar Lambert September 27, 1940 [id number]   Referral received 12/06/17 from hospital liaison. Primary care listed as completing transition of care.  77 year old hospitalized 11/24/17-12/06/17 with acute on chronic heart failure; COPD. History of CKD, DM, COPD, Pulmonary HTN, OSA, atrial fibrillation.  RNCM called to follow up. No answer. HIPPA compliant message left.  Plan: Send outreach letter and follow up call within 3-4 business days if no return call.  Thea Silversmith, RN, MSN, [place] Coordinator Cell: [phone]

## 2017-12-13 ENCOUNTER — Ambulatory Visit (INDEPENDENT_AMBULATORY_CARE_PROVIDER_SITE_OTHER): Payer: Medicare HMO | Admitting: Nurse Practitioner

## 2017-12-13 ENCOUNTER — Encounter: Payer: Self-pay | Admitting: Nurse Practitioner

## 2017-12-13 VITALS — BP 128/66 | HR 63 | Temp 98.0°F | Ht 67.0 in | Wt 226.0 lb

## 2017-12-13 DIAGNOSIS — J449 Chronic obstructive pulmonary disease, unspecified: Secondary | ICD-10-CM | POA: Diagnosis not present

## 2017-12-13 DIAGNOSIS — N1832 Chronic kidney disease, stage 3b: Secondary | ICD-10-CM

## 2017-12-13 DIAGNOSIS — N183 Chronic kidney disease, stage 3 (moderate): Secondary | ICD-10-CM | POA: Diagnosis not present

## 2017-12-13 DIAGNOSIS — I4819 Other persistent atrial fibrillation: Secondary | ICD-10-CM | POA: Diagnosis not present

## 2017-12-13 DIAGNOSIS — I5032 Chronic diastolic (congestive) heart failure: Secondary | ICD-10-CM

## 2017-12-13 DIAGNOSIS — K219 Gastro-esophageal reflux disease without esophagitis: Secondary | ICD-10-CM

## 2017-12-13 DIAGNOSIS — J9611 Chronic respiratory failure with hypoxia: Secondary | ICD-10-CM | POA: Diagnosis not present

## 2017-12-13 DIAGNOSIS — J9612 Chronic respiratory failure with hypercapnia: Secondary | ICD-10-CM

## 2017-12-13 MED ORDER — PANTOPRAZOLE SODIUM 40 MG PO TBEC: 40.0000 mg | DELAYED_RELEASE_TABLET | Freq: Every day | ORAL | 1 refills | Status: DC

## 2017-12-13 NOTE — Patient Instructions (Signed)
Make sure to follow up with nephrologist (kidney doctor)   Follow up with Dr Mariea Clonts in 2 months for routine follow up

## 2017-12-13 NOTE — Progress Notes (Signed)
Careteam: Patient Care Team: Gayland Curry, DO as PCP - General (Geriatric Medicine) Troy Sine, MD as PCP - Cardiology (Cardiology) Pyrtle, Lajuan Lines, MD as Consulting Physician (Gastroenterology) Tanda Rockers, MD as Consulting Physician (Pulmonary Disease) Clent Jacks, MD as Consulting Physician (Ophthalmology) Gayland Curry, DO (Geriatric Medicine) Dory Horn, MD as Rounding Team (Internal Medicine) Luretha Rued, RN as [place] Management  Advanced Directive information Does Patient Have a Medical Advance Directive?: Yes, Type of Advance Directive: Out of facility DNR (pink MOST or yellow form), Pre-existing out of facility DNR order (yellow form or pink MOST form): Yellow form placed in chart (order not valid for inpatient use)  No Known Allergies  Chief Complaint  Patient presents with  . Transitions Of Care    Pt was recently admitted to [facility] 10/20 to 11/1.      HPI: Patient is a 77 y.o. male seen in the office today for hospital follow up.  Pt with hx of hypothyroidism, hypertension, hyperlipidemia, dm2, ckd stage3, Pafib, CHF (diastolic), Copd apparently c/o dyspnea. Pt states has been going on for about 2 weeks but worse and went  to ED for evaluation. Was diagnosised with acute on chronic CHF with COPD exacerbation.  Concern for pneumonia noted. Wife reports they just picked up a new prescription from the pharmacy yesterday. Did not know what they picked up. Breathing has improved, no shortness of breath  afib- rate controlled [name] monitors INR, taking coumadin 5 mg tablets takes 1 on Monday then 1/2 on T,W,Th and then 1 on Friday and 1/2 on Saturday and Sunday. Had INR checked on Monday after hospitalization.   AKI- basline renal function prior to being discharged.   Has home health scheduled, reports he is weak but able to get around.   Still has edema in legs. Home health nurse supposed to help wrap his legs but  they have not seen them yet. Taking demadex twice daily per hospital discharge and epic however nephrologist filling this Rx and he is taking demedex 20 mg 2 tablets in the morning and 1 at noon.   Does not feel like he needs o2, uses occasionally.   Review of Systems:  Review of Systems  Constitutional: Negative for chills, fever and malaise/fatigue.  HENT: Negative for congestion.   Eyes: Negative for blurred vision.  Respiratory: Positive for cough and sputum production. Negative for shortness of breath.        White sputum  Cardiovascular: Positive for leg swelling (improved). Negative for chest pain and palpitations.  Gastrointestinal: Negative for abdominal pain.  Genitourinary: Negative for dysuria.  Musculoskeletal: Negative for falls and joint pain.  Neurological: Positive for tremors. Negative for dizziness.  Endo/Heme/Allergies: Does not bruise/bleed easily.  Psychiatric/Behavioral: Negative for memory loss. The patient is not nervous/anxious and does not have insomnia.     Past Medical History:  Diagnosis Date  . Acute kidney injury superimposed on chronic kidney disease ([place]) 11/2016  . Acute respiratory failure ([place]) 11/2016  . Allergy   . Asthma   . Atrial fibrillation ([place])   . 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 Benign essential hypertension   . CHF (congestive heart failure) ([place])   . CKD (chronic kidney disease), stage III ([place])   . COPD (chronic obstructive pulmonary disease) ([place])   . Diabetes ([place])   . Diabetes mellitus without complication ([place])   . Dyspnea   . Gastritis   .  GERD (gastroesophageal reflux disease)   . Gout attack 09/2012  . Hypertension   . Sinusitis, maxillary, chronic   . Thyroid disease   . Type II or unspecified type diabetes mellitus without mention of complication, not stated as uncontrolled    Past Surgical History:  Procedure Laterality Date  . COLONOSCOPY  2003  . EYE SURGERY Left 05/06/2017   Cataract  removed  . EYE SURGERY Right 02/05/2013   Cataract removed  . HERNIA REPAIR  1980  . IR THORACENTESIS ASP PLEURAL SPACE W/IMG GUIDE  12/02/2017   Social History:   reports that he quit smoking about 20 years ago. His smoking use included cigarettes. He has a 20.00 pack-year smoking history. He has never used smokeless tobacco. He reports that he does not drink alcohol or use drugs.  Family History  Problem Relation Age of Onset  . Stroke Mother   . Diabetes Mother   . Cancer Sister   . Diabetes Sister   . COPD Sister   . COPD Sister   . Diabetes Father     Medications: Patient's Medications  New Prescriptions   No medications on file  Previous Medications   ACETAMINOPHEN (TYLENOL) 325 MG TABLET    Take 650 mg by mouth every 6 (six) hours as needed for mild pain.   ALBUTEROL (PROVENTIL) (2.5 MG/3ML) 0.083% NEBULIZER SOLUTION    INHALE CONTENTS OF 1 VIAL IN NEBULIZER EVERY 4 HOURS AS NEEDED FOR WHEEZING AND ASTHMA   ALLOPURINOL (ZYLOPRIM) 300 MG TABLET    TAKE 1 TABLET BY MOUTH EVERY DAY   CALCITRIOL (ROCALTROL) 0.25 MCG CAPSULE    Take 0.25 mcg by mouth daily. Alternating with 2.   FLUTICASONE (FLONASE) 50 MCG/ACT NASAL SPRAY    SPRAY 2 SPRAYS INTO EACH NOSTRIL EVERY DAY   ISOSORBIDE MONONITRATE (IMDUR) 30 MG 24 HR TABLET    Take 1 tablet (30 mg total) by mouth daily.   KLOR-CON M20 20 MEQ TABLET    TAKE 1 TABLET BY MOUTH EVERY DAY   LEVOTHYROXINE (SYNTHROID, LEVOTHROID) 175 MCG TABLET    TAKE 1 TABLET EVERY DAY BEFORE BREAKFAST   MULTIPLE VITAMINS-MINERALS (CENTRUM SILVER ADULT 50+ PO)    Take 1 tablet by mouth daily.   ROSUVASTATIN (CRESTOR) 20 MG TABLET    Take 1 tablet (20 mg total) by mouth daily.   TORSEMIDE (DEMADEX) 20 MG TABLET    Take 2 tablets by mouth every morning and 1 tablet at noon.   UMECLIDINIUM-VILANTEROL (ANORO ELLIPTA) 62.5-25 MCG/INH AEPB    Inhale 1 puff into the lungs daily.   UNABLE TO FIND    CPAP  Unknown DME- prescribed by doc in [place]   WARFARIN  (COUMADIN) 5 MG TABLET    TAKE 1/2 TO 1 TABLET BY MOUTH DAILY AS DIRECTED BY COUMADIN CLINIC  Modified Medications   Modified Medication Previous Medication   PANTOPRAZOLE (PROTONIX) 40 MG TABLET pantoprazole (PROTONIX) 40 MG tablet      Take 1 tablet (40 mg total) by mouth daily.    TAKE 1 TABLET BY MOUTH EVERY DAY BEFORE SUPPER  Discontinued Medications   CALCITRIOL (ROCALTROL) 0.25 MCG CAPSULE    Take 0.25 mcg by mouth daily.    SUPREP BOWEL PREP KIT 17.5-3.13-1.6 GM/177ML SOLN    Take 1 kit by mouth as directed. For colonoscopy prep   TORSEMIDE (DEMADEX) 20 MG TABLET    TAKE 1 TABLET TWICE A DAY     Physical Exam:  Vitals:   12/13/17 1054  BP: 128/66  Pulse: 63  Temp: 98 F (36.7 C)  TempSrc: Oral  SpO2: 97%  Weight: 226 lb (102.5 kg)  Height: _0  (1.702 m)   Body mass index is 35.4 kg/m.  Physical Exam  Constitutional: He appears well-developed. No distress.  HENT:  Head: Normocephalic and atraumatic.  Right Ear: External ear normal.  Left Ear: External ear normal.  Nose: Nose normal.  Mouth/Throat: Oropharynx is clear and moist. No oropharyngeal exudate.  Eyes: Pupils are equal, round, and reactive to light. Conjunctivae and EOM are normal.  Neck: Normal range of motion. Neck supple. No JVD present. No tracheal deviation present. No thyromegaly present.  Cardiovascular:  irreg irreg  Pulmonary/Chest: Effort normal.  diminshed throughout    Abdominal: Soft. Bowel sounds are normal. He exhibits no distension. There is no tenderness.  Musculoskeletal: He exhibits edema (trace bilaterally).  Lymphadenopathy:    He has no cervical adenopathy.  Neurological: He is alert.  Resting tremor of chin and action tremor of bilateral hands, left greater than right  Skin: Skin is warm and dry.  Psychiatric: He has a normal mood and affect.    Labs reviewed: Basic Metabolic Panel: Recent Labs    02/18/17 0944  11/29/17 1425 11/30/17 0303  12/02/17 0344 12/02/17 1520  12/03/17 0311 12/04/17 0245 12/05/17 0327  NA 147*   < >  --  140   < > 142 144 143 142 140  K 4.1   < >  --  4.8   < > 4.1 3.8 4.0 4.1 3.4*  CL 103   < >  --  97*   < > 99 99 100 98 97*  CO2 26   < >  --  33*   < > 37* 38* 35* 36* 34*  GLUCOSE 72   < >  --  89   < > 100* 96 95 109* 104*  BUN 28*   < >  --  49*   < > 37* 34* 34* 34* 35*  CREATININE 2.34*   < >  --  2.74*   < > 2.55* 2.61* 2.49* 2.63* 2.72*  CALCIUM 8.9   < >  --  9.2   < > 9.6 9.7 9.5 9.4 9.7  MG  --    < >  --  2.7*  --  3.1* 3.2*  --   --   --   TSH 0.609  --  1.258  --   --   --   --   --   --   --    < > = values in this interval not displayed.   Liver Function Tests: Recent Labs    02/18/17 0944 11/29/17 0317 11/30/17 0303  AST 22 43* 42*  ALT _1 ALKPHOS 116 194* 171*  BILITOT 0.5 1.2 1.5*  PROT 7.2 7.8 7.4  ALBUMIN 4.0 3.6 3.5   No results for input(s): LIPASE, AMYLASE in the last 8760 hours. Recent Labs    11/29/17 1425 12/01/17 0309 12/02/17 0344  AMMONIA 92* 68* 55*   CBC: Recent Labs    02/18/17 0944 07/09/17 1345  11/30/17 0303  12/03/17 0311 12/04/17 0245 12/05/17 0327  WBC 3.6 10.2   < > 4.4   < > 5.7 5.7 4.8  NEUTROABS 2.1 8,996*  --  3.3  --   --   --   --   HGB 10.4* 11.2*   < > 11.1*   < > 11.6*  11.6* 11.8*  HCT 32.5* 35.6*   < > 37.7*   < > 41.5 40.4 43.1  MCV 96 93.9   < > 106.8*   < > 109.2* 108.3* 108.6*  PLT 219 184   < > 147*   < > 164 165 192   < > = values in this interval not displayed.   Lipid Panel: Recent Labs    02/18/17 0944 08/30/17 0556 08/31/17 0508  CHOL 214* 142 135  HDL 81 87 84  LDLCALC 124* 51 44  TRIG 46 21 34  CHOLHDL 2.6 1.6 1.6   TSH: Recent Labs    02/18/17 0944 11/29/17 1425  TSH 0.609 1.258   A1C: Lab Results  Component Value Date   HGBA1C 6.2 (H) 11/26/2017     Assessment/Plan 1. Chronic diastolic CHF (congestive heart failure), NYHA class 2 (HCC) Stable, euvolemic at this time. Edema to LE trace today and  signficantly has improved per wife.   2. COPD GOLD II Stable at this time, will continue current regimen.   3. Morbid obesity ([place]) Ongoing.   4. Persistent atrial fibrillation Rate controlled, continues to have coumadin managed at coumadin clinic.   5. Chronic kidney disease (CKD) stage G3b/A2, moderately decreased glomerular filtration rate (GFR) between 30-44 mL/min/1.73 square meter and albuminuria creatinine ratio between 30-299 mg/g ([place]) Ongoing follow up with nephrologist. Kidney function close to baseline on discharge.   6. Gastroesophageal reflux disease without esophagitis Stable on protonix 40 mg by mouth daily, otherwise reports symptoms if not taking.   7. Chronic respiratory failure with hypoxia and hypercapnia (HCC) Stable at this time, using O2 PRN at this time.   Next appt: 06/06/2018  [name]. [place], [facility] [phone]

## 2017-12-16 ENCOUNTER — Telehealth: Payer: Self-pay

## 2017-12-16 ENCOUNTER — Telehealth: Payer: Self-pay | Admitting: Internal Medicine

## 2017-12-16 ENCOUNTER — Ambulatory Visit (INDEPENDENT_AMBULATORY_CARE_PROVIDER_SITE_OTHER): Payer: Medicare HMO | Admitting: Pharmacist Clinician (PhC)/ Clinical Pharmacy Specialist

## 2017-12-16 ENCOUNTER — Other Ambulatory Visit: Payer: Self-pay

## 2017-12-16 DIAGNOSIS — Z7901 Long term (current) use of anticoagulants: Secondary | ICD-10-CM

## 2017-12-16 DIAGNOSIS — I5033 Acute on chronic diastolic (congestive) heart failure: Secondary | ICD-10-CM | POA: Diagnosis not present

## 2017-12-16 DIAGNOSIS — E11622 Type 2 diabetes mellitus with other skin ulcer: Secondary | ICD-10-CM | POA: Diagnosis not present

## 2017-12-16 DIAGNOSIS — L97311 Non-pressure chronic ulcer of right ankle limited to breakdown of skin: Secondary | ICD-10-CM | POA: Diagnosis not present

## 2017-12-16 DIAGNOSIS — I4819 Other persistent atrial fibrillation: Secondary | ICD-10-CM | POA: Diagnosis not present

## 2017-12-16 DIAGNOSIS — N184 Chronic kidney disease, stage 4 (severe): Secondary | ICD-10-CM | POA: Diagnosis not present

## 2017-12-16 DIAGNOSIS — G4733 Obstructive sleep apnea (adult) (pediatric): Secondary | ICD-10-CM | POA: Diagnosis not present

## 2017-12-16 DIAGNOSIS — I110 Hypertensive heart disease with heart failure: Secondary | ICD-10-CM | POA: Diagnosis not present

## 2017-12-16 DIAGNOSIS — E1122 Type 2 diabetes mellitus with diabetic chronic kidney disease: Secondary | ICD-10-CM | POA: Diagnosis not present

## 2017-12-16 DIAGNOSIS — J9612 Chronic respiratory failure with hypercapnia: Secondary | ICD-10-CM | POA: Diagnosis not present

## 2017-12-16 DIAGNOSIS — J9611 Chronic respiratory failure with hypoxia: Secondary | ICD-10-CM | POA: Diagnosis not present

## 2017-12-16 DIAGNOSIS — J441 Chronic obstructive pulmonary disease with (acute) exacerbation: Secondary | ICD-10-CM | POA: Diagnosis not present

## 2017-12-16 LAB — POCT INR: INR: 2 (ref 2.0–3.0)

## 2017-12-16 NOTE — Telephone Encounter (Signed)
Received a call from [place] with [facility] calling to report INR 2.0 PT 23.8.Tillie Rung wanted to be called back at # [phone].Message sent to coumadin clinic.

## 2017-12-16 NOTE — Telephone Encounter (Signed)
Patients wife states pt just got out of the hosp and does not think he can do procedure at this time. Patients wife would like to cancel his procedure at the hosp for 11.25.19 and is requesting a call back to possibly schedule in Dec. Pt last seen for ov on 9.24.19

## 2017-12-16 NOTE — Patient Outreach (Signed)
[facility]) Care Management  12/16/2017  Omar Lambert 02-14-1940 [id number]  Referral received 12/06/17 from hospital liaison. Primary care listed as completing transition of care.  77 year old hospitalized 11/24/17-12/06/17 with acute on chronic heart failure; COPD. History of CKD, DM, COPD, Pulmonary HTN, OSA, atrial fibrillation.  RNCM called and spoke with client. Client reports he is doing better. He reports he is active with home health-[facility]. [name] states he has had a follow up with primary care. He denies any problems at this time.  Plan: home visit scheduled.  Thea Silversmith, RN, MSN, [place] Coordinator Cell: [phone]

## 2017-12-16 NOTE — Telephone Encounter (Signed)
Dr Hilarie Fredrickson- Please see hospital discharge from 12/06/17 and advise on whether safe to reschedule for endoscopy procedure in December or January (depending on availability) prior to me calling patient back. Thanks

## 2017-12-16 NOTE — Telephone Encounter (Signed)
I have spoken to patient and his wife to advise that we have cancelled out the 12-30-17 procedures for now. Advised Dr Hilarie Fredrickson would like to see patient in the office again prior to rescheduling any procedures due to his recent hospitalization. They verbalize understanding and are okay with coming on 01/30/18 at 245 pm.

## 2017-12-16 NOTE — Telephone Encounter (Signed)
Office visit with me or APP in Dec is recommended and then we can decide about fitness for procedures as recommended for IDA Thanks JMP

## 2017-12-18 DIAGNOSIS — I110 Hypertensive heart disease with heart failure: Secondary | ICD-10-CM | POA: Diagnosis not present

## 2017-12-18 DIAGNOSIS — J9612 Chronic respiratory failure with hypercapnia: Secondary | ICD-10-CM | POA: Diagnosis not present

## 2017-12-18 DIAGNOSIS — N184 Chronic kidney disease, stage 4 (severe): Secondary | ICD-10-CM | POA: Diagnosis not present

## 2017-12-18 DIAGNOSIS — J441 Chronic obstructive pulmonary disease with (acute) exacerbation: Secondary | ICD-10-CM | POA: Diagnosis not present

## 2017-12-18 DIAGNOSIS — E11622 Type 2 diabetes mellitus with other skin ulcer: Secondary | ICD-10-CM | POA: Diagnosis not present

## 2017-12-18 DIAGNOSIS — E1122 Type 2 diabetes mellitus with diabetic chronic kidney disease: Secondary | ICD-10-CM | POA: Diagnosis not present

## 2017-12-18 DIAGNOSIS — L97311 Non-pressure chronic ulcer of right ankle limited to breakdown of skin: Secondary | ICD-10-CM | POA: Diagnosis not present

## 2017-12-18 DIAGNOSIS — I5033 Acute on chronic diastolic (congestive) heart failure: Secondary | ICD-10-CM | POA: Diagnosis not present

## 2017-12-18 DIAGNOSIS — J9611 Chronic respiratory failure with hypoxia: Secondary | ICD-10-CM | POA: Diagnosis not present

## 2017-12-19 ENCOUNTER — Encounter: Payer: Medicare HMO | Admitting: Internal Medicine

## 2017-12-19 DIAGNOSIS — N184 Chronic kidney disease, stage 4 (severe): Secondary | ICD-10-CM | POA: Diagnosis not present

## 2017-12-19 DIAGNOSIS — J9612 Chronic respiratory failure with hypercapnia: Secondary | ICD-10-CM | POA: Diagnosis not present

## 2017-12-19 DIAGNOSIS — I110 Hypertensive heart disease with heart failure: Secondary | ICD-10-CM | POA: Diagnosis not present

## 2017-12-19 DIAGNOSIS — I5033 Acute on chronic diastolic (congestive) heart failure: Secondary | ICD-10-CM | POA: Diagnosis not present

## 2017-12-19 DIAGNOSIS — J9611 Chronic respiratory failure with hypoxia: Secondary | ICD-10-CM | POA: Diagnosis not present

## 2017-12-19 DIAGNOSIS — L97311 Non-pressure chronic ulcer of right ankle limited to breakdown of skin: Secondary | ICD-10-CM | POA: Diagnosis not present

## 2017-12-19 DIAGNOSIS — J441 Chronic obstructive pulmonary disease with (acute) exacerbation: Secondary | ICD-10-CM | POA: Diagnosis not present

## 2017-12-19 DIAGNOSIS — E11622 Type 2 diabetes mellitus with other skin ulcer: Secondary | ICD-10-CM | POA: Diagnosis not present

## 2017-12-19 DIAGNOSIS — E1122 Type 2 diabetes mellitus with diabetic chronic kidney disease: Secondary | ICD-10-CM | POA: Diagnosis not present

## 2017-12-23 DIAGNOSIS — L97311 Non-pressure chronic ulcer of right ankle limited to breakdown of skin: Secondary | ICD-10-CM | POA: Diagnosis not present

## 2017-12-23 DIAGNOSIS — I5033 Acute on chronic diastolic (congestive) heart failure: Secondary | ICD-10-CM | POA: Diagnosis not present

## 2017-12-23 DIAGNOSIS — J9611 Chronic respiratory failure with hypoxia: Secondary | ICD-10-CM | POA: Diagnosis not present

## 2017-12-23 DIAGNOSIS — J9612 Chronic respiratory failure with hypercapnia: Secondary | ICD-10-CM | POA: Diagnosis not present

## 2017-12-23 DIAGNOSIS — N184 Chronic kidney disease, stage 4 (severe): Secondary | ICD-10-CM | POA: Diagnosis not present

## 2017-12-23 DIAGNOSIS — J441 Chronic obstructive pulmonary disease with (acute) exacerbation: Secondary | ICD-10-CM | POA: Diagnosis not present

## 2017-12-23 DIAGNOSIS — E1122 Type 2 diabetes mellitus with diabetic chronic kidney disease: Secondary | ICD-10-CM | POA: Diagnosis not present

## 2017-12-23 DIAGNOSIS — I110 Hypertensive heart disease with heart failure: Secondary | ICD-10-CM | POA: Diagnosis not present

## 2017-12-23 DIAGNOSIS — E11622 Type 2 diabetes mellitus with other skin ulcer: Secondary | ICD-10-CM | POA: Diagnosis not present

## 2017-12-24 ENCOUNTER — Telehealth: Payer: Self-pay

## 2017-12-24 ENCOUNTER — Other Ambulatory Visit: Payer: Self-pay

## 2017-12-24 NOTE — Telephone Encounter (Signed)
I appreciate the call.  I'd like for him to have a BMP for hypokalemia and CKD when [facility] comes out on Thursday.  We will determine from there if he should still be on potassium and how much.  This classically happens with Omar Lambert.  His adherence has been poor for a long time.

## 2017-12-24 NOTE — Patient Outreach (Signed)
[facility]) Care Management   12/24/2017  DANNIEL TONES Jan 08, 1941 [id number]  BUDD FREIERMUTH is an 77 y.o. male  Subjective: "I am feeling pretty good".  Objective:  BP 118/70   Pulse 76   Resp 20   Ht 1.753 m (5\' 9" )   Wt 220 lb (99.8 kg) Comment: patient reported.  SpO2 91%   BMI 32.49 kg/m   Review of Systems  Respiratory:       Lung sounds clear.  Cardiovascular:       Heart S1S2 slightly irregular. Wraps to bilateral lower extremity.    Physical Exam skin warm dry, color within normal limits.  Encounter Medications:   Outpatient Encounter Medications as of 12/24/2017  Medication Sig  . acetaminophen (TYLENOL) 325 MG tablet Take 650 mg by mouth every 6 (six) hours as needed for mild pain.  Marland Kitchen albuterol (PROVENTIL) (2.5 MG/3ML) 0.083% nebulizer solution INHALE CONTENTS OF 1 VIAL IN NEBULIZER EVERY 4 HOURS AS NEEDED FOR WHEEZING AND ASTHMA  . allopurinol (ZYLOPRIM) 300 MG tablet TAKE 1 TABLET BY MOUTH EVERY DAY  . calcitRIOL (ROCALTROL) 0.25 MCG capsule Take 0.25 mcg by mouth daily. Alternating with 2.  . fluticasone (FLONASE) 50 MCG/ACT nasal spray SPRAY 2 SPRAYS INTO EACH NOSTRIL EVERY DAY  . isosorbide mononitrate (IMDUR) 30 MG 24 hr tablet Take 1 tablet (30 mg total) by mouth daily.  Marland Kitchen levothyroxine (SYNTHROID, LEVOTHROID) 175 MCG tablet TAKE 1 TABLET EVERY DAY BEFORE BREAKFAST  . Multiple Vitamins-Minerals (CENTRUM SILVER ADULT 50+ PO) Take 1 tablet by mouth daily.  . pantoprazole (PROTONIX) 40 MG tablet Take 1 tablet (40 mg total) by mouth daily.  . pravastatin (PRAVACHOL) 20 MG tablet Take 20 mg by mouth daily.  Marland Kitchen torsemide (DEMADEX) 20 MG tablet Take 2 tablets by mouth every morning and 1 tablet at noon.  Marland Kitchen UNABLE TO FIND CPAP  Unknown DME- prescribed by doc in [place]  . warfarin (COUMADIN) 5 MG tablet TAKE 1/2 TO 1 TABLET BY MOUTH DAILY AS DIRECTED BY COUMADIN CLINIC  . KLOR-CON M20 20 MEQ tablet TAKE 1 TABLET BY MOUTH EVERY DAY (Patient not  taking: No sig reported)  . rosuvastatin (CRESTOR) 20 MG tablet Take 1 tablet (20 mg total) by mouth daily. (Patient not taking: Reported on 12/24/2017)  . umeclidinium-vilanterol (ANORO ELLIPTA) 62.5-25 MCG/INH AEPB Inhale 1 puff into the lungs daily. (Patient not taking: Reported on 12/24/2017)   No facility-administered encounter medications on file as of 12/24/2017.     Functional Status:   In your present state of health, do you have any difficulty performing the following activities: 12/24/2017 11/24/2017  Hearing? N N  Vision? Y N  Difficulty concentrating or making decisions? Tempie Donning  Walking or climbing stairs? Y Y  Dressing or bathing? N N  Doing errands, shopping? Y N  Preparing Food and eating ? - -  Using the Toilet? - -  In the past six months, have you accidently leaked urine? - -  Do you have problems with loss of bowel control? - -  Managing your Medications? - -  Managing your Finances? - -  Housekeeping or managing your Housekeeping? - -  Some recent data might be hidden    Fall/Depression Screening:    Fall Risk  12/24/2017 12/13/2017 07/29/2017  Falls in the past year? 0 0 No  Risk for fall due to : - - -   PHQ 2/9 Scores 12/24/2017 07/29/2017 05/29/2017 03/07/2017 07/20/2016 05/17/2016 03/01/2016  PHQ -  2 Score 1 0 0 0 0 0 0    Assessment: 77 year old hospitalized 11/24/17-12/06/17 with acute on chronic heart failure; COPD. History of CKD, DM, COPD, Pulmonary HTN, OSA, atrial fibrillation.  Home visit completed. Present during visit is client's wife. Ms. Spelman states client is doing  A lot better. She states that client does not need personal care services because he is doing so much better. Client is active with home health nursing-[facility](Monday and Thursday visits). Mr. Palmatier reports his legs are wrapped by the home health nurse.   Medications reviewed-client manages his own medications.  1) He has a bottle of potasium that was prescribed by Dr. Jimmy Footman  October, but client states he did not take it because he did not know what it was.  2) Client does not have Anora. He reports it is too expensive. Mrs. Steers states she will ask his pulmonologist if he has any samples.  RNCM called primary care to inform; discussed possibility for home health nurse to draw blood work when they make home visit on Thursday.   Plan: follow up next month, update primary care, pharmacy referral regarding medication assistance.  [facility] CM Care Plan Problem One     Most Recent Value  Care Plan Problem One  at risk for readmission as evidence by client's recent hospitalization  Role Documenting the Problem One  Care Management [place] for Problem One  Active  [facility] Long Term Goal   client will not be readmitted within the next 31 days.  [facility] Long Term Goal Start Date  12/16/17  Interventions for Problem One Long Term Goal  RNCM provided [facility] calendar/organizer, encouraged to call RNCM 24 hour nurse advice line as needed, encouraged client/wife to attempt to get sample Anoro from pulmonologist if possible, referral for [facility] pharmacy assistance.  [facility] CM Short Term Goal #1   client will attend follow up visits as scheduled within the next 30 days.  [facility] CM Short Term Goal #1 Start Date  12/16/17  Interventions for Short Term Goal #1  follow up appointments reviewed, encouraged client/wife to schedule follow up appointment with cardiologist as client has had recent admission due to heart failure.  [facility] CM Short Term Goal #2   client/wife will call to schedule appointment with cardiologist within the next 30 days.  [facility] CM Short Term Goal #2 Start Date  12/24/17  Interventions for Short Term Goal #2  RNCM discussed with client/wife the importance of following up with cardiologist    [facility] CM Care Plan Problem Two     Most Recent Value  Care Plan Problem Two  knowledge deficit regarding heart failure management as evidence by client's statements/questions   Role  Documenting the Problem Two  Care Management Coordinator  Care Plan for Problem Two  Active  Interventions for Problem Two Long Term Goal   RNCM discussed the importance of daily weights and recording weights, [facility] calender/orgaizer provided and explained how to use,  [facility] Long Term Goal  client will verbalize at least 3 HF management strategies within the next 45-90 days.  [facility] Long Term Goal Start Date  12/24/17  [facility] CM Short Term Goal #1   client will weight and record weights at least 5 days/week within the next 30 days.  [facility] CM Short Term Goal #1 Start Date  12/25/17  Interventions for Short Term Goal #2   discussed the role daily weights has in HF management with client and his wife.  Thea Silversmith, RN, MSN, [place] Coordinator Cell: [phone]

## 2017-12-24 NOTE — Telephone Encounter (Signed)
Omar Lambert, [facility] Case Manager, called to let Dr. Mariea Clonts know that Omar Lambert has not been taking the potassium 20 meg that was prescribed on 12/02/17 by Dr. Urbano Heir. The instructions say that pt should take 1 tablet BID for 5 days and then take 1 tablet daily. She would like to know if Omar Lambert should start taking this medication and if so with what instructions. [facility] will be out for a visit this Thursday and Sherron Monday would like to know if another potassium level should be drawn. Last level was 3.4 on 12/05/17.

## 2017-12-26 ENCOUNTER — Ambulatory Visit: Payer: Self-pay | Admitting: Pharmacist

## 2017-12-26 ENCOUNTER — Other Ambulatory Visit: Payer: Self-pay | Admitting: Pharmacist

## 2017-12-26 DIAGNOSIS — E876 Hypokalemia: Secondary | ICD-10-CM | POA: Diagnosis not present

## 2017-12-26 DIAGNOSIS — J9611 Chronic respiratory failure with hypoxia: Secondary | ICD-10-CM | POA: Diagnosis not present

## 2017-12-26 DIAGNOSIS — N189 Chronic kidney disease, unspecified: Secondary | ICD-10-CM | POA: Diagnosis not present

## 2017-12-26 DIAGNOSIS — J441 Chronic obstructive pulmonary disease with (acute) exacerbation: Secondary | ICD-10-CM | POA: Diagnosis not present

## 2017-12-26 DIAGNOSIS — L97311 Non-pressure chronic ulcer of right ankle limited to breakdown of skin: Secondary | ICD-10-CM | POA: Diagnosis not present

## 2017-12-26 DIAGNOSIS — E1122 Type 2 diabetes mellitus with diabetic chronic kidney disease: Secondary | ICD-10-CM | POA: Diagnosis not present

## 2017-12-26 DIAGNOSIS — N184 Chronic kidney disease, stage 4 (severe): Secondary | ICD-10-CM | POA: Diagnosis not present

## 2017-12-26 DIAGNOSIS — J9612 Chronic respiratory failure with hypercapnia: Secondary | ICD-10-CM | POA: Diagnosis not present

## 2017-12-26 DIAGNOSIS — I110 Hypertensive heart disease with heart failure: Secondary | ICD-10-CM | POA: Diagnosis not present

## 2017-12-26 DIAGNOSIS — E11622 Type 2 diabetes mellitus with other skin ulcer: Secondary | ICD-10-CM | POA: Diagnosis not present

## 2017-12-26 DIAGNOSIS — I5033 Acute on chronic diastolic (congestive) heart failure: Secondary | ICD-10-CM | POA: Diagnosis not present

## 2017-12-26 LAB — BASIC METABOLIC PANEL
BUN: 24 — AB (ref 4–21)
Creatinine: 2 — AB (ref 0.6–1.3)
GLUCOSE: 146
POTASSIUM: 3 — AB (ref 3.4–5.3)
Sodium: 138 (ref 137–147)

## 2017-12-26 NOTE — Telephone Encounter (Signed)
I spoke with Melissa with [facility] and she stated that she would have the bmp drawn today. Order was faxed to [phone].

## 2017-12-27 ENCOUNTER — Other Ambulatory Visit: Payer: Self-pay | Admitting: Pharmacy Technician

## 2017-12-27 DIAGNOSIS — J9611 Chronic respiratory failure with hypoxia: Secondary | ICD-10-CM | POA: Diagnosis not present

## 2017-12-27 DIAGNOSIS — I5033 Acute on chronic diastolic (congestive) heart failure: Secondary | ICD-10-CM | POA: Diagnosis not present

## 2017-12-27 DIAGNOSIS — E11622 Type 2 diabetes mellitus with other skin ulcer: Secondary | ICD-10-CM | POA: Diagnosis not present

## 2017-12-27 DIAGNOSIS — J441 Chronic obstructive pulmonary disease with (acute) exacerbation: Secondary | ICD-10-CM | POA: Diagnosis not present

## 2017-12-27 DIAGNOSIS — J9612 Chronic respiratory failure with hypercapnia: Secondary | ICD-10-CM | POA: Diagnosis not present

## 2017-12-27 DIAGNOSIS — I110 Hypertensive heart disease with heart failure: Secondary | ICD-10-CM | POA: Diagnosis not present

## 2017-12-27 DIAGNOSIS — E1122 Type 2 diabetes mellitus with diabetic chronic kidney disease: Secondary | ICD-10-CM | POA: Diagnosis not present

## 2017-12-27 DIAGNOSIS — L97311 Non-pressure chronic ulcer of right ankle limited to breakdown of skin: Secondary | ICD-10-CM | POA: Diagnosis not present

## 2017-12-27 DIAGNOSIS — N184 Chronic kidney disease, stage 4 (severe): Secondary | ICD-10-CM | POA: Diagnosis not present

## 2017-12-27 NOTE — Patient Outreach (Signed)
[facility]) Care Management  12/27/2017  Omar Lambert 1940/10/11 [id number]   Received [place] patient assistance referral from [place] for Anoro Ellipta and Ventolin HFA. Prepared patient portion to be mailed and faxed provider portion to Dr. Melvyn Novas.  Will follow up with patient in 7-10 business days (due to holiday) to confirm application has been received.  Maud Deed Chana Bode [place] Certified Pharmacy Technician [place] Management Direct [phone]

## 2017-12-30 ENCOUNTER — Encounter (HOSPITAL_COMMUNITY): Payer: Self-pay

## 2017-12-30 ENCOUNTER — Ambulatory Visit (HOSPITAL_COMMUNITY): Admit: 2017-12-30 | Payer: Medicare HMO | Admitting: Internal Medicine

## 2017-12-30 ENCOUNTER — Encounter: Payer: Self-pay | Admitting: *Deleted

## 2017-12-30 ENCOUNTER — Other Ambulatory Visit: Payer: Self-pay | Admitting: Internal Medicine

## 2017-12-30 DIAGNOSIS — J9612 Chronic respiratory failure with hypercapnia: Secondary | ICD-10-CM | POA: Diagnosis not present

## 2017-12-30 DIAGNOSIS — N184 Chronic kidney disease, stage 4 (severe): Secondary | ICD-10-CM | POA: Diagnosis not present

## 2017-12-30 DIAGNOSIS — E1122 Type 2 diabetes mellitus with diabetic chronic kidney disease: Secondary | ICD-10-CM | POA: Diagnosis not present

## 2017-12-30 DIAGNOSIS — I5033 Acute on chronic diastolic (congestive) heart failure: Secondary | ICD-10-CM | POA: Diagnosis not present

## 2017-12-30 DIAGNOSIS — I110 Hypertensive heart disease with heart failure: Secondary | ICD-10-CM | POA: Diagnosis not present

## 2017-12-30 DIAGNOSIS — J441 Chronic obstructive pulmonary disease with (acute) exacerbation: Secondary | ICD-10-CM | POA: Diagnosis not present

## 2017-12-30 DIAGNOSIS — E11622 Type 2 diabetes mellitus with other skin ulcer: Secondary | ICD-10-CM | POA: Diagnosis not present

## 2017-12-30 DIAGNOSIS — L97311 Non-pressure chronic ulcer of right ankle limited to breakdown of skin: Secondary | ICD-10-CM | POA: Diagnosis not present

## 2017-12-30 DIAGNOSIS — J9611 Chronic respiratory failure with hypoxia: Secondary | ICD-10-CM | POA: Diagnosis not present

## 2017-12-30 SURGERY — ESOPHAGOGASTRODUODENOSCOPY (EGD) WITH PROPOFOL
Anesthesia: Monitor Anesthesia Care

## 2017-12-30 MED ORDER — UMECLIDINIUM-VILANTEROL 62.5-25 MCG/INH IN AEPB: 1.0000 | INHALATION_SPRAY | Freq: Every day | RESPIRATORY_TRACT | 11 refills | Status: DC

## 2017-12-30 NOTE — Patient Outreach (Signed)
[name]) Care Management  [place]   12/30/2017  Omar Lambert [birth year] [id number]  Reason for referral: medication assistance  Referral source: [facility] RNCM Referral medication(s): inhalers (Anora, Ventolin) Current insurance:[employer]  PMHx: Afib, HTN, COPD, GERD, T2DM, hypothyroidism   HPI: Successful outreach to 77 yo male with HIPAA identifiers verified.  Patient states he is doing well.  He does voice needing assistance with inhalers. He is currently taking Anora Ellipta and has albuterol rescue inhaler and nebs.  He reports 100% compliance with all medications and inhalers.  Medications reviewed and updated in [facility].  Patient denies SOB or fluid.   Objective: No Known Allergies  Medications Reviewed Today    Reviewed by Lavera Guise, [facility] (Pharmacist) on 12/30/17 at 80  Med List Status: <None>  Medication Order Taking? Sig Documenting Provider Last Dose Status Informant  acetaminophen (TYLENOL) 325 MG tablet [id number] Yes Take 650 mg by mouth every 6 (six) hours as needed for mild pain. [provider] Taking Active Self  albuterol (PROVENTIL) (2.5 MG/3ML) 0.083% nebulizer solution [id number] Yes INHALE CONTENTS OF 1 VIAL IN NEBULIZER EVERY 4 HOURS AS NEEDED FOR WHEEZING AND ASTHMA Mariea Clonts, [name] L, DO Taking Active Self  allopurinol (ZYLOPRIM) 300 MG tablet [id number] Yes TAKE 1 TABLET BY MOUTH EVERY DAY [name], [name] L, DO Taking Active Self  calcitRIOL (ROCALTROL) 0.25 MCG capsule [id number] Yes Take 0.25 mcg by mouth daily. Alternating with 2. [provider] Taking Active   fluticasone (FLONASE) 50 MCG/ACT nasal spray [id number] Yes SPRAY 2 SPRAYS INTO EACH NOSTRIL EVERY DAY [name], [name] L, DO Taking Active   isosorbide mononitrate (IMDUR) 30 MG 24 hr tablet [id number] Yes Take 1 tablet (30 mg total) by mouth daily. Troy Sine, MD Taking Active Self  KLOR-CON M20 20 MEQ tablet [id number] Yes TAKE 1 TABLET BY MOUTH EVERY DAY  [name], [name] L, DO Taking Active Self  levothyroxine (SYNTHROID, LEVOTHROID) 175 MCG tablet [id number] Yes TAKE 1 TABLET EVERY DAY BEFORE BREAKFAST [name], [name] L, DO Taking Active Self  Multiple Vitamins-Minerals (CENTRUM SILVER ADULT 50+ PO) [id number] Yes Take 1 tablet by mouth daily. [provider] Taking Active Self  Baruch Gouty [id number]  Please draw a STAT BMP on Thursday 12/26/17 due to hypokalemia and CKD. Please fax results to Dr. [name], DO at [phone]. [provider]  Active   pantoprazole (PROTONIX) 40 MG tablet [id number] Yes Take 1 tablet (40 mg total) by mouth daily. Lauree Chandler, NP Taking Active   rosuvastatin (CRESTOR) 20 MG tablet [id number] Yes Take 1 tablet (20 mg total) by mouth daily. Troy Sine, MD Taking Active Self  torsemide (DEMADEX) 20 MG tablet [id number] Yes Take 2 tablets by mouth every morning and 1 tablet at noon. [provider] Taking Active   umeclidinium-vilanterol (ANORO ELLIPTA) 62.5-25 MCG/INH AEPB [id number] Yes Inhale 1 puff into the lungs daily. Tanda Rockers, MD Taking Active   UNABLE TO FIND [id number] Yes CPAP  Unknown DME- prescribed by doc in [facility] [provider] Taking Active Self  warfarin (COUMADIN) 5 MG tablet [id number] Yes TAKE 1/2 TO 1 TABLET BY MOUTH DAILY AS DIRECTED BY COUMADIN CLINIC Troy Sine, MD Taking Active           Assessment:  Drugs sorted by system:  Neurologic/Psychologic:  Cardiovascular: torsemide/K+, warfarin, rosuvastatin, isosorbide  Pulmonary/Allergy: albuterol, Anoro ellipta, Flonase  Gastrointestinal: pantoprazole  Endocrine: levothyroxine, calcitriol  Vitamins/Minerals/Supplements: MVI  Miscellaneous: allopurinol  Medication Review Findings:  .  Patient stable on warfarin and INR has been within goal range of 2-3 in Oct-Nov 2019 (per [facility] records).  Patient denies S/Sx of bleeding   Medication Assistance Findings:  Extra Help:   '[]'  Already  receiving Full Extra Help  '[]'  Already receiving Partial Extra Help  '[]'  Eligible based on reported income and assets  '[x]'  Not Eligible based on reported income and assets  Patient Assistance Programs: 1) Anoro Ellipta and Ventolin made by [place] o Income requirement met: '[x]'  Yes '[]'  No '[]'  Unknown o Out-of-pocket prescription expenditure met:    '[x]'  Yes '[]'  No  '[]'  Unknown  '[]'  Not applicable Patient states he thinks he has met OOP for [place]   Additional medication assistance options reviewed with patient as warranted:  No other options identified  Plan: I will route patient assistance letter to [place] technician who will coordinate patient assistance program application process for medications listed above.  [facility] pharmacy technician will assist with obtaining all required documents from both patient and provider and submit application(s) once completed.     Regina Eck, PharmD, [place]  [phone]

## 2017-12-30 NOTE — Telephone Encounter (Signed)
Received labs from [facility].  Potassium remains low at 3.  Patient should start is potassium 52meq, but take two tablets today, tomorrow, and Wednesday, then one tablet daily thereafter.

## 2017-12-31 DIAGNOSIS — L97311 Non-pressure chronic ulcer of right ankle limited to breakdown of skin: Secondary | ICD-10-CM | POA: Diagnosis not present

## 2017-12-31 DIAGNOSIS — J441 Chronic obstructive pulmonary disease with (acute) exacerbation: Secondary | ICD-10-CM | POA: Diagnosis not present

## 2017-12-31 DIAGNOSIS — J9611 Chronic respiratory failure with hypoxia: Secondary | ICD-10-CM | POA: Diagnosis not present

## 2017-12-31 DIAGNOSIS — I110 Hypertensive heart disease with heart failure: Secondary | ICD-10-CM | POA: Diagnosis not present

## 2017-12-31 DIAGNOSIS — N184 Chronic kidney disease, stage 4 (severe): Secondary | ICD-10-CM | POA: Diagnosis not present

## 2017-12-31 DIAGNOSIS — I5033 Acute on chronic diastolic (congestive) heart failure: Secondary | ICD-10-CM | POA: Diagnosis not present

## 2017-12-31 DIAGNOSIS — J9612 Chronic respiratory failure with hypercapnia: Secondary | ICD-10-CM | POA: Diagnosis not present

## 2017-12-31 DIAGNOSIS — E11622 Type 2 diabetes mellitus with other skin ulcer: Secondary | ICD-10-CM | POA: Diagnosis not present

## 2017-12-31 DIAGNOSIS — E1122 Type 2 diabetes mellitus with diabetic chronic kidney disease: Secondary | ICD-10-CM | POA: Diagnosis not present

## 2017-12-31 NOTE — Telephone Encounter (Signed)
.  left message to have patient return my call.  

## 2018-01-03 DIAGNOSIS — J9611 Chronic respiratory failure with hypoxia: Secondary | ICD-10-CM | POA: Diagnosis not present

## 2018-01-03 DIAGNOSIS — I509 Heart failure, unspecified: Secondary | ICD-10-CM | POA: Diagnosis not present

## 2018-01-03 DIAGNOSIS — I129 Hypertensive chronic kidney disease with stage 1 through stage 4 chronic kidney disease, or unspecified chronic kidney disease: Secondary | ICD-10-CM | POA: Diagnosis not present

## 2018-01-03 DIAGNOSIS — I110 Hypertensive heart disease with heart failure: Secondary | ICD-10-CM | POA: Diagnosis not present

## 2018-01-03 DIAGNOSIS — N184 Chronic kidney disease, stage 4 (severe): Secondary | ICD-10-CM | POA: Diagnosis not present

## 2018-01-03 DIAGNOSIS — I4891 Unspecified atrial fibrillation: Secondary | ICD-10-CM | POA: Diagnosis not present

## 2018-01-03 DIAGNOSIS — N2581 Secondary hyperparathyroidism of renal origin: Secondary | ICD-10-CM | POA: Diagnosis not present

## 2018-01-03 DIAGNOSIS — J441 Chronic obstructive pulmonary disease with (acute) exacerbation: Secondary | ICD-10-CM | POA: Diagnosis not present

## 2018-01-03 DIAGNOSIS — J9612 Chronic respiratory failure with hypercapnia: Secondary | ICD-10-CM | POA: Diagnosis not present

## 2018-01-03 DIAGNOSIS — E1122 Type 2 diabetes mellitus with diabetic chronic kidney disease: Secondary | ICD-10-CM | POA: Diagnosis not present

## 2018-01-03 DIAGNOSIS — D631 Anemia in chronic kidney disease: Secondary | ICD-10-CM | POA: Diagnosis not present

## 2018-01-03 DIAGNOSIS — M109 Gout, unspecified: Secondary | ICD-10-CM | POA: Diagnosis not present

## 2018-01-03 DIAGNOSIS — L97311 Non-pressure chronic ulcer of right ankle limited to breakdown of skin: Secondary | ICD-10-CM | POA: Diagnosis not present

## 2018-01-03 DIAGNOSIS — I5033 Acute on chronic diastolic (congestive) heart failure: Secondary | ICD-10-CM | POA: Diagnosis not present

## 2018-01-03 DIAGNOSIS — N189 Chronic kidney disease, unspecified: Secondary | ICD-10-CM | POA: Diagnosis not present

## 2018-01-03 DIAGNOSIS — E785 Hyperlipidemia, unspecified: Secondary | ICD-10-CM | POA: Diagnosis not present

## 2018-01-03 DIAGNOSIS — E11622 Type 2 diabetes mellitus with other skin ulcer: Secondary | ICD-10-CM | POA: Diagnosis not present

## 2018-01-05 DIAGNOSIS — G4733 Obstructive sleep apnea (adult) (pediatric): Secondary | ICD-10-CM | POA: Diagnosis not present

## 2018-01-05 DIAGNOSIS — J449 Chronic obstructive pulmonary disease, unspecified: Secondary | ICD-10-CM | POA: Diagnosis not present

## 2018-01-06 NOTE — Telephone Encounter (Signed)
Spoke with patient and advised results   

## 2018-01-07 ENCOUNTER — Ambulatory Visit (INDEPENDENT_AMBULATORY_CARE_PROVIDER_SITE_OTHER): Payer: Medicare HMO

## 2018-01-07 ENCOUNTER — Other Ambulatory Visit: Payer: Self-pay | Admitting: Pharmacy Technician

## 2018-01-07 ENCOUNTER — Ambulatory Visit: Payer: Self-pay

## 2018-01-07 ENCOUNTER — Ambulatory Visit: Payer: Self-pay | Admitting: Pharmacy Technician

## 2018-01-07 DIAGNOSIS — J9611 Chronic respiratory failure with hypoxia: Secondary | ICD-10-CM | POA: Diagnosis not present

## 2018-01-07 DIAGNOSIS — L97311 Non-pressure chronic ulcer of right ankle limited to breakdown of skin: Secondary | ICD-10-CM | POA: Diagnosis not present

## 2018-01-07 DIAGNOSIS — Z7901 Long term (current) use of anticoagulants: Secondary | ICD-10-CM

## 2018-01-07 DIAGNOSIS — J441 Chronic obstructive pulmonary disease with (acute) exacerbation: Secondary | ICD-10-CM | POA: Diagnosis not present

## 2018-01-07 DIAGNOSIS — N184 Chronic kidney disease, stage 4 (severe): Secondary | ICD-10-CM | POA: Diagnosis not present

## 2018-01-07 DIAGNOSIS — I110 Hypertensive heart disease with heart failure: Secondary | ICD-10-CM | POA: Diagnosis not present

## 2018-01-07 DIAGNOSIS — E11622 Type 2 diabetes mellitus with other skin ulcer: Secondary | ICD-10-CM | POA: Diagnosis not present

## 2018-01-07 DIAGNOSIS — I5033 Acute on chronic diastolic (congestive) heart failure: Secondary | ICD-10-CM | POA: Diagnosis not present

## 2018-01-07 DIAGNOSIS — J9612 Chronic respiratory failure with hypercapnia: Secondary | ICD-10-CM | POA: Diagnosis not present

## 2018-01-07 DIAGNOSIS — E1122 Type 2 diabetes mellitus with diabetic chronic kidney disease: Secondary | ICD-10-CM | POA: Diagnosis not present

## 2018-01-07 LAB — POCT INR: INR: 2.4 (ref 2.0–3.0)

## 2018-01-07 NOTE — Progress Notes (Signed)
Scheduled overdue inr

## 2018-01-07 NOTE — Patient Outreach (Signed)
[facility]) Care Management  01/07/2018  Omar Lambert [birth year] [id number]    Unsuccessful call placed to patient regarding patient assistance application, HIPAA compliant voicemail left.  Will make 2nd call attempt in 2-3 business days if call has not been returned.  Maud Deed Chana Bode [place] Certified Pharmacy Technician [place] Management Direct Dial:[phone]

## 2018-01-09 ENCOUNTER — Other Ambulatory Visit: Payer: Self-pay | Admitting: Pharmacy Technician

## 2018-01-09 DIAGNOSIS — E1122 Type 2 diabetes mellitus with diabetic chronic kidney disease: Secondary | ICD-10-CM | POA: Diagnosis not present

## 2018-01-09 DIAGNOSIS — L97311 Non-pressure chronic ulcer of right ankle limited to breakdown of skin: Secondary | ICD-10-CM | POA: Diagnosis not present

## 2018-01-09 DIAGNOSIS — I5033 Acute on chronic diastolic (congestive) heart failure: Secondary | ICD-10-CM | POA: Diagnosis not present

## 2018-01-09 DIAGNOSIS — E11622 Type 2 diabetes mellitus with other skin ulcer: Secondary | ICD-10-CM | POA: Diagnosis not present

## 2018-01-09 DIAGNOSIS — J9611 Chronic respiratory failure with hypoxia: Secondary | ICD-10-CM | POA: Diagnosis not present

## 2018-01-09 DIAGNOSIS — J9612 Chronic respiratory failure with hypercapnia: Secondary | ICD-10-CM | POA: Diagnosis not present

## 2018-01-09 DIAGNOSIS — J441 Chronic obstructive pulmonary disease with (acute) exacerbation: Secondary | ICD-10-CM | POA: Diagnosis not present

## 2018-01-09 DIAGNOSIS — I110 Hypertensive heart disease with heart failure: Secondary | ICD-10-CM | POA: Diagnosis not present

## 2018-01-09 DIAGNOSIS — N184 Chronic kidney disease, stage 4 (severe): Secondary | ICD-10-CM | POA: Diagnosis not present

## 2018-01-09 NOTE — Patient Outreach (Signed)
[facility]) Care Management  01/09/2018  Omar Lambert [birth year] [id number]    Successful call placed to patient regarding patient assistance application, HIPAA identifiers verified. Mr. Kniss confirms that he has received application for Anoro and Ventolin. Reviewed with patient what documents would need to be provided in order for [place] to apply. Also informed patient that [place] will stop taking applications on 37/50/51. Patient states that he does want to apply still.  Will submit application once all documents have been returned.  Maud Deed Chana Bode [place] Certified Pharmacy Technician [place] Management Direct Dial:[phone]

## 2018-01-10 ENCOUNTER — Ambulatory Visit (INDEPENDENT_AMBULATORY_CARE_PROVIDER_SITE_OTHER): Payer: Medicare HMO | Admitting: Physician Assistant

## 2018-01-10 ENCOUNTER — Encounter: Payer: Self-pay | Admitting: Physician Assistant

## 2018-01-10 ENCOUNTER — Ambulatory Visit (INDEPENDENT_AMBULATORY_CARE_PROVIDER_SITE_OTHER): Payer: Medicare HMO | Admitting: Pharmacist Clinician (PhC)/ Clinical Pharmacy Specialist

## 2018-01-10 VITALS — BP 122/80 | HR 89 | Ht 69.0 in | Wt 233.0 lb

## 2018-01-10 DIAGNOSIS — E039 Hypothyroidism, unspecified: Secondary | ICD-10-CM

## 2018-01-10 DIAGNOSIS — G4733 Obstructive sleep apnea (adult) (pediatric): Secondary | ICD-10-CM

## 2018-01-10 DIAGNOSIS — I4891 Unspecified atrial fibrillation: Secondary | ICD-10-CM

## 2018-01-10 DIAGNOSIS — J449 Chronic obstructive pulmonary disease, unspecified: Secondary | ICD-10-CM

## 2018-01-10 DIAGNOSIS — I10 Essential (primary) hypertension: Secondary | ICD-10-CM | POA: Diagnosis not present

## 2018-01-10 DIAGNOSIS — I5033 Acute on chronic diastolic (congestive) heart failure: Secondary | ICD-10-CM | POA: Diagnosis not present

## 2018-01-10 DIAGNOSIS — I4819 Other persistent atrial fibrillation: Secondary | ICD-10-CM | POA: Diagnosis not present

## 2018-01-10 DIAGNOSIS — Z7901 Long term (current) use of anticoagulants: Secondary | ICD-10-CM | POA: Diagnosis not present

## 2018-01-10 DIAGNOSIS — N184 Chronic kidney disease, stage 4 (severe): Secondary | ICD-10-CM

## 2018-01-10 LAB — POCT INR: INR: 2.1 (ref 2.0–3.0)

## 2018-01-10 NOTE — Progress Notes (Signed)
Cardiology Office Note    Date:  01/10/2018   ID:  Omar Lambert, Omar Lambert 09/29/1940, MRN [id number]  PCP:  Omar Curry, DO  Cardiologist:   Dr. Claiborne Lambert  Primary nephrologist: Dr. Jimmy Lambert   Chief Complaint  Patient presents with  . Follow-up    seen for Dr. Claiborne Lambert.     History of Present Illness:  Omar Lambert is a 77 y.o. male with PMH of severe COPD, DM II, CKD, HTN, OSA, hypothyroidism and atrial flutter. He has a history of lower extremity venous insufficiency. Previousvenous Doppler did not show any evidence of thrombosis or thrombophlebitis, but he had bilateral deep venous insufficiency within the femoral and popliteal vein. Last echocardiogram obtained on 08/18/2015 showed EF 60-65%, moderate TR, PA peak pressure 40 mmHg. He was previously on Xarelto for atrial flutter, this has later transitioned to eliquis 5 mg twice a day for blood in the stool. His aspirin was discontinued around the same time. He was admitted to [facility] in March 2018 with worsening shortness breath, orthopnea and weight gain. He was treated with IV Lasix and diuresed 7 pounds. He was transitioned to torsemide 20 mg twice a day. He was also empirically treated for possible CPAP.  Outpatient Myoview obtained on 10/23/2016 showed EF 47%, low risk study with normal perfusion and mildly reduced LV function.  He had an episode of syncope in September after eye surgery.  On EMS arrival he was noted to be in atrial fibrillation with slow ventricular response.  He was not on any AV nodal blocking agent but was taking amiodarone.  Amiodarone was discontinued.  He was also noted to be volume overloaded requiring intubation as well.  He received IV diuresis.  He had acute on chronic renal insufficiency during the hospitalization and required temporary dialysis in the hospital.  His Eliquis was also switched to Coumadin due to his renal function.  Echocardiogram at the time showed EF 65 to 70%, moderate LVH without  regional wall motion abnormality, PA peak pressure at 38 mmHg, severe TR. a 30-day event monitor showed predominantly sinus rhythm with no recurrent atrial fibrillation and isolated PVCs.  His last office visit with Dr. Claiborne Lambert was on 02/18/2017, he was doing well at the time.  Since his last office visit, patient was admitted in July 2019 with increasing shortness of breath.  BNP was elevated to 438 at the time.  Troponin borderline elevated but stayed flat.  He was also noted to be in recurrent rate controlled atrial fibrillation.  Repeat echocardiogram obtained on 08/29/2017 showed EF 50 to 55%, PA peak pressure 47 mmHg.  Patient was readmitted in October with acute hypoxic and hypercapnic respiratory failure.  This was felt to be secondary to acute on chronic diastolic heart failure with suspected underlying pneumonia.  He was given IV antibiotics and also diuresed aggressively as well.  His baseline creatinine was around 2.3-2.5, his discharge creatinine was 2.7.  His discharge weight was 105.9 kg or 233 lbs. since discharge, he has been seen in the Coumadin clinic, last INR on 12/3 was 2.4.   Patient presents today for post hospital cardiology office for follow-up.  His breathing is at baseline, on first arrival, his O2 saturations at 83%.  However after being placed on oxygen, his O2 saturation went up to 93%.  I think it is important for him to obtain a portable oxygen concentrator as he will have very frequent office visits.  Since his discharge, he has  been seen by his nephrologist who increased his torsemide to 60 mg BID from 40mg  AM/20mg  PM.  He appears to be euvolemic at this point.  According to the patient, he is scheduled to have a chest x-ray and to go back to have lab work to follow-up on his renal function and electrolyte.  I will defer to his nephrologist to monitor renal function in this case.  Otherwise he denies any obvious chest pain, his breathing is stable.  I did not appreciate significant  crackles on physical exam nor do I see significant lower extremity edema.  I plan to bring the patient back in 4 weeks for closer visit.  Resting O2: 83% Pulse oximetry after 2L/min nasal cannula: 93%   Past Medical History:  Diagnosis Date  . Acute kidney injury superimposed on chronic kidney disease ([place]) 11/2016  . Acute respiratory failure ([place]) 11/2016  . Allergy   . Asthma   . Atrial fibrillation ([place])   . 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 Benign essential hypertension   . CHF (congestive heart failure) ([place])   . CKD (chronic kidney disease), stage III ([place])   . COPD (chronic obstructive pulmonary disease) ([place])   . Diabetes ([place])   . Diabetes mellitus without complication ([place])   . Dyspnea   . Gastritis   . GERD (gastroesophageal reflux disease)   . Gout attack 09/2012  . Hypertension   . Sinusitis, maxillary, chronic   . Thyroid disease   . Type II or unspecified type diabetes mellitus without mention of complication, not stated as uncontrolled     Past Surgical History:  Procedure Laterality Date  . COLONOSCOPY  2003  . EYE SURGERY Left 05/06/2017   Cataract removed  . EYE SURGERY Right 02/05/2013   Cataract removed  . HERNIA REPAIR  1980  . IR THORACENTESIS ASP PLEURAL SPACE W/IMG GUIDE  12/02/2017    Current Medications: Outpatient Medications Prior to Visit  Medication Sig Dispense Refill  . acetaminophen (TYLENOL) 325 MG tablet Take 650 mg by mouth every 6 (six) hours as needed for mild pain.    Marland Kitchen albuterol (PROVENTIL) (2.5 MG/3ML) 0.083% nebulizer solution INHALE CONTENTS OF 1 VIAL IN NEBULIZER EVERY 4 HOURS AS NEEDED FOR WHEEZING AND ASTHMA 75 mL 4  . allopurinol (ZYLOPRIM) 300 MG tablet TAKE 1 TABLET BY MOUTH EVERY DAY 30 tablet 6  . calcitRIOL (ROCALTROL) 0.25 MCG capsule Take 0.25 mcg by mouth daily. Alternating with 2.    . fluticasone (FLONASE) 50 MCG/ACT nasal spray SPRAY 2 SPRAYS INTO EACH NOSTRIL EVERY DAY 48 g 1  .  isosorbide mononitrate (IMDUR) 30 MG 24 hr tablet Take 1 tablet (30 mg total) by mouth daily. 30 tablet 3  . KLOR-CON M20 20 MEQ tablet TAKE 1 TABLET BY MOUTH EVERY DAY 30 tablet 1  . levothyroxine (SYNTHROID, LEVOTHROID) 175 MCG tablet TAKE 1 TABLET EVERY DAY BEFORE BREAKFAST 30 tablet 3  . Multiple Vitamins-Minerals (CENTRUM SILVER ADULT 50+ PO) Take 1 tablet by mouth daily.    . NON FORMULARY Please draw a STAT BMP on Thursday 12/26/17 due to hypokalemia and CKD. Please fax results to Dr. [name], DO at [phone].    . pantoprazole (PROTONIX) 40 MG tablet Take 1 tablet (40 mg total) by mouth daily. 90 tablet 1  . rosuvastatin (CRESTOR) 20 MG tablet Take 1 tablet (20 mg total) by mouth daily. 30 tablet 3  . torsemide (DEMADEX) 20 MG tablet Take 60 mg by  mouth 2 (two) times daily. AM and noon.    . umeclidinium-vilanterol (ANORO ELLIPTA) 62.5-25 MCG/INH AEPB Inhale 1 puff into the lungs daily. 30 each 11  . UNABLE TO FIND CPAP  Unknown DME- prescribed by doc in [place]    . warfarin (COUMADIN) 5 MG tablet TAKE 1/2 TO 1 TABLET BY MOUTH DAILY AS DIRECTED BY COUMADIN CLINIC 90 tablet 0  . torsemide (DEMADEX) 20 MG tablet Take 2 tablets by mouth every morning and 1 tablet at noon.     No facility-administered medications prior to visit.      Allergies:   Patient has no known allergies.   Social History   Socioeconomic History  . Marital status: Married    Spouse name: Not on file  . Number of children: 3  . Years of education: Not on file  . Highest education level: Not on file  Occupational History  . Occupation: Retired  Scientific laboratory technician  . Financial resource strain: Not hard at all  . Food insecurity:    Worry: Never true    Inability: Never true  . Transportation needs:    Medical: No    Non-medical: No  Tobacco Use  . Smoking status: Former Smoker    Packs/day: 1.00    Years: 20.00    Pack years: 20.00    Types: Cigarettes    Last attempt to quit: 02/05/1997    Years since  quitting: 20.9  . Smokeless tobacco: Never Used  Substance and Sexual Activity  . Alcohol use: No  . Drug use: No  . Sexual activity: Not Currently  Lifestyle  . Physical activity:    Days per week: 0 days    Minutes per session: 0 min  . Stress: Not at all  Relationships  . Social connections:    Talks on phone: Once a week    Gets together: Twice a week    Attends religious service: More than 4 times per year    Active member of club or organization: No    Attends meetings of clubs or organizations: Never    Relationship status: Married  Other Topics Concern  . Not on file  Social History Narrative   ** Merged History Encounter **       Lives in 1 story apt with his wife, Orvile Corona.  Sometimes exercises.  Drinks coffee.       Family History:  The patient's family history includes COPD in his sister and sister; Cancer in his sister; Diabetes in his father, mother, and sister; Stroke in his mother.   ROS:   Please see the history of present illness.    ROS All other systems reviewed and are negative.   PHYSICAL EXAM:   VS:  BP 122/80   Pulse 89   Ht 5\' 9"  (1.753 m)   Wt 233 lb (105.7 kg)   SpO2 (!) 83%   BMI 34.41 kg/m    GEN: Well nourished, well developed, in no acute distress  HEENT: normal  Neck: no JVD, carotid bruits, or masses Cardiac: Irregularly irregular; no murmurs, rubs, or gallops,no edema  Respiratory: Diminished breath sound throughout the lung. GI: soft, nontender, nondistended, + BS MS: no deformity or atrophy  Skin: warm and dry, no rash Neuro:  Alert and Oriented x 3, Strength and sensation are intact Psych: euthymic mood, full affect  Wt Readings from Last 3 Encounters:  01/10/18 233 lb (105.7 kg)  12/24/17 220 lb (99.8 kg)  12/13/17 226 lb (102.5  kg)      Studies/Labs Reviewed:   EKG:  EKG is ordered today.  The ekg ordered today demonstrates atrial fibrillation, heart rate 89  Recent Labs: 11/24/2017: B Natriuretic Peptide  538.4 11/29/2017: TSH 1.258 11/30/2017: ALT 22 12/02/2017: Magnesium 3.2 12/05/2017: Hemoglobin 11.8; Platelets 192 12/26/2017: BUN 24; Creatinine 2.0; Potassium 3.0; Sodium 138   Lipid Panel    Component Value Date/Time   CHOL 135 08/31/2017 0508   CHOL 214 (H) 02/18/2017 0944   TRIG 34 08/31/2017 0508   HDL 84 08/31/2017 0508   HDL 81 02/18/2017 0944   CHOLHDL 1.6 08/31/2017 0508   VLDL 7 08/31/2017 0508   LDLCALC 44 08/31/2017 0508   LDLCALC 124 (H) 02/18/2017 0944    Additional studies/ records that were reviewed today include:   Echo 08/29/2017 LV EF: 50% - 55% Study Conclusions  - Left ventricle: The cavity size was normal. There was moderate concentric hypertrophy. Systolic function was normal. The estimated ejection fraction was in the range of 50% to 55%. Wall motion was normal; there were no regional wall motion abnormalities. Left ventricular diastolic function parameters were normal. - Ventricular septum: The contour showed diastolic flattening consistent with right ventricular volume overload. - Aortic valve: Transvalvular velocity was within the normal range. There was no stenosis. There was trivial regurgitation. - Right ventricle: The cavity size was mildly dilated. Wall thickness was normal. Systolic function was normal. - Right atrium: The atrium was severely dilated. - Tricuspid valve: There was severe regurgitation. - Pulmonary arteries: Systolic pressure was mildly increased. PA peak pressure: 47 mm Hg (S).    ASSESSMENT:    1. Acute on chronic diastolic heart failure ([place])   2. Benign essential hypertension   3. Persistent atrial fibrillation   4. Chronic obstructive pulmonary disease, unspecified COPD type ([place])   5. Hypothyroidism, unspecified type   6. OSA (obstructive sleep apnea)   7. Chronic kidney disease (CKD), stage IV (severe) (H[facility])      PLAN:  In order of problems listed above:  1. Acute on chronic  diastolic heart failure: EF was normal on recent echocardiogram.  Although patient was discharged on 40 mg a.m./20 mg p.m. of torsemide, per patient, his nephrologist has increased his torsemide to 60 mg twice daily.  He is maintaining discharge weight at 233 pounds.  We discussed salt restriction and daily weight monitoring.  If his weight increased by more than 3 pounds overnight or 5 pounds in single week, he need to contact cardiology service.  Otherwise I will defer to his nephrologist to a follow-up on the renal function and electrolyte after increasing the torsemide.  I plan to bring the patient back in 4 weeks for closer visit given his high risk for readmission.  2. CKD stage IV: Followed by Dr. Jimmy Lambert of nephrology  3. Chronic hypoxic respiratory failure: Due to combination of diastolic heart failure and most important wall COPD history.  Given his need for frequent office visit, I think it is important for him to obtain a portable oxygen concentrator.  His O2 saturation was 83% on arrival to cardiology office today.  4. COPD: He was treated for possible pneumonia during the recent hospitalization  5. Hypertension: Blood pressure is stable on current medication  6. Persistent atrial fibrillation: He is currently not on any AV nodal blocking agent.  Amiodarone was previously taken off due to significant bradycardia while in atrial fibrillation.  Continue on Coumadin.  Last Coumadin INR level was therapeutic.  7.  Hypothyroidism: Managed by primary care provider.    Medication Adjustments/Labs and Tests Ordered: Current medicines are reviewed at length with the patient today.  Concerns regarding medicines are outlined above.  Medication changes, Labs and Tests ordered today are listed in the Patient Instructions below. Patient Instructions  Medication Instructions:  No Changes  If you need a refill on your cardiac medications before your next appointment, please call your pharmacy.    Lab work: None  Testing/Procedures: None  Follow-Up: At [facility], you and your health needs are our priority.  As part of our continuing mission to provide you with exceptional heart care, we have created designated Provider Care Teams.  These Care Teams include your primary Cardiologist (physician) and Advanced Practice Providers (APPs -  Physician Assistants and Nurse Practitioners) who all work together to provide you with the care you need, when you need it. . Follow up in 4 weeks with Almyra Deforest, PA.  Any Other Special Instructions Will Be Listed Below (If Applicable). none      Hilbert Corrigan, [place]  01/10/2018 8:53 AM    [address], [place], [place]  [id number] Phone: [phone]; Fax: [phone]

## 2018-01-10 NOTE — Patient Instructions (Signed)
Medication Instructions:  No Changes  If you need a refill on your cardiac medications before your next appointment, please call your pharmacy.   Lab work: None  Testing/Procedures: None  Follow-Up: At [facility], you and your health needs are our priority.  As part of our continuing mission to provide you with exceptional heart care, we have created designated Provider Care Teams.  These Care Teams include your primary Cardiologist (physician) and Advanced Practice Providers (APPs -  Physician Assistants and Nurse Practitioners) who all work together to provide you with the care you need, when you need it. . Follow up in 4 weeks with Omar Deforest, PA.  Any Other Special Instructions Will Be Listed Below (If Applicable). none

## 2018-01-13 ENCOUNTER — Ambulatory Visit: Payer: Medicare HMO | Admitting: Internal Medicine

## 2018-01-13 DIAGNOSIS — J9611 Chronic respiratory failure with hypoxia: Secondary | ICD-10-CM | POA: Diagnosis not present

## 2018-01-13 DIAGNOSIS — L97311 Non-pressure chronic ulcer of right ankle limited to breakdown of skin: Secondary | ICD-10-CM | POA: Diagnosis not present

## 2018-01-13 DIAGNOSIS — I5033 Acute on chronic diastolic (congestive) heart failure: Secondary | ICD-10-CM | POA: Diagnosis not present

## 2018-01-13 DIAGNOSIS — J441 Chronic obstructive pulmonary disease with (acute) exacerbation: Secondary | ICD-10-CM | POA: Diagnosis not present

## 2018-01-13 DIAGNOSIS — E11622 Type 2 diabetes mellitus with other skin ulcer: Secondary | ICD-10-CM | POA: Diagnosis not present

## 2018-01-13 DIAGNOSIS — N184 Chronic kidney disease, stage 4 (severe): Secondary | ICD-10-CM | POA: Diagnosis not present

## 2018-01-13 DIAGNOSIS — E1122 Type 2 diabetes mellitus with diabetic chronic kidney disease: Secondary | ICD-10-CM | POA: Diagnosis not present

## 2018-01-13 DIAGNOSIS — J9612 Chronic respiratory failure with hypercapnia: Secondary | ICD-10-CM | POA: Diagnosis not present

## 2018-01-13 DIAGNOSIS — I110 Hypertensive heart disease with heart failure: Secondary | ICD-10-CM | POA: Diagnosis not present

## 2018-01-15 ENCOUNTER — Other Ambulatory Visit: Payer: Self-pay | Admitting: Nephrology

## 2018-01-15 ENCOUNTER — Ambulatory Visit
Admission: RE | Admit: 2018-01-15 | Discharge: 2018-01-15 | Disposition: A | Discharge status: Home / Self Care | Payer: Medicare HMO | Source: Ambulatory Visit | Attending: Nephrology | Admitting: Nephrology

## 2018-01-15 DIAGNOSIS — H04123 Dry eye syndrome of bilateral lacrimal glands: Secondary | ICD-10-CM | POA: Diagnosis not present

## 2018-01-15 DIAGNOSIS — I509 Heart failure, unspecified: Secondary | ICD-10-CM

## 2018-01-15 DIAGNOSIS — E119 Type 2 diabetes mellitus without complications: Secondary | ICD-10-CM | POA: Diagnosis not present

## 2018-01-15 DIAGNOSIS — H40013 Open angle with borderline findings, low risk, bilateral: Secondary | ICD-10-CM | POA: Diagnosis not present

## 2018-01-15 DIAGNOSIS — J90 Pleural effusion, not elsewhere classified: Secondary | ICD-10-CM | POA: Diagnosis not present

## 2018-01-15 DIAGNOSIS — G4733 Obstructive sleep apnea (adult) (pediatric): Secondary | ICD-10-CM | POA: Diagnosis not present

## 2018-01-15 DIAGNOSIS — Z961 Presence of intraocular lens: Secondary | ICD-10-CM | POA: Diagnosis not present

## 2018-01-15 LAB — HM DIABETES EYE EXAM

## 2018-01-16 DIAGNOSIS — I110 Hypertensive heart disease with heart failure: Secondary | ICD-10-CM | POA: Diagnosis not present

## 2018-01-16 DIAGNOSIS — E11622 Type 2 diabetes mellitus with other skin ulcer: Secondary | ICD-10-CM | POA: Diagnosis not present

## 2018-01-16 DIAGNOSIS — L97311 Non-pressure chronic ulcer of right ankle limited to breakdown of skin: Secondary | ICD-10-CM | POA: Diagnosis not present

## 2018-01-16 DIAGNOSIS — N184 Chronic kidney disease, stage 4 (severe): Secondary | ICD-10-CM | POA: Diagnosis not present

## 2018-01-16 DIAGNOSIS — J9612 Chronic respiratory failure with hypercapnia: Secondary | ICD-10-CM | POA: Diagnosis not present

## 2018-01-16 DIAGNOSIS — I5033 Acute on chronic diastolic (congestive) heart failure: Secondary | ICD-10-CM | POA: Diagnosis not present

## 2018-01-16 DIAGNOSIS — J9611 Chronic respiratory failure with hypoxia: Secondary | ICD-10-CM | POA: Diagnosis not present

## 2018-01-16 DIAGNOSIS — E1122 Type 2 diabetes mellitus with diabetic chronic kidney disease: Secondary | ICD-10-CM | POA: Diagnosis not present

## 2018-01-16 DIAGNOSIS — J441 Chronic obstructive pulmonary disease with (acute) exacerbation: Secondary | ICD-10-CM | POA: Diagnosis not present

## 2018-01-20 ENCOUNTER — Ambulatory Visit (INDEPENDENT_AMBULATORY_CARE_PROVIDER_SITE_OTHER): Payer: Medicare HMO | Admitting: Cardiology

## 2018-01-20 DIAGNOSIS — L97311 Non-pressure chronic ulcer of right ankle limited to breakdown of skin: Secondary | ICD-10-CM | POA: Diagnosis not present

## 2018-01-20 DIAGNOSIS — Z7901 Long term (current) use of anticoagulants: Secondary | ICD-10-CM | POA: Diagnosis not present

## 2018-01-20 DIAGNOSIS — E1122 Type 2 diabetes mellitus with diabetic chronic kidney disease: Secondary | ICD-10-CM | POA: Diagnosis not present

## 2018-01-20 DIAGNOSIS — E11622 Type 2 diabetes mellitus with other skin ulcer: Secondary | ICD-10-CM | POA: Diagnosis not present

## 2018-01-20 DIAGNOSIS — J9612 Chronic respiratory failure with hypercapnia: Secondary | ICD-10-CM | POA: Diagnosis not present

## 2018-01-20 DIAGNOSIS — I110 Hypertensive heart disease with heart failure: Secondary | ICD-10-CM | POA: Diagnosis not present

## 2018-01-20 DIAGNOSIS — I129 Hypertensive chronic kidney disease with stage 1 through stage 4 chronic kidney disease, or unspecified chronic kidney disease: Secondary | ICD-10-CM | POA: Diagnosis not present

## 2018-01-20 DIAGNOSIS — I4819 Other persistent atrial fibrillation: Secondary | ICD-10-CM

## 2018-01-20 DIAGNOSIS — I5033 Acute on chronic diastolic (congestive) heart failure: Secondary | ICD-10-CM | POA: Diagnosis not present

## 2018-01-20 DIAGNOSIS — J9611 Chronic respiratory failure with hypoxia: Secondary | ICD-10-CM | POA: Diagnosis not present

## 2018-01-20 DIAGNOSIS — N184 Chronic kidney disease, stage 4 (severe): Secondary | ICD-10-CM | POA: Diagnosis not present

## 2018-01-20 DIAGNOSIS — J441 Chronic obstructive pulmonary disease with (acute) exacerbation: Secondary | ICD-10-CM | POA: Diagnosis not present

## 2018-01-20 LAB — POCT INR: INR: 1.9 — AB (ref 2.0–3.0)

## 2018-01-24 DIAGNOSIS — E876 Hypokalemia: Secondary | ICD-10-CM | POA: Diagnosis not present

## 2018-01-25 DIAGNOSIS — E1122 Type 2 diabetes mellitus with diabetic chronic kidney disease: Secondary | ICD-10-CM | POA: Diagnosis not present

## 2018-01-25 DIAGNOSIS — J441 Chronic obstructive pulmonary disease with (acute) exacerbation: Secondary | ICD-10-CM | POA: Diagnosis not present

## 2018-01-25 DIAGNOSIS — I110 Hypertensive heart disease with heart failure: Secondary | ICD-10-CM | POA: Diagnosis not present

## 2018-01-25 DIAGNOSIS — J9612 Chronic respiratory failure with hypercapnia: Secondary | ICD-10-CM | POA: Diagnosis not present

## 2018-01-25 DIAGNOSIS — E11622 Type 2 diabetes mellitus with other skin ulcer: Secondary | ICD-10-CM | POA: Diagnosis not present

## 2018-01-25 DIAGNOSIS — I5033 Acute on chronic diastolic (congestive) heart failure: Secondary | ICD-10-CM | POA: Diagnosis not present

## 2018-01-25 DIAGNOSIS — J9611 Chronic respiratory failure with hypoxia: Secondary | ICD-10-CM | POA: Diagnosis not present

## 2018-01-25 DIAGNOSIS — N184 Chronic kidney disease, stage 4 (severe): Secondary | ICD-10-CM | POA: Diagnosis not present

## 2018-01-25 DIAGNOSIS — L97311 Non-pressure chronic ulcer of right ankle limited to breakdown of skin: Secondary | ICD-10-CM | POA: Diagnosis not present

## 2018-01-27 DIAGNOSIS — I5033 Acute on chronic diastolic (congestive) heart failure: Secondary | ICD-10-CM | POA: Diagnosis not present

## 2018-01-27 DIAGNOSIS — I110 Hypertensive heart disease with heart failure: Secondary | ICD-10-CM | POA: Diagnosis not present

## 2018-01-27 DIAGNOSIS — L97311 Non-pressure chronic ulcer of right ankle limited to breakdown of skin: Secondary | ICD-10-CM | POA: Diagnosis not present

## 2018-01-27 DIAGNOSIS — N184 Chronic kidney disease, stage 4 (severe): Secondary | ICD-10-CM | POA: Diagnosis not present

## 2018-01-27 DIAGNOSIS — E1122 Type 2 diabetes mellitus with diabetic chronic kidney disease: Secondary | ICD-10-CM | POA: Diagnosis not present

## 2018-01-27 DIAGNOSIS — E11622 Type 2 diabetes mellitus with other skin ulcer: Secondary | ICD-10-CM | POA: Diagnosis not present

## 2018-01-27 DIAGNOSIS — J9611 Chronic respiratory failure with hypoxia: Secondary | ICD-10-CM | POA: Diagnosis not present

## 2018-01-27 DIAGNOSIS — J9612 Chronic respiratory failure with hypercapnia: Secondary | ICD-10-CM | POA: Diagnosis not present

## 2018-01-27 DIAGNOSIS — J441 Chronic obstructive pulmonary disease with (acute) exacerbation: Secondary | ICD-10-CM | POA: Diagnosis not present

## 2018-01-30 ENCOUNTER — Other Ambulatory Visit: Payer: Self-pay | Admitting: Pharmacist

## 2018-01-30 ENCOUNTER — Ambulatory Visit: Payer: Medicare HMO | Admitting: Internal Medicine

## 2018-01-30 NOTE — Patient Outreach (Signed)
[facility]) Care Management [place]  01/30/2018  Omar Lambert [birth year] [id number]  Reason for referral: medication assistance  [facility] pharmacy case is being closed due to the following reasons:  Patient assistance forms were not returned to [facility] before [place] deadline.  We are unable to complete PAP for this reason.  Patient has been provided [facility] CM contact information if assistance needed in the future.    Thank you for allowing [facility] pharmacy to be involved in this patient's care.    Regina Eck, PharmD, [employer]  [phone]

## 2018-01-31 ENCOUNTER — Emergency Department (HOSPITAL_COMMUNITY): Payer: Medicare HMO

## 2018-01-31 ENCOUNTER — Inpatient Hospital Stay (HOSPITAL_COMMUNITY)
Admission: EM | Admit: 2018-01-31 | Discharge: 2018-02-16 | DRG: 286 | Disposition: A | Discharge status: Hospice | Payer: Medicare HMO | Attending: Internal Medicine | Admitting: Internal Medicine

## 2018-01-31 ENCOUNTER — Other Ambulatory Visit: Payer: Self-pay

## 2018-01-31 DIAGNOSIS — Z23 Encounter for immunization: Secondary | ICD-10-CM

## 2018-01-31 DIAGNOSIS — Z6835 Body mass index (BMI) 35.0-35.9, adult: Secondary | ICD-10-CM | POA: Diagnosis not present

## 2018-01-31 DIAGNOSIS — E785 Hyperlipidemia, unspecified: Secondary | ICD-10-CM | POA: Diagnosis present

## 2018-01-31 DIAGNOSIS — Z743 Need for continuous supervision: Secondary | ICD-10-CM | POA: Diagnosis not present

## 2018-01-31 DIAGNOSIS — I5031 Acute diastolic (congestive) heart failure: Secondary | ICD-10-CM | POA: Diagnosis not present

## 2018-01-31 DIAGNOSIS — R4182 Altered mental status, unspecified: Secondary | ICD-10-CM | POA: Diagnosis not present

## 2018-01-31 DIAGNOSIS — I4819 Other persistent atrial fibrillation: Secondary | ICD-10-CM | POA: Diagnosis not present

## 2018-01-31 DIAGNOSIS — K5900 Constipation, unspecified: Secondary | ICD-10-CM | POA: Diagnosis present

## 2018-01-31 DIAGNOSIS — I4821 Permanent atrial fibrillation: Secondary | ICD-10-CM | POA: Diagnosis present

## 2018-01-31 DIAGNOSIS — E1122 Type 2 diabetes mellitus with diabetic chronic kidney disease: Secondary | ICD-10-CM | POA: Diagnosis not present

## 2018-01-31 DIAGNOSIS — N179 Acute kidney failure, unspecified: Secondary | ICD-10-CM | POA: Diagnosis not present

## 2018-01-31 DIAGNOSIS — G9341 Metabolic encephalopathy: Secondary | ICD-10-CM | POA: Diagnosis present

## 2018-01-31 DIAGNOSIS — Z9989 Dependence on other enabling machines and devices: Secondary | ICD-10-CM | POA: Diagnosis not present

## 2018-01-31 DIAGNOSIS — J441 Chronic obstructive pulmonary disease with (acute) exacerbation: Secondary | ICD-10-CM | POA: Diagnosis not present

## 2018-01-31 DIAGNOSIS — L8990 Pressure ulcer of unspecified site, unspecified stage: Secondary | ICD-10-CM | POA: Diagnosis not present

## 2018-01-31 DIAGNOSIS — E669 Obesity, unspecified: Secondary | ICD-10-CM | POA: Diagnosis present

## 2018-01-31 DIAGNOSIS — R0902 Hypoxemia: Secondary | ICD-10-CM | POA: Diagnosis not present

## 2018-01-31 DIAGNOSIS — Z79899 Other long term (current) drug therapy: Secondary | ICD-10-CM

## 2018-01-31 DIAGNOSIS — I10 Essential (primary) hypertension: Secondary | ICD-10-CM | POA: Diagnosis not present

## 2018-01-31 DIAGNOSIS — I4891 Unspecified atrial fibrillation: Secondary | ICD-10-CM | POA: Diagnosis not present

## 2018-01-31 DIAGNOSIS — I248 Other forms of acute ischemic heart disease: Secondary | ICD-10-CM | POA: Diagnosis not present

## 2018-01-31 DIAGNOSIS — M109 Gout, unspecified: Secondary | ICD-10-CM | POA: Diagnosis present

## 2018-01-31 DIAGNOSIS — G4733 Obstructive sleep apnea (adult) (pediatric): Secondary | ICD-10-CM | POA: Diagnosis not present

## 2018-01-31 DIAGNOSIS — I130 Hypertensive heart and chronic kidney disease with heart failure and stage 1 through stage 4 chronic kidney disease, or unspecified chronic kidney disease: Secondary | ICD-10-CM | POA: Diagnosis not present

## 2018-01-31 DIAGNOSIS — I110 Hypertensive heart disease with heart failure: Secondary | ICD-10-CM | POA: Diagnosis not present

## 2018-01-31 DIAGNOSIS — Z87891 Personal history of nicotine dependence: Secondary | ICD-10-CM | POA: Diagnosis not present

## 2018-01-31 DIAGNOSIS — R4020 Unspecified coma: Secondary | ICD-10-CM | POA: Diagnosis not present

## 2018-01-31 DIAGNOSIS — Z9111 Patient's noncompliance with dietary regimen: Secondary | ICD-10-CM

## 2018-01-31 DIAGNOSIS — R279 Unspecified lack of coordination: Secondary | ICD-10-CM | POA: Diagnosis not present

## 2018-01-31 DIAGNOSIS — R0602 Shortness of breath: Secondary | ICD-10-CM | POA: Diagnosis not present

## 2018-01-31 DIAGNOSIS — I4892 Unspecified atrial flutter: Secondary | ICD-10-CM | POA: Diagnosis present

## 2018-01-31 DIAGNOSIS — N19 Unspecified kidney failure: Secondary | ICD-10-CM | POA: Diagnosis not present

## 2018-01-31 DIAGNOSIS — I370 Nonrheumatic pulmonary valve stenosis: Secondary | ICD-10-CM | POA: Diagnosis not present

## 2018-01-31 DIAGNOSIS — I361 Nonrheumatic tricuspid (valve) insufficiency: Secondary | ICD-10-CM | POA: Diagnosis not present

## 2018-01-31 DIAGNOSIS — N183 Chronic kidney disease, stage 3 (moderate): Secondary | ICD-10-CM | POA: Diagnosis not present

## 2018-01-31 DIAGNOSIS — I4811 Longstanding persistent atrial fibrillation: Secondary | ICD-10-CM | POA: Diagnosis not present

## 2018-01-31 DIAGNOSIS — Z9119 Patient's noncompliance with other medical treatment and regimen: Secondary | ICD-10-CM

## 2018-01-31 DIAGNOSIS — J90 Pleural effusion, not elsewhere classified: Secondary | ICD-10-CM | POA: Diagnosis not present

## 2018-01-31 DIAGNOSIS — G9349 Other encephalopathy: Secondary | ICD-10-CM | POA: Diagnosis not present

## 2018-01-31 DIAGNOSIS — N184 Chronic kidney disease, stage 4 (severe): Secondary | ICD-10-CM | POA: Diagnosis not present

## 2018-01-31 DIAGNOSIS — I5043 Acute on chronic combined systolic (congestive) and diastolic (congestive) heart failure: Secondary | ICD-10-CM | POA: Diagnosis not present

## 2018-01-31 DIAGNOSIS — F0390 Unspecified dementia without behavioral disturbance: Secondary | ICD-10-CM | POA: Diagnosis present

## 2018-01-31 DIAGNOSIS — N1832 Chronic kidney disease, stage 3b: Secondary | ICD-10-CM | POA: Diagnosis present

## 2018-01-31 DIAGNOSIS — E873 Alkalosis: Secondary | ICD-10-CM | POA: Diagnosis present

## 2018-01-31 DIAGNOSIS — Z7901 Long term (current) use of anticoagulants: Secondary | ICD-10-CM

## 2018-01-31 DIAGNOSIS — R791 Abnormal coagulation profile: Secondary | ICD-10-CM | POA: Diagnosis present

## 2018-01-31 DIAGNOSIS — I425 Other restrictive cardiomyopathy: Secondary | ICD-10-CM | POA: Diagnosis present

## 2018-01-31 DIAGNOSIS — R600 Localized edema: Secondary | ICD-10-CM | POA: Diagnosis not present

## 2018-01-31 DIAGNOSIS — Z515 Encounter for palliative care: Secondary | ICD-10-CM | POA: Diagnosis not present

## 2018-01-31 DIAGNOSIS — I2729 Other secondary pulmonary hypertension: Secondary | ICD-10-CM | POA: Diagnosis present

## 2018-01-31 DIAGNOSIS — Z9981 Dependence on supplemental oxygen: Secondary | ICD-10-CM

## 2018-01-31 DIAGNOSIS — I509 Heart failure, unspecified: Secondary | ICD-10-CM | POA: Diagnosis not present

## 2018-01-31 DIAGNOSIS — Z7189 Other specified counseling: Secondary | ICD-10-CM | POA: Diagnosis not present

## 2018-01-31 DIAGNOSIS — I5033 Acute on chronic diastolic (congestive) heart failure: Secondary | ICD-10-CM | POA: Diagnosis present

## 2018-01-31 DIAGNOSIS — J449 Chronic obstructive pulmonary disease, unspecified: Secondary | ICD-10-CM | POA: Diagnosis not present

## 2018-01-31 DIAGNOSIS — E039 Hypothyroidism, unspecified: Secondary | ICD-10-CM | POA: Diagnosis present

## 2018-01-31 DIAGNOSIS — I5082 Biventricular heart failure: Secondary | ICD-10-CM | POA: Diagnosis present

## 2018-01-31 DIAGNOSIS — K219 Gastro-esophageal reflux disease without esophagitis: Secondary | ICD-10-CM | POA: Diagnosis present

## 2018-01-31 DIAGNOSIS — Z66 Do not resuscitate: Secondary | ICD-10-CM | POA: Diagnosis not present

## 2018-01-31 DIAGNOSIS — J189 Pneumonia, unspecified organism: Secondary | ICD-10-CM | POA: Diagnosis not present

## 2018-01-31 DIAGNOSIS — R0600 Dyspnea, unspecified: Secondary | ICD-10-CM

## 2018-01-31 DIAGNOSIS — Z781 Physical restraint status: Secondary | ICD-10-CM

## 2018-01-31 DIAGNOSIS — Z7951 Long term (current) use of inhaled steroids: Secondary | ICD-10-CM

## 2018-01-31 DIAGNOSIS — E1129 Type 2 diabetes mellitus with other diabetic kidney complication: Secondary | ICD-10-CM | POA: Diagnosis present

## 2018-01-31 LAB — TROPONIN I: Troponin I: 0.08 ng/mL (ref <0.03)

## 2018-01-31 LAB — COMPREHENSIVE METABOLIC PANEL WITH GFR
ALT: 18 U/L (ref 0–44)
AST: 34 U/L (ref 15–41)
Albumin: 3.7 g/dL (ref 3.5–5.0)
Alkaline Phosphatase: 142 U/L — ABNORMAL HIGH (ref 38–126)
Anion gap: 10 (ref 5–15)
BUN: 38 mg/dL — ABNORMAL HIGH (ref 8–23)
CO2: 36 mmol/L — ABNORMAL HIGH (ref 22–32)
Calcium: 9.8 mg/dL (ref 8.9–10.3)
Chloride: 99 mmol/L (ref 98–111)
Creatinine, Ser: 2.43 mg/dL — ABNORMAL HIGH (ref 0.61–1.24)
GFR calc Af Amer: 28 mL/min — ABNORMAL LOW
GFR calc non Af Amer: 25 mL/min — ABNORMAL LOW
Glucose, Bld: 126 mg/dL — ABNORMAL HIGH (ref 70–99)
Potassium: 4.1 mmol/L (ref 3.5–5.1)
Sodium: 145 mmol/L (ref 135–145)
Total Bilirubin: 1.2 mg/dL (ref 0.3–1.2)
Total Protein: 8.1 g/dL (ref 6.5–8.1)

## 2018-01-31 LAB — CBC WITH DIFFERENTIAL/PLATELET
Abs Immature Granulocytes: 0 10*3/uL (ref 0.00–0.07)
Basophils Absolute: 0 10*3/uL (ref 0.0–0.1)
Basophils Relative: 1 %
Eosinophils Absolute: 0.1 10*3/uL (ref 0.0–0.5)
Eosinophils Relative: 2 %
HCT: 36.5 % — ABNORMAL LOW (ref 39.0–52.0)
Hemoglobin: 10.3 g/dL — ABNORMAL LOW (ref 13.0–17.0)
Lymphocytes Relative: 10 %
Lymphs Abs: 0.5 10*3/uL — ABNORMAL LOW (ref 0.7–4.0)
MCH: 31.6 pg (ref 26.0–34.0)
MCHC: 28.2 g/dL — ABNORMAL LOW (ref 30.0–36.0)
MCV: 112 fL — ABNORMAL HIGH (ref 80.0–100.0)
Monocytes Absolute: 0.5 10*3/uL (ref 0.1–1.0)
Monocytes Relative: 11 %
Neutro Abs: 3.4 10*3/uL (ref 1.7–7.7)
Neutrophils Relative %: 76 %
Platelets: 156 10*3/uL (ref 150–400)
RBC: 3.26 MIL/uL — ABNORMAL LOW (ref 4.22–5.81)
RDW: 17.2 % — ABNORMAL HIGH (ref 11.5–15.5)
WBC: 4.5 10*3/uL (ref 4.0–10.5)
nRBC: 0 % (ref 0.0–0.2)
nRBC: 0 /100{WBCs}

## 2018-01-31 LAB — I-STAT TROPONIN, ED: Troponin i, poc: 0.08 ng/mL (ref 0.00–0.08)

## 2018-01-31 LAB — I-STAT CG4 LACTIC ACID, ED
Lactic Acid, Venous: 3.4 mmol/L (ref 0.5–1.9)
Lactic Acid, Venous: 2.01 mmol/L (ref 0.5–1.9)

## 2018-01-31 LAB — PROTIME-INR
INR: 2.08
Prothrombin Time: 23.1 s — ABNORMAL HIGH (ref 11.4–15.2)

## 2018-01-31 LAB — CBG MONITORING, ED: Glucose-Capillary: 142 mg/dL — ABNORMAL HIGH (ref 70–99)

## 2018-01-31 LAB — INFLUENZA PANEL BY PCR (TYPE A & B)
Influenza A By PCR: NEGATIVE
Influenza B By PCR: NEGATIVE

## 2018-01-31 LAB — BRAIN NATRIURETIC PEPTIDE: B Natriuretic Peptide: 494.5 pg/mL — ABNORMAL HIGH (ref 0.0–100.0)

## 2018-01-31 LAB — MRSA PCR SCREENING: MRSA by PCR: NEGATIVE

## 2018-01-31 LAB — GLUCOSE, CAPILLARY: Glucose-Capillary: 139 mg/dL — ABNORMAL HIGH (ref 70–99)

## 2018-01-31 MED ORDER — CALCITRIOL 0.25 MCG PO CAPS
0.2500 ug | ORAL_CAPSULE | ORAL | Status: DC
  Administered 2018-02-02 – 2018-02-16 (×8): 0.25 ug via ORAL
  Filled 2018-01-31 (×8): qty 1

## 2018-01-31 MED ORDER — FUROSEMIDE 10 MG/ML IJ SOLN
80.0000 mg | Freq: Once | INTRAMUSCULAR | Status: AC
  Administered 2018-01-31: 80 mg via INTRAVENOUS
  Filled 2018-01-31: qty 8

## 2018-01-31 MED ORDER — SODIUM CHLORIDE 0.9 % IV SOLN
500.0000 mg | Freq: Once | INTRAVENOUS | Status: AC
  Administered 2018-01-31: 500 mg via INTRAVENOUS
  Filled 2018-01-31: qty 500

## 2018-01-31 MED ORDER — ADULT MULTIVITAMIN W/MINERALS CH
1.0000 | ORAL_TABLET | Freq: Every day | ORAL | Status: DC
  Administered 2018-01-31 – 2018-02-16 (×17): 1 via ORAL
  Filled 2018-01-31 (×17): qty 1

## 2018-01-31 MED ORDER — UMECLIDINIUM-VILANTEROL 62.5-25 MCG/INH IN AEPB
1.0000 | INHALATION_SPRAY | Freq: Every day | RESPIRATORY_TRACT | Status: DC
  Administered 2018-01-31 – 2018-02-16 (×14): 1 via RESPIRATORY_TRACT
  Filled 2018-01-31 (×2): qty 14

## 2018-01-31 MED ORDER — PANTOPRAZOLE SODIUM 40 MG PO TBEC
40.0000 mg | DELAYED_RELEASE_TABLET | Freq: Every day | ORAL | Status: DC
  Administered 2018-01-31 – 2018-02-16 (×17): 40 mg via ORAL
  Filled 2018-01-31 (×17): qty 1

## 2018-01-31 MED ORDER — WARFARIN SODIUM 2.5 MG PO TABS: 2.5000 mg | ORAL_TABLET | ORAL | Status: DC

## 2018-01-31 MED ORDER — IPRATROPIUM-ALBUTEROL 0.5-2.5 (3) MG/3ML IN SOLN
3.0000 mL | Freq: Once | RESPIRATORY_TRACT | Status: AC
  Administered 2018-01-31: 3 mL via RESPIRATORY_TRACT
  Filled 2018-01-31: qty 3

## 2018-01-31 MED ORDER — FLUTICASONE PROPIONATE 50 MCG/ACT NA SUSP
2.0000 | Freq: Every day | NASAL | Status: DC | PRN
  Filled 2018-01-31: qty 16

## 2018-01-31 MED ORDER — CENTRUM SILVER ADULT 50+ PO TABS: ORAL_TABLET | Freq: Every day | ORAL | Status: DC

## 2018-01-31 MED ORDER — SODIUM CHLORIDE 0.9 % IV SOLN
250.0000 mL | INTRAVENOUS | Status: DC | PRN
  Administered 2018-02-02: 250 mL via INTRAVENOUS

## 2018-01-31 MED ORDER — ROSUVASTATIN CALCIUM 20 MG PO TABS
20.0000 mg | ORAL_TABLET | Freq: Every day | ORAL | Status: DC
  Administered 2018-01-31 – 2018-02-16 (×17): 20 mg via ORAL
  Filled 2018-01-31 (×16): qty 1

## 2018-01-31 MED ORDER — METHYLPREDNISOLONE SODIUM SUCC 125 MG IJ SOLR
125.0000 mg | Freq: Once | INTRAMUSCULAR | Status: AC
  Administered 2018-01-31: 125 mg via INTRAVENOUS
  Filled 2018-01-31: qty 2

## 2018-01-31 MED ORDER — SODIUM CHLORIDE 0.9% FLUSH
3.0000 mL | Freq: Two times a day (BID) | INTRAVENOUS | Status: DC
  Administered 2018-01-31 – 2018-02-16 (×24): 3 mL via INTRAVENOUS

## 2018-01-31 MED ORDER — POTASSIUM CHLORIDE CRYS ER 20 MEQ PO TBCR
20.0000 meq | EXTENDED_RELEASE_TABLET | Freq: Every day | ORAL | Status: DC
  Administered 2018-01-31 – 2018-02-16 (×17): 20 meq via ORAL
  Filled 2018-01-31 (×17): qty 1

## 2018-01-31 MED ORDER — WARFARIN - PHARMACIST DOSING INPATIENT: Freq: Every day | Status: DC

## 2018-01-31 MED ORDER — ALBUTEROL SULFATE (2.5 MG/3ML) 0.083% IN NEBU: 2.5000 mg | INHALATION_SOLUTION | RESPIRATORY_TRACT | Status: DC | PRN

## 2018-01-31 MED ORDER — ONDANSETRON HCL 4 MG/2ML IJ SOLN: 4.0000 mg | Freq: Four times a day (QID) | INTRAMUSCULAR | Status: DC | PRN

## 2018-01-31 MED ORDER — ISOSORBIDE MONONITRATE ER 30 MG PO TB24
30.0000 mg | ORAL_TABLET | Freq: Every day | ORAL | Status: DC
  Administered 2018-01-31 – 2018-02-16 (×17): 30 mg via ORAL
  Filled 2018-01-31 (×17): qty 1

## 2018-01-31 MED ORDER — CALCITRIOL 0.25 MCG PO CAPS
0.5000 ug | ORAL_CAPSULE | ORAL | Status: DC
  Administered 2018-02-03 – 2018-02-15 (×6): 0.5 ug via ORAL
  Filled 2018-01-31 (×6): qty 2

## 2018-01-31 MED ORDER — LEVOTHYROXINE SODIUM 75 MCG PO TABS
175.0000 ug | ORAL_TABLET | Freq: Every day | ORAL | Status: DC
  Administered 2018-02-01 – 2018-02-16 (×15): 175 ug via ORAL
  Filled 2018-01-31: qty 3
  Filled 2018-01-31 (×15): qty 1

## 2018-01-31 MED ORDER — WARFARIN SODIUM 5 MG PO TABS
5.0000 mg | ORAL_TABLET | ORAL | Status: DC
  Administered 2018-01-31: 5 mg via ORAL
  Filled 2018-01-31 (×2): qty 1

## 2018-01-31 MED ORDER — FUROSEMIDE 10 MG/ML IJ SOLN
120.0000 mg | Freq: Three times a day (TID) | INTRAVENOUS | Status: AC
  Administered 2018-01-31 – 2018-02-03 (×10): 120 mg via INTRAVENOUS
  Filled 2018-01-31: qty 10
  Filled 2018-01-31 (×2): qty 12
  Filled 2018-01-31: qty 10
  Filled 2018-01-31: qty 12
  Filled 2018-01-31 (×5): qty 10

## 2018-01-31 MED ORDER — SODIUM CHLORIDE 0.9% FLUSH: 3.0000 mL | INTRAVENOUS | Status: DC | PRN

## 2018-01-31 MED ORDER — ACETAMINOPHEN 325 MG PO TABS: 650.0000 mg | ORAL_TABLET | Freq: Four times a day (QID) | ORAL | Status: DC | PRN

## 2018-01-31 MED ORDER — ALLOPURINOL 300 MG PO TABS
300.0000 mg | ORAL_TABLET | Freq: Every day | ORAL | Status: DC
  Administered 2018-01-31 – 2018-02-14 (×15): 300 mg via ORAL
  Filled 2018-01-31 (×15): qty 1

## 2018-01-31 NOTE — ED Notes (Signed)
Pt sleeping. 

## 2018-01-31 NOTE — ED Provider Notes (Signed)
[facility] EMERGENCY DEPARTMENT Provider Note   CSN: [id number] Arrival date & time: 01/31/18  1132     History   Chief Complaint Chief Complaint  Patient presents with  . Shortness of Breath    HPI Omar Lambert is a 77 y.o. male.  HPI   77yo male with history of atrial fibrillation on coumadin, htn, CHF, CKD, COPD, DM, who presents with cncern for shortness of breath.   Report cough for one week with shortness of breath beginning on christmas and progressing. Reports he has increased swelling of the bilateral lower extremities. Has not check weights daily. No fevers. Does report orthopnea.   Past Medical History:  Diagnosis Date  . Acute kidney injury superimposed on chronic kidney disease ([place]) 11/2016  . Acute respiratory failure ([place]) 11/2016  . Allergy   . Asthma   . Atrial fibrillation ([place])   . 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 Benign essential hypertension   . CHF (congestive heart failure) ([place])   . CKD (chronic kidney disease), stage III ([place])   . COPD (chronic obstructive pulmonary disease) ([place])   . Diabetes ([place])   . Diabetes mellitus without complication ([place])   . Dyspnea   . Gastritis   . GERD (gastroesophageal reflux disease)   . Gout attack 09/2012  . Hypertension   . Sinusitis, maxillary, chronic   . Thyroid disease   . Type II or unspecified type diabetes mellitus without mention of complication, not stated as uncontrolled     Patient Active Problem List   Diagnosis Date Noted  . Acute on chronic diastolic (congestive) heart failure ([place]) 01/31/2018  . Pressure injury of skin 11/27/2017  . Anemia 11/24/2017  . HCAP (healthcare-associated pneumonia) 11/24/2017  . Pleural effusion on right 10/11/2017  . Chronic respiratory failure with hypoxia and hypercapnia ([place]) 09/10/2017  . Chronic cor pulmonale ([place]) 09/10/2017  . Acute CHF (congestive heart failure) ([place]) 08/29/2017  . Respiratory distress  08/29/2017  . Hypokalemia 08/29/2017  . Diabetic ulcer of lower leg ([place]) 08/29/2017  . CKD (chronic kidney disease), stage III ([place])   . Acute on chronic diastolic CHF (congestive heart failure) ([place])   . Elevated troponin I level   . Acute on chronic diastolic heart failure ([place]) 07/29/2017  . Chronic venous hypertension (idiopathic) with ulcer of right lower extremity (CODE) ([place]) 07/29/2017  . Blood clotting disorder ([place]) 12/03/2016  . Long term (current) use of anticoagulants [Z79.01] 11/12/2016  . Bradycardia with 41-50 beats per minute   . Acute kidney injury superimposed on chronic kidney disease ([place])   . DOE (dyspnea on exertion) 04/10/2016  . Diabetes mellitus with renal complications ([place])   . Pneumonia   . Hypothyroidism 03/29/2016  . Essential hypertension, benign 02/28/2015  . 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 Morbid obesity ([place]) 02/28/2015  . Hyperlipidemia associated with type 2 diabetes mellitus ([place]) 02/28/2015  . Persistent atrial fibrillation 12/18/2014  . Alteration in anticoagulation 12/18/2014  . Chronic kidney disease (CKD) stage G3b/A2, moderately decreased glomerular filtration rate (GFR) between 30-44 mL/min/1.73 square meter and albuminuria creatinine ratio between 30-299 mg/g (HCC) 08/19/2014  . Noncompliance with medication regimen 02/15/2014  . Environmental allergies 02/15/2014  . 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 Esophageal reflux 09/18/2013  . Venous insufficiency of both lower extremities 03/14/2013  . OSA on CPAP 12/22/2012  . COPD with acute exacerbation ([place]) 11/26/2012  . Asthma   . Benign essential hypertension     Past Surgical  History:  Procedure Laterality Date  . COLONOSCOPY  2003  . EYE SURGERY Left 05/06/2017   Cataract removed  . EYE SURGERY Right 02/05/2013   Cataract removed  . HERNIA REPAIR  1980  . IR THORACENTESIS ASP PLEURAL SPACE W/IMG GUIDE  12/02/2017        Home Medications    Prior to Admission medications     Medication Sig Start Date End Date Taking? Authorizing Provider  acetaminophen (TYLENOL) 325 MG tablet Take 650 mg by mouth every 6 (six) hours as needed for mild pain.   Yes [provider]  albuterol (PROVENTIL) (2.5 MG/3ML) 0.083% nebulizer solution INHALE CONTENTS OF 1 VIAL IN NEBULIZER EVERY 4 HOURS AS NEEDED FOR WHEEZING AND ASTHMA Patient taking differently: Take 2.5 mg by nebulization every 4 (four) hours as needed for wheezing (and asthma).  07/18/17  Yes [name], [name] L, DO  allopurinol (ZYLOPRIM) 300 MG tablet TAKE 1 TABLET BY MOUTH EVERY DAY Patient taking differently: Take 300 mg by mouth daily.  11/04/17  Yes [name], [name] L, DO  calcitRIOL (ROCALTROL) 0.25 MCG capsule Take 0.25-0.5 mcg by mouth See admin instructions. Take 0.25 mcg by mouth every other day, alternating with 0.5 mcg   Yes [provider]  fluticasone (FLONASE) 50 MCG/ACT nasal spray SPRAY 2 SPRAYS INTO EACH NOSTRIL EVERY DAY Patient taking differently: Place 2 sprays into both nostrils daily as needed for allergies or rhinitis.  12/09/17  Yes [name], [name] L, DO  KLOR-CON M20 20 MEQ tablet TAKE 1 TABLET BY MOUTH EVERY DAY Patient taking differently: Take 20 mEq by mouth daily.  04/09/16  Yes [name], [name] L, DO  levothyroxine (SYNTHROID, LEVOTHROID) 175 MCG tablet TAKE 1 TABLET EVERY DAY BEFORE BREAKFAST Patient taking differently: Take 175 mcg by mouth daily before breakfast.  11/12/17  Yes [name], [name] L, DO  Multiple Vitamins-Minerals (CENTRUM SILVER ADULT 50+ PO) Take 1 tablet by mouth daily.   Yes [provider]  OXYGEN Inhale 2 L into the lungs continuous.   Yes [provider]  pantoprazole (PROTONIX) 40 MG tablet Take 1 tablet (40 mg total) by mouth daily. 12/13/17  Yes Lauree Chandler, NP  rosuvastatin (CRESTOR) 20 MG tablet Take 1 tablet (20 mg total) by mouth daily. 11/12/17  Yes Troy Sine, MD  torsemide (DEMADEX) 20 MG tablet Take 60 mg by mouth See admin  instructions. Take 60 mg by mouth 2 times a day- morning and noon   Yes [provider]  UNABLE TO FIND CPAP- At bedtime and during all naps   Yes [provider]  warfarin (COUMADIN) 5 MG tablet TAKE 1/2 TO 1 TABLET BY MOUTH DAILY AS DIRECTED BY COUMADIN CLINIC Patient taking differently: Take 2.5-5 mg by mouth See admin instructions. Take 2.5 mg by mouth in the morning on Sun/Tues/Wed/Thurs/Sat and 5 mg on Mon/Fri 12/03/17  Yes Troy Sine, MD  isosorbide mononitrate (IMDUR) 30 MG 24 hr tablet Take 1 tablet (30 mg total) by mouth daily. 11/12/17   Troy Sine, MD  umeclidinium-vilanterol (ANORO ELLIPTA) 62.5-25 MCG/INH AEPB Inhale 1 puff into the lungs daily. Patient not taking: Reported on 01/31/2018 12/30/17   Tanda Rockers, MD    Family History Family History  Problem Relation Age of Onset  . Stroke Mother   . Diabetes Mother   . Cancer Sister   . Diabetes Sister   . COPD Sister   . COPD Sister   . Diabetes Father  Social History Social History   Tobacco Use  . Smoking status: Former Smoker    Packs/day: 1.00    Years: 20.00    Pack years: 20.00    Types: Cigarettes    Last attempt to quit: 02/05/1997    Years since quitting: 21.0  . Smokeless tobacco: Never Used  Substance Use Topics  . Alcohol use: No  . Drug use: No     Allergies   Patient has no known allergies.   Review of Systems Review of Systems  Constitutional: Negative for fever.  HENT: Negative for sore throat.   Eyes: Negative for visual disturbance.  Respiratory: Positive for cough and shortness of breath.   Cardiovascular: Positive for leg swelling. Negative for chest pain.  Gastrointestinal: Negative for abdominal pain, nausea and vomiting.  Genitourinary: Negative for difficulty urinating.  Musculoskeletal: Negative for back pain and neck stiffness.  Skin: Negative for rash.  Neurological: Negative for syncope and headaches.     Physical Exam Updated Vital  Signs BP 132/81   Pulse 91   Temp 98.4 F (36.9 C) (Oral)   Resp (!) 21   Wt 107.5 kg   SpO2 99%   BMI 35.00 kg/m   Physical Exam Vitals signs and nursing note reviewed.  Constitutional:      General: He is not in acute distress.    Appearance: He is well-developed. He is not diaphoretic.  HENT:     Head: Normocephalic and atraumatic.  Eyes:     Conjunctiva/sclera: Conjunctivae normal.  Neck:     Musculoskeletal: Normal range of motion.  Cardiovascular:     Rate and Rhythm: Normal rate and regular rhythm.     Heart sounds: Normal heart sounds. No murmur. No friction rub. No gallop.   Pulmonary:     Effort: Bradypnea, accessory muscle usage and respiratory distress present.     Breath sounds: Decreased breath sounds present. No wheezing or rales.  Abdominal:     General: There is no distension.     Palpations: Abdomen is soft.     Tenderness: There is no abdominal tenderness. There is no guarding.  Skin:    General: Skin is warm and dry.  Neurological:     Mental Status: He is alert and oriented to person, place, and time.      ED Treatments / Results  Labs (all labs ordered are listed, but only abnormal results are displayed) Labs Reviewed  CBC WITH DIFFERENTIAL/PLATELET - Abnormal; Notable for the following components:      Result Value   RBC 3.26 (*)    Hemoglobin 10.3 (*)    HCT 36.5 (*)    MCV 112.0 (*)    MCHC 28.2 (*)    RDW 17.2 (*)    Lymphs Abs 0.5 (*)    All other components within normal limits  COMPREHENSIVE METABOLIC PANEL - Abnormal; Notable for the following components:   CO2 36 (*)    Glucose, Bld 126 (*)    BUN 38 (*)    Creatinine, Ser 2.43 (*)    Alkaline Phosphatase 142 (*)    GFR calc non Af Amer 25 (*)    GFR calc Af Amer 28 (*)    All other components within normal limits  BRAIN NATRIURETIC PEPTIDE - Abnormal; Notable for the following components:   B Natriuretic Peptide 494.5 (*)    All other components within normal limits    PROTIME-INR - Abnormal; Notable for the following components:   Prothrombin Time  23.1 (*)    All other components within normal limits  GLUCOSE, CAPILLARY - Abnormal; Notable for the following components:   Glucose-Capillary 139 (*)    All other components within normal limits  I-STAT CG4 LACTIC ACID, ED - Abnormal; Notable for the following components:   Lactic Acid, Venous 3.40 (*)    All other components within normal limits  I-STAT CG4 LACTIC ACID, ED - Abnormal; Notable for the following components:   Lactic Acid, Venous 2.01 (*)    All other components within normal limits  CBG MONITORING, ED - Abnormal; Notable for the following components:   Glucose-Capillary 142 (*)    All other components within normal limits  CULTURE, BLOOD (ROUTINE X 2)  CULTURE, BLOOD (ROUTINE X 2)  MRSA PCR SCREENING  INFLUENZA PANEL BY PCR (TYPE A & B)  PROTIME-INR  BASIC METABOLIC PANEL  TROPONIN I  TROPONIN I  TROPONIN I  I-STAT TROPONIN, ED    EKG None  Radiology Dg Chest Portable 1 View  Result Date: 01/31/2018 CLINICAL DATA:  77 year old male with shortness of breath for the past 2-3 days EXAM: PORTABLE CHEST 1 VIEW COMPARISON:  Prior chest x-ray 01/15/2018 FINDINGS: Marked cardiomegaly which likely appears exaggerated given portable frontal technique. Mediastinal widening is similar compared to prior. Atherosclerotic calcifications again noted in the transverse aorta. Marked pulmonary vascular congestion with mild interstitial pulmonary edema. Moderately large and potentially partially loculated right pleural effusion has increased compared to prior. No evidence of pneumothorax. No acute osseous abnormality. IMPRESSION: 1. Slightly worsened pulmonary vascular congestion now with mild interstitial edema. 2. Enlarging and likely partially loculated right pleural effusion. 3. Similar degree of cardiomegaly and Aortic Atherosclerosis (ICD10-170.0) Electronically Signed   By: Jacqulynn Cadet M.D.    On: 01/31/2018 12:49    Procedures .Critical Care Performed by: Gareth Morgan, MD Authorized by: Gareth Morgan, MD   Critical care provider statement:    Critical care time (minutes):  30   Critical care was necessary to treat or prevent imminent or life-threatening deterioration of the following conditions:  Respiratory failure   Critical care was time spent personally by me on the following activities:  Discussions with consultants, evaluation of patient's response to treatment, examination of patient, ordering and performing treatments and interventions, ordering and review of laboratory studies, ordering and review of radiographic studies, pulse oximetry, re-evaluation of patient's condition, obtaining history from patient or surrogate, review of old charts and development of treatment plan with patient or surrogate   (including critical care time)  Medications Ordered in ED Medications  acetaminophen (TYLENOL) tablet 650 mg (has no administration in time range)  allopurinol (ZYLOPRIM) tablet 300 mg (has no administration in time range)  isosorbide mononitrate (IMDUR) 24 hr tablet 30 mg (has no administration in time range)  rosuvastatin (CRESTOR) tablet 20 mg (has no administration in time range)  furosemide (LASIX) 120 mg in dextrose 5 % 50 mL IVPB (has no administration in time range)  calcitRIOL (ROCALTROL) capsule 0.25 mcg (has no administration in time range)  levothyroxine (SYNTHROID, LEVOTHROID) tablet 175 mcg (has no administration in time range)  pantoprazole (PROTONIX) EC tablet 40 mg (has no administration in time range)  potassium chloride SA (K-DUR,KLOR-CON) CR tablet 20 mEq (has no administration in time range)  albuterol (PROVENTIL) (2.5 MG/3ML) 0.083% nebulizer solution 2.5 mg (has no administration in time range)  fluticasone (FLONASE) 50 MCG/ACT nasal spray 2 spray (has no administration in time range)  umeclidinium-vilanterol (ANORO ELLIPTA) 62.5-25 MCG/INH  1  puff (1 puff Inhalation Given 01/31/18 2159)  sodium chloride flush (NS) 0.9 % injection 3 mL (has no administration in time range)  sodium chloride flush (NS) 0.9 % injection 3 mL (has no administration in time range)  0.9 %  sodium chloride infusion (has no administration in time range)  ondansetron (ZOFRAN) injection 4 mg (has no administration in time range)  warfarin (COUMADIN) tablet 5 mg (has no administration in time range)  warfarin (COUMADIN) tablet 2.5 mg (has no administration in time range)  Warfarin - Pharmacist Dosing Inpatient ( Does not apply Duplicate 70/62/37 [id number])  calcitRIOL (ROCALTROL) capsule 0.5 mcg (has no administration in time range)  multivitamin with minerals tablet 1 tablet (has no administration in time range)  ipratropium-albuterol (DUONEB) 0.5-2.5 (3) MG/3ML nebulizer solution 3 mL (3 mLs Nebulization Given 01/31/18 1243)  methylPREDNISolone sodium succinate (SOLU-MEDROL) 125 mg/2 mL injection 125 mg (125 mg Intravenous Given 01/31/18 1236)  azithromycin (ZITHROMAX) 500 mg in sodium chloride 0.9 % 250 mL IVPB (0 mg Intravenous Stopped 01/31/18 1721)  furosemide (LASIX) injection 80 mg (80 mg Intravenous Given 01/31/18 1349)     Initial Impression / Assessment and Plan / ED Course  I have reviewed the triage vital signs and the nursing notes.  Pertinent labs & imaging results that were available during my care of the patient were reviewed by me and considered in my medical decision making (see chart for details).     77yo male with history of atrial fibrillation on coumadin, htn, CHF, CKD, COPD, DM, who presents with concern for shortness of breath.  Differential diagnosis for dyspnea includes ACS, PE, COPD exacerbation, CHF pulmonary edema.Marland Kitchen EKG was evaluated by me which showed no acute findings.   Hx and exam most consistent with mixed COPD/CHF exacerbation.  GIven solumedrol, duonebs, lasix.  Placed on bipap on arrival given significant dyspnea, work of  breathing. Will admit for further care.   Final Clinical Impressions(s) / ED Diagnoses   Final diagnoses:  COPD exacerbation ([place])  Acute on chronic congestive heart failure, unspecified heart failure type [facility])    ED Discharge Orders    None       Gareth Morgan, MD 01/31/18 2207

## 2018-01-31 NOTE — ED Notes (Signed)
Pt reports that he is ok at [facility]

## 2018-01-31 NOTE — Progress Notes (Signed)
Patient stated he was breathing better. Placed patient on nasal cannula at 4lpm with Sp02=98%. Will continue to monitor patient.

## 2018-01-31 NOTE — Progress Notes (Signed)
ANTICOAGULATION CONSULT NOTE - Initial Consult  Pharmacy Consult for Coumadin Indication: atrial fibrillation  No Known Allergies  Patient Measurements:    Vital Signs: Temp: 98.4 F (36.9 C) (12/27 1144) Temp Source: Oral (12/27 1144) BP: 159/133 (12/27 1730) Pulse Rate: 102 (12/27 1730)  Labs: Recent Labs    01/31/18 1226 01/31/18 1341  HGB 10.3*  --   HCT 36.5*  --   PLT 156  --   LABPROT  --  23.1*  INR  --  2.08  CREATININE 2.43*  --     CrCl cannot be calculated (Unknown ideal weight.).   Medical History: Past Medical History:  Diagnosis Date  . Acute kidney injury superimposed on chronic kidney disease ([place]) 11/2016  . Acute respiratory failure ([place]) 11/2016  . Allergy   . Asthma   . Atrial fibrillation ([place])   . 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 Benign essential hypertension   . CHF (congestive heart failure) ([place])   . CKD (chronic kidney disease), stage III ([place])   . COPD (chronic obstructive pulmonary disease) ([place])   . Diabetes ([place])   . Diabetes mellitus without complication ([place])   . Dyspnea   . Gastritis   . GERD (gastroesophageal reflux disease)   . Gout attack 09/2012  . Hypertension   . Sinusitis, maxillary, chronic   . Thyroid disease   . Type II or unspecified type diabetes mellitus without mention of complication, not stated as uncontrolled     Medications:  See electronic med rec  Assessment: 77 y.o. M presents with SOB, swelling. Pt on coumadin PTA for afib. INR 2.08 (therapeutic) on admission. Hgb low at 10.3, plt wnl. Home dose: 2.5mg  daily except for 5mg  on Mon/Fri  Goal of Therapy:  INR 2-3 Monitor platelets by anticoagulation protocol: Yes   Plan:  Continue home dose of coumadin 2.5mg  daily except for 5mg  on Mon and Fri Daily INR  Sherlon Handing, PharmD, BCPS Clinical pharmacist  **Pharmacist phone directory can now be found on [email or web address] ([email or web address]).  Listed under [place]. 01/31/2018,5:43  PM

## 2018-01-31 NOTE — ED Notes (Signed)
Pt contniues on bi-pap  Unable to give po meds until he is removed from the bi-pap

## 2018-01-31 NOTE — H&P (Signed)
History and Physical    SANFORD LINDBLAD NMM:[id number] DOB: 11-27-1940 DOA: 01/31/2018  PCP: Gayland Curry, DO  Patient coming from: Home    Chief Complaint: SOB, swelling  HPI: Omar Lambert is a 77 y.o. male with medical history significant of DM2, Hypothyroidism, HTN, Gout, GERD, COPD, CKD, diastolic CHF, asthma who presents for SOB and swelling.  Patient's family noticed that last week he became sleepier, this is usually a sign that he is not doing well.  He has slowly had an increase in abdominal girth and LE swelling to the point that he cannot button his pants.  The morning of admission, he awoke and noted he couldn't breathe.  He has further had orthopnea.  He has been taking his torsemide, but over the holiday and increased salt intake to explain the weight gain.  He has not actively weighed himself, and his wife had difficulty getting in with his regular doctors, so he presented here.  He has had no wheezing, no fever, no chills.  Only increased sleep.  He is feeling better on bipap.   ED Course: In the ed, he was given IV lasix 80mg  with limited urine output.  He was placed on bipap which did help with his breathing. CXR showed increased edema.  Lactic acid was 3.4 and imprvoed to 2.  TnI was 0.08.  BNP was 494 (he is obese).  He further had Cr of 2.43, but this appears within his baseline.    Review of Systems: As per HPI otherwise 10 point review of systems negative.   Past Medical History:  Diagnosis Date  . Acute kidney injury superimposed on chronic kidney disease ([place]) 11/2016  . Acute respiratory failure ([place]) 11/2016  . Allergy   . Asthma   . Atrial fibrillation ([place])   . 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 Benign essential hypertension   . CHF (congestive heart failure) ([place])   . CKD (chronic kidney disease), stage III ([place])   . COPD (chronic obstructive pulmonary disease) ([place])   . Diabetes ([place])   . Diabetes mellitus without complication  ([place])   . Dyspnea   . Gastritis   . GERD (gastroesophageal reflux disease)   . Gout attack 09/2012  . Hypertension   . Sinusitis, maxillary, chronic   . Thyroid disease   . Type II or unspecified type diabetes mellitus without mention of complication, not stated as uncontrolled     Past Surgical History:  Procedure Laterality Date  . COLONOSCOPY  2003  . EYE SURGERY Left 05/06/2017   Cataract removed  . EYE SURGERY Right 02/05/2013   Cataract removed  . HERNIA REPAIR  1980  . IR THORACENTESIS ASP PLEURAL SPACE W/IMG GUIDE  12/02/2017     reports that he quit smoking about 21 years ago. His smoking use included cigarettes. He has a 20.00 pack-year smoking history. He has never used smokeless tobacco. He reports that he does not drink alcohol or use drugs.  No Known Allergies  Family History  Problem Relation Age of Onset  . Stroke Mother   . Diabetes Mother   . Cancer Sister   . Diabetes Sister   . COPD Sister   . COPD Sister   . Diabetes Father     Prior to Admission medications   Medication Sig Start Date End Date Taking? Authorizing Provider  acetaminophen (TYLENOL) 325 MG tablet Take 650 mg by mouth every 6 (six) hours as needed for mild pain.  Yes [provider]  albuterol (PROVENTIL) (2.5 MG/3ML) 0.083% nebulizer solution INHALE CONTENTS OF 1 VIAL IN NEBULIZER EVERY 4 HOURS AS NEEDED FOR WHEEZING AND ASTHMA Patient taking differently: Take 2.5 mg by nebulization every 4 (four) hours as needed for wheezing (and asthma).  07/18/17  Yes [name], [name] L, DO  allopurinol (ZYLOPRIM) 300 MG tablet TAKE 1 TABLET BY MOUTH EVERY DAY Patient taking differently: Take 300 mg by mouth daily.  11/04/17  Yes [name], [name] L, DO  calcitRIOL (ROCALTROL) 0.25 MCG capsule Take 0.25-0.5 mcg by mouth See admin instructions. Take 0.25 mcg by mouth every other day, alternating with 0.5 mcg   Yes [provider]  fluticasone (FLONASE) 50 MCG/ACT nasal spray SPRAY 2 SPRAYS  INTO EACH NOSTRIL EVERY DAY Patient taking differently: Place 2 sprays into both nostrils daily as needed for allergies or rhinitis.  12/09/17  Yes [name], [name] L, DO  KLOR-CON M20 20 MEQ tablet TAKE 1 TABLET BY MOUTH EVERY DAY Patient taking differently: Take 20 mEq by mouth daily.  04/09/16  Yes [name], [name] L, DO  levothyroxine (SYNTHROID, LEVOTHROID) 175 MCG tablet TAKE 1 TABLET EVERY DAY BEFORE BREAKFAST Patient taking differently: Take 175 mcg by mouth daily before breakfast.  11/12/17  Yes [name], [name] L, DO  Multiple Vitamins-Minerals (CENTRUM SILVER ADULT 50+ PO) Take 1 tablet by mouth daily.   Yes [provider]  OXYGEN Inhale 2 L into the lungs continuous.   Yes [provider]  pantoprazole (PROTONIX) 40 MG tablet Take 1 tablet (40 mg total) by mouth daily. 12/13/17  Yes Lauree Chandler, NP  rosuvastatin (CRESTOR) 20 MG tablet Take 1 tablet (20 mg total) by mouth daily. 11/12/17  Yes Troy Sine, MD  torsemide (DEMADEX) 20 MG tablet Take 60 mg by mouth See admin instructions. Take 60 mg by mouth 2 times a day- morning and noon   Yes [provider]  UNABLE TO FIND CPAP- At bedtime and during all naps   Yes [provider]  warfarin (COUMADIN) 5 MG tablet TAKE 1/2 TO 1 TABLET BY MOUTH DAILY AS DIRECTED BY COUMADIN CLINIC Patient taking differently: Take 2.5-5 mg by mouth See admin instructions. Take 2.5 mg by mouth in the morning on Sun/Tues/Wed/Thurs/Sat and 5 mg on Mon/Fri 12/03/17  Yes Troy Sine, MD  isosorbide mononitrate (IMDUR) 30 MG 24 hr tablet Take 1 tablet (30 mg total) by mouth daily. 11/12/17   Troy Sine, MD  umeclidinium-vilanterol (ANORO ELLIPTA) 62.5-25 MCG/INH AEPB Inhale 1 puff into the lungs daily. Patient not taking: Reported on 01/31/2018 12/30/17   Tanda Rockers, MD    Physical Exam: Vitals:   01/31/18 1228 01/31/18 1243 01/31/18 1300 01/31/18 1330  BP:   119/61 125/77  Pulse:   90 94  Resp: (!) 22  (!) 24  (!) 31  Temp:      TempSrc:      SpO2:  100% 100% 99%    Constitutional: Lying propped up in bed, BIPAP in place Vitals:   01/31/18 1228 01/31/18 1243 01/31/18 1300 01/31/18 1330  BP:   119/61 125/77  Pulse:   90 94  Resp: (!) 22  (!) 24 (!) 31  Temp:      TempSrc:      SpO2:  100% 100% 99%   ENMT: Mucous membranes are moist. BIPAP in placed  Neck: Supple, + JVD to line of bipap mask Respiratory: Crackles at bases, distant breath sounds, tachypneic Cardiovascular: RR, NR,  no murmur noted.  He has 2+ edema of the legs up to the knees.  Abdomen: distended, but not tense, + edema of back and lower abdomen Musculoskeletal: No clubbing or cyanosis, he has elongated toenails Skin: Some ulceration in between toes bilaterally, chronic skin changes on shins Neurologic: Grossly intact, speaking in complete sentences through bipap mask, moving extremities easily.  Psychiatric: Normal judgment and insight.   Labs on Admission: I have personally reviewed following labs and imaging studies  CBC: Recent Labs  Lab 01/31/18 1226  WBC 4.5  NEUTROABS 3.4  HGB 10.3*  HCT 36.5*  MCV 112.0*  PLT 465   Basic Metabolic Panel: Recent Labs  Lab 01/31/18 1226  NA 145  K 4.1  CL 99  CO2 36*  GLUCOSE 126*  BUN 38*  CREATININE 2.43*  CALCIUM 9.8   GFR: CrCl cannot be calculated (Unknown ideal weight.). Liver Function Tests: Recent Labs  Lab 01/31/18 1226  AST 34  ALT 18  ALKPHOS 142*  BILITOT 1.2  PROT 8.1  ALBUMIN 3.7   No results for input(s): LIPASE, AMYLASE in the last 168 hours. No results for input(s): AMMONIA in the last 168 hours. Coagulation Profile: Recent Labs  Lab 01/31/18 1341  INR 2.08   Cardiac Enzymes: No results for input(s): CKTOTAL, CKMB, CKMBINDEX, TROPONINI in the last 168 hours. BNP (last 3 results) No results for input(s): PROBNP in the last 8760 hours. HbA1C: No results for input(s): HGBA1C in the last 72 hours. CBG: No results for input(s):  GLUCAP in the last 168 hours. Lipid Profile: No results for input(s): CHOL, HDL, LDLCALC, TRIG, CHOLHDL, LDLDIRECT in the last 72 hours. Thyroid Function Tests: No results for input(s): TSH, T4TOTAL, FREET4, T3FREE, THYROIDAB in the last 72 hours. Anemia Panel: No results for input(s): VITAMINB12, FOLATE, FERRITIN, TIBC, IRON, RETICCTPCT in the last 72 hours. Urine analysis:    Component Value Date/Time   COLORURINE YELLOW 10/27/2016 0113   APPEARANCEUR CLEAR 10/27/2016 0113   LABSPEC 1.013 10/27/2016 0113   PHURINE 6.0 10/27/2016 0113   GLUCOSEU NEGATIVE 10/27/2016 0113   HGBUR NEGATIVE 10/27/2016 0113   BILIRUBINUR NEGATIVE 10/27/2016 0113   KETONESUR NEGATIVE 10/27/2016 0113   PROTEINUR 100 (A) 10/27/2016 0113   UROBILINOGEN 0.2 06/17/2012 0622   NITRITE NEGATIVE 10/27/2016 0113   LEUKOCYTESUR NEGATIVE 10/27/2016 0113    Radiological Exams on Admission: Dg Chest Portable 1 View  Result Date: 01/31/2018 CLINICAL DATA:  77 year old male with shortness of breath for the past 2-3 days EXAM: PORTABLE CHEST 1 VIEW COMPARISON:  Prior chest x-ray 01/15/2018 FINDINGS: Marked cardiomegaly which likely appears exaggerated given portable frontal technique. Mediastinal widening is similar compared to prior. Atherosclerotic calcifications again noted in the transverse aorta. Marked pulmonary vascular congestion with mild interstitial pulmonary edema. Moderately large and potentially partially loculated right pleural effusion has increased compared to prior. No evidence of pneumothorax. No acute osseous abnormality. IMPRESSION: 1. Slightly worsened pulmonary vascular congestion now with mild interstitial edema. 2. Enlarging and likely partially loculated right pleural effusion. 3. Similar degree of cardiomegaly and Aortic Atherosclerosis (ICD10-170.0) Electronically Signed   By: Jacqulynn Cadet M.D.   On: 01/31/2018 12:49    EKG: Independently reviewed. Afib with PVCs, and areas of altered  baseline  Assessment/Plan Acute on chronic diastolic (congestive) heart failure - Appears volume overloaded, on torsemide 60 mg BID (equivalent to 120mg  of lasix BID) - Family reports increased salty foods due to holidays - Increased girth, JVD and requiring bipap - Lasix IV  120mg  TID for today, evaluate urine output - Trend Cr - Strict I/Os - Daily weight - Consider trial off of bipap tonight - Continue home K dosage given high dose of diuretics - Trend troponin - Telemetry - Repeat TTE to see if systolic function has worsened    Benign essential hypertension - BP well controlled when I saw him - Continue home medications of imdur    Esophageal reflux - Continue protonix, home medications    Chronic kidney disease (CKD) stage G3b/A2 - Continue calctriol - Daily BMET while on diuretics - KCl home dose of 20 meq  Persistent atrial fibrillation - His HR is well controlled, he used to be on amiodarone, but has not been on beta blocker - Telemetry - Warfarin per pharmacy - INR today was 2    Hypothyroidism - Continue home synthroid - Consider repeat TSH if warranted, last was October and was at goal    Diabetes mellitus with renal complications  - On no meds currently  - SSI     DVT prophylaxis: Warfarin Code Status: Full Family Communication: Wife at bedside Disposition Plan: Admit for diuresis Consults called: None, can discuss with heart failure if not improving as expected Admission status: SDU given bipap, inpatient   Gilles Chiquito MD [facility] Pager [phone]  If 7PM-7AM, please contact night-coverage [email or web address] Password [facility]  01/31/2018, 5:19 PM

## 2018-01-31 NOTE — ED Triage Notes (Signed)
Pt reports SOB. Family reports he has not been feeling well for a couple of days. SPO2 noted to be 78% on arrival, improved to 96% with 2L of O2.

## 2018-01-31 NOTE — ED Notes (Addendum)
Report given to rn on 2cc jessica

## 2018-01-31 NOTE — ED Notes (Signed)
Lab results was given to Nurse Carlis Abbott.

## 2018-02-01 ENCOUNTER — Encounter (HOSPITAL_COMMUNITY): Payer: Self-pay

## 2018-02-01 LAB — GLUCOSE, CAPILLARY
Glucose-Capillary: 109 mg/dL — ABNORMAL HIGH (ref 70–99)
Glucose-Capillary: 97 mg/dL (ref 70–99)
Glucose-Capillary: 105 mg/dL — ABNORMAL HIGH (ref 70–99)
Glucose-Capillary: 135 mg/dL — ABNORMAL HIGH (ref 70–99)

## 2018-02-01 LAB — BASIC METABOLIC PANEL
Anion gap: 16 — ABNORMAL HIGH (ref 5–15)
BUN: 45 mg/dL — ABNORMAL HIGH (ref 8–23)
CO2: 29 mmol/L (ref 22–32)
Calcium: 9.3 mg/dL (ref 8.9–10.3)
Chloride: 98 mmol/L (ref 98–111)
Creatinine, Ser: 2.49 mg/dL — ABNORMAL HIGH (ref 0.61–1.24)
GFR calc Af Amer: 28 mL/min — ABNORMAL LOW (ref >60)
GFR calc non Af Amer: 24 mL/min — ABNORMAL LOW (ref >60)
Glucose, Bld: 142 mg/dL — ABNORMAL HIGH (ref 70–99)
Potassium: 4.8 mmol/L (ref 3.5–5.1)
Sodium: 143 mmol/L (ref 135–145)

## 2018-02-01 LAB — TROPONIN I
Troponin I: 0.07 ng/mL (ref <0.03)
Troponin I: 0.06 ng/mL (ref <0.03)

## 2018-02-01 LAB — PROTIME-INR
INR: 1.9
Prothrombin Time: 21.6 seconds — ABNORMAL HIGH (ref 11.4–15.2)

## 2018-02-01 MED ORDER — INFLUENZA VAC SPLIT HIGH-DOSE 0.5 ML IM SUSY
0.5000 mL | PREFILLED_SYRINGE | INTRAMUSCULAR | Status: AC
  Administered 2018-02-12: 0.5 mL via INTRAMUSCULAR
  Filled 2018-02-01 (×2): qty 0.5

## 2018-02-01 MED ORDER — WARFARIN SODIUM 4 MG PO TABS
4.0000 mg | ORAL_TABLET | Freq: Once | ORAL | Status: AC
  Administered 2018-02-01: 4 mg via ORAL
  Filled 2018-02-01: qty 1

## 2018-02-01 NOTE — Progress Notes (Signed)
Patient refusing NIV at this time, wore for an hour then ripped it off. No distress noted on [place] at this time 4L 99%.

## 2018-02-01 NOTE — Progress Notes (Signed)
ANTICOAGULATION CONSULT NOTE - Follow up [facility] for Coumadin Indication: atrial fibrillation  No Known Allergies  Patient Measurements: Height: 5\' 9"  (175.3 cm) Weight: 238 lb 12.1 oz (108.3 kg) IBW/kg (Calculated) : 70.7  Vital Signs: Temp: 97.8 F (36.6 C) (12/28 0750) Temp Source: Oral (12/28 0750) BP: 127/68 (12/28 0750) Pulse Rate: 80 (12/28 0750)  Labs: Recent Labs    01/31/18 1226 01/31/18 1341 01/31/18 2206 02/01/18 0242  HGB 10.3*  --   --   --   HCT 36.5*  --   --   --   PLT 156  --   --   --   LABPROT  --  23.1*  --  21.6*  INR  --  2.08  --  1.90  CREATININE 2.43*  --   --  2.49*  TROPONINI  --   --  0.08* 0.07*    Estimated Creatinine Clearance: 29.6 mL/min (A) (by C-G formula based on SCr of 2.49 mg/dL (H)).   Medical History: Past Medical History:  Diagnosis Date  . Acute kidney injury superimposed on chronic kidney disease ([place]) 11/2016  . Acute respiratory failure ([place]) 11/2016  . Allergy   . Asthma   . Atrial fibrillation ([place])   . 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 Benign essential hypertension   . CHF (congestive heart failure) ([place])   . CKD (chronic kidney disease), stage III ([place])   . COPD (chronic obstructive pulmonary disease) ([place])   . Diabetes ([place])   . Diabetes mellitus without complication ([place])   . Dyspnea   . Gastritis   . GERD (gastroesophageal reflux disease)   . Gout attack 09/2012  . Hypertension   . Sinusitis, maxillary, chronic   . Thyroid disease   . Type II or unspecified type diabetes mellitus without mention of complication, not stated as uncontrolled     Medications:  See electronic med rec  Assessment: 77 y.o. M presents with SOB, swelling. Pt on coumadin PTA for afib.  INR subtherapeutic at 1.90 s/p warfarin 5 mg (home dose for Friday). Hgb low at 10.3, plt wnl. No signs/symptoms of bleeding noted.  Home dose: 2.5mg  daily except for 5mg  on Mon/Fri  Goal of  Therapy:  INR 2-3 Monitor platelets by anticoagulation protocol: Yes   Plan:  Warfarin 4 mg x1 Daily INR/CBC   Claiborne Billings, PharmD PGY2 Cardiology Pharmacy Resident Phone [phone] Please check AMION for all Pharmacist numbers by unit 02/01/2018 10:25 AM

## 2018-02-01 NOTE — Evaluation (Signed)
Physical Therapy Evaluation Patient Details Name: Omar Lambert MRN: [id number] DOB: 1941-01-23 Today's Date: 02/01/2018   History of Present Illness  Omar Lambert Wilsonis a 77 y.o.malewith medical history significant ofDM2, Hypothyroidism, HTN, Gout, GERD, COPD, CKD, diastolic CHF, asthma who presents for SOB and swelling. Pt admitted for acute on chronic diastolic heart failure.   Clinical Impression  Orders received for PT evaluation. Patient demonstrates deficits in functional mobility as indicated below. Will benefit from continued skilled PT to address deficits and maximize function. Will see as indicated and progress as tolerated.   Ambulated on 2 liters with desaturation to 87%, increased to 3 liters with saturations to 90%, 3 liters at rest 96%    Follow Up Recommendations Home health PT    Equipment Recommendations  None recommended by PT    Recommendations for Other Services       Precautions / Restrictions Precautions Precaution Comments: watch 02 saturations Restrictions Weight Bearing Restrictions: No      Mobility  Bed Mobility Overal bed mobility: Independent             General bed mobility comments: no physical assist, good time to perform  Transfers Overall transfer level: Modified independent Equipment used: None             General transfer comment: no physical assist, increased time to power up to standing, performed x3  Ambulation/Gait Ambulation/Gait assistance: Supervision Gait Distance (Feet): 210 Feet Assistive device: None Gait Pattern/deviations: Step-through pattern;Decreased stride length;Drifts right/left Gait velocity: decreased   General Gait Details: patient with modest instbaility during ambulation. Ambulated while maneuvering O2 tank, supervision to help guide tank away from walls  Stairs            Wheelchair Mobility    Modified Rankin (Stroke Patients Only)       Balance Overall balance assessment:  Mild deficits observed, not formally tested;History of Falls                                           Pertinent Vitals/Pain Pain Assessment: No/denies pain    Home Living Family/patient expects to be discharged to:: Private residence Living Arrangements: Spouse/significant other Available Help at Discharge: Family;Available 24 hours/day(Part time job for wife in evenings) Type of Home: House Home Access: Level entry     Home Layout: One [place]: [place] - single point;Walker - 2 wheels;Bedside commode Additional Comments: patient reports having a cane that he sometimes carries but does not use for walking    Prior Function Level of Independence: Independent               Hand Dominance   Dominant Hand: Right    Extremity/Trunk Assessment   Upper Extremity Assessment Upper Extremity Assessment: Overall WFL for tasks assessed    Lower Extremity Assessment Lower Extremity Assessment: Overall WFL for tasks assessed       Communication   Communication: No difficulties  Cognition Arousal/Alertness: Awake/alert Behavior During Therapy: WFL for tasks assessed/performed Overall Cognitive Status: History of cognitive impairments - at baseline                                        General Comments      Exercises     Assessment/Plan    PT Assessment  Patient needs continued PT services  PT Problem List Decreased activity tolerance;Decreased balance;Decreased mobility;Cardiopulmonary status limiting activity       PT Treatment Interventions DME instruction;Gait training;Therapeutic exercise;Therapeutic activities;Functional mobility training;Balance training;Patient/family education    PT Goals (Current goals can be found in the Care Plan section)  Acute Rehab PT Goals Patient Stated Goal: to go home PT Goal Formulation: With patient/family Time For Goal Achievement: 02/15/18 Potential to Achieve Goals: Good     Frequency Min 2X/week   Barriers to discharge        Co-evaluation               AM-PAC PT "6 Clicks" Mobility  Outcome Measure Help needed turning from your back to your side while in a flat bed without using bedrails?: None Help needed moving from lying on your back to sitting on the side of a flat bed without using bedrails?: None Help needed moving to and from a bed to a chair (including a wheelchair)?: A Little Help needed standing up from a chair using your arms (e.g., wheelchair or bedside chair)?: A Little Help needed to walk in hospital room?: A Little Help needed climbing 3-5 steps with a railing? : A Lot 6 Click Score: 19    End of Session Equipment Utilized During Treatment: Gait belt;Oxygen Activity Tolerance: Patient limited by fatigue Patient left: in bed;with call bell/phone within reach;with family/visitor present Nurse Communication: Mobility status PT Visit Diagnosis: Difficulty in walking, not elsewhere classified (R26.2)    Time: 4142-3953 PT Time Calculation (min) (ACUTE ONLY): 18 min   Charges:   PT Evaluation $PT Eval Moderate Complexity: 1 Mod          Alben Deeds, PT DPT  Board Certified Neurologic Specialist Acute Rehabilitation Services Pager [phone] Office [phone]   Duncan Dull 02/01/2018, 11:09 AM

## 2018-02-01 NOTE — Progress Notes (Signed)
CRITICAL VALUE ALERT  Critical Value:  Troponin 0.08  Date & Time Notied:  01/31/2018 2315  Provider Notified: Kennon Holter  Orders Received/Actions taken: Provider aware

## 2018-02-01 NOTE — Progress Notes (Signed)
PROGRESS NOTE    Omar Lambert  ONG:[id number] DOB: 22-Oct-1940 DOA: 01/31/2018 PCP: Gayland Curry, DO    Brief Narrative: Omar Lambert is a 77 y.o. male with medical history significant of DM2, Hypothyroidism, HTN, Gout, GERD, COPD, CKD, diastolic CHF, asthma who presents for SOB and swelling. Pt admitted for acute on chronic diastolic heart failure.    Assessment & Plan:   Active Problems:   Benign essential hypertension   Esophageal reflux   Chronic kidney disease (CKD) stage G3b/A2, moderately decreased glomerular filtration rate (GFR) between 30-44 mL/min/1.73 square meter and albuminuria creatinine ratio between 30-299 mg/g (HCC)   Persistent atrial fibrillation   Hypothyroidism   Diabetes mellitus with renal complications (HCC)   Acute on chronic diastolic (congestive) heart failure (HCC)   Acute on chronic diastolic heart failure Admitted to stepdown for BiPAP use last night. Probably secondary to noncompliance to fluid and salt restriction. Patient continues to be fluid overloaded requiring IV Lasix 120 mg every 8 hours Continue with strict intake and output and daily weights and monitor renal parameters while on IV Lasix.  Continue to watch her electrolytes while on IV diuresis.  Repeat echocardiogram to watch for new wall abnormalities and left ventricular systolic function. Get cardiology consult in the morning. Use BiPAP as needed to keep sats greater than 90%. Repeat chest x-ray in the morning to evaluate for resolution of interstitial edema.   Elevated troponins probably from demand ischemia from acute CHF.    Essential hypertension Well-controlled Continue home medication    Stage III chronic kidney disease Watch renal parameters while on IV diuretics    Persistent atrial fibrillation Rate controlled On Coumadin for anticoagulation. Dose Coumadin as per pharmacy.   Type 2 diabetes mellitus with renal complications  CBG (last 3)  Recent  Labs    01/31/18 1952 01/31/18 2124 02/01/18 0748  GLUCAP 142* 139* 109*   Better controlled, continue with sliding scale insulin.    Hyperlipidemia Continue with Crestor    DVT prophylaxis: coumadin.  Code Status: full code.  Family Communication: none at bedside.  Disposition Plan:pending clinical improvement.   Consultants:   None.    Procedures: Echocardiogram.      Antimicrobials: none.    Subjective: Breathing has improved. butpt not back to baseline, continues to be tachypneic.   Objective: Vitals:   02/01/18 0300 02/01/18 0344 02/01/18 0559 02/01/18 0750  BP:  (!) 152/79  127/68  Pulse:  93  80  Resp:  (!) 24  (!) 26  Temp: 98.4 F (36.9 C) 97.8 F (36.6 C)  97.8 F (36.6 C)  TempSrc: Oral Oral  Oral  SpO2:  95%  95%  Weight:   108.3 kg   Height:        Intake/Output Summary (Last 24 hours) at 02/01/2018 1003 Last data filed at 02/01/2018 0851 Gross per 24 hour  Intake 820.12 ml  Output 2050 ml  Net -1229.88 ml   Filed Weights   01/31/18 2133 02/01/18 0559  Weight: 107.5 kg 108.3 kg    Examination:  General exam: in mild distress from sob. On 3 lit on [place] oxygen.  Respiratory system: bilateral rales, tachypnea present. Diminished at bases.  Cardiovascular system: S1 & S2 heard, irregular, JVD present.  Gastrointestinal system: Abdomen is nondistended, soft and nontender. No organomegaly or masses felt. Normal bowel sounds heard. Central nervous system: Alert and oriented.  Able to answer questions appropriately, grossly nonfocal Extremities:able to move all extremities.  Plus pedal  edema present Skin: No rashes, lesions or ulcers Psychiatry:  Mood & affect appropriate.     Data Reviewed: I have personally reviewed following labs and imaging studies  CBC: Recent Labs  Lab 01/31/18 1226  WBC 4.5  NEUTROABS 3.4  HGB 10.3*  HCT 36.5*  MCV 112.0*  PLT 314   Basic Metabolic Panel: Recent Labs  Lab 01/31/18 1226  02/01/18 0242  NA 145 143  K 4.1 4.8  CL 99 98  CO2 36* 29  GLUCOSE 126* 142*  BUN 38* 45*  CREATININE 2.43* 2.49*  CALCIUM 9.8 9.3   GFR: Estimated Creatinine Clearance: 29.6 mL/min (A) (by C-G formula based on SCr of 2.49 mg/dL (H)). Liver Function Tests: Recent Labs  Lab 01/31/18 1226  AST 34  ALT 18  ALKPHOS 142*  BILITOT 1.2  PROT 8.1  ALBUMIN 3.7   No results for input(s): LIPASE, AMYLASE in the last 168 hours. No results for input(s): AMMONIA in the last 168 hours. Coagulation Profile: Recent Labs  Lab 01/31/18 1341 02/01/18 0242  INR 2.08 1.90   Cardiac Enzymes: Recent Labs  Lab 01/31/18 2206 02/01/18 0242  TROPONINI 0.08* 0.07*   BNP (last 3 results) No results for input(s): PROBNP in the last 8760 hours. HbA1C: No results for input(s): HGBA1C in the last 72 hours. CBG: Recent Labs  Lab 01/31/18 1952 01/31/18 2124 02/01/18 0748  GLUCAP 142* 139* 109*   Lipid Profile: No results for input(s): CHOL, HDL, LDLCALC, TRIG, CHOLHDL, LDLDIRECT in the last 72 hours. Thyroid Function Tests: No results for input(s): TSH, T4TOTAL, FREET4, T3FREE, THYROIDAB in the last 72 hours. Anemia Panel: No results for input(s): VITAMINB12, FOLATE, FERRITIN, TIBC, IRON, RETICCTPCT in the last 72 hours. Sepsis Labs: Recent Labs  Lab 01/31/18 1235 01/31/18 1452  LATICACIDVEN 3.40* 2.01*    Recent Results (from the past 240 hour(s))  Blood culture (routine x 2)     Status: None (Preliminary result)   Collection Time: 01/31/18 12:28 PM  Result Value Ref Range Status   Specimen Description BLOOD RIGHT ANTECUBITAL  Final   Special Requests   Final    BOTTLES DRAWN AEROBIC AND ANAEROBIC Blood Culture adequate volume   Culture   Final    NO GROWTH < 24 HOURS Performed at [facility], [place] [address] [place], [place] [address]    Report Status PENDING  Incomplete  Blood culture (routine x 2)     Status: None (Preliminary result)   Collection Time: 01/31/18   1:41 PM  Result Value Ref Range Status   Specimen Description BLOOD LEFT ANTECUBITAL  Final   Special Requests   Final    BOTTLES DRAWN AEROBIC AND ANAEROBIC Blood Culture adequate volume   Culture   Final    NO GROWTH < 24 HOURS Performed at [facility], [place] [address] [place], [place] [address]    Report Status PENDING  Incomplete  MRSA PCR Screening     Status: None   Collection Time: 01/31/18  9:38 PM  Result Value Ref Range Status   MRSA by PCR NEGATIVE NEGATIVE Final    Comment:        The GeneXpert MRSA Assay (FDA approved for NASAL specimens only), is one component of a comprehensive MRSA colonization surveillance program. It is not intended to diagnose MRSA infection nor to guide or monitor treatment for MRSA infections. Performed at [facility], [place] [address] [place], [place] [address]  Radiology Studies: Dg Chest Portable 1 View  Result Date: 01/31/2018 CLINICAL DATA:  77 year old male with shortness of breath for the past 2-3 days EXAM: PORTABLE CHEST 1 VIEW COMPARISON:  Prior chest x-ray 01/15/2018 FINDINGS: Marked cardiomegaly which likely appears exaggerated given portable frontal technique. Mediastinal widening is similar compared to prior. Atherosclerotic calcifications again noted in the transverse aorta. Marked pulmonary vascular congestion with mild interstitial pulmonary edema. Moderately large and potentially partially loculated right pleural effusion has increased compared to prior. No evidence of pneumothorax. No acute osseous abnormality. IMPRESSION: 1. Slightly worsened pulmonary vascular congestion now with mild interstitial edema. 2. Enlarging and likely partially loculated right pleural effusion. 3. Similar degree of cardiomegaly and Aortic Atherosclerosis (ICD10-170.0) Electronically Signed   By: Jacqulynn Cadet M.D.   On: 01/31/2018 12:49        Scheduled Meds: . allopurinol  300 mg Oral Daily  . calcitRIOL  0.25  mcg Oral QODAY  . calcitRIOL  0.5 mcg Oral QODAY  . [START ON 02/02/2018] Influenza vac split quadrivalent PF  0.5 mL Intramuscular Tomorrow-1000  . isosorbide mononitrate  30 mg Oral Daily  . levothyroxine  175 mcg Oral QAC breakfast  . multivitamin with minerals  1 tablet Oral Daily  . pantoprazole  40 mg Oral Daily  . potassium chloride SA  20 mEq Oral Daily  . rosuvastatin  20 mg Oral Daily  . sodium chloride flush  3 mL Intravenous Q12H  . umeclidinium-vilanterol  1 puff Inhalation Daily  . warfarin  2.5 mg Oral Once per day on Sun Tue Wed Thu Sat  . warfarin  5 mg Oral Once per day on Mon Fri  . Warfarin - Pharmacist Dosing Inpatient   Does not apply q1800   Continuous Infusions: . sodium chloride    . furosemide 120 mg (02/01/18 0559)     LOS: 1 day    Time spent: 42 minutes.     Hosie Poisson, MD [facility] Pager [phone]  If 7PM-7AM, please contact night-coverage [email or web address] Password [email or web address] 02/01/2018, 10:03 AM

## 2018-02-02 ENCOUNTER — Inpatient Hospital Stay (HOSPITAL_COMMUNITY): Payer: Medicare HMO

## 2018-02-02 DIAGNOSIS — I361 Nonrheumatic tricuspid (valve) insufficiency: Secondary | ICD-10-CM

## 2018-02-02 DIAGNOSIS — I370 Nonrheumatic pulmonary valve stenosis: Secondary | ICD-10-CM

## 2018-02-02 LAB — GLUCOSE, CAPILLARY
GLUCOSE-CAPILLARY: 93 mg/dL (ref 70–99)
GLUCOSE-CAPILLARY: 104 mg/dL — AB (ref 70–99)
Glucose-Capillary: 95 mg/dL (ref 70–99)
Glucose-Capillary: 110 mg/dL — ABNORMAL HIGH (ref 70–99)

## 2018-02-02 LAB — PROTIME-INR
INR: 2.1
Prothrombin Time: 23.3 seconds — ABNORMAL HIGH (ref 11.4–15.2)

## 2018-02-02 LAB — ECHOCARDIOGRAM COMPLETE
Height: 69 in
Weight: 3823.66 oz

## 2018-02-02 LAB — CBC
HCT: 35.2 % — ABNORMAL LOW (ref 39.0–52.0)
Hemoglobin: 10.2 g/dL — ABNORMAL LOW (ref 13.0–17.0)
MCH: 31 pg (ref 26.0–34.0)
MCHC: 29 g/dL — ABNORMAL LOW (ref 30.0–36.0)
MCV: 107 fL — ABNORMAL HIGH (ref 80.0–100.0)
Platelets: 149 10*3/uL — ABNORMAL LOW (ref 150–400)
RBC: 3.29 MIL/uL — ABNORMAL LOW (ref 4.22–5.81)
RDW: 17.2 % — ABNORMAL HIGH (ref 11.5–15.5)
WBC: 7.3 10*3/uL (ref 4.0–10.5)
nRBC: 0 % (ref 0.0–0.2)

## 2018-02-02 LAB — BASIC METABOLIC PANEL
Anion gap: 12 (ref 5–15)
BUN: 59 mg/dL — ABNORMAL HIGH (ref 8–23)
CO2: 34 mmol/L — ABNORMAL HIGH (ref 22–32)
Calcium: 9.3 mg/dL (ref 8.9–10.3)
Chloride: 95 mmol/L — ABNORMAL LOW (ref 98–111)
Creatinine, Ser: 2.58 mg/dL — ABNORMAL HIGH (ref 0.61–1.24)
GFR calc Af Amer: 26 mL/min — ABNORMAL LOW (ref >60)
GFR calc non Af Amer: 23 mL/min — ABNORMAL LOW (ref >60)
Glucose, Bld: 97 mg/dL (ref 70–99)
Potassium: 4.5 mmol/L (ref 3.5–5.1)
SODIUM: 141 mmol/L (ref 135–145)

## 2018-02-02 MED ORDER — PERFLUTREN LIPID MICROSPHERE
INTRAVENOUS | Status: AC
  Administered 2018-02-02: 2 mL via INTRAVENOUS
  Filled 2018-02-02: qty 10

## 2018-02-02 MED ORDER — PERFLUTREN LIPID MICROSPHERE
1.0000 mL | INTRAVENOUS | Status: AC | PRN
  Administered 2018-02-02: 2 mL via INTRAVENOUS
  Filled 2018-02-02: qty 10

## 2018-02-02 MED ORDER — WARFARIN SODIUM 4 MG PO TABS
4.0000 mg | ORAL_TABLET | Freq: Once | ORAL | Status: AC
  Administered 2018-02-02: 4 mg via ORAL
  Filled 2018-02-02: qty 1

## 2018-02-02 MED ORDER — ORAL CARE MOUTH RINSE
15.0000 mL | Freq: Two times a day (BID) | OROMUCOSAL | Status: DC
  Administered 2018-02-02 – 2018-02-16 (×21): 15 mL via OROMUCOSAL

## 2018-02-02 NOTE — Care Management Note (Addendum)
Case Management Note  Patient Details  Name: MANPREET STREY MRN: [id number] Date of Birth: [birth year]  Subjective/Objective:      Spoke with patient regarding services at home. Patient was not able to recall agencies' names.  Spoke with wife over the phone.  Ms. Vanterpool states they have a RN to help with PT draws and medications from [facility].  They had PT until a few weeks ago and she and patient would like to resume PT.    Pt is on home O2 at 2lpm. Oxygen is through [facility].  Pt has a stationary concentrator and small portable tanks.  Pt's wife states tanks are too heavy and only last 2 hours.  She states they are in the process of getting a portable concentrator.        Wife states patient does not need RW or 3n1.  They have 3n1.  Wife requests a tub bench.  Advised tub bench would not be covered by Medicare and they may use 3n1 in shower.  Wife states they will use 3n1 for bathing. No other DME needed.   Action/Plan: Medicare [place] list left on chart and copy left in patient's room.  Pt's wife states they do not want to change [facility] agencies.    CM will continue to follow.   Expected Discharge Date:                  Expected Discharge Plan:  [place]  In-House Referral:  NA  Discharge planning Services  CM Consult  Post Acute Care Choice:  Home Health, Durable Medical Equipment Choice offered to:  Patient, Spouse  DME Arranged:    DME Agency:     HH Arranged:    Hospers:  [facility] (formerly [employer])  Status of Service:  In process, will continue to follow  If discussed at Long Length of Stay Meetings, dates discussed:    Additional Comments:  Claudie Leach, RN 02/02/2018, 5:27 PM

## 2018-02-02 NOTE — Progress Notes (Signed)
ANTICOAGULATION CONSULT NOTE - Follow up [facility] for Coumadin Indication: atrial fibrillation  No Known Allergies  Patient Measurements: Height: 5\' 9"  (175.3 cm) Weight: 238 lb 15.7 oz (108.4 kg) IBW/kg (Calculated) : 70.7  Vital Signs: Temp: 95.7 F (35.4 C) (12/29 1133) Temp Source: Oral (12/29 1133) BP: 130/80 (12/29 1133) Pulse Rate: 79 (12/29 1133)  Labs: Recent Labs    01/31/18 1226 01/31/18 1341 01/31/18 2206 02/01/18 0242 02/01/18 0904 02/02/18 0335  HGB 10.3*  --   --   --   --  10.2*  HCT 36.5*  --   --   --   --  35.2*  PLT 156  --   --   --   --  149*  LABPROT  --  23.1*  --  21.6*  --  23.3*  INR  --  2.08  --  1.90  --  2.10  CREATININE 2.43*  --   --  2.49*  --  2.58*  TROPONINI  --   --  0.08* 0.07* 0.06*  --     Estimated Creatinine Clearance: 28.6 mL/min (A) (by C-G formula based on SCr of 2.58 mg/dL (H)).   Medical History: Past Medical History:  Diagnosis Date  . Acute kidney injury superimposed on chronic kidney disease ([place]) 11/2016  . Acute respiratory failure ([place]) 11/2016  . Allergy   . Asthma   . Atrial fibrillation ([place])   . 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 Benign essential hypertension   . CHF (congestive heart failure) ([place])   . CKD (chronic kidney disease), stage III ([place])   . COPD (chronic obstructive pulmonary disease) ([place])   . Diabetes ([place])   . Diabetes mellitus without complication ([place])   . Dyspnea   . Gastritis   . GERD (gastroesophageal reflux disease)   . Gout attack 09/2012  . Hypertension   . Sinusitis, maxillary, chronic   . Thyroid disease   . Type II or unspecified type diabetes mellitus without mention of complication, not stated as uncontrolled     Assessment: 77 y.o. M presents with SOB, swelling. Pt on coumadin PTA for afib.  INR therapeutic at 2.1 s/p warfarin 4 mg. Hgb low at 10.2, stable, plt stable. No signs/symptoms of bleeding noted.  Home dose: 2.5mg   daily except for 5mg  on Mon/Fri  Goal of Therapy:  INR 2-3 Monitor platelets by anticoagulation protocol: Yes   Plan:  Warfarin 4 mg x1 Daily INR/CBC   Claiborne Billings, PharmD PGY2 Cardiology Pharmacy Resident Phone [phone] Please check AMION for all Pharmacist numbers by unit 02/02/2018 2:04 PM

## 2018-02-02 NOTE — Progress Notes (Signed)
  Echocardiogram 2D Echocardiogram has been performed.  Omar Lambert M 02/02/2018, 12:07 PM

## 2018-02-02 NOTE — Progress Notes (Signed)
Pt declines use of BiPAP.  Pt is resting comfortably on 2LNC. HR-84, RR-24, SpO2-94.  RT will continue to monitor and will place if needed or requested.

## 2018-02-02 NOTE — Plan of Care (Signed)
  Problem: Elimination: Goal: Will not experience complications related to bowel motility Outcome: Progressing Goal: Will not experience complications related to urinary retention Outcome: Progressing   Problem: Pain Managment: Goal: General experience of comfort will improve Outcome: Progressing   Problem: Cardiac: Goal: Ability to achieve and maintain adequate cardiopulmonary perfusion will improve Outcome: Progressing

## 2018-02-02 NOTE — Progress Notes (Signed)
PROGRESS NOTE    Omar Lambert  XHB:[id number] DOB: [birth year] DOA: 01/31/2018 PCP: Gayland Curry, DO    Brief Narrative: Omar Lambert is a 77 y.o. male with medical history significant of DM2, Hypothyroidism, HTN, Gout, GERD, COPD, CKD, diastolic CHF, asthma who presents for SOB and swelling. Pt admitted for acute on chronic diastolic heart failure.    Assessment & Plan:   Active Problems:   Benign essential hypertension   Esophageal reflux   Chronic kidney disease (CKD) stage G3b/A2, moderately decreased glomerular filtration rate (GFR) between 30-44 mL/min/1.73 square meter and albuminuria creatinine ratio between 30-299 mg/g (HCC)   Persistent atrial fibrillation   Hypothyroidism   Diabetes mellitus with renal complications (HCC)   Acute on chronic diastolic (congestive) heart failure (HCC)   Acute on chronic diastolic heart failure Admitted to stepdown .  Probably secondary to noncompliance to fluid and salt restriction. Patient continues to be fluid overloaded requiring IV Lasix 120 mg every 8 hours Continue with strict intake and output and daily weights and monitor renal parameters while on IV Lasix.    Repeat echocardiogram showed Wall thickness was   increased in a pattern of severe LVH. Systolic function was normal. The estimated ejection fraction was in the range of 50%  to 55%. Wall motion was normal; there were no regional wall  motion abnormalities. The study was not technically sufficient to   allow evaluation of LV diastolic dysfunction due to atrial fibrillation. Cardiology consulted to see the patient in the morning. He will probably need RHC for further evaluation.  Use BiPAP as needed to keep sats greater than 90%. Repeat chest x-ray pending.    Elevated troponins probably from demand ischemia from acute CHF.    Essential hypertension Well-controlled Continue home medication    Stage III chronic kidney disease Watch renal parameters while on IV  diuretics. Creatinine at 2.58. it appears to be at baseline.     Persistent atrial fibrillation Rate controlled On Coumadin for anticoagulation. Dose Coumadin as per pharmacy. Therapeutic INR.    Type 2 diabetes mellitus with renal complications  CBG (last 3)  Recent Labs    02/01/18 2203 02/02/18 0759 02/02/18 1132  GLUCAP 135* 95 93   WELL controlled, continue with sliding scale insulin.    Hyperlipidemia Continue with Crestor    DVT prophylaxis: coumadin.  Code Status: full code.  Family Communication: wife at bedside.  Disposition Plan:pending clinical improvement.   Consultants:   None.    Procedures: Echocardiogram.      Antimicrobials: none.    Subjective: Not much improvement since yesterday.  Continues to have DOE and significant pedal edema.   Objective: Vitals:   02/02/18 0754 02/02/18 0801 02/02/18 1133 02/02/18 1200  BP:  126/74 130/80   Pulse:  84 79 81  Resp:  (!) 25 (!) 25 (!) 25  Temp:  97.8 F (36.6 C) (!) 95.7 F (35.4 C)   TempSrc:  Oral Oral   SpO2: 95% 99% 100% 95%  Weight:      Height:        Intake/Output Summary (Last 24 hours) at 02/02/2018 1522 Last data filed at 02/02/2018 1100 Gross per 24 hour  Intake 988.84 ml  Output 2350 ml  Net -1361.16 ml   Filed Weights   01/31/18 2133 02/01/18 0559 02/02/18 0537  Weight: 107.5 kg 108.3 kg 108.4 kg    Examination:  General exam: in mild distress from sob. On 3 lit on  oxygen.  Respiratory system: bilateral rales, tachypnea present. Diminished at bases.  Cardiovascular system: S1 & S2 heard, irregular, JVD present.  Gastrointestinal system: Abdomen is soft NT ND BS+ Central nervous system: Alert and oriented.  Able to answer questions appropriately, grossly nonfocal Extremities:able to move all extremities.  3+ pedal edema present.  Skin: No rashes, lesions or ulcers Psychiatry:  Mood & affect appropriate.     Data Reviewed: I have personally reviewed  following labs and imaging studies  CBC: Recent Labs  Lab 01/31/18 1226 02/02/18 0335  WBC 4.5 7.3  NEUTROABS 3.4  --   HGB 10.3* 10.2*  HCT 36.5* 35.2*  MCV 112.0* 107.0*  PLT 156 034*   Basic Metabolic Panel: Recent Labs  Lab 01/31/18 1226 02/01/18 0242 02/02/18 0335  NA 145 143 141  K 4.1 4.8 4.5  CL 99 98 95*  CO2 36* 29 34*  GLUCOSE 126* 142* 97  BUN 38* 45* 59*  CREATININE 2.43* 2.49* 2.58*  CALCIUM 9.8 9.3 9.3   GFR: Estimated Creatinine Clearance: 28.6 mL/min (A) (by C-G formula based on SCr of 2.58 mg/dL (H)). Liver Function Tests: Recent Labs  Lab 01/31/18 1226  AST 34  ALT 18  ALKPHOS 142*  BILITOT 1.2  PROT 8.1  ALBUMIN 3.7   No results for input(s): LIPASE, AMYLASE in the last 168 hours. No results for input(s): AMMONIA in the last 168 hours. Coagulation Profile: Recent Labs  Lab 01/31/18 1341 02/01/18 0242 02/02/18 0335  INR 2.08 1.90 2.10   Cardiac Enzymes: Recent Labs  Lab 01/31/18 2206 02/01/18 0242 02/01/18 0904  TROPONINI 0.08* 0.07* 0.06*   BNP (last 3 results) No results for input(s): PROBNP in the last 8760 hours. HbA1C: No results for input(s): HGBA1C in the last 72 hours. CBG: Recent Labs  Lab 02/01/18 1146 02/01/18 1654 02/01/18 2203 02/02/18 0759 02/02/18 1132  GLUCAP 97 105* 135* 95 93   Lipid Profile: No results for input(s): CHOL, HDL, LDLCALC, TRIG, CHOLHDL, LDLDIRECT in the last 72 hours. Thyroid Function Tests: No results for input(s): TSH, T4TOTAL, FREET4, T3FREE, THYROIDAB in the last 72 hours. Anemia Panel: No results for input(s): VITAMINB12, FOLATE, FERRITIN, TIBC, IRON, RETICCTPCT in the last 72 hours. Sepsis Labs: Recent Labs  Lab 01/31/18 1235 01/31/18 1452  LATICACIDVEN 3.40* 2.01*    Recent Results (from the past 240 hour(s))  Blood culture (routine x 2)     Status: None (Preliminary result)   Collection Time: 01/31/18 12:28 PM  Result Value Ref Range Status   Specimen Description  BLOOD RIGHT ANTECUBITAL  Final   Special Requests   Final    BOTTLES DRAWN AEROBIC AND ANAEROBIC Blood Culture adequate volume   Culture   Final    NO GROWTH 2 DAYS Performed at [facility], [place] [address], [place], [place] [address]    Report Status PENDING  Incomplete  Blood culture (routine x 2)     Status: None (Preliminary result)   Collection Time: 01/31/18  1:41 PM  Result Value Ref Range Status   Specimen Description BLOOD LEFT ANTECUBITAL  Final   Special Requests   Final    BOTTLES DRAWN AEROBIC AND ANAEROBIC Blood Culture adequate volume   Culture   Final    NO GROWTH 2 DAYS Performed at [facility], [place] [address], [place], [place] [address]    Report Status PENDING  Incomplete  MRSA PCR Screening     Status: None   Collection Time: 01/31/18  9:38  PM  Result Value Ref Range Status   MRSA by PCR NEGATIVE NEGATIVE Final    Comment:        The GeneXpert MRSA Assay (FDA approved for NASAL specimens only), is one component of a comprehensive MRSA colonization surveillance program. It is not intended to diagnose MRSA infection nor to guide or monitor treatment for MRSA infections. Performed at [facility], [place] [address] [place], [place] [address]          Radiology Studies: No results found.      Scheduled Meds: . allopurinol  300 mg Oral Daily  . calcitRIOL  0.25 mcg Oral QODAY  . calcitRIOL  0.5 mcg Oral QODAY  . Influenza vac split quadrivalent PF  0.5 mL Intramuscular Tomorrow-1000  . isosorbide mononitrate  30 mg Oral Daily  . levothyroxine  175 mcg Oral QAC breakfast  . multivitamin with minerals  1 tablet Oral Daily  . pantoprazole  40 mg Oral Daily  . potassium chloride SA  20 mEq Oral Daily  . rosuvastatin  20 mg Oral Daily  . sodium chloride flush  3 mL Intravenous Q12H  . umeclidinium-vilanterol  1 puff Inhalation Daily  . warfarin  4 mg Oral ONCE-1800  . Warfarin - Pharmacist Dosing Inpatient   Does not apply q1800    Continuous Infusions: . sodium chloride    . furosemide 120 mg (02/02/18 1410)     LOS: 2 days    Time spent:35  minutes.     Hosie Poisson, MD [facility] Pager [phone]  If 7PM-7AM, please contact night-coverage [email or web address] Password [email or web address] 02/02/2018, 3:22 PM

## 2018-02-03 ENCOUNTER — Other Ambulatory Visit: Payer: Self-pay

## 2018-02-03 DIAGNOSIS — I5033 Acute on chronic diastolic (congestive) heart failure: Secondary | ICD-10-CM

## 2018-02-03 DIAGNOSIS — R600 Localized edema: Secondary | ICD-10-CM

## 2018-02-03 DIAGNOSIS — J449 Chronic obstructive pulmonary disease, unspecified: Secondary | ICD-10-CM

## 2018-02-03 DIAGNOSIS — N183 Chronic kidney disease, stage 3 (moderate): Secondary | ICD-10-CM

## 2018-02-03 DIAGNOSIS — I10 Essential (primary) hypertension: Secondary | ICD-10-CM

## 2018-02-03 DIAGNOSIS — I4819 Other persistent atrial fibrillation: Secondary | ICD-10-CM

## 2018-02-03 LAB — PROTIME-INR
INR: 2.13
Prothrombin Time: 23.6 seconds — ABNORMAL HIGH (ref 11.4–15.2)

## 2018-02-03 LAB — BASIC METABOLIC PANEL
Anion gap: 11 (ref 5–15)
BUN: 61 mg/dL — ABNORMAL HIGH (ref 8–23)
CO2: 38 mmol/L — ABNORMAL HIGH (ref 22–32)
Calcium: 9.5 mg/dL (ref 8.9–10.3)
Chloride: 96 mmol/L — ABNORMAL LOW (ref 98–111)
Creatinine, Ser: 2.45 mg/dL — ABNORMAL HIGH (ref 0.61–1.24)
GFR calc Af Amer: 28 mL/min — ABNORMAL LOW (ref >60)
GFR, EST NON AFRICAN AMERICAN: 24 mL/min — AB (ref >60)
GLUCOSE: 104 mg/dL — AB (ref 70–99)
Potassium: 4 mmol/L (ref 3.5–5.1)
Sodium: 145 mmol/L (ref 135–145)

## 2018-02-03 LAB — CBC
HCT: 35.2 % — ABNORMAL LOW (ref 39.0–52.0)
Hemoglobin: 10.5 g/dL — ABNORMAL LOW (ref 13.0–17.0)
MCH: 32.3 pg (ref 26.0–34.0)
MCHC: 29.8 g/dL — ABNORMAL LOW (ref 30.0–36.0)
MCV: 108.3 fL — AB (ref 80.0–100.0)
Platelets: 158 10*3/uL (ref 150–400)
RBC: 3.25 MIL/uL — ABNORMAL LOW (ref 4.22–5.81)
RDW: 17.2 % — ABNORMAL HIGH (ref 11.5–15.5)
WBC: 5.2 10*3/uL (ref 4.0–10.5)
nRBC: 0 % (ref 0.0–0.2)

## 2018-02-03 LAB — GLUCOSE, CAPILLARY
Glucose-Capillary: 107 mg/dL — ABNORMAL HIGH (ref 70–99)
Glucose-Capillary: 109 mg/dL — ABNORMAL HIGH (ref 70–99)
Glucose-Capillary: 91 mg/dL (ref 70–99)

## 2018-02-03 MED ORDER — ALBUTEROL SULFATE (2.5 MG/3ML) 0.083% IN NEBU: 2.5000 mg | INHALATION_SOLUTION | RESPIRATORY_TRACT | Status: DC | PRN

## 2018-02-03 MED ORDER — TORSEMIDE 20 MG PO TABS: 60.0000 mg | ORAL_TABLET | Freq: Every day | ORAL | Status: DC

## 2018-02-03 MED ORDER — WARFARIN SODIUM 5 MG PO TABS
5.0000 mg | ORAL_TABLET | Freq: Once | ORAL | Status: AC
  Administered 2018-02-03: 5 mg via ORAL
  Filled 2018-02-03: qty 1

## 2018-02-03 MED ORDER — DOCUSATE SODIUM 100 MG PO CAPS: 100.0000 mg | ORAL_CAPSULE | Freq: Two times a day (BID) | ORAL | Status: DC

## 2018-02-03 MED ORDER — DOCUSATE SODIUM 100 MG PO CAPS
200.0000 mg | ORAL_CAPSULE | Freq: Once | ORAL | Status: AC
  Administered 2018-02-03: 200 mg via ORAL
  Filled 2018-02-03: qty 2

## 2018-02-03 MED ORDER — TORSEMIDE 20 MG PO TABS: 60.0000 mg | ORAL_TABLET | ORAL | Status: DC

## 2018-02-03 MED ORDER — BISOPROLOL FUMARATE 5 MG PO TABS
5.0000 mg | ORAL_TABLET | Freq: Every day | ORAL | Status: DC
  Administered 2018-02-03 – 2018-02-16 (×14): 5 mg via ORAL
  Filled 2018-02-03 (×14): qty 1

## 2018-02-03 MED ORDER — TORSEMIDE 20 MG PO TABS: 60.0000 mg | ORAL_TABLET | Freq: Two times a day (BID) | ORAL | Status: DC

## 2018-02-03 NOTE — Patient Outreach (Signed)
[facility]) Care Management  02/03/2018  Omar Lambert 07-29-1940 [id number]   Member scheduled today for [facility] monthly follow up call; however, member admitted to [facility] Friday evening 01-31-2018. Hospital Liaisons made aware.   Will follow up with member post discharge to community.  Omar Mola "ANN" [name], RN-BSN  [facility] Care Management  Community Care Management Coordinator  [phone] [email or web address]

## 2018-02-03 NOTE — Consult Note (Addendum)
Cardiology Consult    Patient ID: Omar Lambert MRN: [id number], DOB/AGE: 1940/03/10   Admit date: 01/31/2018 Date of Consult: 02/03/2018  Primary Physician: Gayland Curry, DO Primary Cardiologist: [name], MD Requesting Provider: Hosie Poisson, MD  Patient Profile    Omar Lambert is a 77 y.o. 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 Karleen Hampshire.  History of Present Illness    [name] is a 77 year old male with the above history who is followed by Dr. Claiborne Billings. Patient was admitted in July 2019 with increasing shortness of breath. BNP was elevated at 438 at that time. Troponin was borderline elevated but stayed flat. Echocardiogram at that time showed LVEF of 50-55% with elevated PASP of 47 mmHg. Patient was also noted to be in recurrent rate controlled atrial fibrillation during that admission. Patient was diuresed with IV Lasix and discharged in stable condition. Patient recently admitted form 11/24/2017 to 12/06/2017 for acute respiratory failure felt to be secondary to acute on chronic diastolic CHF, pulmonary hypertension, COPD exacerbation, and obstructive sleep apnea. Patient last seen by Almyra Deforest, PA-C, on 01/10/2018 for hospital follow-up. Upon arrival to the office, O2 sats were 83%; however, after being placed on O2, sats increased to 93%. Otherwise, he was doing relatively well with no obvious chest pain and stable breathing. Since this visit, he is now on 2 L of supplemental O2 at home.  Patient presented to the [facility] ED on 01/31/2018 for evaluation of worsening shortness of breath and edema. Patient reports fluid build up over the past several weeks with worsening shortness of breath with minimal exertion to the point where patient has to stop  and catch his breath after walking to his car. He also notes orthopnea and abdominal/lower extremity edema. Patient also reports some chest tightness, lightheadedness, and vision changes with exertion. He also has some palpitations with his atrial fibrillation. Patient appears drowsy during exam and was unable to provide me a lot of details. Family not present during exam. However per H&P note, patient's family noticed that he was "sleepier" last week which they say is usually a sign that he is not doing well. Family said he has had an increase in abdominal girth and lower extremity edema to the point where he cannot button his paints. Family also reports he has had increases sodium intake over the holidays. Patient does not weigh himself daily but weight at last outpatient visit was 233 lbs (105.7 kg).   Upon arrival to the ED, patient was tachypneic and mildly hypertensive. EKG showed atrial fibrillation with rate of 92 bpm. I-stat troponin negative but cyclic troponin elevated and flat at 0.08 >> 0.07 >> 0.06. Chest x-ray showed cardiomegaly with slightly worsened pulmonary vascular congestion now with mild interstitial edema and enlarging and likely partially loculated right pleural effusion. BNP elevated at 494.5. WBC 4.5, Hgb 10.3, Plts 17.2. Na 145, K 4.1, Glucose 126, SCr 2.43 (baseline). Lactic acid 3.40. Patient was admitted for further management of acute CHF.   Past Medical History   Past Medical History:  Diagnosis Date  . Acute kidney injury superimposed on chronic kidney disease ([place]) 11/2016  . Acute respiratory failure ([place]) 11/2016  . Allergy   . Asthma   . Atrial fibrillation ([place])   .  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 Benign essential hypertension   . CHF (congestive heart failure) ([employer])   . CKD (chronic kidney disease), stage III ([place])   . COPD (chronic obstructive pulmonary disease) ([place])   . Diabetes ([place])   . Diabetes mellitus without complication  ([place])   . Dyspnea   . Gastritis   . GERD (gastroesophageal reflux disease)   . Gout attack 09/2012  . Hypertension   . Sinusitis, maxillary, chronic   . Thyroid disease   . Type II or unspecified type diabetes mellitus without mention of complication, not stated as uncontrolled     Past Surgical History:  Procedure Laterality Date  . COLONOSCOPY  2003  . EYE SURGERY Left 05/06/2017   Cataract removed  . EYE SURGERY Right 02/05/2013   Cataract removed  . HERNIA REPAIR  1980  . IR THORACENTESIS ASP PLEURAL SPACE W/IMG GUIDE  12/02/2017     Allergies  No Known Allergies  Inpatient Medications    . allopurinol  300 mg Oral Daily  . calcitRIOL  0.25 mcg Oral QODAY  . calcitRIOL  0.5 mcg Oral QODAY  . Influenza vac split quadrivalent PF  0.5 mL Intramuscular Tomorrow-1000  . isosorbide mononitrate  30 mg Oral Daily  . levothyroxine  175 mcg Oral QAC breakfast  . mouth rinse  15 mL Mouth Rinse BID  . multivitamin with minerals  1 tablet Oral Daily  . pantoprazole  40 mg Oral Daily  . potassium chloride SA  20 mEq Oral Daily  . rosuvastatin  20 mg Oral Daily  . sodium chloride flush  3 mL Intravenous Q12H  . umeclidinium-vilanterol  1 puff Inhalation Daily  . Warfarin - Pharmacist Dosing Inpatient   Does not apply q1800    Family History    Family History  Problem Relation Age of Onset  . Stroke Mother   . Diabetes Mother   . Cancer Sister   . Diabetes Sister   . COPD Sister   . COPD Sister   . Diabetes Father    He indicated that his mother is deceased. He indicated that his father is deceased. He indicated that only one of his three sisters is alive. He indicated that both of his daughters are alive. He indicated that his son is alive.   Social History    Social History   Socioeconomic History  . Marital status: Married    Spouse name: Not on file  . Number of children: 3  . Years of education: Not on file  . Highest education level: Not on file    Occupational History  . Occupation: Retired  Scientific laboratory technician  . Financial resource strain: Not hard at all  . Food insecurity:    Worry: Never true    Inability: Never true  . Transportation needs:    Medical: No    Non-medical: No  Tobacco Use  . Smoking status: Former Smoker    Packs/day: 1.00    Years: 20.00    Pack years: 20.00    Types: Cigarettes    Last attempt to quit: 02/05/1997    Years since quitting: 21.0  . Smokeless tobacco: Never Used  Substance and Sexual Activity  . Alcohol use: No  . Drug use: No  . Sexual activity: Not Currently  Lifestyle  . Physical activity:    Days per week: 0 days    Minutes per session: 0 min  . Stress: Not at all  Relationships  .  Social connections:    Talks on phone: Once a week    Gets together: Twice a week    Attends religious service: More than 4 times per year    Active member of club or organization: No    Attends meetings of clubs or organizations: Never    Relationship status: Married  . Intimate partner violence:    Fear of current or ex partner: No    Emotionally abused: No    Physically abused: No    Forced sexual activity: No  Other Topics Concern  . Not on file  Social History Narrative   ** Merged History Encounter **       Lives in 1 story apt with his wife, Cote Mayabb.  Sometimes exercises.  Drinks coffee.       Review of Systems    Review of Systems  Constitutional: Positive for malaise/fatigue. Negative for chills and fever.  HENT: Negative for congestion and sore throat.   Eyes: Positive for blurred vision.  Respiratory: Positive for cough and shortness of breath. Negative for hemoptysis and sputum production.   Cardiovascular: Positive for chest pain, palpitations, orthopnea and leg swelling.  Gastrointestinal: Positive for blood in stool. Negative for abdominal pain, nausea and vomiting.  Genitourinary: Negative for hematuria.  Neurological: Dizziness: lightheadedness.  Endo/Heme/Allergies:  Bruises/bleeds easily.  Psychiatric/Behavioral: Negative for substance abuse.    Physical Exam    Blood pressure 138/73, pulse 80, temperature 97.9 F (36.6 C), temperature source Oral, resp. rate (!) 27, height 5\' 9"  (1.753 m), weight 105.7 kg, SpO2 96 %.  General: 77 y.o. Obese African-American male resting sitting comfortably in hospital bed. Alert and in no acute distress. Pleasant and cooperative. HEENT: Normal  Neck: Supple. No carotid bruits. JVD elevated. Lungs: Mildly tachypneic but no significant increased work of breathing. Diffused expiratory wheezing/rhonchi noted. Diminished breath sounds in right lower lobe. Minimal crackles noted in bases. Heart: Irregularly irregular rhythm with regular rate. Distinct S1 and S2. No significant murmurs, gallops, or rubs.  Abdomen: Soft, mildly distended/obese, and non-tender to palpation. Bowel sounds present. Extremities: Bilateral lower extremities with tight skin and trace pitting edema. Radial pulses 2+ and equal bilaterally. Skin: Warm and dry.  Neuro: Alert and oriented x3. No focal deficits. Moves all extremities spontaneously. Psych: Normal affect.  Labs    Troponin [facility] Test) Recent Labs    01/31/18 1234  TROPIPOC 0.08   Recent Labs    01/31/18 2206 02/01/18 0242 02/01/18 0904  TROPONINI 0.08* 0.07* 0.06*   Lab Results  Component Value Date   WBC 5.2 02/03/2018   HGB 10.5 (L) 02/03/2018   HCT 35.2 (L) 02/03/2018   MCV 108.3 (H) 02/03/2018   PLT 158 02/03/2018    Recent Labs  Lab 01/31/18 1226  02/03/18 0222  NA 145   < > 145  K 4.1   < > 4.0  CL 99   < > 96*  CO2 36*   < > 38*  BUN 38*   < > 61*  CREATININE 2.43*   < > 2.45*  CALCIUM 9.8   < > 9.5  PROT 8.1  --   --   BILITOT 1.2  --   --   ALKPHOS 142*  --   --   ALT 18  --   --   AST 34  --   --   GLUCOSE 126*   < > 104*   < > = values in this interval not displayed.  Lab Results  Component Value Date   CHOL 135 08/31/2017   HDL 84  08/31/2017   LDLCALC 44 08/31/2017   TRIG 34 08/31/2017   No results found for: [facility]   Radiology Studies    Dg Chest 2 View  Result Date: 02/02/2018 CLINICAL DATA:  Dyspnea. EXAM: CHEST - 2 VIEW COMPARISON:  Radiograph of January 31, 2018. FINDINGS: Stable cardiomegaly. Atherosclerosis of thoracic aorta is noted. No pneumothorax is noted. Left lung is clear. Moderate right pleural effusion is noted with probable underlying atelectasis or infiltrate. Bony thorax is unremarkable. IMPRESSION: Moderate right pleural effusion with probable underlying atelectasis or infiltrate. Aortic Atherosclerosis (ICD10-I70.0). Electronically Signed   By: Marijo Conception, M.D.   On: 02/02/2018 19:40   Dg Chest 2 View  Result Date: 01/15/2018 CLINICAL DATA:  Shortness of breath. EXAM: CHEST - 2 VIEW COMPARISON:  Radiographs of December 04, 2017. FINDINGS: Stable cardiomegaly. Atherosclerosis of thoracic aorta is noted. No pneumothorax is noted. Left lung is clear. Mild right basilar atelectasis or infiltrate is noted with associated pleural effusion. Bony thorax is unremarkable. IMPRESSION: Mild right basilar atelectasis or infiltrate is noted with associated pleural effusion. Aortic Atherosclerosis (ICD10-I70.0). Electronically Signed   By: Marijo Conception, M.D.   On: 01/15/2018 08:47   Dg Chest Portable 1 View  Result Date: 01/31/2018 CLINICAL DATA:  77 year old male with shortness of breath for the past 2-3 days EXAM: PORTABLE CHEST 1 VIEW COMPARISON:  Prior chest x-ray 01/15/2018 FINDINGS: Marked cardiomegaly which likely appears exaggerated given portable frontal technique. Mediastinal widening is similar compared to prior. Atherosclerotic calcifications again noted in the transverse aorta. Marked pulmonary vascular congestion with mild interstitial pulmonary edema. Moderately large and potentially partially loculated right pleural effusion has increased compared to prior. No evidence of pneumothorax. No  acute osseous abnormality. IMPRESSION: 1. Slightly worsened pulmonary vascular congestion now with mild interstitial edema. 2. Enlarging and likely partially loculated right pleural effusion. 3. Similar degree of cardiomegaly and Aortic Atherosclerosis (ICD10-170.0) Electronically Signed   By: Jacqulynn Cadet M.D.   On: 01/31/2018 12:49    EKG     EKG: EKG was personally reviewed and demonstrates: No new ECG tracings since 01/31/2018.  Telemetry: Telemetry was personally reviewed and demonstrates: Atrial fibrillation with ventricular rates mostly in the 80's to 90's (low 100's with activity).   Cardiac Imaging    Echocardiogram 02/02/2018: Study Conclusions:  - Left ventricle: The cavity size was normal. Wall thickness was   increased in a pattern of severe LVH. Systolic function was   normal. The estimated ejection fraction was in the range of 50%   to 55%. Wall motion was normal; there were no regional wall   motion abnormalities. The study was not technically sufficient to   allow evaluation of LV diastolic dysfunction due to atrial   fibrillation. - Ventricular septum: There is a D shaped septum during diastolic   c/s RV volume overload. - Left atrium: The atrium was mildly dilated. - Right ventricle: The cavity size was mildly dilated. Wall   thickness was normal. - Right atrium: The atrium was severely dilated. Central venous   pressure (est): 15 mm Hg. - Tricuspid valve: There was severe regurgitation. - Pulmonic valve: There was mild regurgitation. - Pulmonary arteries: PA peak pressure: 62 mm Hg (S).  Impressions: - The right ventricular systolic pressure was increased consistent   with moderate pulmonary hypertension.  Assessment & Plan    Acute Diastolic CHF - Patient presented with worsening  shortness of breath and edema. - BNP elevated at 494.5. - Echo showed LVEF of 50-55% with severe LVH but no regional wall motion abnormality and elevated PASP of 62 mmHg  with mildly dilated RV and severely dilated RA (central venous pressure 15 mmHg) consistent with moderate pulmonary hypertension. - Diuresing well. Documented output of 3.6 L in the past 24 hours with net negative 6 L since admission.  - Weight 233 lbs today, down 3 lbs since admission. Weights at last outpatient visit and at last discharge were 233 lbs.  - Currently being diuresed with IV Lasix 120mg  every 8 hours. Given patient appears to be at his dry weight, we may be able to transition back to home Torsemide 60mg  twice daily later this evening or tomorrow.  - Patient may need right heart catheterization for further evaluation. However, patient has CKD stage IV with serum creatinine of 2.45. Will discuss with MD.  Atrial Fibrillation - Telemetry shows atrial fibrillation with ventricular rates mostly in the 80's to 90's (in the low 100's with activity).  - Patient is currently not on any AV nodal agents. Previously on Amiodarone but this was stopped due to significant bradycardia while in atrial fibrillation. - Continue chronic anticoagulation with Coumadin per pharmacy.  Elevated Troponin / Demand Ischemia - EKG on admission showed atrial fibrillation with non-specific ST/T changes. - Troponin minimally elevated and flat at 0.08 >> 0.07 >> 0.06. Not consistent with ACS. Likely demand ischemia in the setting of acute CHF.  - Patient does report some chest tightness, lightheadedness, and vision changes with exertion. I suspect this may be due to his CHF but may need to consider ischemic evaluation. Last Myoview in 10/2016 showed no evidence of ischemia and was low risk.  Hypertension - BP currently well controlled at 138/73. - Continue current medications.   COPD - Per primary team.  CKD Stage IV - Serum creatinine 2.43 on admission. Baseline appears to be 2.0 to 2.6.  - Serum creatinine stable at 2.45 today. - Continue to monitor renal function and electrolytes while being diuresed.  -  Followed by outpatient Nephrology.  Signed, Darreld Mclean, PA-C 02/03/2018, 11:17 AM  For questions or updates, please contact   Please consult [email or web address] for contact info under Cardiology/STEMI.   Patient seen and examined. Agree with assessment and plan.  Omar Lambert is a 77 year old African-American male who has a history of permanent atrial fibrillation, COPD, pulmonary hypertension, obstructive sleep apnea on CPAP, as well as history of acute on chronic diastolic heart failure.  I had last seen him in the office 1 year ago.  Subsequently, he has had several admissions with acute on chronic heart failure in July and more recently in October 2019.  He was currently admitted after development of progressive increasing shortness of breath and leg swelling over several weeks duration.  He has a previous history of transient dialysis and at that time his DOAC was replaced with warfarin for anticoagulation of his atrial fibrillation.  He was admitted to the emergency room tachypneic and mildly hypertensive.  ECG revealed atrial fibrillation with a ventricular rate in the 90s.  Troponins have a flat mild elevated plateau more consistent with heart failure etiology rather than acute coronary syndrome.  A chest x-ray has revealed cardiomegaly with slightly worsening pulmonary vascular congestion with mild interstitial edema.  There also was an enlarging and likely partially loculated right pleural effusion.  He was also noted to have a similar degree of cardiomegaly as  well as aortic atherosclerosis.  He has been followed by Dr. Jimmy Footman for his chronic kidney disease creatinines in the 2-2.6 range.  Since his admission he has been diuresed with furosemide 120 mg every 8 hours and has a net negative I/O of 5.9 L.  He is breathing better on supplemental oxygen.  He states that he had been only intermittently utilizing his CPAP therapy.  On exam her blood pressure is 138/73.  Pulse is irregularly  irregular in the 90s with occasional to frequent isolated PVCs.  He is obese.  He has a thick neck.  JVD approximated 7 8 cm.  He had decreased breath sounds with expiratory rhonchi.  There were basilar rales.  There was decreased breath sounds in the right base.  Rhythm was irregular regular with occasional ectopy consistent with his atrial fibrillation.  There was a 1/6 systolic murmur.  He had significant central adiposity.  There is trace to 1+ edema below his knees.  Neurologic exam is grossly nonfocal.  He has a normal affect.  His ECG, independently reviewed by me reveals atrial fibrillation at 92 bpm.  QTc interval 492 ms.  Rightward axis.  Poor R wave progression anteriorly.  Agree with current diuresis.  However with his baseline renal insufficiency and creatinine 2.45, consider transition to oral torsemide tomorrow completing today's every 8 hour intravenous furosemide.  With ventricular rate in the 90s, will add low-dose cardioselective beta-blockade with bisoprolol at 5 mg daily.  He has documented aortic atherosclerosis and may very well have underlying CAD.  However, renal function has limited evaluation with contrast.  Most recent echo suggests severe LVH with an EF of 50 to 55% without definitive wall motion abnormalities.  Reticular septal contour is consistent with RV volume overload.  He has mild left atrial enlargement but severe right atrial enlargement and significant pulmonary hypertension with an estimated peak PA pressure at 62 mm.  He may ultimately benefit from right heart catheterization   Troy Sine, MD, [facility] 02/03/2018 1:48 PM

## 2018-02-03 NOTE — Progress Notes (Signed)
ANTICOAGULATION CONSULT NOTE - Follow up [place] for Coumadin Indication: atrial fibrillation  No Known Allergies  Patient Measurements: Height: 5\' 9"  (175.3 cm) Weight: 233 lb 0.4 oz (105.7 kg) IBW/kg (Calculated) : 70.7  Vital Signs: Temp: 97.9 F (36.6 C) (12/30 0743) Temp Source: Oral (12/30 0743) BP: 138/73 (12/30 0800) Pulse Rate: 80 (12/30 0800)  Labs: Recent Labs    01/31/18 1226  01/31/18 2206 02/01/18 0242 02/01/18 0904 02/02/18 0335 02/03/18 0222  HGB 10.3*  --   --   --   --  10.2* 10.5*  HCT 36.5*  --   --   --   --  35.2* 35.2*  PLT 156  --   --   --   --  149* 158  LABPROT  --    < >  --  21.6*  --  23.3* 23.6*  INR  --    < >  --  1.90  --  2.10 2.13  CREATININE 2.43*  --   --  2.49*  --  2.58* 2.45*  TROPONINI  --   --  0.08* 0.07* 0.06*  --   --    < > = values in this interval not displayed.    Estimated Creatinine Clearance: 29.8 mL/min (A) (by C-G formula based on SCr of 2.45 mg/dL (H)).   Medical History: Past Medical History:  Diagnosis Date  . Acute kidney injury superimposed on chronic kidney disease ([place]) 11/2016  . Acute respiratory failure ([place]) 11/2016  . Allergy   . Asthma   . Atrial fibrillation ([place])   . 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 Benign essential hypertension   . CHF (congestive heart failure) ([place])   . CKD (chronic kidney disease), stage III ([place])   . COPD (chronic obstructive pulmonary disease) ([place])   . Diabetes ([place])   . Diabetes mellitus without complication ([place])   . Dyspnea   . Gastritis   . GERD (gastroesophageal reflux disease)   . Gout attack 09/2012  . Hypertension   . Sinusitis, maxillary, chronic   . Thyroid disease   . Type II or unspecified type diabetes mellitus without mention of complication, not stated as uncontrolled     Assessment: 77 y.o. M presents with SOB, swelling. Pt on coumadin PTA for afib -INR is 2.13, CBC stable   Home dose: 2.5mg  daily  except for 5mg  on Mon/Fri  Goal of Therapy:  INR 2-3 Monitor platelets by anticoagulation protocol: Yes   Plan:  Warfarin 5 mg x1 Daily INR/CBC   Hildred Laser, PharmD Clinical Pharmacist **Pharmacist phone directory can now be found on [email or web address] ([email or web address]).  Listed under [place].

## 2018-02-03 NOTE — Progress Notes (Signed)
[place] TEAM 1 - Stepdown/ICU TEAM  Omar Lambert  KGY:[id number] DOB: [birth year] DOA: 01/31/2018 PCP: Gayland Curry, DO    Brief Narrative:  77 y.o.malew/ a hx ofDM2, Hypothyroidism, HTN, Gout, GERD, COPD, CKD, diastolic CHF, and asthma who presented w/ SOB and edema.   Subjective: Feels that his breathing is slowly improving. Denies SSCP, n/v, or abdom pain.   Assessment & Plan:  Acute on chronic diastolic heart failure  secondary to noncompliance w/ fluid and salt restriction - f/u TTE noted severe LVH w/ EF 50-55% w/ no WMA - baseline appears to be ~106kg - Cards contemplating RHC - transition back to home diuretic dose in AM   Filed Weights   02/01/18 0559 02/02/18 0537 02/03/18 0355  Weight: 108.3 kg 108.4 kg 105.7 kg    Elevated troponin 0.08 > 0.07 > 0.06 - no sx to suggest USAP - no further w/u planned at this time   O2 dependent COPD 2LPM at all times at home - wean to home dose and follow    HTN BP reasonably controlled at this time   Stage III chronic kidney disease Baseline crt 2.0-2.6 - currently stable  Recent Labs  Lab 01/31/18 1226 02/01/18 0242 02/02/18 0335 02/03/18 0222  CREATININE 2.43* 2.49* 2.58* 2.45*    Persistent atrial fibrillation Coumadin for anticoagulation - amio previously stopped due to bradycardia - rate controlled at this time - BB being added by Cards   DM2 with renal complications CBG controlled   HLD  DVT prophylaxis: warfarin  Code Status: FULL CODE Family Communication: spoke w/ wife at bedside  Disposition Plan: SDU   Consultants:  none  Antimicrobials:  none   Objective: Blood pressure (!) 142/74, pulse 93, temperature (!) 97.2 F (36.2 C), temperature source Oral, resp. rate (!) 23, height 5\' 9"  (1.753 m), weight 105.7 kg, SpO2 95 %.  Intake/Output Summary (Last 24 hours) at 02/03/2018 1420 Last data filed at 02/03/2018 1412 Gross per 24 hour  Intake 1237.77 ml  Output 4625 ml  Net -3387.23 ml     Filed Weights   02/01/18 0559 02/02/18 0537 02/03/18 0355  Weight: 108.3 kg 108.4 kg 105.7 kg    Examination: General: No acute respiratory distress Lungs: mild scattered crackles - no wheezing  Cardiovascular: Regular rate w/o gallup or rub  Abdomen: Nontender, nondistended, soft, bowel sounds positive, no rebound, no ascites, no appreciable mass Extremities: 1+ B LE edema   CBC: Recent Labs  Lab 01/31/18 1226 02/02/18 0335 02/03/18 0222  WBC 4.5 7.3 5.2  NEUTROABS 3.4  --   --   HGB 10.3* 10.2* 10.5*  HCT 36.5* 35.2* 35.2*  MCV 112.0* 107.0* 108.3*  PLT 156 149* 026   Basic Metabolic Panel: Recent Labs  Lab 02/01/18 0242 02/02/18 0335 02/03/18 0222  NA 143 141 145  K 4.8 4.5 4.0  CL 98 95* 96*  CO2 29 34* 38*  GLUCOSE 142* 97 104*  BUN 45* 59* 61*  CREATININE 2.49* 2.58* 2.45*  CALCIUM 9.3 9.3 9.5   GFR: Estimated Creatinine Clearance: 29.8 mL/min (A) (by C-G formula based on SCr of 2.45 mg/dL (H)).  Liver Function Tests: Recent Labs  Lab 01/31/18 1226  AST 34  ALT 18  ALKPHOS 142*  BILITOT 1.2  PROT 8.1  ALBUMIN 3.7    Coagulation Profile: Recent Labs  Lab 01/31/18 1341 02/01/18 0242 02/02/18 0335 02/03/18 0222  INR 2.08 1.90 2.10 2.13    Cardiac Enzymes: Recent Labs  Lab  01/31/18 2206 02/01/18 0242 02/01/18 0904  TROPONINI 0.08* 0.07* 0.06*    HbA1C: Hgb A1c MFr Bld  Date/Time Value Ref Range Status  11/26/2017 02:17 AM 6.2 (H) 4.8 - 5.6 % Final    Comment:    (NOTE) Pre diabetes:          5.7%-6.4% Diabetes:              >6.4% Glycemic control for   <7.0% adults with diabetes   07/29/2017 10:32 AM 6.0 (H) <5.7 % of total Hgb Final    Comment:    For someone without known diabetes, a hemoglobin  A1c value between 5.7% and 6.4% is consistent with prediabetes and should be confirmed with a  follow-up test. . For someone with known diabetes, a value <7% indicates that their diabetes is well controlled. A1c targets  should be individualized based on duration of diabetes, age, comorbid conditions, and other considerations. . This assay result is consistent with an increased risk of diabetes. . Currently, no consensus exists regarding use of hemoglobin A1c for diagnosis of diabetes for children. .     CBG: Recent Labs  Lab 02/02/18 1132 02/02/18 1706 02/02/18 2139 02/03/18 0759 02/03/18 1217  GLUCAP 93 104* 110* 107* 109*    Recent Results (from the past 240 hour(s))  Blood culture (routine x 2)     Status: None (Preliminary result)   Collection Time: 01/31/18 12:28 PM  Result Value Ref Range Status   Specimen Description BLOOD RIGHT ANTECUBITAL  Final   Special Requests   Final    BOTTLES DRAWN AEROBIC AND ANAEROBIC Blood Culture adequate volume   Culture   Final    NO GROWTH 2 DAYS Performed at [facility], [place] [address] [place], [place] [address]    Report Status PENDING  Incomplete  Blood culture (routine x 2)     Status: None (Preliminary result)   Collection Time: 01/31/18  1:41 PM  Result Value Ref Range Status   Specimen Description BLOOD LEFT ANTECUBITAL  Final   Special Requests   Final    BOTTLES DRAWN AEROBIC AND ANAEROBIC Blood Culture adequate volume   Culture   Final    NO GROWTH 2 DAYS Performed at [facility], [place] [address] [place], [place] [address]    Report Status PENDING  Incomplete  MRSA PCR Screening     Status: None   Collection Time: 01/31/18  9:38 PM  Result Value Ref Range Status   MRSA by PCR NEGATIVE NEGATIVE Final    Comment:        The GeneXpert MRSA Assay (FDA approved for NASAL specimens only), is one component of a comprehensive MRSA colonization surveillance program. It is not intended to diagnose MRSA infection nor to guide or monitor treatment for MRSA infections. Performed at [facility], [place] [address] [place], [place] [address]      Scheduled Meds: . allopurinol  300 mg Oral Daily  . bisoprolol  5  mg Oral Daily  . calcitRIOL  0.25 mcg Oral QODAY  . calcitRIOL  0.5 mcg Oral QODAY  . Influenza vac split quadrivalent PF  0.5 mL Intramuscular Tomorrow-1000  . isosorbide mononitrate  30 mg Oral Daily  . levothyroxine  175 mcg Oral QAC breakfast  . mouth rinse  15 mL Mouth Rinse BID  . multivitamin with minerals  1 tablet Oral Daily  . pantoprazole  40 mg Oral Daily  . potassium chloride SA  20 mEq Oral Daily  . rosuvastatin  20 mg Oral Daily  . sodium chloride flush  3 mL Intravenous Q12H  . umeclidinium-vilanterol  1 puff Inhalation Daily  . warfarin  5 mg Oral ONCE-1800  . Warfarin - Pharmacist Dosing Inpatient   Does not apply q1800     LOS: 3 days   Cherene Altes, MD [facility] Office  [phone] Pager - Text Page per Amion  If 7PM-7AM, please contact night-coverage per Amion 02/03/2018, 2:20 PM

## 2018-02-03 NOTE — Evaluation (Signed)
Occupational Therapy Evaluation Patient Details Name: Omar Lambert MRN: [id number] DOB: [birth year] Today's Date: 02/03/2018    History of Present Illness Omar Holness Wilsonis a 77 y.o.malewith medical history significant ofDM2, Hypothyroidism, HTN, Gout, GERD, COPD, CKD, diastolic CHF, asthma who presents for SOB and swelling. Pt admitted for acute on chronic diastolic heart failure.    Clinical Impression   PTA Pt independent in ADL and mobility with SPC PRN. Pt today was overall supervision for ADL (LB dressing, grooming while standing at sink). Wife inquired after a tub bench vs 3 in 1 and pros/cons explained. OT demo-ed safe use of 3 in 1 as shower chair and safe transfers. Wife present throughout session. OT will follow acutely and next session plan to educate on energy conservation as well as practice tub transfer.     Follow Up Recommendations  Supervision - Intermittent    Equipment Recommendations  None recommended by OT(Pt has appropriate DME)    Recommendations for Other Services       Precautions / Restrictions Precautions Precaution Comments: watch 02 saturations Restrictions Weight Bearing Restrictions: No      Mobility Bed Mobility               General bed mobility comments: sitting EOB at beginning and end of session  Transfers Overall transfer level: Modified independent Equipment used: None             General transfer comment: no physical assist, increased time to power up to standing,    Balance Overall balance assessment: Mild deficits observed, not formally tested;History of Falls                                         ADL either performed or assessed with clinical judgement   ADL Overall ADL's : Needs assistance/impaired Eating/Feeding: Independent   Grooming: Supervision/safety;Standing;Wash/dry hands;Wash/dry face;Oral care Grooming Details (indicate cue type and reason): sink level, on 2L via  and  saturations remained about 94 throughout Upper Body Bathing: Supervision/ safety   Lower Body Bathing: Supervison/ safety;Sitting/lateral leans   Upper Body Dressing : Supervision/safety   Lower Body Dressing: Min guard;Sitting/lateral leans Lower Body Dressing Details (indicate cue type and reason): able to don/doff socks EOB Toilet Transfer: Min guard;Ambulation   Toileting- Clothing Manipulation and Hygiene: Supervision/safety;Sit to/from stand   Tub/ Shower Transfer: Tub transfer;Min guard;With caregiver independent assisting;Ambulation;3 in 1 Tub/Shower Transfer Details (indicate cue type and reason): educated on safe use of 3 in 1, and using walls for stability as Pt does not have grab bars. Pt and wife verbalized understanding   General ADL Comments: close to baseline. Pt and wife wanting to know more about shower safety     Vision Patient Visual Report: No change from baseline       Perception     Praxis      Pertinent Vitals/Pain Pain Assessment: No/denies pain     Hand Dominance Right   Extremity/Trunk Assessment Upper Extremity Assessment Upper Extremity Assessment: Overall WFL for tasks assessed   Lower Extremity Assessment Lower Extremity Assessment: Defer to PT evaluation       Communication Communication Communication: No difficulties   Cognition Arousal/Alertness: Awake/alert Behavior During Therapy: WFL for tasks assessed/performed Overall Cognitive Status: History of cognitive impairments - at baseline  General Comments  wife present throughout    Exercises     Shoulder Instructions      [place] expects to be discharged to:: Private residence Living Arrangements: Spouse/significant other Available Help at Discharge: Family;Available 24 hours/day(Part time job for wife in evenings) Type of Home: House Home Access: Level entry     [place]: One level      Bathroom Shower/Tub: Teacher, early years/pre: [place] - single point;Walker - 2 wheels;Bedside commode   Additional Comments: patient reports having a cane that he sometimes carries but does not use for walking      Prior Functioning/Environment Level of Independence: Independent                 OT Problem List: Decreased activity tolerance;Impaired balance (sitting and/or standing);Cardiopulmonary status limiting activity;Obesity      OT Treatment/Interventions: Self-care/ADL training;Energy conservation;Therapeutic activities;Patient/family education;Balance training    OT Goals(Current goals can be found in the care plan section) Acute Rehab OT Goals Patient Stated Goal: to go home OT Goal Formulation: With patient/family Time For Goal Achievement: 02/17/18 Potential to Achieve Goals: Good ADL Goals Pt Will Perform Tub/Shower Transfer: Tub transfer;3 in 1;with caregiver independent in assisting;ambulating Additional ADL Goal #1: Pt will recall 3 ways of conserving energy during ADL at independent level  OT Frequency: Min 2X/week   Barriers to D/C:            Co-evaluation              AM-PAC OT "6 Clicks" Daily Activity     Outcome Measure Help from another person eating meals?: None Help from another person taking care of personal grooming?: A Little Help from another person toileting, which includes using toliet, bedpan, or urinal?: None Help from another person bathing (including washing, rinsing, drying)?: A Little Help from another person to put on and taking off regular upper body clothing?: None Help from another person to put on and taking off regular lower body clothing?: None 6 Click Score: 22   End of Session Equipment Utilized During Treatment: Gait belt;Oxygen(2L) Nurse Communication: Mobility status;Other (comment)(emptied 1300cc of urine from bag)  Activity Tolerance: Patient tolerated treatment  well Patient left: in bed;with call bell/phone within reach;with family/visitor present(EOB)  OT Visit Diagnosis: History of falling (Z91.81)                Time: 4132-4401 OT Time Calculation (min): 17 min Charges:  OT General Charges $OT Visit: 1 Visit OT Evaluation $OT Eval Moderate Complexity: [place] OTR/L Acute Rehabilitation Services Pager: [phone] Office: [place] 02/03/2018, 9:58 AM

## 2018-02-03 NOTE — Progress Notes (Signed)
Physical Therapy Treatment Patient Details Name: Omar Lambert MRN: [id number] DOB: 1940/12/10 Today's Date: 02/03/2018    History of Present Illness Omar Lambert a 77 y.o.malewith medical history significant ofDM2, Hypothyroidism, HTN, Gout, GERD, COPD, CKD, diastolic CHF, asthma who presents for SOB and swelling. Pt admitted for acute on chronic diastolic heart failure.     PT Comments    Pt making steady progress.    Follow Up Recommendations  Home health PT     Equipment Recommendations  None recommended by PT    Recommendations for Other Services       Precautions / Restrictions Precautions Precaution Comments: watch 02 saturations Restrictions Weight Bearing Restrictions: No    Mobility  Bed Mobility               General bed mobility comments: Pt up on EOB  Transfers Overall transfer level: Modified independent Equipment used: None             General transfer comment: from bed and from commode  Ambulation/Gait Ambulation/Gait assistance: Supervision;Min guard Gait Distance (Feet): 200 Feet Assistive device: None(wall rail at times) Gait Pattern/deviations: Step-through pattern;Decreased stride length;Drifts right/left Gait velocity: decreased Gait velocity interpretation: <1.31 ft/sec, indicative of household ambulator General Gait Details: in room pt required supervision for safety. In hallway pt required min guard and he frequently used the wall rail   Stairs             Wheelchair Mobility    Modified Rankin (Stroke Patients Only)       Balance Overall balance assessment: Mild deficits observed, not formally tested;History of Falls                                          Cognition Arousal/Alertness: Awake/alert Behavior During Therapy: WFL for tasks assessed/performed Overall Cognitive Status: History of cognitive impairments - at baseline                                        Exercises      General Comments        Pertinent Vitals/Pain Pain Assessment: No/denies pain    Home Living                      Prior Function            PT Goals (current goals can now be found in the care plan section) Progress towards PT goals: Progressing toward goals    Frequency    Min 2X/week      PT Plan Current plan remains appropriate    Co-evaluation              AM-PAC PT "6 Clicks" Mobility   Outcome Measure  Help needed turning from your back to your side while in a flat bed without using bedrails?: None Help needed moving from lying on your back to sitting on the side of a flat bed without using bedrails?: None Help needed moving to and from a bed to a chair (including a wheelchair)?: A Little Help needed standing up from a chair using your arms (e.g., wheelchair or bedside chair)?: None Help needed to walk in hospital room?: A Little Help needed climbing 3-5 steps with a railing? : A Lot 6 Click Score:  20    End of Session Equipment Utilized During Treatment: Oxygen Activity Tolerance: Patient limited by fatigue Patient left: in bed;with call bell/phone within reach(sitting EOB)   PT Visit Diagnosis: Difficulty in walking, not elsewhere classified (R26.2)     Time: 4163-8453 PT Time Calculation (min) (ACUTE ONLY): 18 min  Charges:  $Gait Training: 8-22 mins                     [place] Pager [phone] Office [place] 02/03/2018, 1:40 PM

## 2018-02-03 NOTE — Consult Note (Signed)
   [facility] Inpatient Consult   02/03/2018  Omar Lambert 04/13/1940 [id number]  Patient is currently active with [facility] for chronic disease management services.  Patient has been engaged by a [facility] RN [facility].   Our community based plan of care has focused on disease management and community resource support.   Will follow for progress and disposition needs. Of note, [facility] Care Management services does not replace or interfere with any services that are needed or arranged by inpatient case management or social work.  For additional questions or referrals please contact:  Natividad Brood, RN BSN [facility] Liaison  [phone] business mobile phone Toll free office [phone]

## 2018-02-04 ENCOUNTER — Inpatient Hospital Stay (HOSPITAL_COMMUNITY): Payer: Medicare HMO

## 2018-02-04 DIAGNOSIS — J441 Chronic obstructive pulmonary disease with (acute) exacerbation: Secondary | ICD-10-CM

## 2018-02-04 DIAGNOSIS — I509 Heart failure, unspecified: Secondary | ICD-10-CM

## 2018-02-04 DIAGNOSIS — R0602 Shortness of breath: Secondary | ICD-10-CM

## 2018-02-04 LAB — BLOOD GAS, ARTERIAL
Acid-Base Excess: 14.6 mmol/L — ABNORMAL HIGH (ref 0.0–2.0)
BICARBONATE: 40.4 mmol/L — AB (ref 20.0–28.0)
O2 Content: 3 L/min
O2 Saturation: 92 %
PO2 ART: 68.1 mmHg — AB (ref 83.0–108.0)
Patient temperature: 98.9
pCO2 arterial: 70.3 mmHg (ref 32.0–48.0)
pH, Arterial: 7.378 (ref 7.350–7.450)

## 2018-02-04 LAB — GLUCOSE, CAPILLARY
GLUCOSE-CAPILLARY: 73 mg/dL (ref 70–99)
GLUCOSE-CAPILLARY: 108 mg/dL — AB (ref 70–99)
Glucose-Capillary: 85 mg/dL (ref 70–99)
Glucose-Capillary: 103 mg/dL — ABNORMAL HIGH (ref 70–99)

## 2018-02-04 LAB — CBC
HCT: 37.1 % — ABNORMAL LOW (ref 39.0–52.0)
Hemoglobin: 10.9 g/dL — ABNORMAL LOW (ref 13.0–17.0)
MCH: 32.2 pg (ref 26.0–34.0)
MCHC: 29.4 g/dL — ABNORMAL LOW (ref 30.0–36.0)
MCV: 109.4 fL — ABNORMAL HIGH (ref 80.0–100.0)
Platelets: 158 10*3/uL (ref 150–400)
RBC: 3.39 MIL/uL — AB (ref 4.22–5.81)
RDW: 17.1 % — ABNORMAL HIGH (ref 11.5–15.5)
WBC: 4.8 10*3/uL (ref 4.0–10.5)
nRBC: 0 % (ref 0.0–0.2)

## 2018-02-04 LAB — BASIC METABOLIC PANEL
ANION GAP: 13 (ref 5–15)
BUN: 60 mg/dL — ABNORMAL HIGH (ref 8–23)
CO2: 38 mmol/L — ABNORMAL HIGH (ref 22–32)
Calcium: 9.6 mg/dL (ref 8.9–10.3)
Chloride: 90 mmol/L — ABNORMAL LOW (ref 98–111)
Creatinine, Ser: 2.53 mg/dL — ABNORMAL HIGH (ref 0.61–1.24)
GFR calc non Af Amer: 23 mL/min — ABNORMAL LOW (ref >60)
GFR, EST AFRICAN AMERICAN: 27 mL/min — AB (ref >60)
Glucose, Bld: 105 mg/dL — ABNORMAL HIGH (ref 70–99)
Potassium: 4.2 mmol/L (ref 3.5–5.1)
Sodium: 141 mmol/L (ref 135–145)

## 2018-02-04 LAB — PROTIME-INR
INR: 2.02
Prothrombin Time: 22.6 seconds — ABNORMAL HIGH (ref 11.4–15.2)

## 2018-02-04 MED ORDER — SENNOSIDES-DOCUSATE SODIUM 8.6-50 MG PO TABS
1.0000 | ORAL_TABLET | Freq: Two times a day (BID) | ORAL | Status: DC
  Administered 2018-02-04 – 2018-02-15 (×22): 1 via ORAL
  Filled 2018-02-04 (×23): qty 1

## 2018-02-04 MED ORDER — POLYETHYLENE GLYCOL 3350 17 G PO PACK
17.0000 g | PACK | Freq: Every day | ORAL | Status: DC
  Administered 2018-02-04 – 2018-02-15 (×11): 17 g via ORAL
  Filled 2018-02-04 (×11): qty 1

## 2018-02-04 MED ORDER — WARFARIN SODIUM 2.5 MG PO TABS
2.5000 mg | ORAL_TABLET | Freq: Once | ORAL | Status: AC
  Administered 2018-02-04: 2.5 mg via ORAL
  Filled 2018-02-04: qty 1

## 2018-02-04 MED ORDER — FUROSEMIDE 10 MG/ML IJ SOLN
120.0000 mg | Freq: Three times a day (TID) | INTRAVENOUS | Status: DC
  Administered 2018-02-04 – 2018-02-06 (×8): 120 mg via INTRAVENOUS
  Filled 2018-02-04: qty 2
  Filled 2018-02-04: qty 12
  Filled 2018-02-04 (×2): qty 10
  Filled 2018-02-04: qty 2
  Filled 2018-02-04 (×3): qty 12
  Filled 2018-02-04: qty 2
  Filled 2018-02-04 (×2): qty 10

## 2018-02-04 NOTE — Progress Notes (Signed)
ANTICOAGULATION CONSULT NOTE - Follow up [place] for warfarin Indication: atrial fibrillation  No Known Allergies  Patient Measurements: Height: 5\' 9"  (175.3 cm) Weight: 231 lb 14.4 oz (105.2 kg) IBW/kg (Calculated) : 70.7  Vital Signs: Temp: 97.8 F (36.6 C) (12/31 0758) Temp Source: Oral (12/31 0758) BP: 111/85 (12/31 0758) Pulse Rate: 75 (12/31 0758)  Labs: Recent Labs    02/01/18 0904  02/02/18 0335 02/03/18 0222 02/04/18 0329  HGB  --    < > 10.2* 10.5* 10.9*  HCT  --   --  35.2* 35.2* 37.1*  PLT  --   --  149* 158 158  LABPROT  --   --  23.3* 23.6* 22.6*  INR  --   --  2.10 2.13 2.02  CREATININE  --   --  2.58* 2.45* 2.53*  TROPONINI 0.06*  --   --   --   --    < > = values in this interval not displayed.    Estimated Creatinine Clearance: 28.8 mL/min (A) (by C-G formula based on SCr of 2.53 mg/dL (H)).   Medical History: Past Medical History:  Diagnosis Date  . Acute kidney injury superimposed on chronic kidney disease ([place]) 11/2016  . Acute respiratory failure ([place]) 11/2016  . Allergy   . Asthma   . Atrial fibrillation ([place])   . 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 Benign essential hypertension   . CHF (congestive heart failure) ([place])   . CKD (chronic kidney disease), stage III ([place])   . COPD (chronic obstructive pulmonary disease) ([place])   . Diabetes ([place])   . Diabetes mellitus without complication ([place])   . Dyspnea   . Gastritis   . GERD (gastroesophageal reflux disease)   . Gout attack 09/2012  . Hypertension   . Sinusitis, maxillary, chronic   . Thyroid disease   . Type II or unspecified type diabetes mellitus without mention of complication, not stated as uncontrolled     Assessment: 1 yoM admitted with SOB and swelling. Pt on warfarin at home for AFib, INR therapeutic at 2.02, CBC stable.   Home dose: 2.5mg  daily except for 5mg  on Mon/Fri  Goal of Therapy:  INR 2-3 Monitor platelets by  anticoagulation protocol: Yes   Plan:  -Warfarin 2.5mg  PO x1 tonight -Daily INR  Arrie Senate, PharmD, BCPS Clinical Pharmacist [phone] Please check AMION for all [place] numbers 02/04/2018

## 2018-02-04 NOTE — Progress Notes (Signed)
[place] TEAM 1 - Stepdown/ICU TEAM  EMAAD NANNA  LGX:[id number] DOB: December 06, 1940 DOA: 01/31/2018 PCP: Gayland Curry, DO    Brief Narrative:  77 y.o.malew/ a hx ofDM2, Hypothyroidism, HTN, Gout, GERD, COPD, CKD, diastolic CHF, and asthma who presented w/ SOB and edema.   Subjective: The pt is mildly confused this morning. He c/o worsening SOB, but denies cp, n/v, or abdom pain.   Assessment & Plan:  Acute on chronic diastolic heart failure  secondary to noncompliance w/ fluid and salt restriction - f/u TTE noted severe LVH w/ EF 50-55% w/ no WMA - baseline appears to be ~106kg - Cards contemplating RHC - will resume IV diuretic w/ c/o SOB - does still have some peripheral edema - will need to watch renal fxn    Filed Weights   02/02/18 0537 02/03/18 0355 02/04/18 0300  Weight: 108.4 kg 105.7 kg 105.2 kg    Elevated troponin 0.08 > 0.07 > 0.06 - no sx to suggest USAP - no further w/u planned at this time   O2 dependent COPD 2LPM at all times at home - weaned to home O2 dose    HTN BP reasonably controlled at this time   Stage III chronic kidney disease Baseline crt 2.0-2.6 - currently stable in baseline range  Recent Labs  Lab 01/31/18 1226 02/01/18 0242 02/02/18 0335 02/03/18 0222 02/04/18 0329  CREATININE 2.43* 2.49* 2.58* 2.45* 2.53*    Persistent atrial fibrillation Coumadin for anticoagulation - amio previously stopped due to bradycardia - rate controlled at this time - BB was added by Cards   DM2 with renal complications CBG controlled   HLD  DVT prophylaxis: warfarin  Code Status: FULL CODE Family Communication: no family present at time of exam   Disposition Plan: transfer to tele bed - ambulate - cont IV diuretic   Consultants:  none  Antimicrobials:  none   Objective: Blood pressure 111/85, pulse 75, temperature 97.8 F (36.6 C), temperature source Oral, resp. rate (!) 25, height 5\' 9"  (1.753 m), weight 105.2 kg, SpO2 97  %.  Intake/Output Summary (Last 24 hours) at 02/04/2018 0849 Last data filed at 02/04/2018 0809 Gross per 24 hour  Intake 2245.88 ml  Output 3800 ml  Net -1554.12 ml   Filed Weights   02/02/18 0537 02/03/18 0355 02/04/18 0300  Weight: 108.4 kg 105.7 kg 105.2 kg    Examination: General: No acute respiratory distress but c/o dyspnea  Lungs: mild scattered crackles persist - no wheezing  Cardiovascular: Regular rate w/o gallup or rub - no M   Abdomen: NT, soft, bs+, no mass, protuberant Extremities: 1+ B LE edema persists   CBC: Recent Labs  Lab 01/31/18 1226 02/02/18 0335 02/03/18 0222 02/04/18 0329  WBC 4.5 7.3 5.2 4.8  NEUTROABS 3.4  --   --   --   HGB 10.3* 10.2* 10.5* 10.9*  HCT 36.5* 35.2* 35.2* 37.1*  MCV 112.0* 107.0* 108.3* 109.4*  PLT 156 149* 158 814   Basic Metabolic Panel: Recent Labs  Lab 02/02/18 0335 02/03/18 0222 02/04/18 0329  NA 141 145 141  K 4.5 4.0 4.2  CL 95* 96* 90*  CO2 34* 38* 38*  GLUCOSE 97 104* 105*  BUN 59* 61* 60*  CREATININE 2.58* 2.45* 2.53*  CALCIUM 9.3 9.5 9.6   GFR: Estimated Creatinine Clearance: 28.8 mL/min (A) (by C-G formula based on SCr of 2.53 mg/dL (H)).  Liver Function Tests: Recent Labs  Lab 01/31/18 1226  AST 34  ALT 18  ALKPHOS 142*  BILITOT 1.2  PROT 8.1  ALBUMIN 3.7    Coagulation Profile: Recent Labs  Lab 01/31/18 1341 02/01/18 0242 02/02/18 0335 02/03/18 0222 02/04/18 0329  INR 2.08 1.90 2.10 2.13 2.02    Cardiac Enzymes: Recent Labs  Lab 01/31/18 2206 02/01/18 0242 02/01/18 0904  TROPONINI 0.08* 0.07* 0.06*    HbA1C: Hgb A1c MFr Bld  Date/Time Value Ref Range Status  11/26/2017 02:17 AM 6.2 (H) 4.8 - 5.6 % Final    Comment:    (NOTE) Pre diabetes:          5.7%-6.4% Diabetes:              >6.4% Glycemic control for   <7.0% adults with diabetes   07/29/2017 10:32 AM 6.0 (H) <5.7 % of total Hgb Final    Comment:    For someone without known diabetes, a hemoglobin  A1c  value between 5.7% and 6.4% is consistent with prediabetes and should be confirmed with a  follow-up test. . For someone with known diabetes, a value <7% indicates that their diabetes is well controlled. A1c targets should be individualized based on duration of diabetes, age, comorbid conditions, and other considerations. . This assay result is consistent with an increased risk of diabetes. . Currently, no consensus exists regarding use of hemoglobin A1c for diagnosis of diabetes for children. .     CBG: Recent Labs  Lab 02/02/18 2139 02/03/18 0759 02/03/18 1217 02/03/18 1700 02/04/18 0756  GLUCAP 110* 107* 109* 91 73    Recent Results (from the past 240 hour(s))  Blood culture (routine x 2)     Status: None (Preliminary result)   Collection Time: 01/31/18 12:28 PM  Result Value Ref Range Status   Specimen Description BLOOD RIGHT ANTECUBITAL  Final   Special Requests   Final    BOTTLES DRAWN AEROBIC AND ANAEROBIC Blood Culture adequate volume   Culture   Final    NO GROWTH 4 DAYS Performed at [facility], [place] [address], [place], [place] [address]    Report Status PENDING  Incomplete  Blood culture (routine x 2)     Status: None (Preliminary result)   Collection Time: 01/31/18  1:41 PM  Result Value Ref Range Status   Specimen Description BLOOD LEFT ANTECUBITAL  Final   Special Requests   Final    BOTTLES DRAWN AEROBIC AND ANAEROBIC Blood Culture adequate volume   Culture   Final    NO GROWTH 4 DAYS Performed at [facility], [place] [address], [place], [place] [address]    Report Status PENDING  Incomplete  MRSA PCR Screening     Status: None   Collection Time: 01/31/18  9:38 PM  Result Value Ref Range Status   MRSA by PCR NEGATIVE NEGATIVE Final    Comment:        The GeneXpert MRSA Assay (FDA approved for NASAL specimens only), is one component of a comprehensive MRSA colonization surveillance program. It is not intended to diagnose  MRSA infection nor to guide or monitor treatment for MRSA infections. Performed at [facility], [place] [address], [place], [place] [address]      Scheduled Meds: . allopurinol  300 mg Oral Daily  . bisoprolol  5 mg Oral Daily  . calcitRIOL  0.25 mcg Oral QODAY  . calcitRIOL  0.5 mcg Oral QODAY  . docusate sodium  100 mg Oral BID  . Influenza vac split quadrivalent  PF  0.5 mL Intramuscular Tomorrow-1000  . isosorbide mononitrate  30 mg Oral Daily  . levothyroxine  175 mcg Oral QAC breakfast  . mouth rinse  15 mL Mouth Rinse BID  . multivitamin with minerals  1 tablet Oral Daily  . pantoprazole  40 mg Oral Daily  . potassium chloride SA  20 mEq Oral Daily  . rosuvastatin  20 mg Oral Daily  . sodium chloride flush  3 mL Intravenous Q12H  . torsemide  60 mg Oral BID  . umeclidinium-vilanterol  1 puff Inhalation Daily  . warfarin  2.5 mg Oral ONCE-1800  . Warfarin - Pharmacist Dosing Inpatient   Does not apply q1800     LOS: 4 days   Cherene Altes, MD [facility] Office  [phone] Pager - Text Page per Amion  If 7PM-7AM, please contact night-coverage per Amion 02/04/2018, 8:49 AM

## 2018-02-04 NOTE — Care Management Important Message (Signed)
Important Message  Patient Details  Name: Omar Lambert MRN: [id number] Date of Birth: Oct 22, 1940   Medicare Important Message Given:  Yes    Mikhi Athey P Dailin Sosnowski 02/04/2018, 4:13 PM

## 2018-02-04 NOTE — Progress Notes (Addendum)
Progress Note  Patient Name: KYEN TAITE Date of Encounter: 02/04/2018  Primary Cardiologist: [name], MD   Subjective   Patient continues to report shortness of breath this morning as well as some chest tightness. Patient appears very drowsy on exam and was not able to answer all my questions. He was able to tell me his name and date of birth but was unable to tell me month or year.   Inpatient Medications    Scheduled Meds: . allopurinol  300 mg Oral Daily  . bisoprolol  5 mg Oral Daily  . calcitRIOL  0.25 mcg Oral QODAY  . calcitRIOL  0.5 mcg Oral QODAY  . Influenza vac split quadrivalent PF  0.5 mL Intramuscular Tomorrow-1000  . isosorbide mononitrate  30 mg Oral Daily  . levothyroxine  175 mcg Oral QAC breakfast  . mouth rinse  15 mL Mouth Rinse BID  . multivitamin with minerals  1 tablet Oral Daily  . pantoprazole  40 mg Oral Daily  . polyethylene glycol  17 g Oral Daily  . potassium chloride SA  20 mEq Oral Daily  . rosuvastatin  20 mg Oral Daily  . senna-docusate  1 tablet Oral BID  . sodium chloride flush  3 mL Intravenous Q12H  . umeclidinium-vilanterol  1 puff Inhalation Daily  . warfarin  2.5 mg Oral ONCE-1800  . Warfarin - Pharmacist Dosing Inpatient   Does not apply q1800   Continuous Infusions: . sodium chloride Stopped (02/04/18 0004)  . furosemide     PRN Meds: sodium chloride, acetaminophen, albuterol, fluticasone, ondansetron (ZOFRAN) IV, sodium chloride flush   Vital Signs    Vitals:   02/03/18 2311 02/04/18 0300 02/04/18 0725 02/04/18 0758  BP: 121/90 131/80  111/85  Pulse: 71 79  75  Resp: (!) 27 (!) 27 18 (!) 25  Temp: 97.9 F (36.6 C) (!) 97.3 F (36.3 C)  97.8 F (36.6 C)  TempSrc: Oral Oral  Oral  SpO2: 95% 98% 92% 97%  Weight:  105.2 kg    Height:        Intake/Output Summary (Last 24 hours) at 02/04/2018 1021 Last data filed at 02/04/2018 0809 Gross per 24 hour  Intake 1782.88 ml  Output 2500 ml  Net -717.12 ml    Filed Weights   02/02/18 0537 02/03/18 0355 02/04/18 0300  Weight: 108.4 kg 105.7 kg 105.2 kg    Telemetry    Atrial fibrillation with ventricular rates in the 70's to 90's with multiple PVCs. - Personally Reviewed  ECG    No new ECG tracings today. - Personally Reviewed  Physical Exam   General: 77 y.o. obese African-American male resting comfortably in no acute distress. HEENT: Normal           Neck: Supple. No carotid bruits. JVD elevated. Lungs: Mildly tachypneic but no significant increased work of breathing. Diffused expiratory wheezing noted. Diminished breath sounds bilaterally but especially in right lower lobe. Minimal crackles noted in bases. Heart: Irregularly irregular rhythm with regular rate. Distinct S1 and S2. No significant murmurs, gallops, or rubs.  Abdomen: Soft, mildly distended/obese, and non-tender to palpation. Bowel sounds present. Extremities: Bilateral lower extremities with tight skin and 1+ pitting edema. Radial pulses 2+ and equal bilaterally. Skin: Warm and dry.  Neuro: Drowsy. Oriented to name and date of birth but could not tell me the current month or year. No focal deficits. Moves all extremities spontaneously. Psych: Drowsy.  Labs    Chemistry Recent Labs  Lab 01/31/18 1226  02/02/18 0335 02/03/18 0222 02/04/18 0329  NA 145   < > 141 145 141  K 4.1   < > 4.5 4.0 4.2  CL 99   < > 95* 96* 90*  CO2 36*   < > 34* 38* 38*  GLUCOSE 126*   < > 97 104* 105*  BUN 38*   < > 59* 61* 60*  CREATININE 2.43*   < > 2.58* 2.45* 2.53*  CALCIUM 9.8   < > 9.3 9.5 9.6  PROT 8.1  --   --   --   --   ALBUMIN 3.7  --   --   --   --   AST 34  --   --   --   --   ALT 18  --   --   --   --   ALKPHOS 142*  --   --   --   --   BILITOT 1.2  --   --   --   --   GFRNONAA 25*   < > 23* 24* 23*  GFRAA 28*   < > 26* 28* 27*  ANIONGAP 10   < > 12 11 13    < > = values in this interval not displayed.     Hematology Recent Labs  Lab 02/02/18 0335  02/03/18 0222 02/04/18 0329  WBC 7.3 5.2 4.8  RBC 3.29* 3.25* 3.39*  HGB 10.2* 10.5* 10.9*  HCT 35.2* 35.2* 37.1*  MCV 107.0* 108.3* 109.4*  MCH 31.0 32.3 32.2  MCHC 29.0* 29.8* 29.4*  RDW 17.2* 17.2* 17.1*  PLT 149* 158 158    Cardiac Enzymes Recent Labs  Lab 01/31/18 2206 02/01/18 0242 02/01/18 0904  TROPONINI 0.08* 0.07* 0.06*    Recent Labs  Lab 01/31/18 1234  TROPIPOC 0.08     BNP Recent Labs  Lab 01/31/18 1227  BNP 494.5*     DDimer No results for input(s): DDIMER in the last 168 hours.   Radiology    Dg Chest 2 View  Result Date: 02/02/2018 CLINICAL DATA:  Dyspnea. EXAM: CHEST - 2 VIEW COMPARISON:  Radiograph of January 31, 2018. FINDINGS: Stable cardiomegaly. Atherosclerosis of thoracic aorta is noted. No pneumothorax is noted. Left lung is clear. Moderate right pleural effusion is noted with probable underlying atelectasis or infiltrate. Bony thorax is unremarkable. IMPRESSION: Moderate right pleural effusion with probable underlying atelectasis or infiltrate. Aortic Atherosclerosis (ICD10-I70.0). Electronically Signed   By: Marijo Conception, M.D.   On: 02/02/2018 19:40    Cardiac Studies   Echocardiogram 02/02/2018: Study Conclusions:  - Left ventricle: The cavity size was normal. Wall thickness was increased in a pattern of severe LVH. Systolic function was normal. The estimated ejection fraction was in the range of 50% to 55%. Wall motion was normal; there were no regional wall motion abnormalities. The study was not technically sufficient to allow evaluation of LV diastolic dysfunction due to atrial fibrillation. - Ventricular septum: There is a D shaped septum during diastolic c/s RV volume overload. - Left atrium: The atrium was mildly dilated. - Right ventricle: The cavity size was mildly dilated. Wall thickness was normal. - Right atrium: The atrium was severely dilated. Central venous pressure (est): 15 mm Hg. -  Tricuspid valve: There was severe regurgitation. - Pulmonic valve: There was mild regurgitation. - Pulmonary arteries: PA peak pressure: 62 mm Hg (S).  Impressions: - The right ventricular systolic pressure was increased consistent with moderate pulmonary hypertension.  Patient Profile     ROYCE STEGMAN is a 77 y.o. male with a history of chronic diastolic CHF, atrial fibrillation/flutter on Coumadin, hypertension, type 2 diabetes mellitus, hypothyroidism, severe COPD on home O2, obstructive sleep apnea, and CKD IV, who is being seen today for the evaluation of acute CHF at the request of Dr. Karleen Hampshire.  Assessment & Plan    Acute Diastolic CHF - Patient presented with worsening shortness of breath and edema. - BNP elevated at 494.5. - Echo showed LVEF of 50-55% with severe LVH but no regional wall motion abnormality and elevated PASP of 62 mmHg with mildly dilated RV and severely dilated RA (central venous pressure 15 mmHg) consistent with moderate pulmonary hypertension. - Diuresing well. Documented output of 3.8 L in the past 24 hours with net negative 6.8 L since admission. Renal function elevated but at baseline. - Weight 231.9 lbs today, down 5 lbs since admission. Weights at last outpatient visit and at last discharge were 233 lbs.  - Patient has minimal crackles on exam and 1+ pitting edema bilaterally. Given continued shortness of breath, will continue IV Lasix at this time.  - Patient's INR has been therapeutic, so pulmonary embolism is unlikely. - Will check ABG given patient seemed a little confused this morning. Will also repeat chest x-ray to recheck pleural effusion. Previously need thoracentesis during admission in 11/2017. - I suspect patient's COPD is contributing to his shortness of breath. Consider consulting Pulmonology.  - Patient would benefit from a left/heart right catheterization given renal function do not know if that is an option at this time.  Atrial  Fibrillation - Telemetry shows atrial fibrillation with ventricular rates well controlled mostly in the 80's.  - Patient is not on any AV nodal agents at home. Previously on Amiodarone but this was stopped due to significant bradycardia while in atrial fibrillation.  - Bisoprolol 5mg  daily started yesterday for further rate control. May be able to up-titrate this. - Continue chronic anticoagulation with Coumadin per pharmacy.  Elevated Troponin / Demand Ischemia - EKG on admission showed atrial fibrillation with non-specific ST/T changes. - Troponin minimally elevated and flat at 0.08 >> 0.07 >> 0.06. Not consistent with ACS. Likely demand ischemia in the setting of acute CHF.  - Patient does report some chest tightness, lightheadedness, and vision changes with exertion. I suspect this may be due to his CHF but may need to consider ischemic evaluation. Last Myoview in 10/2016 showed no evidence of ischemia and was low risk.  Pulmonary Hypertension - Echo consistent with moderate pulmonary hypertension with PASP of 62 mmHg (47 mmHg in 08/2017) and increased right ventricular systolic pressure. - Already followed by outpatient Pulmonology for COPD.  Hypertension - BP currently well controlled at 111/85. - Continue current medications.   COPD - Per primary team.  CKD Stage IV - Serum creatinine 2.43 on admission. Baseline appears to be 2.0 to 2.6.  - Serum creatinine stable at 2.53 today. - Continue to monitor renal function and electrolytes while being diuresed.  - Followed by outpatient Nephrology.  For questions or updates, please contact [place] Please consult [email or web address] for contact info under        Signed, Darreld Mclean, PA-C  02/04/2018, 10:21 AM      Patient seen and examined. Agree with assessment and plan. I/0 -[id number] since admission. Breathing better.  AFib in the 70s, better controlled with initiation of bisoprolol 12/30. Cr 2.53 today. Recommend  resumption of CPAP Auto at night  while in hospital.  May ultimately need R heart cath.   Troy Sine, MD, [facility] 02/04/2018 7:45 PM

## 2018-02-05 DIAGNOSIS — I248 Other forms of acute ischemic heart disease: Secondary | ICD-10-CM

## 2018-02-05 DIAGNOSIS — N184 Chronic kidney disease, stage 4 (severe): Secondary | ICD-10-CM

## 2018-02-05 DIAGNOSIS — R4182 Altered mental status, unspecified: Secondary | ICD-10-CM

## 2018-02-05 DIAGNOSIS — I4811 Longstanding persistent atrial fibrillation: Secondary | ICD-10-CM

## 2018-02-05 LAB — BASIC METABOLIC PANEL
Anion gap: 10 (ref 5–15)
BUN: 65 mg/dL — ABNORMAL HIGH (ref 8–23)
CO2: 40 mmol/L — ABNORMAL HIGH (ref 22–32)
Calcium: 9.6 mg/dL (ref 8.9–10.3)
Chloride: 92 mmol/L — ABNORMAL LOW (ref 98–111)
Creatinine, Ser: 2.46 mg/dL — ABNORMAL HIGH (ref 0.61–1.24)
GFR calc Af Amer: 28 mL/min — ABNORMAL LOW (ref >60)
GFR calc non Af Amer: 24 mL/min — ABNORMAL LOW (ref >60)
Glucose, Bld: 91 mg/dL (ref 70–99)
Potassium: 4.1 mmol/L (ref 3.5–5.1)
SODIUM: 142 mmol/L (ref 135–145)

## 2018-02-05 LAB — CULTURE, BLOOD (ROUTINE X 2)
Culture: NO GROWTH
Culture: NO GROWTH
Special Requests: ADEQUATE
Special Requests: ADEQUATE

## 2018-02-05 LAB — GLUCOSE, CAPILLARY
Glucose-Capillary: 84 mg/dL (ref 70–99)
Glucose-Capillary: 92 mg/dL (ref 70–99)
Glucose-Capillary: 114 mg/dL — ABNORMAL HIGH (ref 70–99)
Glucose-Capillary: 97 mg/dL (ref 70–99)

## 2018-02-05 LAB — MAGNESIUM: Magnesium: 2.6 mg/dL — ABNORMAL HIGH (ref 1.7–2.4)

## 2018-02-05 LAB — PROTIME-INR
INR: 2.16
Prothrombin Time: 23.8 seconds — ABNORMAL HIGH (ref 11.4–15.2)

## 2018-02-05 MED ORDER — METOLAZONE 2.5 MG PO TABS
2.5000 mg | ORAL_TABLET | Freq: Once | ORAL | Status: AC
  Administered 2018-02-05: 2.5 mg via ORAL
  Filled 2018-02-05: qty 1

## 2018-02-05 MED ORDER — ZOLPIDEM TARTRATE 5 MG PO TABS
5.0000 mg | ORAL_TABLET | Freq: Once | ORAL | Status: AC
  Administered 2018-02-05: 5 mg via ORAL
  Filled 2018-02-05: qty 1

## 2018-02-05 NOTE — Plan of Care (Signed)
  Problem: Health Behavior/Discharge Planning: Goal: Ability to manage health-related needs will improve Outcome: Progressing   Problem: Clinical Measurements: Goal: Ability to maintain clinical measurements within normal limits will improve Outcome: Progressing Goal: Will remain free from infection Outcome: Progressing Goal: Respiratory complications will improve Outcome: Progressing   Problem: Nutrition: Goal: Adequate nutrition will be maintained Outcome: Progressing   Problem: Coping: Goal: Level of anxiety will decrease Outcome: Progressing   Problem: Elimination: Goal: Will not experience complications related to bowel motility Outcome: Progressing Goal: Will not experience complications related to urinary retention Outcome: Progressing   Problem: Pain Managment: Goal: General experience of comfort will improve Outcome: Progressing   Problem: Safety: Goal: Ability to remain free from injury will improve Outcome: Progressing   Problem: Cardiac: Goal: Ability to achieve and maintain adequate cardiopulmonary perfusion will improve Outcome: Progressing

## 2018-02-05 NOTE — Progress Notes (Signed)
PROGRESS NOTE    Omar Lambert  NTZ:[id number] DOB: 1940-12-03 DOA: 01/31/2018 PCP: Gayland Curry, DO    Brief Narrative: Omar Lambert is a 78 y.o. male with medical history significant of DM2, Hypothyroidism, HTN, Gout, GERD, COPD, CKD, diastolic CHF, asthma who presents for SOB and swelling. Pt admitted for acute on chronic diastolic heart failure.    Assessment & Plan:   Active Problems:   Benign essential hypertension   COPD exacerbation (HCC)   Esophageal reflux   Chronic kidney disease (CKD) stage G3b/A2, moderately decreased glomerular filtration rate (GFR) between 30-44 mL/min/1.73 square meter and albuminuria creatinine ratio between 30-299 mg/g (HCC)   Persistent atrial fibrillation   Hypothyroidism   Diabetes mellitus with renal complications (HCC)   Shortness of breath   Acute on chronic congestive heart failure (HCC)   Acute on chronic diastolic (congestive) heart failure (HCC)   Acute on chronic diastolic heart failure Admitted to stepdown .  Probably secondary to noncompliance to fluid and salt restriction. Patient continues to be fluid overloaded requiring IV Lasix 120 mg every 8 hours Continue with strict intake and output and daily weights and monitor renal parameters while on IV Lasix.    Repeat echocardiogram showed Wall thickness wasincreased in a pattern of severe LVH. Systolic function was normal. The estimated ejection fraction was in the range of 50%  to 55%. Wall motion was normal; there were no regional wall  motion abnormalities. The study was not technically sufficient to allow evaluation of LV diastolic dysfunction due to atrial fibrillation. Cardiology consulted. He will probably need RHC for further evaluation.  Use BiPAP as needed to keep sats greater than 90%. He has diuresed about 7.3 lit of since admission .  Further recommendations as per cardiology.    Elevated troponins probably from demand ischemia from acute CHF.    Essential  hypertension Well-controlled Continue home medication    Stage III chronic kidney disease Watch renal parameters while on IV diuretics. Creatinine at 2.46. it appears to be at baseline.     Persistent atrial fibrillation Rate controlled On Coumadin for anticoagulation. Dose Coumadin as per pharmacy. Therapeutic INR.    Type 2 diabetes mellitus with renal complications  CBG (last 3)  Recent Labs    02/04/18 2147 02/05/18 0838 02/05/18 1137  GLUCAP 103* 84 92   WELL controlled, continue with sliding scale insulin.    Hyperlipidemia Continue with Crestor    DVT prophylaxis: coumadin.  Code Status: full code.  Family Communication: none at bedside.  Disposition Plan:pending clinical improvement and cardiology recommendations.   Consultants:   None.    Procedures: Echocardiogram.      Antimicrobials: none.    Subjective: Breathing has improved,but pt is not back to baseline yet.   Objective: Vitals:   02/05/18 0700 02/05/18 0834 02/05/18 0848 02/05/18 1126  BP:  120/70  99/67  Pulse: 83 75  69  Resp:    17  Temp:  (!) 97.3 F (36.3 C)  98 F (36.7 C)  TempSrc:  Oral  Oral  SpO2:   (!) 72%   Weight:      Height:        Intake/Output Summary (Last 24 hours) at 02/05/2018 1510 Last data filed at 02/05/2018 1100 Gross per 24 hour  Intake 372.54 ml  Output 1325 ml  Net -952.46 ml   Filed Weights   02/03/18 0355 02/04/18 0300 02/05/18 0404  Weight: 105.7 kg 105.2 kg 103.4 kg  Examination:  General exam: alert and continues to require about 2 lit of [place] oxygen.  Respiratory system: bilateral rales, tachypnea present. Diminished at bases.  Cardiovascular system: S1 & S2 heard, irregular, JVD present.  Gastrointestinal system: Abdomen is soft NT ND BS+ Central nervous system: Alert and oriented.  Able to answer questions appropriately, grossly nonfocal Extremities:able to move all extremities.  3+ pedal edema present.  Skin: No rashes,  lesions or ulcers Psychiatry:  Mood & affect appropriate.     Data Reviewed: I have personally reviewed following labs and imaging studies  CBC: Recent Labs  Lab 01/31/18 1226 02/02/18 0335 02/03/18 0222 02/04/18 0329  WBC 4.5 7.3 5.2 4.8  NEUTROABS 3.4  --   --   --   HGB 10.3* 10.2* 10.5* 10.9*  HCT 36.5* 35.2* 35.2* 37.1*  MCV 112.0* 107.0* 108.3* 109.4*  PLT 156 149* 158 903   Basic Metabolic Panel: Recent Labs  Lab 02/01/18 0242 02/02/18 0335 02/03/18 0222 02/04/18 0329 02/05/18 0243  NA 143 141 145 141 142  K 4.8 4.5 4.0 4.2 4.1  CL 98 95* 96* 90* 92*  CO2 29 34* 38* 38* 40*  GLUCOSE 142* 97 104* 105* 91  BUN 45* 59* 61* 60* 65*  CREATININE 2.49* 2.58* 2.45* 2.53* 2.46*  CALCIUM 9.3 9.3 9.5 9.6 9.6  MG  --   --   --   --  2.6*   GFR: Estimated Creatinine Clearance: 29.3 mL/min (A) (by C-G formula based on SCr of 2.46 mg/dL (H)). Liver Function Tests: Recent Labs  Lab 01/31/18 1226  AST 34  ALT 18  ALKPHOS 142*  BILITOT 1.2  PROT 8.1  ALBUMIN 3.7   No results for input(s): LIPASE, AMYLASE in the last 168 hours. No results for input(s): AMMONIA in the last 168 hours. Coagulation Profile: Recent Labs  Lab 02/01/18 0242 02/02/18 0335 02/03/18 0222 02/04/18 0329 02/05/18 0243  INR 1.90 2.10 2.13 2.02 2.16   Cardiac Enzymes: Recent Labs  Lab 01/31/18 2206 02/01/18 0242 02/01/18 0904  TROPONINI 0.08* 0.07* 0.06*   BNP (last 3 results) No results for input(s): PROBNP in the last 8760 hours. HbA1C: No results for input(s): HGBA1C in the last 72 hours. CBG: Recent Labs  Lab 02/04/18 1204 02/04/18 1656 02/04/18 2147 02/05/18 0838 02/05/18 1137  GLUCAP 85 108* 103* 84 92   Lipid Profile: No results for input(s): CHOL, HDL, LDLCALC, TRIG, CHOLHDL, LDLDIRECT in the last 72 hours. Thyroid Function Tests: No results for input(s): TSH, T4TOTAL, FREET4, T3FREE, THYROIDAB in the last 72 hours. Anemia Panel: No results for input(s):  VITAMINB12, FOLATE, FERRITIN, TIBC, IRON, RETICCTPCT in the last 72 hours. Sepsis Labs: Recent Labs  Lab 01/31/18 1235 01/31/18 1452  LATICACIDVEN 3.40* 2.01*    Recent Results (from the past 240 hour(s))  Blood culture (routine x 2)     Status: None   Collection Time: 01/31/18 12:28 PM  Result Value Ref Range Status   Specimen Description BLOOD RIGHT ANTECUBITAL  Final   Special Requests   Final    BOTTLES DRAWN AEROBIC AND ANAEROBIC Blood Culture adequate volume   Culture   Final    NO GROWTH 5 DAYS Performed at [facility], [place] [address], [place], [place] [address]    Report Status 02/05/2018 FINAL  Final  Blood culture (routine x 2)     Status: None   Collection Time: 01/31/18  1:41 PM  Result Value Ref Range Status   Specimen Description BLOOD  LEFT ANTECUBITAL  Final   Special Requests   Final    BOTTLES DRAWN AEROBIC AND ANAEROBIC Blood Culture adequate volume   Culture   Final    NO GROWTH 5 DAYS Performed at [facility], [address] [place], [place] [address]    Report Status 02/05/2018 FINAL  Final  MRSA PCR Screening     Status: None   Collection Time: 01/31/18  9:38 PM  Result Value Ref Range Status   MRSA by PCR NEGATIVE NEGATIVE Final    Comment:        The GeneXpert MRSA Assay (FDA approved for NASAL specimens only), is one component of a comprehensive MRSA colonization surveillance program. It is not intended to diagnose MRSA infection nor to guide or monitor treatment for MRSA infections. Performed at [facility], [place] [address] [place], [place] [address]          Radiology Studies: Dg Chest 2 View  Result Date: 02/04/2018 CLINICAL DATA:  Shortness of breath EXAM: CHEST - 2 VIEW COMPARISON:  02/02/2018 FINDINGS: Cardiomegaly with vascular congestion. Large right pleural effusion with right lower lobe atelectasis or infiltrate. No focal opacity on the left. No acute bony abnormality. IMPRESSION: Continued large right  effusion with right lower lobe atelectasis or infiltrate. Cardiomegaly, vascular congestion. Electronically Signed   By: Rolm Baptise M.D.   On: 02/04/2018 13:15        Scheduled Meds: . allopurinol  300 mg Oral Daily  . bisoprolol  5 mg Oral Daily  . calcitRIOL  0.25 mcg Oral QODAY  . calcitRIOL  0.5 mcg Oral QODAY  . Influenza vac split quadrivalent PF  0.5 mL Intramuscular Tomorrow-1000  . isosorbide mononitrate  30 mg Oral Daily  . levothyroxine  175 mcg Oral QAC breakfast  . mouth rinse  15 mL Mouth Rinse BID  . multivitamin with minerals  1 tablet Oral Daily  . pantoprazole  40 mg Oral Daily  . polyethylene glycol  17 g Oral Daily  . potassium chloride SA  20 mEq Oral Daily  . rosuvastatin  20 mg Oral Daily  . senna-docusate  1 tablet Oral BID  . sodium chloride flush  3 mL Intravenous Q12H  . umeclidinium-vilanterol  1 puff Inhalation Daily   Continuous Infusions: . sodium chloride Stopped (02/04/18 0004)  . furosemide 120 mg (02/05/18 1333)     LOS: 5 days    Time spent:35  minutes.     Hosie Poisson, MD [facility] Pager [phone]  If 7PM-7AM, please contact night-coverage [email or web address] Password [email or web address] 02/05/2018, 3:10 PM

## 2018-02-05 NOTE — Progress Notes (Signed)
SATURATION QUALIFICATIONS: (This note is used to comply with regulatory documentation for home oxygen)  Patient Saturations on Room Air at Rest = 84%  Patient Saturations on Room Air while Ambulating = 83%  Patient Saturations on 2 Liters of oxygen while Ambulating = 92%  Please briefly explain why patient needs home oxygen:cannot tolerate  without o2 inhalation

## 2018-02-05 NOTE — Progress Notes (Signed)
Progress Note  Patient Name: Omar Lambert Date of Encounter: 02/05/2018  Primary Cardiologist: [name], MD  Subjective   Somnolent this morning.  Per his wife, patient was restless overnight and received Ambien.  He was able to wear CPAP all night.  When aroused, he denies chest pain and shortness of breath.  Inpatient Medications    Scheduled Meds: . allopurinol  300 mg Oral Daily  . bisoprolol  5 mg Oral Daily  . calcitRIOL  0.25 mcg Oral QODAY  . calcitRIOL  0.5 mcg Oral QODAY  . Influenza vac split quadrivalent PF  0.5 mL Intramuscular Tomorrow-1000  . isosorbide mononitrate  30 mg Oral Daily  . levothyroxine  175 mcg Oral QAC breakfast  . mouth rinse  15 mL Mouth Rinse BID  . multivitamin with minerals  1 tablet Oral Daily  . pantoprazole  40 mg Oral Daily  . polyethylene glycol  17 g Oral Daily  . potassium chloride SA  20 mEq Oral Daily  . rosuvastatin  20 mg Oral Daily  . senna-docusate  1 tablet Oral BID  . sodium chloride flush  3 mL Intravenous Q12H  . umeclidinium-vilanterol  1 puff Inhalation Daily  . Warfarin - Pharmacist Dosing Inpatient   Does not apply q1800   Continuous Infusions: . sodium chloride Stopped (02/04/18 0004)  . furosemide 120 mg (02/05/18 0549)   PRN Meds: sodium chloride, acetaminophen, albuterol, fluticasone, ondansetron (ZOFRAN) IV, sodium chloride flush   Vital Signs    Vitals:   02/05/18 0244 02/05/18 0404 02/05/18 0700 02/05/18 0834  BP: 108/74   120/70  Pulse: 67  83 75  Resp: 19     Temp: (!) 97.2 F (36.2 C)   (!) 97.3 F (36.3 C)  TempSrc: Oral   Oral  SpO2: 93%     Weight:  103.4 kg    Height:        Intake/Output Summary (Last 24 hours) at 02/05/2018 0844 Last data filed at 02/05/2018 0247 Gross per 24 hour  Intake 852.54 ml  Output 925 ml  Net -72.46 ml   Filed Weights   02/03/18 0355 02/04/18 0300 02/05/18 0404  Weight: 105.7 kg 105.2 kg 103.4 kg    Telemetry    Atrial fibrillation with PVC's  versus aberrancy.  Ventricular rate 60-80 bpm. - Personally Reviewed  ECG    No new tracing  Physical Exam   GEN: Asleep but arousable.  Somewhat drowsy during interview. Neck: Difficult to assess JVP due to body habitus; suspect it is at least mildly elevated. Cardiac: Irregularly irregular with 1/6 systolic murmur. Respiratory: Mildly diminished breath sounds at the lung bases and faint left basilar crackles.  Scattered bilateral wheezes. GI: Soft, nontender, non-distended  MS: 1+ pretibial edema bilaterally Neuro:  Nonfocal  Psych: Normal affect   Labs    Chemistry Recent Labs  Lab 01/31/18 1226  02/03/18 0222 02/04/18 0329 02/05/18 0243  NA 145   < > 145 141 142  K 4.1   < > 4.0 4.2 4.1  CL 99   < > 96* 90* 92*  CO2 36*   < > 38* 38* 40*  GLUCOSE 126*   < > 104* 105* 91  BUN 38*   < > 61* 60* 65*  CREATININE 2.43*   < > 2.45* 2.53* 2.46*  CALCIUM 9.8   < > 9.5 9.6 9.6  PROT 8.1  --   --   --   --   ALBUMIN 3.7  --   --   --   --  AST 34  --   --   --   --   ALT 18  --   --   --   --   ALKPHOS 142*  --   --   --   --   BILITOT 1.2  --   --   --   --   GFRNONAA 25*   < > 24* 23* 24*  GFRAA 28*   < > 28* 27* 28*  ANIONGAP 10   < > 11 13 10    < > = values in this interval not displayed.     Hematology Recent Labs  Lab 02/02/18 0335 02/03/18 0222 02/04/18 0329  WBC 7.3 5.2 4.8  RBC 3.29* 3.25* 3.39*  HGB 10.2* 10.5* 10.9*  HCT 35.2* 35.2* 37.1*  MCV 107.0* 108.3* 109.4*  MCH 31.0 32.3 32.2  MCHC 29.0* 29.8* 29.4*  RDW 17.2* 17.2* 17.1*  PLT 149* 158 158    Cardiac Enzymes Recent Labs  Lab 01/31/18 2206 02/01/18 0242 02/01/18 0904  TROPONINI 0.08* 0.07* 0.06*    Recent Labs  Lab 01/31/18 1234  TROPIPOC 0.08     BNP Recent Labs  Lab 01/31/18 1227  BNP 494.5*     DDimer No results for input(s): DDIMER in the last 168 hours.   Radiology    Dg Chest 2 View  Result Date: 02/04/2018 CLINICAL DATA:  Shortness of breath EXAM: CHEST - 2  VIEW COMPARISON:  02/02/2018 FINDINGS: Cardiomegaly with vascular congestion. Large right pleural effusion with right lower lobe atelectasis or infiltrate. No focal opacity on the left. No acute bony abnormality. IMPRESSION: Continued large right effusion with right lower lobe atelectasis or infiltrate. Cardiomegaly, vascular congestion. Electronically Signed   By: Rolm Baptise M.D.   On: 02/04/2018 13:15    Cardiac Studies   Echocardiogram 02/02/2018: Study Conclusions: - Left ventricle: The cavity size was normal. Wall thickness was increased in a pattern of severe LVH. Systolic function was normal. The estimated ejection fraction was in the range of 50% to 55%. Wall motion was normal; there were no regional wall motion abnormalities. The study was not technically sufficient to allow evaluation of LV diastolic dysfunction due to atrial fibrillation. - Ventricular septum: There is a D shaped septum during diastolic c/s RV volume overload. - Left atrium: The atrium was mildly dilated. - Right ventricle: The cavity size was mildly dilated. Wall thickness was normal. - Right atrium: The atrium was severely dilated. Central venous pressure (est): 15 mm Hg. - Tricuspid valve: There was severe regurgitation. - Pulmonic valve: There was mild regurgitation. - Pulmonary arteries: PA peak pressure: 62 mm Hg (S).  Impressions: - The right ventricular systolic pressure was increased consistent with moderate pulmonary hypertension.  Patient Profile     78 y.o. male with history of chronic diastolic CHF, atrial fibrillation/flutter onCoumadin, hypertension, type 2 diabetes mellitus, hypothyroidism, severe COPDon home O2,obstructive sleep apnea,and CKD IV, who is being seen today for the evaluation ofacute CHF.  Assessment & Plan    HFpEF Difficult to assess symptoms due to somnolence.  I suspect he is still volume overloaded based on exam.  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  Infiltrative process such as amyloidosis is a consideration (severe LVH with low voltage on EKG).  Ideally, would consider cardiac MRI w/wo contrast, but his altered mental status makes this difficult.  Additionally, I would be hesitant to administer contrast in the setting of advanced CKD.  Patient essential net even over last  24 hours despite high-dose furosemide.  Continue furosemide 120 mg Q8 hours.  Administer metolazone 2.5 mg x 1 today.  Consider right +/- left heart catheterization for further evaluation; I would like to optimize volume status and renal function before proceeding.  We may need to forgo coronary angiography if renal function does not improve.  CKD stage IV Creatinine stable.  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  Diuresis, as above, with close monitoring of renal function.  Consider further w/u for amyloidosis; will defer this to IM and nephrology.  Atrial fibrillation Heart rate adequately controlled.  INR at lower Miranda Garber of therapeutic range today at 2.2.  Discontinue warfarin in anticipation of possible cardiac catheterization during this admission.  Plan to initiate heparin infusion when INR falls below 2.  Continue bisoprolol.  Demand ischemia Minimal troponin elevation noted on admission, most likely due to supply-demand mismatch in the setting of acute HFpEF and CKD.  Defer ischemia evaluation at this time; ideally would perform coronary angiography with [facility], though risk for CIN is high in the setting of CKD and acute heart failure.  Continue isosorbide mononitrate.  Altered mental status Patient somnolent again this morning, though able to follow commands and respond to some questions.  ABG yesterday notable for pCO2 of 70 with normal pH suggestive of chronic CO2 retention.  Administration of Ambien last night likely exacerbated AMS.  Continue nightly CPAP use.   Avoid ambien, benzodiazepines, and other sedatives, if possible.  Further management per IM.  For questions or updates, please contact [place] Please consult [email or web address] for contact info under   Signed, Nelva Bush, MD  02/05/2018, 8:44 AM

## 2018-02-06 DIAGNOSIS — G4733 Obstructive sleep apnea (adult) (pediatric): Secondary | ICD-10-CM

## 2018-02-06 DIAGNOSIS — E039 Hypothyroidism, unspecified: Secondary | ICD-10-CM

## 2018-02-06 DIAGNOSIS — Z9989 Dependence on other enabling machines and devices: Secondary | ICD-10-CM

## 2018-02-06 LAB — BASIC METABOLIC PANEL
Anion gap: 10 (ref 5–15)
BUN: 75 mg/dL — AB (ref 8–23)
CO2: 39 mmol/L — ABNORMAL HIGH (ref 22–32)
Calcium: 9.6 mg/dL (ref 8.9–10.3)
Chloride: 93 mmol/L — ABNORMAL LOW (ref 98–111)
Creatinine, Ser: 2.78 mg/dL — ABNORMAL HIGH (ref 0.61–1.24)
GFR calc Af Amer: 24 mL/min — ABNORMAL LOW (ref >60)
GFR calc non Af Amer: 21 mL/min — ABNORMAL LOW (ref >60)
GLUCOSE: 99 mg/dL (ref 70–99)
Potassium: 4.6 mmol/L (ref 3.5–5.1)
Sodium: 142 mmol/L (ref 135–145)

## 2018-02-06 LAB — PROTIME-INR
INR: 1.98
PROTHROMBIN TIME: 22.2 s — AB (ref 11.4–15.2)

## 2018-02-06 LAB — HEPARIN LEVEL (UNFRACTIONATED): Heparin Unfractionated: 0.28 IU/mL — ABNORMAL LOW (ref 0.30–0.70)

## 2018-02-06 MED ORDER — HEPARIN (PORCINE) 25000 UT/250ML-% IV SOLN
1150.0000 [IU]/h | INTRAVENOUS | Status: DC
  Administered 2018-02-06: 1000 [IU]/h via INTRAVENOUS
  Filled 2018-02-06 (×2): qty 250

## 2018-02-06 NOTE — Progress Notes (Signed)
ANTICOAGULATION CONSULT NOTE - Follow up [facility] for warfarin Indication: atrial fibrillation  No Known Allergies  Patient Measurements: Height: 5\' 9"  (175.3 cm) Weight: 222 lb 0.1 oz (100.7 kg) IBW/kg (Calculated) : 70.7  Vital Signs: Temp: 97.9 F (36.6 C) (01/02 0723) Temp Source: Oral (01/02 0723) BP: 124/92 (01/02 0727) Pulse Rate: 64 (01/02 0727)  Labs: Recent Labs    02/04/18 0329 02/05/18 0243 02/06/18 0254  HGB 10.9*  --   --   HCT 37.1*  --   --   PLT 158  --   --   LABPROT 22.6* 23.8* 22.2*  INR 2.02 2.16 1.98  CREATININE 2.53* 2.46* 2.78*    Estimated Creatinine Clearance: 25.6 mL/min (A) (by C-G formula based on SCr of 2.78 mg/dL (H)).   Medical History: Past Medical History:  Diagnosis Date  . Acute kidney injury superimposed on chronic kidney disease ([place]) 11/2016  . Acute respiratory failure ([place]) 11/2016  . Allergy   . Asthma   . Atrial fibrillation ([place])   . 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 Benign essential hypertension   . CHF (congestive heart failure) ([place])   . CKD (chronic kidney disease), stage III ([place])   . COPD (chronic obstructive pulmonary disease) ([place])   . Diabetes ([place])   . Diabetes mellitus without complication ([place])   . Dyspnea   . Gastritis   . GERD (gastroesophageal reflux disease)   . Gout attack 09/2012  . Hypertension   . Sinusitis, maxillary, chronic   . Thyroid disease   . Type II or unspecified type diabetes mellitus without mention of complication, not stated as uncontrolled     Assessment: 36 yoM admitted with SOB and swelling. Pt on warfarin at home for AFib, and pharmacy to start heparin when INR < 2.0. Plans for RHC/LHC -INR= 1.98  Home dose: 2.5mg  daily except for 5mg  on Mon/Fri  Goal of Therapy:  INR 2-3 Monitor platelets by anticoagulation protocol: Yes   Plan:  -No heparin bolus -Start heparin at 1000 units/hr -Heparin level in 8 hours and daily wth CBC  daily  Hildred Laser, PharmD Clinical Pharmacist **Pharmacist phone directory can now be found on [email or web address] (PW [email or web address]).  Listed under [place].

## 2018-02-06 NOTE — Progress Notes (Signed)
[facility] returned page, updated on fall. No c/o pain. Neuro intact, VSS. Will continue to observe.

## 2018-02-06 NOTE — Progress Notes (Addendum)
Progress Note  Patient Name: Omar Lambert Date of Encounter: 02/06/2018  Primary Cardiologist: [name], MD   Subjective   Seems AMS improved. No chest pain or dyspnea. Wife is asking about tremors.   Inpatient Medications    Scheduled Meds: . allopurinol  300 mg Oral Daily  . bisoprolol  5 mg Oral Daily  . calcitRIOL  0.25 mcg Oral QODAY  . calcitRIOL  0.5 mcg Oral QODAY  . Influenza vac split quadrivalent PF  0.5 mL Intramuscular Tomorrow-1000  . isosorbide mononitrate  30 mg Oral Daily  . levothyroxine  175 mcg Oral QAC breakfast  . mouth rinse  15 mL Mouth Rinse BID  . multivitamin with minerals  1 tablet Oral Daily  . pantoprazole  40 mg Oral Daily  . polyethylene glycol  17 g Oral Daily  . potassium chloride SA  20 mEq Oral Daily  . rosuvastatin  20 mg Oral Daily  . senna-docusate  1 tablet Oral BID  . sodium chloride flush  3 mL Intravenous Q12H  . umeclidinium-vilanterol  1 puff Inhalation Daily   Continuous Infusions: . sodium chloride Stopped (02/04/18 0004)  . furosemide 120 mg (02/06/18 0629)   PRN Meds: sodium chloride, acetaminophen, albuterol, fluticasone, ondansetron (ZOFRAN) IV, sodium chloride flush   Vital Signs    Vitals:   02/06/18 0300 02/06/18 0500 02/06/18 0723 02/06/18 0727  BP: 140/83  (!) 124/92 (!) 124/92  Pulse: 72  62 64  Resp: (!) 24  (!) 26 20  Temp: 98.7 F (37.1 C)  97.9 F (36.6 C)   TempSrc: Oral  Oral   SpO2: 98%  95% 96%  Weight:  100.7 kg    Height:        Intake/Output Summary (Last 24 hours) at 02/06/2018 0802 Last data filed at 02/06/2018 1610 Gross per 24 hour  Intake 644.74 ml  Output 1850 ml  Net -1205.26 ml   Filed Weights   02/04/18 0300 02/05/18 0404 02/06/18 0500  Weight: 105.2 kg 103.4 kg 100.7 kg    Telemetry    Atrial fibrillation at rate of 60s - Personally Reviewed  ECG    N/A  Physical Exam   GEN: No acute distress.   Neck: No JVD Cardiac: RRR, no murmurs, rubs, or gallops.    Respiratory: faint rales GI: Soft, nontender, non-distended  MS: Trace edema; No deformity. Neuro:  Nonfocal  Psych: Normal affect   Labs    Chemistry Recent Labs  Lab 01/31/18 1226  02/04/18 0329 02/05/18 0243 02/06/18 0254  NA 145   < > 141 142 142  K 4.1   < > 4.2 4.1 4.6  CL 99   < > 90* 92* 93*  CO2 36*   < > 38* 40* 39*  GLUCOSE 126*   < > 105* 91 99  BUN 38*   < > 60* 65* 75*  CREATININE 2.43*   < > 2.53* 2.46* 2.78*  CALCIUM 9.8   < > 9.6 9.6 9.6  PROT 8.1  --   --   --   --   ALBUMIN 3.7  --   --   --   --   AST 34  --   --   --   --   ALT 18  --   --   --   --   ALKPHOS 142*  --   --   --   --   BILITOT 1.2  --   --   --   --  GFRNONAA 25*   < > 23* 24* 21*  GFRAA 28*   < > 27* 28* 24*  ANIONGAP 10   < > 13 10 10    < > = values in this interval not displayed.     Hematology Recent Labs  Lab 02/02/18 0335 02/03/18 0222 02/04/18 0329  WBC 7.3 5.2 4.8  RBC 3.29* 3.25* 3.39*  HGB 10.2* 10.5* 10.9*  HCT 35.2* 35.2* 37.1*  MCV 107.0* 108.3* 109.4*  MCH 31.0 32.3 32.2  MCHC 29.0* 29.8* 29.4*  RDW 17.2* 17.2* 17.1*  PLT 149* 158 158    Cardiac Enzymes Recent Labs  Lab 01/31/18 2206 02/01/18 0242 02/01/18 0904  TROPONINI 0.08* 0.07* 0.06*    Recent Labs  Lab 01/31/18 1234  TROPIPOC 0.08     BNP Recent Labs  Lab 01/31/18 1227  BNP 494.5*     Radiology    Dg Chest 2 View  Result Date: 02/04/2018 CLINICAL DATA:  Shortness of breath EXAM: CHEST - 2 VIEW COMPARISON:  02/02/2018 FINDINGS: Cardiomegaly with vascular congestion. Large right pleural effusion with right lower lobe atelectasis or infiltrate. No focal opacity on the left. No acute bony abnormality. IMPRESSION: Continued large right effusion with right lower lobe atelectasis or infiltrate. Cardiomegaly, vascular congestion. Electronically Signed   By: Rolm Baptise M.D.   On: 02/04/2018 13:15    Cardiac Studies   Echocardiogram 02/02/2018: Study Conclusions: - Left  ventricle: The cavity size was normal. Wall thickness was increased in a pattern of severe LVH. Systolic function was normal. The estimated ejection fraction was in the range of 50% to 55%. Wall motion was normal; there were no regional wall motion abnormalities. The study was not technically sufficient to allow evaluation of LV diastolic dysfunction due to atrial fibrillation. - Ventricular septum: There is a D shaped septum during diastolic c/s RV volume overload. - Left atrium: The atrium was mildly dilated. - Right ventricle: The cavity size was mildly dilated. Wall thickness was normal. - Right atrium: The atrium was severely dilated. Central venous pressure (est): 15 mm Hg. - Tricuspid valve: There was severe regurgitation. - Pulmonic valve: There was mild regurgitation. - Pulmonary arteries: PA peak pressure: 62 mm Hg (S).  Impressions: - The right ventricular systolic pressure was increased consistent with moderate pulmonary hypertension.  Patient Profile     78 y.o. 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  Assessment & Plan    1. Acute diastolic CHF - Per Dr. 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  Infiltrative process such as amyloidosis is a consideration (severe LVH with low voltage on EKG).  Ideally, would consider cardiac MRI w/wo contrast, but his altered mental status makes this difficult.  Additionally, I would be hesitant to administer contrast in the setting of advanced CKD. Consider right +/- left heart catheterization for further evaluation; I would like to optimize volume status and renal function before proceeding". - Diuresed total 8.1L with 1.2L yesterday. 14L weight loss. BUN/Scr 75/2.78 today from  65/2.46 yesterday. He is getting IV lasix 120mg  q 8 hours since admission. Got Metolazone 2.5mg  yesterday. Diuretics per MD. He already got IV lasix 120mg  this morning. Seems close to euvolemic.   2. CKD stage IV - Seems baseline Scr of 2.4-2.6. as above  3. Elevated troponin  - Flat trend around 0.06-0.08. Likely demand.  -  Continue Imdur and Crestor   4. Atrial fibrillation - Warfarin on hold for anticipation of cath. Now on heparin. INR 1.98.  Timing of cath per Dr. Claiborne Billings. However scr of 2.78 today. ? Only RHC.  - Continue Bisoprolol  5. AMS - Per primary  - Seems improved  6. Resting tremors - Per primary    For questions or updates, please contact [facility] Please consult [email or web address] for contact info under        SignedLeanor Kail, PA  02/06/2018, 8:02 AM     Patient seen and examined. Agree with assessment and plan. I/O since admission -8035.  Cr increased today at 2.78.  Breathing better.  Previous tense lower extremity edema is now much improved.  Agree with possible future work-up for infiltrative myocardial process such as amyloidosis.  Consider future technetium pyrophosphate imaging.  With creatinine 2.78 would not undertake left heart catheterization at this time due to contrast mediated nephrotoxicity.  However, with significant pulmonary hypertension previously documented on echo and RV volume overload will tentatively set up for right heart catheterization tomorrow for more accurate assessment of volume status.  Will  keep n.p.o. in a.m. but it has been on hold.  INR 1.98 today.  Recheck INR in a.m.  Continue heparin with plans for discontinuance several hours prior to right heart catheterization.  Patient is now back resuming nocturnal CPAP in  Hospital. With MCV 109, check B12 and folate levels.   Troy Sine, MD, [facility] 02/06/2018 12:29 PM

## 2018-02-06 NOTE — Progress Notes (Addendum)
Found patient on the floor, patient denied that he didn't hit his head on the floor as well as no pain any where. Checked his skin no ecchymosis on his back or legs. There is no sign of bleeding any where his body. Patient stated that he was trying to fix the chair then the chair was moving backward and he slipped down. Call bell was next to him, but he didn't call for help. Paged Dr. Karleen Hampshire, but no returned call.  Paged Night on call doctor three times, but no returned call. Informed patient's wife regarding this event. Night shift nurse made ware of this and She will page Dr. Levada Schilling again. [employer]

## 2018-02-06 NOTE — Progress Notes (Signed)
ANTICOAGULATION CONSULT NOTE - Follow up Helen for heparin Indication: atrial fibrillation  No Known Allergies  Patient Measurements: Height: 5\' 9"  (175.3 cm) Weight: 222 lb 0.1 oz (100.7 kg) IBW/kg (Calculated) : 70.7  Vital Signs: Temp: 98 F (36.7 C) (01/02 1639) Temp Source: Oral (01/02 1639) BP: 118/97 (01/02 1639) Pulse Rate: 63 (01/02 1639)  Labs: Recent Labs    02/04/18 0329 02/05/18 0243 02/06/18 0254 02/06/18 1620  HGB 10.9*  --   --   --   HCT 37.1*  --   --   --   PLT 158  --   --   --   LABPROT 22.6* 23.8* 22.2*  --   INR 2.02 2.16 1.98  --   HEPARINUNFRC  --   --   --  0.28*  CREATININE 2.53* 2.46* 2.78*  --     Estimated Creatinine Clearance: 25.6 mL/min (A) (by C-G formula based on SCr of 2.78 mg/dL (H)).   Medical History: Past Medical History:  Diagnosis Date  . Acute kidney injury superimposed on chronic kidney disease ([place]) 11/2016  . Acute respiratory failure ([place]) 11/2016  . Allergy   . Asthma   . Atrial fibrillation ([place])   . 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 Benign essential hypertension   . CHF (congestive heart failure) ([place])   . CKD (chronic kidney disease), stage III ([place])   . COPD (chronic obstructive pulmonary disease) ([place])   . Diabetes ([place])   . Diabetes mellitus without complication ([place])   . Dyspnea   . Gastritis   . GERD (gastroesophageal reflux disease)   . Gout attack 09/2012  . Hypertension   . Sinusitis, maxillary, chronic   . Thyroid disease   . Type II or unspecified type diabetes mellitus without mention of complication, not stated as uncontrolled     Assessment: 3 yoM admitted with SOB and swelling. Pt on warfarin at home for AFib, and pharmacy to start heparin when INR < 2.0. Plans for RHC/LHC -INR= 1.98  Heparin started this morning, initial heparin level just below goal at 0.28. Will not bolus given INR, will make slight rate adjustment. No bleeding or IV issues  noted.   Home dose: 2.5mg  daily except for 5mg  on Mon/Fri  Goal of Therapy:  INR 2-3 Monitor platelets by anticoagulation protocol: Yes   Plan:  -No heparin bolus -Increase heparin to 1150 units/hr -Heparin level daily wth CBC   Erin Hearing PharmD., BCPS Clinical Pharmacist 02/06/2018 5:34 PM

## 2018-02-06 NOTE — Progress Notes (Signed)
Pt refusing CPAP for the night. RT will continue to monitor as needed.  

## 2018-02-06 NOTE — Progress Notes (Signed)
Physical Therapy Treatment Patient Details Name: Omar Lambert MRN: [id number] DOB: 12/08/1940 Today's Date: 02/06/2018    History of Present Illness Omar Silvera Wilsonis a 78 y.o.malewith medical history significant ofDM2, Hypothyroidism, HTN, Gout, GERD, COPD, CKD, diastolic CHF, asthma who presents for SOB and swelling. Pt admitted for acute on chronic diastolic heart failure.     PT Comments    Patient progressing well towards his physical therapy goals with encouragement. Increased ambulation distance to 250 feet with use of Rollator. VSS on 2L O2. Pt requires increased assistance for turns and negotiating tight spaces/obstacles. Of note, pt is also hypophonic and requires increased time to respond and cues for problem solving and sequencing. Updated equipment recommendation to Rollator for discharge home.     Follow Up Recommendations  Home health PT     Equipment Recommendations  Other (comment)(rollator)    Recommendations for Other Services       Precautions / Restrictions Precautions Precaution Comments: watch 02 saturations Restrictions Weight Bearing Restrictions: No    Mobility  Bed Mobility               General bed mobility comments: Pt up in chair  Transfers Overall transfer level: Modified independent Equipment used: None             General transfer comment: from recliner  Ambulation/Gait Ambulation/Gait assistance: Supervision;Min guard Gait Distance (Feet): 250 Feet Assistive device: 4-wheeled walker Gait Pattern/deviations: Step-through pattern;Decreased stride length Gait velocity: decreased   General Gait Details: requiring min guard assist for negotiating tight spaces/obstacles but able to progress to supervision with open spaces.    Stairs             Wheelchair Mobility    Modified Rankin (Stroke Patients Only)       Balance Overall balance assessment: Mild deficits observed, not formally tested;History of Falls                                           Cognition Arousal/Alertness: Awake/alert Behavior During Therapy: WFL for tasks assessed/performed Overall Cognitive Status: History of cognitive impairments - at baseline                                        Exercises      General Comments General comments (skin integrity, edema, etc.): VSS      Pertinent Vitals/Pain Pain Assessment: No/denies pain    Home Living                      Prior Function            PT Goals (current goals can now be found in the care plan section) Acute Rehab PT Goals Patient Stated Goal: none stated; agreeable to participate PT Goal Formulation: With patient/family Time For Goal Achievement: 02/15/18 Potential to Achieve Goals: Good Progress towards PT goals: Progressing toward goals    Frequency    Min 2X/week      PT Plan Current plan remains appropriate    Co-evaluation              AM-PAC PT "6 Clicks" Mobility   Outcome Measure  Help needed turning from your back to your side while in a flat bed without using bedrails?: None Help needed  moving from lying on your back to sitting on the side of a flat bed without using bedrails?: None Help needed moving to and from a bed to a chair (including a wheelchair)?: A Little Help needed standing up from a chair using your arms (e.g., wheelchair or bedside chair)?: None Help needed to walk in hospital room?: A Little Help needed climbing 3-5 steps with a railing? : A Lot 6 Click Score: 20    End of Session Equipment Utilized During Treatment: Oxygen Activity Tolerance: Patient tolerated treatment well Patient left: with call bell/phone within reach;in chair(sitting EOB) Nurse Communication: Mobility status PT Visit Diagnosis: Difficulty in walking, not elsewhere classified (R26.2)     Time: 1535-1610 PT Time Calculation (min) (ACUTE ONLY): 35 min  Charges:  $Therapeutic Activity: 23-37  mins                     Ellamae Sia, PT, DPT Acute Rehabilitation Services Pager [phone] Office [phone]    Willy Eddy 02/06/2018, 4:28 PM

## 2018-02-06 NOTE — Progress Notes (Signed)
PROGRESS NOTE    [name]  ENM:[id number] DOB: 09-28-1940 DOA: 01/31/2018 PCP: Omar Curry, DO    Brief Narrative: Omar Lambert is a 78 y.o. male with medical history significant of DM2, Hypothyroidism, HTN, Gout, GERD, COPD, CKD, diastolic CHF, asthma who presents for SOB and swelling. Pt admitted for acute on chronic diastolic heart failure.    Assessment & Plan:   Active Problems:   Benign essential hypertension   COPD exacerbation (HCC)   Esophageal reflux   Chronic kidney disease (CKD) stage G3b/A2, moderately decreased glomerular filtration rate (GFR) between 30-44 mL/min/1.73 square meter and albuminuria creatinine ratio between 30-299 mg/g (HCC)   Persistent atrial fibrillation   Hypothyroidism   Diabetes mellitus with renal complications (HCC)   Shortness of breath   Acute on chronic congestive heart failure (HCC)   Acute on chronic diastolic (congestive) heart failure (HCC)   Acute on chronic diastolic heart failure Admitted to stepdown .  Probably secondary to noncompliance to fluid and salt restriction. Patient continues to be fluid overloaded requiring IV Lasix 120 mg every 8 hours Continue with strict intake and output and daily weights and monitor renal parameters while on IV Lasix.    Repeat echocardiogram showed Wall thickness wasincreased in a pattern of severe LVH. Systolic function was normal. The estimated ejection fraction was in the range of 50%  to 55%. Wall motion was normal; there were no regional wall  motion abnormalities. The study was not technically sufficient to allow evaluation of LV diastolic dysfunction due to atrial fibrillation. Cardiology consulted. He will probably need RHC for further evaluation.  Use BiPAP as needed to keep sats greater than 90%. He has diuresed abou t8  lit of since admission .  Further recommendations as per cardiology.    Elevated troponins probably from demand ischemia from acute CHF.    Essential  hypertension Well-controlled Continue home medication    Mild acute on Stage III chronic kidney disease Watch renal parameters while on IV diuretics. Creatinine at 2.78, slightly elevated, probably from diuresis.  Continue to monitor.     Persistent atrial fibrillation Rate controlled On Coumadin for anticoagulation. Dose Coumadin as per pharmacy.    Type 2 diabetes mellitus with renal complications  CBG (last 3)  Recent Labs    02/05/18 1137 02/05/18 1633 02/05/18 2127  GLUCAP 92 114* 97   WELL controlled, continue with sliding scale insulin. No changes in medications.     Hyperlipidemia Continue with Crestor    DVT prophylaxis: coumadin.  Code Status: full code.  Family Communication: none at bedside.  Disposition Plan:pending clinical improvement and cardiology recommendations.   Consultants:  Cardiology.    Procedures: Echocardiogram.      Antimicrobials: none.    Subjective: Continues to require about 2 lit of [place] oxygen.  No chest pain   Objective: Vitals:   02/06/18 0500 02/06/18 0723 02/06/18 0727 02/06/18 1124  BP:  (!) 124/92 (!) 124/92 106/69  Pulse:  62 64 63  Resp:  (!) 26 20 (!) 26  Temp:  97.9 F (36.6 C)  97.8 F (36.6 C)  TempSrc:  Oral  Oral  SpO2:  95% 96% 93%  Weight: 100.7 kg     Height:        Intake/Output Summary (Last 24 hours) at 02/06/2018 1156 Last data filed at 02/06/2018 0907 Gross per 24 hour  Intake 767.74 ml  Output 1450 ml  Net -682.26 ml   Filed Weights   02/04/18 0300  02/05/18 0404 02/06/18 0500  Weight: 105.2 kg 103.4 kg 100.7 kg    Examination:  General exam: alert and continues to require about 2 lit of [place] oxygen. Tremulous.  Respiratory system: diminished at bases, no wheezing or rhonchi.  Cardiovascular system: S1 & S2 heard, irregular, JVD present.  Gastrointestinal system: Abdomen is soft non tender non distended bowel sounds good.  Central nervous system: Alert and oriented.  Able to  answer questions appropriately, grossly nonfocal Extremities:able to move all extremities.  2+ pedal edema present.  Skin: No rashes, lesions or ulcers Psychiatry:  Mood & affect appropriate.     Data Reviewed: I have personally reviewed following labs and imaging studies  CBC: Recent Labs  Lab 01/31/18 1226 02/02/18 0335 02/03/18 0222 02/04/18 0329  WBC 4.5 7.3 5.2 4.8  NEUTROABS 3.4  --   --   --   HGB 10.3* 10.2* 10.5* 10.9*  HCT 36.5* 35.2* 35.2* 37.1*  MCV 112.0* 107.0* 108.3* 109.4*  PLT 156 149* 158 259   Basic Metabolic Panel: Recent Labs  Lab 02/02/18 0335 02/03/18 0222 02/04/18 0329 02/05/18 0243 02/06/18 0254  NA 141 145 141 142 142  K 4.5 4.0 4.2 4.1 4.6  CL 95* 96* 90* 92* 93*  CO2 34* 38* 38* 40* 39*  GLUCOSE 97 104* 105* 91 99  BUN 59* 61* 60* 65* 75*  CREATININE 2.58* 2.45* 2.53* 2.46* 2.78*  CALCIUM 9.3 9.5 9.6 9.6 9.6  MG  --   --   --  2.6*  --    GFR: Estimated Creatinine Clearance: 25.6 mL/min (A) (by C-G formula based on SCr of 2.78 mg/dL (H)). Liver Function Tests: Recent Labs  Lab 01/31/18 1226  AST 34  ALT 18  ALKPHOS 142*  BILITOT 1.2  PROT 8.1  ALBUMIN 3.7   No results for input(s): LIPASE, AMYLASE in the last 168 hours. No results for input(s): AMMONIA in the last 168 hours. Coagulation Profile: Recent Labs  Lab 02/02/18 0335 02/03/18 0222 02/04/18 0329 02/05/18 0243 02/06/18 0254  INR 2.10 2.13 2.02 2.16 1.98   Cardiac Enzymes: Recent Labs  Lab 01/31/18 2206 02/01/18 0242 02/01/18 0904  TROPONINI 0.08* 0.07* 0.06*   BNP (last 3 results) No results for input(s): PROBNP in the last 8760 hours. HbA1C: No results for input(s): HGBA1C in the last 72 hours. CBG: Recent Labs  Lab 02/04/18 2147 02/05/18 0838 02/05/18 1137 02/05/18 1633 02/05/18 2127  GLUCAP 103* 84 92 114* 97   Lipid Profile: No results for input(s): CHOL, HDL, LDLCALC, TRIG, CHOLHDL, LDLDIRECT in the last 72 hours. Thyroid Function  Tests: No results for input(s): TSH, T4TOTAL, FREET4, T3FREE, THYROIDAB in the last 72 hours. Anemia Panel: No results for input(s): VITAMINB12, FOLATE, FERRITIN, TIBC, IRON, RETICCTPCT in the last 72 hours. Sepsis Labs: Recent Labs  Lab 01/31/18 1235 01/31/18 1452  LATICACIDVEN 3.40* 2.01*    Recent Results (from the past 240 hour(s))  Blood culture (routine x 2)     Status: None   Collection Time: 01/31/18 12:28 PM  Result Value Ref Range Status   Specimen Description BLOOD RIGHT ANTECUBITAL  Final   Special Requests   Final    BOTTLES DRAWN AEROBIC AND ANAEROBIC Blood Culture adequate volume   Culture   Final    NO GROWTH 5 DAYS Performed at [facility], [place] [address] [place], [place] [address]    Report Status 02/05/2018 FINAL  Final  Blood culture (routine x 2)  Status: None   Collection Time: 01/31/18  1:41 PM  Result Value Ref Range Status   Specimen Description BLOOD LEFT ANTECUBITAL  Final   Special Requests   Final    BOTTLES DRAWN AEROBIC AND ANAEROBIC Blood Culture adequate volume   Culture   Final    NO GROWTH 5 DAYS Performed at [facility], [address] [place], [place] [address]    Report Status 02/05/2018 FINAL  Final  MRSA PCR Screening     Status: None   Collection Time: 01/31/18  9:38 PM  Result Value Ref Range Status   MRSA by PCR NEGATIVE NEGATIVE Final    Comment:        The GeneXpert MRSA Assay (FDA approved for NASAL specimens only), is one component of a comprehensive MRSA colonization surveillance program. It is not intended to diagnose MRSA infection nor to guide or monitor treatment for MRSA infections. Performed at [facility], [place] [address] [place], [place] [address]          Radiology Studies: Dg Chest 2 View  Result Date: 02/04/2018 CLINICAL DATA:  Shortness of breath EXAM: CHEST - 2 VIEW COMPARISON:  02/02/2018 FINDINGS: Cardiomegaly with vascular congestion. Large right pleural effusion with  right lower lobe atelectasis or infiltrate. No focal opacity on the left. No acute bony abnormality. IMPRESSION: Continued large right effusion with right lower lobe atelectasis or infiltrate. Cardiomegaly, vascular congestion. Electronically Signed   By: Rolm Baptise M.D.   On: 02/04/2018 13:15        Scheduled Meds: . allopurinol  300 mg Oral Daily  . bisoprolol  5 mg Oral Daily  . calcitRIOL  0.25 mcg Oral QODAY  . calcitRIOL  0.5 mcg Oral QODAY  . Influenza vac split quadrivalent PF  0.5 mL Intramuscular Tomorrow-1000  . isosorbide mononitrate  30 mg Oral Daily  . levothyroxine  175 mcg Oral QAC breakfast  . mouth rinse  15 mL Mouth Rinse BID  . multivitamin with minerals  1 tablet Oral Daily  . pantoprazole  40 mg Oral Daily  . polyethylene glycol  17 g Oral Daily  . potassium chloride SA  20 mEq Oral Daily  . rosuvastatin  20 mg Oral Daily  . senna-docusate  1 tablet Oral BID  . sodium chloride flush  3 mL Intravenous Q12H  . umeclidinium-vilanterol  1 puff Inhalation Daily   Continuous Infusions: . sodium chloride Stopped (02/04/18 0004)  . furosemide 120 mg (02/06/18 0629)  . heparin 1,000 Units/hr (02/06/18 0914)     LOS: 6 days    Time spent:35  minutes.     Hosie Poisson, MD [facility] Pager [phone]  If 7PM-7AM, please contact night-coverage [email or web address] Password [facility] 02/06/2018, 11:56 AM

## 2018-02-07 ENCOUNTER — Inpatient Hospital Stay (HOSPITAL_COMMUNITY): Payer: Medicare HMO

## 2018-02-07 ENCOUNTER — Ambulatory Visit: Payer: Medicare HMO | Admitting: Physician Assistant

## 2018-02-07 ENCOUNTER — Encounter (HOSPITAL_COMMUNITY)
Admission: EM | Disposition: A | Discharge status: Hospice | Payer: Self-pay | Source: Home / Self Care | Attending: Internal Medicine

## 2018-02-07 ENCOUNTER — Encounter (HOSPITAL_COMMUNITY): Payer: Self-pay | Admitting: Interventional Cardiology

## 2018-02-07 DIAGNOSIS — E1122 Type 2 diabetes mellitus with diabetic chronic kidney disease: Secondary | ICD-10-CM

## 2018-02-07 DIAGNOSIS — I5043 Acute on chronic combined systolic (congestive) and diastolic (congestive) heart failure: Secondary | ICD-10-CM

## 2018-02-07 HISTORY — PX: RIGHT HEART CATH: CATH118263

## 2018-02-07 LAB — POCT I-STAT 3, VENOUS BLOOD GAS (G3P V)
Acid-Base Excess: 21 mmol/L — ABNORMAL HIGH (ref 0.0–2.0)
Acid-Base Excess: 23 mmol/L — ABNORMAL HIGH (ref 0.0–2.0)
BICARBONATE: 52.2 mmol/L — AB (ref 20.0–28.0)
Bicarbonate: 50.4 mmol/L — ABNORMAL HIGH (ref 20.0–28.0)
O2 Saturation: 57 %
O2 Saturation: 62 %
PCO2 VEN: 80.8 mmHg — AB (ref 44.0–60.0)
PH VEN: 7.402 (ref 7.250–7.430)
TCO2: >50 mmol/L — ABNORMAL HIGH (ref 22–32)
TCO2: >50 mmol/L — ABNORMAL HIGH (ref 22–32)
pCO2, Ven: 83.8 mmHg (ref 44.0–60.0)
pH, Ven: 7.403 (ref 7.250–7.430)
pO2, Ven: 32 mmHg (ref 32.0–45.0)
pO2, Ven: 35 mmHg (ref 32.0–45.0)

## 2018-02-07 LAB — CBC
HCT: 36.9 % — ABNORMAL LOW (ref 39.0–52.0)
Hemoglobin: 11 g/dL — ABNORMAL LOW (ref 13.0–17.0)
MCH: 32.6 pg (ref 26.0–34.0)
MCHC: 29.8 g/dL — ABNORMAL LOW (ref 30.0–36.0)
MCV: 109.5 fL — ABNORMAL HIGH (ref 80.0–100.0)
NRBC: 0 % (ref 0.0–0.2)
Platelets: 153 10*3/uL (ref 150–400)
RBC: 3.37 MIL/uL — ABNORMAL LOW (ref 4.22–5.81)
RDW: 17.1 % — ABNORMAL HIGH (ref 11.5–15.5)
WBC: 9.9 10*3/uL (ref 4.0–10.5)

## 2018-02-07 LAB — BASIC METABOLIC PANEL
Anion gap: 12 (ref 5–15)
BUN: 72 mg/dL — ABNORMAL HIGH (ref 8–23)
CO2: 42 mmol/L — ABNORMAL HIGH (ref 22–32)
Calcium: 9.8 mg/dL (ref 8.9–10.3)
Chloride: 87 mmol/L — ABNORMAL LOW (ref 98–111)
Creatinine, Ser: 2.9 mg/dL — ABNORMAL HIGH (ref 0.61–1.24)
GFR calc Af Amer: 23 mL/min — ABNORMAL LOW (ref >60)
GFR calc non Af Amer: 20 mL/min — ABNORMAL LOW (ref >60)
Glucose, Bld: 103 mg/dL — ABNORMAL HIGH (ref 70–99)
Potassium: 4 mmol/L (ref 3.5–5.1)
SODIUM: 141 mmol/L (ref 135–145)

## 2018-02-07 LAB — VITAMIN B12: VITAMIN B 12: 386 pg/mL (ref 180–914)

## 2018-02-07 LAB — BLOOD GAS, ARTERIAL
Acid-Base Excess: 18.2 mmol/L — ABNORMAL HIGH (ref 0.0–2.0)
Bicarbonate: 43.8 mmol/L — ABNORMAL HIGH (ref 20.0–28.0)
Drawn by: 414221
O2 Content: 2 L/min
O2 Saturation: 94 %
PATIENT TEMPERATURE: 98.2
pCO2 arterial: 66.6 mmHg (ref 32.0–48.0)
pH, Arterial: 7.433 (ref 7.350–7.450)
pO2, Arterial: 70.6 mmHg — ABNORMAL LOW (ref 83.0–108.0)

## 2018-02-07 LAB — GLUCOSE, CAPILLARY: Glucose-Capillary: 88 mg/dL (ref 70–99)

## 2018-02-07 LAB — FOLATE: FOLATE: 32.1 ng/mL (ref >5.9)

## 2018-02-07 LAB — HEPARIN LEVEL (UNFRACTIONATED): Heparin Unfractionated: 0.53 IU/mL (ref 0.30–0.70)

## 2018-02-07 LAB — PROTIME-INR
INR: 1.92
Prothrombin Time: 21.7 seconds — ABNORMAL HIGH (ref 11.4–15.2)

## 2018-02-07 SURGERY — RIGHT HEART CATH
Anesthesia: LOCAL

## 2018-02-07 MED ORDER — LIDOCAINE HCL (PF) 1 % IJ SOLN
INTRAMUSCULAR | Status: AC
  Filled 2018-02-07: qty 30

## 2018-02-07 MED ORDER — SODIUM CHLORIDE 0.9% FLUSH
3.0000 mL | Freq: Two times a day (BID) | INTRAVENOUS | Status: DC
  Administered 2018-02-07 – 2018-02-08 (×2): 3 mL via INTRAVENOUS

## 2018-02-07 MED ORDER — SODIUM CHLORIDE 0.9% FLUSH: 3.0000 mL | Freq: Two times a day (BID) | INTRAVENOUS | Status: DC

## 2018-02-07 MED ORDER — SODIUM CHLORIDE 0.9 % IV SOLN: 250.0000 mL | INTRAVENOUS | Status: DC | PRN

## 2018-02-07 MED ORDER — SODIUM CHLORIDE 0.9 % IV SOLN
INTRAVENOUS | Status: DC
  Administered 2018-02-07: 09:00:00 via INTRAVENOUS

## 2018-02-07 MED ORDER — WARFARIN SODIUM 5 MG PO TABS
5.0000 mg | ORAL_TABLET | Freq: Once | ORAL | Status: AC
  Administered 2018-02-07: 5 mg via ORAL
  Filled 2018-02-07: qty 1

## 2018-02-07 MED ORDER — ASPIRIN 81 MG PO CHEW: 81.0000 mg | CHEWABLE_TABLET | ORAL | Status: DC

## 2018-02-07 MED ORDER — HEPARIN (PORCINE) IN NACL 1000-0.9 UT/500ML-% IV SOLN
INTRAVENOUS | Status: DC | PRN
  Administered 2018-02-07: 500 mL

## 2018-02-07 MED ORDER — SODIUM CHLORIDE 0.9% FLUSH: 3.0000 mL | INTRAVENOUS | Status: DC | PRN

## 2018-02-07 MED ORDER — ONDANSETRON HCL 4 MG/2ML IJ SOLN: 4.0000 mg | Freq: Four times a day (QID) | INTRAMUSCULAR | Status: DC | PRN

## 2018-02-07 MED ORDER — LORAZEPAM 2 MG/ML IJ SOLN
1.0000 mg | Freq: Once | INTRAMUSCULAR | Status: AC
  Administered 2018-02-07: 1 mg via INTRAVENOUS
  Filled 2018-02-07: qty 1

## 2018-02-07 MED ORDER — SODIUM CHLORIDE 0.9 % IV SOLN: INTRAVENOUS | Status: DC

## 2018-02-07 MED ORDER — ENOXAPARIN SODIUM 30 MG/0.3ML ~~LOC~~ SOLN: 30.0000 mg | SUBCUTANEOUS | Status: DC

## 2018-02-07 MED ORDER — ACETAMINOPHEN 325 MG PO TABS: 650.0000 mg | ORAL_TABLET | ORAL | Status: DC | PRN

## 2018-02-07 MED ORDER — WARFARIN - PHARMACIST DOSING INPATIENT
Freq: Every day | Status: DC
  Administered 2018-02-09 – 2018-02-11 (×3)

## 2018-02-07 MED ORDER — LIDOCAINE HCL (PF) 1 % IJ SOLN
INTRAMUSCULAR | Status: DC | PRN
  Administered 2018-02-07: 2 mL

## 2018-02-07 MED ORDER — HEPARIN (PORCINE) IN NACL 1000-0.9 UT/500ML-% IV SOLN
INTRAVENOUS | Status: AC
  Filled 2018-02-07: qty 500

## 2018-02-07 MED ORDER — ASPIRIN 81 MG PO CHEW
81.0000 mg | CHEWABLE_TABLET | ORAL | Status: AC
  Administered 2018-02-07: 81 mg via ORAL
  Filled 2018-02-07: qty 1

## 2018-02-07 SURGICAL SUPPLY — 7 items
CATH BALLN WEDGE 5F 110CM (CATHETERS) ×1 IMPLANT
HOVERMATT SINGLE USE (MISCELLANEOUS) ×1 IMPLANT
PACK CARDIAC CATHETERIZATION (CUSTOM PROCEDURE TRAY) ×1 IMPLANT
SHEATH GLIDE SLENDER 4/5FR (SHEATH) ×1 IMPLANT
TRANSDUCER W/STOPCOCK (MISCELLANEOUS) ×1 IMPLANT
TUBING ART PRESS 72  MALE/FEM (TUBING) ×1
TUBING ART PRESS 72 MALE/FEM (TUBING) IMPLANT

## 2018-02-07 NOTE — Progress Notes (Addendum)
Progress Note  Patient Name: Omar Lambert Date of Encounter: 02/07/2018  Primary Cardiologist: [name], MD   Subjective   Per RN notes, patient was found on the floor last night. Patient denied hitting his head and denied any pain. There were no signs of bruising or bleeding. Patient states he was trying to fix the chair when the chair moved back and he slipped down. Patient also reportedly became disoriented and restless last night and ultimately required soft wrist restrains.  Patient still in soft wrist restraints this morning; however, he is not restless or agitated at this time. Patient's wife is by bedside and states he still seems somewhat confused. He denies any chest pain and states his breathing is fine.   Inpatient Medications    Scheduled Meds: . allopurinol  300 mg Oral Daily  . bisoprolol  5 mg Oral Daily  . calcitRIOL  0.25 mcg Oral QODAY  . calcitRIOL  0.5 mcg Oral QODAY  . Influenza vac split quadrivalent PF  0.5 mL Intramuscular Tomorrow-1000  . isosorbide mononitrate  30 mg Oral Daily  . levothyroxine  175 mcg Oral QAC breakfast  . mouth rinse  15 mL Mouth Rinse BID  . multivitamin with minerals  1 tablet Oral Daily  . pantoprazole  40 mg Oral Daily  . polyethylene glycol  17 g Oral Daily  . potassium chloride SA  20 mEq Oral Daily  . rosuvastatin  20 mg Oral Daily  . senna-docusate  1 tablet Oral BID  . sodium chloride flush  3 mL Intravenous Q12H  . sodium chloride flush  3 mL Intravenous Q12H  . umeclidinium-vilanterol  1 puff Inhalation Daily   Continuous Infusions: . sodium chloride Stopped (02/04/18 0004)  . sodium chloride    . sodium chloride    . furosemide 120 mg (02/07/18 2841)  . heparin 1,150 Units/hr (02/06/18 1848)   PRN Meds: sodium chloride, sodium chloride, acetaminophen, albuterol, fluticasone, ondansetron (ZOFRAN) IV, sodium chloride flush, sodium chloride flush   Vital Signs    Vitals:   02/07/18 0400 02/07/18 0547  02/07/18 0711 02/07/18 0742  BP: 111/73   120/76  Pulse: 73  74 72  Resp:   (!) 24 (!) 33  Temp: 97.6 F (36.4 C)   97.9 F (36.6 C)  TempSrc: Oral   Oral  SpO2: 99% (!) 2% 94% 91%  Weight:  103.2 kg    Height:        Intake/Output Summary (Last 24 hours) at 02/07/2018 0847 Last data filed at 02/07/2018 0835 Gross per 24 hour  Intake 371.33 ml  Output 2675 ml  Net -2303.67 ml   Filed Weights   02/05/18 0404 02/06/18 0500 02/07/18 0547  Weight: 103.4 kg 100.7 kg 103.2 kg    Telemetry    Atrial fibrillation with ventricular rates in the 60's to 80's. - Personally Reviewed  ECG    No new ECG tracing today. - Personally Reviewed  Physical Exam   General:77 y.o.obese African-Americanmaleresting comfortably in no acute distress. HEENT:Normal Neck: Supple.  Positive hepatojugular reflux.  Lungs:No significant increased work of breathing. Decreased breath sounds throughout, especially in bases. Scattered wheezes noted.  Heart:Irregularly irregular rhythm with regular rate.Distinct S1 and S2. Nosignificantmurmurs, gallops, or rubs.  Abdomen: Soft,mildly distended/obese, and non-tender to palpation. Bowel sounds present. Extremities:Mild lower extremity edema, improving. Radial pulses 2+ and equal bilaterally. Skin: Warm and dry. Neuro: No focal deficits. Psych: Normal affect.   Labs    Chemistry Recent Labs  Lab 01/31/18 1226  02/05/18 0243 02/06/18 0254 02/07/18 0351  NA 145   < > 142 142 141  K 4.1   < > 4.1 4.6 4.0  CL 99   < > 92* 93* 87*  CO2 36*   < > 40* 39* 42*  GLUCOSE 126*   < > 91 99 103*  BUN 38*   < > 65* 75* 72*  CREATININE 2.43*   < > 2.46* 2.78* 2.90*  CALCIUM 9.8   < > 9.6 9.6 9.8  PROT 8.1  --   --   --   --   ALBUMIN 3.7  --   --   --   --   AST 34  --   --   --   --   ALT 18  --   --   --   --   ALKPHOS 142*  --   --   --   --   BILITOT 1.2  --   --   --   --   GFRNONAA 25*   < > 24* 21* 20*  GFRAA 28*   < > 28* 24*  23*  ANIONGAP 10   < > 10 10 12    < > = values in this interval not displayed.     Hematology Recent Labs  Lab 02/03/18 0222 02/04/18 0329 02/07/18 0351  WBC 5.2 4.8 9.9  RBC 3.25* 3.39* 3.37*  HGB 10.5* 10.9* 11.0*  HCT 35.2* 37.1* 36.9*  MCV 108.3* 109.4* 109.5*  MCH 32.3 32.2 32.6  MCHC 29.8* 29.4* 29.8*  RDW 17.2* 17.1* 17.1*  PLT 158 158 153    Cardiac Enzymes Recent Labs  Lab 01/31/18 2206 02/01/18 0242 02/01/18 0904  TROPONINI 0.08* 0.07* 0.06*    Recent Labs  Lab 01/31/18 1234  TROPIPOC 0.08     BNP Recent Labs  Lab 01/31/18 1227  BNP 494.5*     DDimer No results for input(s): DDIMER in the last 168 hours.   Radiology    No results found.  Cardiac Studies   Echocardiogram 02/02/2018: Study Conclusions: - Left ventricle: The cavity size was normal. Wall thickness was increased in a pattern of severe LVH. Systolic function was normal. The estimated ejection fraction was in the range of 50% to 55%. Wall motion was normal; there were no regional wall motion abnormalities. The study was not technically sufficient to allow evaluation of LV diastolic dysfunction due to atrial fibrillation. - Ventricular septum: There is a D shaped septum during diastolic c/s RV volume overload. - Left atrium: The atrium was mildly dilated. - Right ventricle: The cavity size was mildly dilated. Wall thickness was normal. - Right atrium: The atrium was severely dilated. Central venous pressure (est): 15 mm Hg. - Tricuspid valve: There was severe regurgitation. - Pulmonic valve: There was mild regurgitation. - Pulmonary arteries: PA peak pressure: 62 mm Hg (S).  Impressions: - The right ventricular systolic pressure was increased consistent with moderate pulmonary hypertension.  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 Karleen Hampshire.  Assessment & Plan    Acute Diastolic CHF - BNP elevated at 494.5. - Echo showed LVEF of 50-55% with severe LVH but no regional wall motion abnormality and elevated PASP of 62  mmHg with mildly dilated RV and severely dilated RA (central venous pressure 15 mmHg) consistent with moderate pulmonary hypertension. -Diuresing well.Documented output of 2.675 in the past 24 hours with net negative 10.3 L since admission. Renal function elevated but at baseline. - Weight227.52 lbs today,down about 9 lbs since admission.Weights at last outpatient visit and at last discharge were 233 lbs. - Will hold Lasix prior to right heart catheterization.  - Per Dr. 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 Infiltrative process such as amyloidosis is a consideration (severe LVH with low voltage on EKG)." Consider cardiac MRI pending right heart catheterization results.  - Patient initially planned for right heart catheterization this morning. However, due to patient's restlessness and confusion overnight, have moved catheterization to later this afternoon. Per Dr. Claiborne Billings, will recheck INR around noon.   Atrial Fibrillation - Telemetry showsatrial fibrillation with ventricular rates well controlled in the 60's to 80's.  - Patient is not on any AV nodal agents at home. Previously on Amiodarone but this was stopped due to significant bradycardia while in atrial fibrillation.  - Continue Bisoprolol 5mg  daily.  - Coumadin on hold prior to catheterization. INR 1.92 this morning.   Elevated Troponin / Demand Ischemia - EKG on admission showed atrial fibrillation with non-specific ST/T changes. - Troponin minimally elevated and flat at 0.08 >> 0.07 >> 0.06. Not consistent with ACS. Likely demand ischemia in the  setting of acute CHF. - Patient denies any chest pain this morning.   Pulmonary Hypertension - Echo consistent with moderate pulmonary hypertension with PASP of 62 mmHg (47 mmHg in 08/2017) and increased right ventricular systolic pressure. - Already followed by outpatient Pulmonology for COPD.  Hypertension - BP currently well controlled at 120/76. - Continue current medications.  COPD - Per primary team.  CKD Stage IV - Serum creatinine 2.43 on admission. Baseline appears to be 2.0 to 2.6.  - Serum creatinine 2.90  today. - Will hold Lasix as noted above.  - Continue to monitor renal function and electrolytes. - Followed by outpatient Nephrology.  For questions or updates, please contact [place] Please consult [email or web address] for contact info under        Signed, Darreld Mclean, PA-C  02/07/2018, 8:47 AM     Patient seen and examined. Agree with assessment and plan.  Clinically more alert compared to very early morning.  INR this morning 1.92.  Will recheck at noon.  Not start heparin since plan for catheterization.  Ultimately will need to reinstitute warfarin anticoagulation in light of his permanent atrial fibrillation.  Plan for right heart catheterization later today reassess volume status.  I discussed the procedure again with patient and wife.  The patient is status post significant diuresis with -10, 341 admission.  Creatinine is increased to 2.9 from baseline around 2-2.4.  Prior echo Doppler study had shown dilated RV with PA pressure at 62.  Will reassess right heart pressures today and hold or adjust diuretics as clinically indicated.  Severe LVH noted on echo.  Consider subsequent evaluation for possible infiltrative cardiomyopathy. Troy Sine, MD, [facility] 02/07/2018 9:47 AM

## 2018-02-07 NOTE — Progress Notes (Signed)
PT was ID using name, DOB and MRN

## 2018-02-07 NOTE — Progress Notes (Signed)
Placed pt on CPAP. Pt tolerating well. RT To cont to monitor.

## 2018-02-07 NOTE — Progress Notes (Signed)
PT in cath lab, unable to do ABG at this time

## 2018-02-07 NOTE — Progress Notes (Addendum)
Patient noted on side of bed with bed alarming. Patient proceeded to pull condom cath off and urinated in floor as well as bed.  Also attempting to get out of bed. Patient cleaned and linens changed. Assisted to chair x1 and back in bed. Bed alarm intact and working properly.   0200-Pt attempted to get out of bed without assistance. Reoriented to place and time. Bed alarm on. Siderails up x3.  0230-Pt continues to attempt to get out of bed, continued to reorient and repositioned for comfort in bed. Denies pain.  0300- Pt mumbling to self in bed. Staff in room for safety. Patient disoriented to place and time.   0445- Pt sitting on side of bed. Repositioned. Condom cath intact. Reoriented and continue to explain to patient that it is unsafe for him to get out of bed. Bed alarm intact and working.   0515- Patient continues to attempt to crawl out of bed. When asked where is he going, he asks for "dorothy" reoriented to him being In the hospital. Patient laughs, appears very restless.   0525- [facility] called about patient continues to get up without assistance during the night.   0530- Soft wrist restraints ordered and applied correctly. Explained criteria to patient to get restraints released.   0545-[name], patient's wife notified of soft wrist restraints being placed on patient this am.   0630- Patient's spouse here at bedside. Cpap intact, restraints intact. No skin injury. No distress noted. 02 sats 96% on cpap.

## 2018-02-07 NOTE — Progress Notes (Signed)
PROGRESS NOTE    Omar Lambert Lambert  JKK:[id number] DOB: 06-08-1940 DOA: 01/31/2018 PCP: Gayland Curry, DO    Brief Narrative: Omar Lambert Lambert is a 78 y.o. male with medical history significant of DM2, Hypothyroidism, HTN, Gout, GERD, COPD, CKD, diastolic CHF, asthma who presents for SOB and swelling. Pt admitted for acute on chronic diastolic heart failure.    Assessment & Plan:   Active Problems:   Benign essential hypertension   COPD exacerbation (HCC)   Esophageal reflux   Chronic kidney disease (CKD) stage G3b/A2, moderately decreased glomerular filtration rate (GFR) between 30-44 mL/min/1.73 square meter and albuminuria creatinine ratio between 30-299 mg/g (HCC)   Persistent atrial fibrillation   Hypothyroidism   Diabetes mellitus with renal complications (HCC)   Shortness of breath   Acute on chronic congestive heart failure (HCC)   Acute on chronic diastolic (congestive) heart failure (HCC)   Acute on chronic diastolic heart failure Admitted to stepdown .  Probably secondary to noncompliance to fluid and salt restriction. Patient continues to be fluid overloaded requiring IV Lasix 120 mg every 8 hours, held this morning for worsening renal parameters. Continue with strict intake and output and daily weights and monitor renal parameters while on IV Lasix.    Repeat echocardiogram showed Wall thickness wasincreased in a pattern of severe LVH. Systolic function was normal. The estimated ejection fraction was in the range of 50%  to 55%. Wall motion was normal; there were no regional wall  motion abnormalities. The study was not technically sufficient to allow evaluation of LV diastolic dysfunction due to atrial fibrillation. Cardiology consulted. He will probably need RHC for further evaluation.  Plan for [place] later this afternoon depending on the INR. Use BiPAP as needed and nasal cannula oxygen to keep sats greater than 90%. He has diuresed about 10 lit of since admission .      Elevated troponins probably from demand ischemia from acute CHF.    Essential hypertension Well-controlled Continue home medication    Mild acute on Stage III chronic kidney disease Watch renal parameters while on IV diuretics. Creatinine at 2.9 .  Nephrology consulted for recommendations.   Persistent atrial fibrillation Rate controlled IV heparin for cardiac cath.    Type 2 diabetes mellitus with renal complications  CBG (last 3)  Recent Labs    02/05/18 1633 02/05/18 2127 02/07/18 0747  GLUCAP 114* 97 88   WELL controlled, continue with sliding scale insulin. No changes in medications.     Hyperlipidemia Continue with Crestor  Patient slipped from the chair and fell yesterday night.  Patient denies any new complaints but he appears confused and lethargic today we will get CT head without contrast and ABG for further evaluation.    DVT prophylaxis: Heparin Code Status: full code.  Family Communication: Wife at bedside Disposition Plan:pending clinical improvement and cardiology recommendations.   Consultants:  Cardiology. Nephrology Dr. Johnney Ou Palliative care   Procedures: Echocardiogram.      Antimicrobials: none.    Subjective: Currently on CPAP at this time.  Objective: Vitals:   02/07/18 0547 02/07/18 0711 02/07/18 0742 02/07/18 1124  BP:   120/76 117/63  Pulse:  74 72 72  Resp:  (!) 24 (!) 33 (!) 26  Temp:   97.9 F (36.6 C) 98 F (36.7 C)  TempSrc:   Oral Oral  SpO2: (!) 2% 94% 91% 97%  Weight: 103.2 kg     Height:        Intake/Output Summary (Last  24 hours) at 02/07/2018 1206 Last data filed at 02/07/2018 1124 Gross per 24 hour  Intake 340.78 ml  Output 3275 ml  Net -2934.22 ml   Filed Weights   02/05/18 0404 02/06/18 0500 02/07/18 0547  Weight: 103.4 kg 100.7 kg 103.2 kg    Examination:  General exam: Lethargic, not in any kind of distress Respiratory system: Air entry fair, no wheezing or  rhonchi Cardiovascular system: S1 & S2 heard, irregular, JVD present.  Gastrointestinal system: Abdomen is soft, nontender, nondistended, bowel sounds are good Central nervous system: Lethargic, slightly confused. Extremities:able to move all extremities.  2+ pedal edema present.  Skin: No rashes, lesions or ulcers Psychiatry:  Mood & affect appropriate.     Data Reviewed: I have personally reviewed following labs and imaging studies  CBC: Recent Labs  Lab 01/31/18 1226 02/02/18 0335 02/03/18 0222 02/04/18 0329 02/07/18 0351  WBC 4.5 7.3 5.2 4.8 9.9  NEUTROABS 3.4  --   --   --   --   HGB 10.3* 10.2* 10.5* 10.9* 11.0*  HCT 36.5* 35.2* 35.2* 37.1* 36.9*  MCV 112.0* 107.0* 108.3* 109.4* 109.5*  PLT 156 149* 158 158 175   Basic Metabolic Panel: Recent Labs  Lab 02/03/18 0222 02/04/18 0329 02/05/18 0243 02/06/18 0254 02/07/18 0351  NA 145 141 142 142 141  K 4.0 4.2 4.1 4.6 4.0  CL 96* 90* 92* 93* 87*  CO2 38* 38* 40* 39* 42*  GLUCOSE 104* 105* 91 99 103*  BUN 61* 60* 65* 75* 72*  CREATININE 2.45* 2.53* 2.46* 2.78* 2.90*  CALCIUM 9.5 9.6 9.6 9.6 9.8  MG  --   --  2.6*  --   --    GFR: Estimated Creatinine Clearance: 24.9 mL/min (A) (by C-G formula based on SCr of 2.9 mg/dL (H)). Liver Function Tests: Recent Labs  Lab 01/31/18 1226  AST 34  ALT 18  ALKPHOS 142*  BILITOT 1.2  PROT 8.1  ALBUMIN 3.7   No results for input(s): LIPASE, AMYLASE in the last 168 hours. No results for input(s): AMMONIA in the last 168 hours. Coagulation Profile: Recent Labs  Lab 02/03/18 0222 02/04/18 0329 02/05/18 0243 02/06/18 0254 02/07/18 0351  INR 2.13 2.02 2.16 1.98 1.92   Cardiac Enzymes: Recent Labs  Lab 01/31/18 2206 02/01/18 0242 02/01/18 0904  TROPONINI 0.08* 0.07* 0.06*   BNP (last 3 results) No results for input(s): PROBNP in the last 8760 hours. HbA1C: No results for input(s): HGBA1C in the last 72 hours. CBG: Recent Labs  Lab 02/05/18 0838  02/05/18 1137 02/05/18 1633 02/05/18 2127 02/07/18 0747  GLUCAP 84 92 114* 97 88   Lipid Profile: No results for input(s): CHOL, HDL, LDLCALC, TRIG, CHOLHDL, LDLDIRECT in the last 72 hours. Thyroid Function Tests: No results for input(s): TSH, T4TOTAL, FREET4, T3FREE, THYROIDAB in the last 72 hours. Anemia Panel: Recent Labs    02/07/18 0351  VITAMINB12 386  FOLATE 32.1   Sepsis Labs: Recent Labs  Lab 01/31/18 1235 01/31/18 1452  LATICACIDVEN 3.40* 2.01*    Recent Results (from the past 240 hour(s))  Blood culture (routine x 2)     Status: None   Collection Time: 01/31/18 12:28 PM  Result Value Ref Range Status   Specimen Description BLOOD RIGHT ANTECUBITAL  Final   Special Requests   Final    BOTTLES DRAWN AEROBIC AND ANAEROBIC Blood Culture adequate volume   Culture   Final    NO GROWTH 5 DAYS Performed at [facility], [place] [address], [place], [place] [address]    Report Status 02/05/2018 FINAL  Final  Blood culture (routine x 2)     Status: None   Collection Time: 01/31/18  1:41 PM  Result Value Ref Range Status   Specimen Description BLOOD LEFT ANTECUBITAL  Final   Special Requests   Final    BOTTLES DRAWN AEROBIC AND ANAEROBIC Blood Culture adequate volume   Culture   Final    NO GROWTH 5 DAYS Performed at [facility], [place] [address], [place], [place] [address]    Report Status 02/05/2018 FINAL  Final  MRSA PCR Screening     Status: None   Collection Time: 01/31/18  9:38 PM  Result Value Ref Range Status   MRSA by PCR NEGATIVE NEGATIVE Final    Comment:        The GeneXpert MRSA Assay (FDA approved for NASAL specimens only), is one component of a comprehensive MRSA colonization surveillance program. It is not intended to diagnose MRSA infection nor to guide or monitor treatment for MRSA infections. Performed at [facility], [place] [address], [place], [place] [address]          Radiology Studies: No results  found.      Scheduled Meds: . allopurinol  300 mg Oral Daily  . bisoprolol  5 mg Oral Daily  . calcitRIOL  0.25 mcg Oral QODAY  . calcitRIOL  0.5 mcg Oral QODAY  . Influenza vac split quadrivalent PF  0.5 mL Intramuscular Tomorrow-1000  . isosorbide mononitrate  30 mg Oral Daily  . levothyroxine  175 mcg Oral QAC breakfast  . mouth rinse  15 mL Mouth Rinse BID  . multivitamin with minerals  1 tablet Oral Daily  . pantoprazole  40 mg Oral Daily  . polyethylene glycol  17 g Oral Daily  . potassium chloride SA  20 mEq Oral Daily  . rosuvastatin  20 mg Oral Daily  . senna-docusate  1 tablet Oral BID  . sodium chloride flush  3 mL Intravenous Q12H  . sodium chloride flush  3 mL Intravenous Q12H  . umeclidinium-vilanterol  1 puff Inhalation Daily   Continuous Infusions: . sodium chloride Stopped (02/04/18 0004)  . sodium chloride    . sodium chloride 10 mL/hr at 02/07/18 0905  . furosemide 120 mg (02/06/18 2227)     LOS: 7 days    Time spent:32  minutes.     Hosie Poisson, MD [facility] Pager [phone]  If 7PM-7AM, please contact night-coverage [email or web address] Password [facility] 02/07/2018, 12:06 PM

## 2018-02-07 NOTE — Progress Notes (Signed)
ANTICOAGULATION CONSULT NOTE - Follow up [facility] for coumadin Indication: atrial fibrillation  No Known Allergies  Patient Measurements: Height: 5\' 9"  (175.3 cm) Weight: 227 lb 8.2 oz (103.2 kg) IBW/kg (Calculated) : 70.7  Vital Signs: Temp: 98.3 F (36.8 C) (01/03 1335) Temp Source: Oral (01/03 1335) BP: 94/75 (01/03 1335) Pulse Rate: 72 (01/03 1124)  Labs: Recent Labs    02/05/18 0243 02/06/18 0254 02/06/18 1620 02/07/18 0351  HGB  --   --   --  11.0*  HCT  --   --   --  36.9*  PLT  --   --   --  153  LABPROT 23.8* 22.2*  --  21.7*  INR 2.16 1.98  --  1.92  HEPARINUNFRC  --   --  0.28* 0.53  CREATININE 2.46* 2.78*  --  2.90*    Estimated Creatinine Clearance: 24.9 mL/min (A) (by C-G formula based on SCr of 2.9 mg/dL (H)).   Medical History: Past Medical History:  Diagnosis Date  . Acute kidney injury superimposed on chronic kidney disease ([place]) 11/2016  . Acute respiratory failure ([place]) 11/2016  . Allergy   . Asthma   . Atrial fibrillation ([place])   . 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 Benign essential hypertension   . CHF (congestive heart failure) ([place])   . CKD (chronic kidney disease), stage III ([place])   . COPD (chronic obstructive pulmonary disease) ([place])   . Diabetes ([place])   . Diabetes mellitus without complication ([place])   . Dyspnea   . Gastritis   . GERD (gastroesophageal reflux disease)   . Gout attack 09/2012  . Hypertension   . Sinusitis, maxillary, chronic   . Thyroid disease   . Type II or unspecified type diabetes mellitus without mention of complication, not stated as uncontrolled     Assessment: 37 yoM admitted with SOB and swelling. Pt on warfarin at home for AFib, and pharmacy restart -INR= 1.92  Home dose: 2.5mg  daily except for 5mg  on Mon/Fri  Goal of Therapy:  INR 2-3 Monitor platelets by anticoagulation protocol: Yes   Plan:  -Coumadin 5mg  po today -Daily PT/INR  Hildred Laser,  PharmD Clinical Pharmacist **Pharmacist phone directory can now be found on [email or web address] ([email or web address]).  Listed under [place].

## 2018-02-07 NOTE — Progress Notes (Signed)
ANTICOAGULATION CONSULT NOTE - Follow up [place] for heparin Indication: atrial fibrillation  No Known Allergies  Patient Measurements: Height: 5\' 9"  (175.3 cm) Weight: 227 lb 8.2 oz (103.2 kg) IBW/kg (Calculated) : 70.7  Vital Signs: Temp: 97.9 F (36.6 C) (01/03 0742) Temp Source: Oral (01/03 0742) BP: 120/76 (01/03 0742) Pulse Rate: 72 (01/03 0742)  Labs: Recent Labs    02/05/18 0243 02/06/18 0254 02/06/18 1620 02/07/18 0351  HGB  --   --   --  11.0*  HCT  --   --   --  36.9*  PLT  --   --   --  153  LABPROT 23.8* 22.2*  --  21.7*  INR 2.16 1.98  --  1.92  HEPARINUNFRC  --   --  0.28* 0.53  CREATININE 2.46* 2.78*  --  2.90*    Estimated Creatinine Clearance: 24.9 mL/min (A) (by C-G formula based on SCr of 2.9 mg/dL (H)).   Medical History: Past Medical History:  Diagnosis Date  . Acute kidney injury superimposed on chronic kidney disease ([place]) 11/2016  . Acute respiratory failure ([place]) 11/2016  . Allergy   . Asthma   . Atrial fibrillation ([place])   . 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 Benign essential hypertension   . CHF (congestive heart failure) ([place])   . CKD (chronic kidney disease), stage III ([place])   . COPD (chronic obstructive pulmonary disease) ([place])   . Diabetes ([place])   . Diabetes mellitus without complication ([place])   . Dyspnea   . Gastritis   . GERD (gastroesophageal reflux disease)   . Gout attack 09/2012  . Hypertension   . Sinusitis, maxillary, chronic   . Thyroid disease   . Type II or unspecified type diabetes mellitus without mention of complication, not stated as uncontrolled     Assessment: 70 yoM admitted with SOB and swelling. Pt on warfarin at home for AFib, and pharmacy to start heparin when INR < 2.0. Plans for RHC -INR= 1.92, heparin level= 0.53  Home dose: 2.5mg  daily except for 5mg  on Mon/Fri  Goal of Therapy:  INR 2-3 Monitor platelets by anticoagulation protocol: Yes   Plan:   -No heparin changes needed -Heparin level daily wth CBC   Hildred Laser, PharmD Clinical Pharmacist **Pharmacist phone directory can now be found on [email or web address] ([email or web address]).  Listed under [place].

## 2018-02-07 NOTE — Interval H&P Note (Signed)
History and Physical Interval Note:  02/07/2018 12:40 PM  Omar Lambert  has presented today for surgery, with the diagnosis of CHF  The various methods of treatment have been discussed with the patient and family. After consideration of risks, benefits and other options for treatment, the patient has consented to  Procedure(s): RIGHT HEART CATH (N/A) as a surgical intervention .  The patient's history has been reviewed, patient examined, no change in status, stable for surgery.  I have reviewed the patient's chart and labs.  Questions were answered to the patient's satisfaction.     [name]

## 2018-02-07 NOTE — H&P (View-Only) (Signed)
Progress Note  Patient Name: Omar Lambert Date of Encounter: 02/07/2018  Primary Cardiologist: [name], MD   Subjective   Per RN notes, patient was found on the floor last night. Patient denied hitting his head and denied any pain. There were no signs of bruising or bleeding. Patient states he was trying to fix the chair when the chair moved back and he slipped down. Patient also reportedly became disoriented and restless last night and ultimately required soft wrist restrains.  Patient still in soft wrist restraints this morning; however, he is not restless or agitated at this time. Patient's wife is by bedside and states he still seems somewhat confused. He denies any chest pain and states his breathing is fine.   Inpatient Medications    Scheduled Meds: . allopurinol  300 mg Oral Daily  . bisoprolol  5 mg Oral Daily  . calcitRIOL  0.25 mcg Oral QODAY  . calcitRIOL  0.5 mcg Oral QODAY  . Influenza vac split quadrivalent PF  0.5 mL Intramuscular Tomorrow-1000  . isosorbide mononitrate  30 mg Oral Daily  . levothyroxine  175 mcg Oral QAC breakfast  . mouth rinse  15 mL Mouth Rinse BID  . multivitamin with minerals  1 tablet Oral Daily  . pantoprazole  40 mg Oral Daily  . polyethylene glycol  17 g Oral Daily  . potassium chloride SA  20 mEq Oral Daily  . rosuvastatin  20 mg Oral Daily  . senna-docusate  1 tablet Oral BID  . sodium chloride flush  3 mL Intravenous Q12H  . sodium chloride flush  3 mL Intravenous Q12H  . umeclidinium-vilanterol  1 puff Inhalation Daily   Continuous Infusions: . sodium chloride Stopped (02/04/18 0004)  . sodium chloride    . sodium chloride    . furosemide 120 mg (02/07/18 4010)  . heparin 1,150 Units/hr (02/06/18 1848)   PRN Meds: sodium chloride, sodium chloride, acetaminophen, albuterol, fluticasone, ondansetron (ZOFRAN) IV, sodium chloride flush, sodium chloride flush   Vital Signs    Vitals:   02/07/18 0400 02/07/18 0547  02/07/18 0711 02/07/18 0742  BP: 111/73   120/76  Pulse: 73  74 72  Resp:   (!) 24 (!) 33  Temp: 97.6 F (36.4 C)   97.9 F (36.6 C)  TempSrc: Oral   Oral  SpO2: 99% (!) 2% 94% 91%  Weight:  103.2 kg    Height:        Intake/Output Summary (Last 24 hours) at 02/07/2018 0847 Last data filed at 02/07/2018 0835 Gross per 24 hour  Intake 371.33 ml  Output 2675 ml  Net -2303.67 ml   Filed Weights   02/05/18 0404 02/06/18 0500 02/07/18 0547  Weight: 103.4 kg 100.7 kg 103.2 kg    Telemetry    Atrial fibrillation with ventricular rates in the 60's to 80's. - Personally Reviewed  ECG    No new ECG tracing today. - Personally Reviewed  Physical Exam   General:77 y.o.obese African-Americanmaleresting comfortably in no acute distress. HEENT:Normal Neck: Supple.  Positive hepatojugular reflux.  Lungs:No significant increased work of breathing. Decreased breath sounds throughout, especially in bases. Scattered wheezes noted.  Heart:Irregularly irregular rhythm with regular rate.Distinct S1 and S2. Nosignificantmurmurs, gallops, or rubs.  Abdomen: Soft,mildly distended/obese, and non-tender to palpation. Bowel sounds present. Extremities:Mild lower extremity edema, improving. Radial pulses 2+ and equal bilaterally. Skin: Warm and dry. Neuro: No focal deficits. Psych: Normal affect.   Labs    Chemistry Recent Labs  Lab 01/31/18 1226  02/05/18 0243 02/06/18 0254 02/07/18 0351  NA 145   < > 142 142 141  K 4.1   < > 4.1 4.6 4.0  CL 99   < > 92* 93* 87*  CO2 36*   < > 40* 39* 42*  GLUCOSE 126*   < > 91 99 103*  BUN 38*   < > 65* 75* 72*  CREATININE 2.43*   < > 2.46* 2.78* 2.90*  CALCIUM 9.8   < > 9.6 9.6 9.8  PROT 8.1  --   --   --   --   ALBUMIN 3.7  --   --   --   --   AST 34  --   --   --   --   ALT 18  --   --   --   --   ALKPHOS 142*  --   --   --   --   BILITOT 1.2  --   --   --   --   GFRNONAA 25*   < > 24* 21* 20*  GFRAA 28*   < > 28* 24*  23*  ANIONGAP 10   < > 10 10 12    < > = values in this interval not displayed.     Hematology Recent Labs  Lab 02/03/18 0222 02/04/18 0329 02/07/18 0351  WBC 5.2 4.8 9.9  RBC 3.25* 3.39* 3.37*  HGB 10.5* 10.9* 11.0*  HCT 35.2* 37.1* 36.9*  MCV 108.3* 109.4* 109.5*  MCH 32.3 32.2 32.6  MCHC 29.8* 29.4* 29.8*  RDW 17.2* 17.1* 17.1*  PLT 158 158 153    Cardiac Enzymes Recent Labs  Lab 01/31/18 2206 02/01/18 0242 02/01/18 0904  TROPONINI 0.08* 0.07* 0.06*    Recent Labs  Lab 01/31/18 1234  TROPIPOC 0.08     BNP Recent Labs  Lab 01/31/18 1227  BNP 494.5*     DDimer No results for input(s): DDIMER in the last 168 hours.   Radiology    No results found.  Cardiac Studies   Echocardiogram 02/02/2018: Study Conclusions: - Left ventricle: The cavity size was normal. Wall thickness was increased in a pattern of severe LVH. Systolic function was normal. The estimated ejection fraction was in the range of 50% to 55%. Wall motion was normal; there were no regional wall motion abnormalities. The study was not technically sufficient to allow evaluation of LV diastolic dysfunction due to atrial fibrillation. - Ventricular septum: There is a D shaped septum during diastolic c/s RV volume overload. - Left atrium: The atrium was mildly dilated. - Right ventricle: The cavity size was mildly dilated. Wall thickness was normal. - Right atrium: The atrium was severely dilated. Central venous pressure (est): 15 mm Hg. - Tricuspid valve: There was severe regurgitation. - Pulmonic valve: There was mild regurgitation. - Pulmonary arteries: PA peak pressure: 62 mm Hg (S).  Impressions: - The right ventricular systolic pressure was increased consistent with moderate pulmonary hypertension.  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 Karleen Hampshire.  Assessment & Plan    Acute Diastolic CHF - BNP elevated at 494.5. - Echo showed LVEF of 50-55% with severe LVH but no regional wall motion abnormality and elevated PASP of 62  mmHg with mildly dilated RV and severely dilated RA (central venous pressure 15 mmHg) consistent with moderate pulmonary hypertension. -Diuresing well.Documented output of 2.675 in the past 24 hours with net negative 10.3 L since admission. Renal function elevated but at baseline. - Weight227.52 lbs today,down about 9 lbs since admission.Weights at last outpatient visit and at last discharge were 233 lbs. - Will hold Lasix prior to right heart catheterization.  - Per Dr. 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 Infiltrative process such as amyloidosis is a consideration (severe LVH with low voltage on EKG)." Consider cardiac MRI pending right heart catheterization results.  - Patient initially planned for right heart catheterization this morning. However, due to patient's restlessness and confusion overnight, have moved catheterization to later this afternoon. Per Dr. Claiborne Billings, will recheck INR around noon.   Atrial Fibrillation - Telemetry showsatrial fibrillation with ventricular rates well controlled in the 60's to 80's.  - Patient is not on any AV nodal agents at home. Previously on Amiodarone but this was stopped due to significant bradycardia while in atrial fibrillation.  - Continue Bisoprolol 5mg  daily.  - Coumadin on hold prior to catheterization. INR 1.92 this morning.   Elevated Troponin / Demand Ischemia - EKG on admission showed atrial fibrillation with non-specific ST/T changes. - Troponin minimally elevated and flat at 0.08 >> 0.07 >> 0.06. Not consistent with ACS. Likely demand ischemia in the  setting of acute CHF. - Patient denies any chest pain this morning.   Pulmonary Hypertension - Echo consistent with moderate pulmonary hypertension with PASP of 62 mmHg (47 mmHg in 08/2017) and increased right ventricular systolic pressure. - Already followed by outpatient Pulmonology for COPD.  Hypertension - BP currently well controlled at 120/76. - Continue current medications.  COPD - Per primary team.  CKD Stage IV - Serum creatinine 2.43 on admission. Baseline appears to be 2.0 to 2.6.  - Serum creatinine 2.90  today. - Will hold Lasix as noted above.  - Continue to monitor renal function and electrolytes. - Followed by outpatient Nephrology.  For questions or updates, please contact [place] Please consult [email or web address] for contact info under        Signed, Darreld Mclean, PA-C  02/07/2018, 8:47 AM     Patient seen and examined. Agree with assessment and plan.  Clinically more alert compared to very early morning.  INR this morning 1.92.  Will recheck at noon.  Not start heparin since plan for catheterization.  Ultimately will need to reinstitute warfarin anticoagulation in light of his permanent atrial fibrillation.  Plan for right heart catheterization later today reassess volume status.  I discussed the procedure again with patient and wife.  The patient is status post significant diuresis with -10, 341 admission.  Creatinine is increased to 2.9 from baseline around 2-2.4.  Prior echo Doppler study had shown dilated RV with PA pressure at 62.  Will reassess right heart pressures today and hold or adjust diuretics as clinically indicated.  Severe LVH noted on echo.  Consider subsequent evaluation for possible infiltrative cardiomyopathy. Omar Sine, MD, [facility] 02/07/2018 9:47 AM

## 2018-02-07 NOTE — Consult Note (Signed)
[facility]    NEPHROLOGY CONSULTATION NOTE  PATIENT ID:  Omar Lambert, DOB:  August 14, 1940  HPI: The patient is a 78 y.o. year old male with a history of Chronic diastolic CHF, a-fib/flutter, HTN, Type II DM, and CKD IV who presented for SOB and underwent a right heart cath today revealing elevated PASP/PADP, and elevated wedge pressure.  Review of labs reveal a quite variable creatinine, with creatinines in the 2.5-5 range in the past.  Most recently, his baseline appears to be around 2.5.  His wife is at the bedside and notes him to be somewhat confused and somnolent.  Renal consultation has been obtained for CHF and CKD IV.  Past Medical History:  Diagnosis Date  . Acute kidney injury superimposed on chronic kidney disease ([place]) 11/2016  . Acute respiratory failure ([place]) 11/2016  . Allergy   . Asthma   . Atrial fibrillation ([place])   . 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 Benign essential hypertension   . CHF (congestive heart failure) ([place])   . CKD (chronic kidney disease), stage III ([place])   . COPD (chronic obstructive pulmonary disease) ([place])   . Diabetes ([place])   . Diabetes mellitus without complication ([place])   . Dyspnea   . Gastritis   . GERD (gastroesophageal reflux disease)   . Gout attack 09/2012  . Hypertension   . Sinusitis, maxillary, chronic   . Thyroid disease   . Type II or unspecified type diabetes mellitus without mention of complication, not stated as uncontrolled     Past Surgical History:  Procedure Laterality Date  . COLONOSCOPY  2003  . EYE SURGERY Left 05/06/2017   Cataract removed  . EYE SURGERY Right 02/05/2013   Cataract removed  . HERNIA REPAIR  1980  . IR THORACENTESIS ASP PLEURAL SPACE W/IMG GUIDE  12/02/2017  . RIGHT HEART CATH N/A 02/07/2018   Procedure: RIGHT HEART CATH;  Surgeon: Jettie Booze, MD;  Location: [facility];  Service: Cardiovascular;  Laterality: N/A;    Family History  Problem  Relation Age of Onset  . Stroke Mother   . Diabetes Mother   . Cancer Sister   . Diabetes Sister   . COPD Sister   . COPD Sister   . Diabetes Father     Social History   Tobacco Use  . Smoking status: Former Smoker    Packs/day: 1.00    Years: 20.00    Pack years: 20.00    Types: Cigarettes    Last attempt to quit: 02/05/1997    Years since quitting: 21.0  . Smokeless tobacco: Never Used  Substance Use Topics  . Alcohol use: No  . Drug use: No    REVIEW OF SYSTEMS: General:  positive fatigue, no weakness Head:  no headaches Eyes:  no blurred vision ENT:  no sore throat Neck:  no masses CV:  no chest pain, no orthopnea Lungs:  (+) shortness of breath, no cough GI:  no nausea or vomiting, no diarrhea GU:  no dysuria or hematuria Skin:  no rashes or lesions Neuro:  no focal numbness or weakness Psych:  no depression or anxiety, intermittent confusion    PHYSICAL EXAM:  Vitals:   02/07/18 1335 02/07/18 1516  BP: 94/75 108/70  Pulse:  78  Resp: (!) 21 (!) 30  Temp: 98.3 F (36.8 C) 98.2 F (36.8 C)  SpO2: 95% 97%   I/O last 3 completed shifts: In: 1016.1 [P.O.:240; I.V.:201.3;  IV Piggyback:574.7] Out: 4081 [Urine:3325]   General: somnolent, NAD HEENT: MMM [place] AT anicteric sclera Neck:  No JVD, no adenopathy CV:  Heart RRR  Lungs:  L/S with bibasilar Abd:  abd SNT/ND with normal BS, obese GU:  Bladder non-palpable Extremities:  (+)2 LE edema. Skin:  No skin rash Psych:  Flat mood and affect Neuro:  no focal deficits  MEDICATIONS:   Current Facility-Administered Medications:  .  0.9 %  sodium chloride infusion, 250 mL, Intravenous, PRN, Jettie Booze, MD, Stopped at 02/04/18 0004 .  0.9 %  sodium chloride infusion, 250 mL, Intravenous, PRN, Jettie Booze, MD .  acetaminophen (TYLENOL) tablet 650 mg, 650 mg, Oral, Q6H PRN, [name] S, MD .  albuterol (PROVENTIL) (2.5 MG/3ML) 0.083% nebulizer solution 2.5 mg, 2.5 mg,  Nebulization, Q2H PRN, [name] S, MD .  allopurinol (ZYLOPRIM) tablet 300 mg, 300 mg, Oral, Daily, [name] S, MD, 300 mg at 02/07/18 1334 .  bisoprolol (ZEBETA) tablet 5 mg, 5 mg, Oral, Daily, Jettie Booze, MD, 5 mg at 02/07/18 1332 .  calcitRIOL (ROCALTROL) capsule 0.25 mcg, 0.25 mcg, Oral, QODAY, Jettie Booze, MD, 0.25 mcg at 02/06/18 0905 .  calcitRIOL (ROCALTROL) capsule 0.5 mcg, 0.5 mcg, Oral, QODAY, Jettie Booze, MD, 0.5 mcg at 02/07/18 1333 .  fluticasone (FLONASE) 50 MCG/ACT nasal spray 2 spray, 2 spray, Each Nare, Daily PRN, [name] S, MD .  Influenza vac split quadrivalent PF (FLUZONE HIGH-DOSE) injection 0.5 mL, 0.5 mL, Intramuscular, Tomorrow-1000, Varanasi, Jayadeep S, MD .  isosorbide mononitrate (IMDUR) 24 hr tablet 30 mg, 30 mg, Oral, Daily, [name] S, MD, 30 mg at 02/07/18 1332 .  levothyroxine (SYNTHROID, LEVOTHROID) tablet 175 mcg, 175 mcg, Oral, QAC breakfast, Jettie Booze, MD, 175 mcg at 02/07/18 0846 .  MEDLINE mouth rinse, 15 mL, Mouth Rinse, BID, Jettie Booze, MD, 15 mL at 02/07/18 1021 .  multivitamin with minerals tablet 1 tablet, 1 tablet, Oral, Daily, Jettie Booze, MD, 1 tablet at 02/07/18 1332 .  ondansetron (ZOFRAN) injection 4 mg, 4 mg, Intravenous, Q6H PRN, [name] S, MD .  pantoprazole (PROTONIX) EC tablet 40 mg, 40 mg, Oral, Daily, Jettie Booze, MD, 40 mg at 02/07/18 1019 .  polyethylene glycol (MIRALAX / GLYCOLAX) packet 17 g, 17 g, Oral, Daily, Jettie Booze, MD, 17 g at 02/07/18 1334 .  potassium chloride SA (K-DUR,KLOR-CON) CR tablet 20 mEq, 20 mEq, Oral, Daily, Jettie Booze, MD, 20 mEq at 02/07/18 1332 .  rosuvastatin (CRESTOR) tablet 20 mg, 20 mg, Oral, Daily, Jettie Booze, MD, 20 mg at 02/07/18 1332 .  senna-docusate (Senokot-S) tablet 1 tablet, 1 tablet, Oral, BID, Jettie Booze, MD, 1 tablet at 02/07/18 1333 .  sodium  chloride flush (NS) 0.9 % injection 3 mL, 3 mL, Intravenous, Q12H, Jettie Booze, MD, 3 mL at 02/06/18 2026 .  sodium chloride flush (NS) 0.9 % injection 3 mL, 3 mL, Intravenous, PRN, [name] S, MD .  sodium chloride flush (NS) 0.9 % injection 3 mL, 3 mL, Intravenous, Q12H, Jettie Booze, MD, 3 mL at 02/07/18 1335 .  sodium chloride flush (NS) 0.9 % injection 3 mL, 3 mL, Intravenous, PRN, Jettie Booze, MD .  umeclidinium-vilanterol [facility] ELLIPTA) 62.5-25 MCG/INH 1 puff, 1 puff, Inhalation, Daily, Jettie Booze, MD, 1 puff at 02/07/18 [id number] .  warfarin (COUMADIN) tablet 5 mg, 5 mg, Oral, ONCE-1800, Kris Mouton, RPH .  Warfarin - Pharmacist Dosing Inpatient, , Does not apply, q1800, Kris Mouton, [facility]     LABS:  CBC Latest Ref Rng & Units 02/07/2018 02/04/2018 02/03/2018  WBC 4.0 - 10.5 K/uL 9.9 4.8 5.2  Hemoglobin 13.0 - 17.0 g/dL 11.0(L) 10.9(L) 10.5(L)  Hematocrit 39.0 - 52.0 % 36.9(L) 37.1(L) 35.2(L)  Platelets 150 - 400 K/uL 153 158 158    CMP Latest Ref Rng & Units 02/07/2018 02/06/2018 02/05/2018  Glucose 70 - 99 mg/dL 103(H) 99 91  BUN 8 - 23 mg/dL 72(H) 75(H) 65(H)  Creatinine 0.61 - 1.24 mg/dL 2.90(H) 2.78(H) 2.46(H)  Sodium 135 - 145 mmol/L 141 142 142  Potassium 3.5 - 5.1 mmol/L 4.0 4.6 4.1  Chloride 98 - 111 mmol/L 87(L) 93(L) 92(L)  CO2 22 - 32 mmol/L 42(H) 39(H) 40(H)  Calcium 8.9 - 10.3 mg/dL 9.8 9.6 9.6  Total Protein 6.5 - 8.1 g/dL - - -  Total Bilirubin 0.3 - 1.2 mg/dL - - -  Alkaline Phos 38 - 126 U/L - - -  AST 15 - 41 U/L - - -  ALT 0 - 44 U/L - - -    Lab Results  Component Value Date   PTH 92 (H) 11/05/2016   CALCIUM 9.8 02/07/2018   PHOS 4.6 11/09/2016       Component Value Date/Time   COLORURINE YELLOW 10/27/2016 0113   APPEARANCEUR CLEAR 10/27/2016 0113   LABSPEC 1.013 10/27/2016 0113   PHURINE 6.0 10/27/2016 0113   GLUCOSEU NEGATIVE 10/27/2016 0113   HGBUR NEGATIVE 10/27/2016 0113   BILIRUBINUR NEGATIVE  10/27/2016 0113   KETONESUR NEGATIVE 10/27/2016 0113   PROTEINUR 100 (A) 10/27/2016 0113   UROBILINOGEN 0.2 06/17/2012 0622   NITRITE NEGATIVE 10/27/2016 0113   LEUKOCYTESUR NEGATIVE 10/27/2016 0113      Component Value Date/Time   PHART 7.378 02/04/2018 1120   PCO2ART 70.3 (HH) 02/04/2018 1120   PO2ART 68.1 (L) 02/04/2018 1120   HCO3 40.4 (H) 02/04/2018 1120   TCO2 45 (H) 11/24/2017 0213   ACIDBASEDEF 6.0 (H) 10/28/2016 1042   O2SAT 92.0 02/04/2018 1120       Component Value Date/Time   IRON 33 (L) 11/05/2016 1225   TIBC 305 11/05/2016 1225   FERRITIN 62.5 11/18/2012 1459   IRONPCTSAT 11 (L) 11/05/2016 1225       ASSESSMENT/PLAN:     Problem List Items Addressed This Visit      Cardiovascular and Mediastinum   Acute on chronic congestive heart failure (HCC)   Relevant Medications   furosemide (LASIX) injection 80 mg (Completed)   isosorbide mononitrate (IMDUR) 24 hr tablet 30 mg   rosuvastatin (CRESTOR) tablet 20 mg   furosemide (LASIX) 120 mg in dextrose 5 % 50 mL IVPB (Completed)   warfarin (COUMADIN) tablet 4 mg (Completed)   warfarin (COUMADIN) tablet 4 mg (Completed)   warfarin (COUMADIN) tablet 5 mg (Completed)   bisoprolol (ZEBETA) tablet 5 mg   warfarin (COUMADIN) tablet 2.5 mg (Completed)   metolazone (ZAROXOLYN) tablet 2.5 mg (Completed)   aspirin chewable tablet 81 mg (Completed)   warfarin (COUMADIN) tablet 5 mg (Start on 02/07/2018  6:00 PM)   Warfarin - Pharmacist Dosing Inpatient (Start on 02/07/2018  6:00 PM)     Respiratory   COPD exacerbation (HCC) - Primary   Relevant Medications   ipratropium-albuterol (DUONEB) 0.5-2.5 (3) MG/3ML nebulizer solution 3 mL (Completed)   methylPREDNISolone sodium succinate (SOLU-MEDROL) 125 mg/2 mL injection 125 mg (Completed)   azithromycin (ZITHROMAX) 500 mg  in sodium chloride 0.9 % 250 mL IVPB (Completed)   fluticasone (FLONASE) 50 MCG/ACT nasal spray 2 spray   umeclidinium-vilanterol (ANORO ELLIPTA) 62.5-25  MCG/INH 1 puff   albuterol (PROVENTIL) (2.5 MG/3ML) 0.083% nebulizer solution 2.5 mg     Other   Shortness of breath   Relevant Orders   DG Chest 2 View (Completed)    Other Visit Diagnoses    Dyspnea       Relevant Orders   DG Chest 2 View (Completed)   DG Chest 2 View (Completed)      1.  Acute diastolic CHF.  Agree with increase in diuretics.  Will monitor renal function 2.  CKD IV.  Appears to be around baseline of 2.5.  Likely due to DM/HTN with (+)proteinuria.  Needs to follow up as outpatient. 3.  HTN.  BP control excellent.  Would avoid hypotension while trying to diurese 4.  DM.  Continue current Rx.    [place], DO, [place]

## 2018-02-07 NOTE — Progress Notes (Signed)
MD notified of critical value Pco2 of 66.6. No new orders at this time.  Mayuri Staples, Mervin Kung RN

## 2018-02-08 LAB — BASIC METABOLIC PANEL
Anion gap: 12 (ref 5–15)
BUN: 77 mg/dL — ABNORMAL HIGH (ref 8–23)
CO2: 41 mmol/L — ABNORMAL HIGH (ref 22–32)
Calcium: 9.7 mg/dL (ref 8.9–10.3)
Chloride: 89 mmol/L — ABNORMAL LOW (ref 98–111)
Creatinine, Ser: 2.94 mg/dL — ABNORMAL HIGH (ref 0.61–1.24)
GFR calc Af Amer: 23 mL/min — ABNORMAL LOW (ref >60)
GFR calc non Af Amer: 19 mL/min — ABNORMAL LOW (ref >60)
Glucose, Bld: 113 mg/dL — ABNORMAL HIGH (ref 70–99)
POTASSIUM: 4.1 mmol/L (ref 3.5–5.1)
Sodium: 142 mmol/L (ref 135–145)

## 2018-02-08 LAB — CBC
HCT: 38.9 % — ABNORMAL LOW (ref 39.0–52.0)
HEMOGLOBIN: 11.7 g/dL — AB (ref 13.0–17.0)
MCH: 32.6 pg (ref 26.0–34.0)
MCHC: 30.1 g/dL (ref 30.0–36.0)
MCV: 108.4 fL — ABNORMAL HIGH (ref 80.0–100.0)
Platelets: 122 10*3/uL — ABNORMAL LOW (ref 150–400)
RBC: 3.59 MIL/uL — ABNORMAL LOW (ref 4.22–5.81)
RDW: 17.3 % — ABNORMAL HIGH (ref 11.5–15.5)
WBC: 12.5 10*3/uL — ABNORMAL HIGH (ref 4.0–10.5)
nRBC: 0 % (ref 0.0–0.2)

## 2018-02-08 LAB — PROTIME-INR
INR: 1.72
Prothrombin Time: 19.9 seconds — ABNORMAL HIGH (ref 11.4–15.2)

## 2018-02-08 MED ORDER — WARFARIN SODIUM 5 MG PO TABS
5.0000 mg | ORAL_TABLET | Freq: Once | ORAL | Status: AC
  Administered 2018-02-08: 5 mg via ORAL
  Filled 2018-02-08: qty 1

## 2018-02-08 MED ORDER — FUROSEMIDE 10 MG/ML IJ SOLN
120.0000 mg | Freq: Two times a day (BID) | INTRAVENOUS | Status: DC
  Administered 2018-02-08 – 2018-02-12 (×9): 120 mg via INTRAVENOUS
  Filled 2018-02-08 (×4): qty 10
  Filled 2018-02-08 (×3): qty 12
  Filled 2018-02-08 (×3): qty 10

## 2018-02-08 NOTE — Progress Notes (Signed)
Attempted calling patient's wife to inform her that the patient will be tried out of restraints to place Cpap. Received no answer.

## 2018-02-08 NOTE — Progress Notes (Signed)
Patient ID: ZARION OLIFF, male   DOB: January 02, 1941, 78 y.o.   MRN: [id number]   Progress Note  Patient Name: KAELYN NAUTA Date of Encounter: 02/08/2018  Primary Cardiologist: [name], MD   Subjective   Still has orthopnea.  No dyspnea walking in room.   RHC (1/3):  Mean RA 22 PA 49/21 Mean PCWP 20 CI 2.4  Echo: EF 50-55%, severe LVH, d-shaped septum, RV mildly dilated, PASP 62 mmHg.   Inpatient Medications    Scheduled Meds: . allopurinol  300 mg Oral Daily  . bisoprolol  5 mg Oral Daily  . calcitRIOL  0.25 mcg Oral QODAY  . calcitRIOL  0.5 mcg Oral QODAY  . Influenza vac split quadrivalent PF  0.5 mL Intramuscular Tomorrow-1000  . isosorbide mononitrate  30 mg Oral Daily  . levothyroxine  175 mcg Oral QAC breakfast  . mouth rinse  15 mL Mouth Rinse BID  . multivitamin with minerals  1 tablet Oral Daily  . pantoprazole  40 mg Oral Daily  . polyethylene glycol  17 g Oral Daily  . potassium chloride SA  20 mEq Oral Daily  . rosuvastatin  20 mg Oral Daily  . senna-docusate  1 tablet Oral BID  . sodium chloride flush  3 mL Intravenous Q12H  . sodium chloride flush  3 mL Intravenous Q12H  . umeclidinium-vilanterol  1 puff Inhalation Daily  . Warfarin - Pharmacist Dosing Inpatient   Does not apply q1800   Continuous Infusions: . sodium chloride Stopped (02/04/18 0004)  . sodium chloride    . furosemide     PRN Meds: sodium chloride, sodium chloride, acetaminophen, albuterol, fluticasone, ondansetron (ZOFRAN) IV, sodium chloride flush, sodium chloride flush   Vital Signs    Vitals:   02/08/18 0314 02/08/18 0500 02/08/18 0800 02/08/18 0824  BP: 120/69  112/64   Pulse: 71     Resp: (!) 30     Temp: 98.7 F (37.1 C)  98.7 F (37.1 C)   TempSrc: Axillary  Axillary   SpO2: 97%   96%  Weight:  97.7 kg    Height:        Intake/Output Summary (Last 24 hours) at 02/08/2018 1037 Last data filed at 02/08/2018 1000 Gross per 24 hour  Intake 563 ml  Output 1525 ml    Net -962 ml   Filed Weights   02/06/18 0500 02/07/18 0547 02/08/18 0500  Weight: 100.7 kg 103.2 kg 97.7 kg    Telemetry    Atrial fibrillation in 70s - Personally Reviewed  Physical Exam   General: NAD Neck: JVP 16 cm, no thyromegaly or thyroid nodule.  Lungs: Mild crackles at bases.  CV: Nondisplaced PMI.  Heart irregular S1/S2, no S3/S4, no murmur.  1+ ankle edema.   Abdomen: Soft, nontender, no hepatosplenomegaly, no distention.  Skin: Intact without lesions or rashes.  Neurologic: Alert and oriented x 3.  Psych: Normal affect. Extremities: No clubbing or cyanosis.  HEENT: Normal.    Labs    Chemistry Recent Labs  Lab 02/06/18 0254 02/07/18 0351 02/08/18 0811  NA 142 141 142  K 4.6 4.0 4.1  CL 93* 87* 89*  CO2 39* 42* 41*  GLUCOSE 99 103* 113*  BUN 75* 72* 77*  CREATININE 2.78* 2.90* 2.94*  CALCIUM 9.6 9.8 9.7  GFRNONAA 21* 20* 19*  GFRAA 24* 23* 23*  ANIONGAP 10 12 12      Hematology Recent Labs  Lab 02/04/18 0329 02/07/18 0351 02/08/18 0225  WBC  4.8 9.9 12.5*  RBC 3.39* 3.37* 3.59*  HGB 10.9* 11.0* 11.7*  HCT 37.1* 36.9* 38.9*  MCV 109.4* 109.5* 108.4*  MCH 32.2 32.6 32.6  MCHC 29.4* 29.8* 30.1  RDW 17.1* 17.1* 17.3*  PLT 158 153 122*    Cardiac EnzymesNo results for input(s): TROPONINI in the last 168 hours. No results for input(s): TROPIPOC in the last 168 hours.   BNPNo results for input(s): BNP, PROBNP in the last 168 hours.   DDimer No results for input(s): DDIMER in the last 168 hours.   Radiology    Ct Head Wo Contrast  Result Date: 02/07/2018 CLINICAL DATA:  Unexplained altered level of consciousness EXAM: CT HEAD WITHOUT CONTRAST TECHNIQUE: Contiguous axial images were obtained from the base of the skull through the vertex without intravenous contrast. COMPARISON:  12/02/2017 FINDINGS: Brain: No evidence of acute infarction, hemorrhage, hydrocephalus, extra-axial collection or mass lesion/mass effect. On a few slices the variable  density of the pons is attributed to streak artifact based on reformats. Age normal brain volume. Vascular: Atherosclerotic calcification Skull: Negative Sinuses/Orbits: Bilateral cataract resection. IMPRESSION: Unremarkable head CT for age. Electronically Signed   By: Monte Fantasia M.D.   On: 02/07/2018 15:20    Assessment & Plan    1. Acute on chronic diastolic CHF: RHC as above with elevated R>L heart filling pressures suggesting significant RV failure.  Echo with EF 50-55%, severe LVH, D-shaped interventricular septum.  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  On exam, he remains volume overloaded.  Diuresis is going to be complicated by CKD stage IV.   - Restart Lasix 120 mg IV bid and followup UOP.  - Needs workup for cardiac amyloidosis.  Will arrange for PYP scan and myeloma panel.  No cardiac MRI as we cannot give contrast with renal failure.  2. CKD stage IV: Creatinine fairly stable at 2.94.  Follow closely with diuresis.  3. Atrial fibrillation: Permanent.  Continue warfarin.  4. Elevated troponin: Mild troponin elevation is likely demand ischemia, doubt ACS.   For questions or updates, please contact [place] Please consult [email or web address] for contact info under     Signed, Loralie Champagne, MD  02/08/2018, 10:37 AM

## 2018-02-08 NOTE — Progress Notes (Signed)
Removed patient's restraints after learning from RT that he couldn't be placed on his cpap while in restraints. The necessity for his cpap was greater than keeping him in restraints despite his confusion and attempts to remove his cpap, condom cath and climb out of bed. I remained outside of the patient's room in order to respond to his bed alarm and monitor alarms for desaturation as soon as possible.

## 2018-02-08 NOTE — Progress Notes (Signed)
PROGRESS NOTE    Omar Lambert  ZYS:[id number] DOB: 10-22-1940 DOA: 01/31/2018 PCP: Gayland Curry, DO    Brief Narrative: Omar Lambert is a 78 y.o. male with medical history significant of DM2, Hypothyroidism, HTN, Gout, GERD, COPD, CKD, diastolic CHF, asthma who presents for SOB and swelling. Pt admitted for acute on chronic diastolic heart failure.    Assessment & Plan:   Active Problems:   Benign essential hypertension   COPD exacerbation (HCC)   Esophageal reflux   Chronic kidney disease (CKD) stage G3b/A2, moderately decreased glomerular filtration rate (GFR) between 30-44 mL/min/1.73 square meter and albuminuria creatinine ratio between 30-299 mg/g (HCC)   Persistent atrial fibrillation   Hypothyroidism   Diabetes mellitus with renal complications (HCC)   Shortness of breath   Acute on chronic congestive heart failure (HCC)   Acute on chronic diastolic (congestive) heart failure (HCC)   Acute on chronic diastolic heart failure Admitted to stepdown .  Probably secondary to noncompliance to fluid and salt restriction. Patient continues to be fluid overloaded requiring IV Lasix 120 mg every 8 hours, held this morning for worsening renal parameters. Continue with strict intake and output and daily weights and monitor renal parameters while on IV Lasix.    Repeat echocardiogram showed Wall thickness wasincreased in a pattern of severe LVH. Systolic function was normal. The estimated ejection fraction was in the range of 50%  to 55%. Wall motion was normal; there were no regional wall  motion abnormalities. The study was not technically sufficient to allow evaluation of LV diastolic dysfunction due to atrial fibrillation. Cardiology consulted. He will probably need RHC for further evaluation.  Underwent RHC, showing elevated right filling pressures, with sig RV failure.  Restarted lasix 120 IV BID.  Use BiPAP as needed and nasal cannula oxygen to keep sats greater than 90%. He  has diuresed about 11 lit of since admission .     Elevated troponins probably from demand ischemia from acute CHF.    Essential hypertension Well-controlled Continue home medication    Mild acute on Stage III chronic kidney disease Watch renal parameters while on IV diuretics. Creatinine at 2.9 and stable.  Nephrology consulted for recommendations.   Persistent atrial fibrillation Rate controlled IV heparin for cardiac cath.    Type 2 diabetes mellitus with renal complications  CBG (last 3)  Recent Labs    02/05/18 1633 02/05/18 2127 02/07/18 0747  GLUCAP 114* 97 88   well controlled, continue with sliding scale insulin. No changes in medications.     Hyperlipidemia Continue with Crestor  Patient slipped from the chair :  Patient denies any new complaints but he appears confused and lethargic.  we will get CT head without contrast, no acute abnormality.     DVT prophylaxis: Heparin Code Status: full code.  Family Communication: Wife at bedside Disposition Plan:pending clinical improvement and cardiology recommendations.   Consultants:  Cardiology. Nephrology Dr. Johnney Ou Palliative care   Procedures: Echocardiogram.      Antimicrobials: none.    Subjective: No chest pain . Continues to require oxygen and dyspneic on talking. Still very lethargic.   Objective: Vitals:   02/08/18 0500 02/08/18 0800 02/08/18 0824 02/08/18 1139  BP:  112/64  124/77  Pulse:    79  Resp:    (!) 29  Temp:  98.7 F (37.1 C)  98.1 F (36.7 C)  TempSrc:  Axillary  Oral  SpO2:   96% 99%  Weight: 97.7 kg  Height:        Intake/Output Summary (Last 24 hours) at 02/08/2018 1516 Last data filed at 02/08/2018 1254 Gross per 24 hour  Intake 662 ml  Output 775 ml  Net -113 ml   Filed Weights   02/06/18 0500 02/07/18 0547 02/08/18 0500  Weight: 100.7 kg 103.2 kg 97.7 kg    Examination:  General exam: Lethargic, not in any kind of distress, on 2 to 3 lit    Respiratory system: diminished at bases. No wheezing or rhonchi.  Cardiovascular system: S1 & S2 heard, irregular, JVD present.  Gastrointestinal system: Abdomen is  Soft, NT ND BS+ Central nervous system: Lethargic, slightly confused. Extremities:able to move all extremities.  2+ pedal edema present.  Skin: No rashes, lesions or ulcers Psychiatry:  Mood & affect appropriate.     Data Reviewed: I have personally reviewed following labs and imaging studies  CBC: Recent Labs  Lab 02/02/18 0335 02/03/18 0222 02/04/18 0329 02/07/18 0351 02/08/18 0225  WBC 7.3 5.2 4.8 9.9 12.5*  HGB 10.2* 10.5* 10.9* 11.0* 11.7*  HCT 35.2* 35.2* 37.1* 36.9* 38.9*  MCV 107.0* 108.3* 109.4* 109.5* 108.4*  PLT 149* 158 158 153 683*   Basic Metabolic Panel: Recent Labs  Lab 02/04/18 0329 02/05/18 0243 02/06/18 0254 02/07/18 0351 02/08/18 0811  NA 141 142 142 141 142  K 4.2 4.1 4.6 4.0 4.1  CL 90* 92* 93* 87* 89*  CO2 38* 40* 39* 42* 41*  GLUCOSE 105* 91 99 103* 113*  BUN 60* 65* 75* 72* 77*  CREATININE 2.53* 2.46* 2.78* 2.90* 2.94*  CALCIUM 9.6 9.6 9.6 9.8 9.7  MG  --  2.6*  --   --   --    GFR: Estimated Creatinine Clearance: 23.9 mL/min (A) (by C-G formula based on SCr of 2.94 mg/dL (H)). Liver Function Tests: No results for input(s): AST, ALT, ALKPHOS, BILITOT, PROT, ALBUMIN in the last 168 hours. No results for input(s): LIPASE, AMYLASE in the last 168 hours. No results for input(s): AMMONIA in the last 168 hours. Coagulation Profile: Recent Labs  Lab 02/04/18 0329 02/05/18 0243 02/06/18 0254 02/07/18 0351 02/08/18 0225  INR 2.02 2.16 1.98 1.92 1.72   Cardiac Enzymes: No results for input(s): CKTOTAL, CKMB, CKMBINDEX, TROPONINI in the last 168 hours. BNP (last 3 results) No results for input(s): PROBNP in the last 8760 hours. HbA1C: No results for input(s): HGBA1C in the last 72 hours. CBG: Recent Labs  Lab 02/05/18 0838 02/05/18 1137 02/05/18 1633 02/05/18 2127  02/07/18 0747  GLUCAP 84 92 114* 97 88   Lipid Profile: No results for input(s): CHOL, HDL, LDLCALC, TRIG, CHOLHDL, LDLDIRECT in the last 72 hours. Thyroid Function Tests: No results for input(s): TSH, T4TOTAL, FREET4, T3FREE, THYROIDAB in the last 72 hours. Anemia Panel: Recent Labs    02/07/18 0351  VITAMINB12 386  FOLATE 32.1   Sepsis Labs: No results for input(s): PROCALCITON, LATICACIDVEN in the last 168 hours.  Recent Results (from the past 240 hour(s))  Blood culture (routine x 2)     Status: None   Collection Time: 01/31/18 12:28 PM  Result Value Ref Range Status   Specimen Description BLOOD RIGHT ANTECUBITAL  Final   Special Requests   Final    BOTTLES DRAWN AEROBIC AND ANAEROBIC Blood Culture adequate volume   Culture   Final    NO GROWTH 5 DAYS Performed at [facility], [address], [place], [place] [address]    Report Status 02/05/2018 FINAL  Final  Blood culture (routine x 2)     Status: None   Collection Time: 01/31/18  1:41 PM  Result Value Ref Range Status   Specimen Description BLOOD LEFT ANTECUBITAL  Final   Special Requests   Final    BOTTLES DRAWN AEROBIC AND ANAEROBIC Blood Culture adequate volume   Culture   Final    NO GROWTH 5 DAYS Performed at [facility], [address], [place], [place] [address]    Report Status 02/05/2018 FINAL  Final  MRSA PCR Screening     Status: None   Collection Time: 01/31/18  9:38 PM  Result Value Ref Range Status   MRSA by PCR NEGATIVE NEGATIVE Final    Comment:        The GeneXpert MRSA Assay (FDA approved for NASAL specimens only), is one component of a comprehensive MRSA colonization surveillance program. It is not intended to diagnose MRSA infection nor to guide or monitor treatment for MRSA infections. Performed at [facility], [place] [address] [place], [place] [address]          Radiology Studies: Ct Head Wo Contrast  Result Date: 02/07/2018 CLINICAL DATA:  Unexplained  altered level of consciousness EXAM: CT HEAD WITHOUT CONTRAST TECHNIQUE: Contiguous axial images were obtained from the base of the skull through the vertex without intravenous contrast. COMPARISON:  12/02/2017 FINDINGS: Brain: No evidence of acute infarction, hemorrhage, hydrocephalus, extra-axial collection or mass lesion/mass effect. On a few slices the variable density of the pons is attributed to streak artifact based on reformats. Age normal brain volume. Vascular: Atherosclerotic calcification Skull: Negative Sinuses/Orbits: Bilateral cataract resection. IMPRESSION: Unremarkable head CT for age. Electronically Signed   By: Monte Fantasia M.D.   On: 02/07/2018 15:20        Scheduled Meds: . allopurinol  300 mg Oral Daily  . bisoprolol  5 mg Oral Daily  . calcitRIOL  0.25 mcg Oral QODAY  . calcitRIOL  0.5 mcg Oral QODAY  . Influenza vac split quadrivalent PF  0.5 mL Intramuscular Tomorrow-1000  . isosorbide mononitrate  30 mg Oral Daily  . levothyroxine  175 mcg Oral QAC breakfast  . mouth rinse  15 mL Mouth Rinse BID  . multivitamin with minerals  1 tablet Oral Daily  . pantoprazole  40 mg Oral Daily  . polyethylene glycol  17 g Oral Daily  . potassium chloride SA  20 mEq Oral Daily  . rosuvastatin  20 mg Oral Daily  . senna-docusate  1 tablet Oral BID  . sodium chloride flush  3 mL Intravenous Q12H  . sodium chloride flush  3 mL Intravenous Q12H  . umeclidinium-vilanterol  1 puff Inhalation Daily  . warfarin  5 mg Oral ONCE-1800  . Warfarin - Pharmacist Dosing Inpatient   Does not apply q1800   Continuous Infusions: . sodium chloride Stopped (02/04/18 0004)  . sodium chloride    . furosemide 120 mg (02/08/18 1045)     LOS: 8 days    Time spent:32  minutes.     Hosie Poisson, MD [facility] Pager [phone]  If 7PM-7AM, please contact night-coverage [email or web address] Password [email or web address] 02/08/2018, 3:16 PM

## 2018-02-08 NOTE — Progress Notes (Signed)
[facility]    NEPHROLOGY PROGRESS NOTE  SUBJECTIVE: No acute complaints today.  Awake, denies sob and cp.  Confused to time, place, and events.  Eating breakfast during exam.  Good UOP noted.     OBJECTIVE:  Vitals:   02/08/18 0800 02/08/18 0824  BP: 112/64   Pulse:    Resp:    Temp: 98.7 F (37.1 C)   SpO2:  96%   I/O last 3 completed shifts: In: 504.3 [P.O.:320; I.V.:134.3; IV Piggyback:50] Out: 2975 [Urine:2975]   Genearl:  Alert, confused, NAD HEENT: MMM [place] AT anicteric sclera Neck:  No JVD, no adenopathy CV:  Heart RRR  Lungs:  L/S with bibasilar rales Abd:  abd SNT/ND with normal BS GU:  Bladder non-palpable Extremities:  (+)1 bilateral LE edema. Skin:  No skin rash  MEDICATIONS:   Current Facility-Administered Medications:  .  0.9 %  sodium chloride infusion, 250 mL, Intravenous, PRN, Jettie Booze, MD, Stopped at 02/04/18 0004 .  0.9 %  sodium chloride infusion, 250 mL, Intravenous, PRN, Jettie Booze, MD .  acetaminophen (TYLENOL) tablet 650 mg, 650 mg, Oral, Q6H PRN, [name] S, MD .  albuterol (PROVENTIL) (2.5 MG/3ML) 0.083% nebulizer solution 2.5 mg, 2.5 mg, Nebulization, Q2H PRN, [name] S, MD .  allopurinol (ZYLOPRIM) tablet 300 mg, 300 mg, Oral, Daily, [name] S, MD, 300 mg at 02/08/18 1005 .  bisoprolol (ZEBETA) tablet 5 mg, 5 mg, Oral, Daily, Jettie Booze, MD, 5 mg at 02/08/18 1005 .  calcitRIOL (ROCALTROL) capsule 0.25 mcg, 0.25 mcg, Oral, QODAY, Jettie Booze, MD, 0.25 mcg at 02/08/18 1007 .  calcitRIOL (ROCALTROL) capsule 0.5 mcg, 0.5 mcg, Oral, QODAY, Jettie Booze, MD, 0.5 mcg at 02/07/18 1333 .  fluticasone (FLONASE) 50 MCG/ACT nasal spray 2 spray, 2 spray, Each Nare, Daily PRN, Jettie Booze, MD .  furosemide (LASIX) 120 mg in dextrose 5 % 50 mL IVPB, 120 mg, Intravenous, BID, Larey Dresser, MD .  Influenza vac split quadrivalent PF (FLUZONE HIGH-DOSE)  injection 0.5 mL, 0.5 mL, Intramuscular, Tomorrow-1000, Varanasi, Jayadeep S, MD .  isosorbide mononitrate (IMDUR) 24 hr tablet 30 mg, 30 mg, Oral, Daily, [name] S, MD, 30 mg at 02/08/18 1005 .  levothyroxine (SYNTHROID, LEVOTHROID) tablet 175 mcg, 175 mcg, Oral, QAC breakfast, Jettie Booze, MD, 175 mcg at 02/08/18 0559 .  MEDLINE mouth rinse, 15 mL, Mouth Rinse, BID, Jettie Booze, MD, 15 mL at 02/08/18 1000 .  multivitamin with minerals tablet 1 tablet, 1 tablet, Oral, Daily, Jettie Booze, MD, 1 tablet at 02/08/18 1005 .  ondansetron (ZOFRAN) injection 4 mg, 4 mg, Intravenous, Q6H PRN, [name] S, MD .  pantoprazole (PROTONIX) EC tablet 40 mg, 40 mg, Oral, Daily, Jettie Booze, MD, 40 mg at 02/08/18 1005 .  polyethylene glycol (MIRALAX / GLYCOLAX) packet 17 g, 17 g, Oral, Daily, Jettie Booze, MD, Stopped at 02/08/18 1039 .  potassium chloride SA (K-DUR,KLOR-CON) CR tablet 20 mEq, 20 mEq, Oral, Daily, Jettie Booze, MD, 20 mEq at 02/08/18 1005 .  rosuvastatin (CRESTOR) tablet 20 mg, 20 mg, Oral, Daily, Jettie Booze, MD, 20 mg at 02/08/18 1008 .  senna-docusate (Senokot-S) tablet 1 tablet, 1 tablet, Oral, BID, Jettie Booze, MD, 1 tablet at 02/08/18 1008 .  sodium chloride flush (NS) 0.9 % injection 3 mL, 3 mL, Intravenous, Q12H, Jettie Booze, MD, 3 mL at 02/06/18 2026 .  sodium chloride flush (NS)  0.9 % injection 3 mL, 3 mL, Intravenous, PRN, [name] S, MD .  sodium chloride flush (NS) 0.9 % injection 3 mL, 3 mL, Intravenous, Q12H, Jettie Booze, MD, 3 mL at 02/08/18 0603 .  sodium chloride flush (NS) 0.9 % injection 3 mL, 3 mL, Intravenous, PRN, Jettie Booze, MD .  umeclidinium-vilanterol (ANORO ELLIPTA) 62.5-25 MCG/INH 1 puff, 1 puff, Inhalation, Daily, Jettie Booze, MD, 1 puff at 02/08/18 [phone] .  Warfarin - Pharmacist Dosing Inpatient, , Does not apply, q1800, Kris Mouton, [facility]     LABS:  CBC Latest Ref Rng & Units 02/08/2018 02/07/2018 02/04/2018  WBC 4.0 - 10.5 K/uL 12.5(H) 9.9 4.8  Hemoglobin 13.0 - 17.0 g/dL 11.7(L) 11.0(L) 10.9(L)  Hematocrit 39.0 - 52.0 % 38.9(L) 36.9(L) 37.1(L)  Platelets 150 - 400 K/uL 122(L) 153 158    CMP Latest Ref Rng & Units 02/08/2018 02/07/2018 02/06/2018  Glucose 70 - 99 mg/dL 113(H) 103(H) 99  BUN 8 - 23 mg/dL 77(H) 72(H) 75(H)  Creatinine 0.61 - 1.24 mg/dL 2.94(H) 2.90(H) 2.78(H)  Sodium 135 - 145 mmol/L 142 141 142  Potassium 3.5 - 5.1 mmol/L 4.1 4.0 4.6  Chloride 98 - 111 mmol/L 89(L) 87(L) 93(L)  CO2 22 - 32 mmol/L 41(H) 42(H) 39(H)  Calcium 8.9 - 10.3 mg/dL 9.7 9.8 9.6  Total Protein 6.5 - 8.1 g/dL - - -  Total Bilirubin 0.3 - 1.2 mg/dL - - -  Alkaline Phos 38 - 126 U/L - - -  AST 15 - 41 U/L - - -  ALT 0 - 44 U/L - - -    Lab Results  Component Value Date   PTH 92 (H) 11/05/2016   CALCIUM 9.7 02/08/2018   PHOS 4.6 11/09/2016       Component Value Date/Time   COLORURINE YELLOW 10/27/2016 0113   APPEARANCEUR CLEAR 10/27/2016 0113   LABSPEC 1.013 10/27/2016 0113   PHURINE 6.0 10/27/2016 0113   GLUCOSEU NEGATIVE 10/27/2016 0113   HGBUR NEGATIVE 10/27/2016 0113   BILIRUBINUR NEGATIVE 10/27/2016 0113   KETONESUR NEGATIVE 10/27/2016 0113   PROTEINUR 100 (A) 10/27/2016 0113   UROBILINOGEN 0.2 06/17/2012 0622   NITRITE NEGATIVE 10/27/2016 0113   LEUKOCYTESUR NEGATIVE 10/27/2016 0113      Component Value Date/Time   PHART 7.433 02/07/2018 1916   PCO2ART 66.6 (HH) 02/07/2018 1916   PO2ART 70.6 (L) 02/07/2018 1916   HCO3 43.8 (H) 02/07/2018 1916   TCO2 >50 (H) 02/07/2018 1255   ACIDBASEDEF 6.0 (H) 10/28/2016 1042   O2SAT 94.0 02/07/2018 1916       Component Value Date/Time   IRON 33 (L) 11/05/2016 1225   TIBC 305 11/05/2016 1225   FERRITIN 62.5 11/18/2012 1459   IRONPCTSAT 11 (L) 11/05/2016 1225       ASSESSMENT/PLAN:     Problem List Items Addressed This Visit      Cardiovascular and  Mediastinum   Acute on chronic congestive heart failure (HCC)   Relevant Medications   furosemide (LASIX) injection 80 mg (Completed)   isosorbide mononitrate (IMDUR) 24 hr tablet 30 mg   rosuvastatin (CRESTOR) tablet 20 mg   furosemide (LASIX) 120 mg in dextrose 5 % 50 mL IVPB (Completed)   warfarin (COUMADIN) tablet 4 mg (Completed)   warfarin (COUMADIN) tablet 4 mg (Completed)   warfarin (COUMADIN) tablet 5 mg (Completed)   bisoprolol (ZEBETA) tablet 5 mg   warfarin (COUMADIN) tablet 2.5 mg (Completed)   metolazone (ZAROXOLYN) tablet 2.5 mg (Completed)  aspirin chewable tablet 81 mg (Completed)   warfarin (COUMADIN) tablet 5 mg (Completed)   Warfarin - Pharmacist Dosing Inpatient   furosemide (LASIX) 120 mg in dextrose 5 % 50 mL IVPB     Respiratory   COPD exacerbation (HCC) - Primary   Relevant Medications   ipratropium-albuterol (DUONEB) 0.5-2.5 (3) MG/3ML nebulizer solution 3 mL (Completed)   methylPREDNISolone sodium succinate (SOLU-MEDROL) 125 mg/2 mL injection 125 mg (Completed)   azithromycin (ZITHROMAX) 500 mg in sodium chloride 0.9 % 250 mL IVPB (Completed)   fluticasone (FLONASE) 50 MCG/ACT nasal spray 2 spray   umeclidinium-vilanterol (ANORO ELLIPTA) 62.5-25 MCG/INH 1 puff   albuterol (PROVENTIL) (2.5 MG/3ML) 0.083% nebulizer solution 2.5 mg     Other   Shortness of breath   Relevant Orders   DG Chest 2 View (Completed)    Other Visit Diagnoses    Dyspnea       Relevant Orders   DG Chest 2 View (Completed)   DG Chest 2 View (Completed)      1.  Acute diastolic CHF.  Agree with diuretics.  Will monitor renal function 2.  CKD IV.  Appears to be around baseline of 2.5.  Likely due to DM/HTN with (+)proteinuria.  Needs to follow up as outpatient. 3.  HTN.  BP control excellent.  Would avoid hypotension while trying to diurese 4.  DM.  Continue current Rx. 5.  AKI.  Mild in the setting of diuresis.  Will continue to monitor.    [place], DO, [place]

## 2018-02-08 NOTE — Progress Notes (Signed)
ANTICOAGULATION CONSULT NOTE - Follow up [place] for coumadin Indication: atrial fibrillation  No Known Allergies  Patient Measurements: Height: 5\' 9"  (175.3 cm) Weight: 215 lb 6.2 oz (97.7 kg) IBW/kg (Calculated) : 70.7  Vital Signs: Temp: 98.1 F (36.7 C) (01/04 1139) Temp Source: Oral (01/04 1139) BP: 124/77 (01/04 1139) Pulse Rate: 79 (01/04 1139)  Labs: Recent Labs    02/06/18 0254 02/06/18 1620 02/07/18 0351 02/08/18 0225 02/08/18 0811  HGB  --   --  11.0* 11.7*  --   HCT  --   --  36.9* 38.9*  --   PLT  --   --  153 122*  --   LABPROT 22.2*  --  21.7* 19.9*  --   INR 1.98  --  1.92 1.72  --   HEPARINUNFRC  --  0.28* 0.53  --   --   CREATININE 2.78*  --  2.90*  --  2.94*    Estimated Creatinine Clearance: 23.9 mL/min (A) (by C-G formula based on SCr of 2.94 mg/dL (H)).   Medical History: Past Medical History:  Diagnosis Date  . Acute kidney injury superimposed on chronic kidney disease ([place]) 11/2016  . Acute respiratory failure ([place]) 11/2016  . Allergy   . Asthma   . Atrial fibrillation ([place])   . 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 Benign essential hypertension   . CHF (congestive heart failure) ([place])   . CKD (chronic kidney disease), stage III ([place])   . COPD (chronic obstructive pulmonary disease) ([place])   . Diabetes ([place])   . Diabetes mellitus without complication ([place])   . Dyspnea   . Gastritis   . GERD (gastroesophageal reflux disease)   . Gout attack 09/2012  . Hypertension   . Sinusitis, maxillary, chronic   . Thyroid disease   . Type II or unspecified type diabetes mellitus without mention of complication, not stated as uncontrolled     Assessment: 71 yoM admitted with SOB and swelling. Pt on warfarin at home for AFib, and pharmacy restart -INR= 1.72  Home dose: 2.5mg  daily except for 5mg  on Mon/Fri  Goal of Therapy:  INR 2-3 Monitor platelets by anticoagulation protocol: Yes   Plan:   -Coumadin 5 mg again today -Daily PT/INR  Marguerite Olea, [facility] Clinical Pharmacist Phone [phone]  02/08/2018 12:19 PM

## 2018-02-09 DIAGNOSIS — Z515 Encounter for palliative care: Secondary | ICD-10-CM

## 2018-02-09 DIAGNOSIS — Z7189 Other specified counseling: Secondary | ICD-10-CM

## 2018-02-09 DIAGNOSIS — N179 Acute kidney failure, unspecified: Secondary | ICD-10-CM

## 2018-02-09 LAB — BASIC METABOLIC PANEL
Anion gap: 14 (ref 5–15)
BUN: 78 mg/dL — ABNORMAL HIGH (ref 8–23)
CO2: 40 mmol/L — ABNORMAL HIGH (ref 22–32)
Calcium: 9.7 mg/dL (ref 8.9–10.3)
Chloride: 88 mmol/L — ABNORMAL LOW (ref 98–111)
Creatinine, Ser: 3.24 mg/dL — ABNORMAL HIGH (ref 0.61–1.24)
GFR calc Af Amer: 20 mL/min — ABNORMAL LOW (ref >60)
GFR calc non Af Amer: 17 mL/min — ABNORMAL LOW (ref >60)
Glucose, Bld: 100 mg/dL — ABNORMAL HIGH (ref 70–99)
POTASSIUM: 4 mmol/L (ref 3.5–5.1)
Sodium: 142 mmol/L (ref 135–145)

## 2018-02-09 LAB — CBC WITH DIFFERENTIAL/PLATELET
Abs Immature Granulocytes: 0.02 10*3/uL (ref 0.00–0.07)
Basophils Absolute: 0 10*3/uL (ref 0.0–0.1)
Basophils Relative: 0 %
Eosinophils Absolute: 0.1 10*3/uL (ref 0.0–0.5)
Eosinophils Relative: 1 %
HEMATOCRIT: 39.9 % (ref 39.0–52.0)
Hemoglobin: 11.3 g/dL — ABNORMAL LOW (ref 13.0–17.0)
IMMATURE GRANULOCYTES: 0 %
Lymphocytes Relative: 6 %
Lymphs Abs: 0.5 10*3/uL — ABNORMAL LOW (ref 0.7–4.0)
MCH: 31.2 pg (ref 26.0–34.0)
MCHC: 28.3 g/dL — ABNORMAL LOW (ref 30.0–36.0)
MCV: 110.2 fL — ABNORMAL HIGH (ref 80.0–100.0)
MONO ABS: 0.7 10*3/uL (ref 0.1–1.0)
Monocytes Relative: 9 %
Neutro Abs: 7 10*3/uL (ref 1.7–7.7)
Neutrophils Relative %: 84 %
Platelets: 144 10*3/uL — ABNORMAL LOW (ref 150–400)
RBC: 3.62 MIL/uL — ABNORMAL LOW (ref 4.22–5.81)
RDW: 17.4 % — ABNORMAL HIGH (ref 11.5–15.5)
WBC: 8.3 10*3/uL (ref 4.0–10.5)
nRBC: 0 % (ref 0.0–0.2)

## 2018-02-09 LAB — PROTIME-INR
INR: 1.69
Prothrombin Time: 19.7 seconds — ABNORMAL HIGH (ref 11.4–15.2)

## 2018-02-09 MED ORDER — WARFARIN SODIUM 7.5 MG PO TABS
7.5000 mg | ORAL_TABLET | Freq: Once | ORAL | Status: AC
  Administered 2018-02-09: 7.5 mg via ORAL
  Filled 2018-02-09: qty 1

## 2018-02-09 MED ORDER — METOLAZONE 5 MG PO TABS
5.0000 mg | ORAL_TABLET | Freq: Once | ORAL | Status: AC
  Administered 2018-02-09: 5 mg via ORAL
  Filled 2018-02-09: qty 1

## 2018-02-09 NOTE — Progress Notes (Signed)
Patient ID: Omar Lambert, male   DOB: 07/15/1940, 78 y.o.   MRN: [id number]   Progress Note  Patient Name: Omar Lambert Date of Encounter: 02/09/2018  Primary Cardiologist: [name], MD   Subjective   Sluggish diuresis yesterday with Lasix 120 mg IV bid.  Creatinine up to 3.24.  No dyspnea at rest but +orthopnea.    RHC (1/3):  Mean RA 22 PA 49/21 Mean PCWP 20 CI 2.4  Echo: EF 50-55%, severe LVH, d-shaped septum, RV mildly dilated, PASP 62 mmHg.   Inpatient Medications    Scheduled Meds: . allopurinol  300 mg Oral Daily  . bisoprolol  5 mg Oral Daily  . calcitRIOL  0.25 mcg Oral QODAY  . calcitRIOL  0.5 mcg Oral QODAY  . Influenza vac split quadrivalent PF  0.5 mL Intramuscular Tomorrow-1000  . isosorbide mononitrate  30 mg Oral Daily  . levothyroxine  175 mcg Oral QAC breakfast  . mouth rinse  15 mL Mouth Rinse BID  . metolazone  5 mg Oral Once  . multivitamin with minerals  1 tablet Oral Daily  . pantoprazole  40 mg Oral Daily  . polyethylene glycol  17 g Oral Daily  . potassium chloride SA  20 mEq Oral Daily  . rosuvastatin  20 mg Oral Daily  . senna-docusate  1 tablet Oral BID  . sodium chloride flush  3 mL Intravenous Q12H  . umeclidinium-vilanterol  1 puff Inhalation Daily  . Warfarin - Pharmacist Dosing Inpatient   Does not apply q1800   Continuous Infusions: . sodium chloride Stopped (02/04/18 0004)  . furosemide 120 mg (02/09/18 0919)   PRN Meds: sodium chloride, acetaminophen, albuterol, fluticasone, ondansetron (ZOFRAN) IV, sodium chloride flush   Vital Signs    Vitals:   02/08/18 2026 02/08/18 2202 02/09/18 0448 02/09/18 0735  BP: 101/72  109/77 105/64  Pulse: 67 65 70 61  Resp: (!) 21 (!) 25 (!) 25 (!) 23  Temp: 97.7 F (36.5 C)  98 F (36.7 C) 98 F (36.7 C)  TempSrc: Oral  Oral Oral  SpO2: 100% 99% 98% 92%  Weight:   98.2 kg   Height:        Intake/Output Summary (Last 24 hours) at 02/09/2018 0944 Last data filed at 02/09/2018  0919 Gross per 24 hour  Intake 1204 ml  Output 1220 ml  Net -16 ml   Filed Weights   02/07/18 0547 02/08/18 0500 02/09/18 0448  Weight: 103.2 kg 97.7 kg 98.2 kg    Telemetry    Atrial fibrillation in 70s - Personally Reviewed  Physical Exam   General: NAD Neck: JVP 16+ cm, no thyromegaly or thyroid nodule.  Lungs: Clear to auscultation bilaterally with normal respiratory effort. CV: Nondisplaced PMI.  Heart irregular S1/S2, no S3/S4, no murmur.  Trace ankle edema.   Abdomen: Soft, nontender, no hepatosplenomegaly, no distention.  Skin: Intact without lesions or rashes.  Neurologic: Alert and oriented x 3.  Psych: Normal affect. Extremities: No clubbing or cyanosis.  HEENT: Normal.   Labs    Chemistry Recent Labs  Lab 02/07/18 0351 02/08/18 0811 02/09/18 0541  NA 141 142 142  K 4.0 4.1 4.0  CL 87* 89* 88*  CO2 42* 41* 40*  GLUCOSE 103* 113* 100*  BUN 72* 77* 78*  CREATININE 2.90* 2.94* 3.24*  CALCIUM 9.8 9.7 9.7  GFRNONAA 20* 19* 17*  GFRAA 23* 23* 20*  ANIONGAP 12 12 14      Hematology Recent Labs  Lab 02/07/18 0351 02/08/18 0225 02/09/18 0541  WBC 9.9 12.5* 8.3  RBC 3.37* 3.59* 3.62*  HGB 11.0* 11.7* 11.3*  HCT 36.9* 38.9* 39.9  MCV 109.5* 108.4* 110.2*  MCH 32.6 32.6 31.2  MCHC 29.8* 30.1 28.3*  RDW 17.1* 17.3* 17.4*  PLT 153 122* 144*    Cardiac EnzymesNo results for input(s): TROPONINI in the last 168 hours. No results for input(s): TROPIPOC in the last 168 hours.   BNPNo results for input(s): BNP, PROBNP in the last 168 hours.   DDimer No results for input(s): DDIMER in the last 168 hours.   Radiology    Ct Head Wo Contrast  Result Date: 02/07/2018 CLINICAL DATA:  Unexplained altered level of consciousness EXAM: CT HEAD WITHOUT CONTRAST TECHNIQUE: Contiguous axial images were obtained from the base of the skull through the vertex without intravenous contrast. COMPARISON:  12/02/2017 FINDINGS: Brain: No evidence of acute infarction,  hemorrhage, hydrocephalus, extra-axial collection or mass lesion/mass effect. On a few slices the variable density of the pons is attributed to streak artifact based on reformats. Age normal brain volume. Vascular: Atherosclerotic calcification Skull: Negative Sinuses/Orbits: Bilateral cataract resection. IMPRESSION: Unremarkable head CT for age. Electronically Signed   By: Monte Fantasia M.D.   On: 02/07/2018 15:20    Assessment & Plan    1. Acute on chronic diastolic CHF: RHC as above with elevated R>L heart filling pressures suggesting significant RV failure.  Echo with EF 50-55%, severe LVH, D-shaped interventricular septum.  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  On exam, he remains very volume overloaded.  Diuresis is complicated by CKD stage IV, and UOP was sluggish yesterday with a rise in creatinine to 3.24.   - Continue Lasix 120 mg IV bid and will give a dose of metolazone 5 mg po x 1 this morning to try to facilitate diuresis.  - Needs workup for cardiac amyloidosis.  Will arrange for PYP scan and myeloma panel.  No cardiac MRI as we cannot give contrast with renal failure.  2. CKD stage IV: Creatinine higher today at 3.24.  Suspect cardiorenal syndrome.  Unfortunately, he remains markedly volume overloaded with RV failure.  Will continue to try to facilitate diuresis today with addition of metolazone as above.  Nephrology following.  3. Atrial fibrillation: Permanent.  Continue warfarin.  4. Elevated troponin: Mild troponin elevation is likely demand ischemia, doubt ACS.   For questions or updates, please contact [place] Please consult [email or web address] for contact info under     Signed, Loralie Champagne, MD  02/09/2018, 9:44 AM

## 2018-02-09 NOTE — Consult Note (Signed)
Consultation Note Date: 02/09/2018   Patient Name: Omar Lambert  DOB: [birth year]  MRN: [id number]  Age / Sex: 78 y.o., male  PCP: Gayland Curry, DO Referring Physician: Hosie Poisson, MD  Reason for Consultation: Establishing goals of care and Psychosocial/spiritual support  HPI/Patient Profile: 78 y.o. male  with past medical history of diastolic heart failure, diabetes type 2, hypothyroidism, COPD, chronic kidney disease stage III, atrial fib, admitted on 01/31/2018 with shortness of breath and worsening edema.  He was started on Lasix and BiPAP support.  Lactic acid on admission was 3.4.  Patient's cardiac work-up now shows significant RV failure as well as severe LVH.  His creatinine has been worsening.  On 02/09/2018, creatinine is 3.24.  He has had good urine output but that appears to be slowing.  Patient has been ventilated in the past as well as having to have emergent hemodialysis.  Per review of cardiology notes, patient is being worked up for cardiac amyloidosis.  Consult ordered for goals of care.   Clinical Assessment and Goals of Care: Patient seen, chart reviewed.  Patient is very lethargic and does not participate in assessment.  His wife, Omar Lambert, is at the bedside.  She shares with me that they have been together for 59 years and have 3 children.  She states her husband told her a couple of weeks ago that he is not going to be with her much longer.  She herself has seen a significant decline particularly that he has had altered mental status at home which includes visual hallucinations, mild paranoia, loss of appetite, increased sleep  Introduced palliative medicine services as well as an additional resource and source of support.  We did engage in pathophysiology regarding chronic kidney disease, worsening heart failure.  I also explained terms of full code versus DNR.  Spouse states at  this point, she would pursue CPR, defibrillation and short-term ventilation.  Were he unable to wean on his own she would liberate him from the vent and not pursue a trach or permanent feeding tube.  [name] at this point is not able to participate in goals of care discussion.  His healthcare proxy would be his wife, Omar Lambert at [phone]  They will be making decisions together as a family    SUMMARY OF RECOMMENDATIONS   Continue full code for now Palliative medicine to stay involved in follow-up after further work-up for cardiac amyloidosis Hard Choices for [employer] People booklet given to spouse as well as MOST form, to utilize as a template for discussing goals of care with the children Patient may qualify for his hospice Medicare benefit in the home should his condition continue to decline, creatinine not improve, with new potential comorbidity of cardiac amyloidosis Should this continue out into the community, goals of care that is, would recommend utilizing community-based services through either care connection a division of hospice of the [place] or hospice and palliative care of [facility]'s palliative medicine division.  Please contact our office for more information and  direct numbers  Code Status/Advance Care Planning:  Full code    Symptom Management:   As per cardiology and nephrology  Palliative Prophylaxis:   Aspiration, Bowel Regimen, Delirium Protocol, Eye Care, Frequent Pain Assessment, Oral Care and Turn Reposition  Additional Recommendations (Limitations, Scope, Preferences):  No Tracheostomy  Psycho-social/Spiritual:   Desire for further Chaplaincy support:yes  Additional Recommendations: Referral to Community Resources   Prognosis:   Unable to determine  Discharge Planning: To Be Determined      Primary Diagnoses: Present on Admission: . Acute on chronic diastolic (congestive) heart failure ([place]) . Benign essential hypertension .  Esophageal reflux . Chronic kidney disease (CKD) stage G3b/A2, moderately decreased glomerular filtration rate (GFR) between 30-44 mL/min/1.73 square meter and albuminuria creatinine ratio between 30-299 mg/g (HCC) . Persistent atrial fibrillation . Hypothyroidism . Diabetes mellitus with renal complications ([place])   I have reviewed the medical record, interviewed the patient and family, and examined the patient. The following aspects are pertinent.  Past Medical History:  Diagnosis Date  . Acute kidney injury superimposed on chronic kidney disease ([place]) 11/2016  . Acute respiratory failure ([place]) 11/2016  . Allergy   . Asthma   . Atrial fibrillation ([place])   . 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 Benign essential hypertension   . CHF (congestive heart failure) ([place])   . CKD (chronic kidney disease), stage III ([place])   . COPD (chronic obstructive pulmonary disease) ([place])   . Diabetes ([place])   . Diabetes mellitus without complication ([place])   . Dyspnea   . Gastritis   . GERD (gastroesophageal reflux disease)   . Gout attack 09/2012  . Hypertension   . Sinusitis, maxillary, chronic   . Thyroid disease   . Type II or unspecified type diabetes mellitus without mention of complication, not stated as uncontrolled    Social History   Socioeconomic History  . Marital status: Married    Spouse name: Not on file  . Number of children: 3  . Years of education: Not on file  . Highest education level: Not on file  Occupational History  . Occupation: Retired  Scientific laboratory technician  . Financial resource strain: Not hard at all  . Food insecurity:    Worry: Never true    Inability: Never true  . Transportation needs:    Medical: No    Non-medical: No  Tobacco Use  . Smoking status: Former Smoker    Packs/day: 1.00    Years: 20.00    Pack years: 20.00    Types: Cigarettes    Last attempt to quit: 02/05/1997    Years since quitting: 21.0  . Smokeless tobacco: Never Used    Substance and Sexual Activity  . Alcohol use: No  . Drug use: No  . Sexual activity: Not Currently  Lifestyle  . Physical activity:    Days per week: 0 days    Minutes per session: 0 min  . Stress: Not at all  Relationships  . Social connections:    Talks on phone: Once a week    Gets together: Twice a week    Attends religious service: More than 4 times per year    Active member of club or organization: No    Attends meetings of clubs or organizations: Never    Relationship status: Married  Other Topics Concern  . Not on file  Social History Narrative   ** Merged History Encounter **       Lives  in 1 story apt with his wife, Vivaan Helseth.  Sometimes exercises.  Drinks coffee.     Family History  Problem Relation Age of Onset  . Stroke Mother   . Diabetes Mother   . Cancer Sister   . Diabetes Sister   . COPD Sister   . COPD Sister   . Diabetes Father    Scheduled Meds: . allopurinol  300 mg Oral Daily  . bisoprolol  5 mg Oral Daily  . calcitRIOL  0.25 mcg Oral QODAY  . calcitRIOL  0.5 mcg Oral QODAY  . Influenza vac split quadrivalent PF  0.5 mL Intramuscular Tomorrow-1000  . isosorbide mononitrate  30 mg Oral Daily  . levothyroxine  175 mcg Oral QAC breakfast  . mouth rinse  15 mL Mouth Rinse BID  . metolazone  5 mg Oral Once  . multivitamin with minerals  1 tablet Oral Daily  . pantoprazole  40 mg Oral Daily  . polyethylene glycol  17 g Oral Daily  . potassium chloride SA  20 mEq Oral Daily  . rosuvastatin  20 mg Oral Daily  . senna-docusate  1 tablet Oral BID  . sodium chloride flush  3 mL Intravenous Q12H  . umeclidinium-vilanterol  1 puff Inhalation Daily  . Warfarin - Pharmacist Dosing Inpatient   Does not apply q1800   Continuous Infusions: . sodium chloride Stopped (02/04/18 0004)  . furosemide 120 mg (02/09/18 0919)   PRN Meds:.sodium chloride, acetaminophen, albuterol, fluticasone, ondansetron (ZOFRAN) IV, sodium chloride flush Medications  Prior to Admission:  Prior to Admission medications   Medication Sig Start Date End Date Taking? Authorizing Provider  acetaminophen (TYLENOL) 325 MG tablet Take 650 mg by mouth every 6 (six) hours as needed for mild pain.   Yes [provider]  albuterol (PROVENTIL) (2.5 MG/3ML) 0.083% nebulizer solution INHALE CONTENTS OF 1 VIAL IN NEBULIZER EVERY 4 HOURS AS NEEDED FOR WHEEZING AND ASTHMA Patient taking differently: Take 2.5 mg by nebulization every 4 (four) hours as needed for wheezing (and asthma).  07/18/17  Yes [name], [name] L, DO  allopurinol (ZYLOPRIM) 300 MG tablet TAKE 1 TABLET BY MOUTH EVERY DAY Patient taking differently: Take 300 mg by mouth daily.  11/04/17  Yes [name], [name] L, DO  calcitRIOL (ROCALTROL) 0.25 MCG capsule Take 0.25-0.5 mcg by mouth See admin instructions. Take 0.25 mcg by mouth every other day, alternating with 0.5 mcg   Yes [provider]  fluticasone (FLONASE) 50 MCG/ACT nasal spray SPRAY 2 SPRAYS INTO EACH NOSTRIL EVERY DAY Patient taking differently: Place 2 sprays into both nostrils daily as needed for allergies or rhinitis.  12/09/17  Yes [name], [name] L, DO  KLOR-CON M20 20 MEQ tablet TAKE 1 TABLET BY MOUTH EVERY DAY Patient taking differently: Take 20 mEq by mouth daily.  04/09/16  Yes [name], [name] L, DO  levothyroxine (SYNTHROID, LEVOTHROID) 175 MCG tablet TAKE 1 TABLET EVERY DAY BEFORE BREAKFAST Patient taking differently: Take 175 mcg by mouth daily before breakfast.  11/12/17  Yes [name], [name] L, DO  Multiple Vitamins-Minerals (CENTRUM SILVER ADULT 50+ PO) Take 1 tablet by mouth daily.   Yes [provider]  OXYGEN Inhale 2 L into the lungs continuous.   Yes [provider]  pantoprazole (PROTONIX) 40 MG tablet Take 1 tablet (40 mg total) by mouth daily. 12/13/17  Yes Lauree Chandler, NP  rosuvastatin (CRESTOR) 20 MG tablet Take 1 tablet (20 mg total) by mouth daily. 11/12/17  Yes Troy Sine, MD  torsemide (DEMADEX)  20 MG tablet Take 60 mg by mouth See admin instructions. Take 60 mg by mouth 2 times a day- morning and noon   Yes [provider]  UNABLE TO FIND CPAP- At bedtime and during all naps   Yes [provider]  warfarin (COUMADIN) 5 MG tablet TAKE 1/2 TO 1 TABLET BY MOUTH DAILY AS DIRECTED BY COUMADIN CLINIC Patient taking differently: Take 2.5-5 mg by mouth See admin instructions. Take 2.5 mg by mouth in the morning on Sun/Tues/Wed/Thurs/Sat and 5 mg on Mon/Fri 12/03/17  Yes Troy Sine, MD  isosorbide mononitrate (IMDUR) 30 MG 24 hr tablet Take 1 tablet (30 mg total) by mouth daily. 11/12/17   Troy Sine, MD  umeclidinium-vilanterol (ANORO ELLIPTA) 62.5-25 MCG/INH AEPB Inhale 1 puff into the lungs daily. Patient not taking: Reported on 01/31/2018 12/30/17   Tanda Rockers, MD   No Known Allergies Review of Systems  Unable to perform ROS: Mental status change    Physical Exam Vitals signs and nursing note reviewed.  Constitutional:      Comments: Patient is lethargic, does not awaken for assessment He is an elderly man who appears ill.  Wife is at the bedside  HENT:     Head: Normocephalic and atraumatic.     Mouth/Throat:     Mouth: Mucous membranes are dry.  Pulmonary:     Comments: Poor respiratory effort; no secretions noted Patient wears CPAP at home and has been on BiPAP in the hospital Abdominal:     General: There is distension.  Musculoskeletal:        General: Swelling present.  Skin:    General: Skin is warm and dry.  Neurological:     Comments: Unable to test  Psychiatric:     Comments: No overt agitation otherwise unable to test Per spouse, patient has been hallucinating, believes that he is at home     Vital Signs: BP 105/64 (BP Location: Left Arm)   Pulse 61   Temp 98 F (36.7 C) (Oral)   Resp (!) 23   Ht 5\' 9"  (1.753 m)   Wt 98.2 kg   SpO2 92%   BMI 31.97 kg/m  Pain Scale: 0-10 POSS *See Group Information*: 1-Acceptable,Awake  and alert Pain Score: 0-No pain   SpO2: SpO2: 92 % O2 Device:SpO2: 92 % O2 Flow Rate: .O2 Flow Rate (L/min): 2 L/min  IO: Intake/output summary:   Intake/Output Summary (Last 24 hours) at 02/09/2018 1039 Last data filed at 02/09/2018 0919 Gross per 24 hour  Intake 844 ml  Output 970 ml  Net -126 ml    LBM: Last BM Date: 02/06/18 Baseline Weight: Weight: 107.5 kg Most recent weight: Weight: 98.2 kg     Palliative Assessment/Data:   Flowsheet Rows     Most Recent Value  Intake Tab  Referral Department  Hospitalist  Unit at Time of Referral  Intermediate Care Unit  Palliative Care Primary Diagnosis  Cardiac  Date Notified  02/07/18  Reason for referral  Clarify Goals of Care, Psychosocial or Spiritual support  Date of Admission  01/31/18  Date first seen by Palliative Care  02/09/18  # of days Palliative referral response time  2 Day(s)  # of days IP prior to Palliative referral  7  Clinical Assessment  Palliative Performance Scale Score  30%  Pain Max last 24 hours  Not able to report  Pain Min Last 24 hours  Not able to report  Dyspnea Max  Last 24 Hours  Not able to report  Dyspnea Min Last 24 hours  Not able to report  Nausea Max Last 24 Hours  Not able to report  Nausea Min Last 24 Hours  Not able to report  Anxiety Max Last 24 Hours  Not able to report  Anxiety Min Last 24 Hours  Not able to report  Other Max Last 24 Hours  Not able to report  Psychosocial & Spiritual Assessment  Palliative Care Outcomes      Time In: 1000 Time Out: 1050 Time Total: 50 min Greater than 50%  of this time was spent counseling and coordinating care related to the above assessment and plan.  Signed by: Dory Horn, NP   Please contact Palliative Medicine Team phone at [phone] for questions and concerns.  For individual provider: See Shea Evans

## 2018-02-09 NOTE — Progress Notes (Signed)
ANTICOAGULATION CONSULT NOTE - Follow up [place] for coumadin Indication: atrial fibrillation  No Known Allergies  Patient Measurements: Height: 5\' 9"  (175.3 cm) Weight: 216 lb 7.9 oz (98.2 kg) IBW/kg (Calculated) : 70.7  Vital Signs: Temp: 98 F (36.7 C) (01/05 1132) Temp Source: Oral (01/05 1132) BP: 108/72 (01/05 1132) Pulse Rate: 62 (01/05 1132)  Labs: Recent Labs    02/06/18 1620  02/07/18 0351 02/08/18 0225 02/08/18 0811 02/09/18 0541  HGB  --    < > 11.0* 11.7*  --  11.3*  HCT  --   --  36.9* 38.9*  --  39.9  PLT  --   --  153 122*  --  144*  LABPROT  --   --  21.7* 19.9*  --  19.7*  INR  --   --  1.92 1.72  --  1.69  HEPARINUNFRC 0.28*  --  0.53  --   --   --   CREATININE  --   --  2.90*  --  2.94* 3.24*   < > = values in this interval not displayed.    Estimated Creatinine Clearance: 21.7 mL/min (A) (by C-G formula based on SCr of 3.24 mg/dL (H)).   Medical History: Past Medical History:  Diagnosis Date  . Acute kidney injury superimposed on chronic kidney disease ([place]) 11/2016  . Acute respiratory failure ([place]) 11/2016  . Allergy   . Asthma   . Atrial fibrillation ([place])   . 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 Benign essential hypertension   . CHF (congestive heart failure) ([place])   . CKD (chronic kidney disease), stage III ([place])   . COPD (chronic obstructive pulmonary disease) ([place])   . Diabetes ([place])   . Diabetes mellitus without complication ([place])   . Dyspnea   . Gastritis   . GERD (gastroesophageal reflux disease)   . Gout attack 09/2012  . Hypertension   . Sinusitis, maxillary, chronic   . Thyroid disease   . Type II or unspecified type diabetes mellitus without mention of complication, not stated as uncontrolled     Assessment: 66 yoM admitted with SOB and swelling. Pt on warfarin at home for AFib, and pharmacy restart -INR= 1.69 today  Home dose: 2.5mg  daily except for 5mg  on Mon/Fri  Goal of  Therapy:  INR 2-3 Monitor platelets by anticoagulation protocol: Yes   Plan:  -Coumadin 7.5 mg today.  This is much higher than home dose, but INR falling after 2 doses of 5 mg.   -Daily PT/INR  [place] Crane, Roylene Reason, [facility] Clinical Pharmacist Phone [phone]  02/09/2018 12:30 PM

## 2018-02-09 NOTE — Progress Notes (Signed)
PROGRESS NOTE    Omar Lambert  MOQ:[id number] DOB: 1940/04/02 DOA: 01/31/2018 PCP: Omar Curry, DO    Brief Narrative: Omar Lambert is a 78 y.o. male with medical history significant of DM2, Hypothyroidism, HTN, Gout, GERD, COPD, CKD, diastolic CHF, asthma who presents for SOB and swelling. Pt admitted for acute on chronic diastolic heart failure.    Assessment & Plan:   Active Problems:   Benign essential hypertension   COPD exacerbation (HCC)   Esophageal reflux   Chronic kidney disease (CKD) stage G3b/A2, moderately decreased glomerular filtration rate (GFR) between 30-44 mL/min/1.73 square meter and albuminuria creatinine ratio between 30-299 mg/g (HCC)   Persistent atrial fibrillation   Hypothyroidism   Diabetes mellitus with renal complications (HCC)   Shortness of breath   Goals of care, counseling/discussion   Acute on chronic congestive heart failure (HCC)   Acute on chronic diastolic (congestive) heart failure ([place])   Palliative care by specialist   Acute on chronic diastolic heart failure Admitted to stepdown .  Probably secondary to noncompliance to fluid and salt restriction. Patient continues to be fluid overloaded requiring IV Lasix 120 mg every 8 hours, held this morning for worsening renal parameters. Continue with strict intake and output and daily weights and monitor renal parameters while on IV Lasix.    Repeat echocardiogram showed Wall thickness wasincreased in a pattern of severe LVH. Systolic function was normal. The estimated ejection fraction was in the range of 50%  to 55%. Wall motion was normal; there were no regional wall  motion abnormalities. The study was not technically sufficient to allow evaluation of LV diastolic dysfunction due to atrial fibrillation. Cardiology consulted. Underwent RHC, showing elevated right filling pressures, with sig RV failure.  Restarted lasix 120 IV BID.  Use BiPAP as needed and nasal cannula oxygen to keep sats  greater than 90%. He has diuresed about 11 lit of since admission .  Not much diuresis overnight. Slight worsening of creatinine from 2.9-3.4.  Nephrology on board    Elevated troponins probably from demand ischemia from acute CHF.    Essential hypertension Well-controlled Continue home medication    Mild acute on Stage III chronic kidney disease Watch renal parameters while on IV diuretics. Creatinine at 2.9 and stable.  Nephrology consulted for recommendations.   Persistent atrial fibrillation Rate controlled IV heparin for cardiac cath.    Type 2 diabetes mellitus with renal complications  CBG (last 3)  Recent Labs    02/07/18 0747  GLUCAP 88   well controlled, continue with sliding scale insulin. No changes in medications.     Hyperlipidemia Continue with Crestor  Patient slipped from the chair :  Patient denies any new complaints but he appears confused and lethargic. CT head without contrast does not show any acute abnormality ABG shows hypercapnia would recommend compliance to CPAP every day.   Acute metabolic encephalopathy probably from hypercapnia and acute kidney injury from diuresis Continue to monitor.  Due to poor prognosis and multiple comorbidities and poor functional status palliative care consulted for goals of care.  Discussions ongoing   DVT prophylaxis: Heparin Code Status: full code.  Family Communication: Wife at bedside Disposition Plan:pending clinical improvement and cardiology recommendations.   Consultants:  Cardiology. Nephrology Dr. Johnney Ou Palliative care   Procedures: Echocardiogram.      Antimicrobials: none.    Subjective: Patient continues to be confused.  He does not appear to be in any acute distress at this time Objective: Vitals:  02/08/18 2202 02/09/18 0448 02/09/18 0735 02/09/18 1132  BP:  109/77 105/64 108/72  Pulse: 65 70 61 62  Resp: (!) 25 (!) 25 (!) 23 16  Temp:  98 F (36.7 C) 98 F (36.7 C)  98 F (36.7 C)  TempSrc:  Oral Oral Oral  SpO2: 99% 98% 92% 94%  Weight:  98.2 kg    Height:        Intake/Output Summary (Last 24 hours) at 02/09/2018 1306 Last data filed at 02/09/2018 1100 Gross per 24 hour  Intake 542 ml  Output 1220 ml  Net -678 ml   Filed Weights   02/07/18 0547 02/08/18 0500 02/09/18 0448  Weight: 103.2 kg 97.7 kg 98.2 kg    Examination:  General exam: Lethargic, not in any kind of distress, on 2 to 3 lit  Respiratory system: Diminished air entry at bases, no wheezing or rhonchi Cardiovascular system: S1 & S2 heard, irregular, JVD present.  Gastrointestinal system: Abdomen is  Soft, nontender, nondistended with good bowel sounds Central nervous system: Lethargic, responds with yes or no to questions Extremities:able to move all extremities.  2+ pedal edema present.  Skin: No rashes, lesions or ulcers Psychiatry:  Mood & affect appropriate.     Data Reviewed: I have personally reviewed following labs and imaging studies  CBC: Recent Labs  Lab 02/03/18 0222 02/04/18 0329 02/07/18 0351 02/08/18 0225 02/09/18 0541  WBC 5.2 4.8 9.9 12.5* 8.3  NEUTROABS  --   --   --   --  7.0  HGB 10.5* 10.9* 11.0* 11.7* 11.3*  HCT 35.2* 37.1* 36.9* 38.9* 39.9  MCV 108.3* 109.4* 109.5* 108.4* 110.2*  PLT 158 158 153 122* 381*   Basic Metabolic Panel: Recent Labs  Lab 02/05/18 0243 02/06/18 0254 02/07/18 0351 02/08/18 0811 02/09/18 0541  NA 142 142 141 142 142  K 4.1 4.6 4.0 4.1 4.0  CL 92* 93* 87* 89* 88*  CO2 40* 39* 42* 41* 40*  GLUCOSE 91 99 103* 113* 100*  BUN 65* 75* 72* 77* 78*  CREATININE 2.46* 2.78* 2.90* 2.94* 3.24*  CALCIUM 9.6 9.6 9.8 9.7 9.7  MG 2.6*  --   --   --   --    GFR: Estimated Creatinine Clearance: 21.7 mL/min (A) (by C-G formula based on SCr of 3.24 mg/dL (H)). Liver Function Tests: No results for input(s): AST, ALT, ALKPHOS, BILITOT, PROT, ALBUMIN in the last 168 hours. No results for input(s): LIPASE, AMYLASE in the last  168 hours. No results for input(s): AMMONIA in the last 168 hours. Coagulation Profile: Recent Labs  Lab 02/05/18 0243 02/06/18 0254 02/07/18 0351 02/08/18 0225 02/09/18 0541  INR 2.16 1.98 1.92 1.72 1.69   Cardiac Enzymes: No results for input(s): CKTOTAL, CKMB, CKMBINDEX, TROPONINI in the last 168 hours. BNP (last 3 results) No results for input(s): PROBNP in the last 8760 hours. HbA1C: No results for input(s): HGBA1C in the last 72 hours. CBG: Recent Labs  Lab 02/05/18 0838 02/05/18 1137 02/05/18 1633 02/05/18 2127 02/07/18 0747  GLUCAP 84 92 114* 97 88   Lipid Profile: No results for input(s): CHOL, HDL, LDLCALC, TRIG, CHOLHDL, LDLDIRECT in the last 72 hours. Thyroid Function Tests: No results for input(s): TSH, T4TOTAL, FREET4, T3FREE, THYROIDAB in the last 72 hours. Anemia Panel: Recent Labs    02/07/18 0351  VITAMINB12 386  FOLATE 32.1   Sepsis Labs: No results for input(s): PROCALCITON, LATICACIDVEN in the last 168 hours.  Recent Results (from the past 240  hour(s))  Blood culture (routine x 2)     Status: None   Collection Time: 01/31/18 12:28 PM  Result Value Ref Range Status   Specimen Description BLOOD RIGHT ANTECUBITAL  Final   Special Requests   Final    BOTTLES DRAWN AEROBIC AND ANAEROBIC Blood Culture adequate volume   Culture   Final    NO GROWTH 5 DAYS Performed at [facility], [address] [place], [place] [address]    Report Status 02/05/2018 FINAL  Final  Blood culture (routine x 2)     Status: None   Collection Time: 01/31/18  1:41 PM  Result Value Ref Range Status   Specimen Description BLOOD LEFT ANTECUBITAL  Final   Special Requests   Final    BOTTLES DRAWN AEROBIC AND ANAEROBIC Blood Culture adequate volume   Culture   Final    NO GROWTH 5 DAYS Performed at [facility], [place] [address], [place], [place] [address]    Report Status 02/05/2018 FINAL  Final  MRSA PCR Screening     Status: None   Collection Time:  01/31/18  9:38 PM  Result Value Ref Range Status   MRSA by PCR NEGATIVE NEGATIVE Final    Comment:        The GeneXpert MRSA Assay (FDA approved for NASAL specimens only), is one component of a comprehensive MRSA colonization surveillance program. It is not intended to diagnose MRSA infection nor to guide or monitor treatment for MRSA infections. Performed at [facility], [place] [address] [place],  [address]          Radiology Studies: Ct Head Wo Contrast  Result Date: 02/07/2018 CLINICAL DATA:  Unexplained altered level of consciousness EXAM: CT HEAD WITHOUT CONTRAST TECHNIQUE: Contiguous axial images were obtained from the base of the skull through the vertex without intravenous contrast. COMPARISON:  12/02/2017 FINDINGS: Brain: No evidence of acute infarction, hemorrhage, hydrocephalus, extra-axial collection or mass lesion/mass effect. On a few slices the variable density of the pons is attributed to streak artifact based on reformats. Age normal brain volume. Vascular: Atherosclerotic calcification Skull: Negative Sinuses/Orbits: Bilateral cataract resection. IMPRESSION: Unremarkable head CT for age. Electronically Signed   By: Monte Fantasia M.D.   On: 02/07/2018 15:20        Scheduled Meds: . allopurinol  300 mg Oral Daily  . bisoprolol  5 mg Oral Daily  . calcitRIOL  0.25 mcg Oral QODAY  . calcitRIOL  0.5 mcg Oral QODAY  . Influenza vac split quadrivalent PF  0.5 mL Intramuscular Tomorrow-1000  . isosorbide mononitrate  30 mg Oral Daily  . levothyroxine  175 mcg Oral QAC breakfast  . mouth rinse  15 mL Mouth Rinse BID  . multivitamin with minerals  1 tablet Oral Daily  . pantoprazole  40 mg Oral Daily  . polyethylene glycol  17 g Oral Daily  . potassium chloride SA  20 mEq Oral Daily  . rosuvastatin  20 mg Oral Daily  . senna-docusate  1 tablet Oral BID  . sodium chloride flush  3 mL Intravenous Q12H  . umeclidinium-vilanterol  1 puff Inhalation  Daily  . warfarin  7.5 mg Oral ONCE-1800  . Warfarin - Pharmacist Dosing Inpatient   Does not apply q1800   Continuous Infusions: . sodium chloride Stopped (02/04/18 0004)  . furosemide 120 mg (02/09/18 0919)     LOS: 9 days    Time spent 30 minutes.     Netta Fodge  Karleen Hampshire, MD [facility] Pager [phone]  If 7PM-7AM, please contact night-coverage [email or web address] Password [email or web address] 02/09/2018, 1:06 PM

## 2018-02-09 NOTE — Progress Notes (Signed)
[facility]    NEPHROLOGY PROGRESS NOTE  SUBJECTIVE: No acute complaints today.  Awake, denies sob and cp.  Confused to time, place, and events.  Eating lunch during exam.  Urine output has slowed.  Difficult to obtain review of systems.     OBJECTIVE:  Vitals:   02/09/18 0735 02/09/18 1132  BP: 105/64 108/72  Pulse: 61 62  Resp: (!) 23 16  Temp: 98 F (36.7 C) 98 F (36.7 C)  SpO2: 92% 94%   I/O last 3 completed shifts: In: 1084 [P.O.:960; IV Piggyback:124] Out: 8115 [Urine:1745]   Gen[place]l:  Alert, confused, NAD HEENT: MMM [place] AT anicteric sclera Neck: Positive JVD, no adenopathy CV:  Heart RRR  Lungs:  L/S with bibasilar rales Abd:  abd SNT/ND with normal BS GU:  Bladder non-palpable Extremities:  (+)1 bilateral LE edema. Skin:  No skin rash  MEDICATIONS:   Current Facility-Administered Medications:  .  0.9 %  sodium chloride infusion, 250 mL, Intravenous, PRN, Jettie Booze, MD, Stopped at 02/04/18 0004 .  acetaminophen (TYLENOL) tablet 650 mg, 650 mg, Oral, Q6H PRN, [name] S, MD .  albuterol (PROVENTIL) (2.5 MG/3ML) 0.083% nebulizer solution 2.5 mg, 2.5 mg, Nebulization, Q2H PRN, [name] S, MD .  allopurinol (ZYLOPRIM) tablet 300 mg, 300 mg, Oral, Daily, [name] S, MD, 300 mg at 02/09/18 0915 .  bisoprolol (ZEBETA) tablet 5 mg, 5 mg, Oral, Daily, Jettie Booze, MD, 5 mg at 02/09/18 0915 .  calcitRIOL (ROCALTROL) capsule 0.25 mcg, 0.25 mcg, Oral, QODAY, Jettie Booze, MD, 0.25 mcg at 02/08/18 1007 .  calcitRIOL (ROCALTROL) capsule 0.5 mcg, 0.5 mcg, Oral, QODAY, Jettie Booze, MD, 0.5 mcg at 02/09/18 0915 .  fluticasone (FLONASE) 50 MCG/ACT nasal spray 2 spray, 2 spray, Each Nare, Daily PRN, Jettie Booze, MD .  furosemide (LASIX) 120 mg in dextrose 5 % 50 mL IVPB, 120 mg, Intravenous, BID, Larey Dresser, MD, Last Rate: 62 mL/hr at 02/09/18 0919, 120 mg at 02/09/18 0919 .   Influenza vac split quadrivalent PF (FLUZONE HIGH-DOSE) injection 0.5 mL, 0.5 mL, Intramuscular, Tomorrow-1000, Varanasi, Jayadeep S, MD .  isosorbide mononitrate (IMDUR) 24 hr tablet 30 mg, 30 mg, Oral, Daily, Jettie Booze, MD, 30 mg at 02/09/18 0915 .  levothyroxine (SYNTHROID, LEVOTHROID) tablet 175 mcg, 175 mcg, Oral, QAC breakfast, Jettie Booze, MD, 175 mcg at 02/09/18 7262 .  MEDLINE mouth rinse, 15 mL, Mouth Rinse, BID, Jettie Booze, MD, 15 mL at 02/09/18 0924 .  multivitamin with minerals tablet 1 tablet, 1 tablet, Oral, Daily, Jettie Booze, MD, 1 tablet at 02/09/18 0355 .  ondansetron (ZOFRAN) injection 4 mg, 4 mg, Intravenous, Q6H PRN, [name] S, MD .  pantoprazole (PROTONIX) EC tablet 40 mg, 40 mg, Oral, Daily, Jettie Booze, MD, 40 mg at 02/09/18 0915 .  polyethylene glycol (MIRALAX / GLYCOLAX) packet 17 g, 17 g, Oral, Daily, Jettie Booze, MD, 17 g at 02/09/18 [phone] .  potassium chloride SA (K-DUR,KLOR-CON) CR tablet 20 mEq, 20 mEq, Oral, Daily, Jettie Booze, MD, 20 mEq at 02/09/18 0915 .  rosuvastatin (CRESTOR) tablet 20 mg, 20 mg, Oral, Daily, Jettie Booze, MD, 20 mg at 02/09/18 0915 .  senna-docusate (Senokot-S) tablet 1 tablet, 1 tablet, Oral, BID, Jettie Booze, MD, 1 tablet at 02/09/18 [phone] .  sodium chloride flush (NS) 0.9 % injection 3 mL, 3 mL, Intravenous, Q12H, Jettie Booze, MD, 3 mL at 02/08/18  2100 .  sodium chloride flush (NS) 0.9 % injection 3 mL, 3 mL, Intravenous, PRN, Jettie Booze, MD .  umeclidinium-vilanterol (ANORO ELLIPTA) 62.5-25 MCG/INH 1 puff, 1 puff, Inhalation, Daily, Jettie Booze, MD, 1 puff at 02/08/18 [phone] .  warfarin (COUMADIN) tablet 7.5 mg, 7.5 mg, Oral, ONCE-1800, Carney, Gay Filler, RPH .  Warfarin - Pharmacist Dosing Inpatient, , Does not apply, q1800, Kris Mouton, [facility]     LABS:  CBC Latest Ref Rng & Units 02/09/2018 02/08/2018 02/07/2018  WBC 4.0  - 10.5 K/uL 8.3 12.5(H) 9.9  Hemoglobin 13.0 - 17.0 g/dL 11.3(L) 11.7(L) 11.0(L)  Hematocrit 39.0 - 52.0 % 39.9 38.9(L) 36.9(L)  Platelets 150 - 400 K/uL 144(L) 122(L) 153    CMP Latest Ref Rng & Units 02/09/2018 02/08/2018 02/07/2018  Glucose 70 - 99 mg/dL 100(H) 113(H) 103(H)  BUN 8 - 23 mg/dL 78(H) 77(H) 72(H)  Creatinine 0.61 - 1.24 mg/dL 3.24(H) 2.94(H) 2.90(H)  Sodium 135 - 145 mmol/L 142 142 141  Potassium 3.5 - 5.1 mmol/L 4.0 4.1 4.0  Chloride 98 - 111 mmol/L 88(L) 89(L) 87(L)  CO2 22 - 32 mmol/L 40(H) 41(H) 42(H)  Calcium 8.9 - 10.3 mg/dL 9.7 9.7 9.8  Total Protein 6.5 - 8.1 g/dL - - -  Total Bilirubin 0.3 - 1.2 mg/dL - - -  Alkaline Phos 38 - 126 U/L - - -  AST 15 - 41 U/L - - -  ALT 0 - 44 U/L - - -    Lab Results  Component Value Date   PTH 92 (H) 11/05/2016   CALCIUM 9.7 02/09/2018   PHOS 4.6 11/09/2016       Component Value Date/Time   COLORURINE YELLOW 10/27/2016 0113   APPEARANCEUR CLEAR 10/27/2016 0113   LABSPEC 1.013 10/27/2016 0113   PHURINE 6.0 10/27/2016 0113   GLUCOSEU NEGATIVE 10/27/2016 0113   HGBUR NEGATIVE 10/27/2016 0113   BILIRUBINUR NEGATIVE 10/27/2016 0113   KETONESUR NEGATIVE 10/27/2016 0113   PROTEINUR 100 (A) 10/27/2016 0113   UROBILINOGEN 0.2 06/17/2012 0622   NITRITE NEGATIVE 10/27/2016 0113   LEUKOCYTESUR NEGATIVE 10/27/2016 0113      Component Value Date/Time   PHART 7.433 02/07/2018 1916   PCO2ART 66.6 (HH) 02/07/2018 1916   PO2ART 70.6 (L) 02/07/2018 1916   HCO3 43.8 (H) 02/07/2018 1916   TCO2 >50 (H) 02/07/2018 1255   ACIDBASEDEF 6.0 (H) 10/28/2016 1042   O2SAT 94.0 02/07/2018 1916       Component Value Date/Time   IRON 33 (L) 11/05/2016 1225   TIBC 305 11/05/2016 1225   FERRITIN 62.5 11/18/2012 1459   IRONPCTSAT 11 (L) 11/05/2016 1225       ASSESSMENT/PLAN:     Problem List Items Addressed This Visit      Cardiovascular and Mediastinum   Acute on chronic congestive heart failure (HCC)   Relevant Medications    furosemide (LASIX) injection 80 mg (Completed)   isosorbide mononitrate (IMDUR) 24 hr tablet 30 mg   rosuvastatin (CRESTOR) tablet 20 mg   furosemide (LASIX) 120 mg in dextrose 5 % 50 mL IVPB (Completed)   warfarin (COUMADIN) tablet 4 mg (Completed)   warfarin (COUMADIN) tablet 4 mg (Completed)   warfarin (COUMADIN) tablet 5 mg (Completed)   bisoprolol (ZEBETA) tablet 5 mg   warfarin (COUMADIN) tablet 2.5 mg (Completed)   metolazone (ZAROXOLYN) tablet 2.5 mg (Completed)   aspirin chewable tablet 81 mg (Completed)   warfarin (COUMADIN) tablet 5 mg (Completed)   Warfarin - Pharmacist  Dosing Inpatient   furosemide (LASIX) 120 mg in dextrose 5 % 50 mL IVPB   warfarin (COUMADIN) tablet 5 mg (Completed)   metolazone (ZAROXOLYN) tablet 5 mg (Completed)   warfarin (COUMADIN) tablet 7.5 mg (Start on 02/09/2018  6:00 PM)     Respiratory   COPD exacerbation (HCC) - Primary   Relevant Medications   ipratropium-albuterol (DUONEB) 0.5-2.5 (3) MG/3ML nebulizer solution 3 mL (Completed)   methylPREDNISolone sodium succinate (SOLU-MEDROL) 125 mg/2 mL injection 125 mg (Completed)   azithromycin (ZITHROMAX) 500 mg in sodium chloride 0.9 % 250 mL IVPB (Completed)   fluticasone (FLONASE) 50 MCG/ACT nasal spray 2 spray   umeclidinium-vilanterol (ANORO ELLIPTA) 62.5-25 MCG/INH 1 puff   albuterol (PROVENTIL) (2.5 MG/3ML) 0.083% nebulizer solution 2.5 mg     Other   Shortness of breath   Relevant Orders   DG Chest 2 View (Completed)    Other Visit Diagnoses    Dyspnea       Relevant Orders   DG Chest 2 View (Completed)   DG Chest 2 View (Completed)      1.  Acute diastolic CHF.  Agree with diuretics.  Will monitor renal function.  Continues to be fluid overloaded although does clinically appear better compared to yesterday.  RHC (1/3):  Mean RA 22 PA 49/21 Mean PCWP 20 CI 2.4  Echo: EF 50-55%, severe LVH, d-shaped septum, RV mildly dilated, PASP 62 mmHg.  2.  CKD IV.  Appears to be around  baseline of 2.5.  Likely due to DM/HTN with (+)proteinuria.  Needs to follow up as outpatient. 3.  HTN.  BP control excellent.  Would avoid hypotension while trying to diurese 4.  DM.  Continue current Rx. 5.  AKI.  Mild in the setting of diuresis.   6. metabolic alkalosis.  Suspect he will not be responsive to loop diuretics in the setting.  Will add acetazolamide, which will hopefully help to manage his bicarb and CO2.     [place], DO, [place]

## 2018-02-10 ENCOUNTER — Inpatient Hospital Stay (HOSPITAL_COMMUNITY): Payer: Medicare HMO

## 2018-02-10 LAB — CBC WITH DIFFERENTIAL/PLATELET
Abs Immature Granulocytes: 0.02 10*3/uL (ref 0.00–0.07)
BASOS PCT: 0 %
Basophils Absolute: 0 10*3/uL (ref 0.0–0.1)
Eosinophils Absolute: 0.1 10*3/uL (ref 0.0–0.5)
Eosinophils Relative: 2 %
HCT: 36.4 % — ABNORMAL LOW (ref 39.0–52.0)
Hemoglobin: 10.6 g/dL — ABNORMAL LOW (ref 13.0–17.0)
Immature Granulocytes: 0 %
Lymphocytes Relative: 10 %
Lymphs Abs: 0.5 10*3/uL — ABNORMAL LOW (ref 0.7–4.0)
MCH: 31.8 pg (ref 26.0–34.0)
MCHC: 29.1 g/dL — ABNORMAL LOW (ref 30.0–36.0)
MCV: 109.3 fL — ABNORMAL HIGH (ref 80.0–100.0)
Monocytes Absolute: 0.7 10*3/uL (ref 0.1–1.0)
Monocytes Relative: 13 %
Neutro Abs: 3.9 10*3/uL (ref 1.7–7.7)
Neutrophils Relative %: 75 %
PLATELETS: 151 10*3/uL (ref 150–400)
RBC: 3.33 MIL/uL — ABNORMAL LOW (ref 4.22–5.81)
RDW: 17.2 % — AB (ref 11.5–15.5)
WBC: 5.3 10*3/uL (ref 4.0–10.5)
nRBC: 0 % (ref 0.0–0.2)

## 2018-02-10 LAB — BASIC METABOLIC PANEL
Anion gap: 7 (ref 5–15)
BUN: 81 mg/dL — ABNORMAL HIGH (ref 8–23)
CO2: 44 mmol/L — ABNORMAL HIGH (ref 22–32)
Calcium: 9.7 mg/dL (ref 8.9–10.3)
Chloride: 88 mmol/L — ABNORMAL LOW (ref 98–111)
Creatinine, Ser: 3.1 mg/dL — ABNORMAL HIGH (ref 0.61–1.24)
GFR calc Af Amer: 21 mL/min — ABNORMAL LOW (ref >60)
GFR calc non Af Amer: 18 mL/min — ABNORMAL LOW (ref >60)
Glucose, Bld: 93 mg/dL (ref 70–99)
Potassium: 3.7 mmol/L (ref 3.5–5.1)
Sodium: 139 mmol/L (ref 135–145)

## 2018-02-10 LAB — PROTIME-INR
INR: 1.8
Prothrombin Time: 20.6 seconds — ABNORMAL HIGH (ref 11.4–15.2)

## 2018-02-10 LAB — PHOSPHORUS: PHOSPHORUS: 3.9 mg/dL (ref 2.5–4.6)

## 2018-02-10 MED ORDER — WARFARIN SODIUM 7.5 MG PO TABS
7.5000 mg | ORAL_TABLET | Freq: Once | ORAL | Status: AC
  Administered 2018-02-10: 7.5 mg via ORAL
  Filled 2018-02-10: qty 1

## 2018-02-10 MED ORDER — TECHNETIUM TC 99M PYROPHOSPHATE
21.4000 | Freq: Once | INTRAVENOUS | Status: AC
  Administered 2018-02-10: 21.4 via INTRAVENOUS

## 2018-02-10 NOTE — Progress Notes (Signed)
PROGRESS NOTE    Omar Lambert  VOJ:[id number] DOB: 1940-05-16 DOA: 01/31/2018 PCP: Gayland Curry, DO    Brief Narrative: Omar Lambert is a 78 y.o. male with medical history significant of DM2, Hypothyroidism, HTN, Gout, GERD, COPD, CKD, diastolic CHF, asthma who presents for SOB and swelling. Pt admitted for acute on chronic diastolic heart failure.    Assessment & Plan:   Active Problems:   Benign essential hypertension   COPD exacerbation (HCC)   Esophageal reflux   Chronic kidney disease (CKD) stage G3b/A2, moderately decreased glomerular filtration rate (GFR) between 30-44 mL/min/1.73 square meter and albuminuria creatinine ratio between 30-299 mg/g (HCC)   Persistent atrial fibrillation   Hypothyroidism   Diabetes mellitus with renal complications (HCC)   Shortness of breath   Goals of care, counseling/discussion   Acute on chronic congestive heart failure (HCC)   Acute on chronic diastolic (congestive) heart failure ([place])   Palliative care by specialist   Acute on chronic diastolic heart failure Admitted to stepdown .  Probably secondary to noncompliance to fluid and salt restriction.He was initially started on IV lasix 120 mg TID , later decreased it to BID.  Continue with strict intake and output and daily weights and monitor renal parameters while on IV Lasix.    Repeat echocardiogram showed Wall thickness wasincreased in a pattern of severe LVH. Systolic function was normal. The estimated ejection fraction was in the range of 50%  to 55%. Wall motion was normal; there were no regional wall  motion abnormalities. The study was not technically sufficient to allow evaluation of LV diastolic dysfunction due to atrial fibrillation. Cardiology consulted. Underwent RHC, showing elevated right filling pressures, with sig RV failure.  Use BiPAP as needed and nasal cannula oxygen to keep sats greater than 90%. He has diuresed about 12 lit of since admission, lost about 20  lbs.  Suspicion for cardiac amyloidosis and a PYP scan ordered.  Myeloma panel is pending.  Appreciate cardiology recommendations.   Elevated troponins probably from demand ischemia from acute CHF.    Essential hypertension Well-controlled Continue home medication    Mild acute on Stage III chronic kidney disease Watch renal parameters while on IV diuretics. Creatinine increased to 3.4 post cath to 3.1 today.   Nephrology consulted for recommendations.   Persistent atrial fibrillation Rate controlled IV heparin for cardiac cath.    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     Hyperlipidemia Continue with Crestor  Patient slipped from the chair :  Patient denies any new complaints but he appears confused and lethargic. CT head without contrast does not show any acute abnormality ABG shows hypercapnia would recommend compliance to CPAP every day.   Acute metabolic encephalopathy probably from hypercapnia and acute kidney injury from diuresis Continue to monitor.  Due to poor prognosis and multiple comorbidities and poor functional status palliative care consulted for goals of care.  Discussions ongoing   DVT prophylaxis: Heparin Code Status: full code.  Family Communication: Wife at bedside Disposition Plan:pending clinical improvement and cardiology recommendations.   Consultants:  Cardiology. Nephrology Dr. Johnney Ou Palliative care   Procedures: Echocardiogram.      Antimicrobials: none.    Subjective: Not in distress.  Alert and comfortable.  Denies any chest pain or sob.  Family at bedside.   Objective: Vitals:   02/10/18 0316 02/10/18 0512 02/10/18 0719 02/10/18 0728  BP: 100/69   105/71  Pulse:    78  Resp:  20 18  Temp:    97.9 F (36.6 C)  TempSrc:    Oral  SpO2:   97% 92%  Weight:  98.9 kg    Height:        Intake/Output Summary (Last 24 hours) at 02/10/2018 1113 Last data filed at 02/10/2018 0900 Gross per  24 hour  Intake 440 ml  Output 1225 ml  Net -785 ml   Filed Weights   02/08/18 0500 02/09/18 0448 02/10/18 0512  Weight: 97.7 kg 98.2 kg 98.9 kg    Examination:  General exam: Lethargic, not in any kind of distress, on 2 to 3 lit  Respiratory system: air entry diminished at bases, no wheezing or rhonchi.  Cardiovascular system: S1 & S2 heard, irregular, JVD present.  Gastrointestinal system: Abdomen is  Soft, NT ND BS+ Central nervous system: answering questions appropriately Extremities:able to move all extremities. Pedal edema improving.  Skin: No rashes, lesions or ulcers Psychiatry:  Mood & affect appropriate.     Data Reviewed: I have personally reviewed following labs and imaging studies  CBC: Recent Labs  Lab 02/04/18 0329 02/07/18 0351 02/08/18 0225 02/09/18 0541 02/10/18 0234  WBC 4.8 9.9 12.5* 8.3 5.3  NEUTROABS  --   --   --  7.0 3.9  HGB 10.9* 11.0* 11.7* 11.3* 10.6*  HCT 37.1* 36.9* 38.9* 39.9 36.4*  MCV 109.4* 109.5* 108.4* 110.2* 109.3*  PLT 158 153 122* 144* 220   Basic Metabolic Panel: Recent Labs  Lab 02/05/18 0243 02/06/18 0254 02/07/18 0351 02/08/18 0811 02/09/18 0541 02/10/18 0234  NA 142 142 141 142 142 139  K 4.1 4.6 4.0 4.1 4.0 3.7  CL 92* 93* 87* 89* 88* 88*  CO2 40* 39* 42* 41* 40* 44*  GLUCOSE 91 99 103* 113* 100* 93  BUN 65* 75* 72* 77* 78* 81*  CREATININE 2.46* 2.78* 2.90* 2.94* 3.24* 3.10*  CALCIUM 9.6 9.6 9.8 9.7 9.7 9.7  MG 2.6*  --   --   --   --   --   PHOS  --   --   --   --   --  3.9   GFR: Estimated Creatinine Clearance: 22.8 mL/min (A) (by C-G formula based on SCr of 3.1 mg/dL (H)). Liver Function Tests: No results for input(s): AST, ALT, ALKPHOS, BILITOT, PROT, ALBUMIN in the last 168 hours. No results for input(s): LIPASE, AMYLASE in the last 168 hours. No results for input(s): AMMONIA in the last 168 hours. Coagulation Profile: Recent Labs  Lab 02/06/18 0254 02/07/18 0351 02/08/18 0225 02/09/18 0541  02/10/18 0234  INR 1.98 1.92 1.72 1.69 1.80   Cardiac Enzymes: No results for input(s): CKTOTAL, CKMB, CKMBINDEX, TROPONINI in the last 168 hours. BNP (last 3 results) No results for input(s): PROBNP in the last 8760 hours. HbA1C: No results for input(s): HGBA1C in the last 72 hours. CBG: Recent Labs  Lab 02/05/18 0838 02/05/18 1137 02/05/18 1633 02/05/18 2127 02/07/18 0747  GLUCAP 84 92 114* 97 88   Lipid Profile: No results for input(s): CHOL, HDL, LDLCALC, TRIG, CHOLHDL, LDLDIRECT in the last 72 hours. Thyroid Function Tests: No results for input(s): TSH, T4TOTAL, FREET4, T3FREE, THYROIDAB in the last 72 hours. Anemia Panel: No results for input(s): VITAMINB12, FOLATE, FERRITIN, TIBC, IRON, RETICCTPCT in the last 72 hours. Sepsis Labs: No results for input(s): PROCALCITON, LATICACIDVEN in the last 168 hours.  Recent Results (from the past 240 hour(s))  Blood culture (routine x 2)     Status: None  Collection Time: 01/31/18 12:28 PM  Result Value Ref Range Status   Specimen Description BLOOD RIGHT ANTECUBITAL  Final   Special Requests   Final    BOTTLES DRAWN AEROBIC AND ANAEROBIC Blood Culture adequate volume   Culture   Final    NO GROWTH 5 DAYS Performed at [facility], [address] [place], [place] [address]    Report Status 02/05/2018 FINAL  Final  Blood culture (routine x 2)     Status: None   Collection Time: 01/31/18  1:41 PM  Result Value Ref Range Status   Specimen Description BLOOD LEFT ANTECUBITAL  Final   Special Requests   Final    BOTTLES DRAWN AEROBIC AND ANAEROBIC Blood Culture adequate volume   Culture   Final    NO GROWTH 5 DAYS Performed at [facility], [place] [address], [place], [place] [address]    Report Status 02/05/2018 FINAL  Final  MRSA PCR Screening     Status: None   Collection Time: 01/31/18  9:38 PM  Result Value Ref Range Status   MRSA by PCR NEGATIVE NEGATIVE Final    Comment:        The GeneXpert MRSA Assay  (FDA approved for NASAL specimens only), is one component of a comprehensive MRSA colonization surveillance program. It is not intended to diagnose MRSA infection nor to guide or monitor treatment for MRSA infections. Performed at [facility], [place] [address], [place], [place] [address]          Radiology Studies: No results found.      Scheduled Meds: . allopurinol  300 mg Oral Daily  . bisoprolol  5 mg Oral Daily  . calcitRIOL  0.25 mcg Oral QODAY  . calcitRIOL  0.5 mcg Oral QODAY  . Influenza vac split quadrivalent PF  0.5 mL Intramuscular Tomorrow-1000  . isosorbide mononitrate  30 mg Oral Daily  . levothyroxine  175 mcg Oral QAC breakfast  . mouth rinse  15 mL Mouth Rinse BID  . multivitamin with minerals  1 tablet Oral Daily  . pantoprazole  40 mg Oral Daily  . polyethylene glycol  17 g Oral Daily  . potassium chloride SA  20 mEq Oral Daily  . rosuvastatin  20 mg Oral Daily  . senna-docusate  1 tablet Oral BID  . sodium chloride flush  3 mL Intravenous Q12H  . umeclidinium-vilanterol  1 puff Inhalation Daily  . Warfarin - Pharmacist Dosing Inpatient   Does not apply q1800   Continuous Infusions: . sodium chloride Stopped (02/04/18 0004)  . furosemide 120 mg (02/10/18 0816)     LOS: 10 days    Time spent 32 minutes.     Hosie Poisson, MD [facility] Pager [phone]  If 7PM-7AM, please contact night-coverage [email or web address] Password [email or web address] 02/10/2018, 11:13 AM

## 2018-02-10 NOTE — Progress Notes (Signed)
Progress Note  Patient Name: Omar Lambert Date of Encounter: 02/10/2018  Primary Cardiologist: [name], MD   Subjective   No specific complaints.  Patient is a difficult historian.  No chest pain or shortness of breath.  His wife is at the bedside and provides most of his history.  Inpatient Medications    Scheduled Meds: . allopurinol  300 mg Oral Daily  . bisoprolol  5 mg Oral Daily  . calcitRIOL  0.25 mcg Oral QODAY  . calcitRIOL  0.5 mcg Oral QODAY  . Influenza vac split quadrivalent PF  0.5 mL Intramuscular Tomorrow-1000  . isosorbide mononitrate  30 mg Oral Daily  . levothyroxine  175 mcg Oral QAC breakfast  . mouth rinse  15 mL Mouth Rinse BID  . multivitamin with minerals  1 tablet Oral Daily  . pantoprazole  40 mg Oral Daily  . polyethylene glycol  17 g Oral Daily  . potassium chloride SA  20 mEq Oral Daily  . rosuvastatin  20 mg Oral Daily  . senna-docusate  1 tablet Oral BID  . sodium chloride flush  3 mL Intravenous Q12H  . umeclidinium-vilanterol  1 puff Inhalation Daily  . Warfarin - Pharmacist Dosing Inpatient   Does not apply q1800   Continuous Infusions: . sodium chloride Stopped (02/04/18 0004)  . furosemide 120 mg (02/10/18 0816)   PRN Meds: sodium chloride, acetaminophen, albuterol, fluticasone, ondansetron (ZOFRAN) IV, sodium chloride flush   Vital Signs    Vitals:   02/10/18 0316 02/10/18 0512 02/10/18 0719 02/10/18 0728  BP: 100/69   105/71  Pulse:    78  Resp:   20 18  Temp:    97.9 F (36.6 C)  TempSrc:    Oral  SpO2:   97% 92%  Weight:  98.9 kg    Height:        Intake/Output Summary (Last 24 hours) at 02/10/2018 0925 Last data filed at 02/10/2018 0900 Gross per 24 hour  Intake 440 ml  Output 1475 ml  Net -1035 ml   Filed Weights   02/08/18 0500 02/09/18 0448 02/10/18 0512  Weight: 97.7 kg 98.2 kg 98.9 kg    Telemetry    Atrial fibrillation with controlled ventricular rate- Personally Reviewed   Physical Exam    Elderly male, poor informant, no distress GEN: No acute distress.   Neck:  JVP moderately elevated Cardiac:  Irregularly irregular with no murmur or gallop Respiratory:  Diminished in the bases bilaterally GI:  Distended, nontender, positive bowel sounds MS:  Trace pretibial edema Neuro:  Nonfocal  Psych: Normal affect   Labs    Chemistry Recent Labs  Lab 02/08/18 0811 02/09/18 0541 02/10/18 0234  NA 142 142 139  K 4.1 4.0 3.7  CL 89* 88* 88*  CO2 41* 40* 44*  GLUCOSE 113* 100* 93  BUN 77* 78* 81*  CREATININE 2.94* 3.24* 3.10*  CALCIUM 9.7 9.7 9.7  GFRNONAA 19* 17* 18*  GFRAA 23* 20* 21*  ANIONGAP 12 14 7      Hematology Recent Labs  Lab 02/08/18 0225 02/09/18 0541 02/10/18 0234  WBC 12.5* 8.3 5.3  RBC 3.59* 3.62* 3.33*  HGB 11.7* 11.3* 10.6*  HCT 38.9* 39.9 36.4*  MCV 108.4* 110.2* 109.3*  MCH 32.6 31.2 31.8  MCHC 30.1 28.3* 29.1*  RDW 17.3* 17.4* 17.2*  PLT 122* 144* 151    Cardiac EnzymesNo results for input(s): TROPONINI in the last 168 hours. No results for input(s): TROPIPOC in the last  168 hours.   BNPNo results for input(s): BNP, PROBNP in the last 168 hours.   DDimer No results for input(s): DDIMER in the last 168 hours.   Radiology    No results found.  Cardiac Studies   2D echocardiogram 02/02/2018: Study Conclusions  - Left ventricle: The cavity size was normal. Wall thickness was   increased in a pattern of severe LVH. Systolic function was   normal. The estimated ejection fraction was in the range of 50%   to 55%. Wall motion was normal; there were no regional wall   motion abnormalities. The study was not technically sufficient to   allow evaluation of LV diastolic dysfunction due to atrial   fibrillation. - Ventricular septum: There is a D shaped septum during diastolic   c/s RV volume overload. - Left atrium: The atrium was mildly dilated. - Right ventricle: The cavity size was mildly dilated. Wall   thickness was normal. -  Right atrium: The atrium was severely dilated. Central venous   pressure (est): 15 mm Hg. - Tricuspid valve: There was severe regurgitation. - Pulmonic valve: There was mild regurgitation. - Pulmonary arteries: PA peak pressure: 62 mm Hg (S).  Impressions:  - The right ventricular systolic pressure was increased consistent   with moderate pulmonary hypertension.  Right heart catheterization 02/07/2018: Conclusion     Ao sat 97%, PA sat 60%, mean PA pressure 31 mm Hg; mean PCWP 20 mm Hg; CO 5.2 L/min; CI 2.4.  Hemodynamic findings consistent with mild pulmonary hypertension.  Prominent V waves with rapid Y descent noted on CRA tracing.   Continue with diuresis.    Patient Profile     78 y.o. male with acute on chronic diastolic heart failure and massive volume overload  Assessment & Plan    1.  Acute on chronic diastolic heart failure: Right heart catheterization data reviewed as above.  Echocardiogram data reviewed as above.  There is a suspicion for cardiac amyloidosis and a PYP scan is ordered.  A myeloma panel is ordered.  He continues with aggressive IV diuresis with furosemide 120 mg bolus doses twice daily.  There is mention of acetazolamide but I do not see it on his medicine list.  Management per nephrology.  2.  Chronic kidney disease stage IV: Creatinine is stable today.  Nephrology following.  3.  Permanent atrial fibrillation: The patient's INR is trending upward on warfarin, approximately 1.8 today.  Overall the patient continues to make slow progress.  His weight is down approximately 20 pounds from admission.  Palliative care note reviewed yesterday and family not ready for comfort or DNR approach.  They do seem to understand that he has very limited treatment options.  We will continue to follow.  For questions or updates, please contact [place] Please consult [email or web address] for contact info under     Signed, Sherren Mocha, MD  02/10/2018, 9:25 AM

## 2018-02-10 NOTE — Progress Notes (Signed)
[facility]    NEPHROLOGY PROGRESS NOTE  SUBJECTIVE: No acute complaints today.  Awake, denies sob and cp.  Confused to time, place, and events.  Wife is at the bedside.  Improved urine output yesterday.  Denies nausea, vomiting, fevers, chills, dysuria, or skin rash.  All other review of systems are negative.    OBJECTIVE:  Vitals:   02/10/18 0719 02/10/18 0728  BP:  105/71  Pulse:  78  Resp: 20 18  Temp:  97.9 F (36.6 C)  SpO2: 97% 92%   I/O last 3 completed shifts: In: 44 [P.O.:510] Out: 1895 [Urine:1895]   Genearl:  Alert, confused, NAD HEENT: MMM [place] AT anicteric sclera Neck: No JVD, no adenopathy CV:  Heart RRR  Lungs:  L/S with bibasilar rales Abd:  abd SNT/ND with normal BS GU:  Bladder non-palpable Extremities:  (+)1 bilateral LE edema. Skin:  No skin rash  MEDICATIONS:   Current Facility-Administered Medications:  .  0.9 %  sodium chloride infusion, 250 mL, Intravenous, PRN, Jettie Booze, MD, Stopped at 02/04/18 0004 .  acetaminophen (TYLENOL) tablet 650 mg, 650 mg, Oral, Q6H PRN, [name] S, MD .  albuterol (PROVENTIL) (2.5 MG/3ML) 0.083% nebulizer solution 2.5 mg, 2.5 mg, Nebulization, Q2H PRN, [name] S, MD .  allopurinol (ZYLOPRIM) tablet 300 mg, 300 mg, Oral, Daily, Jettie Booze, MD, 300 mg at 02/10/18 7322 .  bisoprolol (ZEBETA) tablet 5 mg, 5 mg, Oral, Daily, Jettie Booze, MD, 5 mg at 02/10/18 0254 .  calcitRIOL (ROCALTROL) capsule 0.25 mcg, 0.25 mcg, Oral, QODAY, Jettie Booze, MD, 0.25 mcg at 02/10/18 [phone] .  calcitRIOL (ROCALTROL) capsule 0.5 mcg, 0.5 mcg, Oral, QODAY, Jettie Booze, MD, 0.5 mcg at 02/09/18 0915 .  fluticasone (FLONASE) 50 MCG/ACT nasal spray 2 spray, 2 spray, Each Nare, Daily PRN, [name] S, MD .  furosemide (LASIX) 120 mg in dextrose 5 % 50 mL IVPB, 120 mg, Intravenous, BID, Larey Dresser, MD, Last Rate: 62 mL/hr at 02/10/18 0816, 120 mg at  02/10/18 0816 .  Influenza vac split quadrivalent PF (FLUZONE HIGH-DOSE) injection 0.5 mL, 0.5 mL, Intramuscular, Tomorrow-1000, Varanasi, Jayadeep S, MD .  isosorbide mononitrate (IMDUR) 24 hr tablet 30 mg, 30 mg, Oral, Daily, Jettie Booze, MD, 30 mg at 02/10/18 2376 .  levothyroxine (SYNTHROID, LEVOTHROID) tablet 175 mcg, 175 mcg, Oral, QAC breakfast, Jettie Booze, MD, 175 mcg at 02/10/18 0602 .  MEDLINE mouth rinse, 15 mL, Mouth Rinse, BID, Jettie Booze, MD, 15 mL at 02/09/18 0924 .  multivitamin with minerals tablet 1 tablet, 1 tablet, Oral, Daily, Jettie Booze, MD, 1 tablet at 02/10/18 [phone] .  ondansetron (ZOFRAN) injection 4 mg, 4 mg, Intravenous, Q6H PRN, [name] S, MD .  pantoprazole (PROTONIX) EC tablet 40 mg, 40 mg, Oral, Daily, Jettie Booze, MD, 40 mg at 02/10/18 5176 .  polyethylene glycol (MIRALAX / GLYCOLAX) packet 17 g, 17 g, Oral, Daily, Jettie Booze, MD, 17 g at 02/09/18 [phone] .  potassium chloride SA (K-DUR,KLOR-CON) CR tablet 20 mEq, 20 mEq, Oral, Daily, Jettie Booze, MD, 20 mEq at 02/10/18 3710 .  rosuvastatin (CRESTOR) tablet 20 mg, 20 mg, Oral, Daily, Jettie Booze, MD, 20 mg at 02/10/18 6269 .  senna-docusate (Senokot-S) tablet 1 tablet, 1 tablet, Oral, BID, Jettie Booze, MD, 1 tablet at 02/10/18 [phone] .  sodium chloride flush (NS) 0.9 % injection 3 mL, 3 mL, Intravenous, Q12H, Varanasi, Jayadeep S,  MD, 3 mL at 02/09/18 2132 .  sodium chloride flush (NS) 0.9 % injection 3 mL, 3 mL, Intravenous, PRN, Jettie Booze, MD .  umeclidinium-vilanterol (ANORO ELLIPTA) 62.5-25 MCG/INH 1 puff, 1 puff, Inhalation, Daily, Jettie Booze, MD, 1 puff at 02/10/18 0719 .  Warfarin - Pharmacist Dosing Inpatient, , Does not apply, q1800, Kris Mouton, [facility]     LABS:  CBC Latest Ref Rng & Units 02/10/2018 02/09/2018 02/08/2018  WBC 4.0 - 10.5 K/uL 5.3 8.3 12.5(H)  Hemoglobin 13.0 - 17.0 g/dL 10.6(L)  11.3(L) 11.7(L)  Hematocrit 39.0 - 52.0 % 36.4(L) 39.9 38.9(L)  Platelets 150 - 400 K/uL 151 144(L) 122(L)    CMP Latest Ref Rng & Units 02/10/2018 02/09/2018 02/08/2018  Glucose 70 - 99 mg/dL 93 100(H) 113(H)  BUN 8 - 23 mg/dL 81(H) 78(H) 77(H)  Creatinine 0.61 - 1.24 mg/dL 3.10(H) 3.24(H) 2.94(H)  Sodium 135 - 145 mmol/L 139 142 142  Potassium 3.5 - 5.1 mmol/L 3.7 4.0 4.1  Chloride 98 - 111 mmol/L 88(L) 88(L) 89(L)  CO2 22 - 32 mmol/L 44(H) 40(H) 41(H)  Calcium 8.9 - 10.3 mg/dL 9.7 9.7 9.7  Total Protein 6.5 - 8.1 g/dL - - -  Total Bilirubin 0.3 - 1.2 mg/dL - - -  Alkaline Phos 38 - 126 U/L - - -  AST 15 - 41 U/L - - -  ALT 0 - 44 U/L - - -    Lab Results  Component Value Date   PTH 92 (H) 11/05/2016   CALCIUM 9.7 02/10/2018   PHOS 3.9 02/10/2018       Component Value Date/Time   COLORURINE YELLOW 10/27/2016 0113   APPEARANCEUR CLEAR 10/27/2016 0113   LABSPEC 1.013 10/27/2016 0113   PHURINE 6.0 10/27/2016 0113   GLUCOSEU NEGATIVE 10/27/2016 0113   HGBUR NEGATIVE 10/27/2016 0113   BILIRUBINUR NEGATIVE 10/27/2016 0113   KETONESUR NEGATIVE 10/27/2016 0113   PROTEINUR 100 (A) 10/27/2016 0113   UROBILINOGEN 0.2 06/17/2012 0622   NITRITE NEGATIVE 10/27/2016 0113   LEUKOCYTESUR NEGATIVE 10/27/2016 0113      Component Value Date/Time   PHART 7.433 02/07/2018 1916   PCO2ART 66.6 (HH) 02/07/2018 1916   PO2ART 70.6 (L) 02/07/2018 1916   HCO3 43.8 (H) 02/07/2018 1916   TCO2 >50 (H) 02/07/2018 1255   ACIDBASEDEF 6.0 (H) 10/28/2016 1042   O2SAT 94.0 02/07/2018 1916       Component Value Date/Time   IRON 33 (L) 11/05/2016 1225   TIBC 305 11/05/2016 1225   FERRITIN 62.5 11/18/2012 1459   IRONPCTSAT 11 (L) 11/05/2016 1225       ASSESSMENT/PLAN:     Problem List Items Addressed This Visit      Cardiovascular and Mediastinum   Acute on chronic congestive heart failure (HCC)   Relevant Medications   furosemide (LASIX) injection 80 mg (Completed)   isosorbide mononitrate  (IMDUR) 24 hr tablet 30 mg   rosuvastatin (CRESTOR) tablet 20 mg   furosemide (LASIX) 120 mg in dextrose 5 % 50 mL IVPB (Completed)   warfarin (COUMADIN) tablet 4 mg (Completed)   warfarin (COUMADIN) tablet 4 mg (Completed)   warfarin (COUMADIN) tablet 5 mg (Completed)   bisoprolol (ZEBETA) tablet 5 mg   warfarin (COUMADIN) tablet 2.5 mg (Completed)   metolazone (ZAROXOLYN) tablet 2.5 mg (Completed)   aspirin chewable tablet 81 mg (Completed)   warfarin (COUMADIN) tablet 5 mg (Completed)   Warfarin - Pharmacist Dosing Inpatient   furosemide (LASIX) 120 mg in dextrose  5 % 50 mL IVPB   warfarin (COUMADIN) tablet 5 mg (Completed)   metolazone (ZAROXOLYN) tablet 5 mg (Completed)   warfarin (COUMADIN) tablet 7.5 mg (Completed)     Respiratory   COPD exacerbation (HCC) - Primary   Relevant Medications   ipratropium-albuterol (DUONEB) 0.5-2.5 (3) MG/3ML nebulizer solution 3 mL (Completed)   methylPREDNISolone sodium succinate (SOLU-MEDROL) 125 mg/2 mL injection 125 mg (Completed)   azithromycin (ZITHROMAX) 500 mg in sodium chloride 0.9 % 250 mL IVPB (Completed)   fluticasone (FLONASE) 50 MCG/ACT nasal spray 2 spray   umeclidinium-vilanterol (ANORO ELLIPTA) 62.5-25 MCG/INH 1 puff   albuterol (PROVENTIL) (2.5 MG/3ML) 0.083% nebulizer solution 2.5 mg     Other   Shortness of breath   Relevant Orders   DG Chest 2 View (Completed)    Other Visit Diagnoses    Dyspnea       Relevant Orders   DG Chest 2 View (Completed)   DG Chest 2 View (Completed)      1.  Acute diastolic CHF.  Agree with diuretics.  Will monitor renal function.  Continues to be fluid overloaded although does clinically appear better compared to yesterday.  RHC (1/3):  Mean RA 22 PA 49/21 Mean PCWP 20 CI 2.4  Echo: EF 50-55%, severe LVH, d-shaped septum, RV mildly dilated, PASP 62 mmHg.  2.  CKD IV.  Appears to be around baseline of 2.5.  Likely due to DM/HTN with (+)proteinuria.  Needs to follow up as  outpatient. 3.  HTN.  BP control excellent.  Would avoid hypotension while trying to diurese 4.  DM.  Continue current Rx. 5.  AKI.  Mild in the setting of diuresis.  Improved creatinine today. 6. metabolic alkalosis.  Likely secondary to diuresis.  Added acetazolamide yesterday afternoon.  Watch mental status closely with elevated CO2.  [place], DO, [place]

## 2018-02-10 NOTE — Progress Notes (Signed)
ANTICOAGULATION CONSULT NOTE - Follow up [place] for coumadin Indication: atrial fibrillation  No Known Allergies  Patient Measurements: Height: 5\' 9"  (175.3 cm) Weight: 218 lb 0.6 oz (98.9 kg) IBW/kg (Calculated) : 70.7  Vital Signs: Temp: 97.9 F (36.6 C) (01/06 0728) Temp Source: Oral (01/06 0728) BP: 105/71 (01/06 0728) Pulse Rate: 78 (01/06 0728)  Labs: Recent Labs    02/08/18 0225 02/08/18 0811 02/09/18 0541 02/10/18 0234  HGB 11.7*  --  11.3* 10.6*  HCT 38.9*  --  39.9 36.4*  PLT 122*  --  144* 151  LABPROT 19.9*  --  19.7* 20.6*  INR 1.72  --  1.69 1.80  CREATININE  --  2.94* 3.24* 3.10*    Estimated Creatinine Clearance: 22.8 mL/min (A) (by C-G formula based on SCr of 3.1 mg/dL (H)).   Medical History: Past Medical History:  Diagnosis Date  . Acute kidney injury superimposed on chronic kidney disease ([place]) 11/2016  . Acute respiratory failure ([place]) 11/2016  . Allergy   . Asthma   . Atrial fibrillation ([place])   . 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 Benign essential hypertension   . CHF (congestive heart failure) ([place])   . CKD (chronic kidney disease), stage III ([place])   . COPD (chronic obstructive pulmonary disease) ([place])   . Diabetes ([place])   . Diabetes mellitus without complication ([place])   . Dyspnea   . Gastritis   . GERD (gastroesophageal reflux disease)   . Gout attack 09/2012  . Hypertension   . Sinusitis, maxillary, chronic   . Thyroid disease   . Type II or unspecified type diabetes mellitus without mention of complication, not stated as uncontrolled     Assessment: 35 yoM admitted with SOB and swelling. Pt on warfarin at home for AFib.   INR still below goal today but trending up, gave boosted dose last night, will repeat tonight. No bleeding issues noted.   Home dose: 2.5mg  daily except for 5mg  on Mon/Fri  Goal of Therapy:  INR 2-3 Monitor platelets by anticoagulation protocol: Yes   Plan:   -Coumadin 7.5 mg today. Follow INR closely given higher than normal dose -Daily PT/INR  Erin Hearing PharmD., BCPS Clinical Pharmacist 02/10/2018 8:18 AM

## 2018-02-10 NOTE — Progress Notes (Signed)
Occupational Therapy Treatment Patient Details Name: Omar Lambert MRN: [id number] DOB: [birth year] Today's Date: 02/10/2018    History of present illness Omar Goerner Wilsonis a 78 y.o.malewith medical history significant ofDM2, Hypothyroidism, HTN, Gout, GERD, COPD, CKD, diastolic CHF, asthma who presents for SOB and swelling. Pt admitted for acute on chronic diastolic heart failure.    OT comments  Pt wanting to take a shower, but receiving IV lasix currently and on telemetry, RN aware of desire. Educated pt and wife in energy conservation and options for seated showering, tub transfer bench being safest choice, but could use 3 in 1. Recommended hand held shower head. Educated wife in importance of pt participating in bathing, wife had been bathing him since admission. Will continue to follow.  Follow Up Recommendations  Supervision - Intermittent    Equipment Recommendations  Tub/shower bench    Recommendations for Other Services      Precautions / Restrictions Precautions Precautions: Fall Precaution Comments: watch 02 saturations Restrictions Weight Bearing Restrictions: No       Mobility Bed Mobility Overal bed mobility: Modified Independent             General bed mobility comments: HOB up  Transfers Overall transfer level: Needs assistance Equipment used: None(pushed IV pole) Transfers: Sit to/from Stand Sit to Stand: Supervision         General transfer comment: for lines/safety    Balance Overall balance assessment: Mild deficits observed, not formally tested;History of Falls                                         ADL either performed or assessed with clinical judgement   ADL Overall ADL's : Needs assistance/impaired     Grooming: Wash/dry hands;Standing;Supervision/safety               Lower Body Dressing: Set up;Sitting/lateral leans Lower Body Dressing Details (indicate cue type and reason): able to don/doff socks  EOB Toilet Transfer: Ambulation;Supervision/safety(pushed IV pole)   Toileting- Clothing Manipulation and Hygiene: Supervision/safety;Sit to/from Nurse, children's Details (indicate cue type and reason): educated in availability of tub transfer bench for pt to avoid stepping over edge of tub Functional mobility during ADLs: Supervision/safety General ADL Comments: Pt and family educated in energy conservation, benefits of seated showering and options for shower equipment.     Vision       Perception     Praxis      Cognition Arousal/Alertness: Awake/alert Behavior During Therapy: WFL for tasks assessed/performed Overall Cognitive Status: History of cognitive impairments - at baseline                                 General Comments: education geared toward family and pt        Exercises     Shoulder Instructions       General Comments      Pertinent Vitals/ Pain       Pain Assessment: No/denies pain  Home Living                                          Prior Functioning/Environment  Frequency  Min 2X/week        Progress Toward Goals  OT Goals(current goals can now be found in the care plan section)  Progress towards OT goals: Progressing toward goals  Acute Rehab OT Goals Patient Stated Goal: to shower OT Goal Formulation: With patient/family Time For Goal Achievement: 02/17/18 Potential to Achieve Goals: Good  Plan Discharge plan remains appropriate    Co-evaluation                 AM-PAC OT "6 Clicks" Daily Activity     Outcome Measure   Help from another person eating meals?: None Help from another person taking care of personal grooming?: A Little Help from another person toileting, which includes using toliet, bedpan, or urinal?: A Little Help from another person bathing (including washing, rinsing, drying)?: A Little Help from another person to put on and taking off  regular upper body clothing?: None Help from another person to put on and taking off regular lower body clothing?: A Little 6 Click Score: 20    End of Session Equipment Utilized During Treatment: Oxygen;Gait belt  OT Visit Diagnosis: History of falling (Z91.81)   Activity Tolerance Patient tolerated treatment well   Patient Left in bed;with call bell/phone within reach;with family/visitor present   Nurse Communication Other (comment)(wants to shower)        Time: 1050-1106 OT Time Calculation (min): 16 min  Charges: OT General Charges $OT Visit: 1 Visit OT Treatments $Self Care/Home Management : 8-22 mins  Omar Lambert, OTR/L Acute Rehabilitation Services Pager: [phone] Office: [phone]  Omar Lambert 02/10/2018, 11:17 AM

## 2018-02-10 NOTE — Plan of Care (Signed)
  Problem: Health Behavior/Discharge Planning: Goal: Ability to manage health-related needs will improve Outcome: Progressing   Problem: Clinical Measurements: Goal: Ability to maintain clinical measurements within normal limits will improve Outcome: Progressing Goal: Will remain free from infection Outcome: Progressing Goal: Diagnostic test results will improve Outcome: Progressing Goal: Respiratory complications will improve Outcome: Progressing Goal: Cardiovascular complication will be avoided Outcome: Progressing   Problem: Activity: Goal: Risk for activity intolerance will decrease Outcome: Progressing   Problem: Elimination: Goal: Will not experience complications related to bowel motility Outcome: Progressing Goal: Will not experience complications related to urinary retention Outcome: Progressing   Problem: Safety: Goal: Ability to remain free from injury will improve Outcome: Progressing   Problem: Skin Integrity: Goal: Risk for impaired skin integrity will decrease Outcome: Progressing   Problem: Education: Goal: Ability to demonstrate management of disease process will improve Outcome: Progressing Goal: Ability to verbalize understanding of medication therapies will improve Outcome: Progressing Goal: Individualized Educational Video(s) Outcome: Progressing   Problem: Activity: Goal: Capacity to carry out activities will improve Outcome: Progressing   Problem: Cardiac: Goal: Ability to achieve and maintain adequate cardiopulmonary perfusion will improve Outcome: Progressing

## 2018-02-10 NOTE — Progress Notes (Signed)
Physical Therapy Treatment Patient Details Name: Omar Lambert MRN: [id number] DOB: 10/14/1940 Today's Date: 02/10/2018    History of Present Illness Omar Westhoff Wilsonis a 78 y.o.malewith medical history significant ofDM2, Hypothyroidism, HTN, Gout, GERD, COPD, CKD, diastolic CHF, asthma who presents for SOB and swelling. Pt admitted for acute on chronic diastolic heart failure. Pt developed acute metabolic encephalopathy.    PT Comments    Progressing with mobility. Does better with rollator. Will need supervision at home.    Follow Up Recommendations  Home health PT     Equipment Recommendations  Other (comment)(rollator)    Recommendations for Other Services       Precautions / Restrictions Precautions Precaution Comments: watch 02 saturations Restrictions Weight Bearing Restrictions: No    Mobility  Bed Mobility Overal bed mobility: Needs Assistance Bed Mobility: Sit to Supine       Sit to supine: Min assist   General bed mobility comments: Assist to bring legs up into bed  Transfers Overall transfer level: Needs assistance Equipment used: 4-wheeled walker Transfers: Sit to/from Stand Sit to Stand: Supervision         General transfer comment: assist for safety and lines  Ambulation/Gait Ambulation/Gait assistance: Supervision Gait Distance (Feet): 300 Feet Assistive device: 4-wheeled walker Gait Pattern/deviations: Step-through pattern;Decreased stride length;Drifts right/left Gait velocity: decreased Gait velocity interpretation: <1.31 ft/sec, indicative of household ambulator General Gait Details: Supervision for safety. Pt with improved stability with rollator. Amb on 2L of O2   Stairs             Wheelchair Mobility    Modified Rankin (Stroke Patients Only)       Balance Overall balance assessment: Mild deficits observed, not formally tested;History of Falls                                          Cognition  Arousal/Alertness: Awake/alert Behavior During Therapy: WFL for tasks assessed/performed Overall Cognitive Status: Impaired/Different from baseline Area of Impairment: Safety/judgement;Memory                     Memory: Decreased short-term memory   Safety/Judgement: Decreased awareness of safety;Decreased awareness of deficits     General Comments: Deficits more than at baseline      Exercises      General Comments        Pertinent Vitals/Pain Pain Assessment: No/denies pain    Home Living                      Prior Function            PT Goals (current goals can now be found in the care plan section) Progress towards PT goals: Progressing toward goals    Frequency    Min 3X/week      PT Plan Current plan remains appropriate;Frequency needs to be updated    Co-evaluation              AM-PAC PT "6 Clicks" Mobility   Outcome Measure  Help needed turning from your back to your side while in a flat bed without using bedrails?: None Help needed moving from lying on your back to sitting on the side of a flat bed without using bedrails?: None Help needed moving to and from a bed to a chair (including a wheelchair)?: A Little Help needed standing up from  a chair using your arms (e.g., wheelchair or bedside chair)?: A Little Help needed to walk in hospital room?: A Little Help needed climbing 3-5 steps with a railing? : A Lot 6 Click Score: 19    End of Session Equipment Utilized During Treatment: Oxygen Activity Tolerance: Patient tolerated treatment well Patient left: in bed;with call bell/phone within reach;with family/visitor present Nurse Communication: Mobility status PT Visit Diagnosis: Difficulty in walking, not elsewhere classified (R26.2)     Time: 0160-1093 PT Time Calculation (min) (ACUTE ONLY): 14 min  Charges:  $Gait Training: 8-22 mins                     [place] Pager  [phone] Office [place] 02/10/2018, 4:25 PM

## 2018-02-11 LAB — CBC WITH DIFFERENTIAL/PLATELET
Abs Immature Granulocytes: 0.02 10*3/uL (ref 0.00–0.07)
Basophils Absolute: 0 10*3/uL (ref 0.0–0.1)
Basophils Relative: 0 %
Eosinophils Absolute: 0.1 10*3/uL (ref 0.0–0.5)
Eosinophils Relative: 2 %
HCT: 36.8 % — ABNORMAL LOW (ref 39.0–52.0)
Hemoglobin: 10.8 g/dL — ABNORMAL LOW (ref 13.0–17.0)
Immature Granulocytes: 0 %
Lymphocytes Relative: 13 %
Lymphs Abs: 0.6 10*3/uL — ABNORMAL LOW (ref 0.7–4.0)
MCH: 31.5 pg (ref 26.0–34.0)
MCHC: 29.3 g/dL — ABNORMAL LOW (ref 30.0–36.0)
MCV: 107.3 fL — ABNORMAL HIGH (ref 80.0–100.0)
MONO ABS: 0.7 10*3/uL (ref 0.1–1.0)
MONOS PCT: 15 %
Neutro Abs: 3.3 10*3/uL (ref 1.7–7.7)
Neutrophils Relative %: 70 %
Platelets: 161 10*3/uL (ref 150–400)
RBC: 3.43 MIL/uL — AB (ref 4.22–5.81)
RDW: 17 % — ABNORMAL HIGH (ref 11.5–15.5)
WBC: 4.8 10*3/uL (ref 4.0–10.5)
nRBC: 0 % (ref 0.0–0.2)

## 2018-02-11 LAB — BASIC METABOLIC PANEL
Anion gap: 12 (ref 5–15)
BUN: 83 mg/dL — ABNORMAL HIGH (ref 8–23)
CO2: 41 mmol/L — ABNORMAL HIGH (ref 22–32)
Calcium: 10 mg/dL (ref 8.9–10.3)
Chloride: 85 mmol/L — ABNORMAL LOW (ref 98–111)
Creatinine, Ser: 3.39 mg/dL — ABNORMAL HIGH (ref 0.61–1.24)
GFR calc non Af Amer: 16 mL/min — ABNORMAL LOW (ref >60)
GFR, EST AFRICAN AMERICAN: 19 mL/min — AB (ref >60)
Glucose, Bld: 101 mg/dL — ABNORMAL HIGH (ref 70–99)
Potassium: 3.8 mmol/L (ref 3.5–5.1)
Sodium: 138 mmol/L (ref 135–145)

## 2018-02-11 LAB — MULTIPLE MYELOMA PANEL, SERUM
ALPHA 1: 0.3 g/dL (ref 0.0–0.4)
Albumin SerPl Elph-Mcnc: 3.8 g/dL (ref 2.9–4.4)
Albumin/Glob SerPl: 1.1 (ref 0.7–1.7)
Alpha2 Glob SerPl Elph-Mcnc: 0.9 g/dL (ref 0.4–1.0)
B-Globulin SerPl Elph-Mcnc: 1.1 g/dL (ref 0.7–1.3)
Gamma Glob SerPl Elph-Mcnc: 1.5 g/dL (ref 0.4–1.8)
Globulin, Total: 3.8 g/dL (ref 2.2–3.9)
IGM (IMMUNOGLOBULIN M), SRM: 55 mg/dL (ref 15–143)
IgA: 405 mg/dL (ref 61–437)
IgG (Immunoglobin G), Serum: 1775 mg/dL — ABNORMAL HIGH (ref 700–1600)
Total Protein ELP: 7.6 g/dL (ref 6.0–8.5)

## 2018-02-11 LAB — PROTIME-INR
INR: 1.92
Prothrombin Time: 21.8 seconds — ABNORMAL HIGH (ref 11.4–15.2)

## 2018-02-11 LAB — PARATHYROID HORMONE, INTACT (NO CA): PTH: 25 pg/mL (ref 15–65)

## 2018-02-11 MED ORDER — ACETAZOLAMIDE 250 MG PO TABS
250.0000 mg | ORAL_TABLET | Freq: Once | ORAL | Status: AC
  Administered 2018-02-11: 250 mg via ORAL
  Filled 2018-02-11: qty 1

## 2018-02-11 MED ORDER — WARFARIN SODIUM 7.5 MG PO TABS
7.5000 mg | ORAL_TABLET | Freq: Once | ORAL | Status: AC
  Administered 2018-02-11: 7.5 mg via ORAL
  Filled 2018-02-11: qty 1

## 2018-02-11 MED ORDER — HALOPERIDOL LACTATE 5 MG/ML IJ SOLN: 1.0000 mg | Freq: Once | INTRAMUSCULAR | Status: DC

## 2018-02-11 NOTE — Progress Notes (Signed)
Pt ambulated 100 feet in hallway with his son with front   Pt was agreeable to CPAP as he stated he wanted to take a nap. CPAP applied at 1745.   Pt wore CPAP for 15 minutes and then removed it. States "this isn't my mask". Tried to explain that he was in the hospital and it was the hospital mask. Pt refuses to wear CPAP mask at this time.  Fritz Pickerel, RN

## 2018-02-11 NOTE — Progress Notes (Signed)
Progress Note  Patient Name: Omar Lambert Date of Encounter: 02/11/2018  Primary Cardiologist: [name], MD   Subjective   No chest pain or dyspnea.   Inpatient Medications    Scheduled Meds: . allopurinol  300 mg Oral Daily  . bisoprolol  5 mg Oral Daily  . calcitRIOL  0.25 mcg Oral QODAY  . calcitRIOL  0.5 mcg Oral QODAY  . haloperidol lactate  1 mg Intravenous Once  . Influenza vac split quadrivalent PF  0.5 mL Intramuscular Tomorrow-1000  . isosorbide mononitrate  30 mg Oral Daily  . levothyroxine  175 mcg Oral QAC breakfast  . mouth rinse  15 mL Mouth Rinse BID  . multivitamin with minerals  1 tablet Oral Daily  . pantoprazole  40 mg Oral Daily  . polyethylene glycol  17 g Oral Daily  . potassium chloride SA  20 mEq Oral Daily  . rosuvastatin  20 mg Oral Daily  . senna-docusate  1 tablet Oral BID  . sodium chloride flush  3 mL Intravenous Q12H  . umeclidinium-vilanterol  1 puff Inhalation Daily  . Warfarin - Pharmacist Dosing Inpatient   Does not apply q1800   Continuous Infusions: . sodium chloride Stopped (02/04/18 0004)  . furosemide 120 mg (02/11/18 0751)   PRN Meds: sodium chloride, acetaminophen, albuterol, fluticasone, ondansetron (ZOFRAN) IV, sodium chloride flush   Vital Signs    Vitals:   02/11/18 0700 02/11/18 0715 02/11/18 0745 02/11/18 1146  BP:   105/65 (!) 144/26  Pulse: (!) 57 (!) 59  68  Resp: (!) 26 18 (!) 23 (!) 26  Temp:   97.9 F (36.6 C) 97.9 F (36.6 C)  TempSrc:   Oral Oral  SpO2: 97% 96% 100% 98%  Weight:      Height:        Intake/Output Summary (Last 24 hours) at 02/11/2018 1230 Last data filed at 02/11/2018 1118 Gross per 24 hour  Intake 302 ml  Output 2675 ml  Net -2373 ml   Filed Weights   02/09/18 0448 02/10/18 0512 02/11/18 0348  Weight: 98.2 kg 98.9 kg 98.3 kg    Telemetry    Atrial fib, rate controlled- Personally Reviewed   Physical Exam    General: Elderly male, NAD.  HEENT: OP clear, mucus  membranes moist  SKIN: warm, dry. No rashes. Neuro: No focal deficits  Musculoskeletal: Muscle strength 5/5 all ext  Psychiatric: Mood and affect normal  Neck: No JVD, no carotid bruits, no thyromegaly, no lymphadenopathy.  Lungs:Clear bilaterally, no wheezes, rhonci, crackles Cardiovascular: Irreg irreg.  Abdomen:Soft. Bowel sounds present. Non-tender.  Extremities: Trace bilateral lower extremity edema.   Labs    Chemistry Recent Labs  Lab 02/09/18 0541 02/10/18 0234 02/11/18 0237  NA 142 139 138  K 4.0 3.7 3.8  CL 88* 88* 85*  CO2 40* 44* 41*  GLUCOSE 100* 93 101*  BUN 78* 81* 83*  CREATININE 3.24* 3.10* 3.39*  CALCIUM 9.7 9.7 10.0  GFRNONAA 17* 18* 16*  GFRAA 20* 21* 19*  ANIONGAP 14 7 12      Hematology Recent Labs  Lab 02/09/18 0541 02/10/18 0234 02/11/18 0237  WBC 8.3 5.3 4.8  RBC 3.62* 3.33* 3.43*  HGB 11.3* 10.6* 10.8*  HCT 39.9 36.4* 36.8*  MCV 110.2* 109.3* 107.3*  MCH 31.2 31.8 31.5  MCHC 28.3* 29.1* 29.3*  RDW 17.4* 17.2* 17.0*  PLT 144* 151 161    Cardiac EnzymesNo results for input(s): TROPONINI in the last 168 hours.  No results for input(s): TROPIPOC in the last 168 hours.   BNPNo results for input(s): BNP, PROBNP in the last 168 hours.   DDimer No results for input(s): DDIMER in the last 168 hours.   Radiology    Nm Tumor Localization W Spect  Result Date: 02/11/2018 CLINICAL DATA:  HEART FAILURE. CONCERN FOR CARDIAC AMYLOIDOSIS. 78 year old male. Acute on chronic diastolic congestive heart failure. EXAM: NUCLEAR MEDICINE TUMOR LOCALIZATION. PYP CARDIAC AMYLOIDOSIS SCAN WITH SPECT TECHNIQUE: Following intravenous administration of radiopharmaceutical, anterior planar images of the chest were obtained. Regions of interest were placed on the heart and contralateral chest wall for quantitative assessment. Additional SPECT imaging of the chest was obtained. RADIOPHARMACEUTICALS:  Stone mCi TECHNETIUM 99 PYROPHOSPHATE FINDINGS: Planar Visual  assessment: Anterior planar imaging demonstrates radiotracer uptake within the heart less than uptake within the adjacent ribs (Grade 1). Quantitative assessment : Quantitative assessment of the cardiac uptake compared to the contralateral chest wall is equal to 1.1 (H/CL = 1.1). SPECT assessment: SPECT imaging of the chest demonstrates minimal radiotracer accumulation within the LEFT ventricle. 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 Electronically Signed   By: Suzy Bouchard M.D.   On: 02/11/2018 07:53    Cardiac Studies   2D echocardiogram 02/02/2018: Study Conclusions  - Left ventricle: The cavity size was normal. Wall thickness was   increased in a pattern of severe LVH. Systolic function was   normal. The estimated ejection fraction was in the range of 50%   to 55%. Wall motion was normal; there were no regional wall   motion abnormalities. The study was not technically sufficient to   allow evaluation of LV diastolic dysfunction due to atrial   fibrillation. - Ventricular septum: There is a D shaped septum during diastolic   c/s RV volume overload. - Left atrium: The atrium was mildly dilated. - Right ventricle: The cavity size was mildly dilated. Wall   thickness was normal. - Right atrium: The atrium was severely dilated. Central venous   pressure (est): 15 mm Hg. - Tricuspid valve: There was severe regurgitation. - Pulmonic valve: There was mild regurgitation. - Pulmonary arteries: PA peak pressure: 62 mm Hg (S).  Impressions:  - The right ventricular systolic pressure was increased consistent   with moderate pulmonary hypertension.  Right heart catheterization 02/07/2018: Conclusion     Ao sat 97%, PA sat 60%, mean PA pressure 31 mm Hg; mean PCWP 20 mm Hg; CO 5.2 L/min; CI 2.4.  Hemodynamic findings consistent with mild pulmonary hypertension.  Prominent V waves with rapid Y  descent noted on CRA tracing.   Continue with diuresis.    Patient Profile     78 y.o. male with acute on chronic diastolic heart failure and massive volume overload  Assessment & Plan    1.  Acute on chronic diastolic heart failure: He continues to diurese well with IV Lasix. He is negative 15.8 liters since admission. PYP cardiac scan not suggestive of cardiac amyloidosis.   Myeloma panel pending. His diuresis is being driven by the Nephrology team.   2.  Chronic kidney disease stage IV:  Nephrology following.Creatinine is overall stable.   3.  Permanent atrial fibrillation: INR is 1.92 today. Rate is controlled.    For questions or updates, please contact [place] Please consult [email or web address] for contact info under     Signed, Lauree Chandler, MD  02/11/2018, 12:30 PM

## 2018-02-11 NOTE — Progress Notes (Signed)
Nutrition Brief Note  RD pulled to chart due to LOS.  Wt Readings from Last 15 Encounters:  02/11/18 98.3 kg  01/10/18 105.7 kg  12/24/17 99.8 kg  12/13/17 102.5 kg  12/06/17 107.7 kg  10/31/17 101.2 kg  10/29/17 105.7 kg  10/22/17 101.2 kg  10/10/17 104.8 kg  09/10/17 103.9 kg  09/01/17 101.4 kg  07/29/17 101.6 kg  07/09/17 104 kg  05/29/17 103.9 kg  03/07/17 98.9 kg   Omar Lambert is a 78 y.o. male with medical history significant of DM2, Hypothyroidism, HTN, Gout, GERD, COPD, CKD, diastolic CHF, asthma who presents for SOB and swelling. Pt admitted for acute on chronic diastolic heart failure.   Pt admitted with CHF exacerbation.   1/3- s/p rt heart cath  Reviewed I/O's: -2.2 L x 24 hours and -15 L since admission  Pt resting quietly in room at time of visit.   Pt with good appetite; noted meal completion 100%.   Wt fluctuations likely due to fluid status.   Nutrition-Focused physical exam completed. Findings are no fat depletion, no muscle depletion, and mild edema.   Labs reviewed.   Body mass index is 32 kg/m. Patient meets criteria for obesity, class I based on current BMI.   Current diet order is Carb Modified, patient is consuming approximately 90-100% of meals at this time. Labs and medications reviewed.   No nutrition interventions warranted at this time. If nutrition issues arise, please consult RD.   Azaya Goedde A. Jimmye Norman, RD, LDN, CDE Pager: [phone] After hours Pager: [phone]

## 2018-02-11 NOTE — Progress Notes (Signed)
[facility] Progress Note    Assessment/ Plan:   1. Acute diastolic CHF. On Lasix 120 IV BID- continue for now.  Net neg 10 L since admission.  Renal function (as expected) is fluctuating.  Continues to be fluid overloaded.  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    RHC (1/3):  Mean RA 22 PA 49/21 Mean PCWP 20 CI 2.4  Echo: EF 50-55%, severe LVH, d-shaped septum, RV mildly dilated, PASP 62 mmHg.   2. 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 Baseline is 2.5 but has fluctuated in the past.  He has required emergent hemodialysis before- no indications for now. Likely due to DM/HTN with (+)proteinuria. AKI likely due to hemodynamic fluctuations with diuresis.  For now, will continue Lasix 120 IV BID and hopefully transition to oral diuretics in the next 24-48 hours.  He is followed by Dr. Jimmy Footman as an outpatient and was previously on torsemide 60 mg BID.      3. HTN. BP control excellent- avoid overcontrol as diuresis continues  4. DM. Per primary  5.  COPD: home O2 of 2-3 L.  More confused today per wife. Didn't wear NIPPV last night.  Rec encourage to do so and even consider blood gas to assess for acidemia and hypercarbia (VBG would be sufficient for our purposes).   6. Metabolic alkalosis.  Will give single dose of acetazolamide 250 mg today.  I suspect multifactorial with diuresis and possible CO2 retention from COPD.  As above in #5.    7.  Dispo: in stepdown.  Pall care was consulted previously during hospitalization- appreciate assistance.  Subjective:    Seen in room.  Wife at bedside.  Sleepier than normal she reports- didn't wear CPAP last night.  Urine output robust.  CO2 noted.     Objective:   BP (!) 144/26 (BP Location: Left Arm)   Pulse 68   Temp 97.9 F (36.6 C) (Oral)   Resp (!) 26   Ht 5\' 9"  (1.753 m)   Wt 98.3 kg   SpO2 98%   BMI 32.00 kg/m   Intake/Output Summary (Last 24  hours) at 02/11/2018 1428 Last data filed at 02/11/2018 1249 Gross per 24 hour  Intake 542 ml  Output 2675 ml  Net -2133 ml   Weight change: -0.6 kg  Physical Exam: Gen: older gentleman, NAD, sleeping but easily arousable CVS: irregular, soft systolic murmur Resp: muffled bases, otherwise clear Abd: + distended, NABS Ext:1+ LE edema  Imaging: Nm Tumor Localization W Spect  Result Date: 02/11/2018 CLINICAL DATA:  HEART FAILURE. CONCERN FOR CARDIAC AMYLOIDOSIS. 78 year old male. Acute on chronic diastolic congestive heart failure. EXAM: NUCLEAR MEDICINE TUMOR LOCALIZATION. PYP CARDIAC AMYLOIDOSIS SCAN WITH SPECT TECHNIQUE: Following intravenous administration of radiopharmaceutical, anterior planar images of the chest were obtained. Regions of interest were placed on the heart and contralateral chest wall for quantitative assessment. Additional SPECT imaging of the chest was obtained. RADIOPHARMACEUTICALS:  Stone mCi TECHNETIUM 99 PYROPHOSPHATE FINDINGS: Planar Visual assessment: Anterior planar imaging demonstrates radiotracer uptake within the heart less than uptake within the adjacent ribs (Grade 1). Quantitative assessment : Quantitative assessment of the cardiac uptake compared to the contralateral chest wall is equal to 1.1 (H/CL = 1.1). SPECT assessment: SPECT imaging of the chest demonstrates minimal radiotracer accumulation within the LEFT ventricle. 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 Electronically Signed  By: Suzy Bouchard M.D.   On: 02/11/2018 07:53    Labs: BMET Recent Labs  Lab 02/05/18 0243 02/06/18 0254 02/07/18 0351 02/08/18 0811 02/09/18 0541 02/10/18 0234 02/11/18 0237  NA 142 142 141 142 142 139 138  K 4.1 4.6 4.0 4.1 4.0 3.7 3.8  CL 92* 93* 87* 89* 88* 88* 85*  CO2 40* 39* 42* 41* 40* 44* 41*  GLUCOSE 91 99 103* 113* 100* 93 101*  BUN 65* 75* 72* 77* 78* 81* 83*  CREATININE  2.46* 2.78* 2.90* 2.94* 3.24* 3.10* 3.39*  CALCIUM 9.6 9.6 9.8 9.7 9.7 9.7 10.0  PHOS  --   --   --   --   --  3.9  --    CBC Recent Labs  Lab 02/08/18 0225 02/09/18 0541 02/10/18 0234 02/11/18 0237  WBC 12.5* 8.3 5.3 4.8  NEUTROABS  --  7.0 3.9 3.3  HGB 11.7* 11.3* 10.6* 10.8*  HCT 38.9* 39.9 36.4* 36.8*  MCV 108.4* 110.2* 109.3* 107.3*  PLT 122* 144* 151 161    Medications:    . acetaZOLAMIDE  250 mg Oral Once  . allopurinol  300 mg Oral Daily  . bisoprolol  5 mg Oral Daily  . calcitRIOL  0.25 mcg Oral QODAY  . calcitRIOL  0.5 mcg Oral QODAY  . haloperidol lactate  1 mg Intravenous Once  . Influenza vac split quadrivalent PF  0.5 mL Intramuscular Tomorrow-1000  . isosorbide mononitrate  30 mg Oral Daily  . levothyroxine  175 mcg Oral QAC breakfast  . mouth rinse  15 mL Mouth Rinse BID  . multivitamin with minerals  1 tablet Oral Daily  . pantoprazole  40 mg Oral Daily  . polyethylene glycol  17 g Oral Daily  . potassium chloride SA  20 mEq Oral Daily  . rosuvastatin  20 mg Oral Daily  . senna-docusate  1 tablet Oral BID  . sodium chloride flush  3 mL Intravenous Q12H  . umeclidinium-vilanterol  1 puff Inhalation Daily  . warfarin  7.5 mg Oral ONCE-1800  . Warfarin - Pharmacist Dosing Inpatient   Does not apply q1800      Madelon Lips, MD 02/11/2018, 2:28 PM

## 2018-02-11 NOTE — Progress Notes (Signed)
Pt ambulated 200 feet in hallway with rollator on 2L nasal cannula. Tolerated fair.   Fritz Pickerel, RN

## 2018-02-11 NOTE — Progress Notes (Signed)
PROGRESS NOTE    Omar Lambert  CMK:[id number] DOB: 1940-11-24 DOA: 01/31/2018 PCP: Gayland Curry, DO    Brief Narrative: Omar Lambert is a 78 y.o. male with medical history significant of DM2, Hypothyroidism, HTN, Gout, GERD, COPD, CKD, diastolic CHF, asthma who presents for SOB and swelling. Pt admitted for acute on chronic diastolic heart failure.    Assessment & Plan:   Active Problems:   Benign essential hypertension   COPD exacerbation (HCC)   Esophageal reflux   Chronic kidney disease (CKD) stage G3b/A2, moderately decreased glomerular filtration rate (GFR) between 30-44 mL/min/1.73 square meter and albuminuria creatinine ratio between 30-299 mg/g (HCC)   Persistent atrial fibrillation   Hypothyroidism   Diabetes mellitus with renal complications (HCC)   Shortness of breath   Goals of care, counseling/discussion   Acute on chronic congestive heart failure (HCC)   Acute on chronic diastolic (congestive) heart failure ([place])   Palliative care by specialist   Acute on chronic diastolic heart failure Admitted to stepdown .  Probably secondary to noncompliance to fluid and salt restriction.He was initially started on IV lasix 120 mg TID , later decreased it to BID.  Continue with strict intake and output and daily weights and monitor renal parameters while on IV Lasix.    Repeat echocardiogram showed Wall thickness wasincreased in a pattern of severe LVH. Systolic function was normal. The estimated ejection fraction was in the range of 50%  to 55%. Wall motion was normal; there were no regional wall  motion abnormalities. The study was not technically sufficient to allow evaluation of LV diastolic dysfunction due to atrial fibrillation. Cardiology consulted. Underwent RHC, showing elevated right filling pressures, with sig RV failure.  Use BiPAP as needed and nasal cannula oxygen to keep sats greater than 90%. He has diuresed about 15.6 lit of since admission, lost about 20  lbs.  Suspicion for cardiac amyloidosis and a PYP scan ordered.  Myeloma panel is pending.  Appreciate cardiology recommendations.   Elevated troponins probably from demand ischemia from acute CHF.    Essential hypertension Well-controlled Continue home medication    Mild acute on Stage III chronic kidney disease Watch renal parameters while on IV diuretics. Creatinine increased to 3.4 > 3.3 today.  post cath .  Nephrology consulted for recommendations.   Persistent atrial fibrillation Rate controlled Back on coumadin. INR sub therapeutic.     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     Hyperlipidemia Continue with Crestor  Patient slipped from the chair :  Patient denies any new complaints but he appears confused and lethargic. CT head without contrast does not show any acute abnormality ABG shows hypercapnia would recommend compliance to CPAP every day.    Acute metabolic encephalopathy probably from hypercapnia and acute kidney injury from diuresis Continue to monitor.  Due to poor prognosis and multiple comorbidities and poor functional status palliative care consulted for goals of care.  Discussions ongoing   DVT prophylaxis: Heparin Code Status: full code.  Family Communication: Wife at bedside Disposition Plan:pending clinical improvement and cardiology recommendations.   Consultants:  Cardiology. Nephrology Dr. Johnney Ou Palliative care   Procedures: Echocardiogram.      Antimicrobials: none.    Subjective: Pt non compliant to CPAP at night.  He is confused, lethargic.  Discussed with wife at bedside , stressed on compliance with CPAP.  Objective: Vitals:   02/11/18 0600 02/11/18 0700 02/11/18 0715 02/11/18 0745  BP:    105/65  Pulse: 66 (!) 57 (!) 59   Resp: 15 (!) 26 18 (!) 23  Temp:    97.9 F (36.6 C)  TempSrc:    Oral  SpO2: 98% 97% 96% 100%  Weight:      Height:        Intake/Output Summary (Last 24  hours) at 02/11/2018 0820 Last data filed at 02/11/2018 0352 Gross per 24 hour  Intake 368.85 ml  Output 2575 ml  Net -2206.15 ml   Filed Weights   02/09/18 0448 02/10/18 0512 02/11/18 0348  Weight: 98.2 kg 98.9 kg 98.3 kg    Examination:  General exam: Lethargic, not in any kind of distress, on 2lit .  Respiratory system: air entry diminished at bases, no wheezing or rhonchi.  Cardiovascular system: S1 & S2 heard, irregular, JVD present.  Gastrointestinal system: Abdomen is  Soft, NT ND BS+ Central nervous system: lethargic, thinks he is at home. Able to move all his extremites.  Extremities:able to move all extremities. Pedal edema improving.  Skin: No rashes, lesions or ulcers Psychiatry:  Mood & affect appropriate.     Data Reviewed: I have personally reviewed following labs and imaging studies  CBC: Recent Labs  Lab 02/07/18 0351 02/08/18 0225 02/09/18 0541 02/10/18 0234 02/11/18 0237  WBC 9.9 12.5* 8.3 5.3 4.8  NEUTROABS  --   --  7.0 3.9 3.3  HGB 11.0* 11.7* 11.3* 10.6* 10.8*  HCT 36.9* 38.9* 39.9 36.4* 36.8*  MCV 109.5* 108.4* 110.2* 109.3* 107.3*  PLT 153 122* 144* 151 573   Basic Metabolic Panel: Recent Labs  Lab 02/05/18 0243  02/07/18 0351 02/08/18 0811 02/09/18 0541 02/10/18 0234 02/11/18 0237  NA 142   < > 141 142 142 139 138  K 4.1   < > 4.0 4.1 4.0 3.7 3.8  CL 92*   < > 87* 89* 88* 88* 85*  CO2 40*   < > 42* 41* 40* 44* 41*  GLUCOSE 91   < > 103* 113* 100* 93 101*  BUN 65*   < > 72* 77* 78* 81* 83*  CREATININE 2.46*   < > 2.90* 2.94* 3.24* 3.10* 3.39*  CALCIUM 9.6   < > 9.8 9.7 9.7 9.7 10.0  MG 2.6*  --   --   --   --   --   --   PHOS  --   --   --   --   --  3.9  --    < > = values in this interval not displayed.   GFR: Estimated Creatinine Clearance: 20.8 mL/min (A) (by C-G formula based on SCr of 3.39 mg/dL (H)). Liver Function Tests: No results for input(s): AST, ALT, ALKPHOS, BILITOT, PROT, ALBUMIN in the last 168 hours. No results  for input(s): LIPASE, AMYLASE in the last 168 hours. No results for input(s): AMMONIA in the last 168 hours. Coagulation Profile: Recent Labs  Lab 02/07/18 0351 02/08/18 0225 02/09/18 0541 02/10/18 0234 02/11/18 0237  INR 1.92 1.72 1.69 1.80 1.92   Cardiac Enzymes: No results for input(s): CKTOTAL, CKMB, CKMBINDEX, TROPONINI in the last 168 hours. BNP (last 3 results) No results for input(s): PROBNP in the last 8760 hours. HbA1C: No results for input(s): HGBA1C in the last 72 hours. CBG: Recent Labs  Lab 02/05/18 0838 02/05/18 1137 02/05/18 1633 02/05/18 2127 02/07/18 0747  GLUCAP 84 92 114* 97 88   Lipid Profile: No results for input(s): CHOL, HDL, LDLCALC, TRIG, CHOLHDL, LDLDIRECT in the last 72  hours. Thyroid Function Tests: No results for input(s): TSH, T4TOTAL, FREET4, T3FREE, THYROIDAB in the last 72 hours. Anemia Panel: No results for input(s): VITAMINB12, FOLATE, FERRITIN, TIBC, IRON, RETICCTPCT in the last 72 hours. Sepsis Labs: No results for input(s): PROCALCITON, LATICACIDVEN in the last 168 hours.  No results found for this or any previous visit (from the past 240 hour(s)).       Radiology Studies: Nm Tumor Localization W Spect  Result Date: 02/11/2018 CLINICAL DATA:  HEART FAILURE. CONCERN FOR CARDIAC AMYLOIDOSIS. 78 year old male. Acute on chronic diastolic congestive heart failure. EXAM: NUCLEAR MEDICINE TUMOR LOCALIZATION. PYP CARDIAC AMYLOIDOSIS SCAN WITH SPECT TECHNIQUE: Following intravenous administration of radiopharmaceutical, anterior planar images of the chest were obtained. Regions of interest were placed on the heart and contralateral chest wall for quantitative assessment. Additional SPECT imaging of the chest was obtained. RADIOPHARMACEUTICALS:  Stone mCi TECHNETIUM 99 PYROPHOSPHATE FINDINGS: Planar Visual assessment: Anterior planar imaging demonstrates radiotracer uptake within the heart less than uptake within the adjacent ribs (Grade 1).  Quantitative assessment : Quantitative assessment of the cardiac uptake compared to the contralateral chest wall is equal to 1.1 (H/CL = 1.1). SPECT assessment: SPECT imaging of the chest demonstrates minimal radiotracer accumulation within the LEFT ventricle. 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 Electronically Signed   By: Suzy Bouchard M.D.   On: 02/11/2018 07:53        Scheduled Meds: . allopurinol  300 mg Oral Daily  . bisoprolol  5 mg Oral Daily  . calcitRIOL  0.25 mcg Oral QODAY  . calcitRIOL  0.5 mcg Oral QODAY  . haloperidol lactate  1 mg Intravenous Once  . Influenza vac split quadrivalent PF  0.5 mL Intramuscular Tomorrow-1000  . isosorbide mononitrate  30 mg Oral Daily  . levothyroxine  175 mcg Oral QAC breakfast  . mouth rinse  15 mL Mouth Rinse BID  . multivitamin with minerals  1 tablet Oral Daily  . pantoprazole  40 mg Oral Daily  . polyethylene glycol  17 g Oral Daily  . potassium chloride SA  20 mEq Oral Daily  . rosuvastatin  20 mg Oral Daily  . senna-docusate  1 tablet Oral BID  . sodium chloride flush  3 mL Intravenous Q12H  . umeclidinium-vilanterol  1 puff Inhalation Daily  . Warfarin - Pharmacist Dosing Inpatient   Does not apply q1800   Continuous Infusions: . sodium chloride Stopped (02/04/18 0004)  . furosemide 120 mg (02/11/18 0751)     LOS: 11 days    Time spent 28 minutes.     Hosie Poisson, MD [facility] Pager [phone]  If 7PM-7AM, please contact night-coverage [email or web address] Password [email or web address] 02/11/2018, 8:20 AM

## 2018-02-11 NOTE — Care Management Important Message (Signed)
Important Message  Patient Details  Name: Omar Lambert MRN: [id number] Date of Birth: 03/26/1940   Medicare Important Message Given:  Yes    Juanita Devincent P Hawthorne 02/11/2018, 2:19 PM

## 2018-02-11 NOTE — Progress Notes (Signed)
ANTICOAGULATION CONSULT NOTE - Follow up [place] for coumadin Indication: atrial fibrillation  No Known Allergies  Patient Measurements: Height: 5\' 9"  (175.3 cm) Weight: 216 lb 11.4 oz (98.3 kg) IBW/kg (Calculated) : 70.7  Vital Signs: Temp: 97.9 F (36.6 C) (01/07 1146) Temp Source: Oral (01/07 1146) BP: 144/26 (01/07 1146) Pulse Rate: 68 (01/07 1146)  Labs: Recent Labs    02/09/18 0541 02/10/18 0234 02/11/18 0237  HGB 11.3* 10.6* 10.8*  HCT 39.9 36.4* 36.8*  PLT 144* 151 161  LABPROT 19.7* 20.6* 21.8*  INR 1.69 1.80 1.92  CREATININE 3.24* 3.10* 3.39*    Estimated Creatinine Clearance: 20.8 mL/min (A) (by C-G formula based on SCr of 3.39 mg/dL (H)).   Medical History: Past Medical History:  Diagnosis Date  . Acute kidney injury superimposed on chronic kidney disease ([place]) 11/2016  . Acute respiratory failure ([place]) 11/2016  . Allergy   . Asthma   . Atrial fibrillation ([place])   . 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 Benign essential hypertension   . CHF (congestive heart failure) ([place])   . CKD (chronic kidney disease), stage III ([place])   . COPD (chronic obstructive pulmonary disease) ([place])   . Diabetes ([place])   . Diabetes mellitus without complication ([place])   . Dyspnea   . Gastritis   . GERD (gastroesophageal reflux disease)   . Gout attack 09/2012  . Hypertension   . Sinusitis, maxillary, chronic   . Thyroid disease   . Type II or unspecified type diabetes mellitus without mention of complication, not stated as uncontrolled     Assessment: 79 yoM admitted with SOB and swelling. Pt on warfarin at home for AFib.   INR still below goal today but trending up to 1.9, gave boosted dose last night, will repeat tonight. No bleeding issues noted.   Home dose: 2.5mg  daily except for 5mg  on Mon/Fri  Goal of Therapy:  INR 2-3 Monitor platelets by anticoagulation protocol: Yes   Plan:  -Repeat coumadin 7.5 mg today. Follow  INR closely given higher than normal dose -Daily PT/INR  Erin Hearing PharmD., BCPS Clinical Pharmacist 02/11/2018 1:19 PM

## 2018-02-11 NOTE — Progress Notes (Signed)
Patient refuse NIV for the night. Pt is stable at this time no distress or complications noted. NIV machine is at bedside in case patient changes his mind.

## 2018-02-12 LAB — CBC WITH DIFFERENTIAL/PLATELET
Abs Immature Granulocytes: 0.02 10*3/uL (ref 0.00–0.07)
Basophils Absolute: 0 10*3/uL (ref 0.0–0.1)
Basophils Relative: 1 %
EOS ABS: 0.1 10*3/uL (ref 0.0–0.5)
Eosinophils Relative: 2 %
HCT: 36.9 % — ABNORMAL LOW (ref 39.0–52.0)
Hemoglobin: 11 g/dL — ABNORMAL LOW (ref 13.0–17.0)
Immature Granulocytes: 0 %
Lymphocytes Relative: 16 %
Lymphs Abs: 0.9 10*3/uL (ref 0.7–4.0)
MCH: 31.5 pg (ref 26.0–34.0)
MCHC: 29.8 g/dL — ABNORMAL LOW (ref 30.0–36.0)
MCV: 105.7 fL — ABNORMAL HIGH (ref 80.0–100.0)
Monocytes Absolute: 0.7 10*3/uL (ref 0.1–1.0)
Monocytes Relative: 14 %
NEUTROS PCT: 67 %
Neutro Abs: 3.6 10*3/uL (ref 1.7–7.7)
Platelets: 148 10*3/uL — ABNORMAL LOW (ref 150–400)
RBC: 3.49 MIL/uL — ABNORMAL LOW (ref 4.22–5.81)
RDW: 17 % — AB (ref 11.5–15.5)
WBC: 5.3 10*3/uL (ref 4.0–10.5)
nRBC: 0 % (ref 0.0–0.2)

## 2018-02-12 LAB — BASIC METABOLIC PANEL
Anion gap: 15 (ref 5–15)
BUN: 79 mg/dL — ABNORMAL HIGH (ref 8–23)
CO2: 38 mmol/L — ABNORMAL HIGH (ref 22–32)
Calcium: 9.9 mg/dL (ref 8.9–10.3)
Chloride: 85 mmol/L — ABNORMAL LOW (ref 98–111)
Creatinine, Ser: 3.82 mg/dL — ABNORMAL HIGH (ref 0.61–1.24)
GFR calc Af Amer: 16 mL/min — ABNORMAL LOW (ref >60)
GFR, EST NON AFRICAN AMERICAN: 14 mL/min — AB (ref >60)
Glucose, Bld: 91 mg/dL (ref 70–99)
Potassium: 3.6 mmol/L (ref 3.5–5.1)
Sodium: 138 mmol/L (ref 135–145)

## 2018-02-12 LAB — PROTIME-INR
INR: 1.97
Prothrombin Time: 22.1 seconds — ABNORMAL HIGH (ref 11.4–15.2)

## 2018-02-12 MED ORDER — WARFARIN SODIUM 10 MG PO TABS
10.0000 mg | ORAL_TABLET | Freq: Once | ORAL | Status: AC
  Administered 2018-02-12: 10 mg via ORAL
  Filled 2018-02-12: qty 1

## 2018-02-12 MED ORDER — TORSEMIDE 20 MG PO TABS
60.0000 mg | ORAL_TABLET | Freq: Two times a day (BID) | ORAL | Status: DC
  Administered 2018-02-13 – 2018-02-14 (×3): 60 mg via ORAL
  Filled 2018-02-12 (×3): qty 3

## 2018-02-12 MED ORDER — HALOPERIDOL LACTATE 5 MG/ML IJ SOLN
1.0000 mg | Freq: Once | INTRAMUSCULAR | Status: AC
  Administered 2018-02-12: 1 mg via INTRAVENOUS
  Filled 2018-02-12: qty 1

## 2018-02-12 NOTE — Progress Notes (Signed)
[facility] Progress Note    Assessment/ Plan:   1. Acute diastolic CHF. On Lasix 120 IV BID- continue for now.  Net neg 7.7 L since admission.  Renal function (as expected) is fluctuating.  Continues to be fluid overloaded.  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    RHC (1/3):  Mean RA 22 PA 49/21 Mean PCWP 20 CI 2.4  Echo: EF 50-55%, severe LVH, d-shaped septum, RV mildly dilated, PASP 62 mmHg.   2. 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 Baseline is 2.5 but has fluctuated in the past.  He has required emergent hemodialysis before- no indications for now. Chronic component due to DM/HTN with (+)proteinuria. AKI likely due to hemodynamic fluctuations with diuresis.  - Let's hold the evening IV diuretic and then transition to torsemide 60 mg BID starting tomorrow  3. HTN. BP control excellent- avoid overcontrol as diuresis continues  4. DM. Per primary  5. COPD: home O2 of 2-3 L.    6. Metabolic alkalosis.Given single dose of acetazolamide 250 mg 1/7. Will monitor for now  7.  Dispo: in stepdown.  Pall care was consulted previously during hospitalization- appreciate assistance.  Subjective:   Breathing is better and denies f/c/n/v. Very good appetite.  Spouse bedside.   Objective:   BP 98/64 (BP Location: Left Arm)   Pulse 63   Temp 97.8 F (36.6 C) (Oral)   Resp (!) 32   Ht 5\' 9"  (1.753 m)   Wt 98.3 kg   SpO2 97%   BMI 32.00 kg/m   Intake/Output Summary (Last 24 hours) at 02/12/2018 1313 Last data filed at 02/12/2018 1250 Gross per 24 hour  Intake 752 ml  Output 1525 ml  Net -773 ml   Weight change:   Physical Exam: Gen: older gentleman, NAD, supine and comfortable CVS: irregular, soft systolic murmur Resp: distant BS, rales at bases Abd: + distended, NABS Ext:1+ LE edema  Imaging: Nm Tumor Localization W Spect  Result Date: 02/11/2018 CLINICAL DATA:  HEART  FAILURE. CONCERN FOR CARDIAC AMYLOIDOSIS. 78 year old male. Acute on chronic diastolic congestive heart failure. EXAM: NUCLEAR MEDICINE TUMOR LOCALIZATION. PYP CARDIAC AMYLOIDOSIS SCAN WITH SPECT TECHNIQUE: Following intravenous administration of radiopharmaceutical, anterior planar images of the chest were obtained. Regions of interest were placed on the heart and contralateral chest wall for quantitative assessment. Additional SPECT imaging of the chest was obtained. RADIOPHARMACEUTICALS:  Stone mCi TECHNETIUM 99 PYROPHOSPHATE FINDINGS: Planar Visual assessment: Anterior planar imaging demonstrates radiotracer uptake within the heart less than uptake within the adjacent ribs (Grade 1). Quantitative assessment : Quantitative assessment of the cardiac uptake compared to the contralateral chest wall is equal to 1.1 (H/CL = 1.1). SPECT assessment: SPECT imaging of the chest demonstrates minimal radiotracer accumulation within the LEFT ventricle. 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 Electronically Signed   By: Suzy Bouchard M.D.   On: 02/11/2018 07:53    Labs: BMET Recent Labs  Lab 02/06/18 0254 02/07/18 0351 02/08/18 0811 02/09/18 0541 02/10/18 0234 02/11/18 0237 02/12/18 0228  NA 142 141 142 142 139 138 138  K 4.6 4.0 4.1 4.0 3.7 3.8 3.6  CL 93* 87* 89* 88* 88* 85* 85*  CO2 39* 42* 41* 40* 44* 41* 38*  GLUCOSE 99 103* 113* 100* 93 101* 91  BUN 75* 72* 77* 78* 81* 83* 79*  CREATININE 2.78* 2.90* 2.94* 3.24* 3.10*  3.39* 3.82*  CALCIUM 9.6 9.8 9.7 9.7 9.7 10.0 9.9  PHOS  --   --   --   --  3.9  --   --    CBC Recent Labs  Lab 02/09/18 0541 02/10/18 0234 02/11/18 0237 02/12/18 0228  WBC 8.3 5.3 4.8 5.3  NEUTROABS 7.0 3.9 3.3 3.6  HGB 11.3* 10.6* 10.8* 11.0*  HCT 39.9 36.4* 36.8* 36.9*  MCV 110.2* 109.3* 107.3* 105.7*  PLT 144* 151 161 148*    Medications:    . allopurinol  300 mg Oral Daily   . bisoprolol  5 mg Oral Daily  . calcitRIOL  0.25 mcg Oral QODAY  . calcitRIOL  0.5 mcg Oral QODAY  . haloperidol lactate  1 mg Intravenous Once  . Influenza vac split quadrivalent PF  0.5 mL Intramuscular Tomorrow-1000  . isosorbide mononitrate  30 mg Oral Daily  . levothyroxine  175 mcg Oral QAC breakfast  . mouth rinse  15 mL Mouth Rinse BID  . multivitamin with minerals  1 tablet Oral Daily  . pantoprazole  40 mg Oral Daily  . polyethylene glycol  17 g Oral Daily  . potassium chloride SA  20 mEq Oral Daily  . rosuvastatin  20 mg Oral Daily  . senna-docusate  1 tablet Oral BID  . sodium chloride flush  3 mL Intravenous Q12H  . umeclidinium-vilanterol  1 puff Inhalation Daily  . Warfarin - Pharmacist Dosing Inpatient   Does not apply q1800      Otelia Santee, MD 02/12/2018, 1:13 PM

## 2018-02-12 NOTE — Progress Notes (Signed)
Physical Therapy Treatment Patient Details Name: Omar Lambert MRN: [id number] DOB: [birth year] Today's Date: 02/12/2018    History of Present Illness Cartel Omar Wilsonis a 78 y.o.malewith medical history significant ofDM2, Hypothyroidism, HTN, Gout, GERD, COPD, CKD, diastolic CHF, asthma who presents for SOB and swelling. Pt admitted for acute on chronic diastolic heart failure. Pt developed acute metabolic encephalopathy. Pt also with acute kidney injury superimposed on CKD.     PT Comments    Pt with fatigue and lethargy this session, so gait distance limited. Required min guard A with use of RW and chair follow for mobility tasks this session. Pt seemed slower to respond to cues and questions this session, however, RN reports likely secondary to medications. Wife reports she plans for pt to return home and she can help pt as needed with mobility tasks. Current recommendations appropriate. Will continue to follow acutely to maximize functional mobility independence and safety.    Follow Up Recommendations  Home health PT;Supervision for mobility/OOB     Equipment Recommendations  Other (comment)(rollator)    Recommendations for Other Services       Precautions / Restrictions Precautions Precautions: Fall Precaution Comments: watch 02 saturations Restrictions Weight Bearing Restrictions: No    Mobility  Bed Mobility Overal bed mobility: Needs Assistance Bed Mobility: Supine to Sit     Supine to sit: Min assist     General bed mobility comments: Min A for trunk elevation.   Transfers Overall transfer level: Needs assistance Equipment used: Rolling walker (2 wheeled) Transfers: Sit to/from Stand Sit to Stand: Min guard         General transfer comment: Min guard for safety. Increased time and effort required and cues for safe hand placement.   Ambulation/Gait Ambulation/Gait assistance: Min guard;+2 safety/equipment(chair follow ) Gait Distance (Feet): 100  Feet Assistive device: Rolling walker (2 wheeled) Gait Pattern/deviations: Step-through pattern;Decreased stride length;Drifts right/left Gait velocity: decreased   General Gait Details: Distance limited secondary to fatigue this session. Oxygen sats WFL on 2L of oxygen. Min guard for steadying assist. Required chair follow secondary to fatigue/lethargy this session.    Stairs             Wheelchair Mobility    Modified Rankin (Stroke Patients Only)       Balance Overall balance assessment: Needs assistance Sitting-balance support: No upper extremity supported;Feet supported Sitting balance-Leahy Scale: Good     Standing balance support: Bilateral upper extremity supported;During functional activity Standing balance-Leahy Scale: Poor Standing balance comment: Reliant on BUE support                             Cognition Arousal/Alertness: Lethargic Behavior During Therapy: Flat affect Overall Cognitive Status: Impaired/Different from baseline Area of Impairment: Safety/judgement;Memory                     Memory: Decreased short-term memory   Safety/Judgement: Decreased awareness of safety;Decreased awareness of deficits     General Comments: Pt would fall asleep easily in supine and seemed to have flat affect. Required increased time to answer questions and follow commands.       Exercises      General Comments General comments (skin integrity, edema, etc.): Pt's wife present throughout session.       Pertinent Vitals/Pain Pain Assessment: No/denies pain    Home Living  Prior Function            PT Goals (current goals can now be found in the care plan section) Acute Rehab PT Goals Patient Stated Goal: to go home PT Goal Formulation: With patient/family Time For Goal Achievement: 02/15/18 Potential to Achieve Goals: Good Progress towards PT goals: Progressing toward goals    Frequency    Min  3X/week      PT Plan Current plan remains appropriate;Frequency needs to be updated    Co-evaluation              AM-PAC PT "6 Clicks" Mobility   Outcome Measure  Help needed turning from your back to your side while in a flat bed without using bedrails?: None Help needed moving from lying on your back to sitting on the side of a flat bed without using bedrails?: A Little Help needed moving to and from a bed to a chair (including a wheelchair)?: A Little Help needed standing up from a chair using your arms (e.g., wheelchair or bedside chair)?: A Little Help needed to walk in hospital room?: A Little Help needed climbing 3-5 steps with a railing? : A Lot 6 Click Score: 18    End of Session Equipment Utilized During Treatment: Gait belt;Oxygen Activity Tolerance: Patient limited by fatigue;Patient limited by lethargy Patient left: in chair;with call bell/phone within reach;with chair alarm set;with family/visitor present Nurse Communication: Mobility status;Other (comment)(condom cath needs to be replaced) PT Visit Diagnosis: Difficulty in walking, not elsewhere classified (R26.2)     Time: 0211-1735 PT Time Calculation (min) (ACUTE ONLY): 29 min  Charges:  $Gait Training: 23-37 mins                     Leighton Ruff, PT, DPT  Acute Rehabilitation Services  Pager: [phone] Office: [phone]    Rudean Hitt 02/12/2018, 3:51 PM

## 2018-02-12 NOTE — Plan of Care (Signed)
  Problem: Clinical Measurements: Goal: Ability to maintain clinical measurements within normal limits will improve Outcome: Progressing Goal: Will remain free from infection Outcome: Progressing Goal: Diagnostic test results will improve Outcome: Progressing Goal: Respiratory complications will improve Outcome: Progressing Goal: Cardiovascular complication will be avoided Outcome: Progressing   Problem: Activity: Goal: Risk for activity intolerance will decrease Outcome: Progressing   Problem: Elimination: Goal: Will not experience complications related to bowel motility Outcome: Progressing Goal: Will not experience complications related to urinary retention Outcome: Progressing   Problem: Safety: Goal: Ability to remain free from injury will improve Outcome: Progressing   Problem: Skin Integrity: Goal: Risk for impaired skin integrity will decrease Outcome: Progressing   Problem: Education: Goal: Ability to demonstrate management of disease process will improve Outcome: Progressing Goal: Individualized Educational Video(s) Outcome: Progressing   Problem: Activity: Goal: Capacity to carry out activities will improve Outcome: Progressing   Problem: Cardiac: Goal: Ability to achieve and maintain adequate cardiopulmonary perfusion will improve Outcome: Progressing

## 2018-02-12 NOTE — Progress Notes (Signed)
Pt received Haldol last night. He refuses CPap and continues to want to dangle on side of bed for optimal ventilation. RN attempting to keep Pt awake and active during day, strict fluid intake adhered to and education/encouragement provided to wife.

## 2018-02-12 NOTE — Progress Notes (Signed)
PT Cancellation Note  Patient Details Name: KASHAWN DIRR MRN: [id number] DOB: 1940-10-29   Cancelled Treatment:    Reason Eval/Treat Not Completed: Fatigue/lethargy limiting ability to participate Pt with increased lethargy secondary to medications and RN requesting to attempt later in afternoon. Will follow up as schedule allows.   Leighton Ruff, PT, DPT  Acute Rehabilitation Services  Pager: [phone] Office: [phone]  Rudean Hitt 02/12/2018, 10:37 AM

## 2018-02-12 NOTE — Progress Notes (Signed)
RT Note:  Asked patient if he was ready to be put on CPAP, patient responded that he did not want to be put on right now.  I informed him to let RN know when he was ready and I also informed the RN of his refusal.  Will continue to monitor.

## 2018-02-12 NOTE — Progress Notes (Signed)
ANTICOAGULATION CONSULT NOTE - Follow up Omar Lambert for coumadin Indication: atrial fibrillation  No Known Allergies  Patient Measurements: Height: 5\' 9"  (175.3 cm) Weight: 216 lb 11.4 oz (98.3 kg) IBW/kg (Calculated) : 70.7  Vital Signs: Temp: 97.8 F (36.6 C) (01/08 1157) Temp Source: Oral (01/08 1157) BP: 98/64 (01/08 1157) Pulse Rate: 63 (01/08 1157)  Labs: Recent Labs    02/10/18 0234 02/11/18 0237 02/12/18 0228  HGB 10.6* 10.8* 11.0*  HCT 36.4* 36.8* 36.9*  PLT 151 161 148*  LABPROT 20.6* 21.8* 22.1*  INR 1.80 1.92 1.97  CREATININE 3.10* 3.39* 3.82*    Estimated Creatinine Clearance: 18.4 mL/min (A) (by C-G formula based on SCr of 3.82 mg/dL (H)).   Medical History: Past Medical History:  Diagnosis Date  . Acute kidney injury superimposed on chronic kidney disease ([place]) 11/2016  . Acute respiratory failure ([place]) 11/2016  . Allergy   . Asthma   . Atrial fibrillation ([place])   . 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 Benign essential hypertension   . CHF (congestive heart failure) ([place])   . CKD (chronic kidney disease), stage III ([place])   . COPD (chronic obstructive pulmonary disease) ([place])   . Diabetes ([place])   . Diabetes mellitus without complication ([place])   . Dyspnea   . Gastritis   . GERD (gastroesophageal reflux disease)   . Gout attack 09/2012  . Hypertension   . Sinusitis, maxillary, chronic   . Thyroid disease   . Type II or unspecified type diabetes mellitus without mention of complication, not stated as uncontrolled     Assessment: 31 yoM admitted with SOB and swelling. Pt on warfarin at home for AFib.   INR still below goal today, trend is slowing, INR up to 1.97. We have been giving boosted doses with little response over the past few days. Will give higher dose tonight. No bleeding issues noted.   Home dose: 2.5mg  daily except for 5mg  on Mon/Fri  Goal of Therapy:  INR 2-3 Monitor platelets by  anticoagulation protocol: Yes   Plan:  -coumadin 10 mg today. Follow INR closely given higher than normal dose -Daily PT/INR  Erin Hearing PharmD., BCPS Clinical Pharmacist 02/12/2018 1:43 PM

## 2018-02-12 NOTE — Progress Notes (Signed)
PROGRESS NOTE    Omar Lambert  TDH:[id number] DOB: [birth year] DOA: 01/31/2018 PCP: Gayland Curry, DO   Brief Narrative:  78 year old with history of diabetes mellitus type 2, hypothyroidism, gout, essential hypertension, diastolic CHF, asthma, GERD came to the hospital complains of shortness of breath and swelling.  During the hospitalization patient was diuresed but creatinine started to trend up therefore consulted from the cardiology.   Assessment & Plan:   Active Problems:   Benign essential hypertension   COPD exacerbation (HCC)   Esophageal reflux   Chronic kidney disease (CKD) stage G3b/A2, moderately decreased glomerular filtration rate (GFR) between 30-44 mL/min/1.73 square meter and albuminuria creatinine ratio between 30-299 mg/g (HCC)   Persistent atrial fibrillation   Hypothyroidism   Diabetes mellitus with renal complications (HCC)   Shortness of breath   Goals of care, counseling/discussion   Acute on chronic congestive heart failure (HCC)   Acute on chronic diastolic (congestive) heart failure ([place])   Palliative care by specialist  Acute on chronic diastolic congestive heart failure, ejection fraction 50-55%, class III AKI on CKD stage III -Patient currently is on Lasix IV 120 mg 2 times daily.  This will be held today per nephrology. Torsemide 60mg  po bid.  -Echocardiogram showed ejection fraction 50-55% he is following.  Right heart catheterization showed elevated right-sided filling pressures  Persistent atrial fibrillation. -Currently patient is rate controlled, continue Coumadin.  Pharmacy to dose this.  Target INR 2.0-3.0  Diabetes mellitus type 2 - Scale and Accu-Chek  Essential hypertension -Continue bisoprolol, Imdur, torsemide  COPD on chronic condition -Bronchodilators as needed  DVT prophylaxis: Coumadin Code Status: Full code Family Communication: Wife at bedside Disposition Plan: To be determined pending clinical  improvement  Consultants:   Cardiology  Nephrology  Procedures:   Right heart catheterization 1/3  Antimicrobials:   None   Subjective: Better than yesterday in terms of his breathing.  Does not feel back to his baseline  Review of Systems Otherwise negative except as per HPI, including: General: Denies fever, chills, night sweats or unintended weight loss. Resp: Denies cough, wheezing, shortness of breath. Cardiac: Denies chest pain, palpitations, orthopnea, paroxysmal nocturnal dyspnea. GI: Denies abdominal pain, nausea, vomiting, diarrhea or constipation GU: Denies dysuria, frequency, hesitancy or incontinence MS: Denies muscle aches, joint pain or swelling Neuro: Denies headache, neurologic deficits (focal weakness, numbness, tingling), abnormal gait Psych: Denies anxiety, depression, SI/HI/AVH Skin: Denies new rashes or lesions ID: Denies sick contacts, exotic exposures, travel  Objective: Vitals:   02/12/18 0743 02/12/18 0755 02/12/18 0800 02/12/18 1157  BP: 123/79   98/64  Pulse: (!) 58  (!) 56 63  Resp: (!) 21  (!) 21 (!) 32  Temp:  (!) 97.5 F (36.4 C)  97.8 F (36.6 C)  TempSrc:  Oral  Oral  SpO2: 96%  96% 97%  Weight:      Height:        Intake/Output Summary (Last 24 hours) at 02/12/2018 1517 Last data filed at 02/12/2018 1300 Gross per 24 hour  Intake 992 ml  Output 1625 ml  Net -633 ml   Filed Weights   02/09/18 0448 02/10/18 0512 02/11/18 0348  Weight: 98.2 kg 98.9 kg 98.3 kg    Examination:  General exam: Appears calm and comfortable  on 2 L nasal cannula Respiratory system: Breath sounds at the bases Cardiovascular system: S1 & S2 heard, RRR. No JVD, murmurs, rubs, gallops or clicks. No pedal edema. Gastrointestinal system: Abdomen is nondistended, soft and nontender.  No organomegaly or masses felt. Normal bowel sounds heard. Central nervous system: Alert and oriented. No focal neurological deficits. Extremities: Symmetric 5 x 5  power. Skin: No rashes, lesions or ulcers Psychiatry: Judgement and insight appear normal. Mood & affect appropriate.     Data Reviewed:   CBC: Recent Labs  Lab 02/08/18 0225 02/09/18 0541 02/10/18 0234 02/11/18 0237 02/12/18 0228  WBC 12.5* 8.3 5.3 4.8 5.3  NEUTROABS  --  7.0 3.9 3.3 3.6  HGB 11.7* 11.3* 10.6* 10.8* 11.0*  HCT 38.9* 39.9 36.4* 36.8* 36.9*  MCV 108.4* 110.2* 109.3* 107.3* 105.7*  PLT 122* 144* 151 161 892*   Basic Metabolic Panel: Recent Labs  Lab 02/08/18 0811 02/09/18 0541 02/10/18 0234 02/11/18 0237 02/12/18 0228  NA 142 142 139 138 138  K 4.1 4.0 3.7 3.8 3.6  CL 89* 88* 88* 85* 85*  CO2 41* 40* 44* 41* 38*  GLUCOSE 113* 100* 93 101* 91  BUN 77* 78* 81* 83* 79*  CREATININE 2.94* 3.24* 3.10* 3.39* 3.82*  CALCIUM 9.7 9.7 9.7 10.0 9.9  PHOS  --   --  3.9  --   --    GFR: Estimated Creatinine Clearance: 18.4 mL/min (A) (by C-G formula based on SCr of 3.82 mg/dL (H)). Liver Function Tests: No results for input(s): AST, ALT, ALKPHOS, BILITOT, PROT, ALBUMIN in the last 168 hours. No results for input(s): LIPASE, AMYLASE in the last 168 hours. No results for input(s): AMMONIA in the last 168 hours. Coagulation Profile: Recent Labs  Lab 02/08/18 0225 02/09/18 0541 02/10/18 0234 02/11/18 0237 02/12/18 0228  INR 1.72 1.69 1.80 1.92 1.97   Cardiac Enzymes: No results for input(s): CKTOTAL, CKMB, CKMBINDEX, TROPONINI in the last 168 hours. BNP (last 3 results) No results for input(s): PROBNP in the last 8760 hours. HbA1C: No results for input(s): HGBA1C in the last 72 hours. CBG: Recent Labs  Lab 02/05/18 1633 02/05/18 2127 02/07/18 0747  GLUCAP 114* 97 88   Lipid Profile: No results for input(s): CHOL, HDL, LDLCALC, TRIG, CHOLHDL, LDLDIRECT in the last 72 hours. Thyroid Function Tests: No results for input(s): TSH, T4TOTAL, FREET4, T3FREE, THYROIDAB in the last 72 hours. Anemia Panel: No results for input(s): VITAMINB12, FOLATE,  FERRITIN, TIBC, IRON, RETICCTPCT in the last 72 hours. Sepsis Labs: No results for input(s): PROCALCITON, LATICACIDVEN in the last 168 hours.  No results found for this or any previous visit (from the past 240 hour(s)).       Radiology Studies: Nm Tumor Localization W Spect  Result Date: 02/11/2018 CLINICAL DATA:  HEART FAILURE. CONCERN FOR CARDIAC AMYLOIDOSIS. 78 year old male. Acute on chronic diastolic congestive heart failure. EXAM: NUCLEAR MEDICINE TUMOR LOCALIZATION. PYP CARDIAC AMYLOIDOSIS SCAN WITH SPECT TECHNIQUE: Following intravenous administration of radiopharmaceutical, anterior planar images of the chest were obtained. Regions of interest were placed on the heart and contralateral chest wall for quantitative assessment. Additional SPECT imaging of the chest was obtained. RADIOPHARMACEUTICALS:  Stone mCi TECHNETIUM 99 PYROPHOSPHATE FINDINGS: Planar Visual assessment: Anterior planar imaging demonstrates radiotracer uptake within the heart less than uptake within the adjacent ribs (Grade 1). Quantitative assessment : Quantitative assessment of the cardiac uptake compared to the contralateral chest wall is equal to 1.1 (H/CL = 1.1). SPECT assessment: SPECT imaging of the chest demonstrates minimal radiotracer accumulation within the LEFT ventricle. 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 Electronically Signed   By: Suzy Bouchard M.D.   On: 02/11/2018 07:53  Scheduled Meds: . allopurinol  300 mg Oral Daily  . bisoprolol  5 mg Oral Daily  . calcitRIOL  0.25 mcg Oral QODAY  . calcitRIOL  0.5 mcg Oral QODAY  . haloperidol lactate  1 mg Intravenous Once  . Influenza vac split quadrivalent PF  0.5 mL Intramuscular Tomorrow-1000  . isosorbide mononitrate  30 mg Oral Daily  . levothyroxine  175 mcg Oral QAC breakfast  . mouth rinse  15 mL Mouth Rinse BID  . multivitamin with minerals  1 tablet  Oral Daily  . pantoprazole  40 mg Oral Daily  . polyethylene glycol  17 g Oral Daily  . potassium chloride SA  20 mEq Oral Daily  . rosuvastatin  20 mg Oral Daily  . senna-docusate  1 tablet Oral BID  . sodium chloride flush  3 mL Intravenous Q12H  . [START ON 02/13/2018] torsemide  60 mg Oral BID  . umeclidinium-vilanterol  1 puff Inhalation Daily  . warfarin  10 mg Oral ONCE-1800  . Warfarin - Pharmacist Dosing Inpatient   Does not apply q1800   Continuous Infusions: . sodium chloride Stopped (02/04/18 0004)     LOS: 12 days   Time spent= 25 mins    Brannan Cassedy Arsenio Loader, MD [facility]  If 7PM-7AM, please contact night-coverage [email or web address] 02/12/2018, 3:17 PM

## 2018-02-13 ENCOUNTER — Other Ambulatory Visit: Payer: Self-pay

## 2018-02-13 DIAGNOSIS — K219 Gastro-esophageal reflux disease without esophagitis: Secondary | ICD-10-CM

## 2018-02-13 LAB — GLUCOSE, CAPILLARY: GLUCOSE-CAPILLARY: 98 mg/dL (ref 70–99)

## 2018-02-13 LAB — CBC WITH DIFFERENTIAL/PLATELET
Abs Immature Granulocytes: 0.03 10*3/uL (ref 0.00–0.07)
Basophils Absolute: 0 10*3/uL (ref 0.0–0.1)
Basophils Relative: 0 %
Eosinophils Absolute: 0.1 10*3/uL (ref 0.0–0.5)
Eosinophils Relative: 2 %
HCT: 38.8 % — ABNORMAL LOW (ref 39.0–52.0)
Hemoglobin: 11.5 g/dL — ABNORMAL LOW (ref 13.0–17.0)
Immature Granulocytes: 1 %
Lymphocytes Relative: 12 %
Lymphs Abs: 0.7 10*3/uL (ref 0.7–4.0)
MCH: 31.6 pg (ref 26.0–34.0)
MCHC: 29.6 g/dL — ABNORMAL LOW (ref 30.0–36.0)
MCV: 106.6 fL — ABNORMAL HIGH (ref 80.0–100.0)
Monocytes Absolute: 0.7 10*3/uL (ref 0.1–1.0)
Monocytes Relative: 11 %
Neutro Abs: 4.3 10*3/uL (ref 1.7–7.7)
Neutrophils Relative %: 74 %
Platelets: 186 10*3/uL (ref 150–400)
RBC: 3.64 MIL/uL — ABNORMAL LOW (ref 4.22–5.81)
RDW: 16.9 % — ABNORMAL HIGH (ref 11.5–15.5)
WBC: 5.9 10*3/uL (ref 4.0–10.5)
nRBC: 0 % (ref 0.0–0.2)

## 2018-02-13 LAB — BASIC METABOLIC PANEL
Anion gap: 11 (ref 5–15)
BUN: 90 mg/dL — ABNORMAL HIGH (ref 8–23)
CO2: 42 mmol/L — ABNORMAL HIGH (ref 22–32)
Calcium: 10.2 mg/dL (ref 8.9–10.3)
Chloride: 86 mmol/L — ABNORMAL LOW (ref 98–111)
Creatinine, Ser: 4.01 mg/dL — ABNORMAL HIGH (ref 0.61–1.24)
GFR calc Af Amer: 16 mL/min — ABNORMAL LOW (ref >60)
GFR calc non Af Amer: 13 mL/min — ABNORMAL LOW (ref >60)
Glucose, Bld: 98 mg/dL (ref 70–99)
Potassium: 3.9 mmol/L (ref 3.5–5.1)
Sodium: 139 mmol/L (ref 135–145)

## 2018-02-13 LAB — PROTIME-INR
INR: 2.41
Prothrombin Time: 25.9 seconds — ABNORMAL HIGH (ref 11.4–15.2)

## 2018-02-13 LAB — MAGNESIUM: Magnesium: 3.1 mg/dL — ABNORMAL HIGH (ref 1.7–2.4)

## 2018-02-13 MED ORDER — ACETAZOLAMIDE 250 MG PO TABS
250.0000 mg | ORAL_TABLET | Freq: Once | ORAL | Status: AC
  Administered 2018-02-13: 250 mg via ORAL
  Filled 2018-02-13 (×2): qty 1

## 2018-02-13 MED ORDER — WARFARIN SODIUM 5 MG PO TABS
7.0000 mg | ORAL_TABLET | Freq: Once | ORAL | Status: AC
  Administered 2018-02-13: 7 mg via ORAL
  Filled 2018-02-13: qty 1

## 2018-02-13 NOTE — Progress Notes (Signed)
ANTICOAGULATION CONSULT NOTE - Follow up [place] for coumadin Indication: atrial fibrillation  No Known Allergies  Patient Measurements: Height: 5\' 9"  (175.3 cm) Weight: 211 lb 10.3 oz (96 kg) IBW/kg (Calculated) : 70.7  Vital Signs: Temp: 97.7 F (36.5 C) (01/09 0733) Temp Source: Oral (01/09 0733) BP: 124/80 (01/09 0733) Pulse Rate: 72 (01/09 0733)  Labs: Recent Labs    02/11/18 0237 02/12/18 0228 02/13/18 0228  HGB 10.8* 11.0* 11.5*  HCT 36.8* 36.9* 38.8*  PLT 161 148* 186  LABPROT 21.8* 22.1* 25.9*  INR 1.92 1.97 2.41  CREATININE 3.39* 3.82* 4.01*    Estimated Creatinine Clearance: 17.4 mL/min (A) (by C-G formula based on SCr of 4.01 mg/dL (H)).   Medical History: Past Medical History:  Diagnosis Date  . Acute kidney injury superimposed on chronic kidney disease ([place]) 11/2016  . Acute respiratory failure ([place]) 11/2016  . Allergy   . Asthma   . Atrial fibrillation ([place])   . 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 Benign essential hypertension   . CHF (congestive heart failure) ([place])   . CKD (chronic kidney disease), stage III ([place])   . COPD (chronic obstructive pulmonary disease) ([place])   . Diabetes ([place])   . Diabetes mellitus without complication ([place])   . Dyspnea   . Gastritis   . GERD (gastroesophageal reflux disease)   . Gout attack 09/2012  . Hypertension   . Sinusitis, maxillary, chronic   . Thyroid disease   . Type II or unspecified type diabetes mellitus without mention of complication, not stated as uncontrolled     Assessment: 29 yoM admitted with SOB and swelling. Pt on warfarin at home for AFib.   INR now therapeutic at 2.41 following boosted doses with little response over the past few days. Hgb 11.5, plt 186. No s/sx of bleeding documented.    Home dose: 2.5mg  daily except for 5mg  on Mon/Fri  Goal of Therapy:  INR 2-3 Monitor platelets by anticoagulation protocol: Yes   Plan:  -Warfarin 7 mg  today. Plan to decrease to home regimen pending INR trend tomorrow. Will follow INR closely given higher than normal dose -Daily PT/INR  Antonietta Jewel, PharmD, [place] Clinical Pharmacist  Pager: [phone] Phone: [phone] 02/13/2018 11:40 AM

## 2018-02-13 NOTE — Progress Notes (Signed)
Progress Note  Patient Name: Omar Lambert Date of Encounter: 02/13/2018  Primary Cardiologist: [name], MD   Subjective   Wife at bedside.  Limited history from the patient.  He denies shortness of breath at rest.  Inpatient Medications    Scheduled Meds: . allopurinol  300 mg Oral Daily  . bisoprolol  5 mg Oral Daily  . calcitRIOL  0.25 mcg Oral QODAY  . calcitRIOL  0.5 mcg Oral QODAY  . haloperidol lactate  1 mg Intravenous Once  . isosorbide mononitrate  30 mg Oral Daily  . levothyroxine  175 mcg Oral QAC breakfast  . mouth rinse  15 mL Mouth Rinse BID  . multivitamin with minerals  1 tablet Oral Daily  . pantoprazole  40 mg Oral Daily  . polyethylene glycol  17 g Oral Daily  . potassium chloride SA  20 mEq Oral Daily  . rosuvastatin  20 mg Oral Daily  . senna-docusate  1 tablet Oral BID  . sodium chloride flush  3 mL Intravenous Q12H  . torsemide  60 mg Oral BID  . umeclidinium-vilanterol  1 puff Inhalation Daily  . warfarin  7 mg Oral ONCE-1800  . Warfarin - Pharmacist Dosing Inpatient   Does not apply q1800   Continuous Infusions: . sodium chloride Stopped (02/04/18 0004)   PRN Meds: sodium chloride, acetaminophen, albuterol, fluticasone, ondansetron (ZOFRAN) IV, sodium chloride flush   Vital Signs    Vitals:   02/13/18 0323 02/13/18 0733 02/13/18 0747 02/13/18 1230  BP:  124/80    Pulse:  72  83  Resp:  (!) 25    Temp:  97.7 F (36.5 C)  (!) 97.5 F (36.4 C)  TempSrc:  Oral  Oral  SpO2:  100% 100% 93%  Weight: 96 kg     Height:        Intake/Output Summary (Last 24 hours) at 02/13/2018 1418 Last data filed at 02/13/2018 1230 Gross per 24 hour  Intake 750 ml  Output 1300 ml  Net -550 ml   Filed Weights   02/10/18 0512 02/11/18 0348 02/13/18 0323  Weight: 98.9 kg 98.3 kg 96 kg    Telemetry    Atrial fibrillation - Personally Reviewed   Physical Exam  Elderly, chronically ill-appearing male, in no distress GEN: No acute distress.     Neck: No JVD Cardiac:  Irregularly irregular Respiratory: Clear to auscultation bilaterally. GI: Soft, nontender, non-distended  MS: No edema; No deformity. Neuro:  Nonfocal  Psych: Normal affect   Labs    Chemistry Recent Labs  Lab 02/11/18 0237 02/12/18 0228 02/13/18 0228  NA 138 138 139  K 3.8 3.6 3.9  CL 85* 85* 86*  CO2 41* 38* 42*  GLUCOSE 101* 91 98  BUN 83* 79* 90*  CREATININE 3.39* 3.82* 4.01*  CALCIUM 10.0 9.9 10.2  GFRNONAA 16* 14* 13*  GFRAA 19* 16* 16*  ANIONGAP 12 15 11      Hematology Recent Labs  Lab 02/11/18 0237 02/12/18 0228 02/13/18 0228  WBC 4.8 5.3 5.9  RBC 3.43* 3.49* 3.64*  HGB 10.8* 11.0* 11.5*  HCT 36.8* 36.9* 38.8*  MCV 107.3* 105.7* 106.6*  MCH 31.5 31.5 31.6  MCHC 29.3* 29.8* 29.6*  RDW 17.0* 17.0* 16.9*  PLT 161 148* 186    Cardiac EnzymesNo results for input(s): TROPONINI in the last 168 hours. No results for input(s): TROPIPOC in the last 168 hours.   BNPNo results for input(s): BNP, PROBNP in the last 168 hours.  DDimer No results for input(s): DDIMER in the last 168 hours.   Radiology    No results found.   Patient Profile     78 y.o. male with acute on chronic diastolic heart failure and prominent RV failure, advanced renal disease, presenting with massive volume overload  Assessment & Plan    1. Acute on chronic diastolic heart failure: The patient has diuresed well with marked fluid removal/weight loss since admission.  Unfortunately his kidney function continues to worsen based on increasing creatinine levels.  Nephrology team is following him closely.  His diuretics are being placed on hold and repeat metabolic panel is ordered for tomorrow.  2.  Permanent atrial fibrillation: Managed with warfarin.  3.  Chronic kidney disease stage IV: Worsening renal function, clearly a component of cardiorenal syndrome with severe RV dysfunction.  I had a lengthy discussion with the patient's wife today.  She understands  that he will have a very difficult time going forward balancing his heart failure and advanced kidney disease.  I think he would do very poorly with hemodialysis if he progresses to end-stage renal disease.  She understands that he does not have an acute indication for hemodialysis at this time.  Palliative care following the patient as well.  Notes of nephrology and palliative care are reviewed and appreciate their excellent care of this patient.  For questions or updates, please contact [place] Please consult [email or web address] for contact info under        Signed, Sherren Mocha, MD  02/13/2018, 2:18 PM

## 2018-02-13 NOTE — Progress Notes (Signed)
[facility] Progress Note    Assessment/ Plan:   1. Acute diastolic CHF. On Lasix 120 IV BID- continue for now.Net neg 7.7 L since admission. Renal function (as expected) is fluctuating. Continues to be fluid overloaded. 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   RHC (1/3):  Mean RA 22 PA 49/21 Mean PCWP 20 CI 2.4  Echo: EF 50-55%, severe LVH, d-shaped septum, RV mildly dilated, PASP 62 mmHg.   2. Acute kidney injury superimposed onCKD IV. Baseline is2.5but has fluctuated in the past.He has required emergent hemodialysis before- no indications for now.Chronic component due to DM/HTN with (+)proteinuria. AKI likely due to hemodynamic fluctuations with diuresis.  - Stopped the Lasix IV after single dose on 1/8 and start transition to torsemide 60 mg BID starting today. - Will evaluate again tomorrow; if he doesn't turn about tomorrow will consider holding to diuretics and giving gentle hydration. He is down 11.5 kg during this hospitalization.   Will also give another dose of Acetazolamide 250mg  x1  3. HTN. BP control excellent- avoid overcontrol as diuresis continues  4. DM. Per primary  5.COPD: home O2 of 2-3 L.   6.Metabolic alkalosis.Given single dose of acetazolamide 250 mg 1/7. Will monitor for now  7. Dispo: in stepdown. Pall care was consulted previously during hospitalization- appreciate assistance.  Subjective:   Breathing is better and denies f/c/n/v. Very good appetite.  Spouse bedside again this AM.   Objective:   BP 124/80 (BP Location: Right Arm)   Pulse 72   Temp 97.7 F (36.5 C) (Oral)   Resp (!) 25   Ht 5\' 9"  (1.753 m)   Wt 96 kg   SpO2 100%   BMI 31.25 kg/m   Intake/Output Summary (Last 24 hours) at 02/13/2018 1038 Last data filed at 02/13/2018 0900 Gross per 24 hour  Intake 1230 ml  Output 1600 ml  Net -370 ml   Weight change:   Physical  Exam: PJK:DTOIZ gentleman, NAD, supine and comfortable TIW:PYKDXIPJA, soft systolic murmur Resp:distant BS, rales at bases Abd:+ distended, NABS Ext:1+ LE edema GU: Foley to gravity  Imaging: No results found.  Labs: BMET Recent Labs  Lab 02/07/18 0351 02/08/18 2505 02/09/18 0541 02/10/18 0234 02/11/18 0237 02/12/18 0228 02/13/18 0228  NA 141 142 142 139 138 138 139  K 4.0 4.1 4.0 3.7 3.8 3.6 3.9  CL 87* 89* 88* 88* 85* 85* 86*  CO2 42* 41* 40* 44* 41* 38* 42*  GLUCOSE 103* 113* 100* 93 101* 91 98  BUN 72* 77* 78* 81* 83* 79* 90*  CREATININE 2.90* 2.94* 3.24* 3.10* 3.39* 3.82* 4.01*  CALCIUM 9.8 9.7 9.7 9.7 10.0 9.9 10.2  PHOS  --   --   --  3.9  --   --   --    CBC Recent Labs  Lab 02/10/18 0234 02/11/18 0237 02/12/18 0228 02/13/18 0228  WBC 5.3 4.8 5.3 5.9  NEUTROABS 3.9 3.3 3.6 4.3  HGB 10.6* 10.8* 11.0* 11.5*  HCT 36.4* 36.8* 36.9* 38.8*  MCV 109.3* 107.3* 105.7* 106.6*  PLT 151 161 148* 186    Medications:    . allopurinol  300 mg Oral Daily  . bisoprolol  5 mg Oral Daily  . calcitRIOL  0.25 mcg Oral QODAY  . calcitRIOL  0.5 mcg Oral QODAY  . haloperidol lactate  1 mg Intravenous Once  . isosorbide mononitrate  30 mg Oral Daily  . levothyroxine  175 mcg Oral QAC breakfast  . mouth rinse  15 mL Mouth Rinse BID  . multivitamin with minerals  1 tablet Oral Daily  . pantoprazole  40 mg Oral Daily  . polyethylene glycol  17 g Oral Daily  . potassium chloride SA  20 mEq Oral Daily  . rosuvastatin  20 mg Oral Daily  . senna-docusate  1 tablet Oral BID  . sodium chloride flush  3 mL Intravenous Q12H  . torsemide  60 mg Oral BID  . umeclidinium-vilanterol  1 puff Inhalation Daily  . Warfarin - Pharmacist Dosing Inpatient   Does not apply q1800      Otelia Santee, MD 02/13/2018, 10:38 AM

## 2018-02-13 NOTE — Patient Outreach (Signed)
[facility]) Care Management  02/13/2018  Omar Lambert 11/27/1940 [id number]  Noted that member has been admitted to the hospital for greater than ten days.   Per workflow, will close case at this time. Hospital Liaisons notified and request made to place new referral if indicated pending disposition. Physician Case Closure Letter sent.   Omar Mola "ANN" [name], RN-BSN  [facility] Care Management  Community Care Management Coordinator  [phone] [email or web address]

## 2018-02-13 NOTE — Progress Notes (Signed)
Daily Progress Note   Patient Name: Omar Lambert       Date: 02/13/2018 DOB: 1940-04-26  Age: 78 y.o. MRN#: [id number] Attending Physician: Damita Lack, MD Primary Care Physician: Gayland Curry, DO Admit Date: 01/31/2018  Reason for Consultation/Follow-up: Psychosocial/spiritual support  Subjective: Patient is resting in bed with wife at bedside. He does not speak during the visit. His wife states when he does not wear his CPAP he becomes confused.   Typically, prior to admission, with the use of O2 he is active, driving and going to the store and appointments. She shows me pictures of him and his greatgrandson who is 79 years old. She discusses the importance of faith and family to them. She states that God has this all under control, and is thankful he is alive. She is eager to see how things go, and how his quality of life may be affected moving forward. She tells me he is eager for discharge home. Mrs. Koeppen is concerned for someone to stay with him while she works her part time job in the evenings. She received a phone call from their son and the visit ended.    Length of Stay: 13  Current Medications: Scheduled Meds:  . allopurinol  300 mg Oral Daily  . bisoprolol  5 mg Oral Daily  . calcitRIOL  0.25 mcg Oral QODAY  . calcitRIOL  0.5 mcg Oral QODAY  . haloperidol lactate  1 mg Intravenous Once  . isosorbide mononitrate  30 mg Oral Daily  . levothyroxine  175 mcg Oral QAC breakfast  . mouth rinse  15 mL Mouth Rinse BID  . multivitamin with minerals  1 tablet Oral Daily  . pantoprazole  40 mg Oral Daily  . polyethylene glycol  17 g Oral Daily  . potassium chloride SA  20 mEq Oral Daily  . rosuvastatin  20 mg Oral Daily  . senna-docusate  1 tablet Oral BID  . sodium  chloride flush  3 mL Intravenous Q12H  . torsemide  60 mg Oral BID  . umeclidinium-vilanterol  1 puff Inhalation Daily  . warfarin  7 mg Oral ONCE-1800  . Warfarin - Pharmacist Dosing Inpatient   Does not apply q1800    Continuous Infusions: . sodium chloride Stopped (02/04/18 0004)    PRN Meds: sodium  chloride, acetaminophen, albuterol, fluticasone, ondansetron (ZOFRAN) IV, sodium chloride flush  Physical Exam Constitutional:      Appearance: He is well-developed.  Pulmonary:     Effort: Pulmonary effort is normal.             Vital Signs: BP 124/80 (BP Location: Right Arm)   Pulse 83   Temp (!) 97.5 F (36.4 C) (Oral)   Resp (!) 25   Ht 5\' 9"  (1.753 m)   Wt 96 kg   SpO2 93%   BMI 31.25 kg/m  SpO2: SpO2: 93 % O2 Device: O2 Device: Nasal Cannula O2 Flow Rate: O2 Flow Rate (L/min): 2 L/min  Intake/output summary:   Intake/Output Summary (Last 24 hours) at 02/13/2018 1326 Last data filed at 02/13/2018 1230 Gross per 24 hour  Intake 750 ml  Output 1300 ml  Net -550 ml   LBM: Last BM Date: 02/06/18 Baseline Weight: Weight: 107.5 kg Most recent weight: Weight: 96 kg       Palliative Assessment/Data: 30%    Flowsheet Rows     Most Recent Value  Intake Tab  Referral Department  Hospitalist  Unit at Time of Referral  Intermediate Care Unit  Palliative Care Primary Diagnosis  Cardiac  Date Notified  02/07/18  Palliative Care Type  New Palliative care  Reason for referral  Clarify Goals of Care, Psychosocial or Spiritual support  Date of Admission  01/31/18  Date first seen by Palliative Care  02/09/18  # of days Palliative referral response time  2 Day(s)  # of days IP prior to Palliative referral  7  Clinical Assessment  Palliative Performance Scale Score  30%  Pain Max last 24 hours  Not able to report  Pain Min Last 24 hours  Not able to report  Dyspnea Max Last 24 Hours  Not able to report  Dyspnea Min Last 24 hours  Not able to report  Nausea Max Last  24 Hours  Not able to report  Nausea Min Last 24 Hours  Not able to report  Anxiety Max Last 24 Hours  Not able to report  Anxiety Min Last 24 Hours  Not able to report  Other Max Last 24 Hours  Not able to report  Psychosocial & Spiritual Assessment  Palliative Care Outcomes      Patient Active Problem List   Diagnosis Date Noted  . Palliative care by specialist   . Acute on chronic diastolic (congestive) heart failure ([place]) 01/31/2018  . Pressure injury of skin 11/27/2017  . Anemia 11/24/2017  . HCAP (healthcare-associated pneumonia) 11/24/2017  . Pleural effusion on right 10/11/2017  . Chronic respiratory failure with hypoxia and hypercapnia ([place]) 09/10/2017  . Chronic cor pulmonale ([place]) 09/10/2017  . Acute on chronic congestive heart failure ([place]) 08/29/2017  . Respiratory distress 08/29/2017  . Hypokalemia 08/29/2017  . Diabetic ulcer of lower leg ([place]) 08/29/2017  . CKD (chronic kidney disease), stage III ([place])   . Acute on chronic diastolic CHF (congestive heart failure) ([place])   . Elevated troponin I level   . Acute on chronic diastolic heart failure ([place]) 07/29/2017  . Chronic venous hypertension (idiopathic) with ulcer of right lower extremity (CODE) ([place]) 07/29/2017  . Blood clotting disorder ([place]) 12/03/2016  . Long term (current) use of anticoagulants [Z79.01] 11/12/2016  . Goals of care, counseling/discussion   . Bradycardia with 41-50 beats per minute   . Acute kidney injury superimposed on chronic kidney disease ([place])   . Shortness of breath  04/10/2016  . Diabetes mellitus with renal complications ([place])   . Pneumonia   . Hypothyroidism 03/29/2016  . Essential hypertension, benign 02/28/2015  . 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 Morbid obesity ([place]) 02/28/2015  . Hyperlipidemia associated with type 2 diabetes mellitus ([place]) 02/28/2015  . Persistent atrial fibrillation 12/18/2014  . Alteration in anticoagulation 12/18/2014  . Chronic kidney disease (CKD) stage  G3b/A2, moderately decreased glomerular filtration rate (GFR) between 30-44 mL/min/1.73 square meter and albuminuria creatinine ratio between 30-299 mg/g ([facility]) 08/19/2014  . Noncompliance with medication regimen 02/15/2014  . Environmental allergies 02/15/2014  . 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 Esophageal reflux 09/18/2013  . Venous insufficiency of both lower extremities 03/14/2013  . OSA on CPAP 12/22/2012  . COPD exacerbation ([place]) 11/26/2012  . Asthma   . Benign essential hypertension     Palliative Care Assessment & Plan    Recommendations/Plan: Recommend palliative to follow outpatient to continue [place] conversations as he progresses.   Code Status:    Code Status Orders  (From admission, onward)         Start     Ordered   01/31/18 2133  Full code  Continuous     01/31/18 2132        Code Status History    Date Active Date Inactive Code Status Order ID Comments User Context   11/24/2017 0526 12/06/2017 1708 Full Code [id number]  Jani Gravel, MD Inpatient   08/29/2017 0723 09/01/2017 1633 DNR [id number]  Radene Gunning, NP Inpatient   08/29/2017 0722 08/29/2017 0723 Full Code [id number]  Radene Gunning, NP Inpatient   12/08/2016 1255 08/29/2017 0247 DNR [id number] Discussed at clinic visit, scanned copy should be in documents and media Gayland Curry, DO Outpatient   11/06/2016 1639 11/09/2016 1629 DNR [id number]  Rush Farmer, MD Inpatient   10/27/2016 0306 11/06/2016 1639 Full Code [id number]  Kandice Hams, MD ED   04/09/2016 1644 04/12/2016 1629 Full Code [id number]  Brenton Grills, PA-C ED   10/28/2015 1634 10/30/2015 1353 Full Code [id number]  Mariel Aloe, MD Inpatient   08/18/2015 0726 08/20/2015 1158 Full Code [id number]  Caren Griffins, MD ED       Prognosis:  Poor long term.   Discharge Planning:  Home with Palliative Services  Thank you for allowing the Palliative Medicine Team to assist in the care of this patient.   Time In: 12:30 Time Out: 1:40  Total Time 60 min Prolonged Time Billed  no       Greater than 50%  of this time was spent counseling and coordinating care related to the above assessment and plan.  Asencion Gowda, NP  Please contact Palliative Medicine Team phone at [phone] for questions and concerns.

## 2018-02-13 NOTE — Progress Notes (Signed)
PROGRESS NOTE    Omar Lambert  GEX:[id number] DOB: 06-09-1940 DOA: 01/31/2018 PCP: Gayland Curry, DO   Brief Narrative:  78 year old with history of diabetes mellitus type 2, hypothyroidism, gout, essential hypertension, diastolic CHF, asthma, GERD came to the hospital complains of shortness of breath and swelling.  During the hospitalization patient was diuresed but creatinine started to trend up therefore nephrology team was consulted as well.   Assessment & Plan:   Active Problems:   Benign essential hypertension   COPD exacerbation (HCC)   Esophageal reflux   Chronic kidney disease (CKD) stage G3b/A2, moderately decreased glomerular filtration rate (GFR) between 30-44 mL/min/1.73 square meter and albuminuria creatinine ratio between 30-299 mg/g (HCC)   Persistent atrial fibrillation   Hypothyroidism   Diabetes mellitus with renal complications (HCC)   Shortness of breath   Goals of care, counseling/discussion   Acute on chronic congestive heart failure (HCC)   Acute on chronic diastolic (congestive) heart failure ([place])   Palliative care by specialist  Acute on chronic diastolic congestive heart failure, ejection fraction 50-55%, class III AKI on CKD stage III -Holding off on Lasix, will get Diamox 250 mg once.  Will transition to torsemide over next 24 hours depending on renal function tomorrow. -Echocardiogram showed ejection fraction 50-55% he is following.  Right heart catheterization showed elevated right-sided filling pressures -Appreciate nephrology input.  Persistent atrial fibrillation. -Currently patient is rate controlled, continue Coumadin.  Pharmacy to dose this.  Target INR 2.0-3.0.  INR is therapeutic today  Diabetes mellitus type 2 - Scale and Accu-Chek  Essential hypertension -Continue bisoprolol, Imdur, torsemide  COPD on chronic condition -Bronchodilators as needed  DVT prophylaxis: Coumadin Code Status: Full code Family Communication: Omar Lambert at  bedside Disposition Plan: To be determined  Consultants:   Cardiology  Nephrology  Procedures:   Right heart catheterization 1/3  Antimicrobials:   None   Subjective: Patient is awake this morning does have any complaints.  Omar Lambert tells me that his judgment appears to be slightly clouded but every time he uses CPAP it improves.  Last night patient did not really use CPAP  Review of Systems Otherwise negative except as per HPI, including: General = no fevers, chills, dizziness, malaise, fatigue HEENT/EYES = negative for pain, redness, loss of vision, double vision, blurred vision, loss of hearing, sore throat, hoarseness, dysphagia Cardiovascular= negative for chest pain, palpitation, murmurs, lower extremity swelling Respiratory/lungs= negative for shortness of breath, cough, hemoptysis, wheezing, mucus production Gastrointestinal= negative for nausea, vomiting,, abdominal pain, melena, hematemesis Genitourinary= negative for Dysuria, Hematuria, Change in Urinary Frequency MSK = Negative for arthralgia, myalgias, Back Pain, Joint swelling  Neurology= Negative for headache, seizures, numbness, tingling  Psychiatry= Negative for anxiety, depression, suicidal and homocidal ideation Allergy/Immunology= Medication/Food allergy as listed  Skin= Negative for Rash, lesions, ulcers, itching   Objective: Vitals:   02/13/18 0253 02/13/18 0323 02/13/18 0733 02/13/18 0747  BP: 118/62  124/80   Pulse:   72   Resp: (!) 27  (!) 25   Temp: 98.2 F (36.8 C)  97.7 F (36.5 C)   TempSrc: Oral  Oral   SpO2:   100% 100%  Weight:  96 kg    Height:        Intake/Output Summary (Last 24 hours) at 02/13/2018 1210 Last data filed at 02/13/2018 0900 Gross per 24 hour  Intake 990 ml  Output 950 ml  Net 40 ml   Filed Weights   02/10/18 0512 02/11/18 0348 02/13/18 0102  Weight: 98.9 kg 98.3 kg 96 kg    Examination:  Constitutional: NAD, calm, comfortable Eyes: PERRL, lids and  conjunctivae normal ENMT: Mucous membranes are moist. Posterior pharynx clear of any exudate or lesions.Normal dentition.  Neck: normal, supple, no masses, no thyromegaly Respiratory: diminished BS at the bases.  Cardiovascular: Regular rate and rhythm, no murmurs / rubs / gallops. No extremity edema. 2+ pedal pulses. No carotid bruits.  Abdomen: no tenderness, no masses palpated. No hepatosplenomegaly. Bowel sounds positive.  Musculoskeletal: no clubbing / cyanosis. No joint deformity upper and lower extremities. Good ROM, no contractures. Normal muscle tone.  Skin: no rashes, lesions, ulcers. No induration Neurologic: CN 2-12 grossly intact. Sensation intact, DTR normal. Strength 4/5 in all 4.  Psychiatric: Normal judgment and insight. Alert and oriented x 2. Normal mood.   Data Reviewed:   CBC: Recent Labs  Lab 02/09/18 0541 02/10/18 0234 02/11/18 0237 02/12/18 0228 02/13/18 0228  WBC 8.3 5.3 4.8 5.3 5.9  NEUTROABS 7.0 3.9 3.3 3.6 4.3  HGB 11.3* 10.6* 10.8* 11.0* 11.5*  HCT 39.9 36.4* 36.8* 36.9* 38.8*  MCV 110.2* 109.3* 107.3* 105.7* 106.6*  PLT 144* 151 161 148* 735   Basic Metabolic Panel: Recent Labs  Lab 02/09/18 0541 02/10/18 0234 02/11/18 0237 02/12/18 0228 02/13/18 0228  NA 142 139 138 138 139  K 4.0 3.7 3.8 3.6 3.9  CL 88* 88* 85* 85* 86*  CO2 40* 44* 41* 38* 42*  GLUCOSE 100* 93 101* 91 98  BUN 78* 81* 83* 79* 90*  CREATININE 3.24* 3.10* 3.39* 3.82* 4.01*  CALCIUM 9.7 9.7 10.0 9.9 10.2  MG  --   --   --   --  3.1*  PHOS  --  3.9  --   --   --    GFR: Estimated Creatinine Clearance: 17.4 mL/min (A) (by C-G formula based on SCr of 4.01 mg/dL (H)). Liver Function Tests: No results for input(s): AST, ALT, ALKPHOS, BILITOT, PROT, ALBUMIN in the last 168 hours. No results for input(s): LIPASE, AMYLASE in the last 168 hours. No results for input(s): AMMONIA in the last 168 hours. Coagulation Profile: Recent Labs  Lab 02/09/18 0541 02/10/18 0234  02/11/18 0237 02/12/18 0228 02/13/18 0228  INR 1.69 1.80 1.92 1.97 2.41   Cardiac Enzymes: No results for input(s): CKTOTAL, CKMB, CKMBINDEX, TROPONINI in the last 168 hours. BNP (last 3 results) No results for input(s): PROBNP in the last 8760 hours. HbA1C: No results for input(s): HGBA1C in the last 72 hours. CBG: Recent Labs  Lab 02/07/18 0747  GLUCAP 88   Lipid Profile: No results for input(s): CHOL, HDL, LDLCALC, TRIG, CHOLHDL, LDLDIRECT in the last 72 hours. Thyroid Function Tests: No results for input(s): TSH, T4TOTAL, FREET4, T3FREE, THYROIDAB in the last 72 hours. Anemia Panel: No results for input(s): VITAMINB12, FOLATE, FERRITIN, TIBC, IRON, RETICCTPCT in the last 72 hours. Sepsis Labs: No results for input(s): PROCALCITON, LATICACIDVEN in the last 168 hours.  No results found for this or any previous visit (from the past 240 hour(s)).       Radiology Studies: No results found.      Scheduled Meds: . acetaZOLAMIDE  250 mg Oral Once  . allopurinol  300 mg Oral Daily  . bisoprolol  5 mg Oral Daily  . calcitRIOL  0.25 mcg Oral QODAY  . calcitRIOL  0.5 mcg Oral QODAY  . haloperidol lactate  1 mg Intravenous Once  . isosorbide mononitrate  30 mg Oral Daily  . levothyroxine  175 mcg Oral QAC breakfast  . mouth rinse  15 mL Mouth Rinse BID  . multivitamin with minerals  1 tablet Oral Daily  . pantoprazole  40 mg Oral Daily  . polyethylene glycol  17 g Oral Daily  . potassium chloride SA  20 mEq Oral Daily  . rosuvastatin  20 mg Oral Daily  . senna-docusate  1 tablet Oral BID  . sodium chloride flush  3 mL Intravenous Q12H  . torsemide  60 mg Oral BID  . umeclidinium-vilanterol  1 puff Inhalation Daily  . Warfarin - Pharmacist Dosing Inpatient   Does not apply q1800   Continuous Infusions: . sodium chloride Stopped (02/04/18 0004)     LOS: 13 days   Time spent= 25 mins    Yarelie Hams Arsenio Loader, MD [facility]  If 7PM-7AM, please contact  night-coverage [email or web address] 02/13/2018, 12:10 PM

## 2018-02-14 LAB — BASIC METABOLIC PANEL
Anion gap: 12 (ref 5–15)
BUN: 97 mg/dL — ABNORMAL HIGH (ref 8–23)
CO2: 38 mmol/L — ABNORMAL HIGH (ref 22–32)
Calcium: 10.1 mg/dL (ref 8.9–10.3)
Chloride: 88 mmol/L — ABNORMAL LOW (ref 98–111)
Creatinine, Ser: 4.52 mg/dL — ABNORMAL HIGH (ref 0.61–1.24)
GFR calc Af Amer: 13 mL/min — ABNORMAL LOW (ref >60)
GFR calc non Af Amer: 12 mL/min — ABNORMAL LOW (ref >60)
GLUCOSE: 87 mg/dL (ref 70–99)
POTASSIUM: 3.7 mmol/L (ref 3.5–5.1)
Sodium: 138 mmol/L (ref 135–145)

## 2018-02-14 LAB — MAGNESIUM: Magnesium: 3.2 mg/dL — ABNORMAL HIGH (ref 1.7–2.4)

## 2018-02-14 LAB — PROTIME-INR
INR: 2.69
Prothrombin Time: 28.2 seconds — ABNORMAL HIGH (ref 11.4–15.2)

## 2018-02-14 MED ORDER — LACTULOSE 10 GM/15ML PO SOLN
20.0000 g | Freq: Two times a day (BID) | ORAL | Status: DC | PRN
  Administered 2018-02-14: 20 g via ORAL
  Filled 2018-02-14: qty 30

## 2018-02-14 MED ORDER — ALLOPURINOL 100 MG PO TABS
200.0000 mg | ORAL_TABLET | Freq: Every day | ORAL | Status: DC
  Administered 2018-02-15 – 2018-02-16 (×2): 200 mg via ORAL
  Filled 2018-02-14 (×2): qty 2

## 2018-02-14 MED ORDER — HALOPERIDOL LACTATE 5 MG/ML IJ SOLN
5.0000 mg | Freq: Once | INTRAMUSCULAR | Status: AC
  Administered 2018-02-14: 5 mg via INTRAVENOUS
  Filled 2018-02-14: qty 1

## 2018-02-14 MED ORDER — WARFARIN SODIUM 5 MG PO TABS
5.0000 mg | ORAL_TABLET | Freq: Once | ORAL | Status: AC
  Administered 2018-02-14: 5 mg via ORAL
  Filled 2018-02-14: qty 1

## 2018-02-14 NOTE — Care Management Important Message (Signed)
Important Message  Patient Details  Name: Omar Lambert MRN: [id number] Date of Birth: Dec 01, 1940   Medicare Important Message Given:  Yes    Omar Lambert [place] 02/14/2018, 11:46 AM

## 2018-02-14 NOTE — Progress Notes (Signed)
PROGRESS NOTE    Omar Lambert  TSV:[id number] DOB: 07-02-1940 DOA: 01/31/2018 PCP: Gayland Curry, DO   Brief Narrative:  78 year old with history of diabetes mellitus type 2, hypothyroidism, gout, essential hypertension, diastolic CHF, asthma, GERD came to the hospital complains of shortness of breath and swelling.  During the hospitalization patient was diuresed but creatinine started to trend up therefore nephrology team was consulted as well.   Assessment & Plan:   Active Problems:   Benign essential hypertension   COPD exacerbation (HCC)   Esophageal reflux   Chronic kidney disease (CKD) stage G3b/A2, moderately decreased glomerular filtration rate (GFR) between 30-44 mL/min/1.73 square meter and albuminuria creatinine ratio between 30-299 mg/g (HCC)   Persistent atrial fibrillation   Hypothyroidism   Diabetes mellitus with renal complications (HCC)   Shortness of breath   Goals of care, counseling/discussion   Acute on chronic congestive heart failure (HCC)   Acute on chronic diastolic (congestive) heart failure ([place])   Palliative care by specialist  Acute on chronic diastolic congestive heart failure, ejection fraction 50-55%, class III AKI on CKD stage III -Continues to increase this morning.  Nephrology team is following.  Await their further recommendations.  Concerned about some component of cardiorenal syndrome.  Today is 4.52 -Echocardiogram showed ejection fraction 50-55% he is following.  Right heart catheterization showed elevated right-sided filling pressures -We will reduce the dose of allopurinol  Persistent atrial fibrillation. -Currently patient is rate controlled, continue Coumadin.  Pharmacy to dose this.  Target INR 2.0-3.0.  Remains therapeutic at 2.6  Diabetes mellitus type 2 - Scale and Accu-Chek  Essential hypertension -Continue bisoprolol, Imdur, torsemide  COPD on chronic condition -Bronchodilators as needed  Dementia with intermittent  confusion - Patient off-and-on has slight confusion especially if he does not rest well overnight.  He is easily redirectable.  His confusion also worsens if he does not use his CPAP at night.  Obstructive sleep apnea - CPAP ordered but somewhat noncompliant.  Constipation -Scheduled MiraLAX and senna.  Added lactulose  DVT prophylaxis: Coumadin Code Status: Full code Family Communication: Wife at bedside Disposition Plan: To be determined Consultants:   Cardiology  Nephrology  Procedures:   Right heart catheterization 1/3  Antimicrobials:   None   Subjective: Patient is in any new complaints this morning.  Wife states he off-and-on gets confused but he is easily directable and follows all the commands.  Review of Systems Otherwise negative except as per HPI, including: General = no fevers, chills, dizziness, malaise, fatigue HEENT/EYES = negative for pain, redness, loss of vision, double vision, blurred vision, loss of hearing, sore throat, hoarseness, dysphagia Cardiovascular= negative for chest pain, palpitation, murmurs, lower extremity swelling Respiratory/lungs= negative for shortness of breath, cough, hemoptysis, wheezing, mucus production Gastrointestinal= negative for nausea, vomiting,, abdominal pain, melena, hematemesis Genitourinary= negative for Dysuria, Hematuria, Change in Urinary Frequency MSK = Negative for arthralgia, myalgias, Back Pain, Joint swelling  Neurology= Negative for headache, seizures, numbness, tingling  Psychiatry= Negative for anxiety, depression, suicidal and homocidal ideation Allergy/Immunology= Medication/Food allergy as listed  Skin= Negative for Rash, lesions, ulcers, itching   Objective: Vitals:   02/14/18 0402 02/14/18 0652 02/14/18 0738 02/14/18 0848  BP: 95/61  (!) 95/53   Pulse: (!) 57  (!) 58   Resp: 19     Temp:      TempSrc:      SpO2: 100%  100% 94%  Weight:  93.7 kg    Height:  Intake/Output Summary  (Last 24 hours) at 02/14/2018 1155 Last data filed at 02/14/2018 0650 Gross per 24 hour  Intake 240 ml  Output 1750 ml  Net -1510 ml   Filed Weights   02/11/18 0348 02/13/18 0323 02/14/18 0652  Weight: 98.3 kg 96 kg 93.7 kg    Examination:  Constitutional: NAD, calm, comfortable Eyes: PERRL, lids and conjunctivae normal ENMT: Mucous membranes are moist. Posterior pharynx clear of any exudate or lesions.Normal dentition.  Neck: normal, supple, no masses, no thyromegaly Respiratory: Slightly diminished breath sounds at the bases Cardiovascular: Regular rate and rhythm, no murmurs / rubs / gallops. No extremity edema. 2+ pedal pulses. No carotid bruits.  Abdomen: no tenderness, no masses palpated. No hepatosplenomegaly. Bowel sounds positive.  Musculoskeletal: no clubbing / cyanosis. No joint deformity upper and lower extremities. Good ROM, no contractures. Normal muscle tone.  Skin: no rashes, lesions, ulcers. No induration Neurologic: CN 2-12 grossly intact. Sensation intact, DTR normal. Strength 4/5 in all 4.  Psychiatric: Alert awake oriented X3  Data Reviewed:   CBC: Recent Labs  Lab 02/09/18 0541 02/10/18 0234 02/11/18 0237 02/12/18 0228 02/13/18 0228  WBC 8.3 5.3 4.8 5.3 5.9  NEUTROABS 7.0 3.9 3.3 3.6 4.3  HGB 11.3* 10.6* 10.8* 11.0* 11.5*  HCT 39.9 36.4* 36.8* 36.9* 38.8*  MCV 110.2* 109.3* 107.3* 105.7* 106.6*  PLT 144* 151 161 148* 400   Basic Metabolic Panel: Recent Labs  Lab 02/10/18 0234 02/11/18 0237 02/12/18 0228 02/13/18 0228 02/14/18 0249  NA 139 138 138 139 138  K 3.7 3.8 3.6 3.9 3.7  CL 88* 85* 85* 86* 88*  CO2 44* 41* 38* 42* 38*  GLUCOSE 93 101* 91 98 87  BUN 81* 83* 79* 90* 97*  CREATININE 3.10* 3.39* 3.82* 4.01* 4.52*  CALCIUM 9.7 10.0 9.9 10.2 10.1  MG  --   --   --  3.1* 3.2*  PHOS 3.9  --   --   --   --    GFR: Estimated Creatinine Clearance: 15.2 mL/min (A) (by C-G formula based on SCr of 4.52 mg/dL (H)). Liver Function Tests: No  results for input(s): AST, ALT, ALKPHOS, BILITOT, PROT, ALBUMIN in the last 168 hours. No results for input(s): LIPASE, AMYLASE in the last 168 hours. No results for input(s): AMMONIA in the last 168 hours. Coagulation Profile: Recent Labs  Lab 02/10/18 0234 02/11/18 0237 02/12/18 0228 02/13/18 0228 02/14/18 0249  INR 1.80 1.92 1.97 2.41 2.69   Cardiac Enzymes: No results for input(s): CKTOTAL, CKMB, CKMBINDEX, TROPONINI in the last 168 hours. BNP (last 3 results) No results for input(s): PROBNP in the last 8760 hours. HbA1C: No results for input(s): HGBA1C in the last 72 hours. CBG: Recent Labs  Lab 02/13/18 1829  GLUCAP 98   Lipid Profile: No results for input(s): CHOL, HDL, LDLCALC, TRIG, CHOLHDL, LDLDIRECT in the last 72 hours. Thyroid Function Tests: No results for input(s): TSH, T4TOTAL, FREET4, T3FREE, THYROIDAB in the last 72 hours. Anemia Panel: No results for input(s): VITAMINB12, FOLATE, FERRITIN, TIBC, IRON, RETICCTPCT in the last 72 hours. Sepsis Labs: No results for input(s): PROCALCITON, LATICACIDVEN in the last 168 hours.  No results found for this or any previous visit (from the past 240 hour(s)).       Radiology Studies: No results found.      Scheduled Meds: . [START ON 02/15/2018] allopurinol  200 mg Oral Daily  . bisoprolol  5 mg Oral Daily  . calcitRIOL  0.25 mcg Oral QODAY  .  calcitRIOL  0.5 mcg Oral QODAY  . haloperidol lactate  1 mg Intravenous Once  . isosorbide mononitrate  30 mg Oral Daily  . levothyroxine  175 mcg Oral QAC breakfast  . mouth rinse  15 mL Mouth Rinse BID  . multivitamin with minerals  1 tablet Oral Daily  . pantoprazole  40 mg Oral Daily  . polyethylene glycol  17 g Oral Daily  . potassium chloride SA  20 mEq Oral Daily  . rosuvastatin  20 mg Oral Daily  . senna-docusate  1 tablet Oral BID  . sodium chloride flush  3 mL Intravenous Q12H  . torsemide  60 mg Oral BID  . umeclidinium-vilanterol  1 puff Inhalation  Daily  . warfarin  5 mg Oral ONCE-1800  . Warfarin - Pharmacist Dosing Inpatient   Does not apply q1800   Continuous Infusions: . sodium chloride Stopped (02/04/18 0004)     LOS: 14 days       Ankit Arsenio Loader, MD [facility]  If 7PM-7AM, please contact night-coverage [email or web address] 02/14/2018, 11:55 AM

## 2018-02-14 NOTE — Progress Notes (Signed)
Occupational Therapy Treatment Patient Details Name: Omar Lambert MRN: [id number] DOB: 1940-07-11 Today's Date: 02/14/2018    History of present illness Daymien Goth Lambert a 78 y.o.malewith medical history significant ofDM2, Hypothyroidism, HTN, Gout, GERD, COPD, CKD, diastolic CHF, asthma who presents for SOB and swelling. Pt admitted for acute on chronic diastolic heart failure. Pt developed acute metabolic encephalopathy. Pt also with acute kidney injury superimposed on CKD.    OT comments  Pt with limited ability to participate in activities today due to lethargy. Per wife, pt had medication last night to calm him.   Follow Up Recommendations  Supervision - Intermittent    Equipment Recommendations  None recommended by OT(wife declining tub bench due to out of pocket expense)    Recommendations for Other Services      Precautions / Restrictions Precautions Precautions: Fall Precaution Comments: watch 02 saturations Restrictions Weight Bearing Restrictions: No       Mobility Bed Mobility Overal bed mobility: Needs Assistance Bed Mobility: Supine to Sit;Sit to Supine     Supine to sit: Min assist Sit to supine: Min assist   General bed mobility comments: min assist to raise trunk and for LEs into bed  Transfers                 General transfer comment: deferred due to lethargy and pt request to return to bed    Balance Overall balance assessment: Needs assistance   Sitting balance-Leahy Scale: Good                                     ADL either performed or assessed with clinical judgement   ADL Overall ADL's : Needs assistance/impaired     Grooming: Wash/dry hands;Wash/dry face;Sitting;Supervision/safety           Upper Body Dressing : Minimal assistance;Sitting                     General ADL Comments: Pt requesting to return to supine, sleepy.     Vision       Perception     Praxis      Cognition  Arousal/Alertness: Lethargic Behavior During Therapy: Flat affect Overall Cognitive Status: History of cognitive impairments - at baseline                                 General Comments: pt medicated last night per wife, still sleepy        Exercises     Shoulder Instructions       General Comments      Pertinent Vitals/ Pain       Pain Assessment: No/denies pain  Home Living                                          Prior Functioning/Environment              Frequency  Min 2X/week        Progress Toward Goals  OT Goals(current goals can now be found in the care plan section)  Progress towards OT goals: Not progressing toward goals - comment(pt lethargic)  Acute Rehab OT Goals Patient Stated Goal: to go home OT Goal Formulation: With patient/family Time For Goal Achievement:  02/17/18 Potential to Achieve Goals: Good  Plan Discharge plan remains appropriate    Co-evaluation                 AM-PAC OT "6 Clicks" Daily Activity     Outcome Measure   Help from another person eating meals?: None Help from another person taking care of personal grooming?: A Little Help from another person toileting, which includes using toliet, bedpan, or urinal?: A Little Help from another person bathing (including washing, rinsing, drying)?: A Little Help from another person to put on and taking off regular upper body clothing?: None Help from another person to put on and taking off regular lower body clothing?: A Little 6 Click Score: 20    End of Session Equipment Utilized During Treatment: Oxygen  OT Visit Diagnosis: History of falling (Z91.81)   Activity Tolerance Patient limited by lethargy;Patient limited by fatigue   Patient Left in bed;with call bell/phone within reach;with family/visitor present;with bed alarm set   Nurse Communication          Time: 1047-1106 OT Time Calculation (min): 19 min  Charges: OT  General Charges $OT Visit: 1 Visit OT Treatments $Self Care/Home Management : 8-22 mins  Nestor Lewandowsky, OTR/L Acute Rehabilitation Services Pager: [phone] Office: [phone]   [name] 02/14/2018, 11:12 AM

## 2018-02-14 NOTE — Progress Notes (Signed)
Daily Progress Note   Patient Name: Omar Lambert       Date: 02/14/2018 DOB: 01-19-1941  Age: 78 y.o. MRN#: [id number] Attending Physician: Damita Lack, MD Primary Care Physician: Gayland Curry, DO Admit Date: 01/31/2018  Reason for Consultation/Follow-up: Psychosocial/spiritual support  Subjective: Patient is resting in bed with wife at bedside. She states he has declined daily. She states she has been advised he would not be a good dialysis candidate and his kidneys are worse. He does not want to wear the CPAP/BIPAP. She states he told her weeks ago that he would not be here much longer, that he would be leaving soon. She states he is seeing people at the window. She tells me he would not want to live this way noting that he has been confused and sleeping most of the time.   Plans for a family meeting tomorrow to speak with the children. At this time, she would like to make him a DNR. She would not want chest compressions, shocks, or a breathing tube. She states "if the [facility] is going to take him, let him have his way." She would not want him intubated for respiratory distress/failure. We discussed continuing current interventions until conversation tomorrow, with likely transition to home with hospice.  I completed a MOST form today and the signed original was placed in the chart. A photocopy was placed in the chart to be scanned into EMR. The wife outlined patient's wishes for the following treatment decisions:  Cardiopulmonary Resuscitation: Do Not Attempt Resuscitation (DNR/No CPR)  Medical Interventions: Limited Additional Interventions: Use medical treatment, IV fluids and cardiac monitoring as indicated, DO NOT USE intubation or mechanical ventilation. May consider use of less  invasive airway support such as BiPAP or CPAP. Also provide comfort measures. Transfer to the hospital if indicated. Avoid intensive care.   Antibiotics: Antibiotics if indicated  IV Fluids: IV fluids if indicated  Feeding Tube: No feeding tube      Length of Stay: 14  Current Medications: Scheduled Meds:  . [START ON 02/15/2018] allopurinol  200 mg Oral Daily  . bisoprolol  5 mg Oral Daily  . calcitRIOL  0.25 mcg Oral QODAY  . calcitRIOL  0.5 mcg Oral QODAY  . haloperidol lactate  1 mg Intravenous Once  . isosorbide mononitrate  30  mg Oral Daily  . levothyroxine  175 mcg Oral QAC breakfast  . mouth rinse  15 mL Mouth Rinse BID  . multivitamin with minerals  1 tablet Oral Daily  . pantoprazole  40 mg Oral Daily  . polyethylene glycol  17 g Oral Daily  . potassium chloride SA  20 mEq Oral Daily  . rosuvastatin  20 mg Oral Daily  . senna-docusate  1 tablet Oral BID  . sodium chloride flush  3 mL Intravenous Q12H  . umeclidinium-vilanterol  1 puff Inhalation Daily  . warfarin  5 mg Oral ONCE-1800  . Warfarin - Pharmacist Dosing Inpatient   Does not apply q1800    Continuous Infusions: . sodium chloride Stopped (02/04/18 0004)    PRN Meds: sodium chloride, acetaminophen, albuterol, fluticasone, lactulose, ondansetron (ZOFRAN) IV, sodium chloride flush  Physical Exam Constitutional:      Appearance: He is well-developed.  Pulmonary:     Effort: Pulmonary effort is normal.  Neurological:     Comments: Sleeping             Vital Signs: BP (!) 95/53 (BP Location: Right Arm)   Pulse (!) 58   Temp 97.6 F (36.4 C) (Axillary)   Resp 19   Ht 5\' 9"  (1.753 m)   Wt 93.7 kg   SpO2 94%   BMI 30.51 kg/m  SpO2: SpO2: 94 % O2 Device: O2 Device: Nasal Cannula O2 Flow Rate: O2 Flow Rate (L/min): 3 L/min  Intake/output summary:   Intake/Output Summary (Last 24 hours) at 02/14/2018 1411 Last data filed at 02/14/2018 1030 Gross per 24 hour  Intake 720 ml  Output 1300 ml  Net  -580 ml   LBM: Last BM Date: 02/06/18 Baseline Weight: Weight: 107.5 kg Most recent weight: Weight: 93.7 kg       Palliative Assessment/Data: 30%    Flowsheet Rows     Most Recent Value  Intake Tab  Referral Department  Hospitalist  Unit at Time of Referral  Intermediate Care Unit  Palliative Care Primary Diagnosis  Cardiac  Date Notified  02/07/18  Palliative Care Type  New Palliative care  Reason for referral  Clarify Goals of Care, Psychosocial or Spiritual support  Date of Admission  01/31/18  Date first seen by Palliative Care  02/09/18  # of days Palliative referral response time  2 Day(s)  # of days IP prior to Palliative referral  7  Clinical Assessment  Palliative Performance Scale Score  30%  Pain Max last 24 hours  Not able to report  Pain Min Last 24 hours  Not able to report  Dyspnea Max Last 24 Hours  Not able to report  Dyspnea Min Last 24 hours  Not able to report  Nausea Max Last 24 Hours  Not able to report  Nausea Min Last 24 Hours  Not able to report  Anxiety Max Last 24 Hours  Not able to report  Anxiety Min Last 24 Hours  Not able to report  Other Max Last 24 Hours  Not able to report  Psychosocial & Spiritual Assessment  Palliative Care Outcomes      Patient Active Problem List   Diagnosis Date Noted  . Palliative care by specialist   . Acute on chronic diastolic (congestive) heart failure ([place]) 01/31/2018  . Pressure injury of skin 11/27/2017  . Anemia 11/24/2017  . HCAP (healthcare-associated pneumonia) 11/24/2017  . Pleural effusion on right 10/11/2017  . Chronic respiratory failure with hypoxia and hypercapnia (HCC)  09/10/2017  . Chronic cor pulmonale ([place]) 09/10/2017  . Acute on chronic congestive heart failure ([place]) 08/29/2017  . Respiratory distress 08/29/2017  . Hypokalemia 08/29/2017  . Diabetic ulcer of lower leg ([place]) 08/29/2017  . CKD (chronic kidney disease), stage III ([place])   . Acute on chronic diastolic CHF (congestive heart  failure) ([place])   . Elevated troponin I level   . Acute on chronic diastolic heart failure ([place]) 07/29/2017  . Chronic venous hypertension (idiopathic) with ulcer of right lower extremity (CODE) ([place]) 07/29/2017  . Blood clotting disorder ([place]) 12/03/2016  . Long term (current) use of anticoagulants [Z79.01] 11/12/2016  . Goals of care, counseling/discussion   . Bradycardia with 41-50 beats per minute   . Acute kidney injury superimposed on chronic kidney disease ([place])   . Shortness of breath 04/10/2016  . Diabetes mellitus with renal complications ([place])   . Pneumonia   . Hypothyroidism 03/29/2016  . Essential hypertension, benign 02/28/2015  . 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 Morbid obesity ([place]) 02/28/2015  . Hyperlipidemia associated with type 2 diabetes mellitus ([place]) 02/28/2015  . Persistent atrial fibrillation 12/18/2014  . Alteration in anticoagulation 12/18/2014  . Chronic kidney disease (CKD) stage G3b/A2, moderately decreased glomerular filtration rate (GFR) between 30-44 mL/min/1.73 square meter and albuminuria creatinine ratio between 30-299 mg/g (HCC) 08/19/2014  . Noncompliance with medication regimen 02/15/2014  . Environmental allergies 02/15/2014  . 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 Esophageal reflux 09/18/2013  . Venous insufficiency of both lower extremities 03/14/2013  . OSA on CPAP 12/22/2012  . COPD exacerbation ([place]) 11/26/2012  . Asthma   . Benign essential hypertension     Palliative Care Assessment & Plan    Recommendations/Plan: Recommend palliative to follow outpatient to continue [place] conversations as he progresses.   Code Status:    Code Status Orders  (From admission, onward)         Start     Ordered   01/31/18 2133  Full code  Continuous     01/31/18 2132        Code Status History    Date Active Date Inactive Code Status Order ID Comments User Context   11/24/2017 0526 12/06/2017 1708 Full Code [id number]  Jani Gravel, MD Inpatient    08/29/2017 0723 09/01/2017 1633 DNR [id number]  Radene Gunning, NP Inpatient   08/29/2017 0722 08/29/2017 0723 Full Code [id number]  Radene Gunning, NP Inpatient   12/08/2016 1255 08/29/2017 0247 DNR [id number] Discussed at clinic visit, scanned copy should be in documents and media Gayland Curry, DO Outpatient   11/06/2016 1639 11/09/2016 1629 DNR [id number]  Rush Farmer, MD Inpatient   10/27/2016 0306 11/06/2016 1639 Full Code [id number]  Kandice Hams, MD ED   04/09/2016 1644 04/12/2016 1629 Full Code [id number]  Brenton Grills, PA-C ED   10/28/2015 1634 10/30/2015 1353 Full Code [id number]  Mariel Aloe, MD Inpatient   08/18/2015 0726 08/20/2015 1158 Full Code [id number]  Caren Griffins, MD ED       Prognosis:  Poor.   Discharge Planning: Likely home with hospice  Thank you for allowing the Palliative Medicine Team to assist in the care of this patient.   Time In: 1:00 Time Out: 2:20 Total Time 80 min Prolonged Time Billed  yes       Greater than 50%  of this time was spent counseling and coordinating care related to the above assessment and plan.  Asencion Gowda, NP  Please contact Palliative Medicine  Team phone at [phone] for questions and concerns.

## 2018-02-14 NOTE — Progress Notes (Signed)
[facility] Progress Note    Assessment/ Plan:   1. Acute diastolic CHF. On Lasix 120 IV BID- continue for now.Net neg7.7L since admission. Renal function (as expected) is fluctuating. Continues to be fluid overloaded. 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   RHC (1/3):  Mean RA 22 PA 49/21 Mean PCWP 20 CI 2.4  Echo: EF 50-55%, severe LVH, d-shaped septum, RV mildly dilated, PASP 62 mmHg.   2. Acute kidney injury superimposed onCKD IV. Baseline is2.5but has fluctuated in the past.He has required emergent hemodialysis before- no indications for now.Chronic componentdue to DM/HTN with (+)proteinuria. AKI likely due to hemodynamic fluctuations with diuresis. - Stopped the Lasix IV after single dose on 1/8 and start transition totorsemide 60 mg BIDstarting 1/9. - Still having great UOP but renal function cont to worsen. - He is really not a chronic dialysis candidate and I would favor holding the torsemide and re-evaluating. If he chooses the hospice route which I think is reasonable then would d/c with torsemide 30mg  1-2 tabs BID titrated to response. Fortunately he is still responding to even oral diuretics.   3. HTN. BP control excellent- avoid overcontrol as diuresis continues  4. DM. Per primary  5.COPD: home O2 of 2-3 L.   6.Metabolicalkalosis.[place] dose of acetazolamide 250 mg 1/7. Will monitor for now  7. Dispo: in stepdown. Pall care was consulted previously during hospitalization- appreciate assistance.  Subjective:   Breathing is better and denies f/c/n/v. Very good appetite.  Spouse bedside again today.   Objective:   BP (!) 95/53 (BP Location: Right Arm)   Pulse (!) 58   Temp 97.6 F (36.4 C) (Axillary)   Resp 19   Ht 5\' 9"  (1.753 m)   Wt 93.7 kg   SpO2 94%   BMI 30.51 kg/m   Intake/Output Summary (Last 24 hours) at 02/14/2018 1317 Last  data filed at 02/14/2018 1030 Gross per 24 hour  Intake 720 ml  Output 1300 ml  Net -580 ml   Weight change: -2.3 kg  Physical Exam: XVQ:MGQQP gentleman, NAD,supine and comfortable YPP:JKDTOIZTI, soft systolic murmur Resp:distant BS, rales at bases Abd:+ distended, NABS Ext:1+ LE edema   Imaging: No results found.  Labs: BMET Recent Labs  Lab 02/08/18 4580 02/09/18 0541 02/10/18 0234 02/11/18 0237 02/12/18 0228 02/13/18 0228 02/14/18 0249  NA 142 142 139 138 138 139 138  K 4.1 4.0 3.7 3.8 3.6 3.9 3.7  CL 89* 88* 88* 85* 85* 86* 88*  CO2 41* 40* 44* 41* 38* 42* 38*  GLUCOSE 113* 100* 93 101* 91 98 87  BUN 77* 78* 81* 83* 79* 90* 97*  CREATININE 2.94* 3.24* 3.10* 3.39* 3.82* 4.01* 4.52*  CALCIUM 9.7 9.7 9.7 10.0 9.9 10.2 10.1  PHOS  --   --  3.9  --   --   --   --    CBC Recent Labs  Lab 02/10/18 0234 02/11/18 0237 02/12/18 0228 02/13/18 0228  WBC 5.3 4.8 5.3 5.9  NEUTROABS 3.9 3.3 3.6 4.3  HGB 10.6* 10.8* 11.0* 11.5*  HCT 36.4* 36.8* 36.9* 38.8*  MCV 109.3* 107.3* 105.7* 106.6*  PLT 151 161 148* 186    Medications:    . [START ON 02/15/2018] allopurinol  200 mg Oral Daily  . bisoprolol  5 mg Oral Daily  . calcitRIOL  0.25 mcg Oral QODAY  . calcitRIOL  0.5 mcg Oral QODAY  . haloperidol lactate  1  mg Intravenous Once  . isosorbide mononitrate  30 mg Oral Daily  . levothyroxine  175 mcg Oral QAC breakfast  . mouth rinse  15 mL Mouth Rinse BID  . multivitamin with minerals  1 tablet Oral Daily  . pantoprazole  40 mg Oral Daily  . polyethylene glycol  17 g Oral Daily  . potassium chloride SA  20 mEq Oral Daily  . rosuvastatin  20 mg Oral Daily  . senna-docusate  1 tablet Oral BID  . sodium chloride flush  3 mL Intravenous Q12H  . torsemide  60 mg Oral BID  . umeclidinium-vilanterol  1 puff Inhalation Daily  . warfarin  5 mg Oral ONCE-1800  . Warfarin - Pharmacist Dosing Inpatient   Does not apply q1800      Otelia Santee, MD 02/14/2018, 1:17 PM

## 2018-02-14 NOTE — Progress Notes (Signed)
PT Cancellation Note  Patient Details Name: Omar Lambert MRN: [id number] DOB: May 01, 1940   Cancelled Treatment:    Reason Eval/Treat Not Completed: Medical issues which prohibited therapy Patient more lethargic with worsening medical status. Family and RN requesting to hold therapy at this time. Will follow up as medically appropriate and as schedule allows.   Erick Blinks, SPT  Erick Blinks 02/14/2018, 2:01 PM

## 2018-02-14 NOTE — Progress Notes (Signed)
Progress Note  Patient Name: Omar Lambert Date of Encounter: 02/14/2018  Primary Cardiologist: [name], MD   Subjective   The patient is somnolent today, unable to provide any history.  Inpatient Medications    Scheduled Meds: . [START ON 02/15/2018] allopurinol  200 mg Oral Daily  . bisoprolol  5 mg Oral Daily  . calcitRIOL  0.25 mcg Oral QODAY  . calcitRIOL  0.5 mcg Oral QODAY  . haloperidol lactate  1 mg Intravenous Once  . isosorbide mononitrate  30 mg Oral Daily  . levothyroxine  175 mcg Oral QAC breakfast  . mouth rinse  15 mL Mouth Rinse BID  . multivitamin with minerals  1 tablet Oral Daily  . pantoprazole  40 mg Oral Daily  . polyethylene glycol  17 g Oral Daily  . potassium chloride SA  20 mEq Oral Daily  . rosuvastatin  20 mg Oral Daily  . senna-docusate  1 tablet Oral BID  . sodium chloride flush  3 mL Intravenous Q12H  . umeclidinium-vilanterol  1 puff Inhalation Daily  . warfarin  5 mg Oral ONCE-1800  . Warfarin - Pharmacist Dosing Inpatient   Does not apply q1800   Continuous Infusions: . sodium chloride Stopped (02/04/18 0004)   PRN Meds: sodium chloride, acetaminophen, albuterol, fluticasone, lactulose, ondansetron (ZOFRAN) IV, sodium chloride flush   Vital Signs    Vitals:   02/14/18 0402 02/14/18 0652 02/14/18 0738 02/14/18 0848  BP: 95/61  (!) 95/53   Pulse: (!) 57  (!) 58   Resp: 19     Temp:      TempSrc:      SpO2: 100%  100% 94%  Weight:  93.7 kg    Height:        Intake/Output Summary (Last 24 hours) at 02/14/2018 1359 Last data filed at 02/14/2018 1030 Gross per 24 hour  Intake 720 ml  Output 1300 ml  Net -580 ml   Last 3 Weights 02/14/2018 02/13/2018 02/11/2018  Weight (lbs) 206 lb 9.1 oz 211 lb 10.3 oz 216 lb 11.4 oz  Weight (kg) 93.7 kg 96 kg 98.3 kg      Telemetry    Atrial fibrillation - Personally Reviewed  Physical Exam  Somnolent male in no distress GEN: No acute distress.   Neck: No JVD Cardiac: irregularly  irregular  Respiratory: Clear to auscultation bilaterally. GI: Soft, nontender, non-distended  MS: No edema; No deformity. Neuro:  Nonfocal  Psych: Normal affect   Labs    Chemistry Recent Labs  Lab 02/12/18 0228 02/13/18 0228 02/14/18 0249  NA 138 139 138  K 3.6 3.9 3.7  CL 85* 86* 88*  CO2 38* 42* 38*  GLUCOSE 91 98 87  BUN 79* 90* 97*  CREATININE 3.82* 4.01* 4.52*  CALCIUM 9.9 10.2 10.1  GFRNONAA 14* 13* 12*  GFRAA 16* 16* 13*  ANIONGAP 15 11 12      Hematology Recent Labs  Lab 02/11/18 0237 02/12/18 0228 02/13/18 0228  WBC 4.8 5.3 5.9  RBC 3.43* 3.49* 3.64*  HGB 10.8* 11.0* 11.5*  HCT 36.8* 36.9* 38.8*  MCV 107.3* 105.7* 106.6*  MCH 31.5 31.5 31.6  MCHC 29.3* 29.8* 29.6*  RDW 17.0* 17.0* 16.9*  PLT 161 148* 186    Cardiac EnzymesNo results for input(s): TROPONINI in the last 168 hours. No results for input(s): TROPIPOC in the last 168 hours.   BNPNo results for input(s): BNP, PROBNP in the last 168 hours.   DDimer No results for  input(s): DDIMER in the last 168 hours.   Radiology    No results found.  Patient Profile     78 y.o. male with acute on chronic diastolic heart failure and prominent RV failure, advanced renal disease, presenting with massive volume overload  Assessment & Plan    1.  Acute on chronic diastolic heart failure: The patient has advanced right heart failure.  This is complicated by severe chronic kidney disease.  Nephrology team following.  Diuretics are on hold.  His volume status is much better with his weight down over 30 pounds from admission.  2.  Acute on chronic kidney injury: Patient's creatinine is up to 4.5 mg/dL.  Renal team is following.  I had a long discussion with the patient's wife again today.  She is very realistic about his poor outlook.  I do not think she would want him to go on hemodialysis.  Palliative care team is following him.  3.  Permanent atrial fibrillation: Continue warfarin  I will sign off  today as his renal function is being followed by Nephrology, diuretics are on hold, and plan for diuretics are outlined by Dr Augustin Coupe. Please call if any questions.   [facility] will sign off.   Medication Recommendations:  none Other recommendations (labs, testing, etc):  none Follow up as an outpatient:  Will arrange  For questions or updates, please contact [place] Please consult [email or web address] for contact info under        Signed, Sherren Mocha, MD  02/14/2018, 1:59 PM

## 2018-02-14 NOTE — Progress Notes (Signed)
ANTICOAGULATION CONSULT NOTE - Follow up [place] for coumadin Indication: atrial fibrillation  No Known Allergies  Patient Measurements: Height: 5\' 9"  (175.3 cm) Weight: 206 lb 9.1 oz (93.7 kg) IBW/kg (Calculated) : 70.7  Vital Signs: Temp: 97.6 F (36.4 C) (01/10 0326) Temp Source: Axillary (01/10 0326) BP: 95/53 (01/10 0738) Pulse Rate: 58 (01/10 0738)  Labs: Recent Labs    02/12/18 0228 02/13/18 0228 02/14/18 0249  HGB 11.0* 11.5*  --   HCT 36.9* 38.8*  --   PLT 148* 186  --   LABPROT 22.1* 25.9* 28.2*  INR 1.97 2.41 2.69  CREATININE 3.82* 4.01* 4.52*    Estimated Creatinine Clearance: 15.2 mL/min (A) (by C-G formula based on SCr of 4.52 mg/dL (H)).   Medical History: Past Medical History:  Diagnosis Date  . Acute kidney injury superimposed on chronic kidney disease ([place]) 11/2016  . Acute respiratory failure ([place]) 11/2016  . Allergy   . Asthma   . Atrial fibrillation ([place])   . 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 Benign essential hypertension   . CHF (congestive heart failure) ([place])   . CKD (chronic kidney disease), stage III ([place])   . COPD (chronic obstructive pulmonary disease) ([place])   . Diabetes ([place])   . Diabetes mellitus without complication ([place])   . Dyspnea   . Gastritis   . GERD (gastroesophageal reflux disease)   . Gout attack 09/2012  . Hypertension   . Sinusitis, maxillary, chronic   . Thyroid disease   . Type II or unspecified type diabetes mellitus without mention of complication, not stated as uncontrolled     Assessment: 38 yoM admitted with SOB and swelling. Pt on warfarin at home for AFib.   INR remains therapeutic at 2.69 (from 2.41) following boosted doses. Hgb 11.5, plt 186. No s/sx of bleeding documented.    Home dose: 2.5mg  daily except for 5mg  on Mon/Fri  Goal of Therapy:  INR 2-3 Monitor platelets by anticoagulation protocol: Yes   Plan:  -Warfarin 5 mg today. Will follow INR  closely given higher than normal dose -Daily PT/INR  Antonietta Jewel, PharmD, [place] Clinical Pharmacist  Pager: [phone] Phone: [phone] 02/14/2018 11:26 AM

## 2018-02-14 NOTE — Progress Notes (Signed)
Text paged provider advise patient having non-sustain runs of V-tach and V-fib. Heart rate sustain in 60-70. Intermittently increasing to 110's-120's.  SPO's maintaining above 95%.. RR between 22-30. RN will continue to monitor.

## 2018-02-14 NOTE — Care Management Note (Addendum)
Case Management Note Previous Note Created by Claudie Leach  Patient Details  Name: BREYDAN SHILLINGBURG MRN: [id number] Date of Birth: 1941/01/31  Subjective/Objective:      Spoke with patient regarding services at home. Patient was not able to recall agencies' names.  Spoke with wife over the phone.  Ms. Pundt states they have a RN to help with PT draws and medications from [facility].  They had PT until a few weeks ago and she and patient would like to resume PT.    Pt is on home O2 at 2lpm. Oxygen is through [place].  Pt has a stationary concentrator and small portable tanks.  Pt's wife states tanks are too heavy and only last 2 hours.  She states they are in the process of getting a portable concentrator.        Wife states patient does not need RW or 3n1.  They have 3n1.  Wife requests a tub bench.  Advised tub bench would not be covered by Medicare and they may use 3n1 in shower.  Wife states they will use 3n1 for bathing. No other DME needed.   Action/Plan: Medicare [place] list left on chart and copy left in patient's room.  Pt's wife states they do not want to change [facility] agencies.    CM will continue to follow.   Expected Discharge Date:                  Expected Discharge Plan:  [place]  In-House Referral:  NA  Discharge planning Services  CM Consult  Post Acute Care Choice:  Home Health, Durable Medical Equipment Choice offered to:  Patient, Spouse  DME Arranged:  [place] rolling with seat DME Agency:  [place]:  RN, PT, OT, Nurse's Aide, Social Work [employer] Agency:  [facility] [facility] [employer]  Status of Service:  In process, will continue to follow  If discussed at Long Length of Stay Meetings, dates discussed:    Additional Comments: 02/14/18 Pt remains confused with telesitter.  Therapy continues to recommend [place] made aware of latest [place] order written 02/12/18.  Pts renal function continues to decline.   Palliative following however goals of care has not yet been established.  CM had in depth discussion with wife - pt weighs daily and adheres to a low salt diet.  Wife remains interested in taking pt home with [facility] - wife understands that [facility] will not provide 24 hour coverage - wife informed CM that she will likely stop working at pts discharge.  Wife confirmed she has portable oxygen tank for transport home.  Wife in agreement with rollator - agency chose given - [facility] chosen - agency contacted and referral accepted.  Wife informed CM that [facility] is still desired.   Maryclare Labrador, RN 02/14/2018, 11:55 AM

## 2018-02-15 LAB — BASIC METABOLIC PANEL
Anion gap: 15 (ref 5–15)
BUN: 106 mg/dL — ABNORMAL HIGH (ref 8–23)
CALCIUM: 10.4 mg/dL — AB (ref 8.9–10.3)
CO2: 37 mmol/L — ABNORMAL HIGH (ref 22–32)
Chloride: 88 mmol/L — ABNORMAL LOW (ref 98–111)
Creatinine, Ser: 5.02 mg/dL — ABNORMAL HIGH (ref 0.61–1.24)
GFR calc Af Amer: 12 mL/min — ABNORMAL LOW (ref >60)
GFR calc non Af Amer: 10 mL/min — ABNORMAL LOW (ref >60)
Glucose, Bld: 107 mg/dL — ABNORMAL HIGH (ref 70–99)
Potassium: 4 mmol/L (ref 3.5–5.1)
Sodium: 140 mmol/L (ref 135–145)

## 2018-02-15 LAB — MAGNESIUM: MAGNESIUM: 3.6 mg/dL — AB (ref 1.7–2.4)

## 2018-02-15 LAB — PROTIME-INR
INR: 3
Prothrombin Time: 30.7 seconds — ABNORMAL HIGH (ref 11.4–15.2)

## 2018-02-15 MED ORDER — WARFARIN SODIUM 1 MG PO TABS
1.0000 mg | ORAL_TABLET | Freq: Once | ORAL | Status: AC
  Administered 2018-02-15: 1 mg via ORAL
  Filled 2018-02-15: qty 1

## 2018-02-15 NOTE — Progress Notes (Signed)
PROGRESS NOTE    Omar Lambert  AGT:[id number] DOB: 1940-11-22 DOA: 01/31/2018 PCP: Gayland Curry, DO   Brief Narrative:  78 year old with history of diabetes mellitus type 2, hypothyroidism, gout, essential hypertension, diastolic CHF, asthma, GERD came to the hospital complains of shortness of breath and swelling.  During the hospitalization patient was diuresed but creatinine started to trend up therefore nephrology team was consulted as well. Patient was seen by Cardiology and thought he was in cardiorenal syndrome.  Despite of careful management of diuretics and fluid status, his kidney numbers continue to increase.  Patient was seen by palliative care and eventually he was made DNR/DNI and family opted to take him home with home hospice.   Assessment & Plan:   Active Problems:   Benign essential hypertension   COPD exacerbation (HCC)   Esophageal reflux   Chronic kidney disease (CKD) stage G3b/A2, moderately decreased glomerular filtration rate (GFR) between 30-44 mL/min/1.73 square meter and albuminuria creatinine ratio between 30-299 mg/g (HCC)   Persistent atrial fibrillation   Hypothyroidism   Diabetes mellitus with renal complications (HCC)   Shortness of breath   Goals of care, counseling/discussion   Acute on chronic congestive heart failure (HCC)   Acute on chronic diastolic (congestive) heart failure ([place])   Palliative care by specialist  Acute on chronic diastolic congestive heart failure, ejection fraction 50-55%, class III AKI on CKD stage III -Continues to increase this morning.  Nephrology team is following.  Await their further recommendations.  Concerned about some component of cardiorenal syndrome.  Creatinine continues to rise at 5.02. -Echocardiogram showed ejection fraction 50-55% he is following.  Right heart catheterization showed elevated right-sided filling pressures -Allopurinol dose has been reduced -Patient will be discharged home on torsemide 30 mg  twice daily along with extra dose as needed.  Persistent atrial fibrillation. -Currently patient is rate controlled, continue Coumadin.  Pharmacy to dose this.  Target INR 2.0-3.0.  INR today is 3.0.  Diabetes mellitus type 2 - Continue sliding scale and Accu-Chek.  Essential hypertension -Continue bisoprolol, Imdur, torsemide  COPD on chronic condition -Bronchodilators as needed  Dementia with intermittent confusion - Patient off-and-on has slight confusion especially if he does not rest well overnight.  He is easily redirectable.  His confusion also worsens if he does not use his CPAP at night.  Obstructive sleep apnea - CPAP ordered but somewhat noncompliant.  Constipation -Scheduled MiraLAX and senna.  Added lactulose  Goals of care discussion- appreciate palliative care input, family opts for home hospice.  I had an extensive discussion with the family as well this morning and all the questions have been answered.  If patient wants, he can go home with [facility] for comfort measures.  DVT prophylaxis: Coumadin Code Status: Full code Family Communication: Wife and daughter at bedside Disposition Plan: Transition home with hospice Consultants:   Cardiology  Nephrology  Procedures:   Right heart catheterization 1/3  Antimicrobials:   None   Subjective: No complaints this morning by the patient.  He is resting well.  I had an extensive discussion with the patient's daughter and his wife at bedside this morning.  I have explained them that his renal function continues to worsen at this point and he is not a candidate for dialysis given his advanced heart condition.  At this point family is opted to discuss his care with hospice and transitioning home.  Review of Systems Otherwise negative except as per HPI, including: General = no fevers, chills, dizziness,  malaise, fatigue HEENT/EYES = negative for pain, redness, loss of vision, double vision, blurred vision, loss  of hearing, sore throat, hoarseness, dysphagia Cardiovascular= negative for chest pain, palpitation, murmurs, lower extremity swelling Respiratory/lungs= negative for shortness of breath, cough, hemoptysis, wheezing, mucus production Gastrointestinal= negative for nausea, vomiting,, abdominal pain, melena, hematemesis Genitourinary= negative for Dysuria, Hematuria, Change in Urinary Frequency MSK = Negative for arthralgia, myalgias, Back Pain, Joint swelling  Neurology= Negative for headache, seizures, numbness, tingling  Psychiatry= Negative for anxiety, depression, suicidal and homocidal ideation Allergy/Immunology= Medication/Food allergy as listed  Skin= Negative for Rash, lesions, ulcers, itching   Objective: Vitals:   02/15/18 0000 02/15/18 0348 02/15/18 0500 02/15/18 0833  BP:  101/64 91/69 (!) 101/58  Pulse:  70  71  Resp: 20 (!) 23 18 17   Temp:  98.6 F (37 C)  98 F (36.7 C)  TempSrc:  Oral  Oral  SpO2:  96%  96%  Weight:   93.2 kg   Height:        Intake/Output Summary (Last 24 hours) at 02/15/2018 1130 Last data filed at 02/15/2018 0800 Gross per 24 hour  Intake 120 ml  Output 1000 ml  Net -880 ml   Filed Weights   02/13/18 0323 02/14/18 0652 02/15/18 0500  Weight: 96 kg 93.7 kg 93.2 kg    Examination:  Constitutional: NAD, calm, comfortable Eyes: PERRL, lids and conjunctivae normal ENMT: Mucous membranes are moist. Posterior pharynx clear of any exudate or lesions.Normal dentition.  Neck: normal, supple, no masses, no thyromegaly Respiratory: clear to auscultation bilaterally, no wheezing, no crackles. Normal respiratory effort. No accessory muscle use.  Cardiovascular: Regular rate and rhythm, no murmurs / rubs / gallops. No extremity edema. 2+ pedal pulses. No carotid bruits.  Abdomen: no tenderness, no masses palpated. No hepatosplenomegaly. Bowel sounds positive.  Musculoskeletal: no clubbing / cyanosis. No joint deformity upper and lower extremities.  Good ROM, no contractures. Normal muscle tone.  Skin: no rashes, lesions, ulcers. No induration Neurologic: CN 2-12 grossly intact. Sensation intact, DTR normal. Strength 4/5 in all 4.  Psychiatric: Normal judgment and insight. Alert and oriented x 3. Normal mood.   Data Reviewed:   CBC: Recent Labs  Lab 02/09/18 0541 02/10/18 0234 02/11/18 0237 02/12/18 0228 02/13/18 0228  WBC 8.3 5.3 4.8 5.3 5.9  NEUTROABS 7.0 3.9 3.3 3.6 4.3  HGB 11.3* 10.6* 10.8* 11.0* 11.5*  HCT 39.9 36.4* 36.8* 36.9* 38.8*  MCV 110.2* 109.3* 107.3* 105.7* 106.6*  PLT 144* 151 161 148* 700   Basic Metabolic Panel: Recent Labs  Lab 02/10/18 0234 02/11/18 0237 02/12/18 0228 02/13/18 0228 02/14/18 0249 02/15/18 0233  NA 139 138 138 139 138 140  K 3.7 3.8 3.6 3.9 3.7 4.0  CL 88* 85* 85* 86* 88* 88*  CO2 44* 41* 38* 42* 38* 37*  GLUCOSE 93 101* 91 98 87 107*  BUN 81* 83* 79* 90* 97* 106*  CREATININE 3.10* 3.39* 3.82* 4.01* 4.52* 5.02*  CALCIUM 9.7 10.0 9.9 10.2 10.1 10.4*  MG  --   --   --  3.1* 3.2* 3.6*  PHOS 3.9  --   --   --   --   --    GFR: Estimated Creatinine Clearance: 13.7 mL/min (A) (by C-G formula based on SCr of 5.02 mg/dL (H)). Liver Function Tests: No results for input(s): AST, ALT, ALKPHOS, BILITOT, PROT, ALBUMIN in the last 168 hours. No results for input(s): LIPASE, AMYLASE in the last 168 hours. No results for  input(s): AMMONIA in the last 168 hours. Coagulation Profile: Recent Labs  Lab 02/11/18 0237 02/12/18 0228 02/13/18 0228 02/14/18 0249 02/15/18 0233  INR 1.92 1.97 2.41 2.69 3.00   Cardiac Enzymes: No results for input(s): CKTOTAL, CKMB, CKMBINDEX, TROPONINI in the last 168 hours. BNP (last 3 results) No results for input(s): PROBNP in the last 8760 hours. HbA1C: No results for input(s): HGBA1C in the last 72 hours. CBG: Recent Labs  Lab 02/13/18 1829  GLUCAP 98   Lipid Profile: No results for input(s): CHOL, HDL, LDLCALC, TRIG, CHOLHDL, LDLDIRECT in the  last 72 hours. Thyroid Function Tests: No results for input(s): TSH, T4TOTAL, FREET4, T3FREE, THYROIDAB in the last 72 hours. Anemia Panel: No results for input(s): VITAMINB12, FOLATE, FERRITIN, TIBC, IRON, RETICCTPCT in the last 72 hours. Sepsis Labs: No results for input(s): PROCALCITON, LATICACIDVEN in the last 168 hours.  No results found for this or any previous visit (from the past 240 hour(s)).       Radiology Studies: No results found.      Scheduled Meds: . allopurinol  200 mg Oral Daily  . bisoprolol  5 mg Oral Daily  . calcitRIOL  0.25 mcg Oral QODAY  . calcitRIOL  0.5 mcg Oral QODAY  . haloperidol lactate  1 mg Intravenous Once  . isosorbide mononitrate  30 mg Oral Daily  . levothyroxine  175 mcg Oral QAC breakfast  . mouth rinse  15 mL Mouth Rinse BID  . multivitamin with minerals  1 tablet Oral Daily  . pantoprazole  40 mg Oral Daily  . polyethylene glycol  17 g Oral Daily  . potassium chloride SA  20 mEq Oral Daily  . rosuvastatin  20 mg Oral Daily  . senna-docusate  1 tablet Oral BID  . sodium chloride flush  3 mL Intravenous Q12H  . umeclidinium-vilanterol  1 puff Inhalation Daily  . Warfarin - Pharmacist Dosing Inpatient   Does not apply q1800   Continuous Infusions: . sodium chloride Stopped (02/04/18 0004)     LOS: 15 days       Malky Rudzinski Arsenio Loader, MD [facility]  If 7PM-7AM, please contact night-coverage [email or web address] 02/15/2018, 11:30 AM

## 2018-02-15 NOTE — Progress Notes (Signed)
2C-12 [facility]) RN note  Notified by Claudie Leach, RNCM, of patient/family request for [facility] services at home after discharge. Chart and patient information under review by [facility] physician. Hospice eligibility pending at this time.Marland Kitchen  Spoke with pt's wife Earlie Server by phone to acknowledge referral. Per discussion, plan is for discharge to home by [facility] Sunday February 16, 2018.  Please send signed and completed out of facility DNR form home with patient/family.  Patient will need prescriptions for discharge comfort medications.  DME needs discussed, patient currently has O2, CPAP in the home. [name] requests O2, CPAP, shower bench, 3 in 1 and OBT for delivery to the home. [facility] liaison will order equipment with [facility] Sunday as it was after hours today and equipment was not able to be ordered. Home address has been verified and is correct in the chart. Earlie Server is family contact to arrange time of equipment delivery.  [facility]erral [facility] aware of the above. Completed discharge summary will need to be faxed to [facility] at [phone] when final. Please notify [facility] when patient is ready to leave the unit at discharge. (Call [phone] Mon-Fri 830 am-500 pm. Call [phone] weekends and after 5 pm).  Please call with hospice related questions.  Thank you for this referral.  Margaretmary Eddy, RN, BSN [facility] Liaison [place] are on AMION

## 2018-02-15 NOTE — Care Management Note (Signed)
Case Management Note  Patient Details  Name: Omar Lambert MRN: [id number] Date of Birth: [birth year]  Subjective/Objective:           Pt and family have elected to d/c home with hospice services after palliative meeting this afternoon.  Spoke with wife and grandson at bedside.  [facility].  Pt has O2 through [place].  Pt also has RW, 3n1 and recliner.  Wife states there is no room for a hospital bed but she would be interested in something to prop pt in bed or can used pillows.  They also request an overbed table and a shower stool.  Family is flexible for patient to d/c when arrangements are made and desire d/c ASAP.  We discussed possible dc on Sunday around noon.  Pt will need ambulance transport home.       Action/Plan: Referral called to [facility].  She will call wife this evening and admission process will be resumed tomorrow morning.  Expected Discharge Date:                  Expected Discharge Plan:  Home w Hospice Care  In-House Referral:  NA  Discharge planning Services  CM Consult  Post Acute Care Choice:  Hospice, Durable Medical Equipment Choice offered to:  Spouse  DME Arranged:    DME Agency:     HH Arranged:    [place] Agency:  [facility]  Status of Service:  In process, will continue to follow  If discussed at Long Length of Stay Meetings, dates discussed:    Additional Comments:  Claudie Leach, RN 02/15/2018, 5:25 PM

## 2018-02-15 NOTE — Progress Notes (Signed)
Family at bedside- patient declined use of CPAP tonight.

## 2018-02-15 NOTE — Progress Notes (Addendum)
Daily Progress Note   Patient Name: Omar Lambert       Date: 02/15/2018 DOB: October 07, 1940  Age: 78 y.o. MRN#: [id number] Attending Physician: Damita Lack, MD Primary Care Physician: Gayland Curry, DO Admit Date: 01/31/2018  Reason for Consultation/Follow-up: Psychosocial/spiritual support  Subjective: Patient is resting in bed with wife at bedside. All family members are at bedside. Patient is quiet.   We discussed his diagnosis, prognosis, GOC, EOL wishes disposition and options. We discussed [name] status and the plan of hospice at home discussed yesterday.  A detailed discussion was had today regarding advanced directives.  Concepts specific to code status, artifical feeding and hydration, IV antibiotics and rehospitalization were discussed.  The difference between an aggressive medical intervention path and a comfort care path was discussed.  Values and goals of care important to patient and family were attempted to be elicited.  Discussed limitations of medical interventions to prolong quality of life for this patient at this time in this situation and discussed the concept of human mortality.      Questions and concerns addressed.    Mrs. Omar Lambert tells her family this is not what he would want and he is ready to go home and be comfortable for what time he has left. She feels he would want to be in the comfort of his home with his family surrounding him until the Reita Cliche takes him home. The children move to a consult room and are very tearful. Support offered. They state their father is very tired and he is ready to go. They state they cannot be selfish and must let him go.     I completed a MOST form today and the signed original was placed in the chart. A photocopy was placed  in the chart to be scanned into EMR. Ms. Omar Lambert outlined their wishes for the following treatment decisions:  Cardiopulmonary Resuscitation: Do Not Attempt Resuscitation (DNR/No CPR)  Medical Interventions: Comfort Measures: Keep clean, warm, and dry. Use medication by any route, positioning, wound care, and other measures to relieve pain and suffering. Use oxygen, suction and manual treatment of airway obstruction as needed for comfort. Do not transfer to the hospital unless comfort needs cannot be met in current location.  Antibiotics: No antibiotics (use other measures to relieve symptoms)  IV Fluids:  No IV fluids (provide other measures to ensure comfort)  Feeding Tube: No feeding tube    Length of Stay: 15  Current Medications: Scheduled Meds:  . allopurinol  200 mg Oral Daily  . bisoprolol  5 mg Oral Daily  . calcitRIOL  0.25 mcg Oral QODAY  . calcitRIOL  0.5 mcg Oral QODAY  . haloperidol lactate  1 mg Intravenous Once  . isosorbide mononitrate  30 mg Oral Daily  . levothyroxine  175 mcg Oral QAC breakfast  . mouth rinse  15 mL Mouth Rinse BID  . multivitamin with minerals  1 tablet Oral Daily  . pantoprazole  40 mg Oral Daily  . polyethylene glycol  17 g Oral Daily  . potassium chloride SA  20 mEq Oral Daily  . rosuvastatin  20 mg Oral Daily  . senna-docusate  1 tablet Oral BID  . sodium chloride flush  3 mL Intravenous Q12H  . umeclidinium-vilanterol  1 puff Inhalation Daily  . warfarin  1 mg Oral ONCE-1800  . Warfarin - Pharmacist Dosing Inpatient   Does not apply q1800    Continuous Infusions: . sodium chloride Stopped (02/04/18 0004)    PRN Meds: sodium chloride, acetaminophen, albuterol, fluticasone, lactulose, ondansetron (ZOFRAN) IV, sodium chloride flush  Physical Exam Constitutional:      Appearance: He is well-developed.  Pulmonary:     Effort: Pulmonary effort is normal.  Neurological:     Comments: Eyes open but does not speak.              Vital  Signs: BP (!) 96/59   Pulse 63   Temp 98 F (36.7 C) (Axillary)   Resp 19   Ht '5\' 9"'  (1.753 m)   Wt 93.2 kg   SpO2 99%   BMI 30.34 kg/m  SpO2: SpO2: 99 % O2 Device: O2 Device: Nasal Cannula O2 Flow Rate: O2 Flow Rate (L/min): 3 L/min  Intake/output summary:   Intake/Output Summary (Last 24 hours) at 02/15/2018 1614 Last data filed at 02/15/2018 0800 Gross per 24 hour  Intake 120 ml  Output 1000 ml  Net -880 ml   LBM: Last BM Date: 02/15/18(per Night shift RN; did not witness) Baseline Weight: Weight: 107.5 kg Most recent weight: Weight: 93.2 kg       Palliative Assessment/Data: 30%    Flowsheet Rows     Most Recent Value  Intake Tab  Referral Department  Hospitalist  Unit at Time of Referral  Intermediate Care Unit  Palliative Care Primary Diagnosis  Cardiac  Date Notified  02/07/18  Palliative Care Type  New Palliative care  Reason for referral  Clarify Goals of Care, Psychosocial or Spiritual support  Date of Admission  01/31/18  Date first seen by Palliative Care  02/09/18  # of days Palliative referral response time  2 Day(s)  # of days IP prior to Palliative referral  7  Clinical Assessment  Palliative Performance Scale Score  30%  Pain Max last 24 hours  Not able to report  Pain Min Last 24 hours  Not able to report  Dyspnea Max Last 24 Hours  Not able to report  Dyspnea Min Last 24 hours  Not able to report  Nausea Max Last 24 Hours  Not able to report  Nausea Min Last 24 Hours  Not able to report  Anxiety Max Last 24 Hours  Not able to report  Anxiety Min Last 24 Hours  Not able to report  Other Max Last 24  Hours  Not able to report  Psychosocial & Spiritual Assessment  Palliative Care Outcomes      Patient Active Problem List   Diagnosis Date Noted  . Palliative care by specialist   . Acute on chronic diastolic (congestive) heart failure ([place]) 01/31/2018  . Pressure injury of skin 11/27/2017  . Anemia 11/24/2017  . HCAP (healthcare-associated  pneumonia) 11/24/2017  . Pleural effusion on right 10/11/2017  . Chronic respiratory failure with hypoxia and hypercapnia ([place]) 09/10/2017  . Chronic cor pulmonale ([place]) 09/10/2017  . Acute on chronic congestive heart failure ([place]) 08/29/2017  . Respiratory distress 08/29/2017  . Hypokalemia 08/29/2017  . Diabetic ulcer of lower leg ([place]) 08/29/2017  . CKD (chronic kidney disease), stage III ([place])   . Acute on chronic diastolic CHF (congestive heart failure) ([place])   . Elevated troponin I level   . Acute on chronic diastolic heart failure ([place]) 07/29/2017  . Chronic venous hypertension (idiopathic) with ulcer of right lower extremity (CODE) ([place]) 07/29/2017  . Blood clotting disorder ([place]) 12/03/2016  . Long term (current) use of anticoagulants [Z79.01] 11/12/2016  . Goals of care, counseling/discussion   . Bradycardia with 41-50 beats per minute   . Acute kidney injury superimposed on chronic kidney disease ([place])   . Shortness of breath 04/10/2016  . Diabetes mellitus with renal complications ([place])   . Pneumonia   . Hypothyroidism 03/29/2016  . Essential hypertension, benign 02/28/2015  . 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 Morbid obesity ([place]) 02/28/2015  . Hyperlipidemia associated with type 2 diabetes mellitus ([place]) 02/28/2015  . Persistent atrial fibrillation 12/18/2014  . Alteration in anticoagulation 12/18/2014  . Chronic kidney disease (CKD) stage G3b/A2, moderately decreased glomerular filtration rate (GFR) between 30-44 mL/min/1.73 square meter and albuminuria creatinine ratio between 30-299 mg/g (HCC) 08/19/2014  . Noncompliance with medication regimen 02/15/2014  . Environmental allergies 02/15/2014  . 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 Esophageal reflux 09/18/2013  . Venous insufficiency of both lower extremities 03/14/2013  . OSA on CPAP 12/22/2012  . COPD exacerbation ([place]) 11/26/2012  . Asthma   . Benign essential hypertension     Palliative Care Assessment & Plan     Recommendations/Plan: Recommend home with hospice.    Code Status:    Code Status Orders  (From admission, onward)         Start     Ordered   01/31/18 2133  Full code  Continuous     01/31/18 2132        Code Status History    Date Active Date Inactive Code Status Order ID Comments User Context   11/24/2017 0526 12/06/2017 1708 Full Code [id number]  Jani Gravel, MD Inpatient   08/29/2017 0723 09/01/2017 1633 DNR [id number]  Radene Gunning, NP Inpatient   08/29/2017 0722 08/29/2017 0723 Full Code [id number]  Radene Gunning, NP Inpatient   12/08/2016 1255 08/29/2017 0247 DNR [id number] Discussed at clinic visit, scanned copy should be in documents and media Gayland Curry, DO Outpatient   11/06/2016 1639 11/09/2016 1629 DNR [id number]  Rush Farmer, MD Inpatient   10/27/2016 0306 11/06/2016 1639 Full Code [id number]  Kandice Hams, MD ED   04/09/2016 1644 04/12/2016 1629 Full Code [id number]  Brenton Grills, PA-C ED   10/28/2015 1634 10/30/2015 1353 Full Code [id number]  Mariel Aloe, MD Inpatient   08/18/2015 0726 08/20/2015 1158 Full Code [id number]  Caren Griffins, MD ED       Prognosis:  < 6 months. Acute  on chronic heart failure. Advanced right heart failure. Attempted diuresis.Creatinine trending up. Not a good candidate for dialysis.  Does not like to wear CPAP/BIPAP. Pulmonary HTN  Discharge Planning: Home with hospice  Thank you for allowing the Palliative Medicine Team to assist in the care of this patient.   Time In: 3:15 Time Out: 4:40 Total Time 85 min Prolonged Time Billed  yes       Greater than 50%  of this time was spent counseling and coordinating care related to the above assessment and plan.  Asencion Gowda, NP  Please contact Palliative Medicine Team phone at [phone] for questions and concerns.

## 2018-02-15 NOTE — Progress Notes (Signed)
ANTICOAGULATION CONSULT NOTE - Follow up [facility] for coumadin Indication: atrial fibrillation  No Known Allergies  Patient Measurements: Height: 5\' 9"  (175.3 cm) Weight: 205 lb 7.5 oz (93.2 kg) IBW/kg (Calculated) : 70.7  Vital Signs: Temp: 98 F (36.7 C) (01/11 1159) Temp Source: Axillary (01/11 1159) BP: 86/68 (01/11 1159) Pulse Rate: 63 (01/11 1159)  Labs: Recent Labs    02/13/18 0228 02/14/18 0249 02/15/18 0233  HGB 11.5*  --   --   HCT 38.8*  --   --   PLT 186  --   --   LABPROT 25.9* 28.2* 30.7*  INR 2.41 2.69 3.00  CREATININE 4.01* 4.52* 5.02*    Estimated Creatinine Clearance: 13.7 mL/min (A) (by C-G formula based on SCr of 5.02 mg/dL (H)).   Medical History: Past Medical History:  Diagnosis Date  . Acute kidney injury superimposed on chronic kidney disease ([place]) 11/2016  . Acute respiratory failure ([place]) 11/2016  . Allergy   . Asthma   . Atrial fibrillation ([place])   . 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 Benign essential hypertension   . CHF (congestive heart failure) ([place])   . CKD (chronic kidney disease), stage III ([place])   . COPD (chronic obstructive pulmonary disease) ([place])   . Diabetes ([place])   . Diabetes mellitus without complication ([place])   . Dyspnea   . Gastritis   . GERD (gastroesophageal reflux disease)   . Gout attack 09/2012  . Hypertension   . Sinusitis, maxillary, chronic   . Thyroid disease   . Type II or unspecified type diabetes mellitus without mention of complication, not stated as uncontrolled     Assessment: 56 yoM admitted with SOB and swelling. Pt on warfarin at home for AFib.   INR remains therapeutic at 3 big jump with higher than pta doses. Hgb 11.5, plt 186. No s/sx of bleeding documented.    Home dose: 2.5mg  daily except for 5mg  on Mon/Fri  Goal of Therapy:  INR 2-3 Monitor platelets by anticoagulation protocol: Yes   Plan:  -Warfarin 1 mg today. Will follow INR closely  given higher than normal dose -Daily PT/INR  Bonnita Nasuti Pharm.D. CPP, BCPS Clinical Pharmacist [phone] 02/15/2018 3:45 PM

## 2018-02-16 DIAGNOSIS — Z23 Encounter for immunization: Secondary | ICD-10-CM | POA: Diagnosis not present

## 2018-02-16 DIAGNOSIS — N19 Unspecified kidney failure: Secondary | ICD-10-CM

## 2018-02-16 LAB — CBC
HCT: 39.9 % (ref 39.0–52.0)
Hemoglobin: 11.8 g/dL — ABNORMAL LOW (ref 13.0–17.0)
MCH: 31.4 pg (ref 26.0–34.0)
MCHC: 29.6 g/dL — ABNORMAL LOW (ref 30.0–36.0)
MCV: 106.1 fL — ABNORMAL HIGH (ref 80.0–100.0)
Platelets: 203 10*3/uL (ref 150–400)
RBC: 3.76 MIL/uL — ABNORMAL LOW (ref 4.22–5.81)
RDW: 16.8 % — AB (ref 11.5–15.5)
WBC: 5.9 10*3/uL (ref 4.0–10.5)
nRBC: 0 % (ref 0.0–0.2)

## 2018-02-16 LAB — BASIC METABOLIC PANEL
Anion gap: 14 (ref 5–15)
BUN: 115 mg/dL — ABNORMAL HIGH (ref 8–23)
CO2: 38 mmol/L — ABNORMAL HIGH (ref 22–32)
Calcium: 10.3 mg/dL (ref 8.9–10.3)
Chloride: 87 mmol/L — ABNORMAL LOW (ref 98–111)
Creatinine, Ser: 5.34 mg/dL — ABNORMAL HIGH (ref 0.61–1.24)
GFR calc Af Amer: 11 mL/min — ABNORMAL LOW (ref >60)
GFR calc non Af Amer: 9 mL/min — ABNORMAL LOW (ref >60)
Glucose, Bld: 100 mg/dL — ABNORMAL HIGH (ref 70–99)
Potassium: 3.9 mmol/L (ref 3.5–5.1)
Sodium: 139 mmol/L (ref 135–145)

## 2018-02-16 LAB — PROTIME-INR
INR: 3.53
Prothrombin Time: 34.8 seconds — ABNORMAL HIGH (ref 11.4–15.2)

## 2018-02-16 MED ORDER — POLYETHYLENE GLYCOL 3350 17 G PO PACK: 17.0000 g | PACK | Freq: Every day | ORAL | 0 refills | Status: DC | PRN

## 2018-02-16 MED ORDER — ALPRAZOLAM 0.5 MG PO TABS: 0.5000 mg | ORAL_TABLET | Freq: Three times a day (TID) | ORAL | 0 refills | Status: DC | PRN

## 2018-02-16 MED ORDER — SENNOSIDES-DOCUSATE SODIUM 8.6-50 MG PO TABS: 1.0000 | ORAL_TABLET | Freq: Every evening | ORAL | 0 refills | Status: DC | PRN

## 2018-02-16 MED ORDER — BISOPROLOL FUMARATE 5 MG PO TABS: 5.0000 mg | ORAL_TABLET | Freq: Every day | ORAL | 0 refills | Status: DC

## 2018-02-16 MED ORDER — OXYCODONE HCL 5 MG PO CAPS: 5.0000 mg | ORAL_CAPSULE | Freq: Four times a day (QID) | ORAL | 0 refills | Status: DC | PRN

## 2018-02-16 MED ORDER — TORSEMIDE 20 MG PO TABS: 30.0000 mg | ORAL_TABLET | Freq: Two times a day (BID) | ORAL | 0 refills | Status: DC

## 2018-02-16 NOTE — Discharge Summary (Signed)
Physician Discharge Summary  ARLO BUTT AYT:[id number] DOB: [birth year] DOA: 01/31/2018  PCP: Gayland Curry, DO  Admit date: 01/31/2018 Discharge date: 02/16/2018  Admitted From: Home  Disposition:  Home with Hospice   Recommendations for Outpatient Follow-up:  1. Follow up with PCP in 1-2 weeks 2. Please obtain BMP/CBC in one week your next doctors visit.  3. Discharging with home hospice 4. Torsemide reduced to 30 mg twice daily, can take additional 30 mg as needed.  Appreciate nephrology input  Discharge Condition: Stable CODE STATUS: DNR/DNI-most form filled by palliative care service Diet recommendation: As tolerated  Brief/Interim Summary: 78 year old with history of diabetes mellitus type 2, hypothyroidism, gout, essential hypertension, diastolic CHF, asthma, GERD came to the hospital complains of shortness of breath and swelling.  During the hospitalization patient was diuresed but creatinine started to trend up therefore nephrology team was consulted as well. Patient was seen by Cardiology and thought he was in cardiorenal syndrome.  Despite of careful management of diuretics and fluid status, his kidney numbers continue to increase.  Patient was seen by palliative care and eventually he was made DNR/DNI and family opted to take him home with home hospice. Patient will be transition to home hospice with recommendations as stated above.  Wife at the bedside on the day of discharge, answered all the questions for the patient and the family members.   Discharge Diagnoses:  Active Problems:   Benign essential hypertension   COPD exacerbation (HCC)   Esophageal reflux   Chronic kidney disease (CKD) stage G3b/A2, moderately decreased glomerular filtration rate (GFR) between 30-44 mL/min/1.73 square meter and albuminuria creatinine ratio between 30-299 mg/g (HCC)   Persistent atrial fibrillation   Hypothyroidism   Diabetes mellitus with renal complications (HCC)   Shortness of  breath   Goals of care, counseling/discussion   Acute on chronic congestive heart failure (HCC)   Acute on chronic diastolic (congestive) heart failure ([place])   Palliative care by specialist  Acute on chronic diastolic congestive heart failure, ejection fraction 50-55%, class III Uremic encephalopathy secondary to worsening renal function AKI on CKD stage III -Continues to increase this morning.  Nephrology team is following.  Await their further recommendations.  Concerned about some component of cardiorenal syndrome.    Continues to rise, this morning is 5.34.  BUN is elevated 115. -Echocardiogram showed ejection fraction 50-55% he is following.  Right heart catheterization showed elevated right-sided filling pressures -Allopurinol dose has been reduced -Patient will be discharged home on torsemide 30 mg twice daily along with extra dose as needed.  Persistent atrial fibrillation. Supratherapeutic INR -Currently patient is rate controlled, continue Coumadin.  Pharmacy to dose this.  Target INR 2.0-3.0.  INR today is 3.5. I have advised family that moving forward if they do not want to take Coumadin it will be at their discretion.  This will avoid frequent blood checks.  Diabetes mellitus type 2 - Continue sliding scale and Accu-Chek.  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  Constipation -Scheduled MiraLAX and senna.  Added lactulose Transition home with home hospice.  Had extensive discussion with the patient's family during the hospitalization and also on the day of discharge. If patient wants, he can go home with [facility] for comfort measures  Patient on Coumadin for DVT prophylaxis with supratherapeutic INR today.  Advised to  hold off on Coumadin  today.   Discharge Instructions   Allergies as of 02/16/2018   No Known Allergies     Medication List    TAKE these medications   acetaminophen 325 MG tablet Commonly known as:  TYLENOL Take 650 mg by mouth every 6 (six) hours as needed for mild pain.   albuterol (2.5 MG/3ML) 0.083% nebulizer solution Commonly known as:  PROVENTIL INHALE CONTENTS OF 1 VIAL IN NEBULIZER EVERY 4 HOURS AS NEEDED FOR WHEEZING AND ASTHMA What changed:  See the new instructions.   allopurinol 300 MG tablet Commonly known as:  ZYLOPRIM TAKE 1 TABLET BY MOUTH EVERY DAY   ALPRAZolam 0.5 MG tablet Commonly known as:  XANAX Take 1 tablet (0.5 mg total) by mouth 3 (three) times daily as needed for up to 20 doses for sleep or anxiety.   bisoprolol 5 MG tablet Commonly known as:  ZEBETA Take 1 tablet (5 mg total) by mouth daily for 30 days.   calcitRIOL 0.25 MCG capsule Commonly known as:  ROCALTROL Take 0.25-0.5 mcg by mouth See admin instructions. Take 0.25 mcg by mouth every other day, alternating with 0.5 mcg   CENTRUM SILVER ADULT 50+ PO Take 1 tablet by mouth daily.   fluticasone 50 MCG/ACT nasal spray Commonly known as:  FLONASE SPRAY 2 SPRAYS INTO EACH NOSTRIL EVERY DAY What changed:  See the new instructions.   isosorbide mononitrate 30 MG 24 hr tablet Commonly known as:  IMDUR Take 1 tablet (30 mg total) by mouth daily.   KLOR-CON M20 20 MEQ tablet Generic drug:  potassium chloride SA TAKE 1 TABLET BY MOUTH EVERY DAY What changed:  how much to take   levothyroxine 175 MCG tablet Commonly known as:  SYNTHROID, LEVOTHROID TAKE 1 TABLET EVERY DAY BEFORE BREAKFAST What changed:  See the new instructions.   oxycodone 5 MG capsule Commonly known as:  OXY-IR Take 1 capsule (5 mg total) by mouth every 6 (six) hours as needed for up to 20 doses.   OXYGEN Inhale 2 L into the lungs continuous.   pantoprazole 40 MG tablet Commonly known as:  PROTONIX Take 1 tablet (40 mg total) by  mouth daily.   polyethylene glycol packet Commonly known as:  MIRALAX / GLYCOLAX Take 17 g by mouth daily as needed for moderate constipation or severe constipation.   rosuvastatin 20 MG tablet Commonly known as:  CRESTOR Take 1 tablet (20 mg total) by mouth daily.   senna-docusate 8.6-50 MG tablet Commonly known as:  Senokot-S Take 1 tablet by mouth at bedtime as needed for up to 30 days for mild constipation.   torsemide 20 MG tablet Commonly known as:  DEMADEX Take 1.5 tablets (30 mg total) by mouth 2 (two) times daily for 30 days. Take 30mg  po twice daily, may take additional 30mg  orally if necessary for edema and shortness of breath What changed:    how much to take  when to take this  additional instructions   umeclidinium-vilanterol 62.5-25 MCG/INH Aepb Commonly known as:  ANORO ELLIPTA Inhale 1 puff into the lungs daily.   UNABLE TO FIND CPAP- At bedtime and during all naps   warfarin 5 MG tablet Commonly known as:  COUMADIN Take as directed. If you are unsure how to take this medication, talk to your nurse or doctor. Original instructions:  TAKE 1/2 TO 1 TABLET BY MOUTH DAILY AS DIRECTED BY COUMADIN CLINIC What changed:  See the new instructions.  Durable Medical Equipment  (From admission, onward)         Start     Ordered   02/14/18 1211  For home use only DME 4 wheeled rolling walker with seat  Once    Question:  Patient needs a walker to treat with the following condition  Answer:  Mobility impaired   02/14/18 [facility] Follow up.   Contact information: [address] [place] [address] [phone]        Home, [facility] Follow up.   Specialty:  Home Health Services Why:  Registered Nurse, physical,occupational therapist, nurse aide and social worker Contact information: [place] [address] [place], [place], [place]. Schedule an appointment as soon as possible for a visit in 1 week(s).   Specialty:  Geriatric Medicine Contact information: [place] [address] [phone]        Troy Sine, MD .   Specialty:  Cardiology Contact information: [address] [place] [address] [phone]          No Known Allergies  You were cared for by a hospitalist during your hospital stay. If you have any questions about your discharge medications or the care you received while you were in the hospital after you are discharged, you can call the unit and asked to speak with the hospitalist on call if the hospitalist that took care of you is not available. Once you are discharged, your primary care physician will handle any further medical issues. Please note that no refills for any discharge medications will be authorized once you are discharged, as it is imperative that you return to your primary care physician (or establish a relationship with a primary care physician if you do not have one) for your aftercare needs so that they can reassess your need for medications and monitor your lab values.  Consultations:  Cardiology  Nephrology  Palliative care service   Procedures/Studies: Dg Chest 2 View  Result Date: 02/04/2018 CLINICAL DATA:  Shortness of breath EXAM: CHEST - 2 VIEW COMPARISON:  02/02/2018 FINDINGS: Cardiomegaly with vascular congestion. Large right pleural effusion with right lower lobe atelectasis or infiltrate. No focal opacity on the left. No acute bony abnormality. IMPRESSION: Continued large right effusion with right lower lobe atelectasis or infiltrate. Cardiomegaly, vascular congestion. Electronically Signed   By: Rolm Baptise M.D.   On: 02/04/2018 13:15   Dg Chest 2 View  Result Date: 02/02/2018 CLINICAL DATA:  Dyspnea. EXAM: CHEST - 2 VIEW COMPARISON:  Radiograph of January 31, 2018. FINDINGS: Stable cardiomegaly. Atherosclerosis of  thoracic aorta is noted. No pneumothorax is noted. Left lung is clear. Moderate right pleural effusion is noted with probable underlying atelectasis or infiltrate. Bony thorax is unremarkable. IMPRESSION: Moderate right pleural effusion with probable underlying atelectasis or infiltrate. Aortic Atherosclerosis (ICD10-I70.0). Electronically Signed   By: Marijo Conception, M.D.   On: 02/02/2018 19:40   Ct Head Wo Contrast  Result Date: 02/07/2018 CLINICAL DATA:  Unexplained altered level of consciousness EXAM: CT HEAD WITHOUT CONTRAST TECHNIQUE: Contiguous axial images were obtained from the base of the skull through the vertex without intravenous contrast. COMPARISON:  12/02/2017 FINDINGS: Brain: No evidence of acute infarction, hemorrhage, hydrocephalus, extra-axial collection or mass lesion/mass effect. On a few slices the variable density  of the pons is attributed to streak artifact based on reformats. Age normal brain volume. Vascular: Atherosclerotic calcification Skull: Negative Sinuses/Orbits: Bilateral cataract resection. IMPRESSION: Unremarkable head CT for age. Electronically Signed   By: Monte Fantasia M.D.   On: 02/07/2018 15:20   Nm Tumor Localization W Spect  Result Date: 02/11/2018 CLINICAL DATA:  HEART FAILURE. CONCERN FOR CARDIAC AMYLOIDOSIS. 78 year old male. Acute on chronic diastolic congestive heart failure. EXAM: NUCLEAR MEDICINE TUMOR LOCALIZATION. PYP CARDIAC AMYLOIDOSIS SCAN WITH SPECT TECHNIQUE: Following intravenous administration of radiopharmaceutical, anterior planar images of the chest were obtained. Regions of interest were placed on the heart and contralateral chest wall for quantitative assessment. Additional SPECT imaging of the chest was obtained. RADIOPHARMACEUTICALS:  Stone mCi TECHNETIUM 99 PYROPHOSPHATE FINDINGS: Planar Visual assessment: Anterior planar imaging demonstrates radiotracer uptake within the heart less than uptake within the adjacent ribs (Grade 1).  Quantitative assessment : Quantitative assessment of the cardiac uptake compared to the contralateral chest wall is equal to 1.1 (H/CL = 1.1). SPECT assessment: SPECT imaging of the chest demonstrates minimal radiotracer accumulation within the LEFT ventricle. 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 Electronically Signed   By: Suzy Bouchard M.D.   On: 02/11/2018 07:53   Dg Chest Portable 1 View  Result Date: 01/31/2018 CLINICAL DATA:  78 year old male with shortness of breath for the past 2-3 days EXAM: PORTABLE CHEST 1 VIEW COMPARISON:  Prior chest x-ray 01/15/2018 FINDINGS: Marked cardiomegaly which likely appears exaggerated given portable frontal technique. Mediastinal widening is similar compared to prior. Atherosclerotic calcifications again noted in the transverse aorta. Marked pulmonary vascular congestion with mild interstitial pulmonary edema. Moderately large and potentially partially loculated right pleural effusion has increased compared to prior. No evidence of pneumothorax. No acute osseous abnormality. IMPRESSION: 1. Slightly worsened pulmonary vascular congestion now with mild interstitial edema. 2. Enlarging and likely partially loculated right pleural effusion. 3. Similar degree of cardiomegaly and Aortic Atherosclerosis (ICD10-170.0) Electronically Signed   By: Jacqulynn Cadet M.D.   On: 01/31/2018 12:49      Subjective: Patient is awake this morning with wife at bedside.  Does not have any new complaints. Hospitalist team was in evaluating the patient and arranging for any home needs.   Discharge Exam: Vitals:   02/16/18 0800 02/16/18 0920  BP: 104/80   Pulse: 65   Resp: 17   Temp: 98 F (36.7 C)   SpO2: 100% 98%   Vitals:   02/16/18 0355 02/16/18 0500 02/16/18 0800 02/16/18 0920  BP:  116/77 104/80   Pulse: 72  65   Resp:  (!) 25 17   Temp: 98.1 F (36.7 C)  98 F (36.7 C)    TempSrc: Oral  Oral   SpO2:   100% 98%  Weight:  92.8 kg    Height:        General: Pt is alert, awake, not in acute distress Cardiovascular: RRR, S1/S2 +, no rubs, no gallops Respiratory: CTA bilaterally, no wheezing, no rhonchi Abdominal: Soft, NT, ND, bowel sounds + Extremities: no edema, no cyanosis Generally appears very weak   The results of significant diagnostics from this hospitalization (including imaging, microbiology, ancillary and laboratory) are listed below for reference.     Microbiology: No results found for this or any previous visit (from the past 240 hour(s)).   Labs: BNP (last 3 results) Recent Labs    08/29/17 0306 11/24/17 0147 01/31/18 1227  BNP 438.9* 538.4* 494.5*  Basic Metabolic Panel: Recent Labs  Lab 02/10/18 0234  02/12/18 0228 02/13/18 0228 02/14/18 0249 02/15/18 0233 02/16/18 0813  NA 139   < > 138 139 138 140 139  K 3.7   < > 3.6 3.9 3.7 4.0 3.9  CL 88*   < > 85* 86* 88* 88* 87*  CO2 44*   < > 38* 42* 38* 37* 38*  GLUCOSE 93   < > 91 98 87 107* 100*  BUN 81*   < > 79* 90* 97* 106* 115*  CREATININE 3.10*   < > 3.82* 4.01* 4.52* 5.02* 5.34*  CALCIUM 9.7   < > 9.9 10.2 10.1 10.4* 10.3  MG  --   --   --  3.1* 3.2* 3.6*  --   PHOS 3.9  --   --   --   --   --   --    < > = values in this interval not displayed.   Liver Function Tests: No results for input(s): AST, ALT, ALKPHOS, BILITOT, PROT, ALBUMIN in the last 168 hours. No results for input(s): LIPASE, AMYLASE in the last 168 hours. No results for input(s): AMMONIA in the last 168 hours. CBC: Recent Labs  Lab 02/10/18 0234 02/11/18 0237 02/12/18 0228 02/13/18 0228 02/16/18 0813  WBC 5.3 4.8 5.3 5.9 5.9  NEUTROABS 3.9 3.3 3.6 4.3  --   HGB 10.6* 10.8* 11.0* 11.5* 11.8*  HCT 36.4* 36.8* 36.9* 38.8* 39.9  MCV 109.3* 107.3* 105.7* 106.6* 106.1*  PLT 151 161 148* 186 203   Cardiac Enzymes: No results for input(s): CKTOTAL, CKMB, CKMBINDEX, TROPONINI in the last 168  hours. BNP: Invalid input(s): POCBNP CBG: Recent Labs  Lab 02/13/18 1829  GLUCAP 98   D-Dimer No results for input(s): DDIMER in the last 72 hours. Hgb A1c No results for input(s): HGBA1C in the last 72 hours. Lipid Profile No results for input(s): CHOL, HDL, LDLCALC, TRIG, CHOLHDL, LDLDIRECT in the last 72 hours. Thyroid function studies No results for input(s): TSH, T4TOTAL, T3FREE, THYROIDAB in the last 72 hours.  Invalid input(s): FREET3 Anemia work up No results for input(s): VITAMINB12, FOLATE, FERRITIN, TIBC, IRON, RETICCTPCT in the last 72 hours. Urinalysis    Component Value Date/Time   COLORURINE YELLOW 10/27/2016 0113   APPEARANCEUR CLEAR 10/27/2016 0113   LABSPEC 1.013 10/27/2016 0113   PHURINE 6.0 10/27/2016 0113   GLUCOSEU NEGATIVE 10/27/2016 0113   HGBUR NEGATIVE 10/27/2016 0113   BILIRUBINUR NEGATIVE 10/27/2016 0113   KETONESUR NEGATIVE 10/27/2016 0113   PROTEINUR 100 (A) 10/27/2016 0113   UROBILINOGEN 0.2 06/17/2012 0622   NITRITE NEGATIVE 10/27/2016 0113   LEUKOCYTESUR NEGATIVE 10/27/2016 0113   Sepsis Labs Invalid input(s): PROCALCITONIN,  WBC,  LACTICIDVEN Microbiology No results found for this or any previous visit (from the past 240 hour(s)).   Time coordinating discharge:  I have spent 35 minutes face to face with the patient and on the ward discussing the patients care, assessment, plan and disposition with other care givers. >50% of the time was devoted counseling the patient about the risks and benefits of treatment/Discharge disposition and coordinating care.   SIGNED:   Damita Lack, MD  [facility] 02/16/2018, 10:16 AM Pager   If 7PM-7AM, please contact night-coverage [email or web address] Password [email or web address]

## 2018-02-16 NOTE — Progress Notes (Signed)
ANTICOAGULATION CONSULT NOTE - Follow up [place] for coumadin Indication: atrial fibrillation  No Known Allergies  Patient Measurements: Height: 5\' 9"  (175.3 cm) Weight: 204 lb 9.4 oz (92.8 kg) IBW/kg (Calculated) : 70.7  Vital Signs: Temp: 97.8 F (36.6 C) (01/12 1150) Temp Source: Oral (01/12 1150) BP: 101/65 (01/12 1150) Pulse Rate: 62 (01/12 1150)  Labs: Recent Labs    02/14/18 0249 02/15/18 0233 02/16/18 0813  HGB  --   --  11.8*  HCT  --   --  39.9  PLT  --   --  203  LABPROT 28.2* 30.7* 34.8*  INR 2.69 3.00 3.53  CREATININE 4.52* 5.02* 5.34*    Estimated Creatinine Clearance: 12.8 mL/min (A) (by C-G formula based on SCr of 5.34 mg/dL (H)).   Medical History: Past Medical History:  Diagnosis Date  . Acute kidney injury superimposed on chronic kidney disease ([place]) 11/2016  . Acute respiratory failure ([place]) 11/2016  . Allergy   . Asthma   . Atrial fibrillation ([place])   . 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 Benign essential hypertension   . CHF (congestive heart failure) ([place])   . CKD (chronic kidney disease), stage III ([place])   . COPD (chronic obstructive pulmonary disease) ([place])   . Diabetes ([place])   . Diabetes mellitus without complication ([place])   . Dyspnea   . Gastritis   . GERD (gastroesophageal reflux disease)   . Gout attack 09/2012  . Hypertension   . Sinusitis, maxillary, chronic   . Thyroid disease   . Type II or unspecified type diabetes mellitus without mention of complication, not stated as uncontrolled     Assessment: 24 yoM admitted with SOB and swelling. Pt on warfarin at home for AFib.   INR supra- therapeutic at 3.5 big jump with higher than pta doses. Hgb 11.5, plt 186. No s/sx of bleeding documented.   Supposed to go home with hospice  Home dose: 2.5mg  daily except for 5mg  on Mon/Fri  Goal of Therapy:  INR 2-3 Monitor platelets by anticoagulation protocol: Yes   Plan:  -Hold Warfarin  today  If dc home and warfarin continues would restart on Tues with 2.5mg  daily -Daily INR for now  [facility] Pharm.D. CPP, BCPS Clinical Pharmacist [phone] 02/16/2018 3:59 PM

## 2018-02-16 NOTE — Progress Notes (Signed)
[facility])  Hospice eligibility has been confirmed by hospice physician.   DME discussed with patient and spouse.  Will order RW, 3N1, Tub seat with back, OBT, Oxygen and CPAP from [facility] to be delivered to home 02/17/18. Patient already has oxygen and CPAP in the home through [facility].   Provided patient and spouse with contact information during this visit.   Discharge summary has been sent to Referral Center.   Referral Center will contact spouse tomorrow to confirm time of visit.   Appreciate help from [facility] with referral process and CPAP settings.   Thank you,  Erling Conte, LCSW [phone]

## 2018-02-16 NOTE — Progress Notes (Signed)
Patient is ready for discharge. He is alert and oriented, vital signs are stable. He has been taken off telemetry, CCMD notified. IV has been removed with no complications, catheter intact. I have reviewed discharge instructions with patient's wife and answered all questions regarding his discharge. Patient will be transported home by [facility]. Discharge Paperwork has been given to PTAR and Patient's wife. Wife will meet PTAR at her residence.

## 2018-02-17 ENCOUNTER — Telehealth: Payer: Self-pay | Admitting: *Deleted

## 2018-02-17 ENCOUNTER — Encounter: Payer: Self-pay | Admitting: Physician Assistant

## 2018-02-17 NOTE — Telephone Encounter (Signed)
TOC call, Discharge date 02/16/18.  LMOM for patient to return call.

## 2018-02-18 NOTE — Telephone Encounter (Signed)
Transition Care Management Follow-up Telephone Call  Date of discharge and from where: 02/16/18   How have you been since you were released from the hospital? He is doing better, but not as good as should be, still weak but has Hospice coming in.     Any questions or concerns? No  but has been getting feedback from Hospice  Items Reviewed:  Did the pt receive and understand the discharge instructions provided? Yes   Medications obtained and verified? no  Any new allergies since your discharge? No   Dietary orders reviewed? Yes  Do you have support at home? Yes  Wife and Hospice Other (ie: DME, Home Health, etc) Hospice   Functional Questionnaire: (I = Independent and D = Dependent) ADL's: D- on wife  Bathing/Dressing- D- on wife   Meal Prep- D- on wife  Eating- I-with some assistance  Maintaining continence- Using Depends  Transferring/Ambulation-  I- with Asistance  Managing Meds- D on wife   Follow up appointments reviewed:    PCP Hospital f/u appt confirmed? Yes Scheduled to see Dinah on 02/27/18 @ [facility] f/u appt confirmed? no Specialist appointments.  Are transportation arrangements needed? No   If their condition worsens, is the pt aware to call  their PCP or go to the ED? Yes  Was the patient provided with contact information for the PCP's office or ED? Yes  Was the pt encouraged to call back with questions or concerns? Yes

## 2018-02-19 ENCOUNTER — Telehealth: Payer: Self-pay

## 2018-02-19 NOTE — Telephone Encounter (Signed)
Called to schedule overdue inr left msg

## 2018-02-21 ENCOUNTER — Telehealth: Payer: Self-pay | Admitting: *Deleted

## 2018-02-21 MED ORDER — BISOPROLOL FUMARATE 5 MG PO TABS: ORAL_TABLET | ORAL | 1 refills | Status: DC

## 2018-02-21 MED ORDER — ALPRAZOLAM 0.5 MG PO TABS: 0.5000 mg | ORAL_TABLET | Freq: Three times a day (TID) | ORAL | 0 refills | Status: DC | PRN

## 2018-02-21 MED ORDER — SENNOSIDES-DOCUSATE SODIUM 8.6-50 MG PO TABS: ORAL_TABLET | ORAL | 1 refills | Status: DC

## 2018-02-21 MED ORDER — POLYETHYLENE GLYCOL 3350 17 G PO PACK: 17.0000 g | PACK | Freq: Every day | ORAL | 1 refills | Status: DC | PRN

## 2018-02-21 MED ORDER — CALCITRIOL 0.25 MCG PO CAPS: ORAL_CAPSULE | ORAL | 1 refills | Status: DC

## 2018-02-21 NOTE — Telephone Encounter (Signed)
Agree that cardiology needs to be aware (plus he may have had INR check appts and they don't need to be getting charged for missed visits)

## 2018-02-21 NOTE — Telephone Encounter (Signed)
I would have preferred to see him at least once since I am providing his prescriptions if he would be able to come here.  Please make that recommendation.  If it's not possible, it's ok to give the prescriptions as indicated.

## 2018-02-21 NOTE — Telephone Encounter (Signed)
Spoke with hospice nurse and advised results. Per nurse wife can't get pt in the car so they will cancel appt. Also wife decided to cancel the coumadin due to pt having nose bleeds and stated she will not give pt anymore. I advised she may need to call cardiology because they are following pt on coumadin.

## 2018-02-21 NOTE — Telephone Encounter (Signed)
Omar Lambert, with Hospice called and stated that they are now coming in to see patient. Stated that patient was released from the hospital with updated medication list but no Rx's. Stated patient needed a 15 day supply (can only get 15day supply with Hospice) for:  -Alprazolam .5mg  one by mouth three times daily as needed for anxiety/sleep  -Senokot-s one at bedtime as needed for mild constipation  -Zebeta 5mg  one daily for hypertension  -Calcitriol 0.91mcg one every other day alternating with 0.19mcg  -Miralax 17g daily as needed for moderate/severe constipation.   **patient wants to DISCONTINUE taking Crestor and Coumadin.  Please Advise.   Patient also canceled appointment for Thursday's [facility] follow up due to seeing Hospice in home.   Pended Rx's and sent to Dr. Mariea Clonts for approval.

## 2018-02-24 ENCOUNTER — Telehealth: Payer: Self-pay | Admitting: *Deleted

## 2018-02-24 NOTE — Telephone Encounter (Signed)
Wife, Earlie Server dropped off FMLA Paperwork to be completed.  [facility] [phone] Placed in Dr. Cyndi Lennert folder to review,fill out and sign. Call Wife when ready for pick up [phone]

## 2018-02-25 DIAGNOSIS — M109 Gout, unspecified: Secondary | ICD-10-CM | POA: Diagnosis not present

## 2018-02-25 DIAGNOSIS — E039 Hypothyroidism, unspecified: Secondary | ICD-10-CM | POA: Diagnosis not present

## 2018-02-25 DIAGNOSIS — I509 Heart failure, unspecified: Secondary | ICD-10-CM | POA: Diagnosis not present

## 2018-02-25 DIAGNOSIS — N2581 Secondary hyperparathyroidism of renal origin: Secondary | ICD-10-CM | POA: Diagnosis not present

## 2018-02-25 DIAGNOSIS — E1122 Type 2 diabetes mellitus with diabetic chronic kidney disease: Secondary | ICD-10-CM | POA: Diagnosis not present

## 2018-02-25 DIAGNOSIS — N185 Chronic kidney disease, stage 5: Secondary | ICD-10-CM | POA: Diagnosis not present

## 2018-02-25 DIAGNOSIS — D631 Anemia in chronic kidney disease: Secondary | ICD-10-CM | POA: Diagnosis not present

## 2018-02-25 DIAGNOSIS — I120 Hypertensive chronic kidney disease with stage 5 chronic kidney disease or end stage renal disease: Secondary | ICD-10-CM | POA: Diagnosis not present

## 2018-02-25 DIAGNOSIS — I4891 Unspecified atrial fibrillation: Secondary | ICD-10-CM | POA: Diagnosis not present

## 2018-02-27 ENCOUNTER — Encounter: Payer: Self-pay | Admitting: Family

## 2018-03-03 ENCOUNTER — Encounter (HOSPITAL_COMMUNITY): Payer: Self-pay | Admitting: Emergency Medicine

## 2018-03-03 ENCOUNTER — Inpatient Hospital Stay (HOSPITAL_COMMUNITY)
Admission: EM | Admit: 2018-03-03 | Discharge: 2018-03-04 | DRG: 291 | Disposition: A | Discharge status: Hospice | Attending: Internal Medicine | Admitting: Internal Medicine

## 2018-03-03 ENCOUNTER — Emergency Department (HOSPITAL_COMMUNITY)

## 2018-03-03 DIAGNOSIS — Z7951 Long term (current) use of inhaled steroids: Secondary | ICD-10-CM | POA: Diagnosis not present

## 2018-03-03 DIAGNOSIS — Z825 Family history of asthma and other chronic lower respiratory diseases: Secondary | ICD-10-CM

## 2018-03-03 DIAGNOSIS — Z833 Family history of diabetes mellitus: Secondary | ICD-10-CM

## 2018-03-03 DIAGNOSIS — Z66 Do not resuscitate: Secondary | ICD-10-CM

## 2018-03-03 DIAGNOSIS — G9341 Metabolic encephalopathy: Secondary | ICD-10-CM | POA: Diagnosis present

## 2018-03-03 DIAGNOSIS — N184 Chronic kidney disease, stage 4 (severe): Secondary | ICD-10-CM

## 2018-03-03 DIAGNOSIS — Z79890 Hormone replacement therapy (postmenopausal): Secondary | ICD-10-CM | POA: Diagnosis not present

## 2018-03-03 DIAGNOSIS — I4819 Other persistent atrial fibrillation: Secondary | ICD-10-CM | POA: Diagnosis present

## 2018-03-03 DIAGNOSIS — F0390 Unspecified dementia without behavioral disturbance: Secondary | ICD-10-CM | POA: Diagnosis present

## 2018-03-03 DIAGNOSIS — E1122 Type 2 diabetes mellitus with diabetic chronic kidney disease: Secondary | ICD-10-CM | POA: Diagnosis not present

## 2018-03-03 DIAGNOSIS — J9622 Acute and chronic respiratory failure with hypercapnia: Secondary | ICD-10-CM

## 2018-03-03 DIAGNOSIS — I132 Hypertensive heart and chronic kidney disease with heart failure and with stage 5 chronic kidney disease, or end stage renal disease: Principal | ICD-10-CM | POA: Diagnosis present

## 2018-03-03 DIAGNOSIS — R0602 Shortness of breath: Secondary | ICD-10-CM | POA: Diagnosis present

## 2018-03-03 DIAGNOSIS — J9602 Acute respiratory failure with hypercapnia: Secondary | ICD-10-CM | POA: Diagnosis not present

## 2018-03-03 DIAGNOSIS — J9601 Acute respiratory failure with hypoxia: Secondary | ICD-10-CM | POA: Diagnosis present

## 2018-03-03 DIAGNOSIS — Z9981 Dependence on supplemental oxygen: Secondary | ICD-10-CM | POA: Diagnosis not present

## 2018-03-03 DIAGNOSIS — E1129 Type 2 diabetes mellitus with other diabetic kidney complication: Secondary | ICD-10-CM | POA: Diagnosis present

## 2018-03-03 DIAGNOSIS — J9621 Acute and chronic respiratory failure with hypoxia: Secondary | ICD-10-CM | POA: Diagnosis present

## 2018-03-03 DIAGNOSIS — R0689 Other abnormalities of breathing: Secondary | ICD-10-CM | POA: Diagnosis not present

## 2018-03-03 DIAGNOSIS — Z5181 Encounter for therapeutic drug level monitoring: Secondary | ICD-10-CM

## 2018-03-03 DIAGNOSIS — R404 Transient alteration of awareness: Secondary | ICD-10-CM | POA: Diagnosis not present

## 2018-03-03 DIAGNOSIS — Z79899 Other long term (current) drug therapy: Secondary | ICD-10-CM | POA: Diagnosis not present

## 2018-03-03 DIAGNOSIS — R627 Adult failure to thrive: Secondary | ICD-10-CM | POA: Diagnosis present

## 2018-03-03 DIAGNOSIS — J90 Pleural effusion, not elsewhere classified: Secondary | ICD-10-CM

## 2018-03-03 DIAGNOSIS — R000 Tachycardia, unspecified: Secondary | ICD-10-CM | POA: Diagnosis not present

## 2018-03-03 DIAGNOSIS — N185 Chronic kidney disease, stage 5: Secondary | ICD-10-CM | POA: Diagnosis present

## 2018-03-03 DIAGNOSIS — Z515 Encounter for palliative care: Secondary | ICD-10-CM | POA: Diagnosis present

## 2018-03-03 DIAGNOSIS — E039 Hypothyroidism, unspecified: Secondary | ICD-10-CM | POA: Diagnosis not present

## 2018-03-03 DIAGNOSIS — D649 Anemia, unspecified: Secondary | ICD-10-CM

## 2018-03-03 DIAGNOSIS — Z7901 Long term (current) use of anticoagulants: Secondary | ICD-10-CM

## 2018-03-03 DIAGNOSIS — N3001 Acute cystitis with hematuria: Secondary | ICD-10-CM

## 2018-03-03 DIAGNOSIS — I5033 Acute on chronic diastolic (congestive) heart failure: Secondary | ICD-10-CM | POA: Diagnosis present

## 2018-03-03 DIAGNOSIS — G4733 Obstructive sleep apnea (adult) (pediatric): Secondary | ICD-10-CM | POA: Diagnosis present

## 2018-03-03 DIAGNOSIS — Z9989 Dependence on other enabling machines and devices: Secondary | ICD-10-CM

## 2018-03-03 DIAGNOSIS — J441 Chronic obstructive pulmonary disease with (acute) exacerbation: Secondary | ICD-10-CM | POA: Diagnosis present

## 2018-03-03 DIAGNOSIS — J449 Chronic obstructive pulmonary disease, unspecified: Secondary | ICD-10-CM | POA: Diagnosis present

## 2018-03-03 DIAGNOSIS — R040 Epistaxis: Secondary | ICD-10-CM | POA: Diagnosis present

## 2018-03-03 DIAGNOSIS — R319 Hematuria, unspecified: Secondary | ICD-10-CM | POA: Diagnosis not present

## 2018-03-03 DIAGNOSIS — I10 Essential (primary) hypertension: Secondary | ICD-10-CM | POA: Diagnosis present

## 2018-03-03 LAB — TYPE AND SCREEN
ABO/RH(D): A POS
Antibody Screen: NEGATIVE

## 2018-03-03 LAB — CBC WITH DIFFERENTIAL/PLATELET
Abs Immature Granulocytes: 0.01 10*3/uL (ref 0.00–0.07)
BASOS ABS: 0 10*3/uL (ref 0.0–0.1)
Basophils Relative: 0 %
Eosinophils Absolute: 0.1 10*3/uL (ref 0.0–0.5)
Eosinophils Relative: 2 %
HCT: 36.8 % — ABNORMAL LOW (ref 39.0–52.0)
Hemoglobin: 10.5 g/dL — ABNORMAL LOW (ref 13.0–17.0)
Immature Granulocytes: 0 %
Lymphocytes Relative: 14 %
Lymphs Abs: 0.7 10*3/uL (ref 0.7–4.0)
MCH: 31.3 pg (ref 26.0–34.0)
MCHC: 28.5 g/dL — ABNORMAL LOW (ref 30.0–36.0)
MCV: 109.9 fL — ABNORMAL HIGH (ref 80.0–100.0)
Monocytes Absolute: 0.8 10*3/uL (ref 0.1–1.0)
Monocytes Relative: 16 %
NEUTROS ABS: 3.1 10*3/uL (ref 1.7–7.7)
Neutrophils Relative %: 68 %
Platelets: 135 10*3/uL — ABNORMAL LOW (ref 150–400)
RBC: 3.35 MIL/uL — ABNORMAL LOW (ref 4.22–5.81)
RDW: 16.6 % — ABNORMAL HIGH (ref 11.5–15.5)
WBC: 4.6 10*3/uL (ref 4.0–10.5)
nRBC: 1.3 % — ABNORMAL HIGH (ref 0.0–0.2)

## 2018-03-03 LAB — POCT I-STAT 7, (LYTES, BLD GAS, ICA,H+H)
ACID-BASE EXCESS: 3 mmol/L — AB (ref 0.0–2.0)
Bicarbonate: 31.6 mmol/L — ABNORMAL HIGH (ref 20.0–28.0)
Calcium, Ion: 1.25 mmol/L (ref 1.15–1.40)
HCT: 34 % — ABNORMAL LOW (ref 39.0–52.0)
Hemoglobin: 11.6 g/dL — ABNORMAL LOW (ref 13.0–17.0)
O2 Saturation: 98 %
Patient temperature: 97.4
Potassium: 5.1 mmol/L (ref 3.5–5.1)
Sodium: 141 mmol/L (ref 135–145)
TCO2: 34 mmol/L — ABNORMAL HIGH (ref 22–32)
pCO2 arterial: 66.9 mmHg (ref 32.0–48.0)
pH, Arterial: 7.278 — ABNORMAL LOW (ref 7.350–7.450)
pO2, Arterial: 110 mmHg — ABNORMAL HIGH (ref 83.0–108.0)

## 2018-03-03 LAB — URINALYSIS, ROUTINE W REFLEX MICROSCOPIC
Bilirubin Urine: NEGATIVE
GLUCOSE, UA: NEGATIVE mg/dL
Ketones, ur: NEGATIVE mg/dL
Nitrite: NEGATIVE
Protein, ur: 30 mg/dL — AB
RBC / HPF: >50 RBC/hpf — ABNORMAL HIGH (ref 0–5)
SPECIFIC GRAVITY, URINE: 1.012 (ref 1.005–1.030)
WBC, UA: >50 WBC/hpf — ABNORMAL HIGH (ref 0–5)
pH: 5 (ref 5.0–8.0)

## 2018-03-03 LAB — I-STAT TROPONIN, ED: Troponin i, poc: 0.08 ng/mL (ref 0.00–0.08)

## 2018-03-03 LAB — BASIC METABOLIC PANEL
Anion gap: 9 (ref 5–15)
BUN: 82 mg/dL — ABNORMAL HIGH (ref 8–23)
CHLORIDE: 103 mmol/L (ref 98–111)
CO2: 30 mmol/L (ref 22–32)
Calcium: 9.5 mg/dL (ref 8.9–10.3)
Creatinine, Ser: 3.72 mg/dL — ABNORMAL HIGH (ref 0.61–1.24)
GFR calc Af Amer: 17 mL/min — ABNORMAL LOW (ref >60)
GFR calc non Af Amer: 15 mL/min — ABNORMAL LOW (ref >60)
Glucose, Bld: 103 mg/dL — ABNORMAL HIGH (ref 70–99)
Potassium: 5.2 mmol/L — ABNORMAL HIGH (ref 3.5–5.1)
Sodium: 142 mmol/L (ref 135–145)

## 2018-03-03 LAB — GLUCOSE, CAPILLARY
GLUCOSE-CAPILLARY: 88 mg/dL (ref 70–99)
GLUCOSE-CAPILLARY: 100 mg/dL — AB (ref 70–99)
Glucose-Capillary: 83 mg/dL (ref 70–99)
Glucose-Capillary: 79 mg/dL (ref 70–99)

## 2018-03-03 LAB — ABO/RH: ABO/RH(D): A POS

## 2018-03-03 LAB — MRSA PCR SCREENING: MRSA by PCR: NEGATIVE

## 2018-03-03 LAB — BRAIN NATRIURETIC PEPTIDE: B NATRIURETIC PEPTIDE 5: 1045.3 pg/mL — AB (ref 0.0–100.0)

## 2018-03-03 LAB — PROTIME-INR
INR: 1.49
Prothrombin Time: 17.9 seconds — ABNORMAL HIGH (ref 11.4–15.2)

## 2018-03-03 MED ORDER — ONDANSETRON HCL 4 MG PO TABS: 4.0000 mg | ORAL_TABLET | Freq: Four times a day (QID) | ORAL | Status: DC | PRN

## 2018-03-03 MED ORDER — PANTOPRAZOLE SODIUM 40 MG PO PACK
40.0000 mg | PACK | Freq: Every day | ORAL | Status: DC
  Administered 2018-03-04: 40 mg via ORAL
  Filled 2018-03-03: qty 20

## 2018-03-03 MED ORDER — ALBUTEROL SULFATE (2.5 MG/3ML) 0.083% IN NEBU: 2.5000 mg | INHALATION_SOLUTION | Freq: Two times a day (BID) | RESPIRATORY_TRACT | Status: DC

## 2018-03-03 MED ORDER — SODIUM CHLORIDE 0.9 % IV SOLN
1.0000 g | INTRAVENOUS | Status: DC
  Administered 2018-03-04: 1 g via INTRAVENOUS
  Filled 2018-03-03: qty 10

## 2018-03-03 MED ORDER — ROSUVASTATIN CALCIUM 20 MG PO TABS
20.0000 mg | ORAL_TABLET | Freq: Every day | ORAL | Status: DC
  Administered 2018-03-03 – 2018-03-04 (×2): 20 mg via ORAL
  Filled 2018-03-03 (×2): qty 1

## 2018-03-03 MED ORDER — IPRATROPIUM BROMIDE 0.02 % IN SOLN: 0.5000 mg | Freq: Two times a day (BID) | RESPIRATORY_TRACT | Status: DC

## 2018-03-03 MED ORDER — IPRATROPIUM BROMIDE 0.02 % IN SOLN
0.5000 mg | Freq: Four times a day (QID) | RESPIRATORY_TRACT | Status: DC
  Administered 2018-03-03: 0.5 mg via RESPIRATORY_TRACT
  Filled 2018-03-03: qty 2.5

## 2018-03-03 MED ORDER — BISOPROLOL FUMARATE 5 MG PO TABS
5.0000 mg | ORAL_TABLET | Freq: Every day | ORAL | Status: DC
  Administered 2018-03-03 – 2018-03-04 (×2): 5 mg via ORAL
  Filled 2018-03-03 (×2): qty 1

## 2018-03-03 MED ORDER — ACETAMINOPHEN 650 MG RE SUPP: 650.0000 mg | Freq: Four times a day (QID) | RECTAL | Status: DC | PRN

## 2018-03-03 MED ORDER — ONDANSETRON HCL 4 MG/2ML IJ SOLN: 4.0000 mg | Freq: Four times a day (QID) | INTRAMUSCULAR | Status: DC | PRN

## 2018-03-03 MED ORDER — ALBUTEROL SULFATE (2.5 MG/3ML) 0.083% IN NEBU
2.5000 mg | INHALATION_SOLUTION | Freq: Four times a day (QID) | RESPIRATORY_TRACT | Status: DC
  Administered 2018-03-03: 2.5 mg via RESPIRATORY_TRACT
  Filled 2018-03-03: qty 3

## 2018-03-03 MED ORDER — ISOSORBIDE DINITRATE 5 MG PO TABS: 5.0000 mg | ORAL_TABLET | Freq: Three times a day (TID) | ORAL | Status: DC

## 2018-03-03 MED ORDER — SODIUM CHLORIDE 0.9 % IV SOLN
1.0000 g | Freq: Once | INTRAVENOUS | Status: AC
  Administered 2018-03-03: 1 g via INTRAVENOUS
  Filled 2018-03-03: qty 10

## 2018-03-03 MED ORDER — PANTOPRAZOLE SODIUM 40 MG PO PACK
40.0000 mg | PACK | Freq: Every day | ORAL | Status: DC
  Administered 2018-03-03: 40 mg
  Filled 2018-03-03: qty 20

## 2018-03-03 MED ORDER — ISOSORBIDE DINITRATE 20 MG PO TABS
10.0000 mg | ORAL_TABLET | Freq: Three times a day (TID) | ORAL | Status: DC
  Administered 2018-03-03: 10 mg via ORAL
  Filled 2018-03-03 (×2): qty 0.5

## 2018-03-03 MED ORDER — LEVOTHYROXINE SODIUM 175 MCG PO TABS
175.0000 ug | ORAL_TABLET | Freq: Every day | ORAL | Status: DC
  Administered 2018-03-04: 175 ug via ORAL
  Filled 2018-03-03 (×2): qty 1

## 2018-03-03 MED ORDER — FUROSEMIDE 10 MG/ML IJ SOLN
40.0000 mg | Freq: Once | INTRAMUSCULAR | Status: AC
  Administered 2018-03-03: 40 mg via INTRAVENOUS
  Filled 2018-03-03: qty 4

## 2018-03-03 MED ORDER — ACETAMINOPHEN 325 MG PO TABS: 650.0000 mg | ORAL_TABLET | Freq: Four times a day (QID) | ORAL | Status: DC | PRN

## 2018-03-03 MED ORDER — TORSEMIDE 20 MG PO TABS
30.0000 mg | ORAL_TABLET | Freq: Two times a day (BID) | ORAL | Status: DC
  Administered 2018-03-03 – 2018-03-04 (×3): 30 mg via ORAL
  Filled 2018-03-03 (×3): qty 2

## 2018-03-03 MED ORDER — ALBUTEROL SULFATE (2.5 MG/3ML) 0.083% IN NEBU: 2.5000 mg | INHALATION_SOLUTION | RESPIRATORY_TRACT | Status: DC | PRN

## 2018-03-03 MED ORDER — PANTOPRAZOLE SODIUM 40 MG PO TBEC
40.0000 mg | DELAYED_RELEASE_TABLET | Freq: Every day | ORAL | Status: DC
  Filled 2018-03-03: qty 1

## 2018-03-03 MED ORDER — SENNOSIDES-DOCUSATE SODIUM 8.6-50 MG PO TABS
1.0000 | ORAL_TABLET | Freq: Every day | ORAL | Status: DC
  Administered 2018-03-04: 1 via ORAL
  Filled 2018-03-03: qty 1

## 2018-03-03 MED ORDER — CALCITRIOL 0.25 MCG PO CAPS
0.2500 ug | ORAL_CAPSULE | ORAL | Status: DC
  Administered 2018-03-03: 0.25 ug via ORAL
  Filled 2018-03-03: qty 1

## 2018-03-03 MED ORDER — ALLOPURINOL 300 MG PO TABS
300.0000 mg | ORAL_TABLET | Freq: Every day | ORAL | Status: DC
  Administered 2018-03-03 – 2018-03-04 (×2): 300 mg via ORAL
  Filled 2018-03-03 (×2): qty 1

## 2018-03-03 MED ORDER — INSULIN ASPART 100 UNIT/ML ~~LOC~~ SOLN: 0.0000 [IU] | Freq: Three times a day (TID) | SUBCUTANEOUS | Status: DC

## 2018-03-03 MED ORDER — IPRATROPIUM-ALBUTEROL 0.5-2.5 (3) MG/3ML IN SOLN
3.0000 mL | Freq: Two times a day (BID) | RESPIRATORY_TRACT | Status: DC
  Administered 2018-03-03 – 2018-03-04 (×2): 3 mL via RESPIRATORY_TRACT
  Filled 2018-03-03 (×2): qty 3

## 2018-03-03 MED ORDER — LIDOCAINE HCL URETHRAL/MUCOSAL 2 % EX GEL: 1.0000 "application " | Freq: Once | CUTANEOUS | Status: DC

## 2018-03-03 MED ORDER — ISOSORBIDE MONONITRATE ER 30 MG PO TB24
30.0000 mg | ORAL_TABLET | Freq: Every day | ORAL | Status: DC
  Filled 2018-03-03: qty 1

## 2018-03-03 NOTE — H&P (Signed)
History and Physical    AKIEM URIETA TIW:[id number] DOB: 10/15/1940 DOA: 03/03/2018  PCP: Gayland Curry, DO  Patient coming from: Home.  History obtained from patient's wife.  Patient is encephalopathic.  Chief Complaint: Hematuria.  HPI: Omar Lambert is a 78 y.o. male with history of chronic kidney disease stage IV-V, diastolic CHF, atrial fibrillation, chronic anemia, diabetes mellitus type 2, gout, hypothyroidism, anemia, who was recently admitted and discharged 2 weeks ago after being admitted for neutropenia secondary to CHF and also had uremic encephalopathy was brought to the ER after patient's wife found that patient has been having increasing bleeding in the last 2 days from the penile area.  Patient also has become more lethargic with poor oral intake.  Patient's Coumadin was stopped by the patient's wife since patient was having some epistaxis also along with hematuria.  Patient also became more short of breath last few days with incontinence of bowel and feces.  ED Course: In the ER patient was lethargic encephalopathic and ABG was showing pH of 7.27 PCO2 of 66.  Chest x-ray was showing large pleural effusion on the right side.  Patient was placed on BiPAP for hypoxic hypercarbic respiratory failure with acute encephalopathy.  UA shows features concerning for UTI for which patient was started on ceftriaxone.  WBC was 4.6 hemoglobin is around baseline.  BNP 1045 troponin negative.  For the UTI patient was started on ceftriaxone.  I had extensive discussion with patient's wife.  At this time plan is to make patient comfortable if there is no meaningful recovery next few hours.  But continue home medications and antibiotics and BiPAP for now.  Review of Systems: As per HPI, rest all negative.   Past Medical History:  Diagnosis Date  . Acute kidney injury superimposed on chronic kidney disease ([place]) 11/2016  . Acute respiratory failure ([place]) 11/2016  . Allergy   . Asthma   .  Atrial fibrillation ([place])   . 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 Benign essential hypertension   . CHF (congestive heart failure) ([place])   . CKD (chronic kidney disease), stage III ([place])   . COPD (chronic obstructive pulmonary disease) ([place])   . Diabetes ([place])   . Diabetes mellitus without complication ([place])   . Dyspnea   . Gastritis   . GERD (gastroesophageal reflux disease)   . Gout attack 09/2012  . Hypertension   . Sinusitis, maxillary, chronic   . Thyroid disease   . Type II or unspecified type diabetes mellitus without mention of complication, not stated as uncontrolled     Past Surgical History:  Procedure Laterality Date  . COLONOSCOPY  2003  . EYE SURGERY Left 05/06/2017   Cataract removed  . EYE SURGERY Right 02/05/2013   Cataract removed  . HERNIA REPAIR  1980  . IR THORACENTESIS ASP PLEURAL SPACE W/IMG GUIDE  12/02/2017  . RIGHT HEART CATH N/A 02/07/2018   Procedure: RIGHT HEART CATH;  Surgeon: Jettie Booze, MD;  Location: [facility];  Service: Cardiovascular;  Laterality: N/A;     reports that he quit smoking about 21 years ago. His smoking use included cigarettes. He has a 20.00 pack-year smoking history. He has never used smokeless tobacco. He reports that he does not drink alcohol or use drugs.  No Known Allergies  Family History  Problem Relation Age of Onset  . Stroke Mother   . Diabetes Mother   . Cancer Sister   .  Diabetes Sister   . COPD Sister   . COPD Sister   . Diabetes Father     Prior to Admission medications   Medication Sig Start Date End Date Taking? Authorizing Provider  acetaminophen (TYLENOL) 325 MG tablet Take 650 mg by mouth every 6 (six) hours as needed for mild pain.    [provider]  albuterol (PROVENTIL) (2.5 MG/3ML) 0.083% nebulizer solution INHALE CONTENTS OF 1 VIAL IN NEBULIZER EVERY 4 HOURS AS NEEDED FOR WHEEZING AND ASTHMA Patient taking differently: Take 2.5 mg by nebulization  every 4 (four) hours as needed for wheezing (and asthma).  07/18/17   [name], [name] L, DO  allopurinol (ZYLOPRIM) 300 MG tablet TAKE 1 TABLET BY MOUTH EVERY DAY Patient taking differently: Take 300 mg by mouth daily.  11/04/17   [name], [name] L, DO  ALPRAZolam Duanne Moron) 0.5 MG tablet Take 1 tablet (0.5 mg total) by mouth 3 (three) times daily as needed for up to 20 doses for sleep or anxiety. 02/21/18   [name], [name] L, DO  bisoprolol (ZEBETA) 5 MG tablet Take one tablet by mouth once daily for hypertension 02/21/18   [name], [name] L, DO  calcitRIOL (ROCALTROL) 0.25 MCG capsule Take 0.25 mcg by mouth every other day, alternating with 0.5 mcg 02/21/18   [name], [name] L, DO  fluticasone (FLONASE) 50 MCG/ACT nasal spray SPRAY 2 SPRAYS INTO EACH NOSTRIL EVERY DAY Patient taking differently: Place 2 sprays into both nostrils daily as needed for allergies or rhinitis.  12/09/17   [name], [name] L, DO  isosorbide mononitrate (IMDUR) 30 MG 24 hr tablet Take 1 tablet (30 mg total) by mouth daily. 11/12/17   Troy Sine, MD  KLOR-CON M20 20 MEQ tablet TAKE 1 TABLET BY MOUTH EVERY DAY Patient taking differently: Take 20 mEq by mouth daily.  04/09/16   [name], [name] L, DO  levothyroxine (SYNTHROID, LEVOTHROID) 175 MCG tablet TAKE [place] Patient taking differently: Take 175 mcg by mouth daily before breakfast.  11/12/17   [name], [name] L, DO  Multiple Vitamins-Minerals (CENTRUM SILVER ADULT 50+ PO) Take 1 tablet by mouth daily.    [provider]  oxycodone (OXY-IR) 5 MG capsule Take 1 capsule (5 mg total) by mouth every 6 (six) hours as needed for up to 20 doses. 02/16/18   Amin, Jeanella Flattery, MD  OXYGEN Inhale 2 L into the lungs continuous.    [provider]  pantoprazole (PROTONIX) 40 MG tablet Take 1 tablet (40 mg total) by mouth daily. 12/13/17   Lauree Chandler, NP  polyethylene glycol (MIRALAX / GLYCOLAX) packet Take 17 g by mouth daily as needed for moderate  constipation or severe constipation. 02/21/18   [name], [name] L, DO  rosuvastatin (CRESTOR) 20 MG tablet Take 1 tablet (20 mg total) by mouth daily. 11/12/17   Troy Sine, MD  senna-docusate (SENOKOT-S) 8.6-50 MG tablet Take one tablet by mouth at bedtime as needed for mild constipation 02/21/18   [name], [name] L, DO  torsemide (DEMADEX) 20 MG tablet Take 1.5 tablets (30 mg total) by mouth 2 (two) times daily for 30 days. Take 30mg  po twice daily, may take additional 30mg  orally if necessary for edema and shortness of breath 02/16/18 03/18/18  Amin, Jeanella Flattery, MD  umeclidinium-vilanterol (ANORO ELLIPTA) 62.5-25 MCG/INH AEPB Inhale 1 puff into the lungs daily. Patient not taking: Reported on 01/31/2018 12/30/17   Tanda Rockers, MD  UNABLE TO FIND CPAP- At bedtime and during  all naps    [provider]  warfarin (COUMADIN) 5 MG tablet TAKE 1/2 TO 1 TABLET BY MOUTH DAILY AS DIRECTED BY COUMADIN CLINIC Patient taking differently: Take 2.5-5 mg by mouth See admin instructions. Take 2.5 mg by mouth in the morning on Sun/Tues/Wed/Thurs/Sat and 5 mg on Mon/Fri 12/03/17   Troy Sine, MD    Physical Exam: Vitals:   03/03/18 0223 03/03/18 0245 03/03/18 0300 03/03/18 0345  BP: (!) 105/91 116/73 113/80 100/65  Pulse: 73 (!) 58 (!) 107 63  Resp: (!) 27 18 19  (!) 21  Temp:      TempSrc:      SpO2: 99% 99% 100% 97%      Constitutional: Moderately built and nourished. Vitals:   03/03/18 0223 03/03/18 0245 03/03/18 0300 03/03/18 0345  BP: (!) 105/91 116/73 113/80 100/65  Pulse: 73 (!) 58 (!) 107 63  Resp: (!) 27 18 19  (!) 21  Temp:      TempSrc:      SpO2: 99% 99% 100% 97%   Eyes: Anicteric no pallor. ENMT: No discharge from the ears eyes nose or mouth. Neck: No mass felt.  No neck rigidity. Respiratory: No rhonchi or crepitations. Cardiovascular: S1-S2 heard. Abdomen: Soft nontender bowel sounds present. Musculoskeletal: Edema. Skin: Chronic skin changes. Neurologic:  Encephalopathic arousable but confused does not follow commands.  Pupils are reacting to light. Psychiatric: Appears confused.   Labs on Admission: I have personally reviewed following labs and imaging studies  CBC: Recent Labs  Lab 03/03/18 0214 03/03/18 0252  WBC 4.6  --   NEUTROABS 3.1  --   HGB 10.5* 11.6*  HCT 36.8* 34.0*  MCV 109.9*  --   PLT 135*  --    Basic Metabolic Panel: Recent Labs  Lab 03/03/18 0214 03/03/18 0252  NA 142 141  K 5.2* 5.1  CL 103  --   CO2 30  --   GLUCOSE 103*  --   BUN 82*  --   CREATININE 3.72*  --   CALCIUM 9.5  --    GFR: CrCl cannot be calculated (Unknown ideal weight.). Liver Function Tests: No results for input(s): AST, ALT, ALKPHOS, BILITOT, PROT, ALBUMIN in the last 168 hours. No results for input(s): LIPASE, AMYLASE in the last 168 hours. No results for input(s): AMMONIA in the last 168 hours. Coagulation Profile: Recent Labs  Lab 03/03/18 0214  INR 1.49   Cardiac Enzymes: No results for input(s): CKTOTAL, CKMB, CKMBINDEX, TROPONINI in the last 168 hours. BNP (last 3 results) No results for input(s): PROBNP in the last 8760 hours. HbA1C: No results for input(s): HGBA1C in the last 72 hours. CBG: No results for input(s): GLUCAP in the last 168 hours. Lipid Profile: No results for input(s): CHOL, HDL, LDLCALC, TRIG, CHOLHDL, LDLDIRECT in the last 72 hours. Thyroid Function Tests: No results for input(s): TSH, T4TOTAL, FREET4, T3FREE, THYROIDAB in the last 72 hours. Anemia Panel: No results for input(s): VITAMINB12, FOLATE, FERRITIN, TIBC, IRON, RETICCTPCT in the last 72 hours. Urine analysis:    Component Value Date/Time   COLORURINE YELLOW 03/03/2018 0254   APPEARANCEUR CLOUDY (A) 03/03/2018 0254   LABSPEC 1.012 03/03/2018 0254   PHURINE 5.0 03/03/2018 0254   GLUCOSEU NEGATIVE 03/03/2018 0254   HGBUR LARGE (A) 03/03/2018 0254   BILIRUBINUR NEGATIVE 03/03/2018 0254   KETONESUR NEGATIVE 03/03/2018 0254    PROTEINUR 30 (A) 03/03/2018 0254   UROBILINOGEN 0.2 06/17/2012 0622   NITRITE NEGATIVE 03/03/2018 0254   LEUKOCYTESUR  MODERATE (A) 03/03/2018 0254   Sepsis Labs: @LABRCNTIP (procalcitonin:4,lacticidven:4) )No results found for this or any previous visit (from the past 240 hour(s)).   Radiological Exams on Admission: Dg Chest Port 1 View  Result Date: 03/03/2018 CLINICAL DATA:  Increased shortness of breath tonight. EXAM: PORTABLE CHEST 1 VIEW COMPARISON:  Most recent chest radiograph 02/04/2018 FINDINGS: Cardiomegaly is similar to prior. Aortic atherosclerosis. Moderate to large right pleural effusion associated basilar airspace disease, not significantly changed allowing for differences in technique. Muscular congestion with mild pulmonary edema, slight progression from prior. No pneumothorax. IMPRESSION: Moderate to large right pleural effusion with associated basilar airspace disease/atelectasis, not significantly changed from 02/04/2018 exam. Slight progression of pulmonary edema. Stable cardiomegaly.  Aortic Atherosclerosis (ICD10-I70.0). Electronically Signed   By: Keith Rake M.D.   On: 03/03/2018 02:39    EKG: Independently reviewed.  A. fib rate controlled.  Assessment/Plan Principal Problem:   Acute respiratory failure with hypoxia and hypercapnia (HCC) Active Problems:   COPD exacerbation (HCC)   OSA on CPAP   Persistent atrial fibrillation   Alteration in anticoagulation   Essential hypertension, benign   Hypothyroidism   Diabetes mellitus with renal complications (HCC)   Acute respiratory failure with hypoxia (HCC)   Acute respiratory failure with hypoxia and hypercarbia (HCC)    1. Acute respiratory failure with hypoxia and hypercarbia likely from COPD with worsening pleural effusion probably contributing from progressive renal failure and CHF and also sleep apnea for which patient has been placed on BiPAP. 2. Hematuria with possible UTI for which patient is on  ceftriaxone. 3. Diabetes mellitus type 2 for which patient will be placed on sliding scale coverage. 4. Hypertension we will keep patient on PRN IV hydralazine since patient cannot take orally.  But will continue home medication if patient can swallow. 5. Sleep apnea on CPAP. 6. COPD on inhalers. 7. Chronic kidney disease stage IV-V with progression. 8. Hypothyroidism on Synthroid. 9. Persistent atrial fibrillation rate control off anticoagulation secondary to epistaxis and pneumaturia. 10. UTI on ceftriaxone. 11. History of dementia.  Plan -at this time after discussing with patient's wife it was decided by patient's wife that if there is no meaningful improvement in the next few hours patient will be made full comfort measures.  For now we will continue home medications and start oral medication only if patient is more alert awake.  I have consulted hospice.   DVT prophylaxis: SCDs. Code Status: DNR. Family Communication: Patient's wife. Disposition Plan: To be determined. Consults called: Palliative care. Admission status: Inpatient.   Rise Patience MD [facility] Pager [phone].  If 7PM-7AM, please contact night-coverage [email or web address] Password [facility]  03/03/2018, 5:35 AM

## 2018-03-03 NOTE — Progress Notes (Addendum)
[facility] ([facility])  GIP RN Visit  This is a related and covered admission with a hospice diagnosis of hypertensive heart disease.  EMS was activated after pt had fallen and wife was unable to help him get up.  He is admitted with c/o hematuria, acute respiratory failure and suspected UTI.  Received report from RN, states he just got settled and seemed to be doing ok.  Met with pt and multiple family members, including wife Earlie Server, at the bedside.  Pt asleep, did not interact during this visit.  Earlie Server states he had fallen and was unable to get up.  She contacted EMS for assistance getting back to bed, but pt was more confused than normal.  She also reports that he had a gross amount of blood from his penis.  V/S: 97.9 ax, 134/74, HR 70, RR 20, SPO2 88% 2LPM [place]  Abnormal Labs:  ABG pH 7.278 CO2 66.9, bicarb 31.6; K 5.2, BUN 82, Creat 3.72, EGFR 17, BNP 1,045, hgb 11.6, HCT 34, urine: cloudy, large hgb, moderate leukocytes, protein 30, casts present, RBC >50  Chest xray: IMPRESSION:  Moderate to large right pleural effusion with associated basilar  airspace disease/atelectasis, not significantly changed from  02/04/2018 exam. Slight progression of pulmonary edema.  Hematuria:  Rocephin 1g qd, three way foley placed--after placement no further bleeding CBI held at this time, urine culture  UTI:  Rocephin 1g IV qd  ARF:  Bipap as needed  Discharge planning:  Ongoing.  [facility] will continue to follow and address changing needs.  Transfer summary and medication list placed on the shadow chart.  PCG:  Updated at the bedside  IDT:  Updated  Please call with any hospice related questions or concerns Venia Carbon BSN, RN [facility] Liaison (direct # listed in [place] (main #)

## 2018-03-03 NOTE — ED Triage Notes (Signed)
Pt BIB [facility] from home. Hospice pt, initially EMS called out to help pt get back in the bed, pt noted to be short of breath. Pt non-verbal with EMS. Given 5mg  albuterol and 0.5mg  atrovent PTA.

## 2018-03-03 NOTE — ED Notes (Signed)
3-way catheter inserted, no frank blood noted. Per Dr. Gilford Raid, no CBI needed at this time.

## 2018-03-03 NOTE — Telephone Encounter (Signed)
Patient wife, Earlie Server [phone] is calling wanting to know the status of her FMLA paperwork. Stated that her work is waiting for it. Please Advise.

## 2018-03-03 NOTE — Telephone Encounter (Signed)
Still working on it.  Does she want a chunk of time off or is she taking some time off each day or week or month?  It's always challenging to fill these out.

## 2018-03-03 NOTE — Progress Notes (Signed)
RT Note:  Performed ABG at the request of Dr. Gilford Raid,  Ran ABG and reported results to her (elevated CO2 66.9).

## 2018-03-03 NOTE — Progress Notes (Signed)
RT Note:  Patient kept pulling off Bipap mask.  Pulled patient completely off and the RN placed patient back on [place] 2L.  Patient's sats are ranging from 97 to 100%.  Will Continue to monitor.

## 2018-03-03 NOTE — Progress Notes (Signed)
Palliative Medicine RN Note: Consult order noted for [place].  Spoke with Venia Carbon, RN with [facility]. Patient is actively admitted to hospice, and she will see him today. She reports GOC are clear, and [facility] will address any GOC concerns that come up.  At this time, PMT will not see Mr Fetch at [facility] direction. If something changes or a need arises, [facility] will contact PMT.  Marjie Skiff Makana Feigel, RN, BSN, [facility] Palliative Medicine Team 03/03/2018 9:57 AM Office [phone]

## 2018-03-03 NOTE — Progress Notes (Signed)
[place] TEAM 1 - Stepdown/ICU TEAM  Omar Lambert  ASN:[id number] DOB: 1940-12-07 DOA: 03/03/2018 PCP: Gayland Curry, DO    Brief Narrative:  78 y.o. male w/ a hx of CKD stage IV-V, diastolic CHF, atrial fibrillation, chronic anemia, DM2, gout, hypothyroidism, and anemia who was discharged 2 weeks ago after being admitted for CHF and uremic encephalopathy. He was brought to the ER w/ his wife reporting increasing bleeding from the penis, as well as intermittent epistaxis. She stopped his warfarin.   In the ER the patient was lethargic/encephalopathic. A CXR noted a large R pleural effusion. Patient was placed on BiPAP for hypoxic hypercarbic respiratory failure with acute encephalopathy. UA was suggestive of UTI.  Significant Events: 1/27 admit   Subjective: Pt is seen for a f/u visit.    Assessment & Plan:  Acute respiratory failure with hypoxia and hypercarbia  Hematuria / possible UTI on ceftriaxone empirically   Chronic diastolic CHF - grade 3  Filed Weights   03/03/18 0819  Weight: 102.7 kg    Chronic kidney disease stage IV-V   Recent Labs  Lab 03/03/18 0214  CREATININE 3.72*    Diabetes mellitus type 2   Hypertension   Sleep apnea on CPAP  O2 dependent COPD   Hypothyroidism on Synthroid  Persistent atrial fibrillation off anticoagulation secondary to epistaxis and hematuria  History of dementia  DVT prophylaxis: SCDs Code Status: DNR - NO CODE Family Communication: spoke w/ his wife at bedside  Disposition Plan: stable for med/surg bed   Consultants:  Hospice   Antimicrobials:  Rocephin 1/26 >  Objective: Blood pressure 134/74, pulse 70, temperature 97.9 F (36.6 C), temperature source Axillary, resp. rate 20, height 5\' 9"  (1.753 m), weight 102.7 kg, SpO2 90 %. No intake or output data in the 24 hours ending 03/03/18 1458 Filed Weights   03/03/18 0819  Weight: 102.7 kg    Examination: Pt was seen for a f/u visit.     CBC: Recent Labs  Lab 03/03/18 0214 03/03/18 0252  WBC 4.6  --   NEUTROABS 3.1  --   HGB 10.5* 11.6*  HCT 36.8* 34.0*  MCV 109.9*  --   PLT 135*  --    Basic Metabolic Panel: Recent Labs  Lab 03/03/18 0214 03/03/18 0252  NA 142 141  K 5.2* 5.1  CL 103  --   CO2 30  --   GLUCOSE 103*  --   BUN 82*  --   CREATININE 3.72*  --   CALCIUM 9.5  --    GFR: Estimated Creatinine Clearance: 19.3 mL/min (A) (by C-G formula based on SCr of 3.72 mg/dL (H)).   Coagulation Profile: Recent Labs  Lab 03/03/18 0214  INR 1.49    HbA1C: Hgb A1c MFr Bld  Date/Time Value Ref Range Status  11/26/2017 02:17 AM 6.2 (H) 4.8 - 5.6 % Final    Comment:    (NOTE) Pre diabetes:          5.7%-6.4% Diabetes:              >6.4% Glycemic control for   <7.0% adults with diabetes   07/29/2017 10:32 AM 6.0 (H) <5.7 % of total Hgb Final    Comment:    For someone without known diabetes, a hemoglobin  A1c value between 5.7% and 6.4% is consistent with prediabetes and should be confirmed with a  follow-up test. . For someone with known diabetes, a value <7% indicates that their diabetes is  well controlled. A1c targets should be individualized based on duration of diabetes, age, comorbid conditions, and other considerations. . This assay result is consistent with an increased risk of diabetes. . Currently, no consensus exists regarding use of hemoglobin A1c for diagnosis of diabetes for children. .     CBG: Recent Labs  Lab 03/03/18 0941 03/03/18 1204  GLUCAP 83 79    Recent Results (from the past 240 hour(s))  MRSA PCR Screening     Status: None   Collection Time: 03/03/18  9:26 AM  Result Value Ref Range Status   MRSA by PCR NEGATIVE NEGATIVE Final    Comment:        The GeneXpert MRSA Assay (FDA approved for NASAL specimens only), is one component of a comprehensive MRSA colonization surveillance program. It is not intended to diagnose MRSA infection nor to guide  or monitor treatment for MRSA infections. Performed at [facility], [place] [address], [place],  [address]      Scheduled Meds: . albuterol  2.5 mg Nebulization BID  . allopurinol  300 mg Oral Daily  . bisoprolol  5 mg Oral Daily  . calcitRIOL  0.25 mcg Oral QODAY  . insulin aspart  0-9 Units Subcutaneous TID WC  . ipratropium  0.5 mg Nebulization BID  . isosorbide dinitrate  10 mg Oral TID  . levothyroxine  175 mcg Oral Q0600  . pantoprazole sodium  40 mg Per Tube Daily  . rosuvastatin  20 mg Oral Daily  . senna-docusate  1 tablet Oral QHS  . torsemide  30 mg Oral BID   Continuous Infusions: . [START ON 03/04/2018] cefTRIAXone (ROCEPHIN)  IV       LOS: 0 days   Time spent: No Charge  Cherene Altes, MD [facility] Office  [phone] Pager - Text Page per Amion as per below:  On-Call/Text Page:      [email or web address]  If 7PM-7AM, please contact night-coverage [email or web address] 03/03/2018, 2:58 PM

## 2018-03-03 NOTE — Progress Notes (Signed)
Patient noted to have blood tinged urine. Doctor Notified.

## 2018-03-03 NOTE — ED Provider Notes (Signed)
[facility] EMERGENCY DEPARTMENT Provider Note   CSN: [id number] Arrival date & time: 03/03/18  0206     History   Chief Complaint Chief Complaint  Patient presents with  . Shortness of Breath    HPI Omar Lambert is a 78 y.o. male.  Pt presents to the ED today with SOB and weakness.  The pt was admitted from 12/27-1/12 with COPD, CHF, and worsening CKD.  The pt was diuresed during admission and was thought to have cardiorenal syndrome.  Kidney numbers continued to worsen, so he was d/c home with hospice.  The pt's wife does not want him intubated or CPR.  The pt does have a.fib and had been on coumadin.  The coumadin was stopped over 1 week ago.  The pt started having some bleeding from his penis on the 23rd which has progressed.  Today, he urinated clots and became more sob, so the wife called EMS.  Pt is unable to give any hx.  His wife said he did not have an indwelling foley while admitted.     Past Medical History:  Diagnosis Date  . Acute kidney injury superimposed on chronic kidney disease ([place]) 11/2016  . Acute respiratory failure ([place]) 11/2016  . Allergy   . Asthma   . Atrial fibrillation ([place])   . 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 Benign essential hypertension   . CHF (congestive heart failure) ([place])   . CKD (chronic kidney disease), stage III ([place])   . COPD (chronic obstructive pulmonary disease) ([place])   . Diabetes ([place])   . Diabetes mellitus without complication ([place])   . Dyspnea   . Gastritis   . GERD (gastroesophageal reflux disease)   . Gout attack 09/2012  . Hypertension   . Sinusitis, maxillary, chronic   . Thyroid disease   . Type II or unspecified type diabetes mellitus without mention of complication, not stated as uncontrolled     Patient Active Problem List   Diagnosis Date Noted  . Acute respiratory failure with hypoxia ([place]) 03/03/2018  . Palliative care by specialist   . Acute on chronic diastolic  (congestive) heart failure ([place]) 01/31/2018  . Pressure injury of skin 11/27/2017  . Anemia 11/24/2017  . HCAP (healthcare-associated pneumonia) 11/24/2017  . Pleural effusion on right 10/11/2017  . Chronic respiratory failure with hypoxia and hypercapnia ([place]) 09/10/2017  . Chronic cor pulmonale ([place]) 09/10/2017  . Acute on chronic congestive heart failure ([place]) 08/29/2017  . Respiratory distress 08/29/2017  . Hypokalemia 08/29/2017  . Diabetic ulcer of lower leg ([place]) 08/29/2017  . CKD (chronic kidney disease), stage III ([place])   . Acute on chronic diastolic CHF (congestive heart failure) ([place])   . Elevated troponin I level   . Acute on chronic diastolic heart failure ([place]) 07/29/2017  . Chronic venous hypertension (idiopathic) with ulcer of right lower extremity (CODE) ([place]) 07/29/2017  . Blood clotting disorder ([place]) 12/03/2016  . Long term (current) use of anticoagulants [Z79.01] 11/12/2016  . Goals of care, counseling/discussion   . Bradycardia with 41-50 beats per minute   . Acute kidney injury superimposed on chronic kidney disease ([place])   . Shortness of breath 04/10/2016  . Diabetes mellitus with renal complications ([place])   . Pneumonia   . Hypothyroidism 03/29/2016  . Essential hypertension, benign 02/28/2015  . 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 Morbid obesity ([place]) 02/28/2015  . Hyperlipidemia associated with type 2 diabetes mellitus ([place]) 02/28/2015  . Persistent atrial  fibrillation 12/18/2014  . Alteration in anticoagulation 12/18/2014  . Chronic kidney disease (CKD) stage G3b/A2, moderately decreased glomerular filtration rate (GFR) between 30-44 mL/min/1.73 square meter and albuminuria creatinine ratio between 30-299 mg/g (HCC) 08/19/2014  . Noncompliance with medication regimen 02/15/2014  . Environmental allergies 02/15/2014  . 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 Esophageal reflux 09/18/2013  . Venous insufficiency of both lower extremities 03/14/2013  . OSA on CPAP  12/22/2012  . COPD exacerbation ([place]) 11/26/2012  . Asthma   . Benign essential hypertension     Past Surgical History:  Procedure Laterality Date  . COLONOSCOPY  2003  . EYE SURGERY Left 05/06/2017   Cataract removed  . EYE SURGERY Right 02/05/2013   Cataract removed  . HERNIA REPAIR  1980  . IR THORACENTESIS ASP PLEURAL SPACE W/IMG GUIDE  12/02/2017  . RIGHT HEART CATH N/A 02/07/2018   Procedure: RIGHT HEART CATH;  Surgeon: Jettie Booze, MD;  Location: [facility];  Service: Cardiovascular;  Laterality: N/A;        Home Medications    Prior to Admission medications   Medication Sig Start Date End Date Taking? Authorizing Provider  acetaminophen (TYLENOL) 325 MG tablet Take 650 mg by mouth every 6 (six) hours as needed for mild pain.    [provider]  albuterol (PROVENTIL) (2.5 MG/3ML) 0.083% nebulizer solution INHALE CONTENTS OF 1 VIAL IN NEBULIZER EVERY 4 HOURS AS NEEDED FOR WHEEZING AND ASTHMA Patient taking differently: Take 2.5 mg by nebulization every 4 (four) hours as needed for wheezing (and asthma).  07/18/17   [name], [name] L, DO  allopurinol (ZYLOPRIM) 300 MG tablet TAKE 1 TABLET BY MOUTH EVERY DAY Patient taking differently: Take 300 mg by mouth daily.  11/04/17   [name], [name] L, DO  ALPRAZolam Duanne Moron) 0.5 MG tablet Take 1 tablet (0.5 mg total) by mouth 3 (three) times daily as needed for up to 20 doses for sleep or anxiety. 02/21/18   [name], [name] L, DO  bisoprolol (ZEBETA) 5 MG tablet Take one tablet by mouth once daily for hypertension 02/21/18   [name], [name] L, DO  calcitRIOL (ROCALTROL) 0.25 MCG capsule Take 0.25 mcg by mouth every other day, alternating with 0.5 mcg 02/21/18   [name], [name] L, DO  fluticasone (FLONASE) 50 MCG/ACT nasal spray SPRAY 2 SPRAYS INTO EACH NOSTRIL EVERY DAY Patient taking differently: Place 2 sprays into both nostrils daily as needed for allergies or rhinitis.  12/09/17   [name], [name] L, DO  isosorbide mononitrate  (IMDUR) 30 MG 24 hr tablet Take 1 tablet (30 mg total) by mouth daily. 11/12/17   Troy Sine, MD  KLOR-CON M20 20 MEQ tablet TAKE 1 TABLET BY MOUTH EVERY DAY Patient taking differently: Take 20 mEq by mouth daily.  04/09/16   [name], [name] L, DO  levothyroxine (SYNTHROID, LEVOTHROID) 175 MCG tablet TAKE [place] Patient taking differently: Take 175 mcg by mouth daily before breakfast.  11/12/17   [name], [name] L, DO  Multiple Vitamins-Minerals (CENTRUM SILVER ADULT 50+ PO) Take 1 tablet by mouth daily.    [provider]  oxycodone (OXY-IR) 5 MG capsule Take 1 capsule (5 mg total) by mouth every 6 (six) hours as needed for up to 20 doses. 02/16/18   Amin, Jeanella Flattery, MD  OXYGEN Inhale 2 L into the lungs continuous.    [provider]  pantoprazole (PROTONIX) 40 MG tablet Take 1 tablet (40 mg total) by mouth daily. 12/13/17  Lauree Chandler, NP  polyethylene glycol (MIRALAX / GLYCOLAX) packet Take 17 g by mouth daily as needed for moderate constipation or severe constipation. 02/21/18   [name], [name] L, DO  rosuvastatin (CRESTOR) 20 MG tablet Take 1 tablet (20 mg total) by mouth daily. 11/12/17   Troy Sine, MD  senna-docusate (SENOKOT-S) 8.6-50 MG tablet Take one tablet by mouth at bedtime as needed for mild constipation 02/21/18   [name], [name] L, DO  torsemide (DEMADEX) 20 MG tablet Take 1.5 tablets (30 mg total) by mouth 2 (two) times daily for 30 days. Take 30mg  po twice daily, may take additional 30mg  orally if necessary for edema and shortness of breath 02/16/18 03/18/18  Amin, Jeanella Flattery, MD  umeclidinium-vilanterol (ANORO ELLIPTA) 62.5-25 MCG/INH AEPB Inhale 1 puff into the lungs daily. Patient not taking: Reported on 01/31/2018 12/30/17   Tanda Rockers, MD  UNABLE TO FIND CPAP- At bedtime and during all naps    [provider]  warfarin (COUMADIN) 5 MG tablet TAKE 1/2 TO 1 TABLET BY MOUTH DAILY AS DIRECTED BY COUMADIN  CLINIC Patient taking differently: Take 2.5-5 mg by mouth See admin instructions. Take 2.5 mg by mouth in the morning on Sun/Tues/Wed/Thurs/Sat and 5 mg on Mon/Fri 12/03/17   Troy Sine, MD    Family History Family History  Problem Relation Age of Onset  . Stroke Mother   . Diabetes Mother   . Cancer Sister   . Diabetes Sister   . COPD Sister   . COPD Sister   . Diabetes Father     Social History Social History   Tobacco Use  . Smoking status: Former Smoker    Packs/day: 1.00    Years: 20.00    Pack years: 20.00    Types: Cigarettes    Last attempt to quit: 02/05/1997    Years since quitting: 21.0  . Smokeless tobacco: Never Used  Substance Use Topics  . Alcohol use: No  . Drug use: No     Allergies   Patient has no known allergies.   Review of Systems Review of Systems  Unable to perform ROS: Mental status change  All other systems reviewed and are negative.    Physical Exam Updated Vital Signs BP 113/80   Pulse (!) 107   Temp (!) 97.4 F (36.3 C) (Temporal)   Resp 19   SpO2 100%   Physical Exam Vitals signs and nursing note reviewed.  Constitutional:      General: He is in acute distress.     Appearance: He is toxic-appearing.  HENT:     Head: Normocephalic and atraumatic.  Eyes:     Extraocular Movements: Extraocular movements intact.     Pupils: Pupils are equal, round, and reactive to light.  Neck:     Musculoskeletal: Normal range of motion and neck supple.  Cardiovascular:     Rate and Rhythm: Normal rate. Rhythm irregular.  Pulmonary:     Effort: Tachypnea and respiratory distress present.     Breath sounds: Rhonchi present.  Abdominal:     General: Bowel sounds are normal.     Palpations: Abdomen is soft.  Genitourinary:    Comments: No active urethral bleeding.  No trauma. Musculoskeletal:     Right lower leg: Edema present.     Left lower leg: Edema present.  Skin:    General: Skin is warm.     Capillary Refill: Capillary  refill takes less than 2 seconds.  Neurological:  Mental Status: He is lethargic.     Comments: Minimally responsive      ED Treatments / Results  Labs (all labs ordered are listed, but only abnormal results are displayed) Labs Reviewed  BASIC METABOLIC PANEL - Abnormal; Notable for the following components:      Result Value   Potassium 5.2 (*)    Glucose, Bld 103 (*)    BUN 82 (*)    Creatinine, Ser 3.72 (*)    GFR calc non Af Amer 15 (*)    GFR calc Af Amer 17 (*)    All other components within normal limits  CBC WITH DIFFERENTIAL/PLATELET - Abnormal; Notable for the following components:   RBC 3.35 (*)    Hemoglobin 10.5 (*)    HCT 36.8 (*)    MCV 109.9 (*)    MCHC 28.5 (*)    RDW 16.6 (*)    Platelets 135 (*)    nRBC 1.3 (*)    All other components within normal limits  BRAIN NATRIURETIC PEPTIDE - Abnormal; Notable for the following components:   B Natriuretic Peptide 1,045.3 (*)    All other components within normal limits  URINALYSIS, ROUTINE W REFLEX MICROSCOPIC - Abnormal; Notable for the following components:   APPearance CLOUDY (*)    Hgb urine dipstick LARGE (*)    Protein, ur 30 (*)    Leukocytes, UA MODERATE (*)    RBC / HPF >50 (*)    WBC, UA >50 (*)    Bacteria, UA RARE (*)    All other components within normal limits  PROTIME-INR - Abnormal; Notable for the following components:   Prothrombin Time 17.9 (*)    All other components within normal limits  POCT I-STAT 7, (LYTES, BLD GAS, ICA,H+H) - Abnormal; Notable for the following components:   pH, Arterial 7.278 (*)    pCO2 arterial 66.9 (*)    pO2, Arterial 110.0 (*)    Bicarbonate 31.6 (*)    TCO2 34 (*)    Acid-Base Excess 3.0 (*)    HCT 34.0 (*)    Hemoglobin 11.6 (*)    All other components within normal limits  URINE CULTURE  I-STAT TROPONIN, ED  I-STAT ARTERIAL BLOOD GAS, ED  TYPE AND SCREEN    EKG EKG Interpretation  Date/Time:  Monday March 03 2018 02:10:50  EST Ventricular Rate:  76 PR Interval:    QRS Duration: 102 QT Interval:  412 QTC Calculation: 464 R Axis:   -72 Text Interpretation:  Atrial fibrillation Ventricular premature complex Incomplete RBBB and LAFB Low voltage, precordial leads RSR' in V1 or V2, right VCD or RVH Consider anterior infarct Confirmed by Isla Pence [phone]) on 03/03/2018 2:20:08 AM   Radiology Dg Chest Port 1 View  Result Date: 03/03/2018 CLINICAL DATA:  Increased shortness of breath tonight. EXAM: PORTABLE CHEST 1 VIEW COMPARISON:  Most recent chest radiograph 02/04/2018 FINDINGS: Cardiomegaly is similar to prior. Aortic atherosclerosis. Moderate to large right pleural effusion associated basilar airspace disease, not significantly changed allowing for differences in technique. Muscular congestion with mild pulmonary edema, slight progression from prior. No pneumothorax. IMPRESSION: Moderate to large right pleural effusion with associated basilar airspace disease/atelectasis, not significantly changed from 02/04/2018 exam. Slight progression of pulmonary edema. Stable cardiomegaly.  Aortic Atherosclerosis (ICD10-I70.0). Electronically Signed   By: Keith Rake M.D.   On: 03/03/2018 02:39    Procedures Procedures (including critical care time)  Medications Ordered in ED Medications  lidocaine (XYLOCAINE) 2 % jelly 1  application (has no administration in time range)  cefTRIAXone (ROCEPHIN) 1 g in sodium chloride 0.9 % 100 mL IVPB (has no administration in time range)  furosemide (LASIX) injection 40 mg (40 mg Intravenous Given 03/03/18 0301)     Initial Impression / Assessment and Plan / ED Course  I have reviewed the triage vital signs and the nursing notes.  Pertinent labs & imaging results that were available during my care of the patient were reviewed by me and considered in my medical decision making (see chart for details).    CRITICAL CARE Performed by: Isla Pence   Total critical care  time: 45 minutes  Critical care time was exclusive of separately billable procedures and treating other patients.  Critical care was necessary to treat or prevent imminent or life-threatening deterioration.  Critical care was time spent personally by me on the following activities: development of treatment plan with patient and/or surrogate as well as nursing, discussions with consultants, evaluation of patient's response to treatment, examination of patient, obtaining history from patient or surrogate, ordering and performing treatments and interventions, ordering and review of laboratory studies, ordering and review of radiographic studies, pulse oximetry and re-evaluation of patient's condition.  Pt's wife does have a DNR form with her and confirms DNR status.  3 way foley catheter placed due to the hx of hematuria with large clots, but no gross hematuria noted in catheter.  So, his bladder was not irrigated.  Urine does show a UTI.  He was treated with Rocephin 1g IV.    The pt was placed on bipap to see if that would help with his resp status, and it has a small amount.  Pt d/w Dr. Hal Hope (triad) regarding admission.   Final Clinical Impressions(s) / ED Diagnoses   Final diagnoses:  Acute on chronic respiratory failure with hypercapnia (HCC)  Acute cystitis with hematuria  CKD (chronic kidney disease) stage 4, GFR 15-29 ml/min (HCC)  Anemia, unspecified type  Pleural effusion, right  DNR (do not resuscitate)    ED Discharge Orders    None       Isla Pence, MD 03/03/18 [phone]

## 2018-03-04 DIAGNOSIS — E039 Hypothyroidism, unspecified: Secondary | ICD-10-CM

## 2018-03-04 DIAGNOSIS — N3001 Acute cystitis with hematuria: Secondary | ICD-10-CM

## 2018-03-04 DIAGNOSIS — N184 Chronic kidney disease, stage 4 (severe): Secondary | ICD-10-CM

## 2018-03-04 LAB — HEMOGLOBIN AND HEMATOCRIT, BLOOD
HCT: 33.2 % — ABNORMAL LOW (ref 39.0–52.0)
Hemoglobin: 9.9 g/dL — ABNORMAL LOW (ref 13.0–17.0)

## 2018-03-04 LAB — GLUCOSE, CAPILLARY
Glucose-Capillary: 70 mg/dL (ref 70–99)
Glucose-Capillary: 90 mg/dL (ref 70–99)

## 2018-03-04 MED ORDER — ACETAMINOPHEN 325 MG PO TABS: 650.0000 mg | ORAL_TABLET | Freq: Four times a day (QID) | ORAL | Status: DC | PRN

## 2018-03-04 MED ORDER — ONDANSETRON HCL 4 MG/2ML IJ SOLN: 4.0000 mg | Freq: Four times a day (QID) | INTRAMUSCULAR | Status: DC | PRN

## 2018-03-04 MED ORDER — LORAZEPAM 2 MG/ML IJ SOLN: 1.0000 mg | INTRAMUSCULAR | Status: DC | PRN

## 2018-03-04 MED ORDER — HALOPERIDOL LACTATE 5 MG/ML IJ SOLN: 0.5000 mg | INTRAMUSCULAR | Status: DC | PRN

## 2018-03-04 MED ORDER — HALOPERIDOL 0.5 MG PO TABS
0.5000 mg | ORAL_TABLET | ORAL | Status: DC | PRN
  Filled 2018-03-04: qty 1

## 2018-03-04 MED ORDER — ALBUTEROL SULFATE (2.5 MG/3ML) 0.083% IN NEBU: 2.5000 mg | INHALATION_SOLUTION | RESPIRATORY_TRACT | Status: DC | PRN

## 2018-03-04 MED ORDER — HYDROMORPHONE HCL 1 MG/ML PO LIQD
0.5000 mg | ORAL | Status: DC
  Administered 2018-03-04: 0.5 mg via ORAL
  Filled 2018-03-04: qty 0.5

## 2018-03-04 MED ORDER — GLYCOPYRROLATE 0.2 MG/ML IJ SOLN: 0.2000 mg | INTRAMUSCULAR | Status: DC | PRN

## 2018-03-04 MED ORDER — BIOTENE DRY MOUTH MT LIQD: 15.0000 mL | OROMUCOSAL | Status: DC | PRN

## 2018-03-04 MED ORDER — HYDROMORPHONE HCL 1 MG/ML IJ SOLN: 1.0000 mg | INTRAMUSCULAR | Status: DC | PRN

## 2018-03-04 MED ORDER — ACETAMINOPHEN 650 MG RE SUPP: 650.0000 mg | Freq: Four times a day (QID) | RECTAL | Status: DC | PRN

## 2018-03-04 MED ORDER — LORAZEPAM 2 MG/ML IJ SOLN: 1.0000 mg | INTRAMUSCULAR | 0 refills | Status: AC | PRN

## 2018-03-04 MED ORDER — HYDROMORPHONE HCL 1 MG/ML PO LIQD: 0.5000 mg | ORAL | 0 refills | Status: AC

## 2018-03-04 MED ORDER — ONDANSETRON 4 MG PO TBDP: 4.0000 mg | ORAL_TABLET | Freq: Four times a day (QID) | ORAL | Status: DC | PRN

## 2018-03-04 MED ORDER — GLYCOPYRROLATE 0.2 MG/ML IJ SOLN
0.2000 mg | INTRAMUSCULAR | Status: DC
  Administered 2018-03-04: 0.2 mg via INTRAVENOUS
  Filled 2018-03-04: qty 1

## 2018-03-04 MED ORDER — POLYVINYL ALCOHOL 1.4 % OP SOLN
1.0000 [drp] | Freq: Four times a day (QID) | OPHTHALMIC | Status: DC | PRN
  Filled 2018-03-04: qty 15

## 2018-03-04 MED ORDER — GLYCOPYRROLATE 0.2 MG/ML IJ SOLN: 0.2000 mg | INTRAMUSCULAR | Status: AC

## 2018-03-04 MED ORDER — GLYCOPYRROLATE 1 MG PO TABS: 1.0000 mg | ORAL_TABLET | ORAL | Status: DC | PRN

## 2018-03-04 MED ORDER — ALBUTEROL SULFATE (2.5 MG/3ML) 0.083% IN NEBU: 2.5000 mg | INHALATION_SOLUTION | RESPIRATORY_TRACT | 12 refills | Status: AC | PRN

## 2018-03-04 MED ORDER — HALOPERIDOL LACTATE 2 MG/ML PO CONC
0.5000 mg | ORAL | Status: DC | PRN
  Filled 2018-03-04: qty 0.3

## 2018-03-04 MED ORDER — DIPHENHYDRAMINE HCL 50 MG/ML IJ SOLN: 12.5000 mg | INTRAMUSCULAR | Status: DC | PRN

## 2018-03-04 NOTE — Progress Notes (Signed)
Patient will DC to: [facility] Anticipated DC date: 03/04/18 Family notified: Spouse Transport by: [facility]   Per MD patient ready for DC to [facility]. RN, patient, patient's family, and facility notified of DC. Discharge Summary sent to facility. RN to call report prior to discharge [phone]). DC packet on chart. Ambulance transport requested for patient.   CSW will sign off for now as social work intervention is no longer needed. Please consult [place] again if new needs arise.  Cedric Fishman, LCSW Clinical Social Worker [phone]

## 2018-03-04 NOTE — Progress Notes (Addendum)
PROGRESS NOTE        PATIENT DETAILS Name: Omar Lambert Age: 78 y.o. Sex: male Date of Birth: Oct 29, 1940 Admit Date: 03/03/2018 Admitting Physician Rise Patience, MD NTI:RWER, Rexene Edison, DO  Brief Narrative: Patient is a 78 y.o. 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  See below for further details  Subjective: Lethargic-"gurgling"-very weak.  Per RN and family-hardly any oral intake over the past few days.  Assessment/Plan: Acute hypoxic and hypercarbic respiratory failure: Multifactorial-likely secondary to worsening deconditioning/muscle fatigue in the setting of failure to thrive syndrome in a background of decompensated diastolic heart failure and COPD. Suspect patient also has some amount of aspiration that is contributing.  Patient is clearly deteriorating-discussed with spouse at bedside-elects to transition to full comfort measures.  Acute on chronic diastolic heart failure: Volume status is improved-but patient is clearly deteriorating with your failure to thrive syndrome.  Supportive care only at this time-spouse has elected to transition to full comfort measures.  Acute metabolic encephalopathy: Secondary to hypoxia/hypercarbia-supportive care only.  Large right-sided pleural effusion: Secondary to CHF-no further work-up-patient comfort care status.  Hematuria with complicated UTI: Hematuria is resolved-since patient deteriorating with supportive care-and family has elected to transition to full comfort measures-stop all antimicrobial therapy.  Atrial fibrillation: Since family has elected to pursue comfort measures-we will not plan on resuming anticoagulation.  Supportive care only.  CKD stage IV: Thought  to be secondary to cardiorenal syndrome.  Comfort measures in place-supportive care only.  COPD: Continue bronchodilators for comfort.  Hypothyroidism: Since transition to comfort measures-stop Synthroid.  Dementia  Failure to thrive syndrome/severe deconditioning/palliative care: Rapidly deteriorating-seems to be accumulating secretions and possibly aspirating this morning.  Very frail-Per RN and family hardly any oral intake over the past few days.  Long discussion with spouse at bedside-elects to transition to full comfort measures-given rapid decline-suspect life expectancy only to be a few weeks at best.  Have consulted social work to see if we can transition to residential hospice on discharge.  Will start morphine, benzodiazepines for comfort.  DVT Prophylaxis: SCD's  Code Status: DNR  Family Communication: Spouse at bedside  Disposition Plan: Remain inpatient-residential hospice on discharge  Antimicrobial agents: Anti-infectives (From admission, onward)   Start     Dose/Rate Route Frequency Ordered Stop   03/04/18 0000  cefTRIAXone (ROCEPHIN) 1 g in sodium chloride 0.9 % 100 mL IVPB     1 g 200 mL/hr over 30 Minutes Intravenous Every 24 hours 03/03/18 0534     03/03/18 0330  cefTRIAXone (ROCEPHIN) 1 g in sodium chloride 0.9 % 100 mL IVPB     1 g 200 mL/hr over 30 Minutes Intravenous  Once 03/03/18 0327 03/03/18 0650      Procedures: None  CONSULTS:  None  Time spent: 35 minutes-Greater than 50% of this time was spent in counseling, explanation of diagnosis, planning of further management, and coordination of care.  MEDICATIONS: Scheduled Meds: . allopurinol  300 mg Oral Daily  . bisoprolol  5 mg Oral Daily  . calcitRIOL  0.25 mcg Oral QODAY  . insulin aspart  0-9 Units Subcutaneous TID WC  . ipratropium-albuterol  3 mL Nebulization BID  . isosorbide dinitrate  10 mg Oral TID  . levothyroxine  175 mcg Oral Q0600  . pantoprazole sodium  40 mg Oral Daily  .  rosuvastatin  20 mg Oral Daily  . senna-docusate  1 tablet Oral QHS  . torsemide  30 mg Oral BID   Continuous Infusions: . cefTRIAXone (ROCEPHIN)  IV 1 g (03/04/18 0004)   PRN Meds:.acetaminophen, albuterol, ondansetron **OR** ondansetron (ZOFRAN) IV   PHYSICAL EXAM: Vital signs: Vitals:   03/03/18 2021 03/03/18 2127 03/04/18 0516 03/04/18 0819  BP:  112/79 121/69   Pulse: 75 69 66   Resp: 20 (!) 24 (!) 24   Temp:  98.3 F (36.8 C) 98.5 F (36.9 C)   TempSrc:   Axillary   SpO2: 93% 100% 100% 95%  Weight:      Height:       Filed Weights   03/03/18 0819  Weight: 102.7 kg   Body mass index is 33.44 kg/m.   General appearance : Gurgling-tachypneic-very frail HEENT: Atraumatic and Normocephalic Neck: supple Resp:Good air entry bilaterally, transmitted upper airway sounds-rales bibasilar CVS: S1 S2 irregular GI: Bowel sounds present, Non tender and not distended with no gaurding, rigidity or rebound.No organomegaly Extremities: B/L Lower Ext shows no edema, both legs are warm to touch Neurology: Very lethargic-limits exam. Musculoskeletal:No digital cyanosis Skin:No Rash, warm and dry Wounds:N/A  I have personally reviewed following labs and imaging studies  LABORATORY DATA: CBC: Recent Labs  Lab 03/03/18 0214 03/03/18 0252 03/04/18 0337  WBC 4.6  --   --   NEUTROABS 3.1  --   --   HGB 10.5* 11.6* 9.9*  HCT 36.8* 34.0* 33.2*  MCV 109.9*  --   --   PLT 135*  --   --     Basic Metabolic Panel: Recent Labs  Lab 03/03/18 0214 03/03/18 0252  NA 142 141  K 5.2* 5.1  CL 103  --   CO2 30  --   GLUCOSE 103*  --   BUN 82*  --   CREATININE 3.72*  --   CALCIUM 9.5  --     GFR: Estimated Creatinine Clearance: 19.3 mL/min (A) (by C-G formula based on SCr of 3.72 mg/dL (H)).  Liver Function Tests: No results for input(s): AST, ALT, ALKPHOS, BILITOT, PROT, ALBUMIN in the last 168 hours. No results for input(s): LIPASE, AMYLASE in the last 168 hours. No  results for input(s): AMMONIA in the last 168 hours.  Coagulation Profile: Recent Labs  Lab 03/03/18 0214  INR 1.49    Cardiac Enzymes: No results for input(s): CKTOTAL, CKMB, CKMBINDEX, TROPONINI in the last 168 hours.  BNP (last 3 results) No results for input(s): PROBNP in the last 8760 hours.  HbA1C: No results for input(s): HGBA1C in the last 72 hours.  CBG: Recent Labs  Lab 03/03/18 0941 03/03/18 1204 03/03/18 1723 03/03/18 2152 03/04/18 0758  GLUCAP 83 79 88 100* 70    Lipid Profile: No results for input(s): CHOL, HDL, LDLCALC, TRIG, CHOLHDL, LDLDIRECT in the last 72 hours.  Thyroid Function Tests: No results for input(s): TSH, T4TOTAL, FREET4, T3FREE, THYROIDAB in the last 72 hours.  Anemia Panel: No results for input(s): VITAMINB12, FOLATE, FERRITIN, TIBC, IRON, RETICCTPCT in the last 72 hours.  Urine analysis:    Component Value Date/Time   COLORURINE YELLOW 03/03/2018 0254   APPEARANCEUR CLOUDY (A) 03/03/2018 0254   LABSPEC 1.012 03/03/2018 0254   PHURINE 5.0 03/03/2018 0254  GLUCOSEU NEGATIVE 03/03/2018 0254   HGBUR LARGE (A) 03/03/2018 0254   BILIRUBINUR NEGATIVE 03/03/2018 [place] 03/03/2018 0254   PROTEINUR 30 (A) 03/03/2018 0254   UROBILINOGEN 0.2 06/17/2012 0622   NITRITE NEGATIVE 03/03/2018 0254   LEUKOCYTESUR MODERATE (A) 03/03/2018 0254    Sepsis Labs: Lactic Acid, Venous    Component Value Date/Time   LATICACIDVEN 2.01 (HH) 01/31/2018 1452    MICROBIOLOGY: Recent Results (from the past 240 hour(s))  Urine culture     Status: Abnormal (Preliminary result)   Collection Time: 03/03/18  2:54 AM  Result Value Ref Range Status   Specimen Description URINE, RANDOM  Final   Special Requests   Final    NONE Performed at [facility], [place] [address], [place], [place] [address]    Culture 40,000 COLONIES/mL PROTEUS MIRABILIS (A)  Final   Report Status PENDING  Incomplete  MRSA PCR Screening     Status: None     Collection Time: 03/03/18  9:26 AM  Result Value Ref Range Status   MRSA by PCR NEGATIVE NEGATIVE Final    Comment:        The GeneXpert MRSA Assay (FDA approved for NASAL specimens only), is one component of a comprehensive MRSA colonization surveillance program. It is not intended to diagnose MRSA infection nor to guide or monitor treatment for MRSA infections. Performed at [facility], [place] [address], [place], [place] [address]     RADIOLOGY STUDIES/RESULTS: Dg Chest 2 View  Result Date: 02/04/2018 CLINICAL DATA:  Shortness of breath EXAM: CHEST - 2 VIEW COMPARISON:  02/02/2018 FINDINGS: Cardiomegaly with vascular congestion. Large right pleural effusion with right lower lobe atelectasis or infiltrate. No focal opacity on the left. No acute bony abnormality. IMPRESSION: Continued large right effusion with right lower lobe atelectasis or infiltrate. Cardiomegaly, vascular congestion. Electronically Signed   By: Rolm Baptise M.D.   On: 02/04/2018 13:15   Dg Chest 2 View  Result Date: 02/02/2018 CLINICAL DATA:  Dyspnea. EXAM: CHEST - 2 VIEW COMPARISON:  Radiograph of January 31, 2018. FINDINGS: Stable cardiomegaly. Atherosclerosis of thoracic aorta is noted. No pneumothorax is noted. Left lung is clear. Moderate right pleural effusion is noted with probable underlying atelectasis or infiltrate. Bony thorax is unremarkable. IMPRESSION: Moderate right pleural effusion with probable underlying atelectasis or infiltrate. Aortic Atherosclerosis (ICD10-I70.0). Electronically Signed   By: Marijo Conception, M.D.   On: 02/02/2018 19:40   Ct Head Wo Contrast  Result Date: 02/07/2018 CLINICAL DATA:  Unexplained altered level of consciousness EXAM: CT HEAD WITHOUT CONTRAST TECHNIQUE: Contiguous axial images were obtained from the base of the skull through the vertex without intravenous contrast. COMPARISON:  12/02/2017 FINDINGS: Brain: No evidence of acute infarction, hemorrhage,  hydrocephalus, extra-axial collection or mass lesion/mass effect. On a few slices the variable density of the pons is attributed to streak artifact based on reformats. Age normal brain volume. Vascular: Atherosclerotic calcification Skull: Negative Sinuses/Orbits: Bilateral cataract resection. IMPRESSION: Unremarkable head CT for age. Electronically Signed   By: Monte Fantasia M.D.   On: 02/07/2018 15:20   Nm Tumor Localization W Spect  Result Date: 02/11/2018 CLINICAL DATA:  HEART FAILURE. CONCERN FOR CARDIAC AMYLOIDOSIS. 78 year old male. Acute on chronic diastolic congestive heart failure. EXAM: NUCLEAR MEDICINE TUMOR LOCALIZATION. PYP CARDIAC AMYLOIDOSIS SCAN WITH SPECT TECHNIQUE: Following intravenous administration of radiopharmaceutical, anterior planar images of the chest were obtained. Regions of interest were placed on the heart and contralateral chest wall for  quantitative assessment. Additional SPECT imaging of the chest was obtained. RADIOPHARMACEUTICALS:  Stone mCi TECHNETIUM 99 PYROPHOSPHATE FINDINGS: Planar Visual assessment: Anterior planar imaging demonstrates radiotracer uptake within the heart less than uptake within the adjacent ribs (Grade 1). Quantitative assessment : Quantitative assessment of the cardiac uptake compared to the contralateral chest wall is equal to 1.1 (H/CL = 1.1). SPECT assessment: SPECT imaging of the chest demonstrates minimal radiotracer accumulation within the LEFT ventricle. 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 Electronically Signed   By: Suzy Bouchard M.D.   On: 02/11/2018 07:53   Dg Chest Port 1 View  Result Date: 03/03/2018 CLINICAL DATA:  Increased shortness of breath tonight. EXAM: PORTABLE CHEST 1 VIEW COMPARISON:  Most recent chest radiograph 02/04/2018 FINDINGS: Cardiomegaly is similar to prior. Aortic atherosclerosis. Moderate to large right pleural effusion  associated basilar airspace disease, not significantly changed allowing for differences in technique. Muscular congestion with mild pulmonary edema, slight progression from prior. No pneumothorax. IMPRESSION: Moderate to large right pleural effusion with associated basilar airspace disease/atelectasis, not significantly changed from 02/04/2018 exam. Slight progression of pulmonary edema. Stable cardiomegaly.  Aortic Atherosclerosis (ICD10-I70.0). Electronically Signed   By: Keith Rake M.D.   On: 03/03/2018 02:39     LOS: 1 day   Oren Binet, MD  [facility]  If 7PM-7AM, please contact night-coverage  Please page via [email or web address] [email or web address]-click on MD name and type text message  03/04/2018, 11:46 AM

## 2018-03-04 NOTE — Telephone Encounter (Signed)
LMOM to return call.

## 2018-03-04 NOTE — Consult Note (Signed)
   [facility] Inpatient Consult   03/04/2018  Omar Lambert 03-09-1940 [id number]    Patient screened for potential [facility] Care Management services due to unplanned readmission risk score of 33% (extreme) and multiple hospitalizations.  Chart reviewed. Noted [name] was active with [facility] prior to hospitalization. He is slated for discharge to residential hospice/ [facility] today.   No identifiable [facility] Care Management needs.    Marthenia Rolling, MSN-Ed, RN,BSN [facility] Liaison [phone]

## 2018-03-04 NOTE — Discharge Summary (Signed)
PATIENT DETAILS Name: Omar Lambert Age: 78 y.o. Sex: male Date of Birth: 1940/06/05 MRN: [id number]. Admitting Physician: Rise Patience, MD IOE:VOJJ, Rexene Edison, DO  Admit Date: 03/03/2018 Discharge date: 03/04/2018  Recommendations for Outpatient Follow-up:  1. Optimize comfort care  Admitted From:  Home with Hospice  Disposition: Residential Hospice     Home Health: No  Equipment/Devices: None  Discharge Condition: Stable  CODE STATUS:  DNR  Diet recommendation:  Regular   Brief Summary: See H&P, Labs, Consult and Test reports for all details in brief, Patient is a 78 y.o. 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  See below for further details  Brief Hospital Course: Acute hypoxic and hypercarbic respiratory failure: Multifactorial-likely secondary to worsening deconditioning/muscle fatigue in the setting of failure to thrive syndrome in a background of decompensated diastolic heart failure and COPD. Suspect patient also has some amount of aspiration that is contributing. Patient is clearly deteriorating-discussed with spouse at bedside-elects to transition to full comfort measures.  Acute on chronic diastolic heart failure: Volume status is improved-but patient is clearly deteriorating with your failure to thrive syndrome.  Supportive care only at this time-spouse has elected to transition to full comfort measures.  Acute metabolic encephalopathy: Secondary to hypoxia/hypercarbia-supportive care only.  Large right-sided pleural effusion: Secondary to CHF-no further work-up-patient comfort care status.  Hematuria with complicated UTI: Hematuria is resolved-since patient deteriorating with supportive care-and family has elected to  transition to full comfort measures-stop all antimicrobial therapy.  Atrial fibrillation: Since family has elected to pursue comfort measures-we will not plan on resuming anticoagulation.  Supportive care only.  CKD stage IV: Thought to be secondary to cardiorenal syndrome.  Comfort measures in place-supportive care only.  COPD: Continue bronchodilators for comfort.  Hypothyroidism: Since transition to comfort measures-stop Synthroid.  Dementia  Failure to thrive syndrome/severe deconditioning/palliative care: Rapidly deteriorating-seems to be accumulating secretions and possibly aspirating this morning.  Very frail-Per RN and family hardly any oral intake over the past few days.  Long discussion with spouse at bedside-elects to transition to full comfort measures-given rapid decline-suspect life expectancy only to be a few weeks at best.  Have consulted social work to see if we can transition to residential hospice on discharge.  We will start Dilaudid, as needed benzodiazepines for comfort.  Procedures/Studies: None  Discharge Diagnoses:  Principal Problem:   Acute respiratory failure with hypoxia and hypercapnia (HCC) Active Problems:   COPD exacerbation (HCC)   OSA on CPAP   Persistent atrial fibrillation   Alteration in anticoagulation   Essential hypertension, benign   Hypothyroidism   Diabetes mellitus with renal complications (HCC)   Acute respiratory failure with hypoxia (HCC)   Acute respiratory failure with hypoxia and hypercarbia [facility])   Discharge Instructions:  Activity:  As tolerated with Full fall precautions use walker/cane & assistance as needed   Discharge Instructions    Diet - low sodium heart healthy   Complete by:  As directed    Increase activity slowly   Complete by:  As directed      Allergies as of 03/04/2018   No Known Allergies     Medication List    STOP taking these medications   acetaminophen 325 MG tablet Commonly known as:   TYLENOL   allopurinol 300 MG tablet Commonly known as:  ZYLOPRIM   ALPRAZolam 0.5 MG tablet Commonly known as:  XANAX   bisoprolol 5 MG tablet Commonly known as:  ZEBETA   calcitRIOL 0.25 MCG capsule Commonly known as:  ROCALTROL   CENTRUM SILVER ADULT 50+ PO   fluticasone 50 MCG/ACT nasal spray Commonly known as:  FLONASE   isosorbide mononitrate 30 MG 24 hr tablet Commonly known as:  IMDUR   KLOR-CON M20 20 MEQ tablet Generic drug:  potassium chloride SA   levothyroxine 175 MCG tablet Commonly known as:  SYNTHROID, LEVOTHROID   oxycodone 5 MG capsule Commonly known as:  OXY-IR   OXYGEN   pantoprazole 40 MG tablet Commonly known as:  PROTONIX   polyethylene glycol packet Commonly known as:  MIRALAX / GLYCOLAX   rosuvastatin 20 MG tablet Commonly known as:  CRESTOR   senna-docusate 8.6-50 MG tablet Commonly known as:  Senokot-S   torsemide 20 MG tablet Commonly known as:  DEMADEX   umeclidinium-vilanterol 62.5-25 MCG/INH Aepb Commonly known as:  ANORO ELLIPTA   UNABLE TO FIND   warfarin 5 MG tablet Commonly known as:  COUMADIN     TAKE these medications   albuterol (2.5 MG/3ML) 0.083% nebulizer solution Commonly known as:  PROVENTIL Take 3 mLs (2.5 mg total) by nebulization every 2 (two) hours as needed for wheezing. What changed:  See the new instructions.   glycopyrrolate 0.2 MG/ML injection Commonly known as:  ROBINUL Inject 1 mL (0.2 mg total) into the vein every 4 (four) hours.   HYDROmorphone HCl 1 MG/ML Liqd Commonly known as:  DILAUDID Take 0.5 mLs (0.5 mg total) by mouth every 4 (four) hours.   LORazepam 2 MG/ML injection Commonly known as:  ATIVAN Inject 0.5 mLs (1 mg total) into the vein every 4 (four) hours as needed for sedation.      Follow-up Information    [name], [name] L, DO Follow up.   Specialty:  Geriatric Medicine Why:  if needed Contact information: [address] [place] [address] [phone]           No Known Allergies  Consultations:   None   Other Procedures/Studies: Dg Chest 2 View  Result Date: 02/04/2018 CLINICAL DATA:  Shortness of breath EXAM: CHEST - 2 VIEW COMPARISON:  02/02/2018 FINDINGS: Cardiomegaly with vascular congestion. Large right pleural effusion with right lower lobe atelectasis or infiltrate. No focal opacity on the left. No acute bony abnormality. IMPRESSION: Continued large right effusion with right lower lobe atelectasis or infiltrate. Cardiomegaly, vascular congestion. Electronically Signed   By: Rolm Baptise M.D.   On: 02/04/2018 13:15   Dg Chest 2 View  Result Date: 02/02/2018 CLINICAL DATA:  Dyspnea. EXAM: CHEST - 2 VIEW COMPARISON:  Radiograph of January 31, 2018. FINDINGS: Stable cardiomegaly. Atherosclerosis of thoracic aorta is noted. No pneumothorax is noted. Left lung is clear. Moderate right pleural effusion is noted with probable underlying atelectasis or infiltrate. Bony thorax is unremarkable. IMPRESSION: Moderate right pleural effusion with probable underlying atelectasis or infiltrate. Aortic Atherosclerosis (ICD10-I70.0). Electronically Signed   By: Marijo Conception, M.D.   On: 02/02/2018 19:40   Ct Head Wo Contrast  Result Date: 02/07/2018 CLINICAL DATA:  Unexplained altered level of consciousness EXAM: CT HEAD WITHOUT CONTRAST TECHNIQUE: Contiguous axial images were obtained from the base of the skull through the vertex without intravenous contrast. COMPARISON:  12/02/2017 FINDINGS: Brain: No evidence of acute infarction, hemorrhage, hydrocephalus, extra-axial collection or mass lesion/mass effect. On a few slices the variable density of the  pons is attributed to streak artifact based on reformats. Age normal brain volume. Vascular: Atherosclerotic calcification Skull: Negative Sinuses/Orbits: Bilateral cataract resection. IMPRESSION: Unremarkable head CT for age. Electronically Signed   By: Monte Fantasia M.D.   On: 02/07/2018 15:20   Nm Tumor  Localization W Spect  Result Date: 02/11/2018 CLINICAL DATA:  HEART FAILURE. CONCERN FOR CARDIAC AMYLOIDOSIS. 78 year old male. Acute on chronic diastolic congestive heart failure. EXAM: NUCLEAR MEDICINE TUMOR LOCALIZATION. PYP CARDIAC AMYLOIDOSIS SCAN WITH SPECT TECHNIQUE: Following intravenous administration of radiopharmaceutical, anterior planar images of the chest were obtained. Regions of interest were placed on the heart and contralateral chest wall for quantitative assessment. Additional SPECT imaging of the chest was obtained. RADIOPHARMACEUTICALS:  Stone mCi TECHNETIUM 99 PYROPHOSPHATE FINDINGS: Planar Visual assessment: Anterior planar imaging demonstrates radiotracer uptake within the heart less than uptake within the adjacent ribs (Grade 1). Quantitative assessment : Quantitative assessment of the cardiac uptake compared to the contralateral chest wall is equal to 1.1 (H/CL = 1.1). SPECT assessment: SPECT imaging of the chest demonstrates minimal radiotracer accumulation within the LEFT ventricle. 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 Electronically Signed   By: Suzy Bouchard M.D.   On: 02/11/2018 07:53   Dg Chest Port 1 View  Result Date: 03/03/2018 CLINICAL DATA:  Increased shortness of breath tonight. EXAM: PORTABLE CHEST 1 VIEW COMPARISON:  Most recent chest radiograph 02/04/2018 FINDINGS: Cardiomegaly is similar to prior. Aortic atherosclerosis. Moderate to large right pleural effusion associated basilar airspace disease, not significantly changed allowing for differences in technique. Muscular congestion with mild pulmonary edema, slight progression from prior. No pneumothorax. IMPRESSION: Moderate to large right pleural effusion with associated basilar airspace disease/atelectasis, not significantly changed from 02/04/2018 exam. Slight progression of pulmonary edema. Stable cardiomegaly.  Aortic  Atherosclerosis (ICD10-I70.0). Electronically Signed   By: Keith Rake M.D.   On: 03/03/2018 02:39      TODAY-DAY OF DISCHARGE:  Subjective:   Omar Lambert today remains very lethargic-mumbling incoherently.  Objective:   Blood pressure 121/69, pulse 66, temperature 98.5 F (36.9 C), temperature source Axillary, resp. rate (!) 24, height 5\' 9"  (1.753 m), weight 102.7 kg, SpO2 95 %.  Intake/Output Summary (Last 24 hours) at 03/04/2018 1334 Last data filed at 03/04/2018 0517 Gross per 24 hour  Intake 100 ml  Output 2650 ml  Net -2550 ml   Filed Weights   03/03/18 0819  Weight: 102.7 kg    Exam: General appearance : Gurgling-tachypneic-very frail HEENT: Atraumatic and Normocephalic Neck: supple Resp:Good air entry bilaterally, transmitted upper airway sounds-rales bibasilar CVS: S1 S2 irregular GI: Bowel sounds present, Non tender and not distended with no gaurding, rigidity or rebound.No organomegaly Extremities: B/L Lower Ext shows no edema, both legs are warm to touch Neurology: Very lethargic-limits exam. Musculoskeletal:No digital cyanosis Skin:No Rash, warm and dry Wounds:N/A   PERTINENT RADIOLOGIC STUDIES: Dg Chest 2 View  Result Date: 02/04/2018 CLINICAL DATA:  Shortness of breath EXAM: CHEST - 2 VIEW COMPARISON:  02/02/2018 FINDINGS: Cardiomegaly with vascular congestion. Large right pleural effusion with right lower lobe atelectasis or infiltrate. No focal opacity on the left. No acute bony abnormality. IMPRESSION: Continued large right effusion with right lower lobe atelectasis or infiltrate. Cardiomegaly, vascular congestion. Electronically Signed   By: Rolm Baptise M.D.   On: 02/04/2018 13:15   Dg Chest 2 View  Result Date: 02/02/2018 CLINICAL DATA:  Dyspnea. EXAM: CHEST - 2 VIEW COMPARISON:  Radiograph of January 31, 2018. FINDINGS: Stable cardiomegaly. Atherosclerosis of thoracic aorta is noted. No pneumothorax is noted. Left lung is clear. Moderate  right pleural effusion is noted with probable underlying atelectasis or infiltrate. Bony thorax is unremarkable. IMPRESSION: Moderate right pleural effusion with probable underlying atelectasis or infiltrate. Aortic Atherosclerosis (ICD10-I70.0). Electronically Signed   By: Marijo Conception, M.D.   On: 02/02/2018 19:40   Ct Head Wo Contrast  Result Date: 02/07/2018 CLINICAL DATA:  Unexplained altered level of consciousness EXAM: CT HEAD WITHOUT CONTRAST TECHNIQUE: Contiguous axial images were obtained from the base of the skull through the vertex without intravenous contrast. COMPARISON:  12/02/2017 FINDINGS: Brain: No evidence of acute infarction, hemorrhage, hydrocephalus, extra-axial collection or mass lesion/mass effect. On a few slices the variable density of the pons is attributed to streak artifact based on reformats. Age normal brain volume. Vascular: Atherosclerotic calcification Skull: Negative Sinuses/Orbits: Bilateral cataract resection. IMPRESSION: Unremarkable head CT for age. Electronically Signed   By: Monte Fantasia M.D.   On: 02/07/2018 15:20   Nm Tumor Localization W Spect  Result Date: 02/11/2018 CLINICAL DATA:  HEART FAILURE. CONCERN FOR CARDIAC AMYLOIDOSIS. 78 year old male. Acute on chronic diastolic congestive heart failure. EXAM: NUCLEAR MEDICINE TUMOR LOCALIZATION. PYP CARDIAC AMYLOIDOSIS SCAN WITH SPECT TECHNIQUE: Following intravenous administration of radiopharmaceutical, anterior planar images of the chest were obtained. Regions of interest were placed on the heart and contralateral chest wall for quantitative assessment. Additional SPECT imaging of the chest was obtained. RADIOPHARMACEUTICALS:  Stone mCi TECHNETIUM 99 PYROPHOSPHATE FINDINGS: Planar Visual assessment: Anterior planar imaging demonstrates radiotracer uptake within the heart less than uptake within the adjacent ribs (Grade 1). Quantitative assessment : Quantitative assessment of the cardiac uptake compared to the  contralateral chest wall is equal to 1.1 (H/CL = 1.1). SPECT assessment: SPECT imaging of the chest demonstrates minimal radiotracer accumulation within the LEFT ventricle. 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 Electronically Signed   By: Suzy Bouchard M.D.   On: 02/11/2018 07:53   Dg Chest Port 1 View  Result Date: 03/03/2018 CLINICAL DATA:  Increased shortness of breath tonight. EXAM: PORTABLE CHEST 1 VIEW COMPARISON:  Most recent chest radiograph 02/04/2018 FINDINGS: Cardiomegaly is similar to prior. Aortic atherosclerosis. Moderate to large right pleural effusion associated basilar airspace disease, not significantly changed allowing for differences in technique. Muscular congestion with mild pulmonary edema, slight progression from prior. No pneumothorax. IMPRESSION: Moderate to large right pleural effusion with associated basilar airspace disease/atelectasis, not significantly changed from 02/04/2018 exam. Slight progression of pulmonary edema. Stable cardiomegaly.  Aortic Atherosclerosis (ICD10-I70.0). Electronically Signed   By: Keith Rake M.D.   On: 03/03/2018 02:39     PERTINENT LAB RESULTS: CBC: Recent Labs    03/03/18 0214 03/03/18 0252 03/04/18 0337  WBC 4.6  --   --   HGB 10.5* 11.6* 9.9*  HCT 36.8* 34.0* 33.2*  PLT 135*  --   --    CMET CMP     Component Value Date/Time   NA 141 03/03/2018 0252   NA 138 12/26/2017   K 5.1 03/03/2018 0252   CL 103 03/03/2018 0214   CO2 30 03/03/2018 0214   GLUCOSE 103 (H) 03/03/2018 0214   BUN 82 (H) 03/03/2018 0214   BUN 24 (A) 12/26/2017   CREATININE 3.72 (H) 03/03/2018 0214   CREATININE 2.11 (H) 07/09/2017 1345   CALCIUM 9.5 03/03/2018 0214   PROT 8.1 01/31/2018 1226   PROT 7.2 02/18/2017 0944  ALBUMIN 3.7 01/31/2018 1226   ALBUMIN 4.0 02/18/2017 0944   AST 34 01/31/2018 1226   ALT 18 01/31/2018 1226   ALKPHOS 142 (H) 01/31/2018  1226   BILITOT 1.2 01/31/2018 1226   BILITOT 0.5 02/18/2017 0944   GFRNONAA 15 (L) 03/03/2018 0214   GFRNONAA 29 (L) 07/09/2017 1345   GFRAA 17 (L) 03/03/2018 0214   GFRAA 34 (L) 07/09/2017 1345    GFR Estimated Creatinine Clearance: 19.3 mL/min (A) (by C-G formula based on SCr of 3.72 mg/dL (H)). No results for input(s): LIPASE, AMYLASE in the last 72 hours. No results for input(s): CKTOTAL, CKMB, CKMBINDEX, TROPONINI in the last 72 hours. Invalid input(s): POCBNP No results for input(s): DDIMER in the last 72 hours. No results for input(s): HGBA1C in the last 72 hours. No results for input(s): CHOL, HDL, LDLCALC, TRIG, CHOLHDL, LDLDIRECT in the last 72 hours. No results for input(s): TSH, T4TOTAL, T3FREE, THYROIDAB in the last 72 hours.  Invalid input(s): FREET3 No results for input(s): VITAMINB12, FOLATE, FERRITIN, TIBC, IRON, RETICCTPCT in the last 72 hours. Coags: Recent Labs    03/03/18 0214  INR 1.49   Microbiology: Recent Results (from the past 240 hour(s))  Urine culture     Status: Abnormal (Preliminary result)   Collection Time: 03/03/18  2:54 AM  Result Value Ref Range Status   Specimen Description URINE, RANDOM  Final   Special Requests   Final    NONE Performed at [facility], [address] [place], [place] [address]    Culture 40,000 COLONIES/mL PROTEUS MIRABILIS (A)  Final   Report Status PENDING  Incomplete  MRSA PCR Screening     Status: None   Collection Time: 03/03/18  9:26 AM  Result Value Ref Range Status   MRSA by PCR NEGATIVE NEGATIVE Final    Comment:        The GeneXpert MRSA Assay (FDA approved for NASAL specimens only), is one component of a comprehensive MRSA colonization surveillance program. It is not intended to diagnose MRSA infection nor to guide or monitor treatment for MRSA infections. Performed at [facility], [place] [address], [place], [place] [address]     FURTHER DISCHARGE INSTRUCTIONS:  Get Medicines  reviewed and adjusted: Please take all your medications with you for your next visit with your Primary MD  Laboratory/radiological data: Please request your Primary MD to go over all hospital tests and procedure/radiological results at the follow up, please ask your Primary MD to get all Hospital records sent to his/her office.  In some cases, they will be blood work, cultures and biopsy results pending at the time of your discharge. Please request that your primary care M.D. goes through all the records of your hospital data and follows up on these results.  Also Note the following: If you experience worsening of your admission symptoms, develop shortness of breath, life threatening emergency, suicidal or homicidal thoughts you must seek medical attention immediately by calling 911 or calling your MD immediately  if symptoms less severe.  You must read complete instructions/literature along with all the possible adverse reactions/side effects for all the Medicines you take and that have been prescribed to you. Take any new Medicines after you have completely understood and accpet all the possible adverse reactions/side effects.   Do not drive when taking Pain medications or sleeping medications (Benzodaizepines)  Do not take more than prescribed Pain, Sleep and Anxiety Medications. It is not advisable to combine anxiety,sleep and pain  medications without talking with your primary care practitioner  Special Instructions: If you have smoked or chewed Tobacco  in the last 2 yrs please stop smoking, stop any regular Alcohol  and or any Recreational drug use.  Wear Seat belts while driving.  Please note: You were cared for by a hospitalist during your hospital stay. Once you are discharged, your primary care physician will handle any further medical issues. Please note that NO REFILLS for any discharge medications will be authorized once you are discharged, as it is imperative that you return to  your primary care physician (or establish a relationship with a primary care physician if you do not have one) for your post hospital discharge needs so that they can reassess your need for medications and monitor your lab values.  Total Time spent coordinating discharge including counseling, education and face to face time equals 35 minutes.  SignedOren Binet 03/04/2018 1:34 PM

## 2018-03-04 NOTE — Telephone Encounter (Signed)
Omar Lambert stated that she would like to take the full 12 weeks.

## 2018-03-04 NOTE — Progress Notes (Signed)
[facility]  GIP RN Visit @ 7409  This is a related and covered admission with a hospice diagnosis of hypertensive heart disease.  EMS was activated after pt had fallen and wife was unable to help him get up.  He is admitted with c/o hematuria, acute respiratory failure and suspected UTI.  Met with pt, spouse and daughter at the bedside.  Pt opens eyes, delayed response of "hey". Spouse states that he is still tired.  She states that he was able to get to [facility] several times yesterday.  MD rounded, advised the family of the futility of continued antibiotics and diagnostics.  Pt not eating, only 10% of occasional meals. Family agrees, and they want him to be as comfortable as possible.    V/S: 98.5 ax, 121/69, HR 66, RR 24, SPO2 95% 2lpm [place]  I&O:  100/2650  Abnormal Labs:  hgb 9.9, HCT 33.2 (only labs collected today)  Chest xray: IMPRESSION: 1/27             Moderate to large right pleural effusion with associated basilar             airspace disease/atelectasis, not significantly changed from             02/04/2018 exam. Slight progression of pulmonary edema.  Hematuria:  Rocephin 1g qd, now d/c'd  UTI:  Rocephin 1g IV qd, now d/c'd  ARF:  [place] 2 lpm  Discharge planning:  Family requesting residential hospice at [facility] for EOL needs.  Awaiting to determine bed availability.  LCSW Nadyia aware of plan.  PCG:  Updated at the bedside  IDT:  Updated  Please call with any hospice related questions or concerns Venia Carbon BSN, RN [facility] Liaison (direct # listed in [place] (main #)

## 2018-03-04 NOTE — Progress Notes (Signed)
LATE ENTRY:  [facility] [place] LCSW Visit  LCSW completed PRN visit at [facility] where Pt was admitted this morning. LCSW first spoke with [facility] RN Liaison, Anderson Malta, after her visit with Pt. LCSW visited in Pt room where multiple family members were present. LCSW introduced self at end of visit though mostly engaged with Pt wife, Earlie Server, at Pt bedside. Pt did not appear to be fully oriented and was minimally able to engage with LCSW. Pt was difficult to understand as well. LCSW discussed recent events with Pt status with Earlie Server and her own self care. LCSW encouraged her rest as she notes plan to go home tonight to rest. LCSW noted belief per Anderson Malta, [facility] RN Liaison, that Pt could be discharged in a couple days though this is not confirmed. LCSW spoke with Earlie Server regarding discharge plans after she inquired about him going to [facility] post discharge if needed based on his care needs. She discussed her physical limitations due to hx of back surgery. LCSW explained eligibility for [facility] admission of two weeks or less prognosis/noted belief that this is not the case for Pt yet and [name] agreed. Conversation held with Earlie Server regarding facility placement for Pt and she denies knowledge of long term care insurance but notes plan to call [employer] to inquire. LCSW inquired about Medicaid and she notes Pt does not have this/LCSW noted that hospital staff could assist with process and noted that placement is easier from the hospital than from home. [name] confirmed plan to take things a day at a time/keep Pt at home if possible. LCSW agreed to speak with hospital RN and case manager post visit and discussed follow up to occur by RN and LCSW after Pt discharged home if he is discharged home. She notes agreement. Family noted need for more chairs in the room and LCSW to alert staff. LCSW alerted SW, Nadyia, via text later in the day today. LCSW spoke with floor RN, Aldona Bar, and  with SW, Nadyia, before leaving the hospital. Wilnette Kales noted inability to help with Medicaid application if Pt already has [employer]. LCSW noted need to correct LCSW mistake by calling Pt spouse later today/she notes she can follow up with family as well.  LCSW to continue to follow up with [facility] RN Liaisons until Pt is discharged.  Thank you,  Christena Deem, [place] Clinical Social Worker [facility] [place] ext: [phone]

## 2018-03-04 NOTE — Progress Notes (Signed)
[facility])  Met with spouse Earlie Server to complete necessary registration paperwork for transfer to [facility].  Dr. Tomasa Hosteller to assume care per request of family.  Please use [id number] for transport to [facility].  RN staff:  Please call report to [phone]  Venia Carbon BSN, RN [facility] Liaison (listed in [place]) [phone]

## 2018-03-05 ENCOUNTER — Other Ambulatory Visit: Payer: Self-pay | Admitting: Cardiovascular Disease

## 2018-03-05 LAB — URINE CULTURE: Culture: 40000 — AB

## 2018-03-08 DEATH — deceased

## 2018-03-09 NOTE — Telephone Encounter (Signed)
Form completed today and will be available for pick-up first thing Monday.

## 2018-03-10 NOTE — Telephone Encounter (Signed)
Wife notified and will pick up forms today. Aware of charge.  Forms placed up front for pick up. Copies sent for scanning.

## 2018-03-11 ENCOUNTER — Other Ambulatory Visit: Payer: Self-pay | Admitting: Internal Medicine

## 2018-03-11 DIAGNOSIS — G4733 Obstructive sleep apnea (adult) (pediatric): Secondary | ICD-10-CM | POA: Diagnosis not present

## 2018-05-23 ENCOUNTER — Other Ambulatory Visit: Payer: Self-pay | Admitting: Internal Medicine

## 2018-05-23 DIAGNOSIS — M1A9XX0 Chronic gout, unspecified, without tophus (tophi): Secondary | ICD-10-CM

## 2018-06-06 ENCOUNTER — Ambulatory Visit: Payer: Self-pay

## 2018-06-06 ENCOUNTER — Encounter: Payer: Self-pay | Admitting: Family

## 2018-11-20 IMAGING — DX DG CHEST 2V
2 series · 2 of 2 positions shown · non-contrast
Comparison: August 29, 2017

ADDENDUM:
I discussed this case with Dr. Estephany. He informs me that the patient
has had previous right pleural effusion. On review of the current
images and comparison with a prior study from July 09, 2017, it is
felt that the current opacity in the right base most likely
represents a right pleural effusion as opposed to pneumonia, despite
lack of meniscus appearance on the lateral view.

--
CLINICAL DATA: Chronic shortness of breath
EXAM:
CHEST - 2 VIEW

[chest pa]
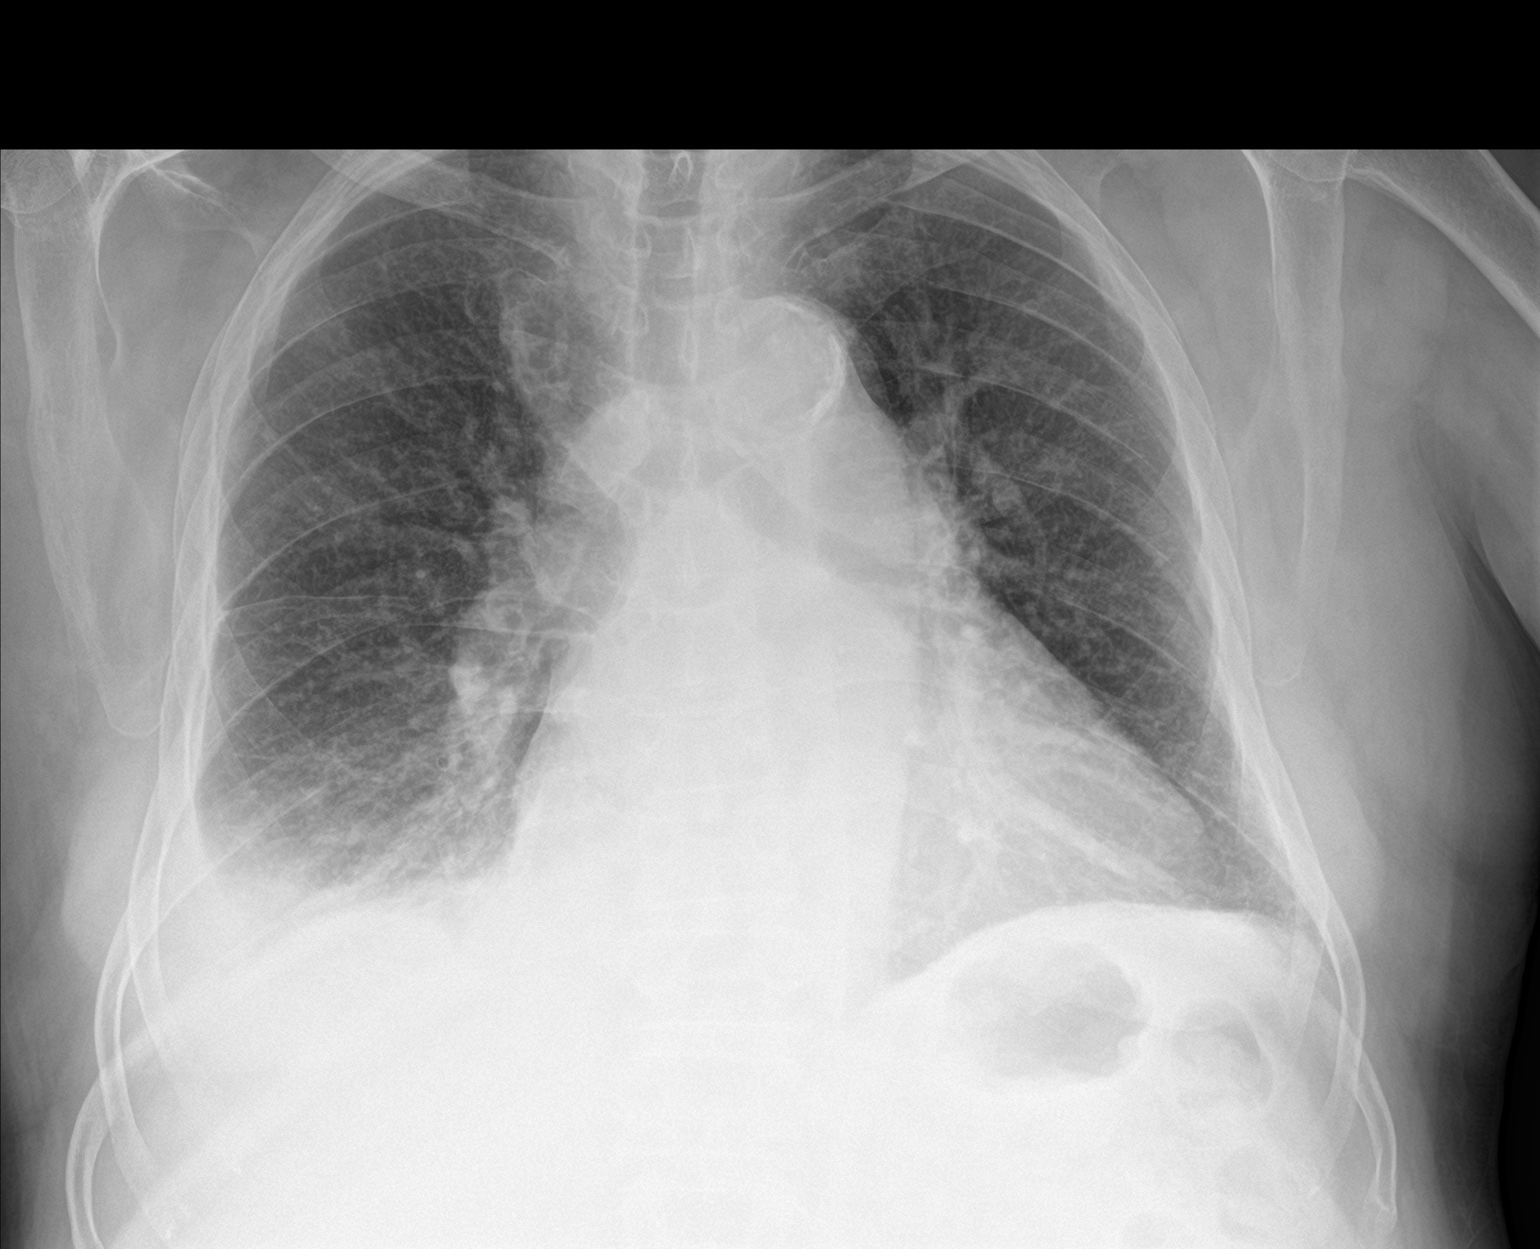

[chest lat]
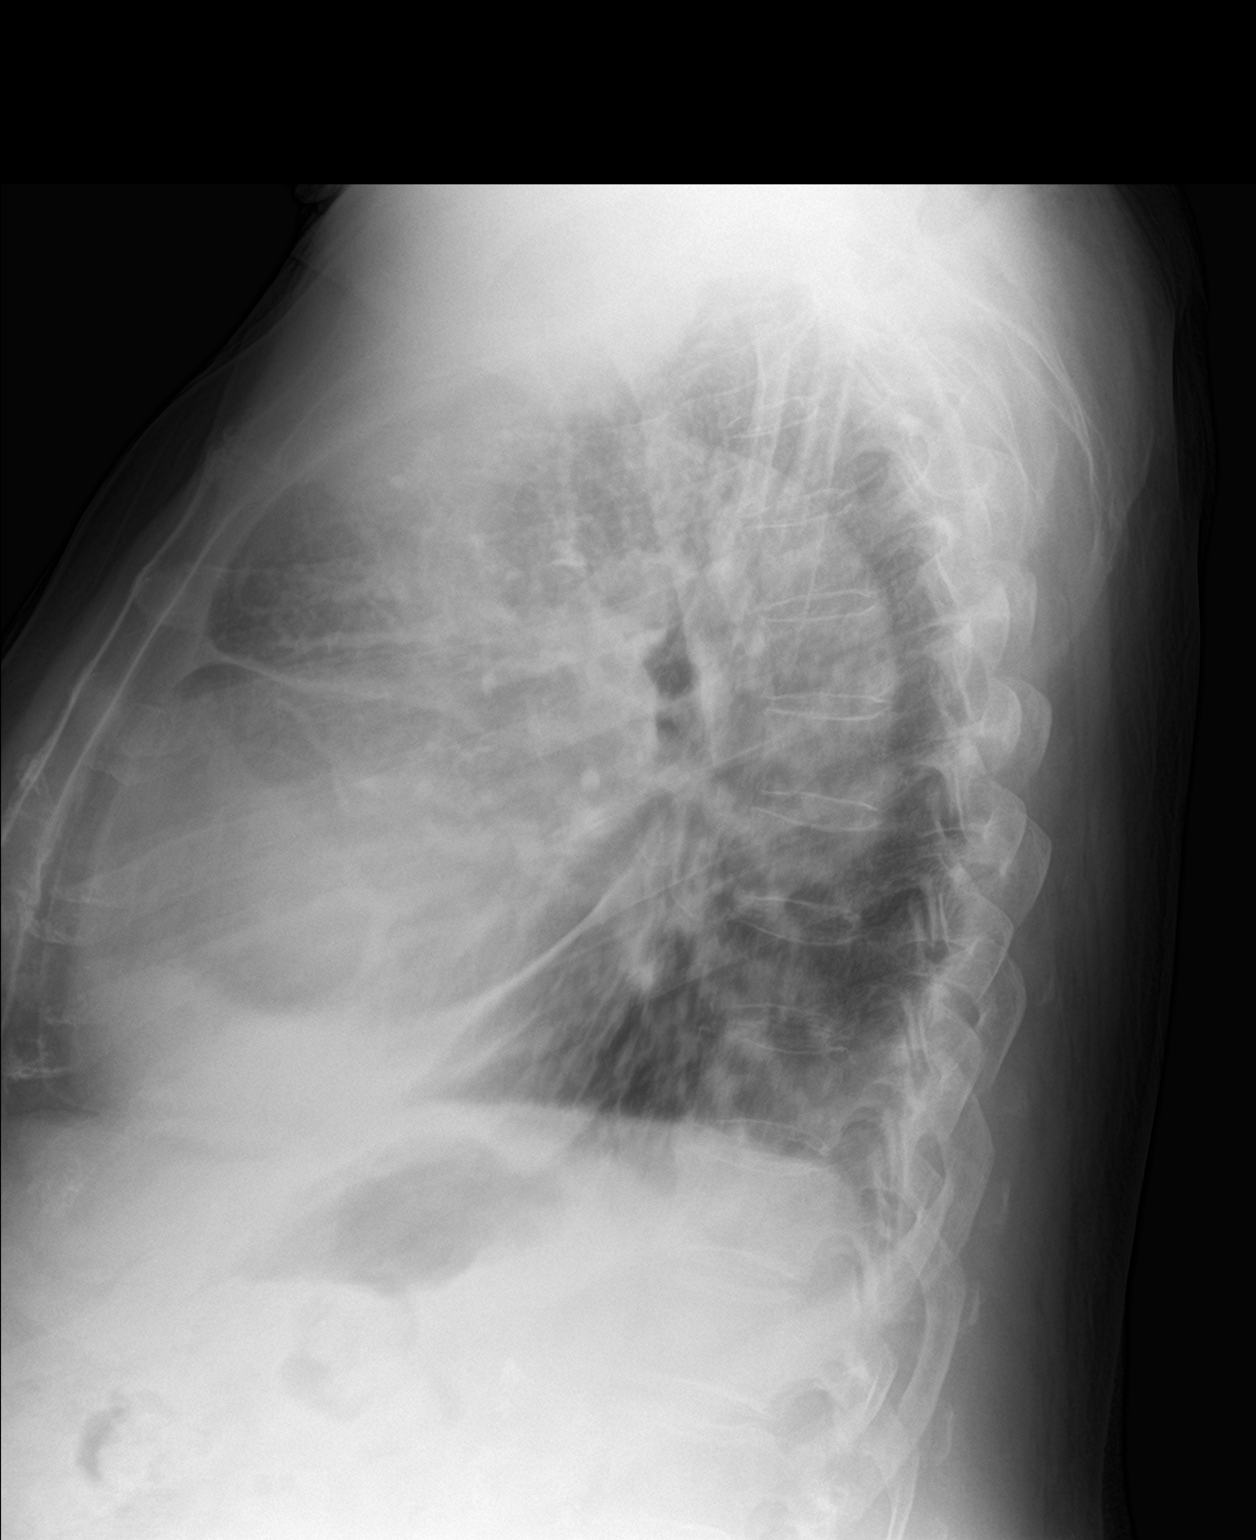

[2 of 2 positions shown; findings below may reference images not displayed]

FINDINGS: There is consolidation in the lateral right base with small right
pleural effusion. Lungs elsewhere are clear. Heart is mildly
enlarged with pulmonary vascularity normal. No adenopathy. There is
aortic atherosclerosis. No evident bone lesions.
IMPRESSION: Consolidation felt to represent pneumonia lateral right base with
small right pleural effusion. Lungs elsewhere clear. Stable cardiac
prominence. There is aortic atherosclerosis.

Aortic Atherosclerosis ([id number].X).

Followup PA and lateral chest radiographs recommended in 3-4 weeks
following trial of antibiotic therapy to ensure resolution and
exclude underlying malignancy.

These results will be called to the ordering clinician or
representative by the Radiologist Assistant, and communication
documented in the PACS or zVision Dashboard.

## 2019-02-25 IMAGING — DX DG CHEST 2V
2 series · 2 of 2 positions shown · non-contrast
Comparison: Radiographs December 04, 2017.

CLINICAL DATA: Shortness of breath.

EXAM:
CHEST - 2 VIEW

[dg chest 2 view (1 of 2)]
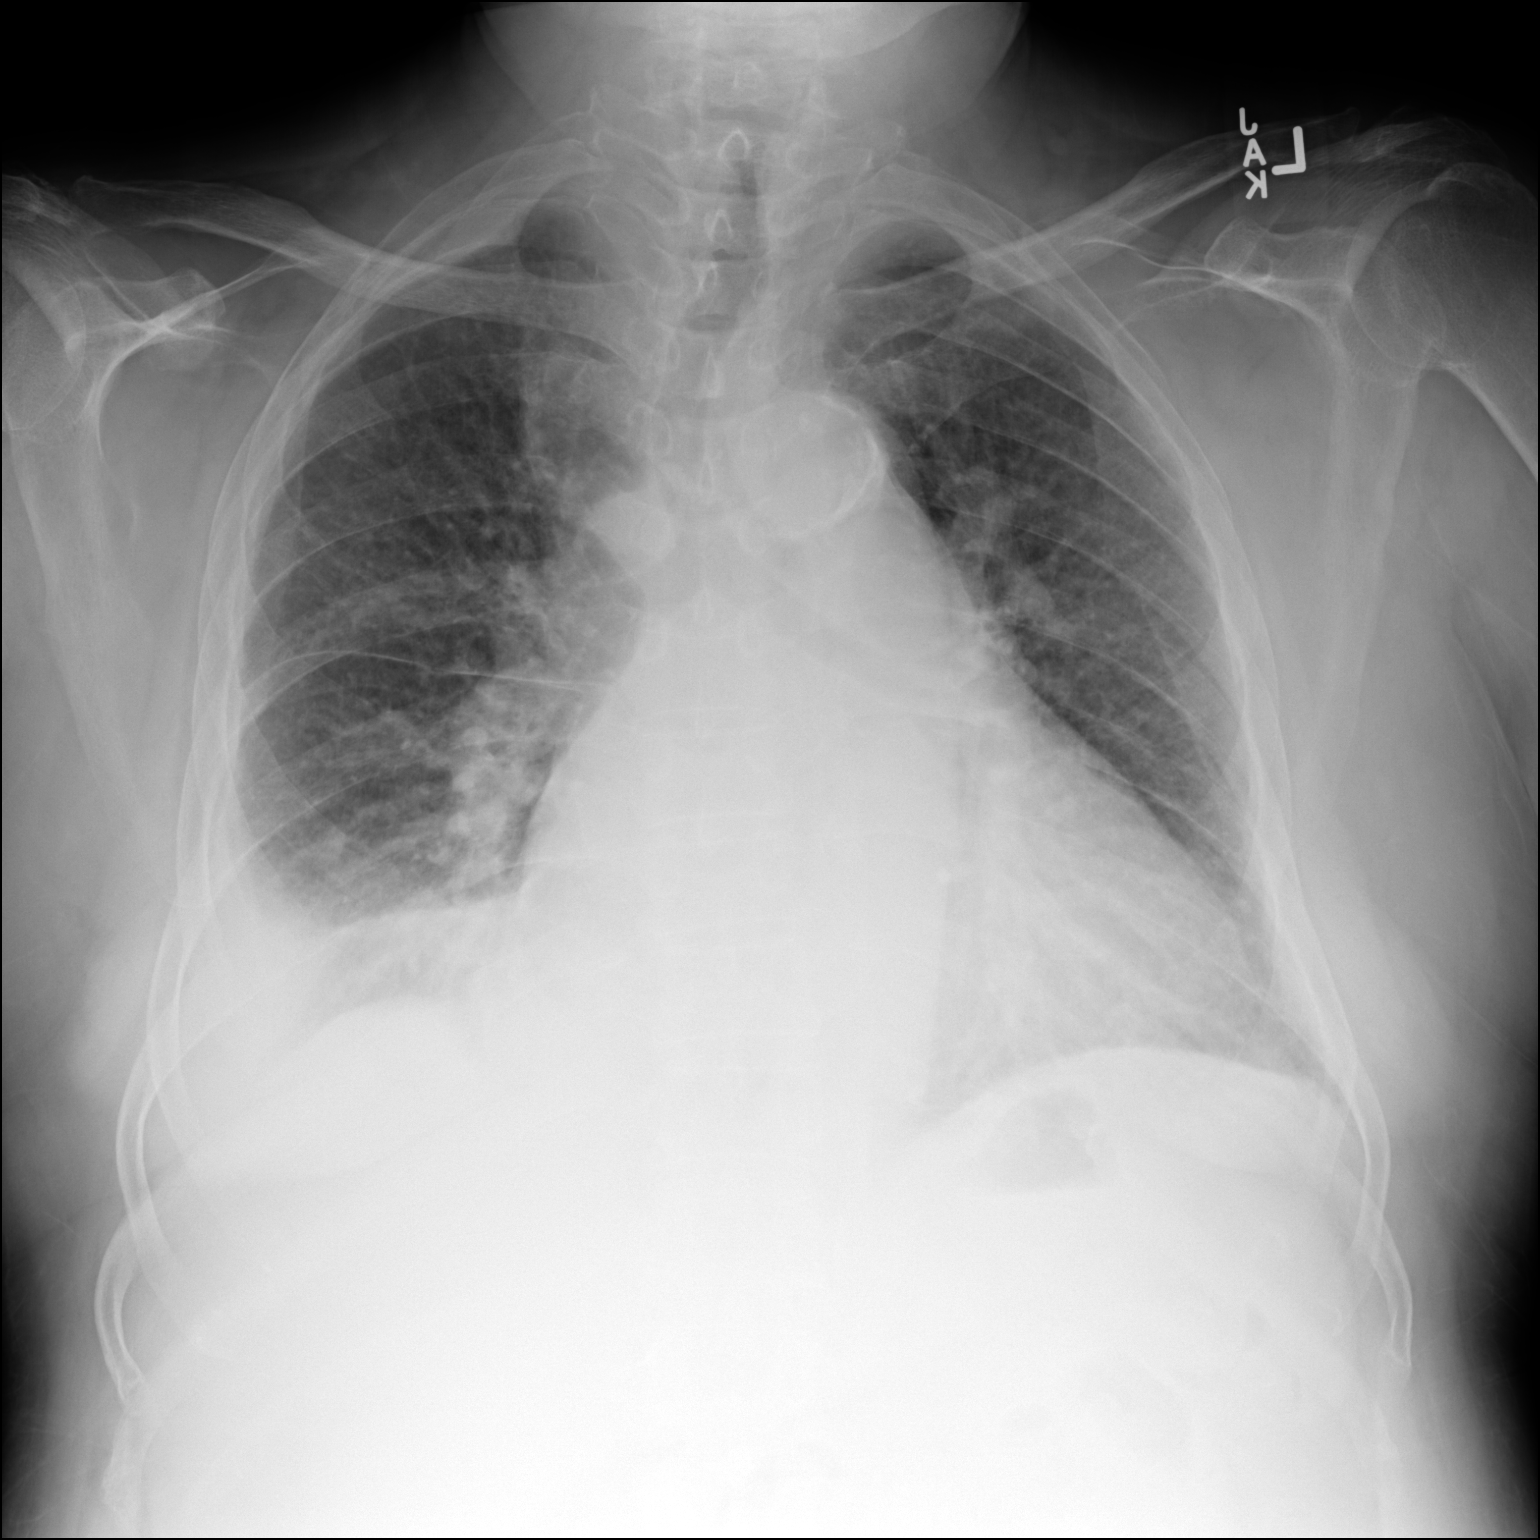

[dg chest 2 view (2 of 2)]
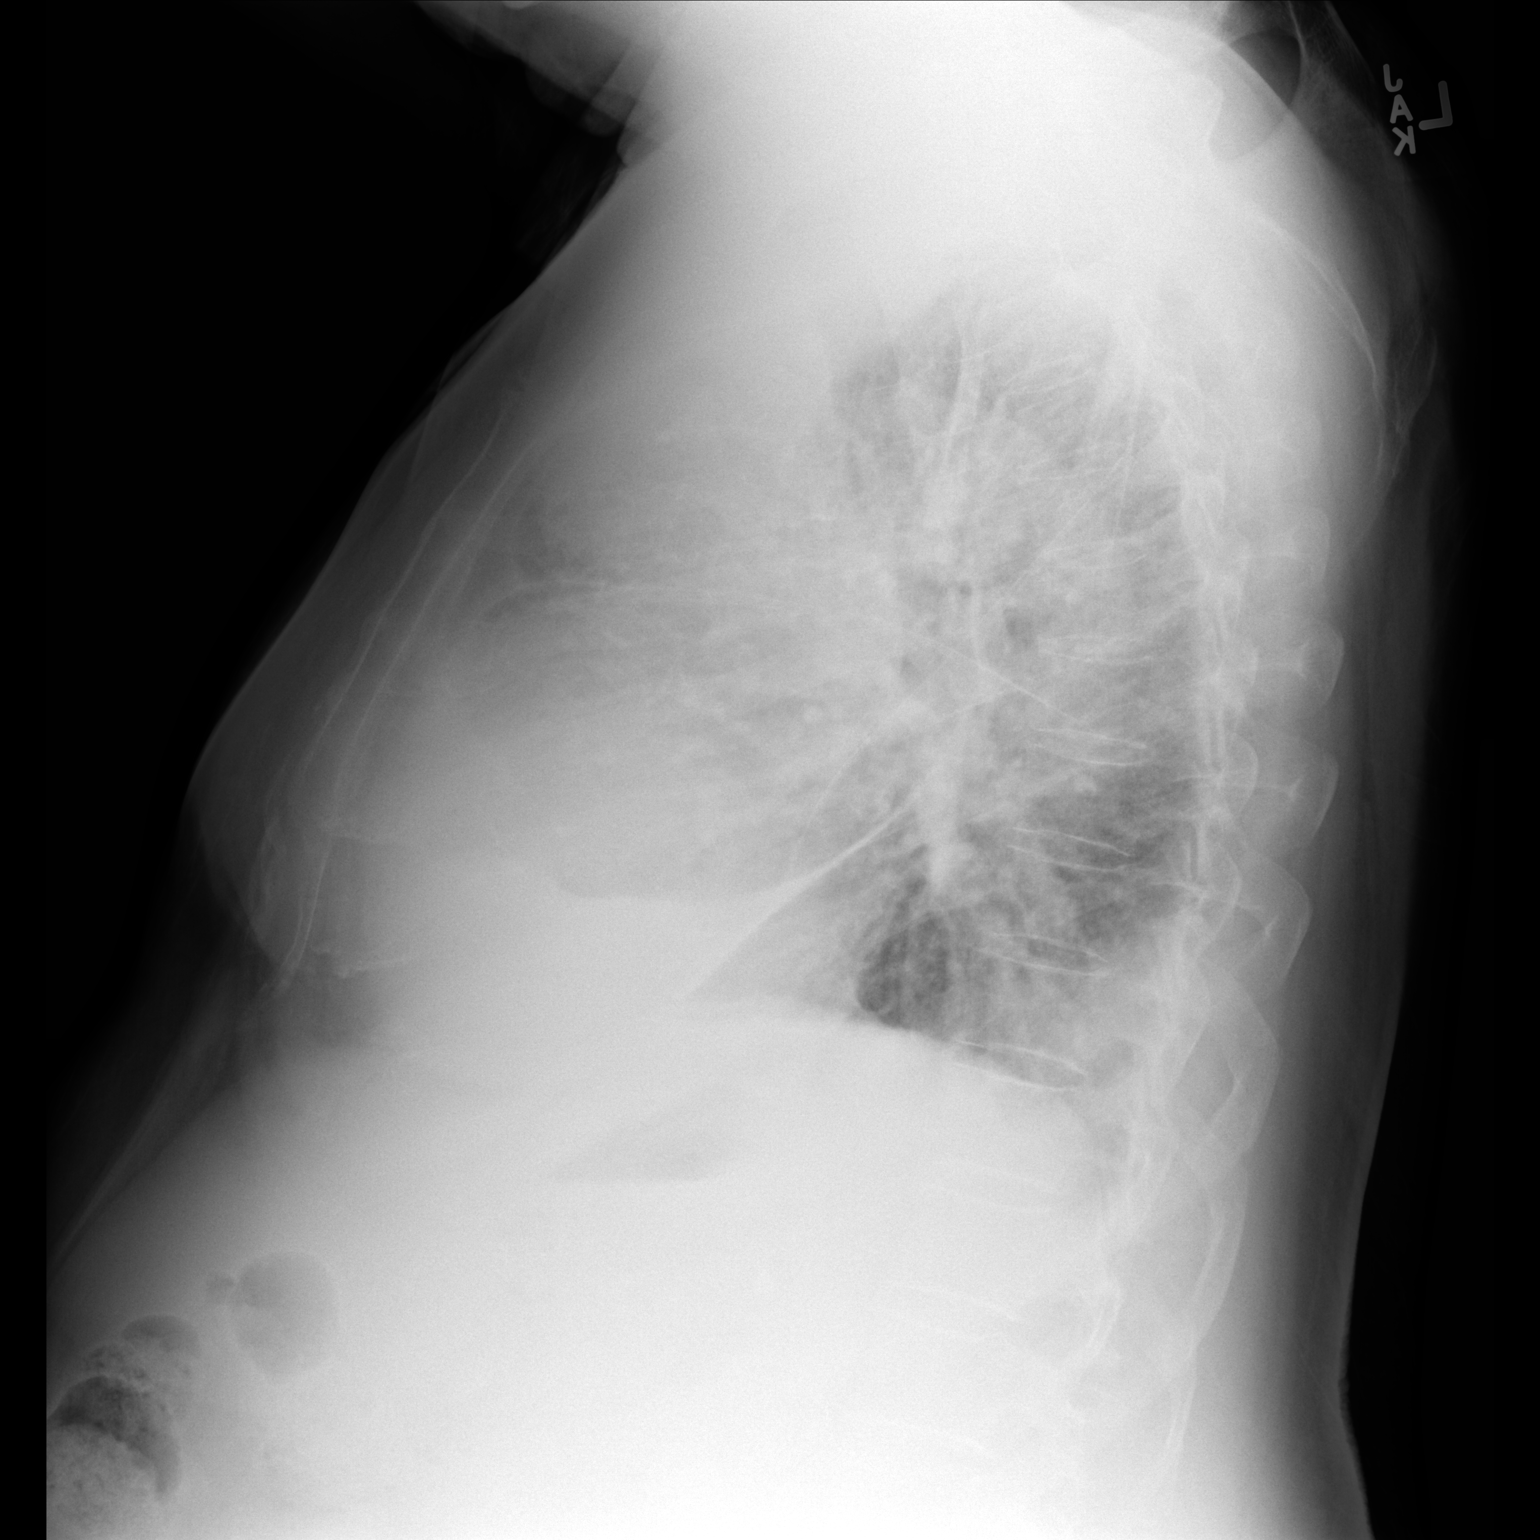

[2 of 2 positions shown; findings below may reference images not displayed]

FINDINGS: Stable cardiomegaly. Atherosclerosis of thoracic aorta is noted. No
pneumothorax is noted. Left lung is clear. Mild right basilar
atelectasis or infiltrate is noted with associated pleural effusion.
Bony thorax is unremarkable.
IMPRESSION: Mild right basilar atelectasis or infiltrate is noted with
associated pleural effusion.

Aortic Atherosclerosis ([id number]).

## 2019-04-13 IMAGING — DX DG CHEST 1V PORT
1 series · 1 of 1 positions shown · non-contrast
Comparison: Most recent chest radiograph 02/04/2018

CLINICAL DATA: Increased shortness of breath tonight.

EXAM:
PORTABLE CHEST 1 VIEW

[chest]
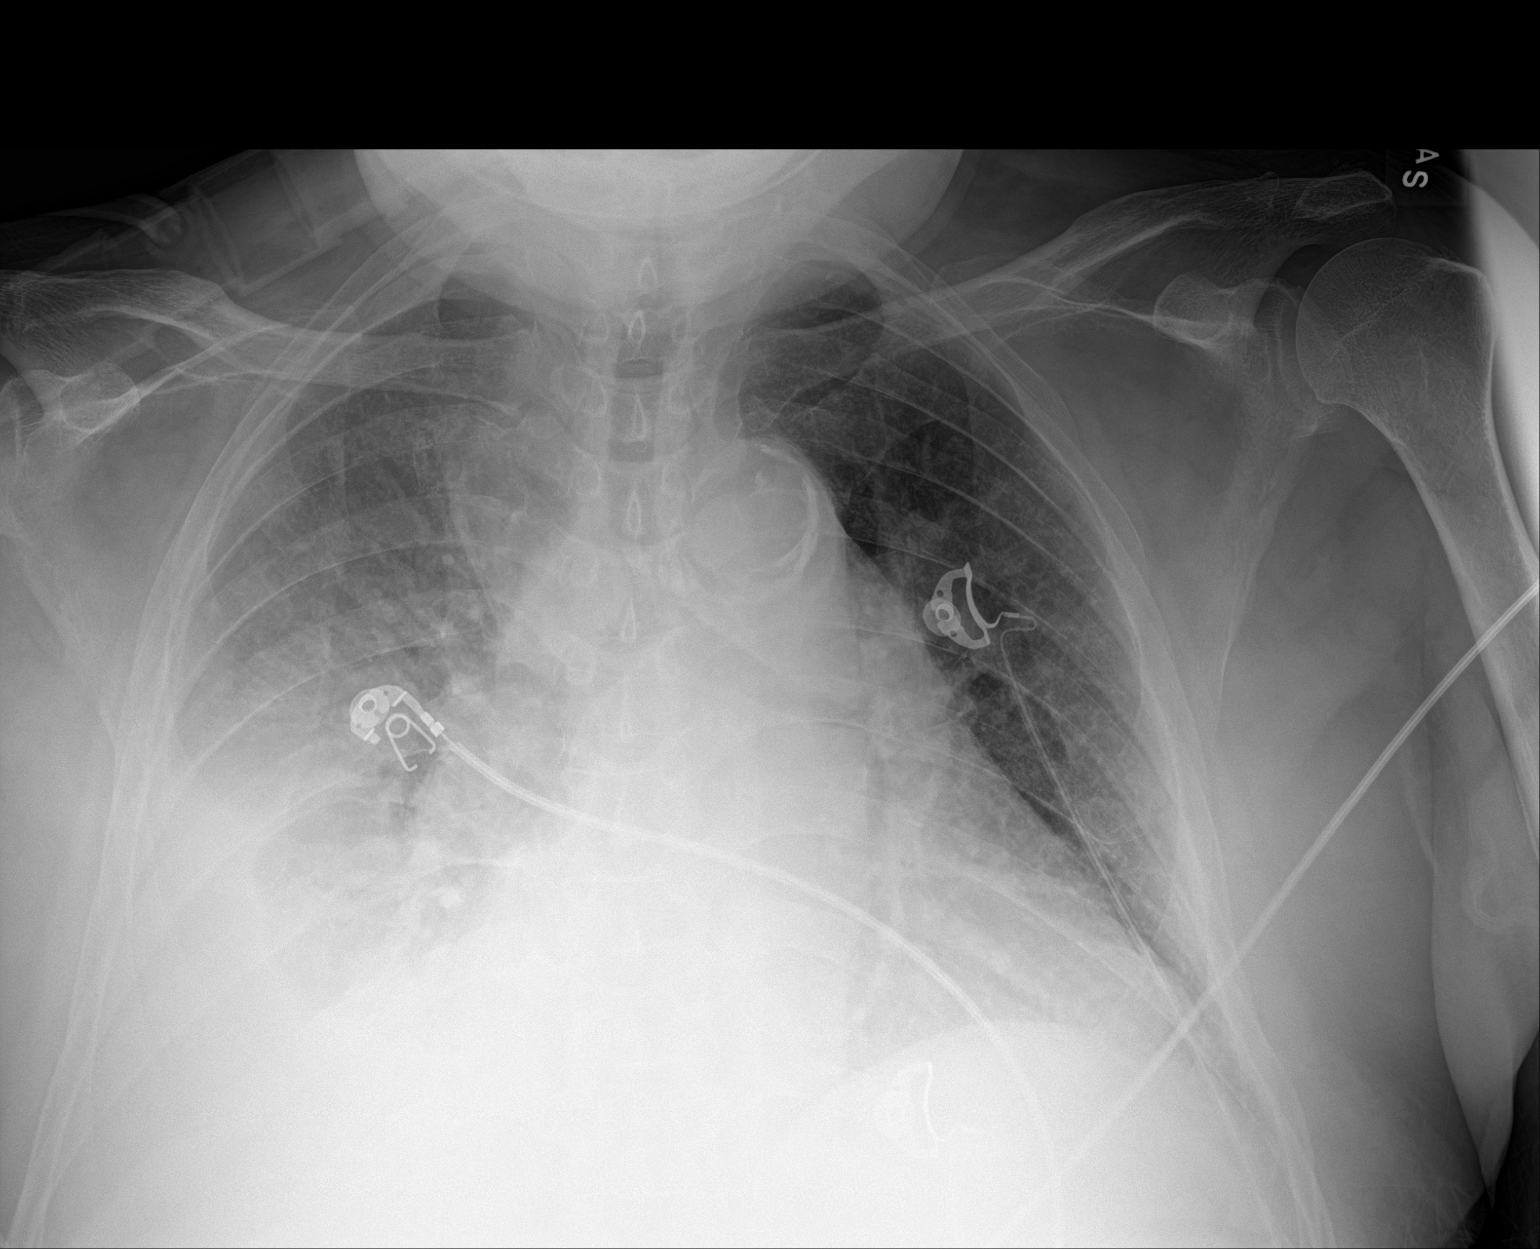

[1 of 1 positions shown; findings below may reference images not displayed]

FINDINGS: Cardiomegaly is similar to prior. Aortic atherosclerosis. Moderate
to large right pleural effusion associated basilar airspace disease,
not significantly changed allowing for differences in technique.
Muscular congestion with mild pulmonary edema, slight progression
from prior. No pneumothorax.
IMPRESSION: Moderate to large right pleural effusion with associated basilar
airspace disease/atelectasis, not significantly changed from
02/04/2018 exam. Slight progression of pulmonary edema.

Stable cardiomegaly.  Aortic Atherosclerosis ([id number]).
